# Patient Record
Sex: Male | Born: 1945 | Race: Black or African American | Hispanic: No | Smoking: Former smoker
Health system: Southern US, Community
[De-identification: ages and names within clinical notes are randomized; demographics above are authoritative.]

## PROBLEM LIST (undated history)

## (undated) ENCOUNTER — Emergency Department (HOSPITAL_COMMUNITY): Admission: EM | Payer: Medicare Other | Source: Home / Self Care

## (undated) DIAGNOSIS — E119 Type 2 diabetes mellitus without complications: Secondary | ICD-10-CM

## (undated) DIAGNOSIS — B1910 Unspecified viral hepatitis B without hepatic coma: Secondary | ICD-10-CM

## (undated) DIAGNOSIS — E785 Hyperlipidemia, unspecified: Secondary | ICD-10-CM

## (undated) DIAGNOSIS — R0600 Dyspnea, unspecified: Secondary | ICD-10-CM

## (undated) DIAGNOSIS — G4730 Sleep apnea, unspecified: Secondary | ICD-10-CM

## (undated) DIAGNOSIS — G47419 Narcolepsy without cataplexy: Secondary | ICD-10-CM

## (undated) DIAGNOSIS — I499 Cardiac arrhythmia, unspecified: Secondary | ICD-10-CM

## (undated) DIAGNOSIS — I10 Essential (primary) hypertension: Secondary | ICD-10-CM

## (undated) DIAGNOSIS — C801 Malignant (primary) neoplasm, unspecified: Secondary | ICD-10-CM

## (undated) HISTORY — DX: Hyperlipidemia, unspecified: E78.5

## (undated) HISTORY — DX: Type 2 diabetes mellitus without complications: E11.9

## (undated) HISTORY — PX: OTHER SURGICAL HISTORY: SHX169

## (undated) HISTORY — DX: Essential (primary) hypertension: I10

---

## 2001-06-10 ENCOUNTER — Emergency Department (HOSPITAL_COMMUNITY): Admission: EM | Admit: 2001-06-10 | Discharge: 2001-06-10 | Payer: Self-pay | Admitting: Emergency Medicine

## 2001-06-10 ENCOUNTER — Encounter: Payer: Self-pay | Admitting: Emergency Medicine

## 2001-09-09 ENCOUNTER — Encounter: Payer: Self-pay | Admitting: Family Medicine

## 2001-09-09 ENCOUNTER — Encounter: Admission: RE | Admit: 2001-09-09 | Discharge: 2001-09-09 | Payer: Self-pay | Admitting: Family Medicine

## 2002-08-18 ENCOUNTER — Ambulatory Visit (HOSPITAL_BASED_OUTPATIENT_CLINIC_OR_DEPARTMENT_OTHER): Admission: RE | Admit: 2002-08-18 | Discharge: 2002-08-18 | Payer: Self-pay | Admitting: Family Medicine

## 2002-09-06 ENCOUNTER — Ambulatory Visit: Admission: RE | Admit: 2002-09-06 | Discharge: 2002-09-06 | Payer: Self-pay | Admitting: Family Medicine

## 2002-11-02 ENCOUNTER — Emergency Department (HOSPITAL_COMMUNITY): Admission: EM | Admit: 2002-11-02 | Discharge: 2002-11-03 | Payer: Self-pay | Admitting: Emergency Medicine

## 2002-11-03 ENCOUNTER — Encounter: Payer: Self-pay | Admitting: Emergency Medicine

## 2003-08-04 ENCOUNTER — Ambulatory Visit (HOSPITAL_BASED_OUTPATIENT_CLINIC_OR_DEPARTMENT_OTHER): Admission: RE | Admit: 2003-08-04 | Discharge: 2003-08-04 | Payer: Self-pay | Admitting: Neurology

## 2003-08-25 ENCOUNTER — Ambulatory Visit (HOSPITAL_COMMUNITY): Admission: RE | Admit: 2003-08-25 | Discharge: 2003-08-25 | Payer: Self-pay | Admitting: Gastroenterology

## 2003-08-25 ENCOUNTER — Encounter (INDEPENDENT_AMBULATORY_CARE_PROVIDER_SITE_OTHER): Payer: Self-pay | Admitting: *Deleted

## 2005-02-02 ENCOUNTER — Encounter: Admission: RE | Admit: 2005-02-02 | Discharge: 2005-02-02 | Payer: Self-pay | Admitting: Neurology

## 2008-10-18 ENCOUNTER — Encounter: Admission: RE | Admit: 2008-10-18 | Discharge: 2008-11-02 | Payer: Self-pay | Admitting: Family Medicine

## 2010-11-10 ENCOUNTER — Other Ambulatory Visit: Payer: Self-pay | Admitting: Gastroenterology

## 2011-01-05 NOTE — Op Note (Signed)
NAME:  [name], [name]                     ACCOUNT NO.:  [id number]   MEDICAL RECORD NO.:  [id number]                   PATIENT TYPE:  AMB   LOCATION:  ENDO                                 FACILITY:  [facility]   PHYSICIAN:  Petra Kuba, M.D.                 DATE OF BIRTH:  1945-10-13   DATE OF PROCEDURE:  08/25/2003  DATE OF DISCHARGE:                                 OPERATIVE REPORT   PROCEDURE PERFORMED:  Colonoscopy with hot biopsy.   ENDOSCOPIST:  Petra Kuba, M.D.   INDICATIONS FOR PROCEDURE:  Patient with history of colon polyps, due for  colon screening.  Consent was signed after the risks, benefits, methods and  options were thoroughly discussed multiple times in the past.   MEDICINES USED:  Demerol 75 mg, Versed 6 mg.   DESCRIPTION OF PROCEDURE:  Rectal inspection was pertinent for external  hemorrhoids, small.  Digital exam was negative.  A video colonoscope was  inserted.  The left side prep was adequate.  The right side prep was fairly  poor and did require about 1.6 L of washing and suctioning for adequate  visualization.  Although there was multiple  stool adherent to the wall,  most could be washed away, but not all.  On insertion in the midtransverse,  a small polyp was seen and was hot biopsied times one.  No other  abnormalities were seen as we advanced to the level of the ileocecal valve.  This required just abdominal pressure; however, to advance to the cecal pole  required rolling him on his back and then rolling him on his right side with  various abdominal pressures.  The cecum was identified by the appendiceal  orifice and the ileocecal valve.  The scope was slowly withdrawn.  Again,  prep on the right side was not as good as the left but with lots of washing  and suctioning, no obvious large lesion was missed.  Certainly a tiny polyp  could have been.  As the scope was slowly withdrawn, the previously hot  biopsied polypectomy site was seen.  It had a  nice white coagulum without  any bleeding or obvious residual polyp.  Scope was slowly withdrawn.  There  was some left-sided diverticula but no other abnormalities as we slowly  withdrew back to the rectum.  Anorectal pull through and retroflexion  confirmed the hemorrhoids.  The scope was reinserted a short ways up the  left side of the colon, air was suctioned, scope removed.  The patient  tolerated the procedure well.  There was no immediate obvious complication.   ENDOSCOPIC DIAGNOSIS:  1. Internal and external hemorrhoids.  2. Left-sided diverticula.  3. Small midtransverse polyp hot biopsied.  4. Otherwise within normal limits to the cecum.   PLAN:  Await pathology, probably recheck colon screening in five years.  Would use a more aggressive prep at that time.  Otherwise return care  to Dr.  Parke Simmers for the customary health care maintenance to include yearly rectals  and guaiacs.                                               Petra Kuba, M.D.    MEM/MEDQ  D:  08/25/2003  T:  08/25/2003  Job:  [id number]   cc:   Renaye Rakers, M.D.  [phone] [address]  [place]  [place] [address]  Fax: [phone]   Clinton D. Young, M.D.  [address] [place]  [place] [address]  Fax: [phone]

## 2011-08-03 ENCOUNTER — Ambulatory Visit (INDEPENDENT_AMBULATORY_CARE_PROVIDER_SITE_OTHER): Payer: MEDICARE | Admitting: Family Medicine

## 2011-08-03 DIAGNOSIS — I10 Essential (primary) hypertension: Secondary | ICD-10-CM

## 2011-08-03 DIAGNOSIS — Z23 Encounter for immunization: Secondary | ICD-10-CM

## 2011-08-08 ENCOUNTER — Ambulatory Visit (INDEPENDENT_AMBULATORY_CARE_PROVIDER_SITE_OTHER): Payer: MEDICARE

## 2011-08-08 DIAGNOSIS — L0211 Cutaneous abscess of neck: Secondary | ICD-10-CM

## 2011-08-08 DIAGNOSIS — M79609 Pain in unspecified limb: Secondary | ICD-10-CM

## 2011-08-10 ENCOUNTER — Ambulatory Visit (INDEPENDENT_AMBULATORY_CARE_PROVIDER_SITE_OTHER): Payer: MEDICARE

## 2011-08-10 DIAGNOSIS — L02219 Cutaneous abscess of trunk, unspecified: Secondary | ICD-10-CM

## 2011-08-12 ENCOUNTER — Ambulatory Visit (INDEPENDENT_AMBULATORY_CARE_PROVIDER_SITE_OTHER): Payer: MEDICARE

## 2011-08-12 DIAGNOSIS — L0211 Cutaneous abscess of neck: Secondary | ICD-10-CM

## 2011-08-12 DIAGNOSIS — L03221 Cellulitis of neck: Secondary | ICD-10-CM

## 2011-08-15 ENCOUNTER — Ambulatory Visit (INDEPENDENT_AMBULATORY_CARE_PROVIDER_SITE_OTHER): Payer: MEDICARE

## 2011-08-15 DIAGNOSIS — L0211 Cutaneous abscess of neck: Secondary | ICD-10-CM

## 2011-08-17 ENCOUNTER — Ambulatory Visit (INDEPENDENT_AMBULATORY_CARE_PROVIDER_SITE_OTHER): Payer: MEDICARE

## 2011-08-17 DIAGNOSIS — L0211 Cutaneous abscess of neck: Secondary | ICD-10-CM

## 2011-08-19 ENCOUNTER — Ambulatory Visit (INDEPENDENT_AMBULATORY_CARE_PROVIDER_SITE_OTHER): Payer: MEDICARE

## 2011-08-19 DIAGNOSIS — L0211 Cutaneous abscess of neck: Secondary | ICD-10-CM

## 2011-09-06 ENCOUNTER — Encounter: Payer: Self-pay | Admitting: Family Medicine

## 2011-09-06 DIAGNOSIS — E119 Type 2 diabetes mellitus without complications: Secondary | ICD-10-CM | POA: Insufficient documentation

## 2011-09-06 DIAGNOSIS — N400 Enlarged prostate without lower urinary tract symptoms: Secondary | ICD-10-CM

## 2011-09-06 DIAGNOSIS — G47419 Narcolepsy without cataplexy: Secondary | ICD-10-CM | POA: Insufficient documentation

## 2011-09-06 DIAGNOSIS — IMO0001 Reserved for inherently not codable concepts without codable children: Secondary | ICD-10-CM

## 2011-09-06 DIAGNOSIS — K5730 Diverticulosis of large intestine without perforation or abscess without bleeding: Secondary | ICD-10-CM | POA: Insufficient documentation

## 2011-10-08 LAB — HM DIABETES EYE EXAM

## 2011-10-24 ENCOUNTER — Other Ambulatory Visit: Payer: Self-pay | Admitting: Family Medicine

## 2011-10-24 MED ORDER — METFORMIN HCL 1000 MG PO TABS: ORAL_TABLET | ORAL | Status: DC

## 2011-11-02 ENCOUNTER — Encounter: Payer: Self-pay | Admitting: Family Medicine

## 2011-11-02 ENCOUNTER — Ambulatory Visit (INDEPENDENT_AMBULATORY_CARE_PROVIDER_SITE_OTHER): Payer: MEDICARE | Admitting: Family Medicine

## 2011-11-02 VITALS — BP 107/71 | HR 106 | Temp 97.5°F | Resp 16 | Ht 68.5 in | Wt 209.0 lb

## 2011-11-02 DIAGNOSIS — E119 Type 2 diabetes mellitus without complications: Secondary | ICD-10-CM

## 2011-11-02 DIAGNOSIS — IMO0002 Reserved for concepts with insufficient information to code with codable children: Secondary | ICD-10-CM

## 2011-11-02 DIAGNOSIS — I10 Essential (primary) hypertension: Secondary | ICD-10-CM

## 2011-11-02 DIAGNOSIS — E1165 Type 2 diabetes mellitus with hyperglycemia: Secondary | ICD-10-CM

## 2011-11-02 DIAGNOSIS — IMO0001 Reserved for inherently not codable concepts without codable children: Secondary | ICD-10-CM

## 2011-11-02 LAB — POCT GLYCOSYLATED HEMOGLOBIN (HGB A1C): Hemoglobin A1C: 7.3

## 2011-11-02 LAB — GLUCOSE, POCT (MANUAL RESULT ENTRY): POC Glucose: 160

## 2011-11-02 MED ORDER — AMLODIPINE BESY-BENAZEPRIL HCL 5-10 MG PO CAPS: 1.0000 | ORAL_CAPSULE | Freq: Every day | ORAL | Status: DC

## 2011-11-02 MED ORDER — HYDROCHLOROTHIAZIDE 25 MG PO TABS: 25.0000 mg | ORAL_TABLET | Freq: Every day | ORAL | Status: DC

## 2011-11-02 MED ORDER — PRAVASTATIN SODIUM 20 MG PO TABS: 20.0000 mg | ORAL_TABLET | Freq: Every day | ORAL | Status: DC

## 2011-11-02 MED ORDER — METFORMIN HCL 1000 MG PO TABS: 1000.0000 mg | ORAL_TABLET | Freq: Two times a day (BID) | ORAL | Status: DC

## 2011-11-02 MED ORDER — SPIRONOLACTONE 25 MG PO TABS: 25.0000 mg | ORAL_TABLET | Freq: Two times a day (BID) | ORAL | Status: DC

## 2011-11-02 NOTE — Progress Notes (Signed)
  Subjective:    Patient ID: [name], male    DOB: 09-14-1945, 66 y.o.   MRN: [id number]  HPI [name] is a 66 y.o. male here for f/u on meds, Hx narcolepsy, HTN, DM2   DM2 - last A1c 7.9 08/03/11.  Metformin increased to 500mg  qam, 1 gram qpm.  Microalbumin wnl last ov.  Rarely checking home blood sugars.  Fasting 103-127.  No new side effects with meds, no symptomatic low cbg's  Htn - outside bp's 127/78-81 No new side effects with meds.   Review of Systems  Constitutional: Negative for fatigue and unexpected weight change.  Eyes: Negative for visual disturbance.  Respiratory: Negative for cough, chest tightness and shortness of breath.   Cardiovascular: Negative for chest pain, palpitations and leg swelling.  Gastrointestinal: Negative for abdominal pain and blood in stool.  Neurological: Negative for dizziness, light-headedness and headaches.      not fasting today. Objective:   Physical Exam  Constitutional: He is oriented to person, place, and time. He appears well-developed and well-nourished.  HENT:  Head: Normocephalic and atraumatic.  Eyes: EOM are normal. Pupils are equal, round, and reactive to light.  Neck: No JVD present. Carotid bruit is not present. No thyromegaly present.  Cardiovascular: Normal rate, regular rhythm and normal heart sounds.   No murmur heard. Pulmonary/Chest: Effort normal and breath sounds normal. He has no rales.  Abdominal: Soft. He exhibits no distension, no abdominal bruit and no pulsatile midline mass.  Musculoskeletal: He exhibits no edema.  Neurological: He is alert and oriented to person, place, and time.  Skin: Skin is warm and dry. No rash noted.  Psychiatric: He has a normal mood and affect.   Microfilament testing wnl.  Results for orders placed in visit on 11/02/11  POCT GLYCOSYLATED HEMOGLOBIN (HGB A1C)      Component Value Range   Hemoglobin A1C 7.3    GLUCOSE, POCT (MANUAL RESULT ENTRY)      Component  Value Range   POC Glucose 160          Assessment & Plan:  [name] is a 67 y.o. male 1. HTN (hypertension)  Basic metabolic panel  2. DM2 (diabetes mellitus, type 2)  POCT HgB A1C, POCT glucose (manual entry)  3. Diabetes type 2, uncontrolled     HTN - controlled, cont same meds. Refills given.  Work on diet and weight loss.  DM2- uncontrolled but improving on new regimen. Discussed med options - will increase metformin to 1 gram BID, and recheck HGB A1c in 3 months.  Follow home blood sugars, plans on obtaining meter through [employer] to save cost.  Discussed continued attention to diet choices and exercise for weight loss. Recheck in 3 months.

## 2011-11-03 LAB — BASIC METABOLIC PANEL
BUN: 15 mg/dL (ref 6–23)
CO2: 27 mEq/L (ref 19–32)
Calcium: 9.2 mg/dL (ref 8.4–10.5)
Chloride: 101 mEq/L (ref 96–112)
Creat: 1.15 mg/dL (ref 0.50–1.35)
Glucose, Bld: 136 mg/dL — ABNORMAL HIGH (ref 70–99)
Potassium: 4.3 mEq/L (ref 3.5–5.3)
Sodium: 139 mEq/L (ref 135–145)

## 2011-12-12 DIAGNOSIS — G259 Extrapyramidal and movement disorder, unspecified: Secondary | ICD-10-CM | POA: Insufficient documentation

## 2012-02-18 ENCOUNTER — Telehealth: Payer: Self-pay

## 2012-02-18 NOTE — Telephone Encounter (Signed)
DR. Neva Seat Please call Dr. Egbert Garibaldi regarding patient (medications - specifically a steroid to be considered) Phone [phone]

## 2012-02-18 NOTE — Telephone Encounter (Signed)
patient notified and voiced understanding. He said he will come in tomorrow morning.

## 2012-02-18 NOTE — Telephone Encounter (Signed)
Discussed with Dr. Egbert Garibaldi - patient sent to his office from dentist due to face swelling.  Apparently has had other episodes in past year.  Thought to be angioedema, not tooth related.  Advised to stop lotrel tonight and follow up with me in am.   Please call patient - advise that his face swelling sounds like angioedema, likely a reaction to his medicine with benazepril in it. Stop Lotrel today, can take benadryl over the counter every 4 to 6 hours today and tonight, and recheck in office tomorrow morning with me for recheck.  Can rtc tonight or to ER overnight if any throat symptoms, shortness of breath or any other worsening.

## 2012-02-18 NOTE — Telephone Encounter (Signed)
Called Dr Clemetine Marker (oral surgeon) office and asked for details about ?s for Dr Neva Seat. Spoke w/Brandon who checked pt's chart and stated that pt has a badly infected tooth that it looks like Dr Egbert Garibaldi wants to reduce the infection in bf extracting tooth. Apolinar Junes stated it would be best if Dr Neva Seat could call and discuss w/Dr Egbert Garibaldi if possible after 2 pm today.

## 2012-02-19 ENCOUNTER — Ambulatory Visit (INDEPENDENT_AMBULATORY_CARE_PROVIDER_SITE_OTHER): Payer: MEDICARE | Admitting: Family Medicine

## 2012-02-19 VITALS — BP 155/85 | HR 90 | Temp 98.2°F | Resp 17 | Ht 69.0 in | Wt 204.0 lb

## 2012-02-19 DIAGNOSIS — T783XXA Angioneurotic edema, initial encounter: Secondary | ICD-10-CM

## 2012-02-19 DIAGNOSIS — I10 Essential (primary) hypertension: Secondary | ICD-10-CM

## 2012-02-19 MED ORDER — AMLODIPINE BESYLATE 10 MG PO TABS: 10.0000 mg | ORAL_TABLET | Freq: Every day | ORAL | Status: DC

## 2012-02-19 NOTE — Progress Notes (Signed)
  Subjective:    Patient ID: [name], male    DOB: 1946-05-02, 66 y.o.   MRN: [id number]  HPI [name] is a 65 y.o. male Hx HTn, DM.  See phone note yesterday - called by patient's oral surgeon - Dr. Remigio Eisenmenger.  Swelling of face.mouth - thought to be due to angioedema.  Instructed to d/c ace inhibitor Lotrel 5/10 - instructed to d/c this.   By history - patient has had similar episodes in past - R face with swelling into lower lip.  Stopped amlodipine/benazepril yesterday (last dose yesterday am).   No shortness of breath, no throat symptoms.  Swelling has subsided - only a little bit left, no benadryl needed.   Review of Systems  HENT: Negative for sore throat and trouble swallowing.   Respiratory: Negative for cough, choking and shortness of breath.   Skin:       R face swelling.   Neurological: Negative for dizziness and light-headedness.       Objective:   Physical Exam  Constitutional: He is oriented to person, place, and time. He appears well-developed and well-nourished.  HENT:  Head: Normocephalic and atraumatic.    Eyes: EOM are normal. Pupils are equal, round, and reactive to light.  Neck: No JVD present. Carotid bruit is not present. No tracheal deviation present.  Cardiovascular: Normal rate, regular rhythm and normal heart sounds.   No murmur heard. Pulmonary/Chest: Effort normal and breath sounds normal. No stridor. No respiratory distress. He has no wheezes. He has no rales.  Musculoskeletal: He exhibits no edema.  Neurological: He is alert and oriented to person, place, and time.  Skin: Skin is warm and dry.  Psychiatric: He has a normal mood and affect.       Assessment & Plan:  [name] is a 66 y.o. male 1. HTN (hypertension)   2. Angioedema    Stop benazepril.  Increase amlodipine to 10mg .  Advised of allergy now to ACE inhibitors. Patient Instructions  Stopping benazepril as you likely had an allergic reaction called  angioedema.  You can not take any "Ace inhibitors" in the future due to this.  We will continue amlodipine (one of the medicines in the combination pill you took prior) but will increase the dose to 10mg .  If any lightheadedness or dizziness, return to clinic.  Keep your follow up appointment on the 12th.  Return to the clinic or go to the nearest emergency room if any of your symptoms worsen or new symptoms occur.

## 2012-02-19 NOTE — Patient Instructions (Signed)
Stopping benazepril as you likely had an allergic reaction called angioedema.  You can not take any "Ace inhibitors" in the future due to this.  We will continue amlodipine (one of the medicines in the combination pill you took prior) but will increase the dose to 10mg .  If any lightheadedness or dizziness, return to clinic.  Keep your follow up appointment on the 12th.  Return to the clinic or go to the nearest emergency room if any of your symptoms worsen or new symptoms occur.

## 2012-02-29 ENCOUNTER — Ambulatory Visit (INDEPENDENT_AMBULATORY_CARE_PROVIDER_SITE_OTHER): Payer: MEDICARE | Admitting: Family Medicine

## 2012-02-29 ENCOUNTER — Encounter: Payer: Self-pay | Admitting: Family Medicine

## 2012-02-29 VITALS — BP 132/82 | HR 113 | Temp 97.3°F | Resp 22 | Ht 69.0 in | Wt 203.0 lb

## 2012-02-29 DIAGNOSIS — I10 Essential (primary) hypertension: Secondary | ICD-10-CM

## 2012-02-29 DIAGNOSIS — IMO0001 Reserved for inherently not codable concepts without codable children: Secondary | ICD-10-CM

## 2012-02-29 DIAGNOSIS — E119 Type 2 diabetes mellitus without complications: Secondary | ICD-10-CM

## 2012-02-29 LAB — BASIC METABOLIC PANEL
BUN: 19 mg/dL (ref 6–23)
CO2: 23 mEq/L (ref 19–32)
Calcium: 9.5 mg/dL (ref 8.4–10.5)
Chloride: 101 mEq/L (ref 96–112)
Creat: 1.27 mg/dL (ref 0.50–1.35)
Glucose, Bld: 125 mg/dL — ABNORMAL HIGH (ref 70–99)
Potassium: 4.4 mEq/L (ref 3.5–5.3)
Sodium: 138 mEq/L (ref 135–145)

## 2012-02-29 LAB — GLUCOSE, POCT (MANUAL RESULT ENTRY): POC Glucose: 143 mg/dl — AB (ref 70–99)

## 2012-02-29 LAB — POCT GLYCOSYLATED HEMOGLOBIN (HGB A1C): Hemoglobin A1C: 6.5

## 2012-02-29 MED ORDER — LOSARTAN POTASSIUM 25 MG PO TABS: 25.0000 mg | ORAL_TABLET | Freq: Every day | ORAL | Status: DC

## 2012-02-29 NOTE — Patient Instructions (Signed)
Keep a record of your blood pressures outside of the office and bring them to the next office visit. Return to the clinic or go to the nearest emergency room if any of your symptoms worsen or new symptoms occur.

## 2012-02-29 NOTE — Progress Notes (Signed)
  Subjective:    Patient ID: [name], male    DOB: [birth year], 66 y.o.   MRN: [id number]  HPI [name] is a 66 y.o. male  DM2 - A1c 7.3 in March.  Increased metformin to 1 gram BID. cbg's 110-135. No new side effects with meds.  Did have cramp in leg few months ago.  HTN - with recent diagnosis of angioedema - ace inhibitor discontinued 7/1 with OV 7/2 - improving then. No further face or lip swelling.  No outside BP readings.    Review of Systems  Constitutional: Negative for fatigue and unexpected weight change.  Eyes: Negative for visual disturbance.  Respiratory: Negative for cough, chest tightness and shortness of breath.   Cardiovascular: Negative for chest pain, palpitations and leg swelling.  Gastrointestinal: Negative for abdominal pain and blood in stool.  Neurological: Negative for dizziness, light-headedness and headaches.       Objective:   Physical Exam  Constitutional: He is oriented to person, place, and time. He appears well-developed and well-nourished.  HENT:  Head: Normocephalic and atraumatic.       No lip or face swelling.   Eyes: Pupils are equal, round, and reactive to light.  Cardiovascular: Normal rate, regular rhythm, normal heart sounds and intact distal pulses.   Pulmonary/Chest: Effort normal and breath sounds normal.  Abdominal: Soft. There is no tenderness.  Neurological: He is alert and oriented to person, place, and time.       Microfilament testing of feet normal bilaterally.  Skin: Skin is warm, dry and intact. No rash noted.  Psychiatric: He has a normal mood and affect. His behavior is normal.    Results for orders placed in visit on 02/29/12  GLUCOSE, POCT (MANUAL RESULT ENTRY)      Component Value Range   POC Glucose 143 (*) 70 - 99 mg/dl  POCT GLYCOSYLATED HEMOGLOBIN (HGB A1C)      Component Value Range   Hemoglobin A1C 6.5           Assessment & Plan:  [name] is a 66 y.o. male 1. Diabetes  type 2, controlled  POCT glucose (manual entry), POCT glycosylated hemoglobin (Hb A1C)  2. HTN (hypertension)  Basic metabolic panel   Hx of ACe - inhibitor angioedema, will avoid this class, but can try ARB as diabetic for nephroprotection.  Check urine micralbumin, Start cozaar 25mg  qd, watch outside bp's, orhtostatic precautions.  Cautioned about possible cross reactivity of ARB - if any return of face or lip swelling - stop cozaar right away and rtc.  DM2 - controlled. Continue same doses of meds - ok to refill x 3 months if needed. no refills needed today per pt.

## 2012-03-01 LAB — MICROALBUMIN, URINE: Microalb, Ur: 1.31 mg/dL (ref 0.00–1.89)

## 2012-03-13 ENCOUNTER — Encounter: Payer: Self-pay | Admitting: Family Medicine

## 2012-03-13 DIAGNOSIS — G4733 Obstructive sleep apnea (adult) (pediatric): Secondary | ICD-10-CM | POA: Insufficient documentation

## 2012-05-30 ENCOUNTER — Telehealth: Payer: Self-pay

## 2012-05-30 MED ORDER — METFORMIN HCL 1000 MG PO TABS: 1000.0000 mg | ORAL_TABLET | Freq: Two times a day (BID) | ORAL | Status: DC

## 2012-05-30 MED ORDER — PRAVASTATIN SODIUM 20 MG PO TABS: 20.0000 mg | ORAL_TABLET | Freq: Every day | ORAL | Status: DC

## 2012-05-30 MED ORDER — SPIRONOLACTONE 25 MG PO TABS: 25.0000 mg | ORAL_TABLET | Freq: Two times a day (BID) | ORAL | Status: DC

## 2012-05-30 NOTE — Telephone Encounter (Signed)
Pt originally had appt w/dr greene today, but did not get message that appt had changed. He needs refills on spironolactone 25 mg, metformin 1000 mg, and pravastatin 20mg .  Made another appt for mon, but is out of meds today.

## 2012-05-30 NOTE — Telephone Encounter (Signed)
Please call patient and inform him that I have sent in 90 days of his medications, but he needs to keep his appointment with Dr. Neva Seat on 06/02/12.

## 2012-05-31 NOTE — Telephone Encounter (Signed)
Patient notified and stated he will be at his appt.

## 2012-06-02 ENCOUNTER — Ambulatory Visit (INDEPENDENT_AMBULATORY_CARE_PROVIDER_SITE_OTHER): Payer: MEDICARE | Admitting: Family Medicine

## 2012-06-02 ENCOUNTER — Encounter: Payer: Self-pay | Admitting: Family Medicine

## 2012-06-02 VITALS — BP 108/78 | HR 112 | Temp 98.1°F | Resp 16 | Ht 68.5 in | Wt 214.8 lb

## 2012-06-02 DIAGNOSIS — Z23 Encounter for immunization: Secondary | ICD-10-CM

## 2012-06-02 DIAGNOSIS — E119 Type 2 diabetes mellitus without complications: Secondary | ICD-10-CM

## 2012-06-02 DIAGNOSIS — I10 Essential (primary) hypertension: Secondary | ICD-10-CM

## 2012-06-02 LAB — POCT GLYCOSYLATED HEMOGLOBIN (HGB A1C): Hemoglobin A1C: 6.8

## 2012-06-02 LAB — GLUCOSE, POCT (MANUAL RESULT ENTRY): POC Glucose: 97 mg/dl (ref 70–99)

## 2012-06-02 MED ORDER — METFORMIN HCL 1000 MG PO TABS: 1000.0000 mg | ORAL_TABLET | Freq: Two times a day (BID) | ORAL | Status: DC

## 2012-06-02 MED ORDER — PRAVASTATIN SODIUM 20 MG PO TABS: 20.0000 mg | ORAL_TABLET | Freq: Every day | ORAL | Status: DC

## 2012-06-02 MED ORDER — SPIRONOLACTONE 25 MG PO TABS: 25.0000 mg | ORAL_TABLET | Freq: Two times a day (BID) | ORAL | Status: DC

## 2012-06-02 MED ORDER — HYDROCHLOROTHIAZIDE 12.5 MG PO TABS: 12.5000 mg | ORAL_TABLET | Freq: Every day | ORAL | Status: DC

## 2012-06-02 NOTE — Patient Instructions (Signed)
Keep a record of your blood pressures outside of the office and bring them to the next office visit. We decreased the hydrochlorothiazide to 12.5mg  each day. (you can split the 25mg  dose in half until this new prescription is filled). [facility] for your shingles vaccine - bring the prescription.  Recheck in 3 months. Your should receive a call or letter about your lab results within the next week to 10 days.

## 2012-06-02 NOTE — Progress Notes (Signed)
Subjective:    Patient ID: [name], male    DOB: 29-Mar-1946, 66 y.o.   MRN: [id number]  HPI [name] is a 66 y.o. male  Phone note reviewed from 05/30/12 - metformin, pravastatin and spirinolactone refilled x 90days.   HTN - with hx of ACE - inhibitor angioedema, Started cozaar 25mg  qd at 02/29/12 ov. Creatinine 1.27 then. Also takes spirinolactone 25mg  bid, hctz 25mg  qd, norvasc 10mg  qd.  No further face swelling.  Outside BP: 105-110/68-75.    DM2 - controlled at last ov with A1c 6.5 last ov. Continued metformin 1000mg  BID. Home blood sugars - 150's on average. Lowest  98, no symptomatic lows. Dentist appt in next week. Eye appt few months ago.  No concerns.   Feeling well otherwise.   Occasional trouble hearing for years.  Has had evaluated in past. . Unable to afford hearing aids at this   Review of Systems  Constitutional: Negative for fatigue and unexpected weight change.  Eyes: Negative for visual disturbance.  Respiratory: Negative for cough, chest tightness and shortness of breath.   Cardiovascular: Negative for chest pain, palpitations and leg swelling.  Gastrointestinal: Negative for abdominal pain and blood in stool.  Neurological: Positive for dizziness (only if stooping down for a period of time and then standing again. ). Negative for light-headedness and headaches.       Objective:   Physical Exam  Constitutional: He is oriented to person, place, and time. He appears well-developed and well-nourished.  HENT:  Head: Normocephalic and atraumatic.  Right Ear: Decreased hearing is noted.  Left Ear: Decreased hearing is noted.  Eyes: Pupils are equal, round, and reactive to light.  Cardiovascular: Normal rate, regular rhythm, normal heart sounds and intact distal pulses.   Pulmonary/Chest: Effort normal and breath sounds normal.  Abdominal: Soft. There is no tenderness.  Neurological: He is alert and oriented to person, place, and time.   Microfilament testing of feet normal bilaterally.  Skin: Skin is warm, dry and intact. No rash noted.  Psychiatric: He has a normal mood and affect. His behavior is normal.      Results for orders placed in visit on 06/02/12  GLUCOSE, POCT (MANUAL RESULT ENTRY)      Component Value Range   POC Glucose 97  70 - 99 mg/dl  POCT GLYCOSYLATED HEMOGLOBIN (HGB A1C)      Component Value Range   Hemoglobin A1C 6.8         Assessment & Plan:  [name] is a 66 y.o. male 1. DM type 2 (diabetes mellitus, type 2)  POCT glucose (manual entry), POCT glycosylated hemoglobin (Hb A1C)  2. HTN (hypertension)  Basic metabolic panel   HTN - rare orthostatic sx's with tight control by hx.  Will decrease hctz to 12.5mg  qd. Can split current ones until new rx. Bmp pending.   DM2 - controlled. Continue same meds. Recheck in 3 months.   Flu shot given. Pneumovax given.  Paper rx given for zostavax - can have done at [facility].   Hearing difficulty - discussed hearing aids - declined for now due to cost.   Patient Instructions  Keep a record of your blood pressures outside of the office and bring them to the next office visit. We decreased the hydrochlorothiazide to 12.5mg  each day. (you can split the 25mg  dose in half until this new prescription is filled). [facility] for your shingles vaccine - bring the prescription.  Recheck in  3 months. Your should receive a call or letter about your lab results within the next week to 10 days.

## 2012-06-03 LAB — BASIC METABOLIC PANEL
BUN: 12 mg/dL (ref 6–23)
CO2: 25 mEq/L (ref 19–32)
Calcium: 9.5 mg/dL (ref 8.4–10.5)
Chloride: 102 mEq/L (ref 96–112)
Creat: 1.15 mg/dL (ref 0.50–1.35)
Glucose, Bld: 95 mg/dL (ref 70–99)
Potassium: 3.7 mEq/L (ref 3.5–5.3)
Sodium: 137 mEq/L (ref 135–145)

## 2012-06-18 ENCOUNTER — Ambulatory Visit (INDEPENDENT_AMBULATORY_CARE_PROVIDER_SITE_OTHER): Payer: MEDICARE | Admitting: Family Medicine

## 2012-06-18 VITALS — BP 109/71 | HR 103 | Temp 98.7°F | Resp 16 | Ht 68.5 in | Wt 214.0 lb

## 2012-06-18 DIAGNOSIS — L723 Sebaceous cyst: Secondary | ICD-10-CM

## 2012-06-18 DIAGNOSIS — M25519 Pain in unspecified shoulder: Secondary | ICD-10-CM

## 2012-06-18 DIAGNOSIS — L720 Epidermal cyst: Secondary | ICD-10-CM

## 2012-06-18 MED ORDER — KETOROLAC TROMETHAMINE 30 MG/ML IJ SOLN: 30.0000 mg | Freq: Once | INTRAMUSCULAR | Status: DC

## 2012-06-18 NOTE — Patient Instructions (Signed)

## 2012-06-18 NOTE — Assessment & Plan Note (Addendum)
Patient had significant amount of discharge at last visit. This occurred on June 18, 2012. No signs of infection at this time. Patient will return if for some reason it starts to enlarge and cause him pain again. Patient was given injection of Kenalog today as well as a handout on epidermal cyst. Patient has no signs of infection today so no medications such as antibiotics were given to him.

## 2012-06-18 NOTE — Progress Notes (Signed)
Patient is a 66 year old male coming in with a back lump. Patient states that he's had this for multiple years but over the course of last 2 days it started to hurt. Patient states that he does express some discharge from it previously. Vision states at this time though he is trying to do it himself and was on able to do it. Patient states that it has grown in size over the course of the last couple months. Patient denies any fevers or chills denies any numbness in the gym he denies any radiation of pain down his back or down his hypertrophy. Patient states that this bump is on his back right side mostly the scapula. Patient found it very difficult to sleep secondary to this mass. Patient has not had it seen or evaluated previously.  Past medical history, social, surgical and family history all reviewed.   Denies fever, chills, nausea vomiting abdominal pain, dysuria, chest pain, shortness of breath dyspnea on exertion or numbness in extremities  Physical exam Blood pressure 109/71, pulse 103, temperature 98.7 F (37.1 C), resp. rate 16, height 5' 8.5" (1.74 m), weight 214 lb (97.07 kg). 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 This does have a large for the does have creamy-like substance that is foul-smelling able to be expressed. Patient had significant amounts expressed with pressure and decrease the size to minimally palpable underneath the skin layer. This did take greater than 15 minutes. Patient did not want me to open up and tried to express the fact today. Patient did tolerate the procedure well

## 2012-09-08 ENCOUNTER — Encounter: Payer: Self-pay | Admitting: Family Medicine

## 2012-09-08 ENCOUNTER — Ambulatory Visit (INDEPENDENT_AMBULATORY_CARE_PROVIDER_SITE_OTHER): Payer: Medicare Other | Admitting: Family Medicine

## 2012-09-08 VITALS — BP 134/62 | HR 104 | Temp 98.4°F | Resp 16 | Ht 69.0 in | Wt 217.4 lb

## 2012-09-08 DIAGNOSIS — I10 Essential (primary) hypertension: Secondary | ICD-10-CM

## 2012-09-08 DIAGNOSIS — E119 Type 2 diabetes mellitus without complications: Secondary | ICD-10-CM

## 2012-09-08 DIAGNOSIS — E1165 Type 2 diabetes mellitus with hyperglycemia: Secondary | ICD-10-CM

## 2012-09-08 DIAGNOSIS — IMO0001 Reserved for inherently not codable concepts without codable children: Secondary | ICD-10-CM

## 2012-09-08 DIAGNOSIS — G4700 Insomnia, unspecified: Secondary | ICD-10-CM

## 2012-09-08 DIAGNOSIS — G4733 Obstructive sleep apnea (adult) (pediatric): Secondary | ICD-10-CM

## 2012-09-08 DIAGNOSIS — E785 Hyperlipidemia, unspecified: Secondary | ICD-10-CM

## 2012-09-08 LAB — COMPREHENSIVE METABOLIC PANEL
ALT: 39 U/L (ref 0–53)
AST: 33 U/L (ref 0–37)
Albumin: 4.2 g/dL (ref 3.5–5.2)
Alkaline Phosphatase: 74 U/L (ref 39–117)
BUN: 14 mg/dL (ref 6–23)
CO2: 23 mEq/L (ref 19–32)
Calcium: 9.4 mg/dL (ref 8.4–10.5)
Chloride: 98 mEq/L (ref 96–112)
Creat: 1.17 mg/dL (ref 0.50–1.35)
Glucose, Bld: 202 mg/dL — ABNORMAL HIGH (ref 70–99)
Potassium: 4.1 mEq/L (ref 3.5–5.3)
Sodium: 134 mEq/L — ABNORMAL LOW (ref 135–145)
Total Bilirubin: 0.4 mg/dL (ref 0.3–1.2)
Total Protein: 7.7 g/dL (ref 6.0–8.3)

## 2012-09-08 LAB — LIPID PANEL
Cholesterol: 162 mg/dL (ref 0–200)
HDL: 42 mg/dL (ref >39)
LDL Cholesterol: 89 mg/dL (ref 0–99)
Total CHOL/HDL Ratio: 3.9 Ratio
Triglycerides: 157 mg/dL — ABNORMAL HIGH (ref <150)
VLDL: 31 mg/dL (ref 0–40)

## 2012-09-08 LAB — GLUCOSE, POCT (MANUAL RESULT ENTRY): POC Glucose: 214 mg/dl — AB (ref 70–99)

## 2012-09-08 LAB — POCT GLYCOSYLATED HEMOGLOBIN (HGB A1C): Hemoglobin A1C: 9.5

## 2012-09-08 MED ORDER — PRAVASTATIN SODIUM 20 MG PO TABS: 20.0000 mg | ORAL_TABLET | Freq: Every day | ORAL | Status: DC

## 2012-09-08 MED ORDER — AMLODIPINE BESYLATE 10 MG PO TABS: 10.0000 mg | ORAL_TABLET | Freq: Every day | ORAL | Status: DC

## 2012-09-08 MED ORDER — METFORMIN HCL 1000 MG PO TABS: 1000.0000 mg | ORAL_TABLET | Freq: Two times a day (BID) | ORAL | Status: DC

## 2012-09-08 MED ORDER — SPIRONOLACTONE 25 MG PO TABS: 25.0000 mg | ORAL_TABLET | Freq: Two times a day (BID) | ORAL | Status: DC

## 2012-09-08 MED ORDER — GLIPIZIDE 2.5 MG HALF TABLET: 2.5000 mg | ORAL_TABLET | Freq: Every day | ORAL | Status: DC

## 2012-09-08 MED ORDER — HYDROCHLOROTHIAZIDE 12.5 MG PO TABS: 12.5000 mg | ORAL_TABLET | Freq: Every day | ORAL | Status: DC

## 2012-09-08 MED ORDER — LOSARTAN POTASSIUM 25 MG PO TABS: 25.0000 mg | ORAL_TABLET | Freq: Every day | ORAL | Status: DC

## 2012-09-08 NOTE — Progress Notes (Signed)
Subjective:    Patient ID: [name], male    DOB: July 30, 1946, 67 y.o.   MRN: [id number]  HPI [name] is a 67 y.o. male Here for follow up.  HTN - rare orthostatic sx's with tight control by hx at 1-/14/13 ov.   Decreased hctz to 12.5mg  qd.  Outside BP's: 127/64.  Only notes slight dizziness if bending forward. drinking fluids. Rare cramp in the leg when sitting.  No recent worsening.   Results for orders placed in visit on 06/02/12  GLUCOSE, POCT (MANUAL RESULT ENTRY)      Component Value Range   POC Glucose 97  70 - 99 mg/dl  POCT GLYCOSYLATED HEMOGLOBIN (HGB A1C)      Component Value Range   Hemoglobin A1C 6.8    BASIC METABOLIC PANEL      Component Value Range   Sodium 137  135 - 145 mEq/L   Potassium 3.7  3.5 - 5.3 mEq/L   Chloride 102  96 - 112 mEq/L   CO2 25  19 - 32 mEq/L   Glucose, Bld 95  70 - 99 mg/dL   BUN 12  6 - 23 mg/dL   Creat 4.09  8.11 - 9.14 mg/dL   Calcium 9.5  8.4 - 78.2 mg/dL   Hx of OSA - not on CPAP at last sleep/neuro eval d/t cost of machine? [facility] was managing this.  Had been charged each month.  Plans on following up with prescribing office.   Hyperlipidemia - fasting today except for water this morning. No new myalgias, or intolerances to pravastatin. Ran out of pravastatin 1 week ago. Otherwise took every night.   DM2 - controlled last ov with A1C 6.8. Continued same meds.  Home blood sugars - 137 - not chcking frequently.   Flu shot given, Pneumovax given last ov, and paper rx given for zostavax given then.   Hearing difficulty - discussed hearing aids last ov - declined due to cost. Still wants to hold off on hearing test - plans on this later as dental work is costly.    Review of Systems  Constitutional: Negative for fatigue and unexpected weight change.  Eyes: Negative for visual disturbance.  Respiratory: Negative for cough, chest tightness and shortness of breath.   Cardiovascular: Negative for chest  pain, palpitations and leg swelling.  Gastrointestinal: Negative for abdominal pain and blood in stool.  Neurological: Negative for dizziness, light-headedness and headaches.  All other systems reviewed and are negative.       Objective:   Physical Exam  Vitals reviewed. Constitutional: He is oriented to person, place, and time. He appears well-developed and well-nourished.  HENT:  Head: Normocephalic and atraumatic.  Eyes: Pupils are equal, round, and reactive to light.  Cardiovascular: Normal rate, regular rhythm, normal heart sounds and intact distal pulses.   Pulmonary/Chest: Effort normal and breath sounds normal.  Abdominal: Soft. There is no tenderness.  Neurological: He is alert and oriented to person, place, and time.       Microfilament testing of feet normal bilaterally.  Skin: Skin is warm, dry and intact. No rash noted.  Psychiatric: He has a normal mood and affect. His behavior is normal.   Results for orders placed in visit on 09/08/12  POCT GLYCOSYLATED HEMOGLOBIN (HGB A1C)      Component Value Range   Hemoglobin A1C 9.5    GLUCOSE, POCT (MANUAL RESULT ENTRY)      Component Value Range   POC  Glucose 214 (*) 70 - 99 mg/dl        Assessment & Plan:   [name] is a 67 y.o. male  1. DM2 (diabetes mellitus, type 2)  POCT glycosylated hemoglobin (Hb A1C), POCT glucose (manual entry), Comprehensive metabolic panel, Lipid panel  2. HTN (hypertension)  Lipid panel  3. Hyperlipidemia  Comprehensive metabolic panel, Lipid panel   Dm2- big decrease in control as above.  States he only missed few doses here and there, but did not adhere to diet over the holidays. Will work on diet, add glipizide 2.5mg , hypoglycemic precautions as below.   OSA - off CPAP due to cost? - encouraged to call prescriber to determine if other options would be more cost effective.   HTn - controlled. - rare orhostatic sx and cramp - increase po fluids and orthostatic precautions  discussed.  No changes in meds at this point.   Hyperlipidemia - check labs above.  meds refilled, but in review should have had another refill.  Recheck in 3 months.   Patient Instructions  Continue your same medicines, but we will add glipizide - 1/2 pill per day.  Keep a record of your blood pressures and blood sugars outside of the office and bring them to the next office visit.   Read precautions below about low blood sugar and if this occurs, stop glipizide and recheck.  Follow up in 1 month.  Your should receive a call or letter about your lab results within the next week to 10 days.  Return to the clinic or go to the nearest emergency room if any of your symptoms worsen or new symptoms occur.   Hypoglycemia (Low Blood Sugar) Hypoglycemia is when the glucose (sugar) in your blood is too low. Hypoglycemia can happen for many reasons. It can happen to people with or without diabetes. Hypoglycemia can develop quickly and can be a medical emergency.  CAUSES  Having hypoglycemia does not mean that you will develop diabetes. Different causes include:  Missed or delayed meals or not enough carbohydrates eaten.  Medication overdose. This could be by accident or deliberate. If by accident, your medication may need to be adjusted or changed.  Exercise or increased activity without adjustments in carbohydrates or medications.  A nerve disorder that affects body functions like your heart rate, blood pressure and digestion (autonomic neuropathy).  A condition where the stomach muscles do not function properly (gastroparesis). Therefore, medications may not absorb properly.  The inability to recognize the signs of hypoglycemia (hypoglycemic unawareness).  Absorption of insulin  may be altered.  Alcohol consumption.  Pregnancy/menstrual cycles/postpartum. This may be due to hormones.  Certain kinds of tumors. This is very rare. SYMPTOMS   Sweating.  Hunger.  Dizziness.  Blurred  vision.  Drowsiness.  Weakness.  Headache.  Rapid heart beat.  Shakiness.  Nervousness. DIAGNOSIS  Diagnosis is made by monitoring blood glucose in one or all of the following ways:  Fingerstick blood glucose monitoring.  Laboratory results. TREATMENT  If you think your blood glucose is low:  Check your blood glucose, if possible. If it is less than 70 mg/dl, take one of the following:  3-4 glucose tablets.   cup juice (prefer clear like apple).   cup "regular" soda pop.  1 cup milk.  -1 tube of glucose gel.  5-6 hard candies.  Do not over treat because your blood glucose (sugar) will only go too high.  Wait 15 minutes and recheck your blood glucose. If  it is still less than 70 mg/dl (or below your target range), repeat treatment.  Eat a snack if it is more than one hour until your next meal. Sometimes, your blood glucose may go so low that you are unable to treat yourself. You may need someone to help you. You may even pass out or be unable to swallow. This may require you to get an injection of glucagon, which raises the blood glucose. HOME CARE INSTRUCTIONS  Check blood glucose as recommended by your caregiver.  Take medication as prescribed by your caregiver.  Follow your meal plan. Do not skip meals. Eat on time.  If you are going to drink alcohol, drink it only with meals.  Check your blood glucose before driving.  Check your blood glucose before and after exercise. If you exercise longer or different than usual, be sure to check blood glucose more frequently.  Always carry treatment with you. Glucose tablets are the easiest to carry.  Always wear medical alert jewelry or carry some form of identification that states that you have diabetes. This will alert people that you have diabetes. If you have hypoglycemia, they will have a better idea on what to do. SEEK MEDICAL CARE IF:   You are having problems keeping your blood sugar at target  range.  You are having frequent episodes of hypoglycemia.  You feel you might be having side effects from your medicines.  You have symptoms of an illness that is not improving after 3-4 days.  You notice a change in vision or a new problem with your vision. SEEK IMMEDIATE MEDICAL CARE IF:   You are a family member or friend of a person whose blood glucose goes below 70 mg/dl and is accompanied by:  Confusion.  A change in mental status.  The inability to swallow.  Passing out. Document Released: 08/06/2005 Document Revised: 10/29/2011 Document Reviewed: 12/03/2011 [facility] Patient Information 2013 [place], [place].

## 2012-09-08 NOTE — Patient Instructions (Signed)
Continue your same medicines, but we will add glipizide - 1/2 pill per day.  Keep a record of your blood pressures and blood sugars outside of the office and bring them to the next office visit.   Read precautions below about low blood sugar and if this occurs, stop glipizide and recheck.  Follow up in 1 month.  Your should receive a call or letter about your lab results within the next week to 10 days.  Return to the clinic or go to the nearest emergency room if any of your symptoms worsen or new symptoms occur.   Hypoglycemia (Low Blood Sugar) Hypoglycemia is when the glucose (sugar) in your blood is too low. Hypoglycemia can happen for many reasons. It can happen to people with or without diabetes. Hypoglycemia can develop quickly and can be a medical emergency.  CAUSES  Having hypoglycemia does not mean that you will develop diabetes. Different causes include:  Missed or delayed meals or not enough carbohydrates eaten.  Medication overdose. This could be by accident or deliberate. If by accident, your medication may need to be adjusted or changed.  Exercise or increased activity without adjustments in carbohydrates or medications.  A nerve disorder that affects body functions like your heart rate, blood pressure and digestion (autonomic neuropathy).  A condition where the stomach muscles do not function properly (gastroparesis). Therefore, medications may not absorb properly.  The inability to recognize the signs of hypoglycemia (hypoglycemic unawareness).  Absorption of insulin  may be altered.  Alcohol consumption.  Pregnancy/menstrual cycles/postpartum. This may be due to hormones.  Certain kinds of tumors. This is very rare. SYMPTOMS   Sweating.  Hunger.  Dizziness.  Blurred vision.  Drowsiness.  Weakness.  Headache.  Rapid heart beat.  Shakiness.  Nervousness. DIAGNOSIS  Diagnosis is made by monitoring blood glucose in one or all of the following  ways:  Fingerstick blood glucose monitoring.  Laboratory results. TREATMENT  If you think your blood glucose is low:  Check your blood glucose, if possible. If it is less than 70 mg/dl, take one of the following:  3-4 glucose tablets.   cup juice (prefer clear like apple).   cup "regular" soda pop.  1 cup milk.  -1 tube of glucose gel.  5-6 hard candies.  Do not over treat because your blood glucose (sugar) will only go too high.  Wait 15 minutes and recheck your blood glucose. If it is still less than 70 mg/dl (or below your target range), repeat treatment.  Eat a snack if it is more than one hour until your next meal. Sometimes, your blood glucose may go so low that you are unable to treat yourself. You may need someone to help you. You may even pass out or be unable to swallow. This may require you to get an injection of glucagon, which raises the blood glucose. HOME CARE INSTRUCTIONS  Check blood glucose as recommended by your caregiver.  Take medication as prescribed by your caregiver.  Follow your meal plan. Do not skip meals. Eat on time.  If you are going to drink alcohol, drink it only with meals.  Check your blood glucose before driving.  Check your blood glucose before and after exercise. If you exercise longer or different than usual, be sure to check blood glucose more frequently.  Always carry treatment with you. Glucose tablets are the easiest to carry.  Always wear medical alert jewelry or carry some form of identification that states that you have diabetes.  This will alert people that you have diabetes. If you have hypoglycemia, they will have a better idea on what to do. SEEK MEDICAL CARE IF:   You are having problems keeping your blood sugar at target range.  You are having frequent episodes of hypoglycemia.  You feel you might be having side effects from your medicines.  You have symptoms of an illness that is not improving after 3-4  days.  You notice a change in vision or a new problem with your vision. SEEK IMMEDIATE MEDICAL CARE IF:   You are a family member or friend of a person whose blood glucose goes below 70 mg/dl and is accompanied by:  Confusion.  A change in mental status.  The inability to swallow.  Passing out. Document Released: 08/06/2005 Document Revised: 10/29/2011 Document Reviewed: 12/03/2011 [facility] Patient Information 2013 [place], [place].

## 2012-09-10 ENCOUNTER — Telehealth: Payer: Self-pay

## 2012-09-10 NOTE — Telephone Encounter (Signed)
Dr. Neva Seat: Patient called to change his appt. From March to February per your discussion. He wanted to speak to you also about a question you brought up at his ov this week. Please call him at [phone]

## 2012-09-11 NOTE — Telephone Encounter (Signed)
[name], he states he has been having some occasional abdominal pain. Not constant. He states he does not feel like this is from the change in his medications, the pain is easing. He states his AVS indicated if he had any pain to call. Please advise, I told him he will most likely need to return to clinic for this, but he states he wants Dr Neva Seat to be aware, and to advise. He is advised if it becomes more constant or severe to return, or go to ER< he agrees to this, however he is asking for message to be sent to Dr Neva Seat only.

## 2012-09-11 NOTE — Telephone Encounter (Signed)
Noted. Agree with instructions as pain improving.  Orthostatic and hypoglycemic precautions were given.

## 2012-10-13 ENCOUNTER — Encounter: Payer: Self-pay | Admitting: Family Medicine

## 2012-10-13 ENCOUNTER — Ambulatory Visit (INDEPENDENT_AMBULATORY_CARE_PROVIDER_SITE_OTHER): Payer: MEDICARE | Admitting: Family Medicine

## 2012-10-13 VITALS — BP 118/72 | HR 98 | Temp 97.4°F | Resp 20 | Ht 69.0 in | Wt 219.6 lb

## 2012-10-13 DIAGNOSIS — E119 Type 2 diabetes mellitus without complications: Secondary | ICD-10-CM

## 2012-10-13 DIAGNOSIS — M542 Cervicalgia: Secondary | ICD-10-CM

## 2012-10-13 DIAGNOSIS — E785 Hyperlipidemia, unspecified: Secondary | ICD-10-CM

## 2012-10-13 NOTE — Progress Notes (Signed)
Subjective:    Patient ID: [name], male    DOB: [birth year], 67 y.o.   MRN: [id number]  HPI [name] is a 67 y.o. male Last office visit 09/08/12.   DM2- decreased control at last ov - Aic 6.8 to 9.5 since 06/02/12. Some decrease in diet control, and few missed metformin doses, but added glipizide 2.5mg  qd, with hypoglycemic precautions.  Optho visit today - told was ok.  Home blood sugars - 130 this am, but did not bring in record. No symtomatic lows, but only checking blood sugar in the morning. Taking metformin BID and glipizide before supper. Has been eating better. Planning on walking 3 times per week for exercise - with girlfriend.   Occasional soreness in turning neck - only noticed this after girlfriend had stroke recently - and she had blockages in arteries. He has not had lightheadedness, dizziness or new weakness.   Abdominal pain - see phone notes. Resolved now.   OSA - off CPAP due to cost at last ov - encouraged to call prescriber to determine if other options would be more cost effective. Has not called them yet - has appt in 2 weeks.   Hyperlipidemia - remained on pravachol, same dose after last ov. Labs from prior ov:  Results for orders placed in visit on 09/08/12  COMPREHENSIVE METABOLIC PANEL      Result Value Range   Sodium 134 (*) 135 - 145 mEq/L   Potassium 4.1  3.5 - 5.3 mEq/L   Chloride 98  96 - 112 mEq/L   CO2 23  19 - 32 mEq/L   Glucose, Bld 202 (*) 70 - 99 mg/dL   BUN 14  6 - 23 mg/dL   Creat 4.09  8.11 - 9.14 mg/dL   Total Bilirubin 0.4  0.3 - 1.2 mg/dL   Alkaline Phosphatase 74  39 - 117 U/L   AST 33  0 - 37 U/L   ALT 39  0 - 53 U/L   Total Protein 7.7  6.0 - 8.3 g/dL   Albumin 4.2  3.5 - 5.2 g/dL   Calcium 9.4  8.4 - 78.2 mg/dL  LIPID PANEL      Result Value Range   Cholesterol 162  0 - 200 mg/dL   Triglycerides 956 (*) <150 mg/dL   HDL 42  >21 mg/dL   Total CHOL/HDL Ratio 3.9     VLDL 31  0 - 40 mg/dL   LDL Cholesterol 89   0 - 99 mg/dL  POCT GLYCOSYLATED HEMOGLOBIN (HGB A1C)      Result Value Range   Hemoglobin A1C 9.5    GLUCOSE, POCT (MANUAL RESULT ENTRY)      Result Value Range   POC Glucose 214 (*) 70 - 99 mg/dl     Review of Systems  Constitutional: Negative for fatigue and unexpected weight change.  Eyes: Negative for visual disturbance.  Respiratory: Negative for cough, chest tightness and shortness of breath.   Cardiovascular: Negative for chest pain, palpitations and leg swelling.  Gastrointestinal: Negative for abdominal pain and blood in stool.  Neurological: Negative for dizziness, light-headedness and headaches.       Objective:   Physical Exam  Vitals reviewed. Constitutional: He is oriented to person, place, and time. He appears well-developed and well-nourished.  HENT:  Head: Normocephalic and atraumatic.  Eyes: Pupils are equal, round, and reactive to light.  Neck: Trachea normal, normal range of motion and full passive range of motion without  pain. Neck supple. Normal carotid pulses and no JVD present. No muscular tenderness present. Carotid bruit is not present. No mass present.    Cardiovascular: Normal rate, regular rhythm, normal heart sounds and intact distal pulses.   Pulmonary/Chest: Effort normal and breath sounds normal.  Abdominal: Soft. Normal appearance and bowel sounds are normal. There is no tenderness. There is no CVA tenderness.  Neurological: He is alert and oriented to person, place, and time.  Skin: Skin is warm, dry and intact. No rash noted.  Psychiatric: He has a normal mood and affect. His behavior is normal.      Assessment & Plan:  [name] is a 67 y.o. 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 Continue same meds for now, continue diet and agree with walking for exercise. Recheck in 2 months with record of home readings.  Sooner if remains high.   Other and unspecified hyperlipidemia - controlled  LDL. Continue pravachol at current dose.   Neck pain - likely MSk, intermittent.  Tylenol as below, rtc precautions.   abd pain resolved. rtc of recurs.   OSA- to discuss options at his upcoming follow up with neuro?  Patient Instructions  Check your blood sugars at various times of the day including fasting, 2 hours after breakfast, lunch or dinner, and bedtime.  You can pick one of these every few days. Bring this record to your next office visit. Recheck in 2 months.  If the blood sugars are remaining above 200, you can be seen sooner.   Your neck pain can be treated with tylenol as needed over the counter, heat and gentle stretches. Recheck if this is worsening.  Return to the clinic or go to the nearest emergency room if any of your symptoms worsen or new symptoms occur.

## 2012-10-13 NOTE — Patient Instructions (Signed)
Check your blood sugars at various times of the day including fasting, 2 hours after breakfast, lunch or dinner, and bedtime.  You can pick one of these every few days. Bring this record to your next office visit. Recheck in 2 months.  If the blood sugars are remaining above 200, you can be seen sooner.   Your neck pain can be treated with tylenol as needed over the counter, heat and gentle stretches. Recheck if this is worsening.  Return to the clinic or go to the nearest emergency room if any of your symptoms worsen or new symptoms occur.

## 2012-10-20 ENCOUNTER — Ambulatory Visit: Payer: MEDICARE | Admitting: Family Medicine

## 2012-12-15 ENCOUNTER — Ambulatory Visit: Payer: MEDICARE | Admitting: Family Medicine

## 2012-12-23 ENCOUNTER — Ambulatory Visit: Payer: Self-pay | Admitting: Nurse Practitioner

## 2013-01-01 ENCOUNTER — Other Ambulatory Visit: Payer: Self-pay

## 2013-01-01 MED ORDER — AMPHETAMINE-DEXTROAMPHET ER 30 MG PO CP24: 30.0000 mg | ORAL_CAPSULE | Freq: Every day | ORAL | Status: DC

## 2013-01-01 NOTE — Telephone Encounter (Signed)
[facility] called requesting refill on patients Adderall.  They will pick it up when ready.  Call back number [phone].

## 2013-01-15 ENCOUNTER — Ambulatory Visit (INDEPENDENT_AMBULATORY_CARE_PROVIDER_SITE_OTHER): Payer: Medicare Other | Admitting: Family Medicine

## 2013-01-15 ENCOUNTER — Encounter (HOSPITAL_COMMUNITY): Payer: Self-pay | Admitting: Emergency Medicine

## 2013-01-15 ENCOUNTER — Encounter (HOSPITAL_COMMUNITY): Payer: Self-pay

## 2013-01-15 ENCOUNTER — Inpatient Hospital Stay (HOSPITAL_COMMUNITY)
Admission: EM | Admit: 2013-01-15 | Discharge: 2013-01-19 | DRG: 375 | Disposition: A | Discharge status: Home / Self Care | Payer: Medicare Other | Attending: Surgery | Admitting: Surgery

## 2013-01-15 ENCOUNTER — Ambulatory Visit: Payer: Medicare Other

## 2013-01-15 ENCOUNTER — Ambulatory Visit (HOSPITAL_COMMUNITY)
Admission: RE | Admit: 2013-01-15 | Discharge: 2013-01-15 | Disposition: A | Discharge status: Home / Self Care | Payer: Medicare Other | Source: Ambulatory Visit | Attending: Family Medicine | Admitting: Family Medicine

## 2013-01-15 VITALS — BP 140/70 | HR 90 | Temp 98.2°F | Resp 18 | Ht 69.0 in | Wt 214.0 lb

## 2013-01-15 DIAGNOSIS — G8929 Other chronic pain: Secondary | ICD-10-CM | POA: Insufficient documentation

## 2013-01-15 DIAGNOSIS — E119 Type 2 diabetes mellitus without complications: Secondary | ICD-10-CM | POA: Insufficient documentation

## 2013-01-15 DIAGNOSIS — D490 Neoplasm of unspecified behavior of digestive system: Principal | ICD-10-CM

## 2013-01-15 DIAGNOSIS — Z79899 Other long term (current) drug therapy: Secondary | ICD-10-CM

## 2013-01-15 DIAGNOSIS — Z7982 Long term (current) use of aspirin: Secondary | ICD-10-CM

## 2013-01-15 DIAGNOSIS — K7689 Other specified diseases of liver: Secondary | ICD-10-CM | POA: Insufficient documentation

## 2013-01-15 DIAGNOSIS — K661 Hemoperitoneum: Secondary | ICD-10-CM | POA: Insufficient documentation

## 2013-01-15 DIAGNOSIS — D62 Acute posthemorrhagic anemia: Secondary | ICD-10-CM | POA: Diagnosis present

## 2013-01-15 DIAGNOSIS — E279 Disorder of adrenal gland, unspecified: Secondary | ICD-10-CM | POA: Diagnosis present

## 2013-01-15 DIAGNOSIS — Z87891 Personal history of nicotine dependence: Secondary | ICD-10-CM

## 2013-01-15 DIAGNOSIS — I10 Essential (primary) hypertension: Secondary | ICD-10-CM | POA: Insufficient documentation

## 2013-01-15 DIAGNOSIS — R1011 Right upper quadrant pain: Secondary | ICD-10-CM | POA: Insufficient documentation

## 2013-01-15 DIAGNOSIS — G47419 Narcolepsy without cataplexy: Secondary | ICD-10-CM | POA: Diagnosis present

## 2013-01-15 DIAGNOSIS — R58 Hemorrhage, not elsewhere classified: Secondary | ICD-10-CM | POA: Diagnosis present

## 2013-01-15 DIAGNOSIS — R109 Unspecified abdominal pain: Secondary | ICD-10-CM

## 2013-01-15 DIAGNOSIS — N400 Enlarged prostate without lower urinary tract symptoms: Secondary | ICD-10-CM | POA: Diagnosis present

## 2013-01-15 DIAGNOSIS — G4733 Obstructive sleep apnea (adult) (pediatric): Secondary | ICD-10-CM | POA: Diagnosis present

## 2013-01-15 DIAGNOSIS — E278 Other specified disorders of adrenal gland: Secondary | ICD-10-CM | POA: Insufficient documentation

## 2013-01-15 HISTORY — DX: Sleep apnea, unspecified: G47.30

## 2013-01-15 LAB — POCT URINALYSIS DIPSTICK
Bilirubin, UA: NEGATIVE
Blood, UA: NEGATIVE
Glucose, UA: 100
Ketones, UA: NEGATIVE
Leukocytes, UA: NEGATIVE
Nitrite, UA: NEGATIVE
Protein, UA: NEGATIVE
Spec Grav, UA: 1.02
Urobilinogen, UA: 2
pH, UA: 6

## 2013-01-15 LAB — CBC WITH DIFFERENTIAL/PLATELET
Basophils Absolute: 0 10*3/uL (ref 0.0–0.1)
Basophils Relative: 0 % (ref 0–1)
Eosinophils Absolute: 0 10*3/uL (ref 0.0–0.7)
Eosinophils Relative: 0 % (ref 0–5)
HCT: 30.2 % — ABNORMAL LOW (ref 39.0–52.0)
Hemoglobin: 10 g/dL — ABNORMAL LOW (ref 13.0–17.0)
Lymphocytes Relative: 14 % (ref 12–46)
Lymphs Abs: 1.1 10*3/uL (ref 0.7–4.0)
MCH: 28.6 pg (ref 26.0–34.0)
MCHC: 33.1 g/dL (ref 30.0–36.0)
MCV: 86.3 fL (ref 78.0–100.0)
Monocytes Absolute: 0.4 10*3/uL (ref 0.1–1.0)
Monocytes Relative: 5 % (ref 3–12)
Neutro Abs: 5.9 10*3/uL (ref 1.7–7.7)
Neutrophils Relative %: 81 % — ABNORMAL HIGH (ref 43–77)
Platelets: 185 10*3/uL (ref 150–400)
RBC: 3.5 MIL/uL — ABNORMAL LOW (ref 4.22–5.81)
RDW: 14.6 % (ref 11.5–15.5)
WBC: 7.3 10*3/uL (ref 4.0–10.5)

## 2013-01-15 LAB — POCT CBC
Granulocyte percent: 71.8 %G (ref 37–80)
HCT, POC: 34.8 % — AB (ref 43.5–53.7)
Hemoglobin: 10.8 g/dL — AB (ref 14.1–18.1)
Lymph, poc: 1.6 (ref 0.6–3.4)
MCH, POC: 28.3 pg (ref 27–31.2)
MCHC: 31 g/dL — AB (ref 31.8–35.4)
MCV: 91.4 fL (ref 80–97)
MID (cbc): 0.4 (ref 0–0.9)
MPV: 7.2 fL (ref 0–99.8)
POC Granulocyte: 5 (ref 2–6.9)
POC LYMPH PERCENT: 22.3 %L (ref 10–50)
POC MID %: 5.9 %M (ref 0–12)
Platelet Count, POC: 208 10*3/uL (ref 142–424)
RBC: 3.81 M/uL — AB (ref 4.69–6.13)
RDW, POC: 16.1 %
WBC: 7 10*3/uL (ref 4.6–10.2)

## 2013-01-15 LAB — COMPREHENSIVE METABOLIC PANEL
ALT: 49 U/L (ref 0–53)
AST: 52 U/L — ABNORMAL HIGH (ref 0–37)
Albumin: 3.5 g/dL (ref 3.5–5.2)
Alkaline Phosphatase: 99 U/L (ref 39–117)
BUN: 18 mg/dL (ref 6–23)
CO2: 24 mEq/L (ref 19–32)
Calcium: 8.6 mg/dL (ref 8.4–10.5)
Chloride: 94 mEq/L — ABNORMAL LOW (ref 96–112)
Creatinine, Ser: 1.14 mg/dL (ref 0.50–1.35)
GFR calc Af Amer: 74 mL/min — ABNORMAL LOW (ref >90)
GFR calc non Af Amer: 64 mL/min — ABNORMAL LOW (ref >90)
Glucose, Bld: 268 mg/dL — ABNORMAL HIGH (ref 70–99)
Potassium: 3.8 mEq/L (ref 3.5–5.1)
Sodium: 131 mEq/L — ABNORMAL LOW (ref 135–145)
Total Bilirubin: 0.7 mg/dL (ref 0.3–1.2)
Total Protein: 7.3 g/dL (ref 6.0–8.3)

## 2013-01-15 LAB — POCT UA - MICROSCOPIC ONLY
Casts, Ur, LPF, POC: NEGATIVE
Crystals, Ur, HPF, POC: NEGATIVE
Mucus, UA: NEGATIVE
Yeast, UA: NEGATIVE

## 2013-01-15 LAB — COMPREHENSIVE METABOLIC PANEL WITH GFR
ALT: 52 U/L (ref 0–53)
AST: 56 U/L — ABNORMAL HIGH (ref 0–37)
Albumin: 4.1 g/dL (ref 3.5–5.2)
Alkaline Phosphatase: 101 U/L (ref 39–117)
BUN: 16 mg/dL (ref 6–23)
CO2: 25 meq/L (ref 19–32)
Calcium: 9.1 mg/dL (ref 8.4–10.5)
Chloride: 100 meq/L (ref 96–112)
Creat: 1.19 mg/dL (ref 0.50–1.35)
Glucose, Bld: 193 mg/dL — ABNORMAL HIGH (ref 70–99)
Potassium: 4.2 meq/L (ref 3.5–5.3)
Sodium: 136 meq/L (ref 135–145)
Total Bilirubin: 0.8 mg/dL (ref 0.3–1.2)
Total Protein: 7.5 g/dL (ref 6.0–8.3)

## 2013-01-15 LAB — PROTIME-INR
INR: 1.11 (ref 0.00–1.49)
Prothrombin Time: 14.2 seconds (ref 11.6–15.2)

## 2013-01-15 LAB — APTT: aPTT: 31 seconds (ref 24–37)

## 2013-01-15 MED ORDER — SODIUM CHLORIDE 0.9 % IV SOLN
INTRAVENOUS | Status: DC
  Administered 2013-01-15: 23:00:00 via INTRAVENOUS

## 2013-01-15 MED ORDER — KETOROLAC TROMETHAMINE 30 MG/ML IJ SOLN
30.0000 mg | Freq: Once | INTRAMUSCULAR | Status: AC
  Administered 2013-01-15: 30 mg via INTRAMUSCULAR

## 2013-01-15 MED ORDER — METRONIDAZOLE 500 MG PO TABS: 500.0000 mg | ORAL_TABLET | Freq: Three times a day (TID) | ORAL | Status: DC

## 2013-01-15 MED ORDER — ONDANSETRON HCL 4 MG/2ML IJ SOLN: 4.0000 mg | Freq: Four times a day (QID) | INTRAMUSCULAR | Status: DC | PRN

## 2013-01-15 MED ORDER — ONDANSETRON HCL 4 MG PO TABS: 4.0000 mg | ORAL_TABLET | Freq: Four times a day (QID) | ORAL | Status: DC | PRN

## 2013-01-15 MED ORDER — MORPHINE SULFATE 2 MG/ML IJ SOLN
2.0000 mg | INTRAMUSCULAR | Status: DC | PRN
  Administered 2013-01-16 – 2013-01-18 (×9): 2 mg via INTRAVENOUS
  Administered 2013-01-18: 4 mg via INTRAVENOUS
  Filled 2013-01-15 (×2): qty 1
  Filled 2013-01-15: qty 2
  Filled 2013-01-15: qty 1
  Filled 2013-01-15: qty 2
  Filled 2013-01-15 (×3): qty 1

## 2013-01-15 MED ORDER — CIPROFLOXACIN HCL 500 MG PO TABS: 500.0000 mg | ORAL_TABLET | Freq: Two times a day (BID) | ORAL | Status: DC

## 2013-01-15 MED ORDER — IOHEXOL 300 MG/ML  SOLN
100.0000 mL | Freq: Once | INTRAMUSCULAR | Status: AC | PRN
  Administered 2013-01-15: 100 mL via INTRAVENOUS

## 2013-01-15 NOTE — Patient Instructions (Addendum)
We are sending you for a CT scan of the abdomen. Hold metformin for 2 days after CT dye.  Watch your sugar closely  If the x-ray is abnormal, we will give you instructions as to what to do.  Assuming that is normal, these are the instructions: Take some MiraLax one dose tonight Begin antibiotics (Cipro and Flagyl) for possible diverticulitis  Go to the emergency room if abruptly worse at anytime.  See Dr. Neva Seat tomorrow after 10 AM or Saturday morning if not improved

## 2013-01-15 NOTE — ED Notes (Signed)
Pt referred to ED by Dr Alwyn Ren, phone [phone], d/t results of abdominal CT: "Enhancing lesion within the inferior aspect of the left hepatic lobe with apparent rupture and intraperitoneal hematoma. Differential includes benign and malignant hepatic lesions." Pt reports abdominal pain began last night.

## 2013-01-15 NOTE — H&P (Signed)
[name] is an 67 y.o. male.   Chief Complaint:  Right-sided abdominal pain HPI: This is a 67 year old male who began having right-sided abdominal pain last night. The pain persisted. He denies any trauma to the area. He presented to his primary physician and was noted to be anemic. A CT scan was performed which demonstrated a left liver lesion that had bled or was slowly bleeding.  He subsequently was sent to the emergency department. He's been hemodynamically normal since being in the emergency department. He denies any dizziness. The pain is increased with movement or with a deep breath or cough. He denies any pre-existing liver disease. He denies heavy alcohol use in the present or past.  Past Medical History  Diagnosis Date  . Diabetes mellitus without complication   . Hypertension   . Sleep apnea     Past Surgical History  Procedure Laterality Date  . Cervical spine surgery through anterior approach      Family History  Problem Relation Age of Onset  . Dementia Mother   . Cancer Father   . Diabetes Brother   . Hypertension Brother   . Cancer Brother    Social History:  reports that he quit smoking about 22 months ago. He does not have any smokeless tobacco history on file. He reports that he does not drink alcohol or use illicit drugs.  Allergies:  Allergies  Allergen Reactions  . Ace Inhibitors Swelling    Angioedema - face.      Prior to Admission medications   Medication Sig Start Date End Date Taking? Authorizing Provider  alfuzosin (UROXATRAL) 10 MG 24 hr tablet Take 10 mg by mouth every evening.    Yes Historical Provider, MD  amLODipine (NORVASC) 10 MG tablet Take 10 mg by mouth every morning. 09/08/12 09/08/13 Yes Shade Flood, MD  amphetamine-dextroamphetamine (ADDERALL XR) 30 MG 24 hr capsule Take 30 mg by mouth every morning. 01/01/13  Yes Melvyn Novas, MD  aspirin 81 MG tablet Take 81 mg by mouth every morning.    Yes Historical Provider, MD   hydrochlorothiazide (HYDRODIURIL) 12.5 MG tablet Take 12.5 mg by mouth every morning. 09/08/12  Yes Shade Flood, MD  losartan (COZAAR) 25 MG tablet Take 25 mg by mouth every morning. 09/08/12 09/08/13 Yes Shade Flood, MD  metFORMIN (GLUCOPHAGE) 1000 MG tablet Take 1 tablet (1,000 mg total) by mouth 2 (two) times daily with a meal. 09/08/12  Yes Shade Flood, MD  pravastatin (PRAVACHOL) 20 MG tablet Take 1 tablet (20 mg total) by mouth at bedtime. 09/08/12  Yes Shade Flood, MD  Sodium Oxybate (XYREM PO) Take 4.5 mg by mouth at bedtime as needed (for sleep).   Yes Historical Provider, MD  spironolactone (ALDACTONE) 25 MG tablet Take 1 tablet (25 mg total) by mouth 2 (two) times daily. 09/08/12  Yes Shade Flood, MD  ciprofloxacin (CIPRO) 500 MG tablet Take 1 tablet (500 mg total) by mouth 2 (two) times daily. 01/15/13   Peyton Najjar, MD  metroNIDAZOLE (FLAGYL) 500 MG tablet Take 1 tablet (500 mg total) by mouth 3 (three) times daily. 01/15/13   Peyton Najjar, MD    Results for orders placed during the hospital encounter of 01/15/13 (from the past 48 hour(s))  CBC WITH DIFFERENTIAL     Status: Abnormal   Collection Time    01/15/13 10:30 PM      Result Value Range   WBC 7.3  4.0 -  10.5 K/uL   RBC 3.50 (*) 4.22 - 5.81 MIL/uL   Hemoglobin 10.0 (*) 13.0 - 17.0 g/dL   HCT 16.1 (*) 09.6 - 04.5 %   MCV 86.3  78.0 - 100.0 fL   MCH 28.6  26.0 - 34.0 pg   MCHC 33.1  30.0 - 36.0 g/dL   RDW 40.9  81.1 - 91.4 %   Platelets 185  150 - 400 K/uL   Neutrophils Relative % 81 (*) 43 - 77 %   Neutro Abs 5.9  1.7 - 7.7 K/uL   Lymphocytes Relative 14  12 - 46 %   Lymphs Abs 1.1  0.7 - 4.0 K/uL   Monocytes Relative 5  3 - 12 %   Monocytes Absolute 0.4  0.1 - 1.0 K/uL   Eosinophils Relative 0  0 - 5 %   Eosinophils Absolute 0.0  0.0 - 0.7 K/uL   Basophils Relative 0  0 - 1 %   Basophils Absolute 0.0  0.0 - 0.1 K/uL  COMPREHENSIVE METABOLIC PANEL     Status: Abnormal   Collection Time     01/15/13 10:30 PM      Result Value Range   Sodium 131 (*) 135 - 145 mEq/L   Potassium 3.8  3.5 - 5.1 mEq/L   Chloride 94 (*) 96 - 112 mEq/L   CO2 24  19 - 32 mEq/L   Glucose, Bld 268 (*) 70 - 99 mg/dL   BUN 18  6 - 23 mg/dL   Creatinine, Ser 7.82  0.50 - 1.35 mg/dL   Calcium 8.6  8.4 - 95.6 mg/dL   Total Protein 7.3  6.0 - 8.3 g/dL   Albumin 3.5  3.5 - 5.2 g/dL   AST 52 (*) 0 - 37 U/L   ALT 49  0 - 53 U/L   Alkaline Phosphatase 99  39 - 117 U/L   Total Bilirubin 0.7  0.3 - 1.2 mg/dL   GFR calc non Af Amer 64 (*) >90 mL/min   GFR calc Af Amer 74 (*) >90 mL/min   Comment:            The eGFR has been calculated     using the CKD EPI equation.     This calculation has not been     validated in all clinical     situations.     eGFR's persistently     <90 mL/min signify     possible Chronic Kidney Disease.  PROTIME-INR     Status: None   Collection Time    01/15/13 10:30 PM      Result Value Range   Prothrombin Time 14.2  11.6 - 15.2 seconds   INR 1.11  0.00 - 1.49  APTT     Status: None   Collection Time    01/15/13 10:30 PM      Result Value Range   aPTT 31  24 - 37 seconds   Ct Abdomen Pelvis W Contrast  01/15/2013   *RADIOLOGY REPORT*  Clinical Data: Abdominal pain, right upper quadrant pain  CT ABDOMEN AND PELVIS WITH CONTRAST  Technique:  Multidetector CT imaging of the abdomen and pelvis was performed following the standard protocol during bolus administration of intravenous contrast.  Contrast: OMNIPAQUE IOHEXOL 300 MG/ML  SOLN  Comparison: None.  Findings: Lung bases are clear.  No pericardial fluid.  Within the anterior inferior aspect of the left hepatic lobe (segment III)  there is a well-circumscribed enhancing lesion  measuring 5 cm x 4.5 cm.  This lesion is partially exophytic. Immediately adjacent to this lesion there is a ill-defined high density fluid collection within the peritoneal space extending along the margin of the liver most suggestive of hematoma.   This collection measures 13 cm x 2 cm by 2 cm.  There is an  apparent rupture of the anterior wall of the hepatic lesion with subsequent intraperitoneal hemorrhage.  There is no additional hepatic lesions.  The gallbladder, pancreas, spleen, and left adrenal gland are normal.  There is a small nodule of the right adrenal gland measuring 10 mm which cannot be fully characterized.  Kidneys are normal.  The stomach, small bowel, and cecum are normal.  The colon and rectosigmoid colon are normal.  There is small amount of fluid along the right pericolic gutter extending into the pelvis.  This is intermediate density and likely relates to the above described liver lesion.  There is no significant free fluid in the pelvis.  The bladder prostate gland are normal.  No pelvic lymphadenopathy. Review of bone windows demonstrates no aggressive osseous lesions.  IMPRESSION:  1.  Enhancing lesion within the inferior aspect of the left hepatic lobe with apparent rupture and intraperitoneal hematoma. Differential includes benign and malignant hepatic lesions. Consider ultrasound-guided biopsy following stabilization of patient. 2.  No evidence of primary neoplasm in the abdomen or pelvis.  Findings discussed with Dr. Alwyn Ren on 01/15/2013 at 2110 hours.   Original Report Authenticated By: Genevive Bi, M.D.   Dg Abd Acute W/chest  01/15/2013   *RADIOLOGY REPORT*  Clinical Data: Right lower quadrant pain  ACUTE ABDOMEN SERIES (ABDOMEN 2 VIEW & CHEST 1 VIEW)  Comparison: None.  Findings: The bowel, soft tissue planes and bony structures are normal.  No ileus or abnormal calcifications.  The chest shows lungs to be clear.  Heart and pulmonary vasculature appear normal.  IMPRESSION: No active disease.  Normal examination of the chest and abdomen.  Clinically significant discrepancy from primary report, if provided: None   Original Report Authenticated By: Sander Radon, M.D.    Review of Systems  Constitutional: Negative  for fever, chills and weight loss.  HENT: Negative for congestion and sore throat.   Respiratory: Negative for cough and shortness of breath.   Cardiovascular: Negative for chest pain.  Gastrointestinal: Positive for abdominal pain. Negative for heartburn, nausea, vomiting, diarrhea, constipation and blood in stool.  Genitourinary: Negative for dysuria and hematuria.  Neurological: Positive for weakness. Negative for dizziness, focal weakness and headaches.  Endo/Heme/Allergies: Does not bruise/bleed easily.    Blood pressure 144/71, pulse 93, temperature 98.8 F (37.1 C), temperature source Oral, height 5\' 9"  (1.753 m), weight 214 lb (97.07 kg), SpO2 98.00%. Physical Exam  Constitutional: He appears well-developed and well-nourished. No distress.  HENT:  Head: Normocephalic and atraumatic.  Eyes: No scleral icterus.  Neck: Neck supple.  Right-sided scar  Cardiovascular: Normal rate and regular rhythm.   Respiratory: Effort normal and breath sounds normal.  GI: Soft. He exhibits no distension and no mass. There is tenderness (In right upper quadrant area). There is no guarding.  Musculoskeletal: He exhibits no edema.  Lymphadenopathy:    He has no cervical adenopathy.  Neurological: He is alert.  Skin: Skin is warm and dry.  Psychiatric: He has a normal mood and affect. His behavior is normal.     Assessment/Plan Hepatic tumor with evidence of bleeding. Currently he is hemodynamically stable. Hemoglobin has dropped a little bit since he's  been in the past 5 hours. Difficult to tell by CT scan the tumor still actively bleeding at this time.  Plan: Transfer to [facility] cone intensive care unit. Monitor serial blood counts. If there is evidence of bleeding, he would need an emergency hepatic arteriogram with attempted embolization. If this was unsuccessful, he needed exploratory laparotomy to control the bleeding. Eventually he will likely need to have the tumor  resected.  Zynasia Burklow J 01/15/2013, 11:17 PM

## 2013-01-15 NOTE — Progress Notes (Addendum)
Subjective: 67 year old man who usually sees Dr. Neva Seat. Last night he went out to eat supper at a fish place. He ate the first bite of food and he said he started having pain in the right lower abdomen. He ate part of his meal but but then quit eating because it does bother him. He continued to hurt him last night in and still hurts this morning. He did try and eat some rice and [place] steak but he didn't eat a lot of it because of the discomfort. He denies burping a lot or passing a lot of gas. He has not any problems with his bowels. His bowels moved last last night. Not been seeing any blood.  Objective: Pleasant gentleman who is in some discomfort.  Non icteric.[name] Kitchen Actually when he sitting still he doesn't seem to be bothered that much for when he started moving it him with pretty significant pain. He points to the right upper quadrant as the area of main pain. Chest is clear to auscultation. Heart regular without murmurs. He is afebrile. No CVA tenderness P. says it when I percuss his back it hurts around to the front from the fibrillations. The abdomen had bowel sounds present but diminished. It hit his soft. On percussion there is tenderness, especially on the right. The palpation causes pain more in the right upper quadrant, but there is some generalized pain. There is mild rebound.  Assessment: Right upper quadrant and diffuse abdominal pain. This is suspicious for gallbladder disease.  Plan: CBC, urinalysis, C. met, abdominal x-ray series.  Results for orders placed in visit on 01/15/13  POCT CBC      Result Value Range   WBC 7.0  4.6 - 10.2 K/uL   Lymph, poc 1.6  0.6 - 3.4   POC LYMPH PERCENT 22.3  10 - 50 %L   MID (cbc) 0.4  0 - 0.9   POC MID % 5.9  0 - 12 %M   POC Granulocyte 5.0  2 - 6.9   Granulocyte percent 71.8  37 - 80 %G   RBC 3.81 (*) 4.69 - 6.13 M/uL   Hemoglobin 10.8 (*) 14.1 - 18.1 g/dL   HCT, POC 16.1 (*) 09.6 - 53.7 %   MCV 91.4  80 - 97 fL   MCH, POC 28.3  27 -  31.2 pg   MCHC 31.0 (*) 31.8 - 35.4 g/dL   RDW, POC 04.5     Platelet Count, POC 208  142 - 424 K/uL   MPV 7.2  0 - 99.8 fL  POCT UA - MICROSCOPIC ONLY      Result Value Range   WBC, Ur, HPF, POC 0-2     RBC, urine, microscopic 0-1     Bacteria, U Microscopic trace     Mucus, UA neg     Epithelial cells, urine per micros 1-2     Crystals, Ur, HPF, POC neg     Casts, Ur, LPF, POC neg     Yeast, UA neg    POCT URINALYSIS DIPSTICK      Result Value Range   Color, UA yellow     Clarity, UA clear     Glucose, UA 100     Bilirubin, UA neg     Ketones, UA neg     Spec Grav, UA 1.020     Blood, UA neg     pH, UA 6.0     Protein, UA neg     Urobilinogen, UA 2.0  Nitrite, UA neg     Leukocytes, UA Negative    '[facility] reading (PRIMARY) by  Dr. Alwyn Ren No acute findings .  Dr. Amil Amen, radiologist, called to report that the patient has an unusual lesion, some kind of a lesion under the surface of the capsule of the liver that is ruptured and bled some into the peritoneal cavity. He's not sure the source of the lesion.  Patient was sitting around to the emergency room for admission to observe this and try and determine what it is.

## 2013-01-15 NOTE — ED Provider Notes (Signed)
History     CSN: [id number]  Arrival date & time 01/15/13  2132   First MD Initiated Contact with Patient 01/15/13 2136      Chief Complaint  Patient presents with  . Abdominal Pain    (Consider location/radiation/quality/duration/timing/severity/associated sxs/prior treatment) Patient is a 67 y.o. male presenting with abdominal pain. The history is provided by the patient.  Abdominal Pain Associated symptoms include abdominal pain.   patient here complaining of abdominal pain that started last night characterized as sharp and worse with movement and localized to his right middle abdomen. Denies any recent history of trauma. No fever or chills. No black or bloody stools. Symptoms have been worsening. His doctor ordered an outpatient abdominal CT which showed hemorrhaging from his liver. He was sent here for admission. He denies any syncope or near-syncope.  Past Medical History  Diagnosis Date  . Diabetes mellitus without complication   . Hypertension   . Sleep apnea     Past Surgical History  Procedure Laterality Date  . Pinched nerve in back      Family History  Problem Relation Age of Onset  . Dementia Mother   . Cancer Father   . Diabetes Brother   . Hypertension Brother   . Cancer Brother     History  Substance Use Topics  . Smoking status: Former Smoker    Quit date: 02/18/2011  . Smokeless tobacco: Not on file  . Alcohol Use: No      Review of Systems  Gastrointestinal: Positive for abdominal pain.  All other systems reviewed and are negative.    Allergies  Ace inhibitors  Home Medications   Current Outpatient Rx  Name  Route  Sig  Dispense  Refill  . alfuzosin (UROXATRAL) 10 MG 24 hr tablet   Oral   Take 10 mg by mouth daily.         [name] Kitchen amLODipine (NORVASC) 10 MG tablet   Oral   Take 1 tablet (10 mg total) by mouth daily.   90 tablet   1   . amphetamine-dextroamphetamine (ADDERALL XR) 30 MG 24 hr capsule   Oral   Take 1 capsule (30  mg total) by mouth daily.   30 capsule   0   . aspirin 81 MG tablet   Oral   Take 81 mg by mouth daily.          . ciprofloxacin (CIPRO) 500 MG tablet   Oral   Take 1 tablet (500 mg total) by mouth 2 (two) times daily.   20 tablet   0   . glipiZIDE (GLUCOTROL) 2.5 mg TABS   Oral   Take 0.5 tablets (2.5 mg total) by mouth daily before supper.   30 tablet   1   . hydrochlorothiazide (HYDRODIURIL) 12.5 MG tablet   Oral   Take 1 tablet (12.5 mg total) by mouth daily.   90 tablet   1   . losartan (COZAAR) 25 MG tablet   Oral   Take 1 tablet (25 mg total) by mouth daily.   90 tablet   1   . metFORMIN (GLUCOPHAGE) 1000 MG tablet   Oral   Take 1 tablet (1,000 mg total) by mouth 2 (two) times daily with a meal.   180 tablet   1   . metroNIDAZOLE (FLAGYL) 500 MG tablet   Oral   Take 1 tablet (500 mg total) by mouth 3 (three) times daily.   21 tablet   0   .  pravastatin (PRAVACHOL) 20 MG tablet   Oral   Take 1 tablet (20 mg total) by mouth at bedtime.   90 tablet   1   . Sodium Oxybate (XYREM PO)   Oral   Take 80 mg by mouth daily.         [name] Kitchen spironolactone (ALDACTONE) 25 MG tablet   Oral   Take 1 tablet (25 mg total) by mouth 2 (two) times daily.   180 tablet   1     BP 144/71  Pulse 93  Temp(Src) 98.8 F (37.1 C) (Oral)  Ht 5\' 9"  (1.753 m)  Wt 214 lb (97.07 kg)  BMI 31.59 kg/m2  SpO2 98%  Physical Exam  Nursing note and vitals reviewed. Constitutional: He is oriented to person, place, and time. He appears well-developed and well-nourished.  Non-toxic appearance. No distress.  HENT:  Head: Normocephalic and atraumatic.  Eyes: Conjunctivae, EOM and lids are normal. Pupils are equal, round, and reactive to light.  Neck: Normal range of motion. Neck supple. No tracheal deviation present. No mass present.  Cardiovascular: Normal rate, regular rhythm and normal heart sounds.  Exam reveals no gallop.   No murmur heard. Pulmonary/Chest: Effort normal  and breath sounds normal. No stridor. No respiratory distress. He has no decreased breath sounds. He has no wheezes. He has no rhonchi. He has no rales.  Abdominal: Soft. Normal appearance and bowel sounds are normal. He exhibits no distension. There is tenderness in the right upper quadrant and right lower quadrant. There is guarding. There is no rigidity, no rebound and no CVA tenderness.    Musculoskeletal: Normal range of motion. He exhibits no edema and no tenderness.  Neurological: He is alert and oriented to person, place, and time. He has normal strength. No cranial nerve deficit or sensory deficit. GCS eye subscore is 4. GCS verbal subscore is 5. GCS motor subscore is 6.  Skin: Skin is warm and dry. No abrasion and no rash noted.  Psychiatric: He has a normal mood and affect. His speech is normal and behavior is normal.    ED Course  Procedures (including critical care time)  Labs Reviewed  CBC WITH DIFFERENTIAL  COMPREHENSIVE METABOLIC PANEL  PROTIME-INR  APTT  TYPE AND SCREEN   Ct Abdomen Pelvis W Contrast  01/15/2013   *RADIOLOGY REPORT*  Clinical Data: Abdominal pain, right upper quadrant pain  CT ABDOMEN AND PELVIS WITH CONTRAST  Technique:  Multidetector CT imaging of the abdomen and pelvis was performed following the standard protocol during bolus administration of intravenous contrast.  Contrast: OMNIPAQUE IOHEXOL 300 MG/ML  SOLN  Comparison: None.  Findings: Lung bases are clear.  No pericardial fluid.  Within the anterior inferior aspect of the left hepatic lobe (segment III)  there is a well-circumscribed enhancing lesion measuring 5 cm x 4.5 cm.  This lesion is partially exophytic. Immediately adjacent to this lesion there is a ill-defined high density fluid collection within the peritoneal space extending along the margin of the liver most suggestive of hematoma.  This collection measures 13 cm x 2 cm by 2 cm.  There is an  apparent rupture of the anterior wall of the  hepatic lesion with subsequent intraperitoneal hemorrhage.  There is no additional hepatic lesions.  The gallbladder, pancreas, spleen, and left adrenal gland are normal.  There is a small nodule of the right adrenal gland measuring 10 mm which cannot be fully characterized.  Kidneys are normal.  The stomach, small bowel, and  cecum are normal.  The colon and rectosigmoid colon are normal.  There is small amount of fluid along the right pericolic gutter extending into the pelvis.  This is intermediate density and likely relates to the above described liver lesion.  There is no significant free fluid in the pelvis.  The bladder prostate gland are normal.  No pelvic lymphadenopathy. Review of bone windows demonstrates no aggressive osseous lesions.  IMPRESSION:  1.  Enhancing lesion within the inferior aspect of the left hepatic lobe with apparent rupture and intraperitoneal hematoma. Differential includes benign and malignant hepatic lesions. Consider ultrasound-guided biopsy following stabilization of patient. 2.  No evidence of primary neoplasm in the abdomen or pelvis.  Findings discussed with Dr. Alwyn Ren on 01/15/2013 at 2110 hours.   Original Report Authenticated By: Genevive Bi, M.D.   Dg Abd Acute W/chest  01/15/2013   *RADIOLOGY REPORT*  Clinical Data: Right lower quadrant pain  ACUTE ABDOMEN SERIES (ABDOMEN 2 VIEW & CHEST 1 VIEW)  Comparison: None.  Findings: The bowel, soft tissue planes and bony structures are normal.  No ileus or abnormal calcifications.  The chest shows lungs to be clear.  Heart and pulmonary vasculature appear normal.  IMPRESSION: No active disease.  Normal examination of the chest and abdomen.  Clinically significant discrepancy from primary report, if provided: None   Original Report Authenticated By: Sander Radon, M.D.     No diagnosis found.    MDM  Spoke with Dr. Abbey Chatters, will come to see the patient and admit        Toy Baker, MD 01/15/13 2208

## 2013-01-16 ENCOUNTER — Encounter (HOSPITAL_COMMUNITY): Payer: Self-pay | Admitting: *Deleted

## 2013-01-16 LAB — CBC
HCT: 27.9 % — ABNORMAL LOW (ref 39.0–52.0)
HCT: 27.4 % — ABNORMAL LOW (ref 39.0–52.0)
HCT: 28.3 % — ABNORMAL LOW (ref 39.0–52.0)
HCT: 29.6 % — ABNORMAL LOW (ref 39.0–52.0)
Hemoglobin: 9.5 g/dL — ABNORMAL LOW (ref 13.0–17.0)
Hemoglobin: 9.4 g/dL — ABNORMAL LOW (ref 13.0–17.0)
Hemoglobin: 9.6 g/dL — ABNORMAL LOW (ref 13.0–17.0)
Hemoglobin: 10 g/dL — ABNORMAL LOW (ref 13.0–17.0)
MCH: 29.2 pg (ref 26.0–34.0)
MCH: 29.5 pg (ref 26.0–34.0)
MCH: 29.2 pg (ref 26.0–34.0)
MCH: 28.8 pg (ref 26.0–34.0)
MCHC: 34.1 g/dL (ref 30.0–36.0)
MCHC: 34.3 g/dL (ref 30.0–36.0)
MCHC: 33.9 g/dL (ref 30.0–36.0)
MCHC: 33.8 g/dL (ref 30.0–36.0)
MCV: 85.8 fL (ref 78.0–100.0)
MCV: 85.9 fL (ref 78.0–100.0)
MCV: 86 fL (ref 78.0–100.0)
MCV: 85.3 fL (ref 78.0–100.0)
Platelets: 154 10*3/uL (ref 150–400)
Platelets: 150 10*3/uL (ref 150–400)
Platelets: 169 10*3/uL (ref 150–400)
Platelets: 170 10*3/uL (ref 150–400)
RBC: 3.25 MIL/uL — ABNORMAL LOW (ref 4.22–5.81)
RBC: 3.19 MIL/uL — ABNORMAL LOW (ref 4.22–5.81)
RBC: 3.29 MIL/uL — ABNORMAL LOW (ref 4.22–5.81)
RBC: 3.47 MIL/uL — ABNORMAL LOW (ref 4.22–5.81)
RDW: 14.6 % (ref 11.5–15.5)
RDW: 14.5 % (ref 11.5–15.5)
RDW: 14.5 % (ref 11.5–15.5)
RDW: 14.6 % (ref 11.5–15.5)
WBC: 6.2 10*3/uL (ref 4.0–10.5)
WBC: 6.9 10*3/uL (ref 4.0–10.5)
WBC: 7.8 10*3/uL (ref 4.0–10.5)
WBC: 8.6 10*3/uL (ref 4.0–10.5)

## 2013-01-16 LAB — GLUCOSE, CAPILLARY
Glucose-Capillary: 220 mg/dL — ABNORMAL HIGH (ref 70–99)
Glucose-Capillary: 269 mg/dL — ABNORMAL HIGH (ref 70–99)
Glucose-Capillary: 200 mg/dL — ABNORMAL HIGH (ref 70–99)
Glucose-Capillary: 248 mg/dL — ABNORMAL HIGH (ref 70–99)

## 2013-01-16 LAB — AFP TUMOR MARKER: AFP-Tumor Marker: 1.9 ng/mL (ref 0.0–8.0)

## 2013-01-16 LAB — TYPE AND SCREEN
ABO/RH(D): B POS
Antibody Screen: POSITIVE
DAT, IgG: NEGATIVE
PT AG Type: NEGATIVE

## 2013-01-16 LAB — MRSA PCR SCREENING: MRSA by PCR: NEGATIVE

## 2013-01-16 LAB — CEA: CEA: 2.7 ng/mL (ref 0.0–5.0)

## 2013-01-16 MED ORDER — INSULIN ASPART 100 UNIT/ML ~~LOC~~ SOLN
0.0000 [IU] | Freq: Three times a day (TID) | SUBCUTANEOUS | Status: DC
  Administered 2013-01-16: 3 [IU] via SUBCUTANEOUS
  Administered 2013-01-16: 8 [IU] via SUBCUTANEOUS
  Administered 2013-01-16: 5 [IU] via SUBCUTANEOUS
  Administered 2013-01-17: 3 [IU] via SUBCUTANEOUS
  Administered 2013-01-17: 5 [IU] via SUBCUTANEOUS
  Administered 2013-01-17 – 2013-01-18 (×2): 3 [IU] via SUBCUTANEOUS
  Administered 2013-01-18: 5 [IU] via SUBCUTANEOUS
  Administered 2013-01-18 – 2013-01-19 (×2): 3 [IU] via SUBCUTANEOUS

## 2013-01-16 MED ORDER — POTASSIUM CHLORIDE IN NACL 20-0.9 MEQ/L-% IV SOLN
INTRAVENOUS | Status: DC
  Administered 2013-01-16: 21:00:00 via INTRAVENOUS
  Filled 2013-01-16 (×3): qty 1000

## 2013-01-16 MED ORDER — LOSARTAN POTASSIUM 25 MG PO TABS
25.0000 mg | ORAL_TABLET | Freq: Every morning | ORAL | Status: DC
  Administered 2013-01-16 – 2013-01-19 (×4): 25 mg via ORAL
  Filled 2013-01-16 (×4): qty 1

## 2013-01-16 MED ORDER — PANTOPRAZOLE SODIUM 40 MG IV SOLR
40.0000 mg | Freq: Every day | INTRAVENOUS | Status: DC
  Administered 2013-01-16 – 2013-01-17 (×2): 40 mg via INTRAVENOUS
  Filled 2013-01-16 (×3): qty 40

## 2013-01-16 MED ORDER — KCL IN DEXTROSE-NACL 20-5-0.45 MEQ/L-%-% IV SOLN
INTRAVENOUS | Status: DC
  Administered 2013-01-16 (×2): via INTRAVENOUS
  Filled 2013-01-16 (×5): qty 1000

## 2013-01-16 MED ORDER — AMLODIPINE BESYLATE 10 MG PO TABS
10.0000 mg | ORAL_TABLET | Freq: Every morning | ORAL | Status: DC
  Administered 2013-01-16 – 2013-01-19 (×4): 10 mg via ORAL
  Filled 2013-01-16 (×4): qty 1

## 2013-01-16 NOTE — Progress Notes (Signed)
Utilization review completed.  P.J. Safire Gordin,RN,BSN Case Manager [phone]  

## 2013-01-16 NOTE — Progress Notes (Signed)
Inpatient Diabetes Program Recommendations  AACE/ADA: New Consensus Statement on Inpatient Glycemic Control (2013)  Target Ranges:  Prepandial:   less than 140 mg/dL      Peak postprandial:   less than 180 mg/dL (1-2 hours)      Critically ill patients:  140 - 180 mg/dL  Results for DERIUS, GHOSH (MRN [id number]) as of 01/16/2013 09:38  Ref. Range 09/08/2012 14:41 01/15/2013 17:30 01/15/2013 22:30  Hemoglobin A1C No range found 9.5    Glucose Latest Range: 70-99 mg/dL  409 (H) 811 (H)   Results for DAXON, KYNE (MRN [id number]) as of 01/16/2013 09:38  Ref. Range 01/16/2013 03:03 01/16/2013 08:00  Glucose-Capillary Latest Range: 70-99 mg/dL 213 (H) 086 (H)    Inpatient Diabetes Program Recommendations Insulin - Basal: May want to consider ordering a low dose basal insulin; recommend Levemir 10 units daily. Correction (SSI): Please consider changing frequency of CBGs and Novolog correction to Q4H (while NPO). HgbA1C: May want to order an A1C to determine glycemic control over the past 2-3 months.  Note: Patient has a history of diabetes and takes Metformin 1000 mg BID at home for diabetes management.  Currently, patient is ordered to receive Novolog 0-15 units TID before meals for inpatient glycemic control.  Blood glucose over the past 10 hours has ranged from 220-269 mg/dl.  Please consider changing the frequency of CBGs and Novolog correction to Q4H.  May want to order an A1C to determine glycemic control over the past 2-3 months.  Also, may want to consider ordering low dose basal insulin to improve inpatient glycemic control.    Thanks, Orlando Penner, RN, MSN, CCRN Diabetes Coordinator Inpatient Diabetes Program [phone]

## 2013-01-16 NOTE — Progress Notes (Signed)
Subjective: Pt admitted overnight to ICU for Liver tumor hemorrhage.  Has had no acute episodes overnight.  Objective: Vital signs in last 24 hours: Temp:  [98.2 F (36.8 C)-98.8 F (37.1 C)] 98.4 F (36.9 C) (05/30 0305) Pulse Rate:  [84-95] 95 (05/30 0500) Resp:  [14-18] 16 (05/30 0500) BP: (124-144)/(64-73) 130/64 mmHg (05/30 0500) SpO2:  [92 %-98 %] 96 % (05/30 0500) Weight:  [214 lb (97.07 kg)-215 lb 6.2 oz (97.7 kg)] 215 lb 6.2 oz (97.7 kg) (05/30 0305) Last BM Date: 01/15/13  Intake/Output from previous day: 05/29 0701 - 05/30 0700 In: 191.7 [I.V.:191.7] Out: -  Intake/Output this shift:    General appearance: alert and cooperative GI: soft, TTP RUQ >>LUQ, ND, no peritoneal signs  Lab Results:   Recent Labs  01/15/13 2230 01/16/13 0350  WBC 7.3 6.2  HGB 10.0* 9.5*  HCT 30.2* 27.9*  PLT 185 154   BMET  Recent Labs  01/15/13 1730 01/15/13 2230  NA 136 131*  K 4.2 3.8  CL 100 94*  CO2 25 24  GLUCOSE 193* 268*  BUN 16 18  CREATININE 1.19 1.14  CALCIUM 9.1 8.6   PT/INR  Recent Labs  01/15/13 2230  LABPROT 14.2  INR 1.11   ABG No results found for this basename: PHART, PCO2, PO2, HCO3,  in the last 72 hours  Studies/Results: Ct Abdomen Pelvis W Contrast  01/15/2013   *RADIOLOGY REPORT*  Clinical Data: Abdominal pain, right upper quadrant pain  CT ABDOMEN AND PELVIS WITH CONTRAST  Technique:  Multidetector CT imaging of the abdomen and pelvis was performed following the standard protocol during bolus administration of intravenous contrast.  Contrast: OMNIPAQUE IOHEXOL 300 MG/ML  SOLN  Comparison: None.  Findings: Lung bases are clear.  No pericardial fluid.  Within the anterior inferior aspect of the left hepatic lobe (segment III)  there is a well-circumscribed enhancing lesion measuring 5 cm x 4.5 cm.  This lesion is partially exophytic. Immediately adjacent to this lesion there is a ill-defined high density fluid collection within the  peritoneal space extending along the margin of the liver most suggestive of hematoma.  This collection measures 13 cm x 2 cm by 2 cm.  There is an  apparent rupture of the anterior wall of the hepatic lesion with subsequent intraperitoneal hemorrhage.  There is no additional hepatic lesions.  The gallbladder, pancreas, spleen, and left adrenal gland are normal.  There is a small nodule of the right adrenal gland measuring 10 mm which cannot be fully characterized.  Kidneys are normal.  The stomach, small bowel, and cecum are normal.  The colon and rectosigmoid colon are normal.  There is small amount of fluid along the right pericolic gutter extending into the pelvis.  This is intermediate density and likely relates to the above described liver lesion.  There is no significant free fluid in the pelvis.  The bladder prostate gland are normal.  No pelvic lymphadenopathy. Review of bone windows demonstrates no aggressive osseous lesions.  IMPRESSION:  1.  Enhancing lesion within the inferior aspect of the left hepatic lobe with apparent rupture and intraperitoneal hematoma. Differential includes benign and malignant hepatic lesions. Consider ultrasound-guided biopsy following stabilization of patient. 2.  No evidence of primary neoplasm in the abdomen or pelvis.  Findings discussed with Dr. Alwyn Ren on 01/15/2013 at 2110 hours.   Original Report Authenticated By: Genevive Bi, M.D.   Dg Abd Acute W/chest  01/15/2013   *RADIOLOGY REPORT*  Clinical Data: Right  lower quadrant pain  ACUTE ABDOMEN SERIES (ABDOMEN 2 VIEW & CHEST 1 VIEW)  Comparison: None.  Findings: The bowel, soft tissue planes and bony structures are normal.  No ileus or abnormal calcifications.  The chest shows lungs to be clear.  Heart and pulmonary vasculature appear normal.  IMPRESSION: No active disease.  Normal examination of the chest and abdomen.  Clinically significant discrepancy from primary report, if provided: None   Original Report  Authenticated By: Sander Radon, M.D.    Anti-infectives: Anti-infectives   None      Assessment/Plan: s/p * No surgery found * cont to monior VS and hct Will discuss with Dr. Donell Beers in regards to liver resection If pt emergently bleeds will need emergent Hepatic angio with embolization   LOS: 1 day    Marigene Ehlers., [facility] 01/16/2013

## 2013-01-17 LAB — BASIC METABOLIC PANEL
BUN: 7 mg/dL (ref 6–23)
CO2: 23 mEq/L (ref 19–32)
Calcium: 8.3 mg/dL — ABNORMAL LOW (ref 8.4–10.5)
Chloride: 101 mEq/L (ref 96–112)
Creatinine, Ser: 0.92 mg/dL (ref 0.50–1.35)
GFR calc Af Amer: >90 mL/min (ref >90)
GFR calc non Af Amer: 85 mL/min — ABNORMAL LOW (ref >90)
Glucose, Bld: 197 mg/dL — ABNORMAL HIGH (ref 70–99)
Potassium: 4 mEq/L (ref 3.5–5.1)
Sodium: 134 mEq/L — ABNORMAL LOW (ref 135–145)

## 2013-01-17 LAB — CBC
HCT: 30.2 % — ABNORMAL LOW (ref 39.0–52.0)
Hemoglobin: 10.1 g/dL — ABNORMAL LOW (ref 13.0–17.0)
MCH: 28.7 pg (ref 26.0–34.0)
MCHC: 33.4 g/dL (ref 30.0–36.0)
MCV: 85.8 fL (ref 78.0–100.0)
Platelets: 158 10*3/uL (ref 150–400)
RBC: 3.52 MIL/uL — ABNORMAL LOW (ref 4.22–5.81)
RDW: 14.6 % (ref 11.5–15.5)
WBC: 8.4 10*3/uL (ref 4.0–10.5)

## 2013-01-17 LAB — GLUCOSE, CAPILLARY
Glucose-Capillary: 187 mg/dL — ABNORMAL HIGH (ref 70–99)
Glucose-Capillary: 180 mg/dL — ABNORMAL HIGH (ref 70–99)
Glucose-Capillary: 204 mg/dL — ABNORMAL HIGH (ref 70–99)
Glucose-Capillary: 184 mg/dL — ABNORMAL HIGH (ref 70–99)
Glucose-Capillary: 84 mg/dL (ref 70–99)
Glucose-Capillary: 192 mg/dL — ABNORMAL HIGH (ref 70–99)

## 2013-01-17 NOTE — Progress Notes (Signed)
[facility] Progress Note    Subjective: Patient almost completely asymptomatic.  Hemoglobin is stable.  Taking clear liquids.  Objective: Vital signs in last 24 hours: Temp:  [98.7 F (37.1 C)-99.8 F (37.7 C)] 98.7 F (37.1 C) (05/31 0746) Pulse Rate:  [85-107] 94 (05/31 0600) Resp:  [15-25] 16 (05/31 0600) BP: (119-138)/(60-75) 126/74 mmHg (05/31 0600) SpO2:  [91 %-97 %] 97 % (05/31 0600) Last BM Date: 01/15/13  Intake/Output from previous day: 05/30 0701 - 05/31 0700 In: 2175 [I.V.:2175] Out: 3455 [Urine:3455] Intake/Output this shift:    General: No acute distress.  Minimal pain.  Lungs: Clear  Abd: Soft, good bowel sounds.  Extremities: No DVT signs or symptoms.  Neuro: Intact  Lab Results:  @LABLAST2 (wbc:2,hgb:2,hct:2,plt:2) BMET  Recent Labs  01/15/13 2230 01/17/13 0345  NA 131* 134*  K 3.8 4.0  CL 94* 101  CO2 24 23  GLUCOSE 268* 197*  BUN 18 7  CREATININE 1.14 0.92  CALCIUM 8.6 8.3*   PT/INR  Recent Labs  01/15/13 2230  LABPROT 14.2  INR 1.11   ABG No results found for this basename: PHART, PCO2, PO2, HCO3,  in the last 72 hours  Studies/Results: Ct Abdomen Pelvis W Contrast  01/15/2013   *RADIOLOGY REPORT*  Clinical Data: Abdominal pain, right upper quadrant pain  CT ABDOMEN AND PELVIS WITH CONTRAST  Technique:  Multidetector CT imaging of the abdomen and pelvis was performed following the standard protocol during bolus administration of intravenous contrast.  Contrast: OMNIPAQUE IOHEXOL 300 MG/ML  SOLN  Comparison: None.  Findings: Lung bases are clear.  No pericardial fluid.  Within the anterior inferior aspect of the left hepatic lobe (segment III)  there is a well-circumscribed enhancing lesion measuring 5 cm x 4.5 cm.  This lesion is partially exophytic. Immediately adjacent to this lesion there is a ill-defined high density fluid collection within the peritoneal space extending along the margin of the liver most suggestive of  hematoma.  This collection measures 13 cm x 2 cm by 2 cm.  There is an  apparent rupture of the anterior wall of the hepatic lesion with subsequent intraperitoneal hemorrhage.  There is no additional hepatic lesions.  The gallbladder, pancreas, spleen, and left adrenal gland are normal.  There is a small nodule of the right adrenal gland measuring 10 mm which cannot be fully characterized.  Kidneys are normal.  The stomach, small bowel, and cecum are normal.  The colon and rectosigmoid colon are normal.  There is small amount of fluid along the right pericolic gutter extending into the pelvis.  This is intermediate density and likely relates to the above described liver lesion.  There is no significant free fluid in the pelvis.  The bladder prostate gland are normal.  No pelvic lymphadenopathy. Review of bone windows demonstrates no aggressive osseous lesions.  IMPRESSION:  1.  Enhancing lesion within the inferior aspect of the left hepatic lobe with apparent rupture and intraperitoneal hematoma. Differential includes benign and malignant hepatic lesions. Consider ultrasound-guided biopsy following stabilization of patient. 2.  No evidence of primary neoplasm in the abdomen or pelvis.  Findings discussed with Dr. Alwyn Ren on 01/15/2013 at 2110 hours.   Original Report Authenticated By: Genevive Bi, M.D.   Dg Abd Acute W/chest  01/15/2013   *RADIOLOGY REPORT*  Clinical Data: Right lower quadrant pain  ACUTE ABDOMEN SERIES (ABDOMEN 2 VIEW & CHEST 1 VIEW)  Comparison: None.  Findings: The bowel, soft tissue planes and bony structures are normal.  No ileus or abnormal calcifications.  The chest shows lungs to be clear.  Heart and pulmonary vasculature appear normal.  IMPRESSION: No active disease.  Normal examination of the chest and abdomen.  Clinically significant discrepancy from primary report, if provided: None   Original Report Authenticated By: Sander Radon, M.D.    Anti-infectives: Anti-infectives    None      Assessment/Plan: s/p  Advance diet Transfer from ICU Repeat Hgb in AM Develop plan for taking care of this tumor.. Will discuss with Dr. Donell Beers. Had Colonoscopy 2 years ago by GI MD in [place] group.  LOS: 2 days   Marta Lamas. Gae Bon, MD, FACS [phone] [facility] 01/17/2013

## 2013-01-18 LAB — CBC WITH DIFFERENTIAL/PLATELET
Basophils Absolute: 0 10*3/uL (ref 0.0–0.1)
Basophils Relative: 0 % (ref 0–1)
Eosinophils Absolute: 0 10*3/uL (ref 0.0–0.7)
Eosinophils Relative: 1 % (ref 0–5)
HCT: 29.7 % — ABNORMAL LOW (ref 39.0–52.0)
Hemoglobin: 9.8 g/dL — ABNORMAL LOW (ref 13.0–17.0)
Lymphocytes Relative: 21 % (ref 12–46)
Lymphs Abs: 1.8 10*3/uL (ref 0.7–4.0)
MCH: 28.7 pg (ref 26.0–34.0)
MCHC: 33 g/dL (ref 30.0–36.0)
MCV: 86.8 fL (ref 78.0–100.0)
Monocytes Absolute: 1 10*3/uL (ref 0.1–1.0)
Monocytes Relative: 11 % (ref 3–12)
Neutro Abs: 5.7 10*3/uL (ref 1.7–7.7)
Neutrophils Relative %: 67 % (ref 43–77)
Platelets: 194 10*3/uL (ref 150–400)
RBC: 3.42 MIL/uL — ABNORMAL LOW (ref 4.22–5.81)
RDW: 14.8 % (ref 11.5–15.5)
WBC: 8.6 10*3/uL (ref 4.0–10.5)

## 2013-01-18 LAB — BASIC METABOLIC PANEL
BUN: 11 mg/dL (ref 6–23)
CO2: 24 mEq/L (ref 19–32)
Calcium: 8.3 mg/dL — ABNORMAL LOW (ref 8.4–10.5)
Chloride: 102 mEq/L (ref 96–112)
Creatinine, Ser: 1.11 mg/dL (ref 0.50–1.35)
GFR calc Af Amer: 77 mL/min — ABNORMAL LOW (ref >90)
GFR calc non Af Amer: 66 mL/min — ABNORMAL LOW (ref >90)
Glucose, Bld: 161 mg/dL — ABNORMAL HIGH (ref 70–99)
Potassium: 3.5 mEq/L (ref 3.5–5.1)
Sodium: 135 mEq/L (ref 135–145)

## 2013-01-18 LAB — GLUCOSE, CAPILLARY
Glucose-Capillary: 163 mg/dL — ABNORMAL HIGH (ref 70–99)
Glucose-Capillary: 174 mg/dL — ABNORMAL HIGH (ref 70–99)
Glucose-Capillary: 211 mg/dL — ABNORMAL HIGH (ref 70–99)
Glucose-Capillary: 174 mg/dL — ABNORMAL HIGH (ref 70–99)

## 2013-01-18 MED ORDER — PANTOPRAZOLE SODIUM 40 MG PO TBEC
40.0000 mg | DELAYED_RELEASE_TABLET | Freq: Every day | ORAL | Status: DC
  Administered 2013-01-18: 40 mg via ORAL
  Filled 2013-01-18: qty 1

## 2013-01-18 NOTE — Progress Notes (Signed)
[facility] Progress Note    Subjective: Patient almost completely asymptomatic.  C/O some RUQ soreness.  Hemoglobin is stable.  Tolerating a diet.  Objective: Vital signs in last 24 hours: Temp:  [98.7 F (37.1 C)-99.1 F (37.3 C)] 99.1 F (37.3 C) (06/01 0542) Pulse Rate:  [81-100] 81 (06/01 0542) Resp:  [15-20] 18 (06/01 0542) BP: (107-121)/(48-66) 111/50 mmHg (06/01 0542) SpO2:  [90 %-98 %] 90 % (06/01 0542) Last BM Date: 01/15/13  Intake/Output from previous day: 05/31 0701 - 06/01 0700 In: 195 [P.O.:120; I.V.:75] Out: 380 [Urine:380] Intake/Output this shift:    General: No acute distress.  Minimal pain.  Lungs: Clear  Abd: Soft, good bowel sounds.  Extremities: No DVT signs or symptoms.  Neuro: Intact  Lab Results:  @LABLAST2 (wbc:2,hgb:2,hct:2,plt:2) BMET  Recent Labs  01/17/13 0345 01/18/13 0340  NA 134* 135  K 4.0 3.5  CL 101 102  CO2 23 24  GLUCOSE 197* 161*  BUN 7 11  CREATININE 0.92 1.11  CALCIUM 8.3* 8.3*   PT/INR  Recent Labs  01/15/13 2230  LABPROT 14.2  INR 1.11   ABG No results found for this basename: PHART, PCO2, PO2, HCO3,  in the last 72 hours  Studies/Results: No results found.  Anti-infectives: Anti-infectives   None      Assessment/Plan: s/p  Cont diet Develop plan for taking care of this tumor.  Will discuss with Dr. Donell Beers. Had Colonoscopy 2 years ago by GI MD in [place] group.  LOS: 3 days   Marta Lamas. Gae Bon, MD, FACS [phone] [facility] 01/18/2013

## 2013-01-19 ENCOUNTER — Ambulatory Visit (INDEPENDENT_AMBULATORY_CARE_PROVIDER_SITE_OTHER): Payer: Medicare Other | Admitting: Family Medicine

## 2013-01-19 ENCOUNTER — Encounter: Payer: Self-pay | Admitting: Family Medicine

## 2013-01-19 VITALS — BP 136/58 | HR 87 | Temp 99.3°F | Resp 16 | Ht 71.0 in | Wt 213.8 lb

## 2013-01-19 DIAGNOSIS — R1011 Right upper quadrant pain: Secondary | ICD-10-CM

## 2013-01-19 DIAGNOSIS — E119 Type 2 diabetes mellitus without complications: Secondary | ICD-10-CM

## 2013-01-19 DIAGNOSIS — D62 Acute posthemorrhagic anemia: Secondary | ICD-10-CM

## 2013-01-19 DIAGNOSIS — D490 Neoplasm of unspecified behavior of digestive system: Secondary | ICD-10-CM

## 2013-01-19 LAB — CBC
HCT: 29.4 % — ABNORMAL LOW (ref 39.0–52.0)
Hemoglobin: 9.8 g/dL — ABNORMAL LOW (ref 13.0–17.0)
MCH: 29 pg (ref 26.0–34.0)
MCHC: 33.3 g/dL (ref 30.0–36.0)
MCV: 87 fL (ref 78.0–100.0)
Platelets: 218 10*3/uL (ref 150–400)
RBC: 3.38 MIL/uL — ABNORMAL LOW (ref 4.22–5.81)
RDW: 14.7 % (ref 11.5–15.5)
WBC: 7.6 10*3/uL (ref 4.0–10.5)

## 2013-01-19 LAB — GLUCOSE, CAPILLARY: Glucose-Capillary: 199 mg/dL — ABNORMAL HIGH (ref 70–99)

## 2013-01-19 MED ORDER — OXYCODONE-ACETAMINOPHEN 5-325 MG PO TABS: 1.0000 | ORAL_TABLET | Freq: Four times a day (QID) | ORAL | Status: DC | PRN

## 2013-01-19 MED ORDER — BISACODYL 10 MG RE SUPP: 10.0000 mg | Freq: Every day | RECTAL | Status: DC | PRN

## 2013-01-19 MED ORDER — OXYCODONE-ACETAMINOPHEN 5-325 MG PO TABS
1.0000 | ORAL_TABLET | ORAL | Status: DC | PRN
  Administered 2013-01-19: 1 via ORAL
  Filled 2013-01-19: qty 1

## 2013-01-19 MED ORDER — FREESTYLE SYSTEM KIT: PACK | Status: DC

## 2013-01-19 NOTE — Discharge Summary (Signed)
Appears stable.  Needs biopsy and can see Dr Donell Beers as outpatient.

## 2013-01-19 NOTE — Progress Notes (Signed)
Discharge patient to home. Home discharge instruction given, no questions verbalized. 

## 2013-01-19 NOTE — Progress Notes (Signed)
  Subjective: Pt feeling well, minimal pain, ambulating well.  Tolerating solid food.  No BM yet.  Urinating on own, +flatus.  No N/V.  Objective: Vital signs in last 24 hours: Temp:  [98.9 F (37.2 C)-100 F (37.8 C)] 99.5 F (37.5 C) (06/02 0448) Pulse Rate:  [91-100] 91 (06/02 0448) Resp:  [16-18] 18 (06/02 0448) BP: (98-116)/(60-64) 98/62 mmHg (06/02 0448) SpO2:  [92 %-95 %] 92 % (06/02 0448) Last BM Date: 01/14/13  Intake/Output from previous day: 06/01 0701 - 06/02 0700 In: 480 [P.O.:480] Out: -  Intake/Output this shift:    PE: Gen:  Alert, NAD, pleasant Abd: Soft, mild tenderness in RLQ, ND, +BS, no HSM, no abdominal scars noted  Lab Results:   Recent Labs  01/17/13 0345 01/18/13 0340  WBC 8.4 8.6  HGB 10.1* 9.8*  HCT 30.2* 29.7*  PLT 158 194   BMET  Recent Labs  01/17/13 0345 01/18/13 0340  NA 134* 135  K 4.0 3.5  CL 101 102  CO2 23 24  GLUCOSE 197* 161*  BUN 7 11  CREATININE 0.92 1.11  CALCIUM 8.3* 8.3*   PT/INR No results found for this basename: LABPROT, INR,  in the last 72 hours CMP     Component Value Date/Time   NA 135 01/18/2013 0340   K 3.5 01/18/2013 0340   CL 102 01/18/2013 0340   CO2 24 01/18/2013 0340   GLUCOSE 161* 01/18/2013 0340   BUN 11 01/18/2013 0340   CREATININE 1.11 01/18/2013 0340   CREATININE 1.19 01/15/2013 1730   CALCIUM 8.3* 01/18/2013 0340   PROT 7.3 01/15/2013 2230   ALBUMIN 3.5 01/15/2013 2230   AST 52* 01/15/2013 2230   ALT 49 01/15/2013 2230   ALKPHOS 99 01/15/2013 2230   BILITOT 0.7 01/15/2013 2230   GFRNONAA 66* 01/18/2013 0340   GFRAA 77* 01/18/2013 0340   Lipase  No results found for this basename: lipase       Studies/Results: No results found.  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  Will f/u with Dr. Donell Beers as OP in 2 weeks 2.  Will likely be able to go home today or tomorrow and f/u as OP 3.  Will likely need repeat CBC  within 1 week to ensure trending up-PCP  4.  Ambulate and IS 5.  SCD's, held lovenox due to bleed  6.  Okay to d/c today after CBC back and found to be stable, has PCP appt today.    LOS: 4 days    Aris Georgia 01/19/2013, 7:28 AM Pager: [phone]

## 2013-01-19 NOTE — Patient Instructions (Signed)
Lab only tomorrow, then recheck with Dr. Neva Seat Thursday morning. Return to the clinic or go to the nearest emergency room if any of your symptoms worsen or new symptoms occur.

## 2013-01-19 NOTE — Discharge Summary (Signed)
Physician Discharge Summary  Patient ID: [name] MRN: [id number] DOB/AGE: Sep 08, 1945 67 y.o.  Admit date: 01/15/2013 Discharge date: 01/19/2013  Admitting Diagnosis: Liver tumor - bleeding Acute blood loss anemia Chronic medical conditions as below  Discharge Diagnosis Patient Active Problem List   Diagnosis Date Noted  . Liver tumor-bleeding 01/15/2013  . Epidermal cyst 06/18/2012  . OSA (obstructive sleep apnea) 03/13/2012  . Abnormal leg movement 12/12/2011  . Blood pressure elevated 09/06/2011  . Diabetes mellitus 09/06/2011  . 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 BPH (benign prostatic hyperplasia) 09/06/2011  . Diverticula of colon 09/06/2011    Consultants None  Imaging: No results found.  Procedures None  Hospital Course:  67 year old male who began having right-sided abdominal pain last night. The pain persisted. He denies any trauma to the area. He presented to his primary physician and was noted to be anemic.  A CT scan was performed which demonstrated a left liver lesion that had bled or was slowly bleeding. He subsequently was sent to the [facility] ED. He's been hemodynamically normal since being in the emergency department. He denies any dizziness. The pain is increased with movement or with a deep breath or cough. He denies any pre-existing liver disease. He denies heavy alcohol use in the present or past.  Patient was seen at [facility] and ultimately transferred to [facility] in case of acute bleed and need for expedient IR resources.   His hospital course was uneventful and he did not require angioembolization.  We monitored his Hgb/Hct closely with serial checks.  He was transferred out of the ICU and to the floor on HD #3.  Diet was advanced as tolerated.  On HD #5, the patient was voiding well, tolerating diet, ambulating well, pain well controlled, vital signs stable, and felt stable for discharge home.  Patient will follow up in our office in 2 weeks with Dr. Donell Beers  for possible liver biopsy vs tumor excision, and knows to call with questions or concerns.  He has an appt today with his PCP.  He is advised to get repeat CBC's within a week to monitor his levels.  He understands the need for this.    Physical Exam: General:  Alert, NAD, pleasant, comfortable Abd:  Soft, ND, mild tenderness    Medication List    TAKE these medications       alfuzosin 10 MG 24 hr tablet  Commonly known as:  UROXATRAL  Take 10 mg by mouth every evening.     amLODipine 10 MG tablet  Commonly known as:  NORVASC  Take 10 mg by mouth every morning.     amphetamine-dextroamphetamine 30 MG 24 hr capsule  Commonly known as:  ADDERALL XR  Take 30 mg by mouth every morning.     aspirin 81 MG tablet  Take 81 mg by mouth every morning.     ciprofloxacin 500 MG tablet  Commonly known as:  CIPRO  Take 1 tablet (500 mg total) by mouth 2 (two) times daily.     hydrochlorothiazide 12.5 MG tablet  Commonly known as:  HYDRODIURIL  Take 12.5 mg by mouth every morning.     losartan 25 MG tablet  Commonly known as:  COZAAR  Take 25 mg by mouth every morning.     metFORMIN 1000 MG tablet  Commonly known as:  GLUCOPHAGE  Take 1 tablet (1,000 mg total) by mouth 2 (two) times daily with a meal.     metroNIDAZOLE 500 MG tablet  Commonly known as:  FLAGYL  Take 1 tablet (500 mg total) by mouth 3 (three) times daily.     oxyCODONE-acetaminophen 5-325 MG per tablet  Commonly known as:  PERCOCET/ROXICET  Take 1-2 tablets by mouth every 6 (six) hours as needed for pain.     pravastatin 20 MG tablet  Commonly known as:  PRAVACHOL  Take 1 tablet (20 mg total) by mouth at bedtime.     spironolactone 25 MG tablet  Commonly known as:  ALDACTONE  Take 1 tablet (25 mg total) by mouth 2 (two) times daily.     XYREM PO  Take 4.5 mg by mouth at bedtime as needed (for sleep).             Follow-up Information   Follow up with [facility], MD In 2 weeks. (office will call  you with an appointment)    Contact information:   [address] [place] [address] [phone]       Signed: Candiss Norse [facility] Surgery [phone]  01/19/2013, 8:40 AM   l

## 2013-01-19 NOTE — Progress Notes (Signed)
Stable appearing.  No intervention at this point.

## 2013-01-19 NOTE — Progress Notes (Signed)
Subjective:    Patient ID: [name], male    DOB: [birth year], 67 y.o.   MRN: [id number]  HPI [name] is a 67 y.o. male Hx of OSA on CPAP, DM2, recently hospitalized (5/29 through today) for bleeding liver tumor and acute blood loss anemia and intraperitoneal hematoma.. , after eval in our office by Dr. Alwyn Ren.  Discharged this am.  per discharge summary today.  Hospital Course:  67 year old male who began having right-sided abdominal pain. The pain persisted. He denies any trauma to the area. He presented to his primary physician and was noted to be anemic. A CT scan was performed which demonstrated a left liver lesion that had bled or was slowly bleeding. Patient was seen at [facility] and ultimately transferred to [facility] in case of acute bleed and need for expedient IR resources. His hospital course was uneventful and he did not require angioembolization. We monitored his Hgb/Hct closely with serial checks. He was transferred out of the ICU and to the floor on HD #3. Diet was advanced as tolerated. On HD #5, the patient was voiding well, tolerating diet, ambulating well, pain well controlled, vital signs stable, and felt stable for discharge home. Patient will follow up in our office in 2 weeks with Dr. Donell Beers for possible liver biopsy vs tumor excision.   Here with Steffanie Rainwater. Labs from hospital: Hgb 9.8 this am (range 9.4 to 10.1).  Plan on repeat CBC before appt with Dr. Donell Beers.  Denies any dizziness.  Does have Rx for percocet if needed for pain, but not taken yet.  Eating and drinking ok today. Slight soreness in abdomen - stable. CEA and AFP testing normal in hospital.   DM2 - glucose 163-199 in hospital - discharged on same home meds, but was treated with insulin in the hospital.    Review of Systems  Constitutional: Negative for fever and appetite change.  Gastrointestinal: Positive for abdominal pain (stable - mild RUQ). Negative for abdominal distention.   Neurological: Negative for dizziness and light-headedness.       Objective:   Physical Exam  Constitutional: He is oriented to person, place, and time. He appears well-developed and well-nourished.  HENT:  Head: Normocephalic and atraumatic.  Eyes: EOM are normal. Pupils are equal, round, and reactive to light.  Neck: No JVD present. Carotid bruit is not present.  Cardiovascular: Normal rate, regular rhythm and normal heart sounds.   No murmur heard. Pulmonary/Chest: Effort normal and breath sounds normal. He has no rales.  Abdominal: Soft. Bowel sounds are normal. There is tenderness in the right upper quadrant. There is no rigidity, no rebound, no guarding, no CVA tenderness and no tenderness at McBurney's point.  Musculoskeletal: He exhibits no edema.  Neurological: He is alert and oriented to person, place, and time.  Skin: Skin is warm and dry.  Psychiatric: He has a normal mood and affect.       Assessment & Plan:  [name] is a 67 y.o. male DM2 (diabetes mellitus, type 2) - Plan: glucose monitor Rx given. Check blood sugars at home and bring record to review at next ov.  Plan on repeat HGB aic next ov and compare to home readings to discuss control and meds.   Abdominal pain, RUQ, s/p hospitalization for bleeding Tumor of liver and anemia.  Symptomatically stable today. Noted AFP and CEA in normal range. Plan on percutaneous biopsy and follow up with Dr. Donell Beers.  Will check CBC as lab only  visit tomorrow, then follow up ov with me in 3 days. Rtc/er precautions discussed, and cautioned on dizziness with percocet, but to take lowest effective dose if needed for abd pain.   Patient Instructions  Lab only tomorrow, then recheck with Dr. Neva Seat Thursday morning. Return to the clinic or go to the nearest emergency room if any of your symptoms worsen or new symptoms occur.

## 2013-01-20 ENCOUNTER — Ambulatory Visit (INDEPENDENT_AMBULATORY_CARE_PROVIDER_SITE_OTHER): Payer: Medicare Other | Admitting: *Deleted

## 2013-01-20 DIAGNOSIS — D62 Acute posthemorrhagic anemia: Secondary | ICD-10-CM

## 2013-01-20 DIAGNOSIS — D490 Neoplasm of unspecified behavior of digestive system: Secondary | ICD-10-CM

## 2013-01-20 LAB — POCT CBC
Granulocyte percent: 71.3 %G (ref 37–80)
HCT, POC: 28.3 % — AB (ref 43.5–53.7)
Hemoglobin: 8.7 g/dL — AB (ref 14.1–18.1)
Lymph, poc: 1.2 (ref 0.6–3.4)
MCH, POC: 28 pg (ref 27–31.2)
MCHC: 30.7 g/dL — AB (ref 31.8–35.4)
MCV: 91.1 fL (ref 80–97)
MID (cbc): 0.4 (ref 0–0.9)
MPV: 7.8 fL (ref 0–99.8)
POC Granulocyte: 3.9 (ref 2–6.9)
POC LYMPH PERCENT: 21.8 %L (ref 10–50)
POC MID %: 6.9 %M (ref 0–12)
Platelet Count, POC: 221 10*3/uL (ref 142–424)
RBC: 3.11 M/uL — AB (ref 4.69–6.13)
RDW, POC: 17.1 %
WBC: 5.4 10*3/uL (ref 4.6–10.2)

## 2013-01-21 ENCOUNTER — Other Ambulatory Visit: Payer: Self-pay

## 2013-01-21 DIAGNOSIS — E119 Type 2 diabetes mellitus without complications: Secondary | ICD-10-CM

## 2013-01-21 MED ORDER — FREESTYLE SYSTEM KIT: PACK | Status: DC

## 2013-01-21 NOTE — Telephone Encounter (Signed)
Pharm needed Rx reprinted w/Dx code for ins. Faxed new Rx in.

## 2013-01-22 ENCOUNTER — Telehealth (INDEPENDENT_AMBULATORY_CARE_PROVIDER_SITE_OTHER): Payer: Self-pay | Admitting: *Deleted

## 2013-01-22 ENCOUNTER — Encounter: Payer: Self-pay | Admitting: Family Medicine

## 2013-01-22 ENCOUNTER — Ambulatory Visit (INDEPENDENT_AMBULATORY_CARE_PROVIDER_SITE_OTHER): Payer: Medicare Other | Admitting: Family Medicine

## 2013-01-22 VITALS — BP 122/60 | HR 94 | Temp 98.1°F | Resp 18 | Ht 69.0 in | Wt 212.8 lb

## 2013-01-22 DIAGNOSIS — I10 Essential (primary) hypertension: Secondary | ICD-10-CM

## 2013-01-22 DIAGNOSIS — D490 Neoplasm of unspecified behavior of digestive system: Secondary | ICD-10-CM

## 2013-01-22 DIAGNOSIS — R1011 Right upper quadrant pain: Secondary | ICD-10-CM

## 2013-01-22 DIAGNOSIS — D62 Acute posthemorrhagic anemia: Secondary | ICD-10-CM

## 2013-01-22 LAB — POCT CBC
Granulocyte percent: 70.7 %G (ref 37–80)
HCT, POC: 32.5 % — AB (ref 43.5–53.7)
Hemoglobin: 10 g/dL — AB (ref 14.1–18.1)
Lymph, poc: 1.4 (ref 0.6–3.4)
MCH, POC: 28 pg (ref 27–31.2)
MCHC: 30.8 g/dL — AB (ref 31.8–35.4)
MCV: 91 fL (ref 80–97)
MID (cbc): 0.4 (ref 0–0.9)
MPV: 7.4 fL (ref 0–99.8)
POC Granulocyte: 4.3 (ref 2–6.9)
POC LYMPH PERCENT: 22.8 %L (ref 10–50)
POC MID %: 6.5 %M (ref 0–12)
Platelet Count, POC: 274 10*3/uL (ref 142–424)
RBC: 3.57 M/uL — AB (ref 4.69–6.13)
RDW, POC: 15 %
WBC: 6.1 10*3/uL (ref 4.6–10.2)

## 2013-01-22 MED ORDER — SPIRONOLACTONE 25 MG PO TABS: 25.0000 mg | ORAL_TABLET | Freq: Two times a day (BID) | ORAL | Status: DC

## 2013-01-22 NOTE — Telephone Encounter (Signed)
Patient called and is aware of his 6/30 f/u appt however patient is asking about coming in prior to that to have his biopsy done so that he can review the results with her during his 6/30 appt.  Please advise

## 2013-01-22 NOTE — Progress Notes (Signed)
Subjective:    Patient ID: [name], male    DOB: 1945/12/29, 67 y.o.   MRN: [id number]  HPI [name] is a 67 y.o. male  See prior notes.  Discharged from [facility] 01/19/13 for bleeding liver tumor and anemia.  Still fatigued, no change from last ov. Lightheaded if bending down only.   Occasional soreness in abdomen. Taking percocet as needed - only needed twice.  No shortness of breath, no chest pain, no palpitations.   No dark/tarry stools.  Eating/drinking ok. Stomach feels swollen, but  Same as when in hospital - no decrease yet.  Needs refill of spirinolactone - 25mg  BID.   Lab visit only 2 days ago: Results for orders placed in visit on 01/20/13  POCT CBC      Result Value Range   WBC 5.4  4.6 - 10.2 K/uL   Lymph, poc 1.2  0.6 - 3.4   POC LYMPH PERCENT 21.8  10 - 50 %L   MID (cbc) 0.4  0 - 0.9   POC MID % 6.9  0 - 12 %M   POC Granulocyte 3.9  2 - 6.9   Granulocyte percent 71.3  37 - 80 %G   RBC 3.11 (*) 4.69 - 6.13 M/uL   Hemoglobin 8.7 (*) 14.1 - 18.1 g/dL   HCT, POC 40.9 (*) 81.1 - 53.7 %   MCV 91.1  80 - 97 fL   MCH, POC 28.0  27 - 31.2 pg   MCHC 30.7 (*) 31.8 - 35.4 g/dL   RDW, POC 91.4     Platelet Count, POC 221  142 - 424 K/uL   MPV 7.8  0 - 99.8 fL   Review of Systems  Constitutional: Positive for fatigue. Negative for diaphoresis.  Respiratory: Negative for shortness of breath.   Cardiovascular: Negative for chest pain.  Gastrointestinal: Positive for abdominal pain and abdominal distention (as above. ).  Skin: Negative for rash.  Neurological: Negative for dizziness and light-headedness (only with bending forward).         Objective:   Physical Exam  Vitals reviewed. Constitutional: He is oriented to person, place, and time. He appears well-developed and well-nourished.  HENT:  Head: Normocephalic and atraumatic.  Eyes: EOM are normal. Pupils are equal, round, and reactive to light.  Neck: No JVD present. Carotid bruit is not present.   Cardiovascular: Regular rhythm and normal heart sounds.   No extrasystoles are present.  No murmur heard. HR 90's.   Pulmonary/Chest: Effort normal and breath sounds normal. He has no rales.  Abdominal: Soft. Bowel sounds are normal. He exhibits distension (protuberant/full abdomen.). There is tenderness (minimal, no guarding. ) in the right upper quadrant. There is no rigidity and no rebound.  Musculoskeletal: He exhibits no edema.  Neurological: He is alert and oriented to person, place, and time.  Skin: Skin is warm and dry.  Psychiatric: He has a normal mood and affect.     Results for orders placed in visit on 01/22/13  POCT CBC      Result Value Range   WBC 6.1  4.6 - 10.2 K/uL   Lymph, poc 1.4  0.6 - 3.4   POC LYMPH PERCENT 22.8  10 - 50 %L   MID (cbc) 0.4  0 - 0.9   POC MID % 6.5  0 - 12 %M   POC Granulocyte 4.3  2 - 6.9   Granulocyte percent 70.7  37 - 80 %G   RBC 3.57 (*)  4.69 - 6.13 M/uL   Hemoglobin 10.0 (*) 14.1 - 18.1 g/dL   HCT, POC 62.1 (*) 30.8 - 53.7 %   MCV 91.0  80 - 97 fL   MCH, POC 28.0  27 - 31.2 pg   MCHC 30.8 (*) 31.8 - 35.4 g/dL   RDW, POC 65.7     Platelet Count, POC 274  142 - 424 K/uL   MPV 7.4  0 - 99.8 fL        Assessment & Plan:  [name] is a 67 y.o. male Abdominal pain, right upper quadrant, liver tumor, with a cute posthemorrhagic anemia - Plan: POCT CBC - improved CBC today.  Will continue to monitor CBC intermittently until follow up with surgery/biopsy. Hemoglobin range 9.4 to 10.1 in hospital. .  Rtc/er precautions discussed.   HTN (hypertension) - Plan: spironolactone (ALDACTONE) 25 MG tablet refilled.    Patient Instructions  Lab only visit Sunday afternoon to recheck your blood count.  Herbert Seta will be here and we will let you know the results. Return to the clinic or go to the nearest emergency room if any of your symptoms worsen or new symptoms occur, including, but not limited to increased fatigue, lightheadedness,  or heart racing.

## 2013-01-22 NOTE — Telephone Encounter (Signed)
His biopsy will be done week of 6/16.  The week of 6.23 i am ldow and will try to work him in clinic that week.

## 2013-01-22 NOTE — Patient Instructions (Signed)
Lab only visit Sunday afternoon to recheck your blood count.  [name] will be here and we will let you know the results. Return to the clinic or go to the nearest emergency room if any of your symptoms worsen or new symptoms occur, including, but not limited to increased fatigue, lightheadedness, or heart racing.

## 2013-01-23 ENCOUNTER — Telehealth (INDEPENDENT_AMBULATORY_CARE_PROVIDER_SITE_OTHER): Payer: Self-pay

## 2013-01-23 NOTE — Telephone Encounter (Signed)
Pt calling to see if bx has been scheduled. I reviewed notes in epic and advised pt that Dr Arita Miss assistant is working on setting up the bx the week of 6-16 per Dr Arita Miss request. Pt states he can be reached at [phone]. I advise pt I will forward this to Dr Arita Miss assisant.

## 2013-01-25 ENCOUNTER — Other Ambulatory Visit (INDEPENDENT_AMBULATORY_CARE_PROVIDER_SITE_OTHER): Payer: Medicare Other | Admitting: Physician Assistant

## 2013-01-25 DIAGNOSIS — D62 Acute posthemorrhagic anemia: Secondary | ICD-10-CM

## 2013-01-25 LAB — POCT CBC
Granulocyte percent: 66.3 %G (ref 37–80)
HCT, POC: 31.4 % — AB (ref 43.5–53.7)
Hemoglobin: 9.4 g/dL — AB (ref 14.1–18.1)
Lymph, poc: 1.8 (ref 0.6–3.4)
MCH, POC: 27.5 pg (ref 27–31.2)
MCHC: 29.9 g/dL — AB (ref 31.8–35.4)
MCV: 91.8 fL (ref 80–97)
MID (cbc): 0.5 (ref 0–0.9)
MPV: 6.8 fL (ref 0–99.8)
POC Granulocyte: 4.6 (ref 2–6.9)
POC LYMPH PERCENT: 26 %L (ref 10–50)
POC MID %: 7.7 %M (ref 0–12)
Platelet Count, POC: 302 10*3/uL (ref 142–424)
RBC: 3.42 M/uL — AB (ref 4.69–6.13)
RDW, POC: 15.9 %
WBC: 6.9 10*3/uL (ref 4.6–10.2)

## 2013-01-25 NOTE — Progress Notes (Signed)
Patient here for lab only. CBC stable. Patient reports fatigue with activities but no worse since last visit.  Still has some right sided abdominal pain but no worse since he was in the hospital.   Recommend return on 01/28/13 for repeat CBC and recheck with Dr. Chilton Si. Advised to return sooner or go to ER with any dizziness, fainting, or worsening fatigue.

## 2013-01-27 ENCOUNTER — Telehealth (INDEPENDENT_AMBULATORY_CARE_PROVIDER_SITE_OTHER): Payer: Self-pay

## 2013-01-27 ENCOUNTER — Other Ambulatory Visit (INDEPENDENT_AMBULATORY_CARE_PROVIDER_SITE_OTHER): Payer: Self-pay | Admitting: General Surgery

## 2013-01-27 DIAGNOSIS — R160 Hepatomegaly, not elsewhere classified: Secondary | ICD-10-CM

## 2013-01-27 NOTE — Telephone Encounter (Signed)
Pt notified that [facility] will call him the week of 6/16 to schedule [place] bx of liver.  He has an appt to f/u w/ Dr. Donell Beers on 6/24 at 4pm to discuss the results.  Pt agreed.

## 2013-01-28 ENCOUNTER — Other Ambulatory Visit (INDEPENDENT_AMBULATORY_CARE_PROVIDER_SITE_OTHER): Payer: Medicare Other

## 2013-01-28 VITALS — BP 110/74 | Temp 98.3°F

## 2013-01-28 DIAGNOSIS — D62 Acute posthemorrhagic anemia: Secondary | ICD-10-CM

## 2013-01-28 LAB — POCT CBC
Granulocyte percent: 67.9 %G (ref 37–80)
HCT, POC: 32.3 % — AB (ref 43.5–53.7)
Hemoglobin: 9.7 g/dL — AB (ref 14.1–18.1)
Lymph, poc: 1.7 (ref 0.6–3.4)
MCH, POC: 27.4 pg (ref 27–31.2)
MCHC: 30 g/dL — AB (ref 31.8–35.4)
MCV: 91.2 fL (ref 80–97)
MID (cbc): 0.4 (ref 0–0.9)
MPV: 6.5 fL (ref 0–99.8)
POC Granulocyte: 4.5 (ref 2–6.9)
POC LYMPH PERCENT: 26.3 %L (ref 10–50)
POC MID %: 5.8 %M (ref 0–12)
Platelet Count, POC: 346 10*3/uL (ref 142–424)
RBC: 3.54 M/uL — AB (ref 4.69–6.13)
RDW, POC: 15.3 %
WBC: 6.6 10*3/uL (ref 4.6–10.2)

## 2013-01-28 NOTE — Progress Notes (Addendum)
  Subjective:    Patient ID: [name], male    DOB: Jul 05, 1946, 67 y.o.   MRN: [id number]  HPI    Review of Systems     Objective:   Physical Exam        Assessment & Plan:    01/28/13 addendum - see labwork. HGb stable past week. - 10.0, 9.4 3 days ago, now 9.7 today.  No lightheadedness, same fatigue, less abd pain. Plan on recheck in 3 days with repeat CBC, rtc precautions discussed.

## 2013-01-28 NOTE — Progress Notes (Signed)
PATIENT HERE FOR LABS ONLY.

## 2013-01-29 ENCOUNTER — Other Ambulatory Visit: Payer: Self-pay | Admitting: Radiology

## 2013-01-31 ENCOUNTER — Ambulatory Visit: Payer: Medicare Other

## 2013-02-01 ENCOUNTER — Ambulatory Visit (INDEPENDENT_AMBULATORY_CARE_PROVIDER_SITE_OTHER): Payer: Medicare Other | Admitting: Family Medicine

## 2013-02-01 VITALS — BP 114/63 | HR 103 | Temp 97.9°F | Resp 18 | Ht 68.5 in | Wt 210.6 lb

## 2013-02-01 DIAGNOSIS — D62 Acute posthemorrhagic anemia: Secondary | ICD-10-CM

## 2013-02-01 DIAGNOSIS — D490 Neoplasm of unspecified behavior of digestive system: Secondary | ICD-10-CM

## 2013-02-01 DIAGNOSIS — D649 Anemia, unspecified: Secondary | ICD-10-CM

## 2013-02-01 LAB — POCT CBC
Granulocyte percent: 59.3 %G (ref 37–80)
HCT, POC: 32.6 % — AB (ref 43.5–53.7)
Hemoglobin: 10 g/dL — AB (ref 14.1–18.1)
Lymph, poc: 2.1 (ref 0.6–3.4)
MCH, POC: 28 pg (ref 27–31.2)
MCHC: 30.7 g/dL — AB (ref 31.8–35.4)
MCV: 91.3 fL (ref 80–97)
MID (cbc): 0.5 (ref 0–0.9)
MPV: 7.7 fL (ref 0–99.8)
POC Granulocyte: 3.9 (ref 2–6.9)
POC LYMPH PERCENT: 32.5 %L (ref 10–50)
POC MID %: 8.2 %M (ref 0–12)
Platelet Count, POC: 365 10*3/uL (ref 142–424)
RBC: 3.57 M/uL — AB (ref 4.69–6.13)
RDW, POC: 15.8 %
WBC: 6.6 10*3/uL (ref 4.6–10.2)

## 2013-02-01 NOTE — Progress Notes (Signed)
Subjective:    Patient ID: [name], male    DOB: [birth year], 67 y.o.   MRN: [id number]  HPI [name] is a 67 y.o. male See prior ov's.   Discharged from [facility] 01/19/13 for bleeding liver tumor and anemia. Has been followed for recheck CBC's. Last ov 01/28/13 for labs - HGb stable -  10.0, 9.4, 9.7.     No change in fatigue, rare abdominal discomfort. Not needing any pain meds recently  Plans on biopsy of liver tumor in 3 days. Plans on biopsy at 10am.  Told not take any meds that morning unless needed, but to not let blood pressure run high. Plan on follow up with surgeon on the 24th about biopsy results.    Review of Systems  Constitutional: Positive for fatigue (stable. ). Negative for fever, chills and appetite change.  Gastrointestinal: Negative for nausea, vomiting, abdominal pain and blood in stool.  Neurological: Negative for dizziness, syncope and light-headedness.       Objective:   Physical Exam  Vitals reviewed. Constitutional: He is oriented to person, place, and time. He appears well-developed and well-nourished. No distress.  Cardiovascular: Normal rate, regular rhythm, normal heart sounds and intact distal pulses.   No murmur heard. Pulmonary/Chest: Effort normal and breath sounds normal.  Abdominal: Soft. Bowel sounds are normal. There is no tenderness.  Neurological: He is alert and oriented to person, place, and time. He displays a negative Romberg sign.  Skin: Skin is warm and dry.  Psychiatric: He has a normal mood and affect. His behavior is normal.     Results for orders placed in visit on 02/01/13  POCT CBC      Result Value Range   WBC 6.6  4.6 - 10.2 K/uL   Lymph, poc 2.1  0.6 - 3.4   POC LYMPH PERCENT 32.5  10 - 50 %L   MID (cbc) 0.5  0 - 0.9   POC MID % 8.2  0 - 12 %M   POC Granulocyte 3.9  2 - 6.9   Granulocyte percent 59.3  37 - 80 %G   RBC 3.57 (*) 4.69 - 6.13 M/uL   Hemoglobin 10.0 (*) 14.1 - 18.1 g/dL   HCT, POC 40.9 (*)  81.1 - 53.7 %   MCV 91.3  80 - 97 fL   MCH, POC 28.0  27 - 31.2 pg   MCHC 30.7 (*) 31.8 - 35.4 g/dL   RDW, POC 91.4     Platelet Count, POC 365  142 - 424 K/uL   MPV 7.7  0 - 99.8 fL        Assessment & Plan:  [name] is a 67 y.o. 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 Hgb stable again today. Planning on biopsy in 3 days, then follow up with surgeon for path results on the 24th. Should call to verify if any specific meds are not to be taken on the day of his biopsy, but likely should take antihypertensives. Follow up with Dr. Merla Riches 2 days after biopsy for CBC for anemia stability, or other as discussed at time of biopsy. Patient Instructions  You can probably take your blood pressure medicine on the morning of your procedure - but call to verify this is ok. Your blood pressure medicines are amlodipine, losartan and spirinolactone. Again - call them to make sure this is ok.  Recheck for blood count this Friday morning with Dr. Merla Riches to make sure your  blood count remains stable. Return to the clinic or go to the nearest emergency room if any of your symptoms worsen or new symptoms occur.

## 2013-02-01 NOTE — Patient Instructions (Addendum)
You can probably take your blood pressure medicine on the morning of your procedure - but call to verify this is ok. Your blood pressure medicines are amlodipine, losartan and spirinolactone. Again - call them to make sure this is ok.  Recheck for blood count this Friday morning with Dr. Merla Riches to make sure your blood count remains stable. Return to the clinic or go to the nearest emergency room if any of your symptoms worsen or new symptoms occur.

## 2013-02-04 ENCOUNTER — Encounter (HOSPITAL_COMMUNITY): Payer: Self-pay | Admitting: Pharmacy Technician

## 2013-02-04 ENCOUNTER — Encounter (HOSPITAL_COMMUNITY): Payer: Self-pay

## 2013-02-04 ENCOUNTER — Telehealth (INDEPENDENT_AMBULATORY_CARE_PROVIDER_SITE_OTHER): Payer: Self-pay | Admitting: General Surgery

## 2013-02-04 ENCOUNTER — Ambulatory Visit (HOSPITAL_COMMUNITY)
Admission: RE | Admit: 2013-02-04 | Discharge: 2013-02-04 | Disposition: A | Discharge status: Home / Self Care | Payer: Medicare Other | Source: Ambulatory Visit | Attending: General Surgery | Admitting: General Surgery

## 2013-02-04 DIAGNOSIS — I10 Essential (primary) hypertension: Secondary | ICD-10-CM | POA: Insufficient documentation

## 2013-02-04 DIAGNOSIS — C228 Malignant neoplasm of liver, primary, unspecified as to type: Secondary | ICD-10-CM | POA: Insufficient documentation

## 2013-02-04 DIAGNOSIS — Z87891 Personal history of nicotine dependence: Secondary | ICD-10-CM | POA: Insufficient documentation

## 2013-02-04 DIAGNOSIS — G4730 Sleep apnea, unspecified: Secondary | ICD-10-CM | POA: Insufficient documentation

## 2013-02-04 DIAGNOSIS — D649 Anemia, unspecified: Secondary | ICD-10-CM | POA: Insufficient documentation

## 2013-02-04 DIAGNOSIS — R160 Hepatomegaly, not elsewhere classified: Secondary | ICD-10-CM

## 2013-02-04 DIAGNOSIS — E119 Type 2 diabetes mellitus without complications: Secondary | ICD-10-CM | POA: Insufficient documentation

## 2013-02-04 LAB — CBC
HCT: 29.9 % — ABNORMAL LOW (ref 39.0–52.0)
Hemoglobin: 9.6 g/dL — ABNORMAL LOW (ref 13.0–17.0)
MCH: 27.7 pg (ref 26.0–34.0)
MCHC: 32.1 g/dL (ref 30.0–36.0)
MCV: 86.4 fL (ref 78.0–100.0)
Platelets: 292 10*3/uL (ref 150–400)
RBC: 3.46 MIL/uL — ABNORMAL LOW (ref 4.22–5.81)
RDW: 14.3 % (ref 11.5–15.5)
WBC: 4.8 10*3/uL (ref 4.0–10.5)

## 2013-02-04 LAB — APTT: aPTT: 33 seconds (ref 24–37)

## 2013-02-04 LAB — PROTIME-INR
INR: 1.09 (ref 0.00–1.49)
Prothrombin Time: 14 seconds (ref 11.6–15.2)

## 2013-02-04 LAB — GLUCOSE, CAPILLARY: Glucose-Capillary: 120 mg/dL — ABNORMAL HIGH (ref 70–99)

## 2013-02-04 MED ORDER — HYDROCODONE-ACETAMINOPHEN 5-325 MG PO TABS: 1.0000 | ORAL_TABLET | ORAL | Status: DC | PRN

## 2013-02-04 MED ORDER — FENTANYL CITRATE 0.05 MG/ML IJ SOLN
INTRAMUSCULAR | Status: AC
  Filled 2013-02-04: qty 4

## 2013-02-04 MED ORDER — SODIUM CHLORIDE 0.9 % IV SOLN: Freq: Once | INTRAVENOUS | Status: DC

## 2013-02-04 MED ORDER — MIDAZOLAM HCL 2 MG/2ML IJ SOLN
INTRAMUSCULAR | Status: AC
  Filled 2013-02-04: qty 4

## 2013-02-04 MED ORDER — FENTANYL CITRATE 0.05 MG/ML IJ SOLN
INTRAMUSCULAR | Status: DC | PRN
  Administered 2013-02-04: 50 ug via INTRAVENOUS
  Administered 2013-02-04 (×2): 25 ug via INTRAVENOUS

## 2013-02-04 MED ORDER — MIDAZOLAM HCL 2 MG/2ML IJ SOLN
INTRAMUSCULAR | Status: DC | PRN
  Administered 2013-02-04 (×2): 1 mg via INTRAVENOUS

## 2013-02-04 NOTE — Telephone Encounter (Signed)
Dr.Adamhenn called in regards to patient complications prior to US Biopsy. Byerly,MD unavailable. I paged Wilson,MD to advise.

## 2013-02-04 NOTE — Procedures (Signed)
US guided biopsy of left hepatic lesion.  No immediate complication.

## 2013-02-04 NOTE — H&P (Signed)
[name] is an 67 y.o. male.   Chief Complaint: Pt was admitted to [facility] 01/15/13- 01/19/13 abd pain  Work up revealed liver tumor rupture and anemia Hg has stabilized and is now scheduled for liver lesion biopsy as OP HPI: DM; HTN; sleep apnea  Past Medical History  Diagnosis Date  . Diabetes mellitus without complication   . Hypertension   . Sleep apnea     Past Surgical History  Procedure Laterality Date  . Pinched nerve in back      Family History  Problem Relation Age of Onset  . Dementia Mother   . Cancer Father   . Diabetes Brother   . Hypertension Brother   . Cancer Brother    Social History:  reports that he quit smoking about 1 years ago. He does not have any smokeless tobacco history on file. He reports that he does not drink alcohol or use illicit drugs.  Allergies:  Allergies  Allergen Reactions  . Ace Inhibitors Swelling    Angioedema - face.      (Not in a hospital admission)  No results found for this or any previous visit (from the past 48 hour(s)). No results found.  Review of Systems  Constitutional: Negative for fever.  Respiratory: Negative for shortness of breath.   Cardiovascular: Negative for chest pain.  Gastrointestinal: Positive for abdominal pain. Negative for nausea and vomiting.  Musculoskeletal: Positive for back pain.       Sometimes  Neurological: Negative for dizziness, weakness and headaches.    There were no vitals taken for this visit. Physical Exam  Constitutional: He is oriented to person, place, and time. He appears well-developed.  Cardiovascular: Normal rate, regular rhythm and normal heart sounds.   No murmur heard. Respiratory: Effort normal and breath sounds normal. He has no wheezes.  GI: Soft. Bowel sounds are normal. There is no tenderness.  Musculoskeletal: Normal range of motion.  Neurological: He is alert and oriented to person, place, and time.  Skin: Skin is warm and dry.  Psychiatric:  He has a normal mood and affect. His behavior is normal. Judgment and thought content normal.     Assessment/Plan Liver tumor rupture 01/15/13 Hospitalized for abd pain; ruptured liver lesion, and anemia til 01/19/13 Now stable H/H Scheduled for liver lesion biopsy as OP Pt aware of procedure benefits and risks and agreeable to proceed Consent signed and in chart  Dalinda Heidt A 02/04/2013, 9:06 AM

## 2013-02-06 ENCOUNTER — Ambulatory Visit (INDEPENDENT_AMBULATORY_CARE_PROVIDER_SITE_OTHER): Payer: Medicare Other | Admitting: Internal Medicine

## 2013-02-06 VITALS — BP 119/72 | HR 90 | Temp 98.1°F | Resp 16 | Ht 68.0 in | Wt 210.0 lb

## 2013-02-06 DIAGNOSIS — R160 Hepatomegaly, not elsewhere classified: Secondary | ICD-10-CM

## 2013-02-06 DIAGNOSIS — K769 Liver disease, unspecified: Secondary | ICD-10-CM

## 2013-02-06 LAB — POCT CBC
Granulocyte percent: 64.8 %G (ref 37–80)
HCT, POC: 32 % — AB (ref 43.5–53.7)
Hemoglobin: 9.9 g/dL — AB (ref 14.1–18.1)
Lymph, poc: 1.3 (ref 0.6–3.4)
MCH, POC: 27.7 pg (ref 27–31.2)
MCHC: 30.9 g/dL — AB (ref 31.8–35.4)
MCV: 89.7 fL (ref 80–97)
MID (cbc): 0.4 (ref 0–0.9)
MPV: 7.3 fL (ref 0–99.8)
POC Granulocyte: 3.2 (ref 2–6.9)
POC LYMPH PERCENT: 27.5 %L (ref 10–50)
POC MID %: 7.7 %M (ref 0–12)
Platelet Count, POC: 266 10*3/uL (ref 142–424)
RBC: 3.57 M/uL — AB (ref 4.69–6.13)
RDW, POC: 15.7 %
WBC: 4.9 10*3/uL (ref 4.6–10.2)

## 2013-02-06 NOTE — Progress Notes (Signed)
  Subjective:    Patient ID: [name], male    DOB: September 25, 1945, 67 y.o.   MRN: [id number]  HPI Biopsy was successful and has remained asymptomatic since. Results are pending. No ecchymoses biopsy site   Review of Systems     Objective:   Physical Exam Vital signs stable     Results for orders placed in visit on 02/06/13  POCT CBC      Result Value Range   WBC 4.9  4.6 - 10.2 K/uL   Lymph, poc 1.3  0.6 - 3.4   POC LYMPH PERCENT 27.5  10 - 50 %L   MID (cbc) 0.4  0 - 0.9   POC MID % 7.7  0 - 12 %M   POC Granulocyte 3.2  2 - 6.9   Granulocyte percent 64.8  37 - 80 %G   RBC 3.57 (*) 4.69 - 6.13 M/uL   Hemoglobin 9.9 (*) 14.1 - 18.1 g/dL   HCT, POC 40.9 (*) 81.1 - 53.7 %   MCV 89.7  80 - 97 fL   MCH, POC 27.7  27 - 31.2 pg   MCHC 30.9 (*) 31.8 - 35.4 g/dL   RDW, POC 91.4     Platelet Count, POC 266  142 - 424 K/uL   MPV 7.3  0 - 99.8 fL    Assessment & Plan:  Liver tumor-biopsy pending  Hemodynamically stable Followup with surgeon next week

## 2013-02-10 ENCOUNTER — Encounter (INDEPENDENT_AMBULATORY_CARE_PROVIDER_SITE_OTHER): Payer: Self-pay | Admitting: General Surgery

## 2013-02-10 ENCOUNTER — Ambulatory Visit (INDEPENDENT_AMBULATORY_CARE_PROVIDER_SITE_OTHER): Payer: Medicare Other | Admitting: General Surgery

## 2013-02-10 VITALS — BP 142/76 | HR 98 | Temp 97.6°F | Resp 16 | Ht 69.0 in | Wt 213.0 lb

## 2013-02-10 DIAGNOSIS — C220 Liver cell carcinoma: Secondary | ICD-10-CM | POA: Insufficient documentation

## 2013-02-10 DIAGNOSIS — C228 Malignant neoplasm of liver, primary, unspecified as to type: Secondary | ICD-10-CM

## 2013-02-10 NOTE — Progress Notes (Signed)
Chief Complaint  Patient presents with  . Routine Post Op    f/u liver mass bx    HISTORY: Patient is a 67 year old male who was admitted to the hospital several weeks ago with hemoperitoneum. He was seen to have a mass in the left lateral segment on CT scan. His hematocrit stabilized and his pain improved over several days. He was able to be discharged to home. He underwent biopsy last week of this mass and it was a hepatocellular cancer. He does not have any prior history of hepatitis or significant alcohol use. Since going home 3 weeks ago, his appetite and energy level have improved significantly. He does still have some soreness in his abdomen and he does still feel slightly weak.  He is not nauseated. He is no longer taking any narcotic pain medication or analgesics. He tolerated the biopsy.  He has not had any jaundice.  Past Medical History  Diagnosis Date  . Diabetes mellitus without complication   . Hypertension   . Sleep apnea   . Hyperlipidemia     Past Surgical History  Procedure Laterality Date  . Pinched nerve in back      Current Outpatient Prescriptions  Medication Sig Dispense Refill  . alfuzosin (UROXATRAL) 10 MG 24 hr tablet       . amLODipine (NORVASC) 10 MG tablet Take 10 mg by mouth every morning.      [name] Kitchen amphetamine-dextroamphetamine (ADDERALL XR) 30 MG 24 hr capsule Take 30 mg by mouth every morning.      [name] Kitchen aspirin 81 MG tablet Take 81 mg by mouth every morning.       . hydrochlorothiazide (HYDRODIURIL) 12.5 MG tablet Take 12.5 mg by mouth every morning.      [name] Kitchen losartan (COZAAR) 25 MG tablet Take 25 mg by mouth every morning.      . metFORMIN (GLUCOPHAGE) 1000 MG tablet Take 1 tablet (1,000 mg total) by mouth 2 (two) times daily with a meal.  180 tablet  1  . pravastatin (PRAVACHOL) 20 MG tablet Take 1 tablet (20 mg total) by mouth at bedtime.  90 tablet  1  . Sodium Oxybate (XYREM PO) Take 4.5 mg by mouth at bedtime as needed (for sleep).      [name] Kitchen  spironolactone (ALDACTONE) 25 MG tablet Take 1 tablet (25 mg total) by mouth 2 (two) times daily.  180 tablet  1  . oxyCODONE-acetaminophen (PERCOCET/ROXICET) 5-325 MG per tablet Take 1-2 tablets by mouth every 6 (six) hours as needed for pain.  30 tablet  0   No current facility-administered medications for this visit.     Allergies  Allergen Reactions  . Ace Inhibitors Swelling    Angioedema - face.      Family History  Problem Relation Age of Onset  . Dementia Mother   . Cancer Father   . Diabetes Brother   . Hypertension Brother   . Cancer Brother      History   Social History  . Marital Status: Single    Spouse Name: N/A    Number of Children: N/A  . Years of Education: N/A   Social History Main Topics  . Smoking status: Former Smoker    Quit date: 02/18/2011  . Smokeless tobacco: None  . Alcohol Use: No  . Drug Use: No  . Sexually Active: Yes    REVIEW OF SYSTEMS - PERTINENT POSITIVES ONLY: 12 point review of systems negative other than HPI and PMH except for hearing  loss.    EXAM: Filed Vitals:   02/10/13 1600  BP: 142/76  Pulse: 98  Temp: 97.6 F (36.4 C)  Resp: 16   Filed Weights   02/10/13 1600  Weight: 213 lb (96.616 kg)     Gen:  No acute distress.  Well nourished and well groomed.   Neurological: Alert and oriented to person, place, and time. Coordination normal.  Head: Normocephalic and atraumatic.  Eyes: Conjunctivae are normal. Pupils are equal, round, and reactive to light. No scleral icterus Cardiovascular: Normal rate Respiratory: Effort normal.  No respiratory distress.  GI: Soft.  Mild epigastric tenderness   Musculoskeletal: Normal range of motion. Extremities are nontender.  Skin: Skin is warm and dry. No rash noted. No diaphoresis. No erythema. No pallor. No clubbing, cyanosis, or edema.   Psychiatric: Normal mood and affect. Behavior is normal. Judgment and thought content normal.    LABORATORY RESULTS: Available labs  are reviewed  HCT 29.9, AST elevated, INR 1.09, CEA 2.7, AFP 1.9, Gluc 268  Pathology Liver, needle/core biopsy, Left hepatic lobe - HEPATOCELLULAR CARCINOMA WITH EXTENSIVE TUMOR NECROSIS, SEE COMMENT.  RADIOLOGY RESULTS: See E-Chart or I-Site for most recent results.  Images and reports are reviewed. CT . Enhancing lesion within the inferior aspect of the left  hepatic lobe with apparent rupture and intraperitoneal hematoma.  Differential includes benign and malignant hepatic lesions.  Consider ultrasound-guided biopsy following stabilization of  patient.  2. No evidence of primary neoplasm in the abdomen or pelvis.     ASSESSMENT AND PLAN: Cancer, hepatocellular Patient appears to have unifocal hepatocellular cancer in the left lateral segment. He did experience rupture which places him at increased risk of recurrence. However with unifocal HCC, the results are still superior with resection.  We will get an MRI to evaluate the remainder of the liver for disease.  As long as he has no additional disease, we will plan to do a laparoscopic left lateral segmentectomy. He has not had any hepatitis serologies, so we will obtain these. I will plan a preop appointment in approximately 2 weeks and surgery in approximately 3 weeks.  I think that we will be able to do this laparoscopically. I reviewed surgery with the patient including the types of incision. I reviewed anatomic diagrams with the patient reviewing what would be removed.  I discussed risks of surgery including bleeding, infection, damage to adjacent structures, bile leak, possible transfusion, possible blood clot, possible cardiac or pulmonary complications. He is at low risk for liver failure.       Maudry Diego MD Surgical Oncology, General and Endocrine Surgery [facility]      Visit Diagnoses: 1. Cancer, hepatocellular     Primary Care Physician: Shade Flood, MD

## 2013-02-10 NOTE — Patient Instructions (Signed)
Get labs drawn and MRI.    Whatever the operative findings, I will discuss the case with your family after we are done in the operating room.  I will talk to you in the next few days when you are more awake.  I will see you in the hospital every weekday that I am not out of town.  My partners help see patients on the weekends and if I am out of town.    WHAT HAPPENS AFTER SURGERY: After surgery, you will go first to the recovery room, then either to the ICU or the floor.  It is important that I am able to know your vital signs and urine output to see how you are doing.  Depending on many factors, you may be in the ICU overnight or not at all.  You will have many tubes and lines in place which is standard for this operation.  You will have several IVs, including possibly a central line in your chest or neck.  You may likely have an arterial line in your wrist.  You may have a tube in your nose in order to suction out the stomach.   You will have a catheter in your bladder.  On your abdomen, you will have a surgical drain and possibly a pain pump with numbing medicine.  You will have compression stockings on your legs to decrease the risk of blood clots.    Sometimes anesthesia makes a decision that you may need to be left on the breathing machine for a period after surgery.  This may be based on your overall health status, or events during surgery.   This is unlikely in your case.    We will address your pain in several ways.  We will use an IV pain pump called a "PCA," or Patient Controlled Analgesia.  This allows you to press a button and immediately receive a dose of pain medication without waiting for a nurse.  We also use IV Tylenol and sometimes IV Toradol which is similar to ibuprofen.  You may also have a pump with numbing medicine delivered directly to your incision.  I use doses and medications that work for the majority of people, but you may need an adjustment to the dose or type of medicine  if your pain is not adequately controlled.  Your throat may be sore, in which case you may need a throat spray or lozenges.    We will ask you to get out of bed the day after surgery in order to maximize your chances of not having complications.  Your risk of pneumonia and blood clots is lower with walking and sitting in the chair.  We will also ask you to perform breathing exercises.  We will also ask to you walk in your room and in the halls for the above reasons, but also in order for you to keep up your strength.    EATING: We will usually start you on clear liquids in around 2 days if your bowel function seems to have returned.  We advance your diet slowly to make sure you are tolerating each step.  All patients do not have a normal appetite when they go home and usually have to take 2-4 cans of nutritional supplement per day while this is improving.  Most patients also find that their taste buds do not seem the same right after surgery, and this can continue into the time of possible post operative chemotherapy and radiation.  Some  patients develop diabetes and will need assistance from a primary care doctor for medication.     GOING HOME! Usually you are able to go home in 4-7 days, depending on whether or not complications happen and what is going on with your overall health status.   If you have more health problems or if you have limited help at home, the therapists and nurses may recommend a temporary rehab or nursing facility to help you get back on your feet before you go home.  These decisions would be made while you are in the hospital with the assistance of a social worker or case manager.    Please bring all insurance/disability forms to our office for the staff to fill out   POSSIBLE COMPLICATIONS This is a very extensive operation and includes complications listed below: Bleeding Infection and possible wound complications such as hernia Damage to adjacent structures Leak of  bile from the surface of the liver Possible need for other procedures, such as abscess drains in radiology or endoscopy.   Possible prolonged hospital stay MOST PATIENTS' ENERGY LEVEL IS NOT BACK TO NORMAL FOR AT LEAST 2-4 MONTHS.  OLDER PATIENTS MAY FEEL WEAK FOR LONGER PERIODS OF TIME.   Difficulty with eating or post operative nausea (around 30%) Possible early recurrence of cancer Possible complications of your medical problems such as heart disease or arrhythmias. Death (less than 2%)  All possible complications are not listed, just the most common.    FURTHER INFORMATION? Please ask questions if you find something that we did not discuss in the office and would like more information.  If you would like another appointment if you have many questions or if your family members would like to come as well, please contact the office.     IF YOU ARE TAKING ASPIRIN, PLAVIX, COUMADIN, OR OTHER BLOOD THINNERS, LET us KNOW IMMEDIATELY SO WE CAN CONTACT YOUR PRESCRIBING HEALTH CARE PROVIDER TO HOLD THE MEDICATION FOR 5-7 DAYS BEFORE SURGERY

## 2013-02-10 NOTE — Assessment & Plan Note (Signed)
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 He did experience rupture which places him at increased risk of recurrence. However with unifocal HCC, the results are still superior with resection.  We will get an MRI to evaluate the remainder of the liver for disease.  As long as he has no additional disease, we will plan to do a laparoscopic left lateral segmentectomy. He has not had any hepatitis serologies, so we will obtain these. I will plan a preop appointment in approximately 2 weeks and surgery in approximately 3 weeks.  I think that we will be able to do this laparoscopically. I reviewed surgery with the patient including the types of incision. I reviewed anatomic diagrams with the patient reviewing what would be removed.  I discussed risks of surgery including bleeding, infection, damage to adjacent structures, bile leak, possible transfusion, possible blood clot, possible cardiac or pulmonary complications. He is at low risk for liver failure.

## 2013-02-11 ENCOUNTER — Telehealth (INDEPENDENT_AMBULATORY_CARE_PROVIDER_SITE_OTHER): Payer: Self-pay | Admitting: General Surgery

## 2013-02-11 LAB — HEPATITIS PANEL, ACUTE
HCV Ab: NEGATIVE
Hep A IgM: NEGATIVE
Hep B C IgM: NEGATIVE
Hepatitis B Surface Ag: NEGATIVE

## 2013-02-11 LAB — HEPATITIS C ANTIBODY: HCV Ab: NEGATIVE

## 2013-02-11 LAB — HEPATITIS B CORE ANTIBODY, TOTAL: Hep B Core Total Ab: POSITIVE — AB

## 2013-02-11 NOTE — Telephone Encounter (Signed)
MR abdomen set up for 02/18/2013 @ 8:45 am at [facility] - [address] location. Patient aware.

## 2013-02-11 NOTE — Addendum Note (Signed)
Addended byLiliana Cline on: 02/11/2013 10:01 AM   Modules accepted: Orders

## 2013-02-16 ENCOUNTER — Encounter (INDEPENDENT_AMBULATORY_CARE_PROVIDER_SITE_OTHER): Payer: Medicare Other | Admitting: General Surgery

## 2013-02-16 ENCOUNTER — Telehealth (INDEPENDENT_AMBULATORY_CARE_PROVIDER_SITE_OTHER): Payer: Self-pay | Admitting: *Deleted

## 2013-02-16 NOTE — Telephone Encounter (Signed)
Valium 5 mg night before surgery and morning of surgery AFTER he signs consent form.   So 2 tabs, no refills.

## 2013-02-16 NOTE — Telephone Encounter (Signed)
Spoke to Dr. Donell Beers regarding message below. It is corrected to Valium 5mg  the night before the MRI and the AM of the MRI #2 no refills.  No consent is required for this procedure.  Called into [facility] [phone].  Patient updated at this time.

## 2013-02-16 NOTE — Telephone Encounter (Signed)
Patient called to state that he has had problems with having an MRI in the past due to claustrophobia.  Patient asking for an order for a medication that will help him during his test this week.

## 2013-02-18 ENCOUNTER — Ambulatory Visit
Admission: RE | Admit: 2013-02-18 | Discharge: 2013-02-18 | Disposition: A | Discharge status: Home / Self Care | Payer: Medicare Other | Source: Ambulatory Visit | Attending: General Surgery | Admitting: General Surgery

## 2013-02-18 DIAGNOSIS — C220 Liver cell carcinoma: Secondary | ICD-10-CM

## 2013-02-18 MED ORDER — GADOXETATE DISODIUM 0.25 MMOL/ML IV SOLN
10.0000 mL | Freq: Once | INTRAVENOUS | Status: AC | PRN
  Administered 2013-02-18: 10 mL via INTRAVENOUS

## 2013-02-19 ENCOUNTER — Ambulatory Visit: Payer: Self-pay | Admitting: Nurse Practitioner

## 2013-02-24 ENCOUNTER — Encounter (INDEPENDENT_AMBULATORY_CARE_PROVIDER_SITE_OTHER): Payer: Self-pay | Admitting: General Surgery

## 2013-02-24 ENCOUNTER — Ambulatory Visit (INDEPENDENT_AMBULATORY_CARE_PROVIDER_SITE_OTHER): Payer: Medicare Other | Admitting: General Surgery

## 2013-02-24 DIAGNOSIS — C220 Liver cell carcinoma: Secondary | ICD-10-CM

## 2013-02-24 DIAGNOSIS — C228 Malignant neoplasm of liver, primary, unspecified as to type: Secondary | ICD-10-CM

## 2013-02-24 DIAGNOSIS — B1910 Unspecified viral hepatitis B without hepatic coma: Secondary | ICD-10-CM | POA: Insufficient documentation

## 2013-02-24 NOTE — Progress Notes (Signed)
HISTORY: Pt here to discuss surgery for Monday.  We have planned a left lateral segmentectomy. 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  He has been doing well at home.  His appetite is OK.     PERTINENT REVIEW OF SYSTEMS: Otherwise negative.    Filed Vitals:   02/24/13 1543  BP: 120/70  Pulse: 68  Temp: 98 F (36.7 C)  Resp: 16   Filed Weights   02/24/13 1543  Weight: 214 lb (97.07 kg)   EXAM: Head: Normocephalic and atraumatic.  Eyes:  Conjunctivae are normal. Pupils are equal, round, and reactive to light. No scleral icterus.  Resp: No respiratory distress, normal effort. Abd:  Abdomen is soft, non distended and non tender. No masses are palpable.  There is no rebound and no guarding.  Neurological: Alert and oriented to person, place, and time. Coordination normal.  Skin: Skin is warm and dry. No rash noted. No diaphoretic. No erythema. No pallor.  Psychiatric: Normal mood and affect. Normal behavior. Judgment and thought content normal.      ASSESSMENT AND PLAN:   Cancer, hepatocellular Plan left lateral segmentectomy. Will attempt this laparoscopically.  The patient was diagnosed because of tumor rupture. I've advised the patient that if I see horrible cirrhosis or peritoneal carcinomatosis, we will abort the procedure.  I reviewed the major risks of the procedure including bleeding, infection, damage to adjacent structures, blood clot, pneumonia, bile leak, swelling/edema, liver failure.   I advised the patient to bring his CPAP machine to the hospital. I will order a hepatitis B RNA level.  I will request a GI referral for hepatitis.    30 min spent in counseling.        Chijioke Lasser L Tamarius Rosenfield, MD Surgical Oncology, General & Endocrine Surgery [facility]  GREENE,JEFFREY R, MD Greene, Jeffrey R, MD   

## 2013-02-24 NOTE — Patient Instructions (Addendum)
YAMATO KOPF  02/24/2013   Your procedure is scheduled on:  03/02/13    Report to [facility] at    0645  AM.  Call this number if you have problems the morning of surgery: [phone]   Remember:   Fleets enema nite before surgery    Do not eat food or drink liquids after midnight.   Take these medicines the morning of surgery with A SIP OF WATER:    Do not wear jewelry,   Do not wear lotions, powders, or perfumes.   . Men may shave face and neck.  Do not bring valuables to the hospital.  Contacts, dentures or bridgework may not be worn into surgery.  Leave suitcase in the car. After surgery it may be brought to your room.  For patients admitted to the hospital, checkout time is 11:00 AM the day of  discharge.     SEE CHG INSTRUCTION SHEET    Please read over the following fact sheets that you were given, coughing and deep breathing exercises, leg exercises, Blood transfusion fact sheet                Failure to comply with these instructions may result in cancellation of your surgery.                Patient Signature ____________________________              Nurse Signature _____________________________

## 2013-02-24 NOTE — Assessment & Plan Note (Signed)
Plan left lateral segmentectomy. Will attempt this laparoscopically.  The patient was diagnosed because of tumor rupture. I've advised the patient that if I see horrible cirrhosis or peritoneal carcinomatosis, we will abort the procedure.  I reviewed the major risks of the procedure including bleeding, infection, damage to adjacent structures, blood clot, pneumonia, bile leak, swelling/edema, liver failure.   I advised the patient to bring his CPAP machine to the hospital. I will order a hepatitis B RNA level.  I will request a GI referral for hepatitis.    30 min spent in counseling.

## 2013-02-24 NOTE — Patient Instructions (Signed)
Whatever the operative findings, I will discuss the case with your family after we are done in the operating room.  I will talk to you in the next few days when you are more awake.  I will see you in the hospital every weekday that I am not out of town.  My partners help see patients on the weekends and if I am out of town.    WHAT HAPPENS AFTER SURGERY: After surgery, you will go first to the recovery room, then probably to the ICU for careful monitoring.  It is important that I am able to know your vital signs and urine output to see how you are doing.  Depending on many factors, you may be in the ICU overnight, or for several days.  You will have many tubes and lines in place which is standard for this operation.  You will have several IVs, including possibly a central line in your chest or neck.  You will likely have an arterial line in your wrist.  You will have a tube in your nose for 4-5 days in order to suction out the stomach and liver secretions to help the surgical connections heal.  YOU WILL NOT BE ABLE TO EAT FOR AT LEAST 2-3 DAYS AFTER SURGERY.  You will have a catheter in your bladder.  On your abdomen, you will have several surgical drains and possibly a pain pump with numbing medicine.  You will have compression stockings on your legs to decrease the risk of blood clots.    Sometimes anesthesia makes a decision that you may need to be left on the breathing machine for a period after surgery.  This may be based on your overall health status, or events during surgery.    We will address your pain in several ways.  We will use an IV pain pump called a "PCA," or Patient Controlled Analgesia.  This allows you to press a button and immediately receive a dose of pain medication without waiting for a nurse.  We also use IV Tylenol and sometimes IV Toradol which is similar to ibuprofen.  You may also have a pump with numbing medicine delivered directly to your incision.  I use doses and medications  that work for the majority of people, but you may need an adjustment to the dose or type of medicine if your pain is not adequately controlled.  Your throat may be sore, in which case you may need a throat spray or lozenges.    We will ask you to get out of bed the day after surgery in order to maximize your chances of not having complications.  Your risk of pneumonia and blood clots is lower with walking and sitting in the chair.  We will also ask you to perform breathing exercises.  We will also ask to you walk in your room and in the halls for the above reasons, but also in order for you to keep up your strength.    EATING: We will usually start you on clear liquids in around 2 days if your bowel function seems to have returned.  We advance your diet slowly to make sure you are tolerating each step.  All patients do not have a normal appetite when they go home and usually have to take 2-4 cans of nutritional supplement per day while this is improving.  Most patients also find that their taste buds do not seem the same right after surgery, and this can continue into the  time of possible post operative chemotherapy and radiation.  Some patients develop diabetes and will need assistance from a primary care doctor for medication.     GOING HOME! Usually you are able to go home in 5-7 days, depending on whether or not complications happen and what is going on with your overall health status.   If you have more health problems or if you have limited help at home, the therapists and nurses may recommend a temporary rehab or nursing facility to help you get back on your feet before you go home.  These decisions would be made while you are in the hospital with the assistance of a social worker or case manager.    Please bring all insurance/disability forms to our office for the staff to fill out   POSSIBLE COMPLICATIONS This is a very extensive operation and includes complications listed  below: Bleeding Infection and possible wound complications such as hernia Damage to adjacent structures Leak of bile from the surface of the liver Possible need for other procedures, such as abscess drains in radiology or endoscopy.   Possible prolonged hospital stay MOST PATIENTS' ENERGY LEVEL IS NOT BACK TO NORMAL FOR AT LEAST 4-6 MONTHS.  OLDER PATIENTS MAY FEEL WEAK FOR LONGER PERIODS OF TIME.   Difficulty with eating or post operative nausea  Possible early recurrence of cancer Possible complications of your medical problems such as heart disease or arrhythmias. Death (less than 2%)  All possible complications are not listed, just the most common.    FURTHER INFORMATION? Please ask questions if you find something that we did not discuss in the office and would like more information.  If you would like another appointment if you have many questions or if your family members would like to come as well, please contact the office.     IF YOU ARE TAKING ASPIRIN, PLAVIX, COUMADIN, OR OTHER BLOOD THINNERS, LET us KNOW IMMEDIATELY SO WE CAN CONTACT YOUR PRESCRIBING HEALTH CARE PROVIDER TO HOLD THE MEDICATION FOR 5-7 DAYS BEFORE SURGERY

## 2013-02-25 ENCOUNTER — Other Ambulatory Visit (INDEPENDENT_AMBULATORY_CARE_PROVIDER_SITE_OTHER): Payer: Self-pay | Admitting: General Surgery

## 2013-02-25 ENCOUNTER — Encounter (HOSPITAL_COMMUNITY): Payer: Self-pay | Admitting: Pharmacy Technician

## 2013-02-25 ENCOUNTER — Encounter (HOSPITAL_COMMUNITY): Payer: Self-pay

## 2013-02-25 ENCOUNTER — Encounter (HOSPITAL_COMMUNITY)
Admission: RE | Admit: 2013-02-25 | Discharge: 2013-02-25 | Disposition: A | Discharge status: Home / Self Care | Payer: Medicare Other | Source: Ambulatory Visit | Attending: General Surgery | Admitting: General Surgery

## 2013-02-25 ENCOUNTER — Ambulatory Visit: Payer: Self-pay | Admitting: Nurse Practitioner

## 2013-02-25 DIAGNOSIS — Z01812 Encounter for preprocedural laboratory examination: Secondary | ICD-10-CM | POA: Insufficient documentation

## 2013-02-25 DIAGNOSIS — R768 Other specified abnormal immunological findings in serum: Secondary | ICD-10-CM

## 2013-02-25 DIAGNOSIS — Z01810 Encounter for preprocedural cardiovascular examination: Secondary | ICD-10-CM | POA: Insufficient documentation

## 2013-02-25 DIAGNOSIS — Z0183 Encounter for blood typing: Secondary | ICD-10-CM | POA: Insufficient documentation

## 2013-02-25 DIAGNOSIS — C228 Malignant neoplasm of liver, primary, unspecified as to type: Secondary | ICD-10-CM | POA: Insufficient documentation

## 2013-02-25 HISTORY — DX: Narcolepsy without cataplexy: G47.419

## 2013-02-25 HISTORY — DX: Malignant (primary) neoplasm, unspecified: C80.1

## 2013-02-25 LAB — TYPE AND SCREEN
ABO/RH(D): B POS
Antibody Screen: POSITIVE
DAT, IgG: NEGATIVE

## 2013-02-25 LAB — COMPREHENSIVE METABOLIC PANEL
ALT: 40 U/L (ref 0–53)
AST: 45 U/L — ABNORMAL HIGH (ref 0–37)
Albumin: 3.6 g/dL (ref 3.5–5.2)
Alkaline Phosphatase: 72 U/L (ref 39–117)
BUN: 12 mg/dL (ref 6–23)
CO2: 25 mEq/L (ref 19–32)
Calcium: 9.2 mg/dL (ref 8.4–10.5)
Chloride: 101 mEq/L (ref 96–112)
Creatinine, Ser: 1.03 mg/dL (ref 0.50–1.35)
GFR calc Af Amer: 84 mL/min — ABNORMAL LOW (ref >90)
GFR calc non Af Amer: 73 mL/min — ABNORMAL LOW (ref >90)
Glucose, Bld: 203 mg/dL — ABNORMAL HIGH (ref 70–99)
Potassium: 4 mEq/L (ref 3.5–5.1)
Sodium: 136 mEq/L (ref 135–145)
Total Bilirubin: 0.4 mg/dL (ref 0.3–1.2)
Total Protein: 8.2 g/dL (ref 6.0–8.3)

## 2013-02-25 LAB — PROTIME-INR
INR: 1.09 (ref 0.00–1.49)
Prothrombin Time: 13.9 seconds (ref 11.6–15.2)

## 2013-02-25 LAB — CBC WITH DIFFERENTIAL/PLATELET
Basophils Absolute: 0 10*3/uL (ref 0.0–0.1)
Basophils Relative: 1 % (ref 0–1)
Eosinophils Absolute: 0 10*3/uL (ref 0.0–0.7)
Eosinophils Relative: 1 % (ref 0–5)
HCT: 31.4 % — ABNORMAL LOW (ref 39.0–52.0)
Hemoglobin: 9.9 g/dL — ABNORMAL LOW (ref 13.0–17.0)
Lymphocytes Relative: 36 % (ref 12–46)
Lymphs Abs: 1.3 10*3/uL (ref 0.7–4.0)
MCH: 26.9 pg (ref 26.0–34.0)
MCHC: 31.5 g/dL (ref 30.0–36.0)
MCV: 85.3 fL (ref 78.0–100.0)
Monocytes Absolute: 0.4 10*3/uL (ref 0.1–1.0)
Monocytes Relative: 11 % (ref 3–12)
Neutro Abs: 1.8 10*3/uL (ref 1.7–7.7)
Neutrophils Relative %: 52 % (ref 43–77)
Platelets: 215 10*3/uL (ref 150–400)
RBC: 3.68 MIL/uL — ABNORMAL LOW (ref 4.22–5.81)
RDW: 14.5 % (ref 11.5–15.5)
WBC: 3.5 10*3/uL — ABNORMAL LOW (ref 4.0–10.5)

## 2013-02-25 LAB — PREPARE RBC (CROSSMATCH)

## 2013-02-25 LAB — APTT: aPTT: 29 seconds (ref 24–37)

## 2013-02-25 NOTE — Progress Notes (Signed)
CBC results faxed via EPIC to Dr Donell Beers.

## 2013-02-26 ENCOUNTER — Telehealth (INDEPENDENT_AMBULATORY_CARE_PROVIDER_SITE_OTHER): Payer: Self-pay | Admitting: General Surgery

## 2013-02-26 NOTE — Telephone Encounter (Signed)
Patient is aware of appt with [facility] on 03/19/13 at 10:30 am

## 2013-02-27 ENCOUNTER — Encounter (HOSPITAL_COMMUNITY): Payer: Self-pay

## 2013-02-27 NOTE — Progress Notes (Signed)
Heb B results per lab takes a few days for lab results to be complete.

## 2013-02-27 NOTE — Progress Notes (Signed)
Spoke with [facility] to make sure everything correct for type and screen and type and cross for surgery on 01/31/13.  Spoke with Danielle in [facility] on 02/27/13 .  I told Duwayne Heck that received phone call on 02/26/13 from [facility] that patient had antibodies in blood and type and screen and type and crossmatch would need to be done am of surgery.  [facility] is aware patient has order for 4 units of blood to type and cross for and patient will arrive at 0630am on 03/02/13 and surgery scheduled for 0900am.  Duwayne Heck stated everything looked correct in EPIC.

## 2013-02-27 NOTE — Progress Notes (Signed)
Requested last office visit note from Dr Porfirio Mylar Dohmeier on 02/26/13 and 02/27/13.

## 2013-02-27 NOTE — Progress Notes (Signed)
Dr Acey Lav made aware of EKG on chart done 02/25/13.  No EKG sent from PCP office.  No further orders given.

## 2013-02-27 NOTE — Progress Notes (Signed)
Requested most recent EKG from office of Dr Meredith Staggers.  If they have they will fax.

## 2013-03-02 ENCOUNTER — Encounter (HOSPITAL_COMMUNITY): Payer: Self-pay | Admitting: *Deleted

## 2013-03-02 ENCOUNTER — Encounter (HOSPITAL_COMMUNITY)
Admission: RE | Disposition: A | Discharge status: Home / Self Care | Payer: Self-pay | Source: Ambulatory Visit | Attending: General Surgery

## 2013-03-02 ENCOUNTER — Inpatient Hospital Stay (HOSPITAL_COMMUNITY)
Admission: RE | Admit: 2013-03-02 | Discharge: 2013-03-09 | DRG: 405 | Disposition: A | Discharge status: Home / Self Care | Payer: Medicare Other | Source: Ambulatory Visit | Attending: General Surgery | Admitting: General Surgery

## 2013-03-02 ENCOUNTER — Ambulatory Visit (HOSPITAL_COMMUNITY): Payer: Medicare Other | Admitting: Anesthesiology

## 2013-03-02 ENCOUNTER — Encounter (HOSPITAL_COMMUNITY): Payer: Self-pay | Admitting: Anesthesiology

## 2013-03-02 DIAGNOSIS — G4733 Obstructive sleep apnea (adult) (pediatric): Secondary | ICD-10-CM

## 2013-03-02 DIAGNOSIS — Y836 Removal of other organ (partial) (total) as the cause of abnormal reaction of the patient, or of later complication, without mention of misadventure at the time of the procedure: Secondary | ICD-10-CM | POA: Diagnosis not present

## 2013-03-02 DIAGNOSIS — B1910 Unspecified viral hepatitis B without hepatic coma: Secondary | ICD-10-CM | POA: Diagnosis present

## 2013-03-02 DIAGNOSIS — I498 Other specified cardiac arrhythmias: Secondary | ICD-10-CM | POA: Diagnosis not present

## 2013-03-02 DIAGNOSIS — E785 Hyperlipidemia, unspecified: Secondary | ICD-10-CM | POA: Diagnosis present

## 2013-03-02 DIAGNOSIS — J95821 Acute postprocedural respiratory failure: Secondary | ICD-10-CM

## 2013-03-02 DIAGNOSIS — I252 Old myocardial infarction: Secondary | ICD-10-CM

## 2013-03-02 DIAGNOSIS — C228 Malignant neoplasm of liver, primary, unspecified as to type: Principal | ICD-10-CM | POA: Diagnosis present

## 2013-03-02 DIAGNOSIS — G47419 Narcolepsy without cataplexy: Secondary | ICD-10-CM | POA: Diagnosis present

## 2013-03-02 DIAGNOSIS — G9340 Encephalopathy, unspecified: Secondary | ICD-10-CM | POA: Diagnosis not present

## 2013-03-02 DIAGNOSIS — K929 Disease of digestive system, unspecified: Secondary | ICD-10-CM | POA: Diagnosis not present

## 2013-03-02 DIAGNOSIS — I2510 Atherosclerotic heart disease of native coronary artery without angina pectoris: Secondary | ICD-10-CM | POA: Diagnosis present

## 2013-03-02 DIAGNOSIS — I10 Essential (primary) hypertension: Secondary | ICD-10-CM | POA: Diagnosis present

## 2013-03-02 DIAGNOSIS — E119 Type 2 diabetes mellitus without complications: Secondary | ICD-10-CM | POA: Diagnosis present

## 2013-03-02 DIAGNOSIS — I509 Heart failure, unspecified: Secondary | ICD-10-CM | POA: Diagnosis present

## 2013-03-02 DIAGNOSIS — IMO0001 Reserved for inherently not codable concepts without codable children: Secondary | ICD-10-CM

## 2013-03-02 DIAGNOSIS — E876 Hypokalemia: Secondary | ICD-10-CM | POA: Diagnosis not present

## 2013-03-02 DIAGNOSIS — R4182 Altered mental status, unspecified: Secondary | ICD-10-CM

## 2013-03-02 DIAGNOSIS — C220 Liver cell carcinoma: Secondary | ICD-10-CM

## 2013-03-02 DIAGNOSIS — D62 Acute posthemorrhagic anemia: Secondary | ICD-10-CM | POA: Diagnosis not present

## 2013-03-02 DIAGNOSIS — C229 Malignant neoplasm of liver, not specified as primary or secondary: Secondary | ICD-10-CM

## 2013-03-02 DIAGNOSIS — G8918 Other acute postprocedural pain: Secondary | ICD-10-CM | POA: Diagnosis not present

## 2013-03-02 DIAGNOSIS — K560 Paralytic ileus: Secondary | ICD-10-CM | POA: Diagnosis not present

## 2013-03-02 DIAGNOSIS — Z87891 Personal history of nicotine dependence: Secondary | ICD-10-CM

## 2013-03-02 HISTORY — PX: OPEN PARTIAL HEPATECTOMY [83]: SHX5987

## 2013-03-02 HISTORY — PX: LAPAROSCOPY: SHX197

## 2013-03-02 HISTORY — PX: LIVER ULTRASOUND: SHX5952

## 2013-03-02 LAB — GLUCOSE, CAPILLARY
Glucose-Capillary: 233 mg/dL — ABNORMAL HIGH (ref 70–99)
Glucose-Capillary: 293 mg/dL — ABNORMAL HIGH (ref 70–99)
Glucose-Capillary: 367 mg/dL — ABNORMAL HIGH (ref 70–99)
Glucose-Capillary: 319 mg/dL — ABNORMAL HIGH (ref 70–99)
Glucose-Capillary: 350 mg/dL — ABNORMAL HIGH (ref 70–99)
Glucose-Capillary: 289 mg/dL — ABNORMAL HIGH (ref 70–99)

## 2013-03-02 LAB — HEPATITIS B DNA, ULTRAQUANTITATIVE, PCR
Hepatitis B DNA (Calc): <116 copies/mL (ref <116)
Hepatitis B DNA: <20 IU/mL (ref <20)

## 2013-03-02 LAB — MRSA PCR SCREENING: MRSA by PCR: NEGATIVE

## 2013-03-02 LAB — PREPARE RBC (CROSSMATCH)

## 2013-03-02 SURGERY — LAPAROSCOPY, DIAGNOSTIC
Anesthesia: General | Site: Abdomen | Wound class: Clean Contaminated

## 2013-03-02 MED ORDER — PROPOFOL 10 MG/ML IV BOLUS
INTRAVENOUS | Status: DC | PRN
  Administered 2013-03-02: 200 mg via INTRAVENOUS

## 2013-03-02 MED ORDER — ONDANSETRON HCL 4 MG/2ML IJ SOLN: 4.0000 mg | Freq: Four times a day (QID) | INTRAMUSCULAR | Status: DC | PRN

## 2013-03-02 MED ORDER — ACETAMINOPHEN 10 MG/ML IV SOLN
1000.0000 mg | Freq: Three times a day (TID) | INTRAVENOUS | Status: AC
  Administered 2013-03-02 – 2013-03-03 (×3): 1000 mg via INTRAVENOUS
  Filled 2013-03-02 (×4): qty 100

## 2013-03-02 MED ORDER — ESMOLOL HCL 10 MG/ML IV SOLN
INTRAVENOUS | Status: DC | PRN
  Administered 2013-03-02: 15 ug via INTRAVENOUS
  Administered 2013-03-02: 10 ug via INTRAVENOUS

## 2013-03-02 MED ORDER — SODIUM CHLORIDE 0.9 % IV SOLN
INTRAVENOUS | Status: DC
  Administered 2013-03-02: 2.8 [IU]/h via INTRAVENOUS
  Administered 2013-03-03: 8.8 [IU]/h via INTRAVENOUS
  Filled 2013-03-02 (×2): qty 1

## 2013-03-02 MED ORDER — LACTATED RINGERS IV SOLN: INTRAVENOUS | Status: DC

## 2013-03-02 MED ORDER — CISATRACURIUM BESYLATE (PF) 10 MG/5ML IV SOLN
INTRAVENOUS | Status: DC | PRN
  Administered 2013-03-02: 2 mg via INTRAVENOUS
  Administered 2013-03-02 (×2): 4 mg via INTRAVENOUS
  Administered 2013-03-02: 2 mg via INTRAVENOUS

## 2013-03-02 MED ORDER — SPIRONOLACTONE 25 MG PO TABS
25.0000 mg | ORAL_TABLET | Freq: Two times a day (BID) | ORAL | Status: DC
  Administered 2013-03-02: 25 mg via ORAL
  Filled 2013-03-02 (×3): qty 1

## 2013-03-02 MED ORDER — NALOXONE HCL 0.4 MG/ML IJ SOLN: 0.4000 mg | INTRAMUSCULAR | Status: DC | PRN

## 2013-03-02 MED ORDER — THROMBIN 5000 UNITS EX SOLR
CUTANEOUS | Status: AC
  Filled 2013-03-02: qty 10000

## 2013-03-02 MED ORDER — MORPHINE SULFATE 2 MG/ML IJ SOLN
1.0000 mg | INTRAMUSCULAR | Status: DC | PRN
Start: 2013-03-02 — End: 2013-03-09
  Administered 2013-03-03 – 2013-03-06 (×3): 2 mg via INTRAVENOUS
  Filled 2013-03-02 (×3): qty 1

## 2013-03-02 MED ORDER — MICROFIBRILLAR COLL HEMOSTAT EX POWD
CUTANEOUS | Status: AC
  Filled 2013-03-02: qty 5

## 2013-03-02 MED ORDER — HYDROMORPHONE HCL PF 1 MG/ML IJ SOLN
INTRAMUSCULAR | Status: AC
  Filled 2013-03-02: qty 1

## 2013-03-02 MED ORDER — INSULIN ASPART 100 UNIT/ML ~~LOC~~ SOLN
SUBCUTANEOUS | Status: DC | PRN
  Administered 2013-03-02: 4 [IU] via INTRAVENOUS

## 2013-03-02 MED ORDER — AMPHETAMINE-DEXTROAMPHET ER 20 MG PO CP24
20.0000 mg | ORAL_CAPSULE | Freq: Every day | ORAL | Status: DC
  Administered 2013-03-03 – 2013-03-08 (×6): 20 mg via ORAL
  Filled 2013-03-02 (×11): qty 1

## 2013-03-02 MED ORDER — LACTATED RINGERS IV SOLN
INTRAVENOUS | Status: DC | PRN
  Administered 2013-03-02 (×2): via INTRAVENOUS

## 2013-03-02 MED ORDER — SODIUM CHLORIDE 0.9 % IJ SOLN: 9.0000 mL | INTRAMUSCULAR | Status: DC | PRN

## 2013-03-02 MED ORDER — AMPHETAMINE-DEXTROAMPHET ER 30 MG PO CP24: 30.0000 mg | ORAL_CAPSULE | Freq: Every morning | ORAL | Status: DC

## 2013-03-02 MED ORDER — BUPIVACAINE HCL (PF) 0.25 % IJ SOLN
INTRAMUSCULAR | Status: DC | PRN
  Administered 2013-03-02: 14.5 mL

## 2013-03-02 MED ORDER — LACTATED RINGERS IV SOLN
INTRAVENOUS | Status: DC | PRN
  Administered 2013-03-02 (×2): via INTRAVENOUS

## 2013-03-02 MED ORDER — LIDOCAINE-EPINEPHRINE (PF) 1 %-1:200000 IJ SOLN
INTRAMUSCULAR | Status: AC
  Filled 2013-03-02: qty 10

## 2013-03-02 MED ORDER — NEOSTIGMINE METHYLSULFATE 1 MG/ML IJ SOLN
INTRAMUSCULAR | Status: DC | PRN
  Administered 2013-03-02: 4 mg via INTRAVENOUS

## 2013-03-02 MED ORDER — GLYCOPYRROLATE 0.2 MG/ML IJ SOLN
INTRAMUSCULAR | Status: DC | PRN
  Administered 2013-03-02: .6 mg via INTRAVENOUS

## 2013-03-02 MED ORDER — CHLORHEXIDINE GLUCONATE 4 % EX LIQD
1.0000 "application " | Freq: Once | CUTANEOUS | Status: DC
  Filled 2013-03-02: qty 15

## 2013-03-02 MED ORDER — LIDOCAINE-EPINEPHRINE (PF) 1 %-1:200000 IJ SOLN
INTRAMUSCULAR | Status: DC | PRN
  Administered 2013-03-02: 14.5 mL

## 2013-03-02 MED ORDER — FLEET ENEMA 7-19 GM/118ML RE ENEM: 1.0000 | ENEMA | Freq: Once | RECTAL | Status: DC

## 2013-03-02 MED ORDER — DEXTROSE 50 % IV SOLN: 25.0000 mL | INTRAVENOUS | Status: DC | PRN

## 2013-03-02 MED ORDER — HYDROMORPHONE HCL PF 1 MG/ML IJ SOLN
INTRAMUSCULAR | Status: DC | PRN
  Administered 2013-03-02: 1 mg via INTRAVENOUS
  Administered 2013-03-02 (×2): 0.5 mg via INTRAVENOUS

## 2013-03-02 MED ORDER — TISSEEL VH 10 ML EX KIT
PACK | CUTANEOUS | Status: AC
  Filled 2013-03-02: qty 2

## 2013-03-02 MED ORDER — MORPHINE SULFATE (PF) 1 MG/ML IV SOLN
INTRAVENOUS | Status: AC
  Filled 2013-03-02: qty 25

## 2013-03-02 MED ORDER — MORPHINE SULFATE (PF) 1 MG/ML IV SOLN
INTRAVENOUS | Status: DC
  Administered 2013-03-02: 1.5 mg via INTRAVENOUS
  Administered 2013-03-02: 15:00:00 via INTRAVENOUS
  Administered 2013-03-03: 10.5 mg via INTRAVENOUS
  Administered 2013-03-03: 6.6 mg via INTRAVENOUS
  Administered 2013-03-03 (×2): via INTRAVENOUS
  Administered 2013-03-03: 10.5 mg via INTRAVENOUS
  Administered 2013-03-03: 15 mg via INTRAVENOUS
  Administered 2013-03-03: 8.05 mg via INTRAVENOUS
  Administered 2013-03-04: 9 mg via INTRAVENOUS
  Administered 2013-03-04: 12 mg via INTRAVENOUS
  Administered 2013-03-04: 4.35 mg via INTRAVENOUS
  Administered 2013-03-04: 09:00:00 via INTRAVENOUS
  Filled 2013-03-02 (×3): qty 25

## 2013-03-02 MED ORDER — BIOTENE DRY MOUTH MT LIQD
15.0000 mL | Freq: Two times a day (BID) | OROMUCOSAL | Status: DC
  Administered 2013-03-02 – 2013-03-06 (×7): 15 mL via OROMUCOSAL

## 2013-03-02 MED ORDER — AMPHETAMINE-DEXTROAMPHET ER 10 MG PO CP24: 30.0000 mg | ORAL_CAPSULE | Freq: Every morning | ORAL | Status: DC

## 2013-03-02 MED ORDER — ONDANSETRON HCL 4 MG/2ML IJ SOLN
INTRAMUSCULAR | Status: DC | PRN
  Administered 2013-03-02: 4 mg via INTRAVENOUS

## 2013-03-02 MED ORDER — 0.9 % SODIUM CHLORIDE (POUR BTL) OPTIME
TOPICAL | Status: DC | PRN
  Administered 2013-03-02: 2000 mL
  Administered 2013-03-02: 3000 mL

## 2013-03-02 MED ORDER — DIPHENHYDRAMINE HCL 50 MG/ML IJ SOLN: 12.5000 mg | Freq: Four times a day (QID) | INTRAMUSCULAR | Status: DC | PRN

## 2013-03-02 MED ORDER — PROMETHAZINE HCL 25 MG/ML IJ SOLN: 6.2500 mg | INTRAMUSCULAR | Status: DC | PRN

## 2013-03-02 MED ORDER — DEXTROSE 5 % IV SOLN
1.0000 g | Freq: Four times a day (QID) | INTRAVENOUS | Status: AC
  Administered 2013-03-02 – 2013-03-03 (×3): 1 g via INTRAVENOUS
  Filled 2013-03-02 (×3): qty 1

## 2013-03-02 MED ORDER — DEXTROSE 5 % IV SOLN
2.0000 g | INTRAVENOUS | Status: AC
  Administered 2013-03-02: 2 g via INTRAVENOUS

## 2013-03-02 MED ORDER — HYDROMORPHONE HCL PF 1 MG/ML IJ SOLN
0.2500 mg | INTRAMUSCULAR | Status: DC | PRN
  Administered 2013-03-02 (×2): 0.5 mg via INTRAVENOUS

## 2013-03-02 MED ORDER — TISSEEL VH 10 ML EX KIT
PACK | CUTANEOUS | Status: DC | PRN
  Administered 2013-03-02: 1

## 2013-03-02 MED ORDER — BUPIVACAINE ON-Q PAIN PUMP (FOR ORDER SET NO CHG)
INJECTION | Status: AC
  Filled 2013-03-02: qty 1

## 2013-03-02 MED ORDER — ALBUTEROL SULFATE (5 MG/ML) 0.5% IN NEBU
2.5000 mg | INHALATION_SOLUTION | RESPIRATORY_TRACT | Status: DC | PRN
  Filled 2013-03-02 (×2): qty 0.5

## 2013-03-02 MED ORDER — SUFENTANIL CITRATE 50 MCG/ML IV SOLN
INTRAVENOUS | Status: DC | PRN
  Administered 2013-03-02 (×2): 5 ug via INTRAVENOUS
  Administered 2013-03-02 (×2): 10 ug via INTRAVENOUS
  Administered 2013-03-02: 5 ug via INTRAVENOUS
  Administered 2013-03-02 (×2): 10 ug via INTRAVENOUS
  Administered 2013-03-02 (×2): 5 ug via INTRAVENOUS
  Administered 2013-03-02 (×2): 10 ug via INTRAVENOUS

## 2013-03-02 MED ORDER — BUPIVACAINE 0.25 % ON-Q PUMP DUAL CATH 300 ML
300.0000 mL | INJECTION | Status: DC
  Administered 2013-03-02: 300 mL
  Filled 2013-03-02: qty 300

## 2013-03-02 MED ORDER — DEXTROSE-NACL 5-0.45 % IV SOLN
INTRAVENOUS | Status: DC
  Administered 2013-03-03: 03:00:00 via INTRAVENOUS

## 2013-03-02 MED ORDER — HYDROCHLOROTHIAZIDE 25 MG PO TABS: 12.5000 mg | ORAL_TABLET | Freq: Every morning | ORAL | Status: DC

## 2013-03-02 MED ORDER — MIDAZOLAM HCL 5 MG/5ML IJ SOLN
INTRAMUSCULAR | Status: DC | PRN
  Administered 2013-03-02: 2 mg via INTRAVENOUS

## 2013-03-02 MED ORDER — BUPIVACAINE HCL (PF) 0.25 % IJ SOLN
INTRAMUSCULAR | Status: AC
  Filled 2013-03-02: qty 30

## 2013-03-02 MED ORDER — ALBUTEROL SULFATE (5 MG/ML) 0.5% IN NEBU
2.5000 mg | INHALATION_SOLUTION | Freq: Four times a day (QID) | RESPIRATORY_TRACT | Status: DC
  Administered 2013-03-02 – 2013-03-09 (×28): 2.5 mg via RESPIRATORY_TRACT
  Filled 2013-03-02 (×26): qty 0.5

## 2013-03-02 MED ORDER — AMPHETAMINE-DEXTROAMPHET ER 10 MG PO CP24
10.0000 mg | ORAL_CAPSULE | Freq: Every day | ORAL | Status: DC
  Administered 2013-03-03 – 2013-03-08 (×5): 10 mg via ORAL
  Filled 2013-03-02 (×8): qty 1

## 2013-03-02 MED ORDER — LIDOCAINE HCL (CARDIAC) 20 MG/ML IV SOLN
INTRAVENOUS | Status: DC | PRN
  Administered 2013-03-02: 50 mg via INTRAVENOUS

## 2013-03-02 MED ORDER — INSULIN REGULAR BOLUS VIA INFUSION
0.0000 [IU] | Freq: Three times a day (TID) | INTRAVENOUS | Status: DC
  Filled 2013-03-02 (×2): qty 10

## 2013-03-02 MED ORDER — CEFOXITIN SODIUM-DEXTROSE 1-4 GM-% IV SOLR (PREMIX)
INTRAVENOUS | Status: AC
  Filled 2013-03-02: qty 100

## 2013-03-02 MED ORDER — DIPHENHYDRAMINE HCL 12.5 MG/5ML PO ELIX: 12.5000 mg | ORAL_SOLUTION | Freq: Four times a day (QID) | ORAL | Status: DC | PRN

## 2013-03-02 MED ORDER — PHENYLEPHRINE HCL 10 MG/ML IJ SOLN
INTRAMUSCULAR | Status: DC | PRN
  Administered 2013-03-02 (×2): 80 ug via INTRAVENOUS

## 2013-03-02 MED ORDER — INSULIN ASPART 100 UNIT/ML ~~LOC~~ SOLN
0.0000 [IU] | SUBCUTANEOUS | Status: DC
  Administered 2013-03-02: 3 [IU] via SUBCUTANEOUS
  Filled 2013-03-02: qty 1

## 2013-03-02 MED ORDER — ROCURONIUM BROMIDE 100 MG/10ML IV SOLN
INTRAVENOUS | Status: DC | PRN
  Administered 2013-03-02: 50 mg via INTRAVENOUS

## 2013-03-02 MED ORDER — IPRATROPIUM BROMIDE 0.02 % IN SOLN
0.5000 mg | Freq: Four times a day (QID) | RESPIRATORY_TRACT | Status: DC
  Administered 2013-03-02 – 2013-03-09 (×28): 0.5 mg via RESPIRATORY_TRACT
  Filled 2013-03-02 (×28): qty 2.5

## 2013-03-02 MED ORDER — AMLODIPINE BESYLATE 10 MG PO TABS
10.0000 mg | ORAL_TABLET | Freq: Every morning | ORAL | Status: DC
  Administered 2013-03-03 – 2013-03-08 (×6): 10 mg via ORAL
  Filled 2013-03-02 (×6): qty 1

## 2013-03-02 MED ORDER — SODIUM CHLORIDE 0.9 % IV SOLN
INTRAVENOUS | Status: DC | PRN
  Administered 2013-03-02: 10:00:00 via INTRAVENOUS

## 2013-03-02 MED ORDER — SODIUM CHLORIDE 0.9 % IV SOLN
INTRAVENOUS | Status: DC
  Administered 2013-03-02 – 2013-03-03 (×2): via INTRAVENOUS

## 2013-03-02 SURGICAL SUPPLY — 110 items
BLADE EXTENDED COATED 6.5IN (ELECTRODE) ×3 IMPLANT
BLADE HEX COATED 2.75 (ELECTRODE) ×3 IMPLANT
BLADE SURG SZ10 CARB STEEL (BLADE) ×3 IMPLANT
BOOT SUTURE VASCULAR YLW (MISCELLANEOUS)
CANISTER SUCTION 2500CC (MISCELLANEOUS) IMPLANT
CATH KIT ON Q 7.5IN SLV (PAIN MANAGEMENT) IMPLANT
CATH KIT ON-Q SILVERSOAK 5IN (CATHETERS) IMPLANT
CATH KIT ON-Q SILVERSOAK 7.5IN (CATHETERS) ×6 IMPLANT
CHLORAPREP W/TINT 10.5 ML (MISCELLANEOUS) ×3 IMPLANT
CHLORAPREP W/TINT 26ML (MISCELLANEOUS) ×3 IMPLANT
CLAMP SUTURE YELLOW 5 PAIRS (MISCELLANEOUS) IMPLANT
CLIP LIGATING HEM O LOK PURPLE (MISCELLANEOUS) IMPLANT
CLIP LIGATING HEMO O LOK GREEN (MISCELLANEOUS) IMPLANT
CLIP LIGATING HEMOLOK MED (MISCELLANEOUS) IMPLANT
CLIP TI LARGE 6 (CLIP) ×12 IMPLANT
CLIP TI MEDIUM 6 (CLIP) ×9 IMPLANT
CLOTH BEACON ORANGE TIMEOUT ST (SAFETY) ×3 IMPLANT
CONT SPECI 4OZ STER CLIK (MISCELLANEOUS) ×3 IMPLANT
COVER MAYO STAND STRL (DRAPES) ×3 IMPLANT
DECANTER SPIKE VIAL GLASS SM (MISCELLANEOUS) ×3 IMPLANT
DISSECTOR ROUND CHERRY 3/8 STR (MISCELLANEOUS) IMPLANT
DRAIN CHANNEL 19F RND (DRAIN) ×3 IMPLANT
DRAPE C-ARM 42X120 X-RAY (DRAPES) IMPLANT
DRAPE CAMERA CLOSED 9X96 (DRAPES) ×3 IMPLANT
DRAPE LG THREE QUARTER DISP (DRAPES) IMPLANT
DRAPE POUCH INSTRU U-SHP 10X18 (DRAPES) ×3 IMPLANT
DRAPE TABLE BACK 44X90 PK DISP (DRAPES) ×3 IMPLANT
DRAPE UTILITY XL STRL (DRAPES) ×6 IMPLANT
DRAPE WARM FLUID 44X44 (DRAPE) ×3 IMPLANT
DRESSING SURGICEL FIBRLLR 1X2 (HEMOSTASIS) ×2 IMPLANT
DRESSING TELFA ISLAND 4X8 (GAUZE/BANDAGES/DRESSINGS) ×3 IMPLANT
DRSG PAD ABDOMINAL 8X10 ST (GAUZE/BANDAGES/DRESSINGS) IMPLANT
DRSG SURGICEL FIBRILLAR 1X2 (HEMOSTASIS) ×3
DRSG TELFA 4X10 ISLAND STR (GAUZE/BANDAGES/DRESSINGS) ×3 IMPLANT
DRSG TELFA PLUS 4X6 ADH ISLAND (GAUZE/BANDAGES/DRESSINGS) IMPLANT
EVACUATOR SILICONE 100CC (DRAIN) ×3 IMPLANT
GAUZE SPONGE 4X4 16PLY XRAY LF (GAUZE/BANDAGES/DRESSINGS) ×3 IMPLANT
GLOVE BIO SURGEON STRL SZ 6 (GLOVE) ×3 IMPLANT
GLOVE INDICATOR 6.5 STRL GRN (GLOVE) ×6 IMPLANT
GOWN PREVENTION PLUS XLARGE (GOWN DISPOSABLE) ×9 IMPLANT
GOWN PREVENTION PLUS XXLARGE (GOWN DISPOSABLE) ×3 IMPLANT
HEMOSTAT SURGICEL 4X8 (HEMOSTASIS) IMPLANT
KIT BASIN OR (CUSTOM PROCEDURE TRAY) ×3 IMPLANT
LOOP MINI RED (MISCELLANEOUS) IMPLANT
LOOP VESSEL MAXI BLUE (MISCELLANEOUS) IMPLANT
NEEDLE BIOPSY 14GX4.5 SOFT TIS (NEEDLE) IMPLANT
NEEDLE HYPO 22GX1.5 SAFETY (NEEDLE) ×3 IMPLANT
NS IRRIG 1000ML POUR BTL (IV SOLUTION) ×6 IMPLANT
PACK GENERAL/GYN (CUSTOM PROCEDURE TRAY) IMPLANT
PACK UNIVERSAL I (CUSTOM PROCEDURE TRAY) ×3 IMPLANT
RELOAD WHITE ECR60W (STAPLE) ×6 IMPLANT
SCISSORS HARMONIC WAVE 18CM (INSTRUMENTS) IMPLANT
SET IRRIG TUBING LAPAROSCOPIC (IRRIGATION / IRRIGATOR) IMPLANT
SHEARS FOC LG CVD HARMONIC 17C (MISCELLANEOUS) IMPLANT
SLEEVE SURGEON STRL (DRAPES) IMPLANT
SLEEVE XCEL OPT CAN 5 100 (ENDOMECHANICALS) IMPLANT
SPONGE DRAIN TRACH 4X4 STRL 2S (GAUZE/BANDAGES/DRESSINGS) IMPLANT
SPONGE GAUZE 4X4 12PLY (GAUZE/BANDAGES/DRESSINGS) ×3 IMPLANT
SPONGE LAP 18X18 X RAY DECT (DISPOSABLE) ×15 IMPLANT
SPONGE SURGIFOAM ABS GEL 100 (HEMOSTASIS) IMPLANT
STAPLE ECHEON FLEX 60 POW ENDO (STAPLE) ×3 IMPLANT
STAPLER VISISTAT 35W (STAPLE) ×3 IMPLANT
SUCTION POOLE TIP (SUCTIONS) ×3 IMPLANT
SUT CHROMIC 3 0 SH 27 (SUTURE) IMPLANT
SUT CHROMIC 4 0 RB 1X27 (SUTURE) IMPLANT
SUT ETHIBOND 2-0 60INL (SUTURE) IMPLANT
SUT ETHILON 2 0 PS N (SUTURE) IMPLANT
SUT MNCRL AB 4-0 PS2 18 (SUTURE) IMPLANT
SUT PDS AB 1 TP1 54 (SUTURE) IMPLANT
SUT PDS AB 1 TP1 96 (SUTURE) ×15 IMPLANT
SUT PROLENE 3 0 SH 48 (SUTURE) IMPLANT
SUT PROLENE 3 0 SH1 36 (SUTURE) ×6 IMPLANT
SUT PROLENE 4 0 RB 1 (SUTURE) ×3
SUT PROLENE 4-0 RB1 .5 CRCL 36 (SUTURE) ×2 IMPLANT
SUT PROLENE 5 0 CC 1 (SUTURE) IMPLANT
SUT SILK 0 FSL (SUTURE) ×3 IMPLANT
SUT SILK 2 0 (SUTURE) ×3
SUT SILK 2 0 SH CR/8 (SUTURE) ×3 IMPLANT
SUT SILK 2-0 18XBRD TIE 12 (SUTURE) ×2 IMPLANT
SUT SILK 3 0 (SUTURE) ×3
SUT SILK 3 0 SH CR/8 (SUTURE) ×3 IMPLANT
SUT SILK 3-0 18XBRD TIE 12 (SUTURE) ×2 IMPLANT
SUT VIC AB 2-0 CT1 27 (SUTURE) ×12
SUT VIC AB 2-0 CT1 TAPERPNT 27 (SUTURE) ×8 IMPLANT
SUT VIC AB 3-0 SH 18 (SUTURE) IMPLANT
SUT VIC AB 4-0 SH 18 (SUTURE) IMPLANT
SUT VICRYL 0 UR6 27IN ABS (SUTURE) ×6 IMPLANT
SUT VICRYL 2 0 18  UND BR (SUTURE) ×1
SUT VICRYL 2 0 18 UND BR (SUTURE) ×2 IMPLANT
SUT VICRYL 3 0 BR 18  UND (SUTURE) ×1
SUT VICRYL 3 0 BR 18 UND (SUTURE) ×2 IMPLANT
SYR CONTROL 10ML LL (SYRINGE) ×3 IMPLANT
SYRINGE 10CC LL (SYRINGE) IMPLANT
SYS LAPSCP GELPORT 120MM (MISCELLANEOUS)
SYSTEM LAPSCP GELPORT 120MM (MISCELLANEOUS) IMPLANT
TAG SUTURE CLAMP YLW 5PR (MISCELLANEOUS)
TAPE CLOTH SURG 6X10 WHT LF (GAUZE/BANDAGES/DRESSINGS) ×3 IMPLANT
TAPE UMBILICAL COTTON 1/8X30 (MISCELLANEOUS) IMPLANT
TOWEL BLUE STERILE X RAY DET (MISCELLANEOUS) IMPLANT
TOWEL OR 17X26 10 PK STRL BLUE (TOWEL DISPOSABLE) ×12 IMPLANT
TRAY FOLEY CATH 14FRSI W/METER (CATHETERS) ×3 IMPLANT
TROCAR BLADELESS OPT 5 100 (ENDOMECHANICALS) IMPLANT
TROCAR BLADELESS OPT 5 75 (ENDOMECHANICALS) IMPLANT
TROCAR XCEL 12X100 BLDLESS (ENDOMECHANICALS) ×3 IMPLANT
TROCAR XCEL BLUNT TIP 100MML (ENDOMECHANICALS) IMPLANT
TROCAR XCEL NON-BLD 11X100MML (ENDOMECHANICALS) IMPLANT
TROCAR XCEL NON-BLD 5MMX100MML (ENDOMECHANICALS) ×6 IMPLANT
TUBING INSUFFLATION 10FT LAP (TUBING) IMPLANT
TUNNELER SHEATH ON-Q 16GX12 DP (PAIN MANAGEMENT) ×3 IMPLANT
YANKAUER SUCT BULB TIP NO VENT (SUCTIONS) ×3 IMPLANT

## 2013-03-02 NOTE — Op Note (Signed)
PREOPERATIVE DIAGNOSIS:  Hepatocellular cancer.  POSTOPERATIVE DIAGNOSIS:  same  PROCEDURE PERFORMED:  Diagnostic laparoscopy, intraoperative liver ultrasound, partial left hepatic lobectomy  SURGEON:  Almond Lint, MD  ASSISTANT:  Jaclynn Guarneri, MD  ANESTHESIA:  General and local.  FINDINGS:  Omentum walled off tumor.  No evidence of peritoneal carcinomatosis.  Necrotic mass in segment IV.  SPECIMEN:  Partial left lobe of the liver and omentum to Pathology.   ESTIMATED BLOOD LOSS:  500 mL.  COMPLICATIONS:  Unknown.  PROCEDURE:  Patient was identified in the holding area and taken to the operating room where he was placed supine on the operating room table.  General endotracheal anesthesia was induced.  His abdomen was prepped and draped in a sterile fashion.  A time-out was performed according to the surgical safety check list.  When all was correct, we continued.  The left subcostal region was anesthetized with local anesthetic and a small 6 mm incision was made with a #11 blade. Optiview port was placed without difficulty.  Pneumoperitoneum was achieved.  The camera was placed in the abdomen and no overt signs of carcinomatosis were seen.    The lesion in the left lobe was covered by omentum.   The right lobe of the liver was not able to be visualized.  Two additional ports were placed, a 12 mm in the supraumbilical location, and a 5 mm RUQ incision.  Attempts were made to take the adhesions.  This was taking asignificant amount of time, and bleeding was encountered, so a decision was made to open.  The right lobe of the liver was cirrhotic.  The right lobe of the liver was able to be visualized in this location, and there were no signs of carcinomatosis there either.  The liver was scanned sequentially with the ultrasound.  No additional lesions were seen.   A chevron incision was made with a #10 blade.  The subcutaneous tissues were divided with the Bovie and then the fascia  was opened with the Bovie as well. There were several spots on the muscle that were coagulated.  The Bookwalter was then placed for assistance with visualization. The omentum, the colon, and the stomach were adherent to the liver.  The omentum was divided with the harmonic scalpel in order to leave the omentum adherent to the tumor in place.  The triangular ligament was taken down to the left.    The tumor was very superficial.  Decision was made to do sectorectomy. The liver was scored with the cautery.   The Aquamantys was used to assist with the division of the liver parenchyma.  The smaller pedicles were ligated with clips and divided.  The largest vascular pedicles were divided with Echelon staple loads.  The liver was taken completely off.  No bile leakage was seen. The argon beam coagulator was used to coagulate the liver bed.  A 3-0 prolene was used to control liver bleeding.   Water irrigation was used.    A 19 Blake drain was placed in the abdomen. There was still no bile leakage seen after waiting and evaluating the liver bed with a dry wrap.  Tisseel followed by fibrillar was then applied to the liver edge and the Blake drain was placed going around the cut edge of the surface and over into the hepatic fossa.  The omentum was also placed up over the top of the colon into this area.  The OnQ catheters were inserted into the preperitoneal location from the upper  midline.    The fascia was closed in 2 layers in the lateral direction with  #1 looped PDS suture in a running fashion.   The Standard drain was then in place with a 2-0 nylon.  The skin was then irrigated and stapled.  One of the laparoscopic port sites were used for the drain.  The fascia of the umbilical port was closed with 0-0 vicryl.  The wounds were cleaned, dried and dressed with covaderm.    The patient was extubated and taken to PACU in stable condition.     Almond Lint, MD

## 2013-03-02 NOTE — Consult Note (Signed)
PULMONARY  / CRITICAL CARE MEDICINE  Name: [name] MRN: [id number] DOB: 03-09-1946    ADMISSION DATE:  03/02/2013 CONSULTATION DATE:  7/14  REFERRING MD :  [name] PRIMARY SERVICE: CCS  CHIEF COMPLAINT:  S/P resection of hepatocellular carcinoma  BRIEF PATIENT DESCRIPTION: 75 M admitted to ICU after elective resection of hepatocellular carcinoma with post op somnolence/narcosis and compromised resp status. PCCM asked to assist in monitoring and mgmt. PMH of OSA, narcolepsy, DMII, former smoker. 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 Level 5 caveat. Hosp records reviewed. No family available  PAST MEDICAL HISTORY :  Past Medical History  Diagnosis Date  . Diabetes mellitus without complication   . Hypertension   . Hyperlipidemia   . Cancer     hepatocellular cancer   . Hepatitis   . Narcolepsy     per office visit note of 08/2011   . Sleep apnea    Past Surgical History  Procedure Laterality Date  . Pinched nerve in back     Prior to Admission medications   Medication Sig Start Date End Date Taking? Authorizing Provider  alfuzosin (UROXATRAL) 10 MG 24 hr tablet Take 10 mg by mouth at bedtime.  12/19/12  Yes Historical Provider, MD  amLODipine (NORVASC) 10 MG tablet Take 10 mg by mouth every morning. 09/08/12 09/08/13 Yes Edward Flood, MD  amphetamine-dextroamphetamine (ADDERALL XR) 30 MG 24 hr capsule Take 30 mg by mouth every morning.   Yes Historical Provider, MD  hydrochlorothiazide (HYDRODIURIL) 12.5 MG tablet Take 12.5 mg by mouth every morning. 09/08/12  Yes Edward Flood, MD  losartan (COZAAR) 25 MG tablet Take 25 mg by mouth every morning. 09/08/12 09/08/13 Yes Edward Flood, MD  pravastatin (PRAVACHOL) 20 MG tablet Take 1 tablet  (20 mg total) by mouth at bedtime. 09/08/12  Yes Edward Flood, MD  Sodium Oxybate (XYREM PO) Take 4.5 mg by mouth at bedtime as needed (for sleep).   Yes Historical Provider, MD  spironolactone (ALDACTONE) 25 MG tablet Take 1 tablet (25 mg total) by mouth 2 (two) times daily. 01/22/13  Yes Edward Flood, MD  aspirin 81 MG tablet Take 81 mg by mouth every morning.     Historical Provider, MD  metFORMIN (GLUCOPHAGE) 1000 MG tablet Take 1 tablet (1,000 mg total) by mouth 2 (two) times daily with a meal. 09/08/12   Edward Flood, MD   Allergies  Allergen Reactions  . Ace Inhibitors Swelling    Angioedema - face.     FAMILY HISTORY:  Family History  Problem Relation Age of Onset  . Dementia Mother   . Cancer Father   . Diabetes Brother   . Hypertension Brother   . Cancer Brother    SOCIAL HISTORY:  reports that he quit smoking about 2 years ago. He has never used smokeless tobacco. He reports that he does not drink alcohol or use illicit drugs.  REVIEW OF SYSTEMS:  Level 5 caveat  SUBJECTIVE:   VITAL SIGNS: Temp:  [97.4 F (36.3 C)-97.6 F (36.4 C)] 97.6 F (36.4 C) (07/14 2000) Pulse Rate:  [97-109] 97 (07/14 2000) Resp:  [7-14] 11 (07/14 2000) BP: (84-131)/(50-87) 115/50 mmHg (07/14 2000) SpO2:  [34 %-99 %] 97 % (07/14 2000) Arterial Line BP: (  71-105)/(45-58) 94/47 mmHg (07/14 1900) Weight:  [100 kg (220 lb 7.4 oz)] 100 kg (220 lb 7.4 oz) (07/14 1720) HEMODYNAMICS:   VENTILATOR SETTINGS:   INTAKE / OUTPUT: Intake/Output     07/14 0701 - 07/15 0700   I.V. (mL/kg) 4091 (40.9)   Blood 1050   IV Piggyback 150   Total Intake(mL/kg) 5291 (52.9)   Urine (mL/kg/hr) 555 (0.4)   Drains 65 (0)   Blood 950 (0.6)   Total Output 1570   Net +3721         PHYSICAL EXAMINATION: General:  WDWN, very somnolent, snoring respirations, NAD Neuro: RASS -3, MAEs, not F/C HEENT:  Pinpoint pupils, o/w WNL Cardiovascular: RRR s M Lungs: clear anteriorly without  wheezes Abdomen: surgical dressings noted, OnQ catheters noted, mildly diffusely tender Ext: warm, without edema   LABS:  Recent Labs Lab 02/25/13 0825  HGB 9.9*  WBC 3.5*  PLT 215  NA 136  K 4.0  CL 101  CO2 25  GLUCOSE 203*  BUN 12  CREATININE 1.03  CALCIUM 9.2  AST 45*  ALT 40  ALKPHOS 72  BILITOT 0.4  PROT 8.2  ALBUMIN 3.6  APTT 29  INR 1.09    Recent Labs Lab 03/02/13 1438 03/02/13 1732 03/02/13 1844 03/02/13 1935 03/02/13 2049  GLUCAP 293* 367* 319* 350* 289*    CXR:   ASSESSMENT / PLAN:  PULMONARY A: Post op respiratory compromise due to narcosis/ prolonged anesthesia Former smoker H/O OSA - unclear if treated P:   Supplemental O2 Monitor closely with oximetry and capnography while on PCA Empiric BDs Consider CPAP if he uses this at home   CARDIOVASCULAR A: H/O Htn  P:  Cont home meds  RENAL A:  No issues P:   Monitor BMET intermittently Correct electrolytes as indicated  GASTROINTESTINAL A:  P lap, partial L hepatic lobectomy P:   Post op mgmt per CCS Nutrition per CCS  HEMATOLOGIC A:  No issues P:  Monitor CBC intermittently  INFECTIOUS A:  No issues P:   Monitor  ENDOCRINE A:  DMII Post op hyperglycemia P:   Insulin infusion/Glucostabilizer ordered per Dr [name]  NEUROLOGIC A: Post op narcosis - opioids/prolonged anesthesia H/O narcolepsy Post op pain P:   Cont Adderal PCA opioids   TODAY'S SUMMARY:   I have personally obtained a history, examined the patient, evaluated laboratory and imaging results, formulated the assessment and plan and placed orders.   Edward Fischer, MD ; [facility] [phone].  After 5:30 PM or weekends, call [phone] Pulmonary and Critical Care Medicine [facility] Pager: [phone]  03/02/2013, 10:04 PM

## 2013-03-02 NOTE — Transfer of Care (Signed)
Immediate Anesthesia Transfer of Care Note  Patient: NIHAR KLUS  Procedure(s) Performed: Procedure(s) with comments: LAPAROSCOPY DIAGNOSTIC LIVER ULTRASOUND OPEN PARTIAL HEPATECTOMY [83] - DX LAPAROSCOPY, INTRAOPERATIVE LIVER ULTRASOUND, OPEN PARTIAL HEPATECTOMY  Patient Location: PACU  Anesthesia Type:General  Level of Consciousness: sedated, patient cooperative and responds to stimulation  Airway & Oxygen Therapy: Patient Spontanous Breathing and Patient connected to face mask oxygen  Post-op Assessment: Report given to PACU RN, Post -op Vital signs reviewed and stable and Patient moving all extremities  Post vital signs: Reviewed and unstable  Complications: No apparent anesthesia complications

## 2013-03-02 NOTE — Transfer of Care (Signed)
Immediate Anesthesia Transfer of Care Note  Patient: [name]  Procedure(s) Performed: Procedure(s) with comments: LAPAROSCOPY DIAGNOSTIC LIVER ULTRASOUND OPEN PARTIAL HEPATECTOMY [83] - DX LAPAROSCOPY, INTRAOPERATIVE LIVER ULTRASOUND, OPEN PARTIAL HEPATECTOMY  Patient Location: PACU  Anesthesia Type:General  Level of Consciousness: awake  Airway & Oxygen Therapy: Patient Spontanous Breathing and Patient connected to face mask oxygen  Post-op Assessment: Report given to PACU RN, Post -op Vital signs reviewed and stable and Patient moving all extremities  Post vital signs: Reviewed and stable  Complications: No apparent anesthesia complications

## 2013-03-02 NOTE — Progress Notes (Signed)
Call to doctor [place] for CBG order received

## 2013-03-02 NOTE — Interval H&P Note (Signed)
History and Physical Interval Note:  03/02/2013 8:41 AM  [name]  has presented today for surgery, with the diagnosis of hepatocellular cancer  The various methods of treatment have been discussed with the patient and family. After consideration of risks, benefits and other options for treatment, the patient has consented to  Procedure(s): LAPAROSCOPIC PARTIAL HEPATECTOMY (N/A) as a surgical intervention .  The patient's history has been reviewed, patient examined, no change in status, stable for surgery.  I have reviewed the patient's chart and labs.  Questions were answered to the patient's satisfaction.     [name]

## 2013-03-02 NOTE — H&P (View-Only) (Signed)
HISTORY: Pt here to discuss surgery for Monday.  We have planned a left lateral segmentectomy. 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  He has been doing well at home.  His appetite is OK.     PERTINENT REVIEW OF SYSTEMS: Otherwise negative.    Filed Vitals:   02/24/13 1543  BP: 120/70  Pulse: 68  Temp: 98 F (36.7 C)  Resp: 16   Filed Weights   02/24/13 1543  Weight: 214 lb (97.07 kg)   EXAM: Head: Normocephalic and atraumatic.  Eyes:  Conjunctivae are normal. Pupils are equal, round, and reactive to light. No scleral icterus.  Resp: No respiratory distress, normal effort. Abd:  Abdomen is soft, non distended and non tender. No masses are palpable.  There is no rebound and no guarding.  Neurological: Alert and oriented to person, place, and time. Coordination normal.  Skin: Skin is warm and dry. No rash noted. No diaphoretic. No erythema. No pallor.  Psychiatric: Normal mood and affect. Normal behavior. Judgment and thought content normal.      ASSESSMENT AND PLAN:   Cancer, hepatocellular Plan left lateral segmentectomy. Will attempt this laparoscopically.  The patient was diagnosed because of tumor rupture. I've advised the patient that if I see horrible cirrhosis or peritoneal carcinomatosis, we will abort the procedure.  I reviewed the major risks of the procedure including bleeding, infection, damage to adjacent structures, blood clot, pneumonia, bile leak, swelling/edema, liver failure.   I advised the patient to bring his CPAP machine to the hospital. I will order a hepatitis B RNA level.  I will request a GI referral for hepatitis.    30 min spent in counseling.        Maudry Diego, MD Surgical Oncology, General & Endocrine Surgery [facility]  Shade Flood, MD Shade Flood, MD

## 2013-03-02 NOTE — Anesthesia Preprocedure Evaluation (Addendum)
Anesthesia Evaluation  Patient identified by MRN, date of birth, ID band Patient awake    Reviewed: Allergy & Precautions, H&P , NPO status , Patient's Chart, lab work & pertinent test results  Airway Mallampati: III TM Distance: >3 FB Neck ROM: Full    Dental  (+) Poor Dentition, Caps, Dental Advisory Given, Missing and Loose,    Pulmonary sleep apnea , former smoker,  breath sounds clear to auscultation  Pulmonary exam normal       Cardiovascular hypertension, Rhythm:Regular Rate:Normal     Neuro/Psych Narcolepsy negative psych ROS   GI/Hepatic negative GI ROS, (+) Hepatitis -Hepatocellular cancer   Endo/Other  diabetes, Poorly Controlled, Type 2, Oral Hypoglycemic Agents  Renal/GU negative Renal ROS  Male GU complaint  negative genitourinary   Musculoskeletal negative musculoskeletal ROS (+)   Abdominal (+) + obese,   Peds negative pediatric ROS (+)  Hematology negative hematology ROS (+) Blood dyscrasia, anemia ,   Anesthesia Other Findings Patient states he discontinued his oral diabetes medication one week ago per surgeons orders. Dr. Donell Beers made aware.  Reproductive/Obstetrics                          Anesthesia Physical Anesthesia Plan  ASA: III  Anesthesia Plan: General   Post-op Pain Management:    Induction: Intravenous  Airway Management Planned: Oral ETT  Additional Equipment: Arterial line  Intra-op Plan:   Post-operative Plan: Extubation in OR  Informed Consent: I have reviewed the patients History and Physical, chart, labs and discussed the procedure including the risks, benefits and alternatives for the proposed anesthesia with the patient or authorized representative who has indicated his/her understanding and acceptance.   Dental advisory given  Plan Discussed with: CRNA  Anesthesia Plan Comments:         Anesthesia Quick Evaluation

## 2013-03-03 ENCOUNTER — Encounter (INDEPENDENT_AMBULATORY_CARE_PROVIDER_SITE_OTHER): Payer: Medicare Other | Admitting: General Surgery

## 2013-03-03 ENCOUNTER — Encounter (HOSPITAL_COMMUNITY): Payer: Self-pay | Admitting: General Surgery

## 2013-03-03 ENCOUNTER — Inpatient Hospital Stay (HOSPITAL_COMMUNITY): Payer: Medicare Other

## 2013-03-03 DIAGNOSIS — R030 Elevated blood-pressure reading, without diagnosis of hypertension: Secondary | ICD-10-CM

## 2013-03-03 DIAGNOSIS — G4733 Obstructive sleep apnea (adult) (pediatric): Secondary | ICD-10-CM

## 2013-03-03 LAB — GLUCOSE, CAPILLARY
Glucose-Capillary: 124 mg/dL — ABNORMAL HIGH (ref 70–99)
Glucose-Capillary: 131 mg/dL — ABNORMAL HIGH (ref 70–99)
Glucose-Capillary: 139 mg/dL — ABNORMAL HIGH (ref 70–99)
Glucose-Capillary: 156 mg/dL — ABNORMAL HIGH (ref 70–99)
Glucose-Capillary: 163 mg/dL — ABNORMAL HIGH (ref 70–99)
Glucose-Capillary: 170 mg/dL — ABNORMAL HIGH (ref 70–99)
Glucose-Capillary: 183 mg/dL — ABNORMAL HIGH (ref 70–99)
Glucose-Capillary: 190 mg/dL — ABNORMAL HIGH (ref 70–99)
Glucose-Capillary: 209 mg/dL — ABNORMAL HIGH (ref 70–99)
Glucose-Capillary: 214 mg/dL — ABNORMAL HIGH (ref 70–99)
Glucose-Capillary: 252 mg/dL — ABNORMAL HIGH (ref 70–99)
Glucose-Capillary: 253 mg/dL — ABNORMAL HIGH (ref 70–99)
Glucose-Capillary: 269 mg/dL — ABNORMAL HIGH (ref 70–99)
Glucose-Capillary: 270 mg/dL — ABNORMAL HIGH (ref 70–99)
Glucose-Capillary: 342 mg/dL — ABNORMAL HIGH (ref 70–99)

## 2013-03-03 LAB — MAGNESIUM
Magnesium: 1.5 mg/dL (ref 1.5–2.5)
Magnesium: 2.1 mg/dL (ref 1.5–2.5)

## 2013-03-03 LAB — POCT I-STAT 4, (NA,K, GLUC, HGB,HCT)
Glucose, Bld: 290 mg/dL — ABNORMAL HIGH (ref 70–99)
HCT: 34 % — ABNORMAL LOW (ref 39.0–52.0)
Hemoglobin: 11.6 g/dL — ABNORMAL LOW (ref 13.0–17.0)
Potassium: 3.8 mEq/L (ref 3.5–5.1)
Sodium: 138 mEq/L (ref 135–145)

## 2013-03-03 LAB — COMPREHENSIVE METABOLIC PANEL
ALT: 214 U/L — ABNORMAL HIGH (ref 0–53)
AST: 266 U/L — ABNORMAL HIGH (ref 0–37)
Albumin: 2.8 g/dL — ABNORMAL LOW (ref 3.5–5.2)
Alkaline Phosphatase: 71 U/L (ref 39–117)
BUN: 12 mg/dL (ref 6–23)
CO2: 24 mEq/L (ref 19–32)
Calcium: 7.9 mg/dL — ABNORMAL LOW (ref 8.4–10.5)
Chloride: 103 mEq/L (ref 96–112)
Creatinine, Ser: 0.91 mg/dL (ref 0.50–1.35)
GFR calc Af Amer: >90 mL/min (ref >90)
GFR calc non Af Amer: 85 mL/min — ABNORMAL LOW (ref >90)
Glucose, Bld: 138 mg/dL — ABNORMAL HIGH (ref 70–99)
Potassium: 3.9 mEq/L (ref 3.5–5.1)
Sodium: 135 mEq/L (ref 135–145)
Total Bilirubin: 0.7 mg/dL (ref 0.3–1.2)
Total Protein: 6.3 g/dL (ref 6.0–8.3)

## 2013-03-03 LAB — CBC
HCT: 31.3 % — ABNORMAL LOW (ref 39.0–52.0)
Hemoglobin: 10.2 g/dL — ABNORMAL LOW (ref 13.0–17.0)
MCH: 28.2 pg (ref 26.0–34.0)
MCHC: 32.6 g/dL (ref 30.0–36.0)
MCV: 86.5 fL (ref 78.0–100.0)
Platelets: 153 10*3/uL (ref 150–400)
RBC: 3.62 MIL/uL — ABNORMAL LOW (ref 4.22–5.81)
RDW: 14.3 % (ref 11.5–15.5)
WBC: 11.4 10*3/uL — ABNORMAL HIGH (ref 4.0–10.5)

## 2013-03-03 LAB — PROTIME-INR
INR: 1.27 (ref 0.00–1.49)
Prothrombin Time: 15.6 seconds — ABNORMAL HIGH (ref 11.6–15.2)

## 2013-03-03 LAB — PHOSPHORUS: Phosphorus: 4.5 mg/dL (ref 2.3–4.6)

## 2013-03-03 LAB — APTT: aPTT: 26 seconds (ref 24–37)

## 2013-03-03 MED ORDER — ALBUMIN HUMAN 25 % IV SOLN
25.0000 g | Freq: Three times a day (TID) | INTRAVENOUS | Status: AC
  Administered 2013-03-03 – 2013-03-05 (×6): 25 g via INTRAVENOUS
  Filled 2013-03-03 (×7): qty 100

## 2013-03-03 MED ORDER — MAGNESIUM SULFATE 40 MG/ML IJ SOLN
2.0000 g | Freq: Once | INTRAMUSCULAR | Status: AC
  Administered 2013-03-03: 2 g via INTRAVENOUS
  Filled 2013-03-03: qty 50

## 2013-03-03 MED ORDER — INSULIN ASPART 100 UNIT/ML ~~LOC~~ SOLN
0.0000 [IU] | SUBCUTANEOUS | Status: DC
  Administered 2013-03-03: 3 [IU] via SUBCUTANEOUS
  Administered 2013-03-03: 5 [IU] via SUBCUTANEOUS
  Administered 2013-03-03 (×2): 3 [IU] via SUBCUTANEOUS
  Administered 2013-03-03: 2 [IU] via SUBCUTANEOUS
  Administered 2013-03-04: 5 [IU] via SUBCUTANEOUS
  Administered 2013-03-04 (×5): 3 [IU] via SUBCUTANEOUS
  Administered 2013-03-05: 5 [IU] via SUBCUTANEOUS
  Administered 2013-03-05 (×3): 3 [IU] via SUBCUTANEOUS
  Administered 2013-03-05: 5 [IU] via SUBCUTANEOUS
  Administered 2013-03-05: 3 [IU] via SUBCUTANEOUS
  Administered 2013-03-06 (×2): 5 [IU] via SUBCUTANEOUS
  Administered 2013-03-06: 3 [IU] via SUBCUTANEOUS
  Administered 2013-03-06: 2 [IU] via SUBCUTANEOUS

## 2013-03-03 NOTE — Progress Notes (Signed)
1 Day Post-Op  Subjective: C/o a little soreness.  Denies nausea.  Was sleepy last night.  More alert today.    Objective: Vital signs in last 24 hours: Temp:  [97.4 F (36.3 C)-97.8 F (36.6 C)] 97.8 F (36.6 C) (07/15 0400) Pulse Rate:  [80-109] 86 (07/15 0800) Resp:  [7-20] 15 (07/15 0800) BP: (84-146)/(50-95) 120/82 mmHg (07/15 0800) SpO2:  [34 %-100 %] 100 % (07/15 0800) Arterial Line BP: (71-137)/(45-125) 137/125 mmHg (07/15 0800) Weight:  [220 lb 7.4 oz (100 kg)] 220 lb 7.4 oz (100 kg) (07/14 1720)    Intake/Output from previous day: 07/14 0701 - 07/15 0700 In: 6583.3 [I.V.:5083.3; Blood:1050; IV Piggyback:450] Out: 2345 [Urine:1250; Drains:145; Blood:950] Intake/Output this shift: Total I/O In: 15 [I.V.:15] Out: -   General appearance: alert, cooperative and no distress Resp: breathing comfortably Cardio: regular rate and rhythm GI: soft, sl distended, but looks baseline, dressings OK.  Jp serosang Extremities: extremities normal, atraumatic, no cyanosis or edema  Lab Results:   Recent Labs  03/02/13 1235 03/03/13 0430  WBC  --  11.4*  HGB 11.6* 10.2*  HCT 34.0* 31.3*  PLT  --  153   BMET  Recent Labs  03/02/13 1235 03/03/13 0430  NA 138 135  K 3.8 3.9  CL  --  103  CO2  --  24  GLUCOSE 290* 138*  BUN  --  12  CREATININE  --  0.91  CALCIUM  --  7.9*   PT/INR  Recent Labs  03/03/13 0430  LABPROT 15.6*  INR 1.27   ABG No results found for this basename: PHART, PCO2, PO2, HCO3,  in the last 72 hours  Studies/Results: Dg Chest Port 1 View  03/03/2013   *RADIOLOGY REPORT*  Clinical Data: Respiratory failure.  PORTABLE CHEST - 1 VIEW  Comparison: Chest x-ray 01/15/2013.  Findings: Lung volumes are low.  There are linear opacities throughout the mid to lower lungs bilaterally, favored to represent areas of subsegmental atelectasis or scarring, however, developing air space consolidation is difficult to entirely exclude.  No definite pleural  effusions.  Mild pulmonary venous congestion, without frank pulmonary edema.  Heart size appears upper limits of normal.  Upper mediastinal contours have white and compared to the prior examination.  Atherosclerosis in the thoracic aorta.  IMPRESSION: 1.  Pulmonary venous congestion, without frank pulmonary edema. 2.  Linear opacities throughout the mid to lower lungs bilaterally, favored to predominately reflect atelectasis or scarring, however, developing air space consolidation is difficult to exclude. 3.  Interval development of mediastinal widening.  Although this may be accentuated by low lung volumes and slight kyphotic positioning, if there is clinical concern for mediastinal lymphadenopathy, this could be better evaluated with a standing PA and lateral chest radiograph, or follow-up chest CT with contrast. 4.  Atherosclerosis.   Original Report Authenticated By: Trudie Reed, M.D.    Anti-infectives: Anti-infectives   Start     Dose/Rate Route Frequency Ordered Stop   03/02/13 1700  cefOXitin (MEFOXIN) 1 g in dextrose 5 % 50 mL IVPB     1 g 100 mL/hr over 30 Minutes Intravenous Every 6 hours 03/02/13 1617 03/03/13 0527   03/02/13 0636  cefOXitin (MEFOXIN) 2 g in dextrose 5 % 50 mL IVPB     2 g 100 mL/hr over 30 Minutes Intravenous On call to O.R. 03/02/13 0636 03/02/13 0917      Assessment/Plan: s/p Procedure(s) with comments: LAPAROSCOPY DIAGNOSTIC LIVER ULTRASOUND OPEN PARTIAL HEPATECTOMY [83] - DX  LAPAROSCOPY, INTRAOPERATIVE LIVER ULTRASOUND, OPEN PARTIAL HEPATECTOMY Advance diet Continue foley due to strict I&O, patient in ICU and urinary output monitoring replete magnesium Hypotension recorded was from art line which is dampened.   LOS: 1 day    [facility] 03/03/2013

## 2013-03-03 NOTE — Progress Notes (Signed)
[facility] ADULT ICU REPLACEMENT PROTOCOL FOR AM LAB REPLACEMENT ONLY  The patient does apply for the [facility] Adult ICU Electrolyte Replacment Protocol based on the criteria listed below:   1. Is GFR >/= 40 ml/min? yes  Patient's GFR today is 85 2. Is urine output >/= 0.5 ml/kg/hr for the last 6 hours? yes Patient's UOP is 0.59 ml/kg/hr 3. Is BUN < 60 mg/dL? yes  Patient's BUN today is 12 4. Abnormal electrolyte(s):Mg 1.5 5. Ordered repletion with: per protocol 6. If a panic level lab has been reported, has the CCM MD in charge been notified? yes.   Physician:  Emi Holes, Vidhi Delellis A 03/03/2013 5:26 AM

## 2013-03-03 NOTE — Progress Notes (Signed)
Inpatient Diabetes Program Recommendations  AACE/ADA: New Consensus Statement on Inpatient Glycemic Control (2013)  Target Ranges:  Prepandial:   less than 140 mg/dL      Peak postprandial:   less than 180 mg/dL (1-2 hours)      Critically ill patients:  140 - 180 mg/dL   Reason for Visit: Hyperglycemia  Results for SCHON, ZEIDERS (MRN [id number]) as of 03/03/2013 10:10  Ref. Range 03/02/2013 19:35 03/02/2013 20:49 03/02/2013 21:38 03/02/2013 22:44 03/02/2013 23:56 03/03/2013 00:49 03/03/2013 01:51 03/03/2013 02:44 03/03/2013 04:03 03/03/2013 04:52 03/03/2013 07:40  Glucose-Capillary Latest Range: 70-99 mg/dL 782 (H) 956 (H) 213 (H) 270 (H) 214 (H) 170 (H) 156 (H) 124 (H) 131 (H) 139 (H) 163 (H)    Inpatient Diabetes Program Recommendations HgbA1C: Need updated HgbA1C to assess glycemic control prior to hospitalization - Last one 09/08/2012 - 9.5% Diet: When advanced, CHO mod med  Note: Will follow.  Thank you. Ailene Ards, RD, LDN, CDE Inpatient Diabetes Coordinator [phone]

## 2013-03-03 NOTE — Progress Notes (Signed)
CARE MANAGEMENT NOTE 03/03/2013  Patient:  [name], [name]   Account Number:  [id number]  Date Initiated:  03/03/2013  Documentation initiated by:  Kalah Pflum  Subjective/Objective Assessment:   surgical patient with post complication of prolonged anesthesia recovery.  low bp required iv volume support, iv insulin drip post op,     Action/Plan:   home when stable   Anticipated DC Date:  03/06/2013   Anticipated DC Plan:  HOME/SELF CARE  In-house referral  NA      DC Planning Services  NA      PAC Choice  NA   Choice offered to / List presented to:  NA   DME arranged  NA      DME agency  NA     HH arranged  NA      HH agency  NA   Status of service:  In process, will continue to follow Medicare Important Message given?  NA - LOS <3 / Initial given by admissions (If response is "NO", the following Medicare IM given date fields will be blank) Date Medicare IM given:   Date Additional Medicare IM given:    Discharge Disposition:    Per UR Regulation:  Reviewed for med. necessity/level of care/duration of stay  If discussed at Long Length of Stay Meetings, dates discussed:    Comments:  [id number]/[id number] Earlene Plater, RN, BSN, CCM:  CHART REVIEWED AND UPDATED.  Next chart review due on [id number]. NO DISCHARGE NEEDS PRESENT AT THIS TIME. CASE MANAGEMENT [id number]

## 2013-03-03 NOTE — Progress Notes (Signed)
eLink Physician-Brief Progress Note Patient Name: [name] DOB: 12/25/1945 MRN: [id number]  Date of Service  03/03/2013   HPI/Events of Note  Sugars under control per RN   eICU Interventions  Change ICU protocol to non pregnant ssi   Intervention Category Minor Interventions: Other:  Robbin Escher 03/03/2013, 4:24 AM

## 2013-03-03 NOTE — Progress Notes (Signed)
PULMONARY  / CRITICAL CARE MEDICINE  Name: [name] MRN: [id number] DOB: 06-28-1946    ADMISSION DATE:  03/02/2013 CONSULTATION DATE:  7/14  REFERRING MD :  Donell Beers PRIMARY SERVICE: CCS  CHIEF COMPLAINT:  S/P resection of hepatocellular carcinoma  BRIEF PATIENT DESCRIPTION: 64 M admitted to ICU after elective resection of hepatocellular carcinoma with post op somnolence/narcosis and compromised resp status. PCCM asked to assist in monitoring and mgmt. PMH of OSA, narcolepsy, DMII, former smoker. 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  VITAL SIGNS: Temp:  [97.4 F (36.3 C)-97.8 F (36.6 C)] 97.8 F (36.6 C) (07/15 0400) Pulse Rate:  [80-109] 86 (07/15 0800) Resp:  [7-20] 15 (07/15 0800) BP: (84-146)/(50-95) 120/82 mmHg (07/15 0800) SpO2:  [34 %-100 %] 100 % (07/15 0800) Arterial Line BP: (71-137)/(45-125) 137/125 mmHg (07/15 0800) Weight:  [100 kg (220 lb 7.4 oz)] 100 kg (220 lb 7.4 oz) (07/14 1720) HEMODYNAMICS:   VENTILATOR SETTINGS:   INTAKE / OUTPUT: Intake/Output     07/14 0701 - 07/15 0700 07/15 0701 - 07/16 0700   I.V. (mL/kg) 5083.3 (50.8) 15 (0.2)   Blood 1050    IV Piggyback 450    Total Intake(mL/kg) 6583.3 (65.8) 15 (0.2)   Urine (mL/kg/hr) 1250 (0.5)    Drains 145 (0.1)    Blood 950 (0.4)    Total Output 2345     Net +4238.3 +15         PHYSICAL EXAMINATION: General:  WDWN, somnolent, snoring respirations but easily arousable to name. Neuro: RASS -1, MAEs, not F/C. HEENT: Pupils narrow but reactive, o/w WNL. Cardiovascular: RRR s M Lungs: clear anteriorly without wheezes Abdomen: surgical dressings noted, mildly diffusely tender Ext: warm, without edema  LABS:  Recent  Labs Lab 02/25/13 0825 03/02/13 1235 03/03/13 0430  HGB 9.9* 11.6* 10.2*  WBC 3.5*  --  11.4*  PLT 215  --  153  NA 136 138 135  K 4.0 3.8 3.9  CL 101  --  103  CO2 25  --  24  GLUCOSE 203* 290* 138*  BUN 12  --  12  CREATININE 1.03  --  0.91  CALCIUM 9.2  --  7.9*  MG  --   --  1.5  PHOS  --   --  4.5  AST 45*  --  266*  ALT 40  --  214*  ALKPHOS 72  --  71  BILITOT 0.4  --  0.7  PROT 8.2  --  6.3  ALBUMIN 3.6  --  2.8*  APTT 29  --  26  INR 1.09  --  1.27   Recent Labs Lab 03/03/13 0151 03/03/13 0244 03/03/13 0403 03/03/13 0452 03/03/13 0740  GLUCAP 156* 124* 131* 139* 163*   CXR:   ASSESSMENT / PLAN:  PULMONARY A: Post op respiratory compromise due to narcosis/ prolonged anesthesia Former smoker H/O OSA - unclear if treated P:   - Supplemental O2 as needed. - Monitor closely with oximetry and capnography while on PCA, no basal rate at this time only with patient's demand. - Empiric BDs. - IS and flutter valve. - Would hold in SDU for monitoring overnight.  CARDIOVASCULAR A: H/O Htn  P:  - Cont norvasc for  BP but d/c HCTZ 12.5 daily and spironolactone 25 PO BID until PO intake is more stable.[name] Kitchen  RENAL A:  No issues P:   - Monitor BMET intermittently - Correct electrolytes as indicated - HCTZ and spironolactone held.  GASTROINTESTINAL A:  P lap, partial L hepatic lobectomy P:   - Post op mgmt per CCS. - Nutrition per CCS.  HEMATOLOGIC A:  No issues P:  - Monitor CBC intermittently.  INFECTIOUS A:  No issues, had peri-op abx but not continued. P:   - Monitor.  ENDOCRINE A:  DMII Post op hyperglycemia P:   - Changed to regular non-preg from insulin drip since CBGs are now more stable.  NEUROLOGIC A: Post op narcosis - opioids/prolonged anesthesia H/O narcolepsy Post op pain P:   - Cont Adderal. - PCA opioids. - Will need to be assessed for OSA as outpatient.  TODAY'S SUMMARY:   I have personally obtained a history, examined  the patient, evaluated laboratory and imaging results, formulated the assessment and plan and placed orders.  [name], M.D. [facility] Pulmonary/Critical Care Medicine. Pager: [phone]. After hours pager: [phone].

## 2013-03-04 LAB — COMPREHENSIVE METABOLIC PANEL
ALT: 672 U/L — ABNORMAL HIGH (ref 0–53)
AST: 543 U/L — ABNORMAL HIGH (ref 0–37)
Albumin: 3.8 g/dL (ref 3.5–5.2)
Alkaline Phosphatase: 127 U/L — ABNORMAL HIGH (ref 39–117)
BUN: 14 mg/dL (ref 6–23)
CO2: 25 mEq/L (ref 19–32)
Calcium: 8.5 mg/dL (ref 8.4–10.5)
Chloride: 100 mEq/L (ref 96–112)
Creatinine, Ser: 0.88 mg/dL (ref 0.50–1.35)
GFR calc Af Amer: >90 mL/min (ref >90)
GFR calc non Af Amer: 86 mL/min — ABNORMAL LOW (ref >90)
Glucose, Bld: 209 mg/dL — ABNORMAL HIGH (ref 70–99)
Potassium: 4.2 mEq/L (ref 3.5–5.1)
Sodium: 134 mEq/L — ABNORMAL LOW (ref 135–145)
Total Bilirubin: 1 mg/dL (ref 0.3–1.2)
Total Protein: 7.2 g/dL (ref 6.0–8.3)

## 2013-03-04 LAB — CBC
HCT: 29.1 % — ABNORMAL LOW (ref 39.0–52.0)
Hemoglobin: 9.5 g/dL — ABNORMAL LOW (ref 13.0–17.0)
MCH: 28.5 pg (ref 26.0–34.0)
MCHC: 32.6 g/dL (ref 30.0–36.0)
MCV: 87.4 fL (ref 78.0–100.0)
Platelets: 136 10*3/uL — ABNORMAL LOW (ref 150–400)
RBC: 3.33 MIL/uL — ABNORMAL LOW (ref 4.22–5.81)
RDW: 15 % (ref 11.5–15.5)
WBC: 10.3 10*3/uL (ref 4.0–10.5)

## 2013-03-04 LAB — GLUCOSE, CAPILLARY
Glucose-Capillary: 219 mg/dL — ABNORMAL HIGH (ref 70–99)
Glucose-Capillary: 195 mg/dL — ABNORMAL HIGH (ref 70–99)
Glucose-Capillary: 192 mg/dL — ABNORMAL HIGH (ref 70–99)
Glucose-Capillary: 189 mg/dL — ABNORMAL HIGH (ref 70–99)
Glucose-Capillary: 182 mg/dL — ABNORMAL HIGH (ref 70–99)
Glucose-Capillary: 170 mg/dL — ABNORMAL HIGH (ref 70–99)

## 2013-03-04 LAB — MAGNESIUM: Magnesium: 2.1 mg/dL (ref 1.5–2.5)

## 2013-03-04 LAB — PHOSPHORUS: Phosphorus: 3.1 mg/dL (ref 2.3–4.6)

## 2013-03-04 MED ORDER — HALOPERIDOL LACTATE 5 MG/ML IJ SOLN
5.0000 mg | INTRAMUSCULAR | Status: AC
  Administered 2013-03-04: 5 mg via INTRAVENOUS

## 2013-03-04 MED ORDER — MIDAZOLAM HCL 2 MG/2ML IJ SOLN: 4.0000 mg | INTRAMUSCULAR | Status: AC

## 2013-03-04 MED ORDER — HALOPERIDOL LACTATE 5 MG/ML IJ SOLN
INTRAMUSCULAR | Status: AC
  Filled 2013-03-04: qty 1

## 2013-03-04 MED ORDER — MIDAZOLAM HCL 2 MG/2ML IJ SOLN: 1.0000 mg | INTRAMUSCULAR | Status: DC | PRN

## 2013-03-04 MED ORDER — FENTANYL CITRATE 0.05 MG/ML IJ SOLN
INTRAMUSCULAR | Status: AC
  Filled 2013-03-04: qty 2

## 2013-03-04 MED ORDER — HALOPERIDOL LACTATE 5 MG/ML IJ SOLN
5.0000 mg | Freq: Once | INTRAMUSCULAR | Status: DC
  Filled 2013-03-04: qty 1

## 2013-03-04 MED ORDER — DEXMEDETOMIDINE HCL IN NACL 200 MCG/50ML IV SOLN: 0.2000 ug/kg/h | INTRAVENOUS | Status: DC

## 2013-03-04 MED ORDER — HYDROCHLOROTHIAZIDE 12.5 MG PO CAPS
12.5000 mg | ORAL_CAPSULE | Freq: Every day | ORAL | Status: DC
  Administered 2013-03-04 – 2013-03-09 (×6): 12.5 mg via ORAL
  Filled 2013-03-04 (×6): qty 1

## 2013-03-04 MED ORDER — FENTANYL CITRATE 0.05 MG/ML IJ SOLN
50.0000 ug | INTRAMUSCULAR | Status: AC
  Administered 2013-03-04: 50 ug via INTRAVENOUS

## 2013-03-04 MED ORDER — LOSARTAN POTASSIUM 25 MG PO TABS
25.0000 mg | ORAL_TABLET | Freq: Every morning | ORAL | Status: DC
  Administered 2013-03-04 – 2013-03-09 (×6): 25 mg via ORAL
  Filled 2013-03-04 (×6): qty 1

## 2013-03-04 MED ORDER — MIDAZOLAM HCL 2 MG/2ML IJ SOLN
INTRAMUSCULAR | Status: AC
  Filled 2013-03-04: qty 4

## 2013-03-04 MED ORDER — DEXMEDETOMIDINE HCL IN NACL 400 MCG/100ML IV SOLN
0.2000 ug/kg/h | INTRAVENOUS | Status: DC
  Administered 2013-03-04: 0.2 ug/kg/h via INTRAVENOUS
  Filled 2013-03-04 (×2): qty 100

## 2013-03-04 MED ORDER — HALOPERIDOL LACTATE 5 MG/ML IJ SOLN
3.0000 mg | Freq: Four times a day (QID) | INTRAMUSCULAR | Status: DC
  Administered 2013-03-05 – 2013-03-09 (×6): 3 mg via INTRAVENOUS
  Filled 2013-03-04 (×3): qty 0.6
  Filled 2013-03-04: qty 1
  Filled 2013-03-04: qty 0.6
  Filled 2013-03-04: qty 1
  Filled 2013-03-04 (×5): qty 0.6
  Filled 2013-03-04: qty 1
  Filled 2013-03-04 (×5): qty 0.6
  Filled 2013-03-04: qty 1
  Filled 2013-03-04 (×6): qty 0.6

## 2013-03-04 MED ORDER — SPIRONOLACTONE 25 MG PO TABS
25.0000 mg | ORAL_TABLET | Freq: Every day | ORAL | Status: DC
  Administered 2013-03-04 – 2013-03-05 (×2): 25 mg via ORAL
  Filled 2013-03-04 (×3): qty 1

## 2013-03-04 NOTE — Progress Notes (Addendum)
PULMONARY  / CRITICAL CARE MEDICINE  Name: [name] MRN: [id number] DOB: [birth year]    ADMISSION DATE:  03/02/2013 CONSULTATION DATE:  7/14  REFERRING MD :  Donell Beers PRIMARY SERVICE: CCS  CHIEF COMPLAINT:  S/P resection of hepatocellular carcinoma  BRIEF PATIENT DESCRIPTION: 74 M admitted to ICU after elective resection of hepatocellular carcinoma with post op somnolence/narcosis and compromised resp status. PCCM asked to assist in monitoring and mgmt. PMH of OSA, narcolepsy, DMII, former smoker. 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  VITAL SIGNS: Temp:  [97.5 F (36.4 C)-98.7 F (37.1 C)] 98.7 F (37.1 C) (07/16 0400) Pulse Rate:  [90-110] 104 (07/16 0600) Resp:  [10-36] 34 (07/16 0600) BP: (103-155)/(44-81) 155/73 mmHg (07/16 0600) SpO2:  [88 %-97 %] 93 % (07/16 0750) Arterial Line BP: (113-171)/(68-127) 137/127 mmHg (07/16 0500) HEMODYNAMICS:   VENTILATOR SETTINGS:   INTAKE / OUTPUT: Intake/Output     07/15 0701 - 07/16 0700 07/16 0701 - 07/17 0700   P.O. 480    I.V. (mL/kg) 55.2 (0.6)    Blood     IV Piggyback 250    Total Intake(mL/kg) 785.2 (7.9)    Urine (mL/kg/hr) 900 (0.4)    Drains 130 (0.1)    Blood     Total Output 1030     Net -244.9           PHYSICAL EXAMINATION: General:  WDWN, somnolent, snoring respirations but easily arousable to name. Neuro: RASS -1, MAEs, not F/C. HEENT: Pupils narrow but reactive, o/w WNL. Cardiovascular: RRR s M Lungs: clear anteriorly without wheezes Abdomen: surgical dressings noted, mildly diffusely tender Ext: warm, without edema  LABS:  Recent Labs Lab 03/02/13 1235 03/03/13 0430 03/03/13 2045 03/04/13 0437  HGB 11.6* 10.2*  --  9.5*   WBC  --  11.4*  --  10.3  PLT  --  153  --  136*  NA 138 135  --  134*  K 3.8 3.9  --  4.2  CL  --  103  --  100  CO2  --  24  --  25  GLUCOSE 290* 138*  --  209*  BUN  --  12  --  14  CREATININE  --  0.91  --  0.88  CALCIUM  --  7.9*  --  8.5  MG  --  1.5 2.1 2.1  PHOS  --  4.5  --  3.1  AST  --  266*  --  543*  ALT  --  214*  --  672*  ALKPHOS  --  71  --  127*  BILITOT  --  0.7  --  1.0  PROT  --  6.3  --  7.2  ALBUMIN  --  2.8*  --  3.8  APTT  --  26  --   --   INR  --  1.27  --   --     Recent Labs Lab 03/03/13 1206 03/03/13 1525 03/03/13 2002 03/04/13 0050 03/04/13 0333  GLUCAP 183* 190* 209* 219* 195*   CXR:   ASSESSMENT / PLAN:  PULMONARY A: Post op respiratory compromise due to narcosis/ prolonged anesthesia Former smoker H/O OSA - unclear if treated P:   - Supplemental  O2 as needed. - Empiric BDs. - IS and flutter valve. - Ok to transfer to [facility] whenever ok with surgery.  CARDIOVASCULAR A: H/O Htn  P:  - Cont norvasc for BP. - Will restart HCTZ 12.5 mg daily and spironolactone 25 mg PO QD (on BID at home) now that is taking PO.  RENAL A:  No issues P:   - Monitor BMET intermittently. - Correct electrolytes as indicated. - Restart HCTZ and spironolactone.  GASTROINTESTINAL A:  P lap, partial L hepatic lobectomy P:   - Post op mgmt per CCS. - Nutrition per CCS, clear liquid and pills ok for now.  HEMATOLOGIC A:  No issues P:  - Monitor CBC intermittently.  INFECTIOUS A:  No issues, had peri-op abx but not continued. P:   - Monitor.  ENDOCRINE A:  DMII Post op hyperglycemia P:   - Changed to regular non-preg from insulin drip since CBGs are now more stable. - Once PO intake is back to normal will need to restart Metformin 1000 mg PO BID.  NEUROLOGIC A: Post op narcosis - opioids/prolonged anesthesia H/O narcolepsy Post op pain P:   - Cont Adderal. - PCA opioids, would attempt to D/C ASAP. - Will need to be assessed  for OSA as outpatient.  TODAY'S SUMMARY: Up in a chair most of yesterday, improving, will restart his other anti-HTN, will need metformin back once eating normally and will need sleep study as outpatient.  I have personally obtained a history, examined the patient, evaluated laboratory and imaging results, formulated the assessment and plan and placed orders.  [name], M.D. [facility] Pulmonary/Critical Care Medicine. Pager: [phone]. After hours pager: [phone].

## 2013-03-04 NOTE — Anesthesia Postprocedure Evaluation (Signed)
Anesthesia Post Note  Patient: [name]  Procedure(s) Performed: Procedure(s): LAPAROSCOPY DIAGNOSTIC LIVER ULTRASOUND OPEN PARTIAL HEPATECTOMY [83]  Anesthesia type: General  Patient location: PACU  Post pain: Pain level controlled  Post assessment: Post-op Vital signs reviewed  Last Vitals:  Filed Vitals:   03/04/13 0600  BP: 155/73  Pulse: 104  Temp:   Resp: 34    Post vital signs: Reviewed  Level of consciousness: sedated  Complications: No apparent anesthesia complications

## 2013-03-04 NOTE — Progress Notes (Addendum)
eLink Physician-Brief Progress Note Patient Name: [name] DOB: April 04, 1946 MRN: [id number]  Date of Service  03/04/2013   HPI/Events of Note   RASS +4. Police and secirty with hm. He is sitting on chair and very confused  eICU Interventions  Check stat glucose -> 300s mg Versed 4mg  IV x ` Haldol 5mg  IV x 1 Fentanyl IV  x1 (On PCA pump but per preort hardly used it) Reassess in 20 min from  11:20 PM 03/04/2013     Intervention Category Minor Interventions: Other: Lynnae January 03/04/2013, 11:20 PM

## 2013-03-04 NOTE — Progress Notes (Signed)
Patient ID: KENDALE REMBOLD, male   DOB: December 07, 1945, 67 y.o.   MRN: [id number] 2 Days Post-Op  Subjective: C/o a little pain when he coughs.  Slightly sleepy, using PCA.  Was out of bed yesterday most of day.      Objective: Vital signs in last 24 hours: Temp:  [97.5 F (36.4 C)-98.7 F (37.1 C)] 98.7 F (37.1 C) (07/16 0400) Pulse Rate:  [86-110] 106 (07/16 0500) Resp:  [10-36] 29 (07/16 0500) BP: (103-148)/(44-82) 134/70 mmHg (07/16 0500) SpO2:  [88 %-100 %] 91 % (07/16 0500) Arterial Line BP: (105-171)/(68-127) 137/127 mmHg (07/16 0500)    Intake/Output from previous day: 07/15 0701 - 07/16 0700 In: 785.2 [P.O.:480; I.V.:55.2; IV Piggyback:250] Out: 1030 [Urine:900; Drains:130] Intake/Output this shift: Total I/O In: 125.5 [I.V.:25.5; IV Piggyback:100] Out: 610 [Urine:550; Drains:60]  General appearance: alert, cooperative and no distress Resp: breathing comfortably Cardio: regular rate and rhythm GI: soft, sl distended, but looks baseline, dressings OK.  Jp serosang Extremities: extremities normal, atraumatic, no cyanosis or edema  Lab Results:   Recent Labs  03/03/13 0430 03/04/13 0437  WBC 11.4* 10.3  HGB 10.2* 9.5*  HCT 31.3* 29.1*  PLT 153 136*   BMET  Recent Labs  03/03/13 0430 03/04/13 0437  NA 135 134*  K 3.9 4.2  CL 103 100  CO2 24 25  GLUCOSE 138* 209*  BUN 12 14  CREATININE 0.91 0.88  CALCIUM 7.9* 8.5   PT/INR  Recent Labs  03/03/13 0430  LABPROT 15.6*  INR 1.27   ABG No results found for this basename: PHART, PCO2, PO2, HCO3,  in the last 72 hours  Studies/Results: Dg Chest Port 1 View  03/03/2013   *RADIOLOGY REPORT*  Clinical Data: Respiratory failure.  PORTABLE CHEST - 1 VIEW  Comparison: Chest x-ray 01/15/2013.  Findings: Lung volumes are low.  There are linear opacities throughout the mid to lower lungs bilaterally, favored to represent areas of subsegmental atelectasis or scarring, however, developing air space  consolidation is difficult to entirely exclude.  No definite pleural effusions.  Mild pulmonary venous congestion, without frank pulmonary edema.  Heart size appears upper limits of normal.  Upper mediastinal contours have white and compared to the prior examination.  Atherosclerosis in the thoracic aorta.  IMPRESSION: 1.  Pulmonary venous congestion, without frank pulmonary edema. 2.  Linear opacities throughout the mid to lower lungs bilaterally, favored to predominately reflect atelectasis or scarring, however, developing air space consolidation is difficult to exclude. 3.  Interval development of mediastinal widening.  Although this may be accentuated by low lung volumes and slight kyphotic positioning, if there is clinical concern for mediastinal lymphadenopathy, this could be better evaluated with a standing PA and lateral chest radiograph, or follow-up chest CT with contrast. 4.  Atherosclerosis.   Original Report Authenticated By: Trudie Reed, M.D.    Anti-infectives: Anti-infectives   Start     Dose/Rate Route Frequency Ordered Stop   03/02/13 1700  cefOXitin (MEFOXIN) 1 g in dextrose 5 % 50 mL IVPB     1 g 100 mL/hr over 30 Minutes Intravenous Every 6 hours 03/02/13 1617 03/03/13 0527   03/02/13 0636  cefOXitin (MEFOXIN) 2 g in dextrose 5 % 50 mL IVPB     2 g 100 mL/hr over 30 Minutes Intravenous On call to O.R. 03/02/13 0636 03/02/13 0917      Assessment/Plan: s/p Procedure(s) with comments: LAPAROSCOPY DIAGNOSTIC LIVER ULTRASOUND OPEN PARTIAL HEPATECTOMY [83] - DX LAPAROSCOPY, INTRAOPERATIVE LIVER ULTRASOUND, OPEN  PARTIAL HEPATECTOMY Leave on clear liquids. Continue foley due to strict I&O, stepdown. Hope to transfer to floor tomorrow or later today.   Replete magnesium    LOS: 2 days    [facility] 03/04/2013

## 2013-03-05 ENCOUNTER — Inpatient Hospital Stay (HOSPITAL_COMMUNITY): Payer: Medicare Other

## 2013-03-05 DIAGNOSIS — R4182 Altered mental status, unspecified: Secondary | ICD-10-CM

## 2013-03-05 LAB — CBC
HCT: 27.7 % — ABNORMAL LOW (ref 39.0–52.0)
HCT: 30.7 % — ABNORMAL LOW (ref 39.0–52.0)
Hemoglobin: 9.1 g/dL — ABNORMAL LOW (ref 13.0–17.0)
Hemoglobin: 9.9 g/dL — ABNORMAL LOW (ref 13.0–17.0)
MCH: 28.3 pg (ref 26.0–34.0)
MCH: 28.4 pg (ref 26.0–34.0)
MCHC: 32.2 g/dL (ref 30.0–36.0)
MCHC: 32.9 g/dL (ref 30.0–36.0)
MCV: 86.6 fL (ref 78.0–100.0)
MCV: 87.7 fL (ref 78.0–100.0)
Platelets: 115 10*3/uL — ABNORMAL LOW (ref 150–400)
Platelets: 130 10*3/uL — ABNORMAL LOW (ref 150–400)
RBC: 3.2 MIL/uL — ABNORMAL LOW (ref 4.22–5.81)
RBC: 3.5 MIL/uL — ABNORMAL LOW (ref 4.22–5.81)
RDW: 14.8 % (ref 11.5–15.5)
RDW: 15 % (ref 11.5–15.5)
WBC: 10.6 10*3/uL — ABNORMAL HIGH (ref 4.0–10.5)
WBC: 11.5 10*3/uL — ABNORMAL HIGH (ref 4.0–10.5)

## 2013-03-05 LAB — BLOOD GAS, ARTERIAL
Acid-base deficit: 0 mmol/L (ref 0.0–2.0)
Bicarbonate: 23.9 mEq/L (ref 20.0–24.0)
Drawn by: 235321
O2 Content: 5 L/min
O2 Saturation: 92.5 %
Patient temperature: 37
TCO2: 22.2 mmol/L (ref 0–100)
pCO2 arterial: 38.1 mmHg (ref 35.0–45.0)
pH, Arterial: 7.414 (ref 7.350–7.450)
pO2, Arterial: 63.5 mmHg — ABNORMAL LOW (ref 80.0–100.0)

## 2013-03-05 LAB — COMPREHENSIVE METABOLIC PANEL
ALT: 404 U/L — ABNORMAL HIGH (ref 0–53)
AST: 180 U/L — ABNORMAL HIGH (ref 0–37)
Albumin: 3.8 g/dL (ref 3.5–5.2)
Alkaline Phosphatase: 110 U/L (ref 39–117)
BUN: 11 mg/dL (ref 6–23)
CO2: 28 mEq/L (ref 19–32)
Calcium: 9.2 mg/dL (ref 8.4–10.5)
Chloride: 100 mEq/L (ref 96–112)
Creatinine, Ser: 0.8 mg/dL (ref 0.50–1.35)
GFR calc Af Amer: >90 mL/min (ref >90)
GFR calc non Af Amer: 90 mL/min — ABNORMAL LOW (ref >90)
Glucose, Bld: 211 mg/dL — ABNORMAL HIGH (ref 70–99)
Potassium: 3.6 mEq/L (ref 3.5–5.1)
Sodium: 137 mEq/L (ref 135–145)
Total Bilirubin: 1.3 mg/dL — ABNORMAL HIGH (ref 0.3–1.2)
Total Protein: 7 g/dL (ref 6.0–8.3)

## 2013-03-05 LAB — BASIC METABOLIC PANEL
BUN: 11 mg/dL (ref 6–23)
CO2: 24 mEq/L (ref 19–32)
Calcium: 9.2 mg/dL (ref 8.4–10.5)
Chloride: 96 mEq/L (ref 96–112)
Creatinine, Ser: 0.82 mg/dL (ref 0.50–1.35)
GFR calc Af Amer: >90 mL/min (ref >90)
GFR calc non Af Amer: 89 mL/min — ABNORMAL LOW (ref >90)
Glucose, Bld: 253 mg/dL — ABNORMAL HIGH (ref 70–99)
Potassium: 3.6 mEq/L (ref 3.5–5.1)
Sodium: 133 mEq/L — ABNORMAL LOW (ref 135–145)

## 2013-03-05 LAB — GLUCOSE, CAPILLARY
Glucose-Capillary: 152 mg/dL — ABNORMAL HIGH (ref 70–99)
Glucose-Capillary: 163 mg/dL — ABNORMAL HIGH (ref 70–99)
Glucose-Capillary: 167 mg/dL — ABNORMAL HIGH (ref 70–99)
Glucose-Capillary: 197 mg/dL — ABNORMAL HIGH (ref 70–99)
Glucose-Capillary: 236 mg/dL — ABNORMAL HIGH (ref 70–99)
Glucose-Capillary: 329 mg/dL — ABNORMAL HIGH (ref 70–99)

## 2013-03-05 LAB — HEPATIC FUNCTION PANEL
ALT: 483 U/L — ABNORMAL HIGH (ref 0–53)
AST: 238 U/L — ABNORMAL HIGH (ref 0–37)
Albumin: 4 g/dL (ref 3.5–5.2)
Alkaline Phosphatase: 119 U/L — ABNORMAL HIGH (ref 39–117)
Bilirubin, Direct: 0.5 mg/dL — ABNORMAL HIGH (ref 0.0–0.3)
Indirect Bilirubin: 0.9 mg/dL (ref 0.3–0.9)
Total Bilirubin: 1.4 mg/dL — ABNORMAL HIGH (ref 0.3–1.2)
Total Protein: 7.4 g/dL (ref 6.0–8.3)

## 2013-03-05 LAB — PHOSPHORUS: Phosphorus: 2 mg/dL — ABNORMAL LOW (ref 2.3–4.6)

## 2013-03-05 LAB — LACTIC ACID, PLASMA: Lactic Acid, Venous: 2.8 mmol/L — ABNORMAL HIGH (ref 0.5–2.2)

## 2013-03-05 LAB — MAGNESIUM: Magnesium: 2 mg/dL (ref 1.5–2.5)

## 2013-03-05 LAB — AMMONIA: Ammonia: 26 umol/L (ref 11–60)

## 2013-03-05 MED ORDER — ACETAMINOPHEN 10 MG/ML IV SOLN
1000.0000 mg | Freq: Three times a day (TID) | INTRAVENOUS | Status: AC
  Administered 2013-03-05 – 2013-03-06 (×3): 1000 mg via INTRAVENOUS
  Filled 2013-03-05 (×3): qty 100

## 2013-03-05 MED ORDER — SODIUM CHLORIDE 0.9 % IV SOLN
INTRAVENOUS | Status: DC
  Administered 2013-03-06 – 2013-03-08 (×3): via INTRAVENOUS

## 2013-03-05 MED ORDER — SODIUM PHOSPHATE 3 MMOLE/ML IV SOLN: 30.0000 mmol | Freq: Once | INTRAVENOUS | Status: DC

## 2013-03-05 MED ORDER — HALOPERIDOL LACTATE 5 MG/ML IJ SOLN: 1.0000 mg | INTRAMUSCULAR | Status: DC | PRN

## 2013-03-05 MED ORDER — POTASSIUM PHOSPHATE DIBASIC 3 MMOLE/ML IV SOLN
30.0000 mmol | Freq: Once | INTRAVENOUS | Status: AC
  Administered 2013-03-05: 30 mmol via INTRAVENOUS
  Filled 2013-03-05: qty 10

## 2013-03-05 NOTE — Progress Notes (Signed)
Pt had run of SVT at this time. Pt sleeping and asymptomatic. Pt asked to cough and bear down which returned him to baseline cardiac rhythm of ST. Will monitor closely at this time.

## 2013-03-05 NOTE — Progress Notes (Signed)
7/16/ 2014 23:30 Pt became very agitated/combative/ confused. Security had to be called to help get pt calmed and back in bed.  Pt was trying to get out of the chair and trying to pull out IV lines and foley. Attempted to calm pt through talking and decreasing stimulation. ELink called, per orders haldol and fentanyl was given, 4 pt restraints started, and precedex drip started. Pt lifted back to bed. At this time pt calm and resting. Will continue to monitor.

## 2013-03-05 NOTE — Progress Notes (Signed)
eLink Physician-Brief Progress Note Patient Name: [name] DOB: 25-Oct-1945 MRN: [id number]  Date of Service  03/05/2013   HPI/Events of Note  Improved agittation after haldol x 2 and fentanyl x 1, Now RASS -1 on precedex  Labs   Recent Labs Lab 03/05/13 0030  PHART 7.414  PCO2ART 38.1  PO2ART 63.5*  HCO3 23.9  TCO2 22.2  O2SAT 92.5   Bmet, cbc, and lacate pending    eICU Interventions  Await pending labs Continue precedex   Intervention Category Minor Interventions: Other:  [name] 03/05/2013, 12:50 AM

## 2013-03-05 NOTE — Progress Notes (Signed)
PULMONARY  / CRITICAL CARE MEDICINE  Name: [name] MRN: [id number] DOB: November 04, 1945    ADMISSION DATE:  03/02/2013 CONSULTATION DATE:  7/14  REFERRING MD :  Donell Beers PRIMARY SERVICE: CCS  CHIEF COMPLAINT:  S/P resection of hepatocellular carcinoma  BRIEF PATIENT DESCRIPTION: 67 M admitted to ICU after elective resection of hepatocellular carcinoma with post op somnolence/narcosis and compromised resp status. PCCM asked to assist in monitoring and mgmt. PMH of OSA, narcolepsy, DMII, former smoker. 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  VITAL SIGNS: Temp:  [97.7 F (36.5 C)-99.7 F (37.6 C)] 98.9 F (37.2 C) (07/17 0400) Pulse Rate:  [75-137] 75 (07/17 0600) Resp:  [0-45] 8 (07/17 0600) BP: (113-175)/(56-84) 120/60 mmHg (07/17 0600) SpO2:  [87 %-100 %] 96 % (07/17 0744) Arterial Line BP: (129-187)/(58-136) 140/85 mmHg (07/17 0300) FiO2 (%):  [50 %] 50 % (07/17 0744)  HEMODYNAMICS:   VENTILATOR SETTINGS: Vent Mode:  [-]  FiO2 (%):  [50 %] 50 %  INTAKE / OUTPUT: Intake/Output     07/16 0701 - 07/17 0700 07/17 0701 - 07/18 0700   P.O. 240    I.V. (mL/kg) 75.4 (0.8)    Other 150    IV Piggyback 200    Total Intake(mL/kg) 665.4 (6.7)    Urine (mL/kg/hr) 2600 (1.1)    Drains 120 (0.1)    Total Output 2720     Net -2054.7           PHYSICAL EXAMINATION: General:  WDWN, confused, snoring respirations but easily arousable to name. Neuro: RASS -1, MAEs, not F/C. HEENT: Pupils narrow but reactive, o/w WNL. Cardiovascular: RRR s M. Lungs: clear anteriorly without wheezes. Abdomen: surgical dressings noted, mildly diffusely tender. Ext: warm, without edema.  LABS:   Recent Labs Lab  03/03/13 0430 03/03/13 2045 03/04/13 0437 03/05/13 0030 03/05/13 0038 03/05/13 0540  HGB 10.2*  --  9.5*  --  9.9* 9.1*  WBC 11.4*  --  10.3  --  11.5* 10.6*  PLT 153  --  136*  --  130* 115*  NA 135  --  134*  --  133* 137  K 3.9  --  4.2  --  3.6 3.6  CL 103  --  100  --  96 100  CO2 24  --  25  --  24 28  GLUCOSE 138*  --  209*  --  253* 211*  BUN 12  --  14  --  11 11  CREATININE 0.91  --  0.88  --  0.82 0.80  CALCIUM 7.9*  --  8.5  --  9.2 9.2  MG 1.5 2.1 2.1  --   --  2.0  PHOS 4.5  --  3.1  --   --  2.0*  AST 266*  --  543*  --  238* 180*  ALT 214*  --  672*  --  483* 404*  ALKPHOS 71  --  127*  --  119* 110  BILITOT 0.7  --  1.0  --  1.4* 1.3*  PROT 6.3  --  7.2  --  7.4 7.0  ALBUMIN 2.8*  --  3.8  --  4.0 3.8  APTT 26  --   --   --   --   --  INR 1.27  --   --   --   --   --   LATICACIDVEN  --   --   --  2.8*  --   --   PHART  --   --   --  7.414  --   --   PCO2ART  --   --   --  38.1  --   --   PO2ART  --   --   --  63.5*  --   --    Recent Labs Lab 03/04/13 1150 03/04/13 1527 03/04/13 1916 03/04/13 2321 03/05/13 0035  GLUCAP 189* 182* 170* 329* 236*   CXR:   ASSESSMENT / PLAN:  PULMONARY A: Post op respiratory compromise due to narcosis/ prolonged anesthesia Former smoker H/O OSA - unclear if treated P:   - Supplemental O2 as needed. - Empiric BDs. - IS and flutter valve. - Hold in SDU today due to confusion and needing precedex.  CARDIOVASCULAR A: H/O Htn  P:  - Cont norvasc for BP control. - Will continue HCTZ 12.5 mg daily and spironolactone 25 mg PO QD (on BID at home) now that is taking PO. - No additional anti-HTN since his high BP is elevated now due to agitation.  RENAL A: Hypo K and Phos. P:   - Monitor BMET intermittently. - Correct electrolytes as indicated. - Continue HCTZ and spironolactone.  GASTROINTESTINAL A:  P lap, partial L hepatic lobectomy P:   - Post op mgmt per CCS. - Nutrition per CCS, clear liquid and pills  ok for now.  HEMATOLOGIC A:  No issues P:  - Monitor CBC intermittently.  INFECTIOUS A:  No issues, had peri-op abx but not continued. P:   - Monitor.  ENDOCRINE A:  DMII Post op hyperglycemia P:   - Changed to regular non-preg from insulin drip since CBGs are now more stable. - Once PO intake is back to normal will need to restart Metformin 1000 mg PO BID.  NEUROLOGIC A: Post op narcosis - opioids/prolonged anesthesia H/O narcolepsy Post op pain P:   - Cont Adderal. - D/C opiate PCA, PRN fentanyl only. - Precedex as needed. - Haldol 1-4 mg IV q3 hours PRN. - Will need to be assessed for OSA as outpatient. - Check ammonia level.  TODAY'S SUMMARY: Up in a chair most of yesterday, improving, will restart his other anti-HTN, will need metformin back once eating normally and will need sleep study as outpatient.  I have personally obtained a history, examined the patient, evaluated laboratory and imaging results, formulated the assessment and plan and placed orders.  [name], M.D. [facility] Pulmonary/Critical Care Medicine. Pager: [phone]. After hours pager: [phone].

## 2013-03-05 NOTE — Progress Notes (Signed)
Inpatient Diabetes Program Recommendations  AACE/ADA: New Consensus Statement on Inpatient Glycemic Control (2013)  Target Ranges:  Prepandial:   less than 140 mg/dL      Peak postprandial:   less than 180 mg/dL (1-2 hours)      Critically ill patients:  140 - 180 mg/dL   Reason for Visit: Hyperglycemia Results for [name], [name] (MRN [id number]) as of 03/05/2013 11:41  Ref. Range 03/04/2013 15:27 03/04/2013 19:16 03/04/2013 23:21 03/05/2013 00:35 03/05/2013 07:38  Glucose-Capillary Latest Range: 70-99 mg/dL 308 (H) 657 (H) 846 (H) 236 (H) 152 (H)  Results for [name], JAGGERS (MRN [id number]) as of 03/05/2013 11:41  Ref. Range 03/04/2013 04:37 03/05/2013 00:38 03/05/2013 05:40  Glucose Latest Range: 70-99 mg/dL 324 (H) 401 (H) 027 (H)     Inpatient Diabetes Program Recommendations Insulin - Basal: Consider addition of basal insulin - Lantus 10 units QHS HgbA1C: Need updated HgbA1C to assess glycemic control prior to hospitalization - Last one 09/08/2012 - 9.5% Diet: When advanced, CHO mod med  Note: Will follow.  Thank you. Ailene Ards, RD, LDN, CDE Inpatient Diabetes Coordinator [phone]

## 2013-03-05 NOTE — Progress Notes (Signed)
Patient ID: [name], male   DOB: [birth year], 67 y.o.   MRN: [id number] 3 Days Post-Op  Subjective: Confused last night, flipped out.  Placed on precedex, got haldol.  Sl hypoxic.      Objective: Vital signs in last 24 hours: Temp:  [98.9 F (37.2 C)-99.7 F (37.6 C)] 99.7 F (37.6 C) (07/17 0800) Pulse Rate:  [75-137] 104 (07/17 1200) Resp:  [0-27] 17 (07/17 1200) BP: (113-175)/(56-84) 161/72 mmHg (07/17 1200) SpO2:  [87 %-100 %] 94 % (07/17 1200) Arterial Line BP: (129-187)/(58-136) 137/116 mmHg (07/17 1200) FiO2 (%):  [50 %] 50 % (07/17 0744)    Intake/Output from previous day: 07/16 0701 - 07/17 0700 In: 665.4 [P.O.:240; I.V.:75.4; IV Piggyback:200] Out: 2720 [Urine:2600; Drains:120] Intake/Output this shift: Total I/O In: 567.7 [I.V.:37.7; Other:20; IV Piggyback:510] Out: 1100 [Urine:1050; Drains:50]  General appearance: opens eyes, but confused.  Disoriented.   Resp: breathing comfortably Cardio: regular rate and rhythm GI: soft, sl distended, but looks baseline, dressings OK.  Jp serosang Extremities: extremities normal, atraumatic, no cyanosis or edema  Lab Results:   Recent Labs  03/05/13 0038 03/05/13 0540  WBC 11.5* 10.6*  HGB 9.9* 9.1*  HCT 30.7* 27.7*  PLT 130* 115*   BMET  Recent Labs  03/05/13 0038 03/05/13 0540  NA 133* 137  K 3.6 3.6  CL 96 100  CO2 24 28  GLUCOSE 253* 211*  BUN 11 11  CREATININE 0.82 0.80  CALCIUM 9.2 9.2   PT/INR  Recent Labs  03/03/13 0430  LABPROT 15.6*  INR 1.27   ABG  Recent Labs  03/05/13 0030  PHART 7.414  HCO3 23.9    Studies/Results: Dg Chest Port 1 View  03/05/2013   *RADIOLOGY REPORT*  Clinical Data: Hypoxemia.  PORTABLE CHEST - 1 VIEW  Comparison: Single view of the chest 03/03/2013 and 01/15/2013.  Findings: Lung volumes are low.  Right basilar airspace opacities appear slightly increased, more notable on the right.  Heart size is upper normal with vascular congestion.  IMPRESSION:   1. Some increase in basilar airspace opacities likely due to atelectasis in this low-volume chest. 2.  Pulmonary vascular congestion.   Original Report Authenticated By: Holley Dexter, M.D.    Anti-infectives: Anti-infectives   Start     Dose/Rate Route Frequency Ordered Stop   03/02/13 1700  cefOXitin (MEFOXIN) 1 g in dextrose 5 % 50 mL IVPB     1 g 100 mL/hr over 30 Minutes Intravenous Every 6 hours 03/02/13 1617 03/03/13 0527   03/02/13 0636  cefOXitin (MEFOXIN) 2 g in dextrose 5 % 50 mL IVPB     2 g 100 mL/hr over 30 Minutes Intravenous On call to O.R. 03/02/13 0636 03/02/13 0917      Assessment/Plan: s/p Procedure(s) with comments: LAPAROSCOPY DIAGNOSTIC LIVER ULTRASOUND OPEN PARTIAL HEPATECTOMY [83] - DX LAPAROSCOPY, INTRAOPERATIVE LIVER ULTRASOUND, OPEN PARTIAL HEPATECTOMY Leave on clear liquids. Acute ICU encephalopathy/delirium - ICU managing Avoid narcotics. Add additional day of tylenol IV.  Await path. Hypertension - home meds.    LOS: 3 days    [facility] 03/05/2013

## 2013-03-06 ENCOUNTER — Inpatient Hospital Stay (HOSPITAL_COMMUNITY): Payer: Medicare Other

## 2013-03-06 LAB — CBC
HCT: 32.1 % — ABNORMAL LOW (ref 39.0–52.0)
Hemoglobin: 10.4 g/dL — ABNORMAL LOW (ref 13.0–17.0)
MCH: 28 pg (ref 26.0–34.0)
MCHC: 32.4 g/dL (ref 30.0–36.0)
MCV: 86.3 fL (ref 78.0–100.0)
Platelets: 150 10*3/uL (ref 150–400)
RBC: 3.72 MIL/uL — ABNORMAL LOW (ref 4.22–5.81)
RDW: 14.9 % (ref 11.5–15.5)
WBC: 10.1 10*3/uL (ref 4.0–10.5)

## 2013-03-06 LAB — COMPREHENSIVE METABOLIC PANEL
ALT: 296 U/L — ABNORMAL HIGH (ref 0–53)
AST: 84 U/L — ABNORMAL HIGH (ref 0–37)
Albumin: 3.7 g/dL (ref 3.5–5.2)
Alkaline Phosphatase: 107 U/L (ref 39–117)
BUN: 11 mg/dL (ref 6–23)
CO2: 29 mEq/L (ref 19–32)
Calcium: 9.9 mg/dL (ref 8.4–10.5)
Chloride: 96 mEq/L (ref 96–112)
Creatinine, Ser: 0.85 mg/dL (ref 0.50–1.35)
GFR calc Af Amer: >90 mL/min (ref >90)
GFR calc non Af Amer: 88 mL/min — ABNORMAL LOW (ref >90)
Glucose, Bld: 139 mg/dL — ABNORMAL HIGH (ref 70–99)
Potassium: 2.9 mEq/L — ABNORMAL LOW (ref 3.5–5.1)
Sodium: 137 mEq/L (ref 135–145)
Total Bilirubin: 1.3 mg/dL — ABNORMAL HIGH (ref 0.3–1.2)
Total Protein: 7.4 g/dL (ref 6.0–8.3)

## 2013-03-06 LAB — TYPE AND SCREEN
ABO/RH(D): B POS
Antibody Screen: POSITIVE
Donor AG Type: NEGATIVE
Donor AG Type: NEGATIVE
Donor AG Type: NEGATIVE
Donor AG Type: NEGATIVE
Donor AG Type: NEGATIVE
Donor AG Type: NEGATIVE
Donor AG Type: NEGATIVE
Unit division: 0
Unit division: 0
Unit division: 0
Unit division: 0
Unit division: 0
Unit division: 0
Unit division: 0

## 2013-03-06 LAB — GLUCOSE, CAPILLARY
Glucose-Capillary: 130 mg/dL — ABNORMAL HIGH (ref 70–99)
Glucose-Capillary: 157 mg/dL — ABNORMAL HIGH (ref 70–99)
Glucose-Capillary: 218 mg/dL — ABNORMAL HIGH (ref 70–99)
Glucose-Capillary: 165 mg/dL — ABNORMAL HIGH (ref 70–99)
Glucose-Capillary: 175 mg/dL — ABNORMAL HIGH (ref 70–99)
Glucose-Capillary: 187 mg/dL — ABNORMAL HIGH (ref 70–99)

## 2013-03-06 LAB — PHOSPHORUS: Phosphorus: 3.9 mg/dL (ref 2.3–4.6)

## 2013-03-06 LAB — MAGNESIUM: Magnesium: 1.9 mg/dL (ref 1.5–2.5)

## 2013-03-06 MED ORDER — POTASSIUM CHLORIDE 10 MEQ/100ML IV SOLN
10.0000 meq | INTRAVENOUS | Status: AC
  Administered 2013-03-06 (×4): 10 meq via INTRAVENOUS
  Filled 2013-03-06 (×4): qty 100

## 2013-03-06 MED ORDER — MAGNESIUM SULFATE IN D5W 10-5 MG/ML-% IV SOLN
1.0000 g | Freq: Once | INTRAVENOUS | Status: AC
  Administered 2013-03-06: 1 g via INTRAVENOUS
  Filled 2013-03-06: qty 100

## 2013-03-06 MED ORDER — SPIRONOLACTONE 25 MG PO TABS
25.0000 mg | ORAL_TABLET | Freq: Two times a day (BID) | ORAL | Status: DC
  Administered 2013-03-06 – 2013-03-09 (×6): 25 mg via ORAL
  Filled 2013-03-06 (×8): qty 1

## 2013-03-06 MED ORDER — INSULIN ASPART 100 UNIT/ML ~~LOC~~ SOLN
0.0000 [IU] | Freq: Three times a day (TID) | SUBCUTANEOUS | Status: DC
  Administered 2013-03-06 (×2): 3 [IU] via SUBCUTANEOUS
  Administered 2013-03-07: 2 [IU] via SUBCUTANEOUS
  Administered 2013-03-07 (×2): 3 [IU] via SUBCUTANEOUS
  Administered 2013-03-07 – 2013-03-08 (×2): 2 [IU] via SUBCUTANEOUS
  Administered 2013-03-08: 3 [IU] via SUBCUTANEOUS
  Administered 2013-03-08 – 2013-03-09 (×3): 2 [IU] via SUBCUTANEOUS

## 2013-03-06 MED ORDER — POTASSIUM CHLORIDE CRYS ER 20 MEQ PO TBCR
40.0000 meq | EXTENDED_RELEASE_TABLET | Freq: Once | ORAL | Status: AC
  Administered 2013-03-06: 40 meq via ORAL
  Filled 2013-03-06: qty 1

## 2013-03-06 NOTE — Care Management Note (Addendum)
    Page 1 of 2   03/09/2013     2:24:31 PM   CARE MANAGEMENT NOTE 03/09/2013  Patient:  [name], [name]   Account Number:  [id number]  Date Initiated:  03/03/2013  Documentation initiated by:  DAVIS,RHONDA  Subjective/Objective Assessment:   surgical patient with post complication of prolonged anesthesia recovery.  low bp required iv volume support, iv insulin drip post op,     Action/Plan:   home when stable   Anticipated DC Date:  03/09/2013   Anticipated DC Plan:  HOME/SELF CARE  In-house referral  NA      DC Planning Services  CM consult      PAC Choice  NA   Choice offered to / List presented to:  NA   DME arranged  NA      DME agency  NA     HH arranged  NA      HH agency  NA   Status of service:  Completed, signed off Medicare Important Message given?  NA - LOS <3 / Initial given by admissions (If response is "NO", the following Medicare IM given date fields will be blank) Date Medicare IM given:   Date Additional Medicare IM given:    Discharge Disposition:  HOME/SELF CARE  Per UR Regulation:  Reviewed for med. necessity/level of care/duration of stay  If discussed at Long Length of Stay Meetings, dates discussed:    Comments:  03/09/13 Halima Fogal RN,BSN NCM [phone] D/C HOME NO ORDERS OR NEEDS.  03/06/13 Ashleah Valtierra RN,BSN NCM [phone] TRANSFER FROM SDU.POD#4 LAP PARTIAL L HEPATIC LOBECTOMY,FULL LIQ DIET.D/C PLAN HOME.  [id number] Earlene Plater, RN, BSN, CCM:  CHART REVIEWED AND UPDATED.  Next chart review due on [id number]. NO DISCHARGE NEEDS PRESENT AT THIS TIME. CASE MANAGEMENT [phone]

## 2013-03-06 NOTE — Progress Notes (Signed)
eLink Physician Progress Note and Electrolyte Replacement  Patient Name: [name] DOB: [birth year] MRN: [id number]  Date of Service  03/06/2013   HPI/Events of Note    Recent Labs Lab 03/02/13 1235 03/03/13 0430 03/03/13 2045 03/04/13 0437 03/05/13 0038 03/05/13 0540 03/06/13 0330  NA 138 135  --  134* 133* 137 137  K 3.8 3.9  --  4.2 3.6 3.6 2.9*  CL  --  103  --  100 96 100 96  CO2  --  24  --  25 24 28 29   GLUCOSE 290* 138*  --  209* 253* 211* 139*  BUN  --  12  --  14 11 11 11   CREATININE  --  0.91  --  0.88 0.82 0.80 0.85  CALCIUM  --  7.9*  --  8.5 9.2 9.2 9.9  MG  --  1.5 2.1 2.1  --  2.0 1.9  PHOS  --  4.5  --  3.1  --  2.0* 3.9    Estimated Creatinine Clearance: 101.9 ml/min (by C-G formula based on Cr of 0.85).  Intake/Output     07/17 0701 - 07/18 0700   P.O. 240   I.V. (mL/kg) 147.7 (1.5)   Other 20   IV Piggyback 710   Total Intake(mL/kg) 1117.7 (11.2)   Urine (mL/kg/hr) 2690 (1.1)   Drains 118 (0)   Total Output 2808   Net -1690.3        - I/O DETAILED x 24h    Total I/O In: 440 [P.O.:240; I.V.:100; IV Piggyback:100] Out: 978 [Urine:940; Drains:38] - I/O THIS SHIFT    ASSESSMENT Low K Slightly Low Mag Runs of SVT last night  eICURN Interventions  kcl 40 po + 40 IV Mag 1gm    ASSESSMENT: MAJOR ELECTROLYTE      Dr. Kalman Shan, M.D., [facility].C.P Pulmonary and Critical Care Medicine Staff Physician [facility] Pulmonary and Critical Care Pager: [phone], If no answer or between  15:00h - 7:00h: call [phone]  03/06/2013 5:24 AM

## 2013-03-06 NOTE — Progress Notes (Signed)
PULMONARY  / CRITICAL CARE MEDICINE  Name: [name] MRN: [id number] DOB: Jan 08, 1946    ADMISSION DATE:  03/02/2013 CONSULTATION DATE:  7/14  REFERRING MD :  Donell Beers PRIMARY SERVICE: [facility]  CHIEF COMPLAINT:  S/P resection of hepatocellular carcinoma  BRIEF PATIENT DESCRIPTION: 68 M admitted to ICU after elective resection of hepatocellular carcinoma with post op somnolence/narcosis and compromised resp status. PCCM asked to assist in monitoring and mgmt. PMH of OSA, narcolepsy, DMII, former smoker. 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  VITAL SIGNS: Temp:  [98 F (36.7 C)-99.2 F (37.3 C)] 98.8 F (37.1 C) (07/18 0800) Pulse Rate:  [97-167] 106 (07/18 0800) Resp:  [0-20] 19 (07/18 0800) BP: (107-162)/(56-78) 125/56 mmHg (07/18 0800) SpO2:  [93 %-100 %] 93 % (07/18 0800) Arterial Line BP: (119-160)/(77-116) 119/77 mmHg (07/17 1600)  HEMODYNAMICS:   VENTILATOR SETTINGS:    INTAKE / OUTPUT: Intake/Output     07/17 0701 - 07/18 0700 07/18 0701 - 07/19 0700   P.O. 240    I.V. (mL/kg) 147.7 (1.5) 30 (0.3)   Other 20    IV Piggyback 1110 100   Total Intake(mL/kg) 1517.7 (15.2) 130 (1.3)   Urine (mL/kg/hr) 2780 (1.2) 100 (0.5)   Drains 118 (0)    Total Output 2898 100   Net -1380.3 +30         PHYSICAL EXAMINATION: General:  WDWN, confused, snoring respirations but easily arousable to name. Neuro: RASS -1, MAEs, not F/C. HEENT: Pupils narrow but reactive, o/w WNL. Cardiovascular: RRR s M. Lungs: clear anteriorly without wheezes. Abdomen: surgical dressings noted, mildly diffusely tender. Ext: warm, without edema.  LABS:   Recent Labs Lab 03/03/13 0430  03/04/13 0437  03/05/13 0030 03/05/13 0038 03/05/13 0540 03/06/13 0330  HGB 10.2*  --  9.5*  --  9.9* 9.1* 10.4*  WBC 11.4*  --  10.3  --  11.5* 10.6* 10.1  PLT 153  --  136*  --  130* 115* 150  NA 135  --  134*  --  133* 137 137  K 3.9  --  4.2  --  3.6 3.6 2.9*  CL 103  --  100  --  96 100 96  CO2 24  --  25  --  24 28 29   GLUCOSE 138*  --  209*  --  253* 211* 139*  BUN 12  --  14  --  11 11 11   CREATININE 0.91  --  0.88  --  0.82 0.80 0.85  CALCIUM 7.9*  --  8.5  --  9.2 9.2 9.9  MG 1.5  < > 2.1  --   --  2.0 1.9  PHOS 4.5  --  3.1  --   --  2.0* 3.9  AST 266*  --  543*  --  238* 180* 84*  ALT 214*  --  672*  --  483* 404* 296*  ALKPHOS 71  --  127*  --  119* 110 107  BILITOT 0.7  --  1.0  --  1.4* 1.3* 1.3*  PROT 6.3  --  7.2  --  7.4 7.0 7.4  ALBUMIN 2.8*  --  3.8  --  4.0 3.8 3.7  APTT 26  --   --   --   --   --   --  INR 1.27  --   --   --   --   --   --   LATICACIDVEN  --   --   --  2.8*  --   --   --   PHART  --   --   --  7.414  --   --   --   PCO2ART  --   --   --  38.1  --   --   --   PO2ART  --   --   --  63.5*  --   --   --   < > = values in this interval not displayed.  Recent Labs Lab 03/05/13 1553 03/05/13 1949 03/06/13 0032 03/06/13 0335 03/06/13 0714  GLUCAP 197* 167* 157* 130* 218*   CXR:   ASSESSMENT / PLAN:  PULMONARY A: Post op respiratory compromise due to narcosis/ prolonged anesthesia Former smoker H/O OSA - unclear if treated P:   - Supplemental O2 as needed. - Empiric BDs. - IS and flutter valve. - Transfer to tele.  CARDIOVASCULAR A: H/O Htn  P:  - Cont norvasc for BP control. - Will continue HCTZ 12.5 mg daily and increase spironolactone 25 mg PO BID now that is taking PO.  RENAL A: Hypo K and Phos. P:   - Monitor BMET intermittently. - Correct electrolytes as indicated. - Continue HCTZ and spironolactone but increase to BID for spirinolactone.  GASTROINTESTINAL A:  P lap, partial L hepatic lobectomy P:   - Post op mgmt per  [facility]. - Nutrition per [facility], clear liquid and pills ok for now.  HEMATOLOGIC A:  No issues P:  - Monitor CBC intermittently.  INFECTIOUS A:  No issues, had peri-op abx but not continued. P:   - Monitor.  ENDOCRINE A:  DMII Post op hyperglycemia P:   - Changed to regular non-preg from insulin drip since CBGs are now more stable. - Once PO intake is back to normal will need to restart Metformin 1000 mg PO BID.  NEUROLOGIC A: Post op narcosis - opioids/prolonged anesthesia H/O narcolepsy Post op pain P:   - Cont Adderal. - D/C opiate PCA, PRN fentanyl only. - D/C Precedex. - Haldol 1-4 mg IV q3 hours PRN. - Will need to be assessed for OSA as outpatient.  TODAY'S SUMMARY: Stable overnight, no agitation, will transfer to tele and PCCM will sign off, please call back if needed.  Will need a sleep study as outpatient and sleep referral for narcolepsy.  I have personally obtained a history, examined the patient, evaluated laboratory and imaging results, formulated the assessment and plan and placed orders.  [name], M.D. [facility] Pulmonary/Critical Care Medicine. Pager: [phone]. After hours pager: [phone].

## 2013-03-06 NOTE — Progress Notes (Signed)
Patient ID: [name], male   DOB: 09-20-1945, 67 y.o.   MRN: [id number] 4 Days Post-Op  Subjective: No C/O x sore RUQ.  Pos flatus. No nausea.  Much better night with confusion resolved  Objective: Vital signs in last 24 hours: Temp:  [98 F (36.7 C)-99.2 F (37.3 C)] 98.8 F (37.1 C) (07/18 0800) Pulse Rate:  [97-167] 106 (07/18 0800) Resp:  [0-20] 19 (07/18 0800) BP: (107-162)/(56-78) 125/56 mmHg (07/18 0800) SpO2:  [93 %-100 %] 93 % (07/18 0800) Arterial Line BP: (119-160)/(77-116) 119/77 mmHg (07/17 1600)    Intake/Output from previous day: 07/17 0701 - 07/18 0700 In: 1517.7 [P.O.:240; I.V.:147.7; IV Piggyback:1110] Out: 2898 [Urine:2780; Drains:118] Intake/Output this shift: Total I/O In: 130 [I.V.:30; IV Piggyback:100] Out: 100 [Urine:100]  General appearance: alert, cooperative and no distress Resp: clear to auscultation bilaterally GI:  Soft and non tender Incision/Wound: Clean and dry  Lab Results:   Recent Labs  03/05/13 0540 03/06/13 0330  WBC 10.6* 10.1  HGB 9.1* 10.4*  HCT 27.7* 32.1*  PLT 115* 150   BMET  Recent Labs  03/05/13 0540 03/06/13 0330  NA 137 137  K 3.6 2.9*  CL 100 96  CO2 28 29  GLUCOSE 211* 139*  BUN 11 11  CREATININE 0.80 0.85  CALCIUM 9.2 9.9     Studies/Results: Dg Chest Port 1 View  03/06/2013   *RADIOLOGY REPORT*  Clinical Data: Hypoxemia, hypertension, history of cancer  PORTABLE CHEST - 1 VIEW  Comparison: 03/05/2013; 03/03/2013  Findings:  Grossly unchanged enlarged cardiac silhouette and mediastinal contours.  The pulmonary vasculature remains indistinct with cephalization of flow.  Minimal improved aeration of the right lower lung with persistent bilateral perihilar and left basilar heterogeneous opacities.  No new focal airspace opacities.  The pulmonary vasculature remains indistinct with cephalization of flow.  No definite pleural effusion or pneumothorax.  Unchanged bones.  IMPRESSION: Grossly unchanged  findings suggestive of mild pulmonary edema and scattered bilateral atelectasis, but underlying infection not excluded.  A follow-up chest radiograph in 4 to 6 weeks after treatment is recommended to ensure resolution.   Original Report Authenticated By: Tacey Ruiz, MD   Dg Chest Port 1 View  03/05/2013   *RADIOLOGY REPORT*  Clinical Data: Hypoxemia.  PORTABLE CHEST - 1 VIEW  Comparison: Single view of the chest 03/03/2013 and 01/15/2013.  Findings: Lung volumes are low.  Right basilar airspace opacities appear slightly increased, more notable on the right.  Heart size is upper normal with vascular congestion.  IMPRESSION:  1. Some increase in basilar airspace opacities likely due to atelectasis in this low-volume chest. 2.  Pulmonary vascular congestion.   Original Report Authenticated By: Holley Dexter, M.D.    Anti-infectives: Anti-infectives   Start     Dose/Rate Route Frequency Ordered Stop   03/02/13 1700  cefOXitin (MEFOXIN) 1 g in dextrose 5 % 50 mL IVPB     1 g 100 mL/hr over 30 Minutes Intravenous Every 6 hours 03/02/13 1617 03/03/13 0527   03/02/13 0636  cefOXitin (MEFOXIN) 2 g in dextrose 5 % 50 mL IVPB     2 g 100 mL/hr over 30 Minutes Intravenous On call to O.R. 03/02/13 0636 03/02/13 0917      Assessment/Plan: s/p Procedure(s): LAPAROSCOPY DIAGNOSTIC LIVER ULTRASOUND OPEN PARTIAL HEPATECTOMY [83] Doing well.  Transfer to telemetry Full liquid diet D/C Foley   LOS: 4 days    Renai Lopata T 03/06/2013

## 2013-03-06 NOTE — Progress Notes (Signed)
NT notified RN that patients JP drain was pulled out during transfer from bed to chair by patient. RN assessed patient, drain had pulled out 2 cm and bulb had become disconnected, abd pad was placed over site and JP reconnected, minimal drainage noted. Notifed CCS. VSS, will continue to monitor patient.

## 2013-03-07 LAB — GLUCOSE, CAPILLARY
Glucose-Capillary: 185 mg/dL — ABNORMAL HIGH (ref 70–99)
Glucose-Capillary: 147 mg/dL — ABNORMAL HIGH (ref 70–99)
Glucose-Capillary: 164 mg/dL — ABNORMAL HIGH (ref 70–99)
Glucose-Capillary: 142 mg/dL — ABNORMAL HIGH (ref 70–99)

## 2013-03-07 LAB — COMPREHENSIVE METABOLIC PANEL
ALT: 174 U/L — ABNORMAL HIGH (ref 0–53)
AST: 40 U/L — ABNORMAL HIGH (ref 0–37)
Albumin: 3.4 g/dL — ABNORMAL LOW (ref 3.5–5.2)
Alkaline Phosphatase: 113 U/L (ref 39–117)
BUN: 26 mg/dL — ABNORMAL HIGH (ref 6–23)
CO2: 26 mEq/L (ref 19–32)
Calcium: 9.7 mg/dL (ref 8.4–10.5)
Chloride: 94 mEq/L — ABNORMAL LOW (ref 96–112)
Creatinine, Ser: 1.08 mg/dL (ref 0.50–1.35)
GFR calc Af Amer: 79 mL/min — ABNORMAL LOW (ref >90)
GFR calc non Af Amer: 69 mL/min — ABNORMAL LOW (ref >90)
Glucose, Bld: 191 mg/dL — ABNORMAL HIGH (ref 70–99)
Potassium: 3.6 mEq/L (ref 3.5–5.1)
Sodium: 133 mEq/L — ABNORMAL LOW (ref 135–145)
Total Bilirubin: 1.1 mg/dL (ref 0.3–1.2)
Total Protein: 7.3 g/dL (ref 6.0–8.3)

## 2013-03-07 LAB — MAGNESIUM: Magnesium: 2.2 mg/dL (ref 1.5–2.5)

## 2013-03-07 LAB — CBC
HCT: 32.7 % — ABNORMAL LOW (ref 39.0–52.0)
Hemoglobin: 10.9 g/dL — ABNORMAL LOW (ref 13.0–17.0)
MCH: 28.4 pg (ref 26.0–34.0)
MCHC: 33.3 g/dL (ref 30.0–36.0)
MCV: 85.2 fL (ref 78.0–100.0)
Platelets: 200 10*3/uL (ref 150–400)
RBC: 3.84 MIL/uL — ABNORMAL LOW (ref 4.22–5.81)
RDW: 15.5 % (ref 11.5–15.5)
WBC: 10.5 10*3/uL (ref 4.0–10.5)

## 2013-03-07 LAB — PHOSPHORUS: Phosphorus: 4.4 mg/dL (ref 2.3–4.6)

## 2013-03-07 MED ORDER — METFORMIN HCL 500 MG PO TABS
1000.0000 mg | ORAL_TABLET | Freq: Two times a day (BID) | ORAL | Status: DC
  Administered 2013-03-07 – 2013-03-09 (×4): 1000 mg via ORAL
  Filled 2013-03-07 (×6): qty 2

## 2013-03-07 NOTE — Progress Notes (Signed)
Patient ID: TRYSTIAN CRISANTO, male   DOB: 1945/12/14, 67 y.o.   MRN: [id number] 5 Days Post-Op  Subjective:  sore RUQ.  Pos flatus. No nausea.  Transferred to floor yesterday.  JP pulled out a bit but still holding suction well Objective: Vital signs in last 24 hours: Temp:  [97.9 F (36.6 C)-99 F (37.2 C)] 98 F (36.7 C) (07/19 6578) Pulse Rate:  [101-115] 103 (07/19 0642) Resp:  [15-18] 18 (07/19 0642) BP: (103-139)/(58-79) 103/58 mmHg (07/19 0642) SpO2:  [90 %-98 %] 90 % (07/19 0746) Last BM Date: 03/07/13  Intake/Output from previous day: 07/18 0701 - 07/19 0700 In: 280 [I.V.:80; IV Piggyback:200] Out: 1050 [Urine:600; Drains:450] Intake/Output this shift:    General appearance: alert, cooperative and no distress Resp: clear to auscultation bilaterally GI:  Soft and non tender Incision/Wound: Clean and dry JP: brown colored liquid, bulb full  Lab Results:   Recent Labs  03/06/13 0330 03/07/13 0603  WBC 10.1 10.5  HGB 10.4* 10.9*  HCT 32.1* 32.7*  PLT 150 200   BMET  Recent Labs  03/06/13 0330 03/07/13 0603  NA 137 133*  K 2.9* 3.6  CL 96 94*  CO2 29 26  GLUCOSE 139* 191*  BUN 11 26*  CREATININE 0.85 1.08  CALCIUM 9.9 9.7     Studies/Results: Dg Chest Port 1 View  03/06/2013   *RADIOLOGY REPORT*  Clinical Data: Hypoxemia, hypertension, history of cancer  PORTABLE CHEST - 1 VIEW  Comparison: 03/05/2013; 03/03/2013  Findings:  Grossly unchanged enlarged cardiac silhouette and mediastinal contours.  The pulmonary vasculature remains indistinct with cephalization of flow.  Minimal improved aeration of the right lower lung with persistent bilateral perihilar and left basilar heterogeneous opacities.  No new focal airspace opacities.  The pulmonary vasculature remains indistinct with cephalization of flow.  No definite pleural effusion or pneumothorax.  Unchanged bones.  IMPRESSION: Grossly unchanged findings suggestive of mild pulmonary edema and scattered  bilateral atelectasis, but underlying infection not excluded.  A follow-up chest radiograph in 4 to 6 weeks after treatment is recommended to ensure resolution.   Original Report Authenticated By: Tacey Ruiz, MD    Anti-infectives: Anti-infectives   Start     Dose/Rate Route Frequency Ordered Stop   03/02/13 1700  cefOXitin (MEFOXIN) 1 g in dextrose 5 % 50 mL IVPB     1 g 100 mL/hr over 30 Minutes Intravenous Every 6 hours 03/02/13 1617 03/03/13 0527   03/02/13 0636  cefOXitin (MEFOXIN) 2 g in dextrose 5 % 50 mL IVPB     2 g 100 mL/hr over 30 Minutes Intravenous On call to O.R. 03/02/13 0636 03/02/13 0917      Assessment/Plan: s/p Procedure(s): LAPAROSCOPY DIAGNOSTIC LIVER ULTRASOUND OPEN PARTIAL HEPATECTOMY [83] Doing well.  Slightly tachy overnight with lower BP and uop- will restart some gentle hydration Increased JP output- dark brown in color, doesn't appear to be a bile leak.  May be ascites?  Will monitor Reg diet today since passing flatus better now Recheck cmet in AM   LOS: 5 days    Laurens Matheny C. 03/07/2013

## 2013-03-08 ENCOUNTER — Other Ambulatory Visit: Payer: Self-pay

## 2013-03-08 LAB — BASIC METABOLIC PANEL
BUN: 29 mg/dL — ABNORMAL HIGH (ref 6–23)
CO2: 24 mEq/L (ref 19–32)
Calcium: 9.1 mg/dL (ref 8.4–10.5)
Chloride: 100 mEq/L (ref 96–112)
Creatinine, Ser: 1.1 mg/dL (ref 0.50–1.35)
GFR calc Af Amer: 78 mL/min — ABNORMAL LOW (ref >90)
GFR calc non Af Amer: 67 mL/min — ABNORMAL LOW (ref >90)
Glucose, Bld: 150 mg/dL — ABNORMAL HIGH (ref 70–99)
Potassium: 3.7 mEq/L (ref 3.5–5.1)
Sodium: 137 mEq/L (ref 135–145)

## 2013-03-08 LAB — CBC WITH DIFFERENTIAL/PLATELET
Basophils Absolute: 0.1 10*3/uL (ref 0.0–0.1)
Basophils Relative: 1 % (ref 0–1)
Eosinophils Absolute: 0 10*3/uL (ref 0.0–0.7)
Eosinophils Relative: 0 % (ref 0–5)
HCT: 31.5 % — ABNORMAL LOW (ref 39.0–52.0)
Hemoglobin: 10.4 g/dL — ABNORMAL LOW (ref 13.0–17.0)
Lymphocytes Relative: 15 % (ref 12–46)
Lymphs Abs: 1.2 10*3/uL (ref 0.7–4.0)
MCH: 28.1 pg (ref 26.0–34.0)
MCHC: 33 g/dL (ref 30.0–36.0)
MCV: 85.1 fL (ref 78.0–100.0)
Monocytes Absolute: 1.2 10*3/uL — ABNORMAL HIGH (ref 0.1–1.0)
Monocytes Relative: 15 % — ABNORMAL HIGH (ref 3–12)
Neutro Abs: 5.3 10*3/uL (ref 1.7–7.7)
Neutrophils Relative %: 69 % (ref 43–77)
Platelets: 173 10*3/uL (ref 150–400)
RBC: 3.7 MIL/uL — ABNORMAL LOW (ref 4.22–5.81)
RDW: 15.5 % (ref 11.5–15.5)
WBC: 7.8 10*3/uL (ref 4.0–10.5)

## 2013-03-08 LAB — CBC
HCT: 34.3 % — ABNORMAL LOW (ref 39.0–52.0)
Hemoglobin: 11.1 g/dL — ABNORMAL LOW (ref 13.0–17.0)
MCH: 27.7 pg (ref 26.0–34.0)
MCHC: 32.4 g/dL (ref 30.0–36.0)
MCV: 85.5 fL (ref 78.0–100.0)
Platelets: 215 10*3/uL (ref 150–400)
RBC: 4.01 MIL/uL — ABNORMAL LOW (ref 4.22–5.81)
RDW: 15.5 % (ref 11.5–15.5)
WBC: 9.5 10*3/uL (ref 4.0–10.5)

## 2013-03-08 LAB — TROPONIN I
Troponin I: <0.3 ng/mL (ref <0.30)
Troponin I: <0.3 ng/mL (ref <0.30)

## 2013-03-08 LAB — COMPREHENSIVE METABOLIC PANEL
ALT: 123 U/L — ABNORMAL HIGH (ref 0–53)
AST: 34 U/L (ref 0–37)
Albumin: 3.2 g/dL — ABNORMAL LOW (ref 3.5–5.2)
Alkaline Phosphatase: 103 U/L (ref 39–117)
BUN: 31 mg/dL — ABNORMAL HIGH (ref 6–23)
CO2: 24 mEq/L (ref 19–32)
Calcium: 8.9 mg/dL (ref 8.4–10.5)
Chloride: 99 mEq/L (ref 96–112)
Creatinine, Ser: 1.15 mg/dL (ref 0.50–1.35)
GFR calc Af Amer: 74 mL/min — ABNORMAL LOW (ref >90)
GFR calc non Af Amer: 64 mL/min — ABNORMAL LOW (ref >90)
Glucose, Bld: 170 mg/dL — ABNORMAL HIGH (ref 70–99)
Potassium: 3.7 mEq/L (ref 3.5–5.1)
Sodium: 136 mEq/L (ref 135–145)
Total Bilirubin: 0.9 mg/dL (ref 0.3–1.2)
Total Protein: 6.9 g/dL (ref 6.0–8.3)

## 2013-03-08 LAB — GLUCOSE, CAPILLARY
Glucose-Capillary: 156 mg/dL — ABNORMAL HIGH (ref 70–99)
Glucose-Capillary: 155 mg/dL — ABNORMAL HIGH (ref 70–99)
Glucose-Capillary: 139 mg/dL — ABNORMAL HIGH (ref 70–99)
Glucose-Capillary: 126 mg/dL — ABNORMAL HIGH (ref 70–99)
Glucose-Capillary: 128 mg/dL — ABNORMAL HIGH (ref 70–99)

## 2013-03-08 MED ORDER — METOPROLOL TARTRATE 1 MG/ML IV SOLN
INTRAVENOUS | Status: AC
  Filled 2013-03-08: qty 5

## 2013-03-08 MED ORDER — METOPROLOL TARTRATE 25 MG PO TABS
25.0000 mg | ORAL_TABLET | Freq: Two times a day (BID) | ORAL | Status: DC
  Administered 2013-03-08 – 2013-03-09 (×3): 25 mg via ORAL
  Filled 2013-03-08 (×4): qty 1

## 2013-03-08 MED ORDER — METOPROLOL TARTRATE 1 MG/ML IV SOLN: 2.5000 mg | Freq: Four times a day (QID) | INTRAVENOUS | Status: DC | PRN

## 2013-03-08 MED ORDER — METOPROLOL TARTRATE 1 MG/ML IV SOLN
5.0000 mg | INTRAVENOUS | Status: DC | PRN
  Administered 2013-03-08: 5 mg via INTRAVENOUS

## 2013-03-08 NOTE — Progress Notes (Signed)
CCM notified RN patient's heart rate in the 170's, assessed patient who was sitting on edge of the bed asymptomatic, with no complaints. BP was 98/53, Resp 20. Patient sustained SVT for 3 min with heart rate in the 170's, where he returned to his normal in low 100's . He did sustain one more run of SVT for a shorter duration shortly thereafter. Contacted [facility] and spoke with Dr. Ezzard Standing, who ordered EKG and would round on patient this morning. Will continue to monitor patient. J.Oyuki Hogan, RN

## 2013-03-08 NOTE — Consult Note (Signed)
Reason for Consult: Recurrent paroxysmal SVT Referring Physician: Dr. Guinevere Ferrari is an 67 y.o. male.  HPI: Patient is 67 year old male with past medical history significant for coronary artery disease history of silent inferior wall myocardial infarction, hypertension, diabetes matters, obstructive sleep apnea, history of narcolepsy, history of hepatitis B, remote tobacco abuse, hepatocellular CA status post open partial hepatectomy, was noted to have recurrent episodes of SVT on the monitor with heart rate in one 70s to 190. Patient denies any palpitation lightheadedness or syncopal episode. Denies any chest pain or shortness of breath. Denies history of PND orthopnea leg swelling. Denies history of MI in the past. Patient spontaneously converted back into sinus rhythm EKG done post conversion showed no acute ischemic changes except for old inferior wall MI. Patient denies any cardiac workup in the past  Past Medical History  Diagnosis Date  . Diabetes mellitus without complication   . Hypertension   . Hyperlipidemia   . Cancer     hepatocellular cancer   . Hepatitis   . Narcolepsy     per office visit note of 08/2011   . Sleep apnea     Past Surgical History  Procedure Laterality Date  . Pinched nerve in back    . Laparoscopy  03/02/2013    Procedure: LAPAROSCOPY DIAGNOSTIC;  Surgeon: Almond Lint, MD;  Location: [facility];  Service: General;;  . Liver ultrasound  03/02/2013    Procedure: LIVER ULTRASOUND;  Surgeon: Almond Lint, MD;  Location: [facility];  Service: General;;  . Open partial hepatectomy [83]  03/02/2013    Procedure: OPEN PARTIAL HEPATECTOMY [83];  Surgeon: Almond Lint, MD;  Location: [facility];  Service: General;;  DX LAPAROSCOPY, INTRAOPERATIVE LIVER ULTRASOUND, OPEN PARTIAL HEPATECTOMY    Family History  Problem Relation Age of Onset  . Dementia Mother   . Cancer Father   . Diabetes Brother   . Hypertension Brother   . Cancer Brother     Social  History:  reports that he quit smoking about 2 years ago. He has never used smokeless tobacco. He reports that he does not drink alcohol or use illicit drugs.  Allergies:  Allergies  Allergen Reactions  . Ace Inhibitors Swelling    Angioedema - face.     Medications: I have reviewed the patient's current medications.  Results for orders placed during the hospital encounter of 03/02/13 (from the past 48 hour(s))  GLUCOSE, CAPILLARY     Status: Abnormal   Collection Time    03/06/13  4:38 PM      Result Value Range   Glucose-Capillary 175 (*) 70 - 99 mg/dL  GLUCOSE, CAPILLARY     Status: Abnormal   Collection Time    03/06/13  9:25 PM      Result Value Range   Glucose-Capillary 187 (*) 70 - 99 mg/dL   Comment 1 Notify RN    COMPREHENSIVE METABOLIC PANEL     Status: Abnormal   Collection Time    03/07/13  6:03 AM      Result Value Range   Sodium 133 (*) 135 - 145 mEq/L   Potassium 3.6  3.5 - 5.1 mEq/L   Comment: DELTA CHECK NOTED     REPEATED TO VERIFY     NO VISIBLE HEMOLYSIS   Chloride 94 (*) 96 - 112 mEq/L   CO2 26  19 - 32 mEq/L   Glucose, Bld 191 (*) 70 - 99 mg/dL   BUN 26 (*) 6 -  23 mg/dL   Comment: DELTA CHECK NOTED     REPEATED TO VERIFY   Creatinine, Ser 1.08  0.50 - 1.35 mg/dL   Calcium 9.7  8.4 - 40.9 mg/dL   Total Protein 7.3  6.0 - 8.3 g/dL   Albumin 3.4 (*) 3.5 - 5.2 g/dL   AST 40 (*) 0 - 37 U/L   ALT 174 (*) 0 - 53 U/L   Alkaline Phosphatase 113  39 - 117 U/L   Total Bilirubin 1.1  0.3 - 1.2 mg/dL   GFR calc non Af Amer 69 (*) >90 mL/min   GFR calc Af Amer 79 (*) >90 mL/min   Comment:            The eGFR has been calculated     using the CKD EPI equation.     This calculation has not been     validated in all clinical     situations.     eGFR's persistently     <90 mL/min signify     possible Chronic Kidney Disease.  CBC     Status: Abnormal   Collection Time    03/07/13  6:03 AM      Result Value Range   WBC 10.5  4.0 - 10.5 K/uL   RBC 3.84  (*) 4.22 - 5.81 MIL/uL   Hemoglobin 10.9 (*) 13.0 - 17.0 g/dL   HCT 81.1 (*) 91.4 - 78.2 %   MCV 85.2  78.0 - 100.0 fL   MCH 28.4  26.0 - 34.0 pg   MCHC 33.3  30.0 - 36.0 g/dL   RDW 95.6  21.3 - 08.6 %   Platelets 200  150 - 400 K/uL   Comment: REPEATED TO VERIFY     DELTA CHECK NOTED  MAGNESIUM     Status: None   Collection Time    03/07/13  6:03 AM      Result Value Range   Magnesium 2.2  1.5 - 2.5 mg/dL  PHOSPHORUS     Status: None   Collection Time    03/07/13  6:03 AM      Result Value Range   Phosphorus 4.4  2.3 - 4.6 mg/dL  GLUCOSE, CAPILLARY     Status: Abnormal   Collection Time    03/07/13  7:59 AM      Result Value Range   Glucose-Capillary 185 (*) 70 - 99 mg/dL  GLUCOSE, CAPILLARY     Status: Abnormal   Collection Time    03/07/13 11:39 AM      Result Value Range   Glucose-Capillary 147 (*) 70 - 99 mg/dL  GLUCOSE, CAPILLARY     Status: Abnormal   Collection Time    03/07/13  4:08 PM      Result Value Range   Glucose-Capillary 164 (*) 70 - 99 mg/dL  GLUCOSE, CAPILLARY     Status: Abnormal   Collection Time    03/07/13  9:31 PM      Result Value Range   Glucose-Capillary 142 (*) 70 - 99 mg/dL   Comment 1 Documented in Chart     Comment 2 Notify RN    COMPREHENSIVE METABOLIC PANEL     Status: Abnormal   Collection Time    03/08/13  5:00 AM      Result Value Range   Sodium 136  135 - 145 mEq/L   Potassium 3.7  3.5 - 5.1 mEq/L   Chloride 99  96 - 112 mEq/L   CO2  24  19 - 32 mEq/L   Glucose, Bld 170 (*) 70 - 99 mg/dL   BUN 31 (*) 6 - 23 mg/dL   Creatinine, Ser 9.81  0.50 - 1.35 mg/dL   Calcium 8.9  8.4 - 19.1 mg/dL   Total Protein 6.9  6.0 - 8.3 g/dL   Albumin 3.2 (*) 3.5 - 5.2 g/dL   AST 34  0 - 37 U/L   ALT 123 (*) 0 - 53 U/L   Alkaline Phosphatase 103  39 - 117 U/L   Total Bilirubin 0.9  0.3 - 1.2 mg/dL   GFR calc non Af Amer 64 (*) >90 mL/min   GFR calc Af Amer 74 (*) >90 mL/min   Comment:            The eGFR has been calculated     using the  CKD EPI equation.     This calculation has not been     validated in all clinical     situations.     eGFR's persistently     <90 mL/min signify     possible Chronic Kidney Disease.  CBC WITH DIFFERENTIAL     Status: Abnormal   Collection Time    03/08/13  5:00 AM      Result Value Range   WBC 7.8  4.0 - 10.5 K/uL   RBC 3.70 (*) 4.22 - 5.81 MIL/uL   Hemoglobin 10.4 (*) 13.0 - 17.0 g/dL   HCT 47.8 (*) 29.5 - 62.1 %   MCV 85.1  78.0 - 100.0 fL   MCH 28.1  26.0 - 34.0 pg   MCHC 33.0  30.0 - 36.0 g/dL   RDW 30.8  65.7 - 84.6 %   Platelets 173  150 - 400 K/uL   Neutrophils Relative % 69  43 - 77 %   Lymphocytes Relative 15  12 - 46 %   Monocytes Relative 15 (*) 3 - 12 %   Eosinophils Relative 0  0 - 5 %   Basophils Relative 1  0 - 1 %   Neutro Abs 5.3  1.7 - 7.7 K/uL   Lymphs Abs 1.2  0.7 - 4.0 K/uL   Monocytes Absolute 1.2 (*) 0.1 - 1.0 K/uL   Eosinophils Absolute 0.0  0.0 - 0.7 K/uL   Basophils Absolute 0.1  0.0 - 0.1 K/uL   WBC Morphology ATYPICAL LYMPHOCYTES    GLUCOSE, CAPILLARY     Status: Abnormal   Collection Time    03/08/13  7:15 AM      Result Value Range   Glucose-Capillary 156 (*) 70 - 99 mg/dL    No results found.  Review of Systems  Constitutional: Negative for fever and chills.  Respiratory: Negative for cough, hemoptysis and sputum production.   Cardiovascular: Negative for chest pain, palpitations, orthopnea and claudication.  Gastrointestinal: Negative for nausea, vomiting and abdominal pain.  Genitourinary: Negative for dysuria.  Neurological: Negative for dizziness.   Blood pressure 125/69, pulse 100, temperature 97.8 F (36.6 C), temperature source Oral, resp. rate 20, height 6' (1.829 m), weight 100 kg (220 lb 7.4 oz), SpO2 93.00%. Physical Exam  Constitutional: He is oriented to person, place, and time.  HENT:  Head: Normocephalic and atraumatic.  Eyes: Conjunctivae are normal. Pupils are equal, round, and reactive to light. Left eye exhibits  no discharge. No scleral icterus.  Neck: Normal range of motion. Neck supple. No JVD present. No tracheal deviation present. No thyromegaly present.  Cardiovascular: Normal rate  and regular rhythm.  Exam reveals no friction rub.   Murmur (Soft systolic murmur and S3 gallop noted) heard. Respiratory:  Decreased breath sound at bases with basilar rales  GI: Soft. Bowel sounds are normal.  Surgical clips/incision dry surgical dressing noted  Musculoskeletal: He exhibits no edema and no tenderness.  Neurological: He is alert and oriented to person, place, and time.    Assessment/Plan: Status post paroxysmal recurrent SVT Mild decompensated congestive heart failure secondary to volume overload/tachycardia induced  coronary artery disease history of silent MI in the past Hypertension Diabetes mellitus Hepatocellular CA STATUS post open partial hepatectomy History of hepatitis B Obstructive sleep apnea History of narcolepsy History of tobacco abuse Plan EKG daily x2 Check serial enzymes x3 Check 2-D echo DC Norvasc add Lopressor as per orders Avoid beta agonist if possible  Towanda Hornstein N 03/08/2013, 12:01 PM

## 2013-03-08 NOTE — Progress Notes (Signed)
General Surgery Note  LOS: 6 days  POD -   6 Days Post-Op  Assessment/Plan: 1.  Hepatocellular carcinoma - 4.8 cm, T1, Nx  LAPAROSCOPY DIAGNOSTIC, LIVER ULTRASOUND, OPEN PARTIAL HEPATECTOMY - 03/02/2013 - [facility]  Seems to be doing well.  Some bile staining of gauze around RUQ drain - though it is unclear to me how long the gauze has been on.  2.  DM  Glucose - 156 - 03/08/2013 3.  DVT prophylaxis - no chemoprophylaxis 4.  Hypertension 5.  OSA 6.  History of Hepatitis B 7.  SVT's early this AM  Rate hit the 170's, but his EKG (to me) appears unchanged.  He's had about 3 or 4 episodes of the SVT since 6 AM today.  He has been asymptomatic.   I spoke to Dr. Sharyn Lull about seeing the patient in consultation  Subjective:  Feels good.  Eating food without difficulty Objective:   Filed Vitals:   03/08/13 0406  BP: 125/69  Pulse: 100  Temp: 97.8 F (36.6 C)  Resp: 20     Intake/Output from previous day:  07/19 0701 - 07/20 0700 In: 1149.7 [P.O.:600; I.V.:549.7] Out: 60 [Drains:60]  Intake/Output this shift:  Total I/O In: -  Out: 70 [Drains:70]   Physical Exam:   General: Older AA M who is alert and oriented.    HEENT: Normal. Pupils equal. .   Lungs: Clear   Abdomen: Soft. Has BS.   Wound: Clean.   But there is some bile staining on the dressing around the drain.  70 cc recorded last 24 hours.   Lab Results:    Recent Labs  03/07/13 0603 03/08/13 0500  WBC 10.5 7.8  HGB 10.9* 10.4*  HCT 32.7* 31.5*  PLT 200 173    BMET   Recent Labs  03/07/13 0603 03/08/13 0500  NA 133* 136  K 3.6 3.7  CL 94* 99  CO2 26 24  GLUCOSE 191* 170*  BUN 26* 31*  CREATININE 1.08 1.15  CALCIUM 9.7 8.9    PT/INR  No results found for this basename: LABPROT, INR,  in the last 72 hours  ABG  No results found for this basename: PHART, PCO2, PO2, HCO3,  in the last 72 hours   Studies/Results:  No results found.   Anti-infectives:   Anti-infectives   Start     Dose/Rate  Route Frequency Ordered Stop   03/02/13 1700  cefOXitin (MEFOXIN) 1 g in dextrose 5 % 50 mL IVPB     1 g 100 mL/hr over 30 Minutes Intravenous Every 6 hours 03/02/13 1617 03/03/13 0527   03/02/13 0636  cefOXitin (MEFOXIN) 2 g in dextrose 5 % 50 mL IVPB     2 g 100 mL/hr over 30 Minutes Intravenous On call to O.R. 03/02/13 0636 03/02/13 1610      Ovidio Kin, MD, FACS Pager: [phone],   [facility] Office: [phone] 03/08/2013

## 2013-03-09 ENCOUNTER — Other Ambulatory Visit: Payer: Self-pay

## 2013-03-09 ENCOUNTER — Inpatient Hospital Stay (HOSPITAL_COMMUNITY): Payer: Medicare Other

## 2013-03-09 LAB — COMPREHENSIVE METABOLIC PANEL
ALT: 94 U/L — ABNORMAL HIGH (ref 0–53)
AST: 38 U/L — ABNORMAL HIGH (ref 0–37)
Albumin: 3 g/dL — ABNORMAL LOW (ref 3.5–5.2)
Alkaline Phosphatase: 123 U/L — ABNORMAL HIGH (ref 39–117)
BUN: 26 mg/dL — ABNORMAL HIGH (ref 6–23)
CO2: 22 mEq/L (ref 19–32)
Calcium: 8.9 mg/dL (ref 8.4–10.5)
Chloride: 100 mEq/L (ref 96–112)
Creatinine, Ser: 1.05 mg/dL (ref 0.50–1.35)
GFR calc Af Amer: 82 mL/min — ABNORMAL LOW (ref >90)
GFR calc non Af Amer: 71 mL/min — ABNORMAL LOW (ref >90)
Glucose, Bld: 119 mg/dL — ABNORMAL HIGH (ref 70–99)
Potassium: 3.8 mEq/L (ref 3.5–5.1)
Sodium: 134 mEq/L — ABNORMAL LOW (ref 135–145)
Total Bilirubin: 0.8 mg/dL (ref 0.3–1.2)
Total Protein: 6.7 g/dL (ref 6.0–8.3)

## 2013-03-09 LAB — GLUCOSE, CAPILLARY
Glucose-Capillary: 128 mg/dL — ABNORMAL HIGH (ref 70–99)
Glucose-Capillary: 141 mg/dL — ABNORMAL HIGH (ref 70–99)

## 2013-03-09 LAB — TROPONIN I: Troponin I: <0.3 ng/mL (ref <0.30)

## 2013-03-09 MED ORDER — METOPROLOL TARTRATE 25 MG PO TABS: 25.0000 mg | ORAL_TABLET | Freq: Two times a day (BID) | ORAL | Status: DC

## 2013-03-09 MED ORDER — TRAMADOL HCL 50 MG PO TABS: 50.0000 mg | ORAL_TABLET | Freq: Four times a day (QID) | ORAL | Status: DC | PRN

## 2013-03-09 NOTE — Discharge Summary (Signed)
Physician Discharge Summary  Patient ID: [name] MRN: [id number] DOB/AGE: [birth year] 67 y.o.  Admit date: 03/02/2013 Discharge date: 03/09/2013  Admission Diagnoses: Hepatitis B Hepatocellular cancer- principal diagnosis OSA ABL anemia (pre op) hypertension  Discharge Diagnoses:  Same Delirium, resolved.  Discharged Condition: stable  Hospital Course:  Pt was admitted from home for partial left hepatectomy for hepatocellular cancer.   He had been admitted previously after liver tumor hemorrhaged.  He underwent delayed biopsy and was found to have HCC.  He was scheduled for possible lap left hepatectomy. Upon diagnostic laparoscopy, the omentum was plastered to his tumor, and decision was made to open in order to perform safe operation.  He had partial left hepatectomy.  He was sent to the ICU post operatively.  PCCM consulted.  He did get blood intraoperatively, because he started out very anemic.  He had issues with blood pressure and urine output initially.  These improved on POD 3.  He did, however, become delirious, and he had some issues with hypoxia.  His narcotics were held, and he was placed on haldol.  Over the next few days, his mental status cleared, and his post op ileus resolved.  He had an episode of SVT, and cardiology (harwani) switched his norvasc to lopressor for rate control. This worked, and he was having bowel movements and tolerating diet.  He received echo as well, which was consistent with Dr. Annitta Jersey thoughts.    Consults: cardiology and pulmonary/intensive care  Significant Diagnostic Studies: labs: HCT prior to d/c 34.3, Cr. 1.05  Treatments: surgery: partial left hepatectomy  Discharge Exam: Blood pressure 126/53, pulse 78, temperature 97.8 F (36.6 C), temperature source Oral, resp. rate 18, height 6' (1.829 m), weight 220 lb 7.4 oz (100 kg), SpO2 96.00%. General appearance: alert, cooperative and no distress Resp: breathing  comfortably Cardio: regular rate and rhythm GI: soft, sl protuberant.  drain non bilious. no evidence of erythema or wound drainage Neurologic: Grossly normal  Disposition: 01-Home or Self Care  Discharge Orders   Future Appointments Provider Department Dept Phone   05/15/2013 8:30 AM Nilda Riggs, NP [facility] [phone]   Future Orders Complete By Expires     Call MD for:  difficulty breathing, headache or visual disturbances  As directed     Call MD for:  persistant nausea and vomiting  As directed     Call MD for:  redness, tenderness, or signs of infection (pain, swelling, redness, odor or green/yellow discharge around incision site)  As directed     Call MD for:  severe uncontrolled pain  As directed     Call MD for:  temperature >100.4  As directed     Diet - low sodium heart healthy  As directed     Discharge instructions  As directed     Comments:      Walk around at home at least 3 times/day.  NO LIFTING.    Increase activity slowly  As directed         Medication List    STOP taking these medications       amLODipine 10 MG tablet  Commonly known as:  NORVASC      TAKE these medications       alfuzosin 10 MG 24 hr tablet  Commonly known as:  UROXATRAL  Take 10 mg by mouth at bedtime.     amphetamine-dextroamphetamine 30 MG 24 hr capsule  Commonly known as:  ADDERALL XR  Take 30  mg by mouth every morning.     aspirin 81 MG tablet  Take 81 mg by mouth every morning.     hydrochlorothiazide 12.5 MG tablet  Commonly known as:  HYDRODIURIL  Take 12.5 mg by mouth every morning.     losartan 25 MG tablet  Commonly known as:  COZAAR  Take 25 mg by mouth every morning.     metFORMIN 1000 MG tablet  Commonly known as:  GLUCOPHAGE  Take 1 tablet (1,000 mg total) by mouth 2 (two) times daily with a meal.     metoprolol tartrate 25 MG tablet  Commonly known as:  LOPRESSOR  Take 1 tablet (25 mg total) by mouth 2 (two) times daily.      pravastatin 20 MG tablet  Commonly known as:  PRAVACHOL  Take 1 tablet (20 mg total) by mouth at bedtime.     spironolactone 25 MG tablet  Commonly known as:  ALDACTONE  Take 1 tablet (25 mg total) by mouth 2 (two) times daily.     traMADol 50 MG tablet  Commonly known as:  ULTRAM  Take 1 tablet (50 mg total) by mouth every 6 (six) hours as needed for pain.     XYREM PO  Take 4.5 mg by mouth at bedtime as needed (for sleep).         SignedAlmond Lint 03/09/2013, 8:58 AM

## 2013-03-09 NOTE — Progress Notes (Signed)
  Echocardiogram 2D Echocardiogram has been performed.  [name] 03/09/2013, 12:40 PM 

## 2013-03-09 NOTE — Progress Notes (Addendum)
Patient ID: [name], male   DOB: 1946-05-25, 67 y.o.   MRN: [id number] General Surgery Note  LOS: 7 days  POD -   7 Days Post-Op  Assessment/Plan: 1.  Hepatocellular carcinoma - 4.8 cm, T1, Nx  LAPAROSCOPY DIAGNOSTIC, LIVER ULTRASOUND, OPEN PARTIAL HEPATECTOMY - 03/02/2013 - F. Melaysia Streed  Seems to be doing well.  Tolerating diet.    2.  DM  Glucose - 156 - 03/08/2013 3.  DVT prophylaxis - scds.   4.  Hypertension 5.  OSA 6.  History of Hepatitis B 7.  SVT's yesterday.  None overnight.  Dr. Sharyn Lull switched from norvasc to lopressor.     HOME IF OK WITH DR. Sharyn Lull.   Subjective:  Feels good.  Eating food without difficulty Objective:   Filed Vitals:   03/09/13 0505  BP: 126/53  Pulse: 78  Temp: 97.8 F (36.6 C)  Resp: 18     Intake/Output from previous day:  07/20 0701 - 07/21 0700 In: 1946 [P.O.:120; I.V.:1825] Out: 430 [Urine:300; Drains:130]  Intake/Output this shift:  Total I/O In: -  Out: 40 [Drains:40]   Physical Exam:   General: Older AA M who is alert and oriented.    HEENT: Normal. Pupils equal. .   Lungs: Clear   Abdomen: Soft. Has BS.   Wound: Clean.   Drain without bilious output.    Lab Results:     Recent Labs  03/08/13 0500 03/08/13 1348  WBC 7.8 9.5  HGB 10.4* 11.1*  HCT 31.5* 34.3*  PLT 173 215    BMET    Recent Labs  03/08/13 1348 03/09/13 0100  NA 137 134*  K 3.7 3.8  CL 100 100  CO2 24 22  GLUCOSE 150* 119*  BUN 29* 26*  CREATININE 1.10 1.05  CALCIUM 9.1 8.9    PT/INR  No results found for this basename: LABPROT, INR,  in the last 72 hours  ABG  No results found for this basename: PHART, PCO2, PO2, HCO3,  in the last 72 hours   Studies/Results:  Dg Chest 2 View  03/09/2013   *RADIOLOGY REPORT*  Clinical Data: Status post partial hepatectomy.  Follow up of CHF.  CHEST - 2 VIEW  Comparison: 03/07/2003.  Findings: Surgical changes in the right upper quadrant. Midline trachea.  Normal heart size with  atherosclerosis in the transverse aorta. No pleural effusion or pneumothorax.  Improved aeration with scarring at the left lung base.  Minimal subsegmental atelectasis at the lung bases, greater left than right.  IMPRESSION: No acute cardiopulmonary disease.  Improved bibasilar aeration with residual scarring and subsegmental atelectasis.   Original Report Authenticated By: Jeronimo Greaves, M.D.     Anti-infectives:   Anti-infectives   Start     Dose/Rate Route Frequency Ordered Stop   03/02/13 1700  cefOXitin (MEFOXIN) 1 g in dextrose 5 % 50 mL IVPB     1 g 100 mL/hr over 30 Minutes Intravenous Every 6 hours 03/02/13 1617 03/03/13 0527   03/02/13 0636  cefOXitin (MEFOXIN) 2 g in dextrose 5 % 50 mL IVPB     2 g 100 mL/hr over 30 Minutes Intravenous On call to O.R. 03/02/13 4098 03/02/13 1191      03/09/2013

## 2013-03-09 NOTE — Progress Notes (Signed)
Subjective:  Patient denies any chest pain or shortness of breath. Denies any palpitations. No further episodes of SVT occasional PVCs on the monitor. 2-D echo is still not in epic system.  Objective:  Vital Signs in the last 24 hours: Temp:  [97.8 F (36.6 C)-98.6 F (37 C)] 97.8 F (36.6 C) (07/21 0505) Pulse Rate:  [78-85] 78 (07/21 0505) Resp:  [15-18] 18 (07/21 0505) BP: (121-126)/(53-70) 126/53 mmHg (07/21 0505) SpO2:  [95 %-97 %] 96 % (07/21 0812) FiO2 (%):  [21 %] 21 % (07/21 0812)  Intake/Output from previous day: 07/20 0701 - 07/21 0700 In: 1946 [P.O.:120; I.V.:1825] Out: 430 [Urine:300; Drains:130] Intake/Output from this shift: Total I/O In: -  Out: 40 [Drains:40]  Physical Exam: Neck: no adenopathy, no carotid bruit, no JVD and supple, symmetrical, trachea midline Lungs: Decreased breath sound at bases air entry improved Heart: regular rate and rhythm, S1, S2 normal and Soft systolic murmur noted no S3 gallop Abdomen: Soft dorsalis present surgical incision dry Extremities: extremities normal, atraumatic, no cyanosis or edema  Lab Results:  Recent Labs  03/08/13 0500 03/08/13 1348  WBC 7.8 9.5  HGB 10.4* 11.1*  PLT 173 215    Recent Labs  03/08/13 1348 03/09/13 0100  NA 137 134*  K 3.7 3.8  CL 100 100  CO2 24 22  GLUCOSE 150* 119*  BUN 29* 26*  CREATININE 1.10 1.05    Recent Labs  03/08/13 1807 03/09/13 0100  TROPONINI <0.30 <0.30   Hepatic Function Panel  Recent Labs  03/09/13 0100  PROT 6.7  ALBUMIN 3.0*  AST 38*  ALT 94*  ALKPHOS 123*  BILITOT 0.8   No results found for this basename: CHOL,  in the last 72 hours No results found for this basename: PROTIME,  in the last 72 hours  Imaging: Imaging results have been reviewed and Dg Chest 2 View  03/09/2013   *RADIOLOGY REPORT*  Clinical Data: Status post partial hepatectomy.  Follow up of CHF.  CHEST - 2 VIEW  Comparison: 03/07/2003.  Findings: Surgical changes in the right  upper quadrant. Midline trachea.  Normal heart size with atherosclerosis in the transverse aorta. No pleural effusion or pneumothorax.  Improved aeration with scarring at the left lung base.  Minimal subsegmental atelectasis at the lung bases, greater left than right.  IMPRESSION: No acute cardiopulmonary disease.  Improved bibasilar aeration with residual scarring and subsegmental atelectasis.   Original Report Authenticated By: Jeronimo Greaves, M.D.    Cardiac Studies:  Assessment/Plan:  Status post paroxysmal recurrent SVT  compensated congestive heart failure secondary to volume overload/tachycardia induced  coronary artery disease history of silent MI in the past  Hypertension  Diabetes mellitus  Hepatocellular CA STATUS post open partial hepatectomy  History of hepatitis B  Obstructive sleep apnea  History of narcolepsy  History of tobacco abuse  Plan Continue present management Check 2-D echo Okay to discharge from cardiac point of view Followup with me in 2 weeks  LOS: 7 days    Neila Teem N 03/09/2013, 12:41 PM

## 2013-03-10 LAB — GLUCOSE, CAPILLARY: Glucose-Capillary: 205 mg/dL — ABNORMAL HIGH (ref 70–99)

## 2013-03-16 ENCOUNTER — Ambulatory Visit (INDEPENDENT_AMBULATORY_CARE_PROVIDER_SITE_OTHER): Payer: Medicare Other | Admitting: Family Medicine

## 2013-03-16 VITALS — BP 100/52 | HR 96 | Temp 98.6°F | Resp 17 | Ht 70.0 in | Wt 198.0 lb

## 2013-03-16 DIAGNOSIS — E119 Type 2 diabetes mellitus without complications: Secondary | ICD-10-CM

## 2013-03-16 DIAGNOSIS — I471 Supraventricular tachycardia: Secondary | ICD-10-CM

## 2013-03-16 DIAGNOSIS — I498 Other specified cardiac arrhythmias: Secondary | ICD-10-CM

## 2013-03-16 DIAGNOSIS — R9431 Abnormal electrocardiogram [ECG] [EKG]: Secondary | ICD-10-CM

## 2013-03-16 NOTE — Progress Notes (Signed)
Subjective:    Patient ID: [name], male    DOB: September 27, 1945, 67 y.o.   MRN: [id number]  HPI [name] is a 67 y.o. male  Hospitalized 03/02/13- 03/09/13  - in review of discharge summary:   partial left hepatectomy for hepatocellular cancer. S/p open partial left hepatectomy. He was sent to the ICU post operatively. PCCM consulted. He did get blood intraoperatively, because he started out very anemic. He had issues with blood pressure and urine output initially. These improved on POD 3. He did, however, become delirious, and he had some issues with hypoxia. His narcotics were held, and he was placed on haldol. Over the next few days, his mental status cleared, and his post op ileus resolved. He had an episode of SVT, and cardiology (harwani) switched his norvasc to lopressor for rate control. This worked, and he was having bowel movements and tolerating diet. He received echo as well.    In review of Dr. Annitta Jersey consult - recurrent SVT on monitor - spontaneous conversion. 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 Dx: paroxysmal recurrent SVT. Mild decompensated CHF secondary to volume overload. Changed norvasc to lopressor. Echo 03/09/13: Left ventricle: The cavity size was normal. Systolic function was normal. The estimated ejection fraction was in the range of 50% to 55%. Possible moderate hypokinesis of the basalinferior myocardium.   Here to discuss questions - did not know he had heart attack prior, does not remember having any symptoms prior, no chest pains.   Also was supposed to follow up with heart doctor but not sure of details of this. No chest pains or heart racing since leaving hospital. No fever.  Plans on calling surgeon tomorrow for routine post op follow up.   Taking meds from hospital d/c ok. Blood sugar around 140.  Still taking ASA 81mg  qd.    Review of Systems  Constitutional: Negative for fever and chills.  Respiratory: Negative for cough, chest tightness and shortness  of breath.   Cardiovascular: Negative for chest pain, palpitations and leg swelling.  Neurological: Negative for dizziness, light-headedness and headaches.       Objective:   Physical Exam  Vitals reviewed. Constitutional: He is oriented to person, place, and time. He appears well-developed and well-nourished.  HENT:  Head: Normocephalic and atraumatic.  Eyes: EOM are normal. Pupils are equal, round, and reactive to light.  Neck: No JVD present. Carotid bruit is not present.  Cardiovascular: Normal rate, regular rhythm and normal heart sounds.   No murmur heard. Pulmonary/Chest: Effort normal and breath sounds normal. He has no rales.  Musculoskeletal: He exhibits no edema.  Neurological: He is alert and oriented to person, place, and time.  Skin: Skin is warm and dry.  Psychiatric: He has a normal mood and affect.       Assessment & Plan:  [name] is a 67 y.o. male Nonspecific abnormal electrocardiogram (ECG) (EKG) - Plan: Ambulatory referral to Cardiology.  Reviewed hospital EKG's - q in III, but also noted in AVF, not seen on prior EKG in 2012. Suspected silent inferior wall MI unknown timing as asymptomatic.   Will refer to cardiology for further eval and necessary testing as needed, continue ASA, betablocker, and will work on DM control for secondary prevention. Er/911 precautions discussed if any palpitations or chest pain. Questions answered form patient and significant other with him during ov.   SVT (supraventricular tachycardia) - Plan: Ambulatory referral to Cardiology. asx currently. Precautions above. Continue lopressor.  DM2 (diabetes mellitus, type 2) - decreased control in hospital and noted elevated A1c at 08/2012 visit. (Aic 6.8 to 9.5 since 06/02/12). Some decrease in diet control, and few missed metformin doses then, but added glipizide 2.5mg  qd.  Elevated in hospital, will check outside readings for the next few weeks, and plan on A1c at next ov.    S/p partial left hepatectomy for hepatocellular cancer.  Has scheduled follow up with surgeon.  Rtc/er precautions.    Patient Instructions  If any chest pain or racing of your heart - go to the emergency room or call 911.  We will refer you to cardiologist.  Follow up with your surgeon as planned.  Keep a record of your blood sugars outside of the office and bring them to the next office visit. Follow up in the next few weeks.

## 2013-03-16 NOTE — Patient Instructions (Addendum)
If any chest pain or racing of your heart - go to the emergency room or call 911.  We will refer you to cardiologist.  Follow up with your surgeon as planned.  Keep a record of your blood sugars outside of the office and bring them to the next office visit. Follow up in the next few weeks.

## 2013-03-24 ENCOUNTER — Encounter (INDEPENDENT_AMBULATORY_CARE_PROVIDER_SITE_OTHER): Payer: Self-pay | Admitting: General Surgery

## 2013-03-24 ENCOUNTER — Ambulatory Visit (INDEPENDENT_AMBULATORY_CARE_PROVIDER_SITE_OTHER): Payer: Medicare Other | Admitting: General Surgery

## 2013-03-24 VITALS — BP 106/60 | HR 68 | Resp 16 | Ht 69.0 in | Wt 199.8 lb

## 2013-03-24 DIAGNOSIS — C228 Malignant neoplasm of liver, primary, unspecified as to type: Secondary | ICD-10-CM

## 2013-03-24 DIAGNOSIS — C220 Liver cell carcinoma: Secondary | ICD-10-CM

## 2013-03-24 NOTE — Patient Instructions (Signed)
Follow up in 3 months

## 2013-03-24 NOTE — Assessment & Plan Note (Signed)
Patient has no evidence of surgical complication.  I will see him back in approximately 3 months.  I would repeat his AFP in 3 months and 6 months and base decisions for scans on this value.

## 2013-03-24 NOTE — Progress Notes (Signed)
HISTORY: Patient is a 67 year old male who is now approximately 3 weeks status post partial left hepatectomy for hepatocellular cancer.  He had no evidence of metastatic disease found at exploration despite the fact that he had tumor rupture preoperatively. It appeared that his omentum had walled off the left hepatic lobe and prevented widespread dissemination of the cancer in his abdominal cavity. He did well postoperatively. He was very confused and delirious for around 2-3 days, but once his sensorium cleared, he advanced very rapidly.  He had a brief episode of tachycardia that resolved with metoprolol. He was stable for discharge and went home. Since going home his only complaint has been that his energy level is not back up to normal. He is not taking any narcotics. He denies issues with constipation. He has not had any jaundice.    EXAM: General:  Alert and oriented Incision:  Healing well.  No evidence of infection or dehiscence.     PATHOLOGY: Diagnosis 1. Omentum, resection for tumor - BENIGN OMENTAL FIBROVASCULAR AND ADIPOSE SOFT TISSUE. - NEGATIVE FOR MALIGNANCY. 2. Liver, partial resection, left - HEPATOCELLULAR CARCINOMA (4.8 CM), SEE COMMENT. - TUMOR IS 1.8 CM FROM NEAREST SURGICAL MARGIN (INFERIOR / CENTRAL). - NO LYMPHOVASCULAR INVASION IS IDENTIFIED. - SEE TUMOR TEMPLATE BELOW.   ASSESSMENT AND PLAN:   Cancer, hepatocellular Patient has no evidence of surgical complication.  I will see him back in approximately 3 months.  I would repeat his AFP in 3 months and 6 months and base decisions for scans on this value.      Maudry Diego, MD Surgical Oncology, General & Endocrine Surgery [facility]  Shade Flood, MD No ref. provider found

## 2013-03-25 ENCOUNTER — Other Ambulatory Visit: Payer: Self-pay | Admitting: Family Medicine

## 2013-05-05 ENCOUNTER — Telehealth (INDEPENDENT_AMBULATORY_CARE_PROVIDER_SITE_OTHER): Payer: Self-pay

## 2013-05-05 ENCOUNTER — Other Ambulatory Visit (INDEPENDENT_AMBULATORY_CARE_PROVIDER_SITE_OTHER): Payer: Self-pay | Admitting: General Surgery

## 2013-05-05 DIAGNOSIS — C220 Liver cell carcinoma: Secondary | ICD-10-CM

## 2013-05-05 NOTE — Telephone Encounter (Signed)
Pt notified of his appointment.  Also reminded to go by [facility] to have labs drawn several days prior to his appointment.  Pt voiced understanding.

## 2013-05-08 ENCOUNTER — Other Ambulatory Visit: Payer: Self-pay | Admitting: Neurology

## 2013-05-08 DIAGNOSIS — G47411 Narcolepsy with cataplexy: Secondary | ICD-10-CM

## 2013-05-08 MED ORDER — AMPHETAMINE-DEXTROAMPHET ER 30 MG PO CP24: 30.0000 mg | ORAL_CAPSULE | Freq: Every morning | ORAL | Status: DC

## 2013-05-15 ENCOUNTER — Encounter: Payer: Self-pay | Admitting: Neurology

## 2013-05-15 ENCOUNTER — Ambulatory Visit (INDEPENDENT_AMBULATORY_CARE_PROVIDER_SITE_OTHER): Payer: Medicare Other | Admitting: Neurology

## 2013-05-15 VITALS — BP 134/71 | HR 76 | Ht 69.5 in | Wt 212.0 lb

## 2013-05-15 DIAGNOSIS — G47419 Narcolepsy without cataplexy: Secondary | ICD-10-CM

## 2013-05-15 DIAGNOSIS — G4733 Obstructive sleep apnea (adult) (pediatric): Secondary | ICD-10-CM

## 2013-05-15 NOTE — Progress Notes (Deleted)
Subjective:    Patient ID: [name] is a 67 y.o. male.  HPI {Common ambulatory SmartLinks:[id number]}  Review of Systems  All other systems reviewed and are negative.    Objective:  Neurologic Exam  Physical Exam  Assessment:   ***  Plan:   ***

## 2013-05-15 NOTE — Patient Instructions (Signed)
Re-start using your CPAP regularly. While your insurance requires that you use CPAP at least 4 hours each night on 70% of the nights, I recommend, that you not skip any nights and use it throughout the night if you can. Getting used to CPAP does take time and patience and discipline. Untreated obstructive sleep apnea when it is moderate to severe can have an adverse impact on cardiovascular health and raise her risk for heart disease, arrhythmias, hypertension, congestive heart failure, stroke and diabetes. Untreated obstructive sleep apnea causes sleep disruption, nonrestorative sleep, and sleep deprivation. This can have an impact on your day to day functioning and cause daytime sleepiness and impairment of cognitive function, memory loss, mood disturbance, and problems focussing. Using CPAP regularly can improve these symptoms.  Continue meds for your narcolepsy. Please follow up with Dr. Vickey Huger.

## 2013-05-15 NOTE — Progress Notes (Signed)
Subjective:    Patient ID: [name] is a 67 y.o. male.  HPI   Interim history:    [name] is a very pleasant 67 y.o.-year-old Right-handed male, with an underlying medical history of hypertension, hyperlipidemia, diabetes, who presents for followup consultation of his narcolepsy as well as sleep apnea. The patient is unaccompanied today. I am seeing this patient on behalf of my colleague Dr. Vickey Huger. She last saw the patient on 06/11/2012 at which time he indicated that he no longer uses a CPAP machine. He has sleep study in 2012 which showed moderate obstructive sleep apnea and was titrated on CPAP of 9 cm of water pressure. He had residual sleep apnea findings without pressure and she increased his pressure to 10 cm which improved his residual AHI. While he was compliant with CPAP at the time he had problems with co-pays and indicated that he could not afford CPAP. At some point he returned a CPAP machine to his home health company. He is willing to get restarted on treatment. He has a history of narcolepsy and indicates no recent cataplectic attacks. He was tried on Provigil which did not help her sleepiness. He is currently on Xyrem 4.5 g and 3 g each night and Adderall XR 30 mg daily. He recently received refills on those medications and does not require any refills today. He indicates no new problems. He feels that his medication regimen is working well for him.   His Past Medical History Is Significant For: Past Medical History  Diagnosis Date  . Diabetes mellitus without complication   . Hypertension   . Hyperlipidemia   . Cancer     hepatocellular cancer   . Hepatitis   . Narcolepsy     per office visit note of 08/2011   . Sleep apnea     His Past Surgical History Is Significant For: Past Surgical History  Procedure Laterality Date  . Pinched nerve in back    . Laparoscopy  03/02/2013    Procedure: LAPAROSCOPY DIAGNOSTIC;  Surgeon: Almond Lint, MD;   Location: [facility];  Service: General;;  . Liver ultrasound  03/02/2013    Procedure: LIVER ULTRASOUND;  Surgeon: Almond Lint, MD;  Location: [facility];  Service: General;;  . Open partial hepatectomy [83]  03/02/2013    Procedure: OPEN PARTIAL HEPATECTOMY [83];  Surgeon: Almond Lint, MD;  Location: [facility];  Service: General;;  DX LAPAROSCOPY, INTRAOPERATIVE LIVER ULTRASOUND, OPEN PARTIAL HEPATECTOMY    His Family History Is Significant For: Family History  Problem Relation Age of Onset  . Dementia Mother   . Cancer Father   . Diabetes Brother   . Hypertension Brother   . Cancer Brother     His Social History Is Significant For: History   Social History  . Marital Status: Single    Spouse Name: N/A    Number of Children: N/A  . Years of Education: N/A   Social History Main Topics  . Smoking status: Former Smoker    Quit date: 02/18/2011  . Smokeless tobacco: Never Used  . Alcohol Use: No  . Drug Use: No  . Sexual Activity: Yes   Other Topics Concern  . None   Social History Narrative  . None    His Allergies Are:  Allergies  Allergen Reactions  . Ace Inhibitors Swelling    Angioedema - face.   :   His Current Medications Are:  Outpatient Encounter Prescriptions as of 05/15/2013  Medication Sig  Dispense Refill  . alfuzosin (UROXATRAL) 10 MG 24 hr tablet Take 10 mg by mouth at bedtime.       [name] Kitchen amLODipine (NORVASC) 10 MG tablet TAKE ONE TABLET BY MOUTH EVERY DAY  30 tablet  0  . amphetamine-dextroamphetamine (ADDERALL XR) 30 MG 24 hr capsule Take 1 capsule (30 mg total) by mouth every morning.  30 capsule  0  . aspirin 81 MG tablet Take 81 mg by mouth every morning.       . hydrochlorothiazide (HYDRODIURIL) 12.5 MG tablet Take 12.5 mg by mouth every morning.      [name] Kitchen losartan (COZAAR) 25 MG tablet Take 25 mg by mouth every morning.      . metFORMIN (GLUCOPHAGE) 1000 MG tablet Take 1 tablet (1,000 mg total) by mouth 2 (two) times daily with a meal.  180 tablet  1  .  metoprolol succinate (TOPROL-XL) 50 MG 24 hr tablet       . metoprolol tartrate (LOPRESSOR) 25 MG tablet Take 1 tablet (25 mg total) by mouth 2 (two) times daily.  60 tablet  1  . pravastatin (PRAVACHOL) 20 MG tablet Take 1 tablet (20 mg total) by mouth at bedtime.  90 tablet  1  . Sodium Oxybate (XYREM PO) Take 4.5 mg by mouth at bedtime as needed (for sleep).      [name] Kitchen spironolactone (ALDACTONE) 25 MG tablet Take 1 tablet (25 mg total) by mouth 2 (two) times daily.  180 tablet  1  . traMADol (ULTRAM) 50 MG tablet Take 1 tablet (50 mg total) by mouth every 6 (six) hours as needed for pain.  30 tablet  1   No facility-administered encounter medications on file as of 05/15/2013.  : Review of Systems  All other systems reviewed and are negative.    Objective:  Neurologic Exam  Physical Exam Physical Examination:   Filed Vitals:   05/15/13 0958  BP: 134/71  Pulse: 76    General Examination: The patient is a very pleasant 67 y.o. male in no acute distress. He appears well-developed and well-nourished and well groomed.   HEENT: Normocephalic, atraumatic, pupils are equal, round and reactive to light and accommodation. Extraocular tracking is good without limitation to gaze excursion or nystagmus noted. Normal smooth pursuit is noted. Hearing is impaired. Face is symmetric with normal facial animation and normal facial sensation. Speech is clear with no dysarthria noted. There is no hypophonia. There is no lip, neck/head, jaw or voice tremor. Neck is supple with full range of passive and active motion. There are no carotid bruits on auscultation. Oropharynx exam reveals: mild mouth dryness, adequate dental hygiene with several missing teeth and moderate airway crowding. Mallampati is class II. Tongue protrudes centrally and palate elevates symmetrically.   Chest: Clear to auscultation without wheezing, rhonchi or crackles noted.  Heart: S1+S2+0, regular and normal without murmurs, rubs or  gallops noted.   Abdomen: Soft, non-tender and non-distended with normal bowel sounds appreciated on auscultation.  Extremities: There is no pitting edema in the distal lower extremities bilaterally. Pedal pulses are intact.  Skin: Warm and dry without trophic changes noted. There are no varicose veins.  Musculoskeletal: exam reveals no obvious joint deformities, tenderness or joint swelling or erythema.   Neurologically:  Mental status: The patient is awake, alert and oriented in all 4 spheres. His memory, attention, language and knowledge are appropriate. There is no aphasia, agnosia, apraxia or anomia. Speech is clear with normal prosody and enunciation. Thought process is  linear. Mood is congruent and affect is normal.  Cranial nerves are as described above under HEENT exam. In addition, shoulder shrug is normal with equal shoulder height noted. Motor exam: Normal bulk, strength and tone is noted. There is no drift, tremor or rebound. Romberg is negative. Reflexes are 1+ in the upper extremities and absent in the lower extremities. Fine motor skills are intact with normal finger taps, normal hand movements, normal rapid alternating patting, normal foot taps and normal foot agility.  Cerebellar testing shows no dysmetria or intention tremor on finger to nose testing. Heel to shin is unremarkable bilaterally. There is no truncal or gait ataxia.  Sensory exam is intact to light touch, pinprick, vibration, temperature sense in the upper extremities and slightly decreased vibration sense in the distal lower extremities.  Gait, station and balance are unremarkable. No veering to one side is noted. No leaning to one side is noted. Posture is age-appropriate and stance is narrow based. No problems turning are noted. He turns en bloc. Tandem walk is difficult for him.               Assessment and Plan:   In summary, NICKLOUS ABURTO is a very pleasant 67 y.o.-year old male with a history of  narcolepsy and obstructive sleep apnea. He is currently not on CPAP therapy but would certainly benefit. I have counseled him at length today about the importance of being compliant with CPAP therapy and try to get back on track with treatment. He indicated that he would be willing to get back on CPAP therapy. To that end, I have ordered another CPAP treatment unit for him and we will fax this to his existing DME company[facility]. As far as his narcolepsy goes he is well-maintained without side effects on his current medication regimen which includes Xyrem 4.5 and 3 g each night and Adderall long-acting 30 mg once daily. He recently received refills and did not need any refills today. Since he is stable he can followup in 6 months. I have asked him to make an appointment with Dr. Vickey Huger and encouraged him to call with any interim questions, concerns, problems or updates and refill requests. He was in agreement.

## 2013-05-19 ENCOUNTER — Telehealth: Payer: Self-pay | Admitting: *Deleted

## 2013-05-19 NOTE — Telephone Encounter (Signed)
error 

## 2013-05-22 ENCOUNTER — Ambulatory Visit (INDEPENDENT_AMBULATORY_CARE_PROVIDER_SITE_OTHER): Payer: Medicare Other | Admitting: General Surgery

## 2013-06-01 ENCOUNTER — Encounter: Payer: Self-pay | Admitting: Family Medicine

## 2013-06-01 ENCOUNTER — Ambulatory Visit (INDEPENDENT_AMBULATORY_CARE_PROVIDER_SITE_OTHER): Payer: Medicare Other | Admitting: Family Medicine

## 2013-06-01 VITALS — BP 122/70 | HR 76 | Temp 98.0°F | Resp 16 | Ht 69.5 in | Wt 208.4 lb

## 2013-06-01 DIAGNOSIS — E119 Type 2 diabetes mellitus without complications: Secondary | ICD-10-CM

## 2013-06-01 DIAGNOSIS — R9431 Abnormal electrocardiogram [ECG] [EKG]: Secondary | ICD-10-CM

## 2013-06-01 DIAGNOSIS — I10 Essential (primary) hypertension: Secondary | ICD-10-CM

## 2013-06-01 DIAGNOSIS — C228 Malignant neoplasm of liver, primary, unspecified as to type: Secondary | ICD-10-CM

## 2013-06-01 DIAGNOSIS — Z23 Encounter for immunization: Secondary | ICD-10-CM

## 2013-06-01 DIAGNOSIS — C220 Liver cell carcinoma: Secondary | ICD-10-CM

## 2013-06-01 LAB — COMPREHENSIVE METABOLIC PANEL
ALT: 21 U/L (ref 0–53)
AST: 26 U/L (ref 0–37)
Albumin: 3.8 g/dL (ref 3.5–5.2)
Alkaline Phosphatase: 65 U/L (ref 39–117)
BUN: 12 mg/dL (ref 6–23)
CO2: 25 mEq/L (ref 19–32)
Calcium: 9.3 mg/dL (ref 8.4–10.5)
Chloride: 108 mEq/L (ref 96–112)
Creat: 1.19 mg/dL (ref 0.50–1.35)
Glucose, Bld: 127 mg/dL — ABNORMAL HIGH (ref 70–99)
Potassium: 4.2 mEq/L (ref 3.5–5.3)
Sodium: 140 mEq/L (ref 135–145)
Total Bilirubin: 0.5 mg/dL (ref 0.3–1.2)
Total Protein: 7.4 g/dL (ref 6.0–8.3)

## 2013-06-01 LAB — GLUCOSE, POCT (MANUAL RESULT ENTRY): POC Glucose: 153 mg/dl — AB (ref 70–99)

## 2013-06-01 LAB — POCT GLYCOSYLATED HEMOGLOBIN (HGB A1C): Hemoglobin A1C: 5.9

## 2013-06-01 MED ORDER — METOPROLOL SUCCINATE ER 50 MG PO TB24: 50.0000 mg | ORAL_TABLET | Freq: Every day | ORAL | Status: DC

## 2013-06-01 MED ORDER — LOSARTAN POTASSIUM 25 MG PO TABS: 25.0000 mg | ORAL_TABLET | Freq: Every day | ORAL | Status: DC

## 2013-06-01 NOTE — Patient Instructions (Signed)
Based on review of your medicines and the notes from cardiology, it appears you are not supposed to be on amlodipine, so I discontinued this. Keep a record of your blood pressures outside of the office and bring them to the next office visit, but if running above 140/90 - return to discuss medicines. Diabetes at goal. Continue metformin twice per day. recheck in 3 months.

## 2013-06-01 NOTE — Progress Notes (Signed)
Subjective:    Patient ID: [name], male    DOB: 07-28-1946, 67 y.o.   MRN: [id number]  HPI NICHOALS [name] is a 67 y.o. male Hx of HTN, Dm2, s/p partial left left hepatectomy for hepatocellular cancer in July of this year.  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 Sharyn Lull, and paroxysmal recurrent SVT. Has follow up with Dr. Donell Beers this Thursday.   PSVT, old inferior wall MI - cardiology switched his norvasc to lopressor for rate control in hospital.  Referred to cardiology at last ov. Continued on BB, ASA.  Now followed by Dr.  Sharyn Lull. Seen few weeks after last visit with me. Changed to 1/2 of Cozaar 25mg  qd, Home readings: 125-35/70's.  DM2 - elevated glucose/decreased control in hospital. Aic form 6.8 in 05/2012 to 9.5 earlier this year.  Plan at last ov for home readings to bring in and Aic today.  No recent blood sugars, but prior blood sugars up to 180, but usually 130-150's.  No symptomatic lows.  Lowest 70 a month ago. Metformin 1000mg  BID. Prior on glipizide 2.5mg  QD - not sure if he has been taking this - not filled recently. Just metformin.   Htn - see above., Echo 03/09/13: Left ventricle: The cavity size was normal. Systolic function was normal. The estimated ejection fraction was in the range of 50% to 55%. Possible moderate hypokinesis of the basalinferior myocardium. No chest pain. No lightheadedness. Taking norvasc 5mg  (1/2 of 10mg  qd), toprol xl 50 mg qd, spirinolactone 25mg  BID, cozaar 25mg  qd. Per cariology notes should be off norvasc.   Hx of hepatocellular carcinoma, s/p partial left hepatectomy. Scheduled to follow up with Dr. Donell Beers in 2 days.  Still feels a little swelling around abdomen, but just occasional fleeting pain.    Review of Systems  Constitutional: Negative for fatigue and unexpected weight change.  Eyes: Negative for visual disturbance.  Respiratory: Negative for cough, chest tightness and shortness of breath.   Cardiovascular:  Negative for chest pain, palpitations and leg swelling.  Gastrointestinal: Negative for abdominal pain (occasional in lower abdomen from where had surgery. ) and blood in stool.  Neurological: Negative for dizziness, light-headedness and headaches.       Objective:   Physical Exam  Vitals reviewed. Constitutional: He is oriented to person, place, and time. He appears well-developed and well-nourished.  HENT:  Head: Normocephalic and atraumatic.  Eyes: Pupils are equal, round, and reactive to light.  Cardiovascular: Normal rate, regular rhythm, normal heart sounds and intact distal pulses.   Pulmonary/Chest: Effort normal and breath sounds normal.  Abdominal: Soft.  Neurological: He is alert and oriented to person, place, and time.  Microfilament testing of feet normal bilaterally.  Skin: Skin is warm, dry and intact. No rash noted.  Psychiatric: He has a normal mood and affect. His behavior is normal.    Results for orders placed in visit on 06/01/13  GLUCOSE, POCT (MANUAL RESULT ENTRY)      Result Value Range   POC Glucose 153 (*) 70 - 99 mg/dl  POCT GLYCOSYLATED HEMOGLOBIN (HGB A1C)      Result Value Range   Hemoglobin A1C 5.9        Assessment & Plan:  CAIRO [name] is a 67 y.o. male  Type II or unspecified type diabetes mellitus without mention of complication, not stated as uncontrolled - Plan: POCT glucose (manual entry), POCT glycosylated hemoglobin (Hb A1C), HM Diabetes Foot Exam.  Improved A1c  and off Glipizide.  Commended on this.  Continue metformin 1000mg  BID.   HTN (hypertension) - Plan: metoprolol succinate (TOPROL-XL) 50 MG 24 hr tablet, Comprehensive metabolic panel.  meds clarified in office with home Rx''s, and on review of med plan form cardiology OV in July. Should be off norvasc, so discontinued this. Continued other regimen. Refilled toprol and Cozaar.   Watch home Bp's and if elevating, may need to adjust regimen. rtc precautions.  Abnormal EKG -  history of suspected inferior wall silent MI.  Improved diabetic control as above, and will continue to watch BP control.  Follow up with cardiology as scheduled.   Hepatocellular carcinoma - s/p partial L hepatectomy. Pan noted form last office visit with Dr. Donell Beers for repeat AFP in 3 and 6 months. CMP and AFP drawn today in reparation for his office visit in 2 days.   Need for prophylactic vaccination and inoculation against influenza - Plan: Flu Vaccine QUAD 36+ mos IM given.   Meds ordered this encounter  Medications  . metoprolol succinate (TOPROL-XL) 50 MG 24 hr tablet    Sig: Take 1 tablet (50 mg total) by mouth daily.    Dispense:  90 tablet    Refill:  1  . losartan (COZAAR) 25 MG tablet    Sig: Take 1 tablet (25 mg total) by mouth daily.    Dispense:  90 tablet    Refill:  1   Patient Instructions  Based on review of your medicines and the notes from cardiology, it appears you are not supposed to be on amlodipine, so I discontinued this. Keep a record of your blood pressures outside of the office and bring them to the next office visit, but if running above 140/90 - return to discuss medicines. Diabetes at goal. Continue metformin twice per day. recheck in 3 months.

## 2013-06-02 ENCOUNTER — Telehealth (INDEPENDENT_AMBULATORY_CARE_PROVIDER_SITE_OTHER): Payer: Self-pay

## 2013-06-02 LAB — AFP TUMOR MARKER: AFP-Tumor Marker: 2.4 ng/mL (ref 0.0–8.0)

## 2013-06-02 NOTE — Telephone Encounter (Signed)
Patient states he was to have labs yesterday, but the labs had not been ordered per [facility]. I see 06-01-13  AFP tumor results . I do not see any labs to be ordered on Dr. Andree Elk last note. Patient has appt 06-03-13 12n with Dr. Andree Elk  Please advise

## 2013-06-03 ENCOUNTER — Encounter (INDEPENDENT_AMBULATORY_CARE_PROVIDER_SITE_OTHER): Payer: Self-pay | Admitting: General Surgery

## 2013-06-03 ENCOUNTER — Ambulatory Visit (INDEPENDENT_AMBULATORY_CARE_PROVIDER_SITE_OTHER): Payer: Medicare Other | Admitting: General Surgery

## 2013-06-03 VITALS — BP 136/70 | HR 68 | Resp 16 | Ht 69.5 in | Wt 212.2 lb

## 2013-06-03 DIAGNOSIS — C220 Liver cell carcinoma: Secondary | ICD-10-CM

## 2013-06-03 DIAGNOSIS — C228 Malignant neoplasm of liver, primary, unspecified as to type: Secondary | ICD-10-CM

## 2013-06-03 NOTE — Progress Notes (Signed)
HISTORY: Patient is a 67 year old male with a diagnosis of hepatocellular cancer.  He has been doing well. His energy level is almost back to normal. He has occasional episodes of a sensation like a bee sting in his incisional region.  He also complains of a fair amount of itching in his incision. He has some difficulty sitting up. He has not lost any weight. He denies any nausea or vomiting. His appetite is good. He is not constipated or having diarrhea. His AFP was repeated and was 2.4.   PERTINENT REVIEW OF SYSTEMS: Otherwise negative.  BP meds have changed and are correct in system.    Filed Vitals:   06/03/13 1148  BP: 136/70  Pulse: 68  Resp: 16   Filed Weights   06/03/13 1148  Weight: 212 lb 3.2 oz (96.253 kg)     EXAM: Head: Normocephalic and atraumatic.  Eyes:  Conjunctivae are normal. Pupils are equal, round, and reactive to light. No scleral icterus.  Neck:  Normal range of motion. Neck supple. No tracheal deviation present. No thyromegaly present.  Resp: No respiratory distress, normal effort. Abd:  Abdomen is soft, non distended and non tender. No masses are palpable.  There is no rebound and no guarding.   No hernia present at the incision. The skin is quite dry at the incision. Neurological: Alert and oriented to person, place, and time. Coordination normal.  Skin: Skin is warm and dry. No rash noted. No diaphoretic. No erythema. No pallor.  Psychiatric: Normal mood and affect. Normal behavior. Judgment and thought content normal.      ASSESSMENT AND PLAN:   Cancer, hepatocellular Patient has no clinical evidence of disease. He is doing well. His complaints stem from issues of the scar and these are improving.  I cannot find that he ever saw an oncologist. I do not anticipate that they would do any chemotherapy, but I would like their opinion and for them and for him to have an established provider should he develop recurrence or metastatic disease.  His AFP was  fine.  We will check AFP in 3 months.    I advised him to moisturize his vision and to massage it when he has those complaints of stinging and itching.    Maudry Diego, MD Surgical Oncology, General & Endocrine Surgery [facility]  Shade Flood, MD Shade Flood, MD

## 2013-06-03 NOTE — Patient Instructions (Signed)
Follow up with me in 6 months.    We will make referral to Cancer center.

## 2013-06-03 NOTE — Assessment & Plan Note (Signed)
Patient has no clinical evidence of disease. He is doing well. His complaints stem from issues of the scar and these are improving.  I cannot find that he ever saw an oncologist. I do not anticipate that they would do any chemotherapy, but I would like their opinion and for them and for him to have an established provider should he develop recurrence or metastatic disease.  His AFP was fine.  We will check AFP in 3 months.

## 2013-06-04 ENCOUNTER — Other Ambulatory Visit (INDEPENDENT_AMBULATORY_CARE_PROVIDER_SITE_OTHER): Payer: Self-pay | Admitting: General Surgery

## 2013-06-04 ENCOUNTER — Telehealth: Payer: Self-pay | Admitting: *Deleted

## 2013-06-04 DIAGNOSIS — C220 Liver cell carcinoma: Secondary | ICD-10-CM

## 2013-06-04 NOTE — Telephone Encounter (Signed)
Spoke with patient by phone and confirmed appointment with Dr. Truett Perna for 08/17/13.  Directions, phone numbers and contact names were provided.

## 2013-06-17 ENCOUNTER — Encounter: Payer: Self-pay | Admitting: Family Medicine

## 2013-06-23 ENCOUNTER — Other Ambulatory Visit: Payer: Self-pay

## 2013-06-23 DIAGNOSIS — I10 Essential (primary) hypertension: Secondary | ICD-10-CM

## 2013-06-23 DIAGNOSIS — E119 Type 2 diabetes mellitus without complications: Secondary | ICD-10-CM

## 2013-06-23 MED ORDER — PRAVASTATIN SODIUM 20 MG PO TABS: 20.0000 mg | ORAL_TABLET | Freq: Every day | ORAL | Status: DC

## 2013-06-24 ENCOUNTER — Telehealth: Payer: Self-pay | Admitting: *Deleted

## 2013-06-24 NOTE — Telephone Encounter (Signed)
Confirmed appointment with patient for 07/03/13 with Dr. Truett Perna.

## 2013-07-03 ENCOUNTER — Ambulatory Visit (HOSPITAL_BASED_OUTPATIENT_CLINIC_OR_DEPARTMENT_OTHER): Payer: Medicare Other | Admitting: Oncology

## 2013-07-03 ENCOUNTER — Encounter: Payer: Self-pay | Admitting: Oncology

## 2013-07-03 ENCOUNTER — Telehealth: Payer: Self-pay | Admitting: Oncology

## 2013-07-03 ENCOUNTER — Ambulatory Visit: Payer: Medicare Other

## 2013-07-03 ENCOUNTER — Ambulatory Visit (HOSPITAL_BASED_OUTPATIENT_CLINIC_OR_DEPARTMENT_OTHER): Payer: Medicare Other

## 2013-07-03 VITALS — BP 149/74 | HR 77 | Temp 97.6°F | Resp 18 | Ht 69.5 in | Wt 216.4 lb

## 2013-07-03 DIAGNOSIS — C229 Malignant neoplasm of liver, not specified as primary or secondary: Secondary | ICD-10-CM

## 2013-07-03 DIAGNOSIS — C220 Liver cell carcinoma: Secondary | ICD-10-CM

## 2013-07-03 DIAGNOSIS — E119 Type 2 diabetes mellitus without complications: Secondary | ICD-10-CM

## 2013-07-03 DIAGNOSIS — K7460 Unspecified cirrhosis of liver: Secondary | ICD-10-CM

## 2013-07-03 DIAGNOSIS — B1910 Unspecified viral hepatitis B without hepatic coma: Secondary | ICD-10-CM

## 2013-07-03 NOTE — Telephone Encounter (Signed)
gv and printed appt sched and avs for pt for Jan 2015....gv pt barium    °

## 2013-07-03 NOTE — Progress Notes (Signed)
[facility] New Patient Consult   Referring ZO:XWRUE Leamon Palau 67 y.o.  [birth year]    Reason for Referral: Hepatocellular carcinoma     HPI: He reports feeling completely well until he developed the sudden onset of right abdominal pain 01/15/2013. He presented to the emergency room and was found to have an enhancing lesion in the right liver measuring 5 x 4.5 cm. An adjacent high density fluid collection was noted in the peritoneal space suggestive of a hematoma. No additional hepatic lesions. He was observed in the hospital and did not require any intervention.  He was referred for ultrasound-guided biopsy of the liver on 02/04/2013 and this confirmed hepatocellular carcinoma.  He was referred to Dr. Donell Beers.  An MRI of the abdomen on 02/18/2013 confirmed changes of cirrhosis. An isolated liver mass measured 3.7 x 3.4 x 3.5 cm. The lesion was in the lateral left lobe segment 3. An intraperitoneal hematoma had decreased in size. Prominent nodes at the porta hepatis were favored to be reactive and related to cirrhosis. No evidence of distant metastatic disease. A slightly enlarged right cardiophrenic node measured 1 cm.  He was taken the operating room on 03/02/2013 4 a partial left hepatectomy. No evidence of peritoneal carcinomatosis. The lesion in the left liver was covered by omentum. No additional lesions were seen on ultrasound of the liver. The tumor was superficial. The omentum was adherent to tumor.  The pathology ([id number]) C. revealed a 4.8 cm hepatocellular carcinoma. No lymphovascular invasion. The omentum was negative for tumor. The tumor was confined to the hepatic parenchyma and the margins were negative. Virtually the entire mass was necrotic tumor. In viable areas grade 3 hepatocellular carcinoma was identified. The surrounding liver demonstrated features of cirrhosis.  He reports feeling well. He was not aware of a history of cirrhosis  or hepatitis prior to the diagnosis of hepatocellular carcinoma.    Past Medical History  Diagnosis Date  . Diabetes mellitus without complication   . Hypertension   . Hyperlipidemia   . Cancer  03/02/2013     hepatocellular cancer -grade 3, status post a partial left hepatectomy   . Hepatitis B. core antibody positive   02/10/2013   . Narcolepsy     per office visit note of 08/2011   . Sleep apnea     .    Bilateral hearing loss  Past Surgical History  Procedure Laterality Date  .  cervical spine     . Laparoscopy  03/02/2013    Procedure: LAPAROSCOPY DIAGNOSTIC;  Surgeon: Almond Lint, MD;  Location: [facility];  Service: General;;  . Liver ultrasound  03/02/2013    Procedure: LIVER ULTRASOUND;  Surgeon: Almond Lint, MD;  Location: [facility];  Service: General;;  . Open partial hepatectomy [83]  03/02/2013    Procedure: OPEN PARTIAL HEPATECTOMY [83];  Surgeon: Almond Lint, MD;  Location: [facility];  Service: General;;  DX LAPAROSCOPY, INTRAOPERATIVE LIVER ULTRASOUND, OPEN PARTIAL HEPATECTOMY    Family History  Problem Relation Age of Onset  . Dementia Mother   . Cancer-stomach  Father   . Diabetes Brother   . Hypertension Brother   . Cancer-Colon Brother   2 maternal second cousins had "cancer ". No other family history of cancer  Current outpatient prescriptions:alfuzosin (UROXATRAL) 10 MG 24 hr tablet, Take 10 mg by mouth at bedtime. , Disp: , Rfl: ;  amLODipine (NORVASC) 10 MG tablet, Take 10 mg by mouth daily. , Disp: ,  Rfl: ;  amphetamine-dextroamphetamine (ADDERALL XR) 30 MG 24 hr capsule, Take 1 capsule (30 mg total) by mouth every morning., Disp: 30 capsule, Rfl: 0;  aspirin 81 MG tablet, Take 81 mg by mouth every morning. , Disp: , Rfl:  hydrochlorothiazide (HYDRODIURIL) 12.5 MG tablet, Take 12.5 mg by mouth every morning., Disp: , Rfl: ;  losartan (COZAAR) 25 MG tablet, Take 1 tablet (25 mg total) by mouth daily., Disp: 90 tablet, Rfl: 1;  metFORMIN (GLUCOPHAGE) 1000 MG tablet,  Take 1 tablet (1,000 mg total) by mouth 2 (two) times daily with a meal., Disp: 180 tablet, Rfl: 1 metoprolol succinate (TOPROL-XL) 50 MG 24 hr tablet, Take 1 tablet (50 mg total) by mouth daily., Disp: 90 tablet, Rfl: 1;  pravastatin (PRAVACHOL) 20 MG tablet, Take 1 tablet (20 mg total) by mouth at bedtime., Disp: 90 tablet, Rfl: 1;  predniSONE (STERAPRED UNI-PAK) 10 MG tablet, , Disp: , Rfl: ;  Sodium Oxybate (XYREM PO), Take 4.5 mg by mouth at bedtime as needed (for sleep)., Disp: , Rfl:  spironolactone (ALDACTONE) 25 MG tablet, Take 1 tablet (25 mg total) by mouth 2 (two) times daily., Disp: 180 tablet, Rfl: 1  Allergies:  Allergies  Allergen Reactions  . Ace Inhibitors Swelling    Angioedema - face.     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 He does not use tobacco or alcohol. He quit smoking cigarettes in 2012. No transfusion history. No risk factor for HIV or hepatitis.   ROS:   Positives include: Recent ringing in hearing loss at the left ear-evaluated by ENT  A complete ROS was otherwise negative.  Physical Exam:  Blood pressure 149/74, pulse 77, temperature 97.6 F (36.4 C), temperature source Oral, resp. rate 18, height 5' 9.5" (1.765 m), weight 216 lb 6.4 oz (98.158 kg), SpO2 99.00%.  HEENT: Multiple missing teeth, oropharynx without visible mass, neck without mass Lungs: Clear bilaterally Cardiac: Regular rate and rhythm Abdomen: No hepatosplenomegaly, no mass, nontender GU: Atrophy of the left testicle, testes without mass  Vascular: No leg edema Lymph nodes: No cervical, supra-clavicular, or inguinal nodes,? 1/2 cm bilateral medial axillary nodes Neurologic: Alert and oriented, the motor exam appears intact in the upper and lower extremities Skin: No rash, no telangiectasias Musculoskeletal: No spine tenderness   LAB:  CBC  Lab Results  Component Value Date   WBC 9.5 03/08/2013   HGB 11.1* 03/08/2013   HCT  34.3* 03/08/2013   MCV 85.5 03/08/2013   PLT 215 03/08/2013     CMP      Component Value Date/Time   NA 140 06/01/2013 1500   K 4.2 06/01/2013 1500   CL 108 06/01/2013 1500   CO2 25 06/01/2013 1500   GLUCOSE 127* 06/01/2013 1500   BUN 12 06/01/2013 1500   CREATININE 1.19 06/01/2013 1500   CREATININE 1.05 03/09/2013 0100   CALCIUM 9.3 06/01/2013 1500   PROT 7.4 06/01/2013 1500   ALBUMIN 3.8 06/01/2013 1500   AST 26 06/01/2013 1500   ALT 21 06/01/2013 1500   ALKPHOS 65 06/01/2013 1500   BILITOT 0.5 06/01/2013 1500   GFRNONAA 71* 03/09/2013 0100   GFRAA 82* 03/09/2013 0100     Radiology: As per history of present illness   Assessment/Plan:   1. Hepatocellular carcinoma, stage I (T1 NX), status post a partial left hepatectomy 03/02/2013  2. Cirrhosis  3. Hepatitis B core antibody positive  4. Diabetes    Disposition:  Mr. Spilde was diagnosed with hepatocellular carcinoma and underwent a partial left hepatectomy in July. There is no evidence of recurrent disease. He will be scheduled for a CT of the abdomen and office visit in 2 months.  He does not have symptoms related to cirrhosis.  There is no indication for adjuvant therapy.   Tommi Crepeau 07/03/2013, 4:21 PM

## 2013-07-14 ENCOUNTER — Other Ambulatory Visit: Payer: Self-pay | Admitting: Neurology

## 2013-07-14 DIAGNOSIS — G47411 Narcolepsy with cataplexy: Secondary | ICD-10-CM

## 2013-07-14 MED ORDER — AMPHETAMINE-DEXTROAMPHET ER 30 MG PO CP24: 30.0000 mg | ORAL_CAPSULE | Freq: Every morning | ORAL | Status: DC

## 2013-07-15 NOTE — Telephone Encounter (Signed)
Called patient and spoke with patient's friend Thomas Hoff, about patient's prescription being ready to be picked up. The patient's friend Floretts verbalized understanding.

## 2013-08-10 ENCOUNTER — Other Ambulatory Visit: Payer: Self-pay | Admitting: Family Medicine

## 2013-08-10 ENCOUNTER — Telehealth: Payer: Self-pay

## 2013-08-10 NOTE — Telephone Encounter (Signed)
Form re: an event after use of Xyrem (tumor on liver burst in 2014) was being reported to Dr Neva Seat. He has signed form and faxed back. Faxed note to Kinnie Scales that has been done already.

## 2013-08-17 ENCOUNTER — Ambulatory Visit: Payer: Medicare Other

## 2013-08-17 ENCOUNTER — Other Ambulatory Visit: Payer: Self-pay | Admitting: *Deleted

## 2013-08-17 ENCOUNTER — Ambulatory Visit: Payer: Medicare Other | Admitting: Oncology

## 2013-08-17 DIAGNOSIS — G47411 Narcolepsy with cataplexy: Secondary | ICD-10-CM

## 2013-08-17 MED ORDER — AMPHETAMINE-DEXTROAMPHET ER 30 MG PO CP24: 30.0000 mg | ORAL_CAPSULE | Freq: Every morning | ORAL | Status: DC

## 2013-08-18 NOTE — Telephone Encounter (Signed)
Notified patient Rx ready for pick up. °

## 2013-08-31 ENCOUNTER — Other Ambulatory Visit: Payer: Medicare Other

## 2013-08-31 ENCOUNTER — Encounter (HOSPITAL_COMMUNITY): Payer: Self-pay

## 2013-08-31 ENCOUNTER — Ambulatory Visit (HOSPITAL_COMMUNITY)
Admission: RE | Admit: 2013-08-31 | Discharge: 2013-08-31 | Disposition: A | Discharge status: Home / Self Care | Payer: Medicare HMO | Source: Ambulatory Visit | Attending: Oncology | Admitting: Oncology

## 2013-08-31 DIAGNOSIS — C228 Malignant neoplasm of liver, primary, unspecified as to type: Secondary | ICD-10-CM | POA: Insufficient documentation

## 2013-08-31 DIAGNOSIS — R599 Enlarged lymph nodes, unspecified: Secondary | ICD-10-CM | POA: Insufficient documentation

## 2013-08-31 DIAGNOSIS — N289 Disorder of kidney and ureter, unspecified: Secondary | ICD-10-CM | POA: Insufficient documentation

## 2013-08-31 DIAGNOSIS — C220 Liver cell carcinoma: Secondary | ICD-10-CM

## 2013-08-31 DIAGNOSIS — I8500 Esophageal varices without bleeding: Secondary | ICD-10-CM | POA: Insufficient documentation

## 2013-08-31 LAB — COMPREHENSIVE METABOLIC PANEL (CC13)
ALT: 29 U/L (ref 0–55)
AST: 28 U/L (ref 5–34)
Albumin: 3.7 g/dL (ref 3.5–5.0)
Alkaline Phosphatase: 68 U/L (ref 40–150)
Anion Gap: 11 mEq/L (ref 3–11)
BUN: 12 mg/dL (ref 7.0–26.0)
CO2: 23 mEq/L (ref 22–29)
Calcium: 9.3 mg/dL (ref 8.4–10.4)
Chloride: 104 mEq/L (ref 98–109)
Creatinine: 1 mg/dL (ref 0.7–1.3)
Glucose: 185 mg/dl — ABNORMAL HIGH (ref 70–140)
Potassium: 4.2 mEq/L (ref 3.5–5.1)
Sodium: 138 mEq/L (ref 136–145)
Total Bilirubin: 0.6 mg/dL (ref 0.20–1.20)
Total Protein: 7.8 g/dL (ref 6.4–8.3)

## 2013-08-31 LAB — AFP TUMOR MARKER: AFP-Tumor Marker: 2.4 ng/mL (ref 0.0–8.0)

## 2013-08-31 MED ORDER — IOHEXOL 300 MG/ML  SOLN
100.0000 mL | Freq: Once | INTRAMUSCULAR | Status: AC | PRN
Start: 2013-08-31 — End: 2013-08-31
  Administered 2013-08-31: 100 mL via INTRAVENOUS

## 2013-09-03 ENCOUNTER — Ambulatory Visit (HOSPITAL_BASED_OUTPATIENT_CLINIC_OR_DEPARTMENT_OTHER): Payer: Medicare HMO | Admitting: Nurse Practitioner

## 2013-09-03 ENCOUNTER — Telehealth: Payer: Self-pay | Admitting: Oncology

## 2013-09-03 VITALS — BP 137/70 | HR 84 | Temp 98.0°F | Resp 18 | Ht 69.5 in | Wt 220.6 lb

## 2013-09-03 DIAGNOSIS — C220 Liver cell carcinoma: Secondary | ICD-10-CM

## 2013-09-03 DIAGNOSIS — I8500 Esophageal varices without bleeding: Secondary | ICD-10-CM

## 2013-09-03 DIAGNOSIS — E119 Type 2 diabetes mellitus without complications: Secondary | ICD-10-CM

## 2013-09-03 DIAGNOSIS — C229 Malignant neoplasm of liver, not specified as primary or secondary: Secondary | ICD-10-CM

## 2013-09-03 DIAGNOSIS — K7460 Unspecified cirrhosis of liver: Secondary | ICD-10-CM

## 2013-09-03 NOTE — Progress Notes (Addendum)
OFFICE PROGRESS NOTE  Interval history:  [name] returns for followup of hepatocellular carcinoma. He overall is feeling well. He has occasional abdominal pain. No nausea or vomiting. No diarrhea or constipation. He has a good appetite.   Objective: Filed Vitals:   09/03/13 0906  BP: 137/70  Pulse: 84  Temp: 98 F (36.7 C)  Resp: 18   No thrush or ulcerations. No palpable cervical, supraclavicular, axillary or inguinal lymph nodes. Lungs are clear. Regular cardiac rhythm. Abdomen soft and nontender. No organomegaly. No mass. Trace bilateral pretibial and ankle edema.   Lab Results: Lab Results  Component Value Date   WBC 9.5 03/08/2013   HGB 11.1* 03/08/2013   HCT 34.3* 03/08/2013   MCV 85.5 03/08/2013   PLT 215 03/08/2013   NEUTROABS 5.3 03/08/2013    Chemistry:    Chemistry      Component Value Date/Time   NA 138 08/31/2013 0808   NA 140 06/01/2013 1500   K 4.2 08/31/2013 0808   K 4.2 06/01/2013 1500   CL 108 06/01/2013 1500   CO2 23 08/31/2013 0808   CO2 25 06/01/2013 1500   BUN 12.0 08/31/2013 0808   BUN 12 06/01/2013 1500   CREATININE 1.0 08/31/2013 0808   CREATININE 1.19 06/01/2013 1500   CREATININE 1.05 03/09/2013 0100      Component Value Date/Time   CALCIUM 9.3 08/31/2013 0808   CALCIUM 9.3 06/01/2013 1500   ALKPHOS 68 08/31/2013 0808   ALKPHOS 65 06/01/2013 1500   AST 28 08/31/2013 0808   AST 26 06/01/2013 1500   ALT 29 08/31/2013 0808   ALT 21 06/01/2013 1500   BILITOT 0.60 08/31/2013 0808   BILITOT 0.5 06/01/2013 1500     08/31/2013 AFP 2.4.  Studies/Results: Ct Abdomen W Contrast  08/31/2013   CLINICAL DATA:  Hepatocellular carcinoma diagnosed in 2014.  EXAM: CT ABDOMEN WITH CONTRAST  TECHNIQUE: Multidetector CT imaging of the abdomen was performed using the standard protocol following bolus administration of intravenous contrast.  CONTRAST:  186mL OMNIPAQUE IOHEXOL 300 MG/ML  SOLN  COMPARISON:  MRI of the abdomen 02/18/2013.  FINDINGS: Lung Bases:  Superior to the liver in the juxtapericardiac fat of the lower anterior mediastinum there are several borderline enlarged and minimally enlarged lymph nodes, largest of which measures 11 mm, slightly increased compared to prior study 01/15/2013. Small esophageal varices.  Abdomen: Status post partial hepatectomy involving predominantly the subcapsular aspect of the left lobe of the liver. The previously noted hypervascular liver mass is no longer confidently identified. Along the site of resection there is a chronic low-attenuation postoperative fluid collection, favored to represent a subcapsular seroma, with some underlying surgical clips along the liver surface. No definite area of hypervascularity or avid enhancement in the underlying liver parenchyma is identified at this time to suggest residual or recurrent disease. No new hepatic lesions are noted.  The appearance of the gallbladder, pancreas, spleen, bilateral adrenal glands and the left kidney is unremarkable. Sub cm low-attenuation lesion in the interpolar region of the right kidney is too small to definitively characterize, but is similar to the prior study and favored to represent a small cyst. Within the visualized peritoneal cavity there is no significant volume of ascites, no pneumoperitoneum and no pathologic distention of small bowel. No definite lymphadenopathy in the abdomen. Several prominent but non pathologically enlarged upper abdominal ligament (gastrohepatic and hepatoduodenal) lymph nodes are similar to the prior study.  Musculoskeletal: There are no aggressive appearing lytic or blastic lesions  noted in the visualized portions of the skeleton.  IMPRESSION: 1. Postoperative changes of partial hepatectomy with small postoperative fluid collection along the resection margin, likely to represent a postoperative subcapsular seroma. No definite signs to suggest residual or locally recurrent disease. 2. There has been slight interval enlargement  of a lymph node in the a lower anterior mediastinum (juxtapericardiac), which measures only 11 mm. This could certainly be reactive in the setting of recent upper abdominal surgery, but attention on future followup studies is recommended. No other definite pathologically enlarged lymph nodes are noted at this time. 3. Small distal esophageal varices.   Electronically Signed   By: Vinnie Langton M.D.   On: 08/31/2013 11:16    Medications: I have reviewed the patient's current medications.  Assessment/Plan: 1. Hepatocellular carcinoma, stage I (T1 NX) status post partial left hepatectomy 03/02/2013.   08/31/2013 AFP 2.4.  CT abdomen 08/31/2013 with postoperative changes of partial hepatectomy with small postoperative fluid collection along the resection margin. No definite signs to suggest residual or locally recurrent disease. Several borderline enlarged and minimally enlarged lymph nodes superior to the liver in the juxtapericardiac fat of the lower anterior mediastinum with the largest measuring 11 mm slightly increased compared to the prior study 01/15/2013. Several prominent non-pathologically enlarged upper abdominal ligament lymph nodes similar to the prior study. Small esophageal varices.  2. Cirrhosis. 3. Hepatitis B core antibody positive. 4. Diabetes.   Dispositon-he appears stable. Dr. Benay Spice reviewed the recent CT results with [name]. The small lymph nodes on CT are likely reactive related to surgery or cirrhosis. Dr. Benay Spice recommends a repeat CT scan at a six-month interval. We will also repeat the AFP in 6 months. We will schedule a followup visit 2-3 days after labs and CT to review the results with him. He will contact the office in the interim with any problems.  Patient seen with Dr. Benay Spice.   Ned Card ANP/GNP-BC   This was a shared visit with Ned Card. [name] is in clinical remission from the hepatocellular carcinoma. He will return for an  office visit and restaging CT in 6 months.   Julieanne Manson, M.D.

## 2013-09-03 NOTE — Telephone Encounter (Signed)
Gave pt appt for lab and MD  for July 2015, gave pt oral contrast

## 2013-09-07 ENCOUNTER — Other Ambulatory Visit: Payer: Self-pay | Admitting: Emergency Medicine

## 2013-09-07 ENCOUNTER — Encounter: Payer: Self-pay | Admitting: Family Medicine

## 2013-09-07 ENCOUNTER — Ambulatory Visit (INDEPENDENT_AMBULATORY_CARE_PROVIDER_SITE_OTHER): Payer: Medicare HMO | Admitting: Family Medicine

## 2013-09-07 VITALS — BP 124/62 | HR 67 | Temp 98.2°F | Resp 16 | Ht 69.75 in | Wt 220.0 lb

## 2013-09-07 DIAGNOSIS — C220 Liver cell carcinoma: Secondary | ICD-10-CM

## 2013-09-07 DIAGNOSIS — E1165 Type 2 diabetes mellitus with hyperglycemia: Secondary | ICD-10-CM

## 2013-09-07 DIAGNOSIS — I10 Essential (primary) hypertension: Secondary | ICD-10-CM

## 2013-09-07 DIAGNOSIS — C228 Malignant neoplasm of liver, primary, unspecified as to type: Secondary | ICD-10-CM

## 2013-09-07 DIAGNOSIS — IMO0002 Reserved for concepts with insufficient information to code with codable children: Secondary | ICD-10-CM

## 2013-09-07 DIAGNOSIS — E119 Type 2 diabetes mellitus without complications: Secondary | ICD-10-CM

## 2013-09-07 LAB — HEMOGLOBIN A1C
Hgb A1c MFr Bld: 8 % — ABNORMAL HIGH (ref <5.7)
Mean Plasma Glucose: 183 mg/dL — ABNORMAL HIGH (ref <117)

## 2013-09-07 LAB — POCT GLYCOSYLATED HEMOGLOBIN (HGB A1C): Hemoglobin A1C: 7.5

## 2013-09-07 LAB — GLUCOSE, POCT (MANUAL RESULT ENTRY): POC Glucose: 218 mg/dl — AB (ref 70–99)

## 2013-09-07 MED ORDER — AMLODIPINE BESYLATE 5 MG PO TABS: 5.0000 mg | ORAL_TABLET | Freq: Every day | ORAL | Status: DC

## 2013-09-07 MED ORDER — SPIRONOLACTONE 25 MG PO TABS: 25.0000 mg | ORAL_TABLET | Freq: Two times a day (BID) | ORAL | Status: DC

## 2013-09-07 MED ORDER — METOPROLOL SUCCINATE ER 50 MG PO TB24: 50.0000 mg | ORAL_TABLET | Freq: Every day | ORAL | Status: DC

## 2013-09-07 MED ORDER — HYDROCHLOROTHIAZIDE 12.5 MG PO TABS: 12.5000 mg | ORAL_TABLET | Freq: Every day | ORAL | Status: DC

## 2013-09-07 MED ORDER — LOSARTAN POTASSIUM 25 MG PO TABS: 25.0000 mg | ORAL_TABLET | Freq: Every day | ORAL | Status: DC

## 2013-09-07 MED ORDER — AMLODIPINE BESYLATE 5 MG PO TABS: 10.0000 mg | ORAL_TABLET | Freq: Every day | ORAL | Status: DC

## 2013-09-07 MED ORDER — METFORMIN HCL 1000 MG PO TABS: ORAL_TABLET | ORAL | Status: DC

## 2013-09-07 MED ORDER — PRAVASTATIN SODIUM 20 MG PO TABS: 20.0000 mg | ORAL_TABLET | Freq: Every day | ORAL | Status: DC

## 2013-09-07 NOTE — Patient Instructions (Addendum)
Make sure you double check your medication list from today against what you are taking at home. Only change today is Norvasc, to 5 mg, one pill once a day. Please call if there are any differences on your list versus what you have at home. Your medication for BPH and sleep will probably be refilled by your other specialists but let me know if you are running low on medication and can't get a refill from their office. You should receive a call or letter about your lab results within the next week to 10 days.  Return to the clinic or go to the nearest emergency room if any of your symptoms worsen or new symptoms occur. Plan on recheck in three months for diabetes.

## 2013-09-07 NOTE — Progress Notes (Addendum)
Subjective:    Patient ID: [name], male    DOB: 1945-12-22, 68 y.o.   MRN: [id number]  This chart was scribed for Wendie Agreste, MD by Maree Erie, ED Scribe. Patient's care was started at 1:20 PM.  Chief Complaint  Patient presents with  . 3 month follow up    hypertension, diabetes   PCP: Wendie Agreste, MD  HPI  [name] is a 67 y.o. male who presents to office here for follow up for hypertension and diabetes.   Hepatocellular carcinoma: Of note history of a hepatocellular carcinoma, diagnosed in 2014. Status post partial left hepatectomy on 03/02/13. Was referred to oncology. He was stage 1 (T1NX) plan on repeating CT scan of abdomen at six month interval and AFP at six months for few small lymph nodes, but without definite signs of residual or recurrent disease. Last CT 08/31/13.   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 Seen by Dr. Terrence Dupont. Discontinued Norvasc. He has been checking his BP at home. He was placed on a 10 day regimen of steroids with a temporary elevation of BP prior to his hearing aid being placed. He states that his numbers are back to normal, with typical readings in the 120-130/70-80. He denies any lightheadedness or dizziness. He states he has been taking a half tablet of Norvasc due to elevated BP readings after discontinuing it.    Chemistry      Component Value Date/Time   NA 138 08/31/2013 0808   NA 140 06/01/2013 1500   K 4.2 08/31/2013 0808   K 4.2 06/01/2013 1500   CL 108 06/01/2013 1500   CO2 23 08/31/2013 0808   CO2 25 06/01/2013 1500   BUN 12.0 08/31/2013 0808   BUN 12 06/01/2013 1500   CREATININE 1.0 08/31/2013 0808   CREATININE 1.19 06/01/2013 1500   CREATININE 1.05 03/09/2013 0100      Component Value Date/Time   CALCIUM 9.3 08/31/2013 0808   CALCIUM 9.3 06/01/2013 1500   ALKPHOS 68 08/31/2013 0808   ALKPHOS 65 06/01/2013 1500   AST 28 08/31/2013 0808   AST 26 06/01/2013 1500   ALT 29 08/31/2013  0808   ALT 21 06/01/2013 1500   BILITOT 0.60 08/31/2013 0808   BILITOT 0.5 06/01/2013 1500     Last lipids in 1/14: Lab Results  Component Value Date   CHOL 162 09/08/2012   HDL 42 09/08/2012   LDLCALC 89 09/08/2012   TRIG 157* 09/08/2012   CHOLHDL 3.9 09/08/2012  He reports last eating about 4.5 hours ago.  Diabetes: Continued Metformin 1000 mg BID as stable. He states his blood glucose readings have been normal as well, except for when he was on the 10 day steroid regimen. He has had readings between 130-90. He denies any symptomatic hypoglycemia in the past three months.   Lab Results  Component Value Date   HGBA1C 5.9 06/01/2013   He is going to be seen by his Podiatrist on 1/23. He was last seen by Cardiology about a month ago. He denies any changes. He states he will be seen again in a year.   Medications: He gets his Uroxatral for BPH by Dr. Janice Norrie. He was last seen by Dr. Janice Norrie about a month ago and was kept on the medication. He denies needing refills for it by me. He denies any permanent changes or additions in medications since last being seen.    Patient Active Problem List   Diagnosis Date  Noted  . Altered mental status 03/05/2013  . Respiratory failure, post-operative 03/02/2013  . Hepatitis B 02/24/2013  . Cancer, hepatocellular 02/10/2013  . Liver tumor-bleeding 01/15/2013  . Epidermal cyst 06/18/2012  . OSA (obstructive sleep apnea) 03/13/2012  . Abnormal leg movement 12/12/2011  . Blood pressure elevated 09/06/2011  . Diabetes mellitus 09/06/2011  . 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 BPH (benign prostatic hyperplasia) 09/06/2011  . Diverticula of colon 09/06/2011   Past Medical History  Diagnosis Date  . Diabetes mellitus without complication   . Hypertension   . Hyperlipidemia   . Cancer     hepatocellular cancer   . Hepatitis   . Narcolepsy     per office visit note of 08/2011   . Sleep apnea    Past Surgical History  Procedure Laterality Date  . Pinched  nerve in back    . Laparoscopy  03/02/2013    Procedure: LAPAROSCOPY DIAGNOSTIC;  Surgeon: [name], MD;  Location: [facility];  Service: General;;  . Liver ultrasound  03/02/2013    Procedure: LIVER ULTRASOUND;  Surgeon: [name], MD;  Location: [facility];  Service: General;;  . Open partial hepatectomy [83]  03/02/2013    Procedure: OPEN PARTIAL HEPATECTOMY [83];  Surgeon: [name], MD;  Location: [facility];  Service: General;;  DX LAPAROSCOPY, INTRAOPERATIVE LIVER ULTRASOUND, OPEN PARTIAL HEPATECTOMY   Allergies  Allergen Reactions  . Ace Inhibitors Swelling    Angioedema - face.    Prior to Admission medications   Medication Sig Start Date End Date Taking? Authorizing Provider  alfuzosin (UROXATRAL) 10 MG 24 hr tablet Take 10 mg by mouth at bedtime.  12/19/12  Yes Historical Provider, MD  amLODipine (NORVASC) 10 MG tablet Take 10 mg by mouth daily.  04/15/13  Yes Historical Provider, MD  amphetamine-dextroamphetamine (ADDERALL XR) 30 MG 24 hr capsule Take 1 capsule (30 mg total) by mouth every morning. 08/17/13  Yes Larey Seat, MD  aspirin 81 MG tablet Take 81 mg by mouth every morning.    Yes Historical Provider, MD  hydrochlorothiazide (HYDRODIURIL) 12.5 MG tablet Take 12.5 mg by mouth every morning. 09/08/12  Yes Wendie Agreste, MD  losartan (COZAAR) 25 MG tablet Take 1 tablet (25 mg total) by mouth daily. 06/01/13 06/01/14 Yes Wendie Agreste, MD  metFORMIN (GLUCOPHAGE) 1000 MG tablet TAKE ONE TABLET BY MOUTH TWICE DAILY WITH A MEAL 08/10/13  Yes Wendie Agreste, MD  metoprolol succinate (TOPROL-XL) 50 MG 24 hr tablet Take 1 tablet (50 mg total) by mouth daily. 06/01/13  Yes Wendie Agreste, MD  pravastatin (PRAVACHOL) 20 MG tablet Take 1 tablet (20 mg total) by mouth at bedtime. 06/23/13  Yes Wendie Agreste, MD  Sodium Oxybate (XYREM PO) Take 4.5 mg by mouth at bedtime as needed (for sleep).   Yes Historical Provider, MD  spironolactone (ALDACTONE) 25 MG tablet Take 1 tablet  (25 mg total) by mouth 2 (two) times daily. 01/22/13  Yes Wendie Agreste, MD   History   Social History  . Marital Status: Single    Spouse Name: N/A    Number of Children: N/A  . Years of Education: N/A   Occupational History  . Not on file.   Social History Main Topics  . Smoking status: Former Smoker    Quit date: 02/18/2011  . Smokeless tobacco: Never Used  . Alcohol Use: No  . Drug Use: No  . Sexual Activity: Yes   Other Topics Concern  . Not  on file   Social History Narrative   Single, lives alone   Has #2 grown children, not in area or involved in his life   Retired      Review of Systems  Constitutional: Negative for fatigue and unexpected weight change.  Eyes: Negative for visual disturbance.  Respiratory: Negative for cough, chest tightness and shortness of breath.   Cardiovascular: Negative for chest pain, palpitations and leg swelling.  Gastrointestinal: Negative for abdominal pain and blood in stool.  Neurological: Negative for dizziness, light-headedness and headaches.       Objective:   Physical Exam  Vitals reviewed. Constitutional: He is oriented to person, place, and time. He appears well-developed and well-nourished.  HENT:  Head: Normocephalic and atraumatic.  Eyes: EOM are normal. Pupils are equal, round, and reactive to light.  Neck: No JVD present. Carotid bruit is not present.  Cardiovascular: Normal rate, regular rhythm, normal heart sounds and intact distal pulses.   No murmur heard. Pulmonary/Chest: Effort normal and breath sounds normal. He has no rales.  Abdominal: Soft. There is no tenderness.  Musculoskeletal: He exhibits no edema.  Neurological: He is alert and oriented to person, place, and time.  Microfilament testing of feet normal bilaterally.  Skin: Skin is warm, dry and intact. No rash noted.  Psychiatric: He has a normal mood and affect. His behavior is normal.     Filed Vitals:   09/07/13 1305  BP: 124/62  Pulse:  67  Temp: 98.2 F (36.8 C)  TempSrc: Oral  Resp: 16  Height: 5' 9.75" (1.772 m)  Weight: 220 lb (99.791 kg)  SpO2: 99%   Results for orders placed in visit on 09/07/13  GLUCOSE, POCT (MANUAL RESULT ENTRY)      Result Value Range   POC Glucose 218 (*) 70 - 99 mg/dl  POCT GLYCOSYLATED HEMOGLOBIN (HGB A1C)      Result Value Range   Hemoglobin A1C 7.5         Assessment & Plan:  [name] is a 68 y.o. male Cancer, hepatocellular - Plan - stable with recent oncology eval. Plan for CT and AFP in six months.   Unspecified essential hypertension- plan- controlled. Will continue current medications, including 5 mg of Norvasc as he has been taking this for current level of control. Plan for him to check his medications at home against AVS to make sure these correlate.  Diabetes - Plan: POCT glucose (manual entry), POCT glycosylated hemoglobin (Hb A1C). Less controlled today but may be due to previous Prednisone use. Will continue same dose of Metformin for now but to check home blood sugars and if these are elevating, call me to discuss plan. Otherwise check A1C in three months.   Hx hearing difficulty - now improved with hearing aid.   BPH - follow up with Dr. Janice Norrie, routine. Cont uroxatral.   Insomnia- Follow up with Dr. Brett Fairy in in March, controlled by Xyrem  No orders of the defined types were placed in this encounter.   Patient Instructions  Make sure you double check your medication list from today against what you are taking at home. Please call if there are any differences on your list versus what you have at home. Your medication for BPH and sleep will probably be refilled by your other specialists but let me know if you are running low on medication and can't get a refill from their office. You should receive a call or letter about your lab results within the  next week to 10 days.  Return to the clinic or go to the nearest emergency room if any of your symptoms worsen  or new symptoms occur. Plan on recheck in three months for diabetes.

## 2013-09-08 LAB — LIPID PANEL
Cholesterol: 126 mg/dL (ref 0–200)
HDL: 45 mg/dL (ref >39)
LDL Cholesterol: 51 mg/dL (ref 0–99)
Total CHOL/HDL Ratio: 2.8 Ratio
Triglycerides: 148 mg/dL (ref <150)
VLDL: 30 mg/dL (ref 0–40)

## 2013-10-10 ENCOUNTER — Ambulatory Visit: Payer: Medicare HMO

## 2013-10-10 ENCOUNTER — Ambulatory Visit (INDEPENDENT_AMBULATORY_CARE_PROVIDER_SITE_OTHER): Payer: Medicare HMO | Admitting: Emergency Medicine

## 2013-10-10 VITALS — BP 126/80 | HR 87 | Temp 98.0°F | Resp 18 | Wt 223.0 lb

## 2013-10-10 DIAGNOSIS — L309 Dermatitis, unspecified: Secondary | ICD-10-CM

## 2013-10-10 DIAGNOSIS — M25569 Pain in unspecified knee: Secondary | ICD-10-CM

## 2013-10-10 DIAGNOSIS — L0291 Cutaneous abscess, unspecified: Secondary | ICD-10-CM

## 2013-10-10 DIAGNOSIS — M25561 Pain in right knee: Secondary | ICD-10-CM

## 2013-10-10 DIAGNOSIS — L0390 Cellulitis, unspecified: Secondary | ICD-10-CM

## 2013-10-10 NOTE — Progress Notes (Signed)
[facility] [address], [place] [address] [id number]  Date:  10/10/2013   Name:  [name]   DOB:  06-25-1946   MRN:  [id number]  PCP:  Wendie Agreste, MD    Chief Complaint: Knee Injury, Leg Swelling and Rash   History of Present Illness:  [name] is a 68 y.o. very pleasant male patient who presents with the following:  Fell onto the pavement on Saturday and landed on his right knee.  Now has pain in the knee that is worse on standing and improves with walking.  No deformity or bruising.  Some abrasions.  Has dry skin on legs and is pruritic.  dermatitis   Patient Active Problem List   Diagnosis Date Noted  . Altered mental status 03/05/2013  . Respiratory failure, post-operative 03/02/2013  . Hepatitis B 02/24/2013  . Cancer, hepatocellular 02/10/2013  . Liver tumor-bleeding 01/15/2013  . Epidermal cyst 06/18/2012  . OSA (obstructive sleep apnea) 03/13/2012  . Abnormal leg movement 12/12/2011  . Blood pressure elevated 09/06/2011  . Diabetes mellitus 09/06/2011  . 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 BPH (benign prostatic hyperplasia) 09/06/2011  . Diverticula of colon 09/06/2011    Past Medical History  Diagnosis Date  . Diabetes mellitus without complication   . Hypertension   . Hyperlipidemia   . Cancer     hepatocellular cancer   . Hepatitis   . Narcolepsy     per office visit note of 08/2011   . Sleep apnea     Past Surgical History  Procedure Laterality Date  . Pinched nerve in back    . Laparoscopy  03/02/2013    Procedure: LAPAROSCOPY DIAGNOSTIC;  Surgeon: [name], MD;  Location: [facility];  Service: General;;  . Liver ultrasound  03/02/2013    Procedure: LIVER ULTRASOUND;  Surgeon: [name], MD;  Location: [facility];  Service: General;;  . Open partial hepatectomy [83]  03/02/2013    Procedure: OPEN PARTIAL HEPATECTOMY [83];  Surgeon: [name], MD;  Location: [facility];  Service: General;;  DX LAPAROSCOPY,  INTRAOPERATIVE LIVER ULTRASOUND, OPEN PARTIAL HEPATECTOMY    History  Substance Use Topics  . Smoking status: Former Smoker    Quit date: 02/18/2011  . Smokeless tobacco: Never Used  . Alcohol Use: No    Family History  Problem Relation Age of Onset  . Dementia Mother   . Cancer Father   . Diabetes Brother   . Hypertension Brother   . Cancer Brother     Allergies  Allergen Reactions  . Ace Inhibitors Swelling    Angioedema - face.     Medication list has been reviewed and updated.  Current Outpatient Prescriptions on File Prior to Visit  Medication Sig Dispense Refill  . alfuzosin (UROXATRAL) 10 MG 24 hr tablet Take 10 mg by mouth at bedtime.       [name] Kitchen amLODipine (NORVASC) 5 MG tablet Take 1 tablet (5 mg total) by mouth daily.  90 tablet  1  . amphetamine-dextroamphetamine (ADDERALL XR) 30 MG 24 hr capsule Take 1 capsule (30 mg total) by mouth every morning.  30 capsule  0  . aspirin 81 MG tablet Take 81 mg by mouth every morning.       . hydrochlorothiazide (HYDRODIURIL) 12.5 MG tablet Take 1 tablet (12.5 mg total) by mouth daily.  90 tablet  1  . losartan (COZAAR) 25 MG tablet Take 1 tablet (25 mg total) by mouth daily.  90 tablet  1  . metFORMIN (GLUCOPHAGE) 1000 MG tablet TAKE ONE TABLET BY MOUTH TWICE DAILY WITH A MEAL  180 tablet  1  . metoprolol succinate (TOPROL-XL) 50 MG 24 hr tablet Take 1 tablet (50 mg total) by mouth daily.  90 tablet  1  . pravastatin (PRAVACHOL) 20 MG tablet Take 1 tablet (20 mg total) by mouth at bedtime.  90 tablet  1  . Sodium Oxybate (XYREM PO) Take 4.5 mg by mouth at bedtime as needed (for sleep).      [name] Kitchen spironolactone (ALDACTONE) 25 MG tablet Take 1 tablet (25 mg total) by mouth 2 (two) times daily.  180 tablet  1   No current facility-administered medications on file prior to visit.    Review of Systems:  As per HPI, otherwise negative.    Physical Examination: Filed Vitals:   10/10/13 0902  BP: 126/80  Pulse: 87  Temp: 98 F  (36.7 C)  Resp: 18   Filed Vitals:   10/10/13 0902  Weight: 223 lb (101.152 kg)   Body mass index is 32.21 kg/(m^2). Ideal Body Weight:     GEN: WDWN, NAD, Non-toxic, Alert & Oriented x 3 HEENT: Atraumatic, Normocephalic.  Ears and Nose: No external deformity. EXTR: No clubbing/cyanosis/edema NEURO: Normal gait.  PSYCH: Normally interactive. Conversant. Not depressed or anxious appearing.  Calm demeanor.  LEGS:  Skin dry and scaly.  Excoriated no cellulitis KNEE:  FULL ROM no ecchymosis or deformity.  abrasions  Assessment and Plan: Winter eczema Moisturizing lotion Contusion knee Abrasion knee Aleve  Signed,  Ellison Carwin, MD   [facility] reading (PRIMARY) by  Dr. Ouida Sills.  Negative knee.

## 2013-11-05 ENCOUNTER — Encounter: Payer: Self-pay | Admitting: Family Medicine

## 2013-11-11 ENCOUNTER — Ambulatory Visit (INDEPENDENT_AMBULATORY_CARE_PROVIDER_SITE_OTHER): Payer: Medicare HMO | Admitting: Neurology

## 2013-11-11 ENCOUNTER — Encounter (INDEPENDENT_AMBULATORY_CARE_PROVIDER_SITE_OTHER): Payer: Self-pay

## 2013-11-11 ENCOUNTER — Ambulatory Visit: Payer: Medicare Other | Admitting: Neurology

## 2013-11-11 ENCOUNTER — Encounter: Payer: Self-pay | Admitting: Neurology

## 2013-11-11 VITALS — BP 111/67 | HR 84 | Resp 16 | Ht 70.5 in | Wt 225.0 lb

## 2013-11-11 DIAGNOSIS — G47411 Narcolepsy with cataplexy: Secondary | ICD-10-CM

## 2013-11-11 DIAGNOSIS — G4733 Obstructive sleep apnea (adult) (pediatric): Secondary | ICD-10-CM

## 2013-11-11 DIAGNOSIS — C229 Malignant neoplasm of liver, not specified as primary or secondary: Secondary | ICD-10-CM

## 2013-11-11 DIAGNOSIS — G47421 Narcolepsy in conditions classified elsewhere with cataplexy: Secondary | ICD-10-CM | POA: Insufficient documentation

## 2013-11-11 DIAGNOSIS — Z79899 Other long term (current) drug therapy: Secondary | ICD-10-CM

## 2013-11-11 LAB — COMPREHENSIVE METABOLIC PANEL
ALT: 32 IU/L (ref 0–44)
AST: 38 IU/L (ref 0–40)
Albumin/Globulin Ratio: 1.2 (ref 1.1–2.5)
Albumin: 4.2 g/dL (ref 3.6–4.8)
Alkaline Phosphatase: 98 IU/L (ref 39–117)
BUN/Creatinine Ratio: 11 (ref 10–22)
BUN: 10 mg/dL (ref 8–27)
CO2: 24 mmol/L (ref 18–29)
Calcium: 9.4 mg/dL (ref 8.6–10.2)
Chloride: 100 mmol/L (ref 96–108)
Creatinine, Ser: 0.95 mg/dL (ref 0.76–1.27)
GFR calc Af Amer: 94 mL/min/{1.73_m2} (ref >59)
GFR calc non Af Amer: 81 mL/min/{1.73_m2} (ref >59)
Globulin, Total: 3.5 g/dL (ref 1.5–4.5)
Glucose: 279 mg/dL — ABNORMAL HIGH (ref 65–99)
Potassium: 4.5 mmol/L (ref 3.5–5.2)
Sodium: 137 mmol/L (ref 134–144)
Total Bilirubin: 0.5 mg/dL (ref 0.0–1.2)
Total Protein: 7.7 g/dL (ref 6.0–8.5)

## 2013-11-11 MED ORDER — SODIUM OXYBATE 500 MG/ML PO SOLN: 4500.0000 mg | Freq: Two times a day (BID) | ORAL | Status: DC

## 2013-11-11 MED ORDER — AMPHETAMINE-DEXTROAMPHET ER 30 MG PO CP24: 30.0000 mg | ORAL_CAPSULE | Freq: Every morning | ORAL | Status: DC

## 2013-11-11 MED ORDER — SODIUM OXYBATE 500 MG/ML PO SOLN: 4500.0000 mL | Freq: Two times a day (BID) | ORAL | Status: DC

## 2013-11-11 NOTE — Addendum Note (Signed)
Addended by: Larey Seat on: 11/11/2013 12:44 PM   Modules accepted: Orders

## 2013-11-11 NOTE — Patient Instructions (Signed)
Sleep Apnea   Sleep apnea is a sleep disorder characterized by abnormal pauses in breathing while you sleep. When your breathing pauses, the level of oxygen in your blood decreases. This causes you to move out of deep sleep and into light sleep. As a result, your quality of sleep is poor, and the system that carries your blood throughout your body (cardiovascular system) experiences stress. If sleep apnea remains untreated, the following conditions can develop:  · High blood pressure (hypertension).  · Coronary artery disease.  · Inability to achieve or maintain an erection (impotence).  · Impairment of your thought process (cognitive dysfunction).  There are three types of sleep apnea:  1. Obstructive sleep apnea Pauses in breathing during sleep because of a blocked airway.  2. Central sleep apnea Pauses in breathing during sleep because the area of the brain that controls your breathing does not send the correct signals to the muscles that control breathing.  3. Mixed sleep apnea A combination of both obstructive and central sleep apnea.  RISK FACTORS  The following risk factors can increase your risk of developing sleep apnea:  · Being overweight.  · Smoking.  · Having narrow passages in your nose and throat.  · Being of older age.  · Being male.  · Alcohol use.  · Sedative and tranquilizer use.  · Ethnicity. Among individuals younger than 35 years, African Americans are at increased risk of sleep apnea.  SYMPTOMS   · Difficulty staying asleep.  · Daytime sleepiness and fatigue.  · Loss of energy.  · Irritability.  · Loud, heavy snoring.  · Morning headaches.  · Trouble concentrating.  · Forgetfulness.  · Decreased interest in sex.  DIAGNOSIS   In order to diagnose sleep apnea, your caregiver will perform a physical examination. Your caregiver may suggest that you take a home sleep test. Your caregiver may also recommend that you spend the night in a sleep lab. In the sleep lab, several monitors record  information about your heart, lungs, and brain while you sleep. Your leg and arm movements and blood oxygen level are also recorded.  TREATMENT  The following actions may help to resolve mild sleep apnea:  · Sleeping on your side.    · Using a decongestant if you have nasal congestion.    · Avoiding the use of depressants, including alcohol, sedatives, and narcotics.    · Losing weight and modifying your diet if you are overweight.  There also are devices and treatments to help open your airway:  · Oral appliances. These are custom-made mouthpieces that shift your lower jaw forward and slightly open your bite. This opens your airway.  · Devices that create positive airway pressure. This positive pressure "splints" your airway open to help you breathe better during sleep. The following devices create positive airway pressure:  · Continuous positive airway pressure (CPAP) device. The CPAP device creates a continuous level of air pressure with an air pump. The air is delivered to your airway through a mask while you sleep. This continuous pressure keeps your airway open.  · Nasal expiratory positive airway pressure (EPAP) device. The EPAP device creates positive air pressure as you exhale. The device consists of single-use valves, which are inserted into each nostril and held in place by adhesive. The valves create very little resistance when you inhale but create much more resistance when you exhale. That increased resistance creates the positive airway pressure. This positive pressure while you exhale keeps your airway open, making it easier   continuous air pressure through a mask. However, with the BPAP machine, the pressure is set at two different levels. The pressure when you  exhale is lower than the pressure when you inhale.  Surgery. Typically, surgery is only done if you cannot comply with less invasive treatments or if the less invasive treatments do not improve your condition. Surgery involves removing excess tissue in your airway to create a wider passage way. Document Released: 07/27/2002 Document Revised: 12/01/2012 Document Reviewed: 12/13/2011 ExitCare Patient Information 2014 [employer]. Narcolepsy Narcolepsy is a disabling neurological disorder of sleep regulation. It affects the control of sleep. It also affects the control of wakefulness. It is an interruption of the dreaming state of sleep. This state is known as REM or rapid eye movement sleep.  SYMPTOMS  The development, number, and severity of symptoms vary widely among people with the disorder. Symptoms generally begin between the ages of 15 and 30. The four classic symptoms of the disorder are:   Excessive daytime sleepiness.  Cataplexy. This is sudden, brief episodes of muscle weakness or paralysis. It is caused by strong emotions. Common strong emotions are laughter, anger, surprise, or anticipation.  Sleep paralysis. This is paralysis upon falling asleep or waking up.  Hallucinations. These are vivid dream-like images that occur at when you first fall asleep. Other symptoms include:   Unrelenting excessive sleepiness. This is usually the first and most obvious symptom.  Sleep attacks. Patients have strong sleep attacks throughout the day. These attacks can last for 30 seconds to more than 30 minutes. These happen no matter how much or how well the person slept the night before. These attacks end up making the person sleep at work and social events. The person can fall asleep while eating, talking, and driving. They also fall asleep at other out of place times.  Disturbed nighttime sleep.  Tossing and turning in bed.  Leg jerks.  Nightmares.  Waking up often. DIAGNOSIS  It's  possible that genetics play a role in this disorder. Narcolepsy is not a rare disorder. It is often misdiagnosed. It is often diagnosed years after symptoms first appear. Early diagnosis and treatment are important. This help the physical and mental well-being of the patient. TREATMENT  There is no cure for narcolepsy. The symptoms can be controlled with behavioral and medical therapy. The excessive daytime sleepiness may be treated with stimulant drugs. It may also be treated with the drug modafinil (Provigil). Cataplexy and other REM-sleep symptoms may be treated with antidepressant medications. Medications will reduce the symptoms. Medications will not ease symptoms entirely. Many available medications have side effects. Basic lifestyle changes may also reduce the symptoms. These changes include having regular sleep schedules and scheduled daytime naps. Other lifestyle changes include avoiding "over-stimulating" situations. Document Released: 07/27/2002 Document Revised: 10/29/2011 Document Reviewed: 08/06/2005 ExitCare Patient Information 2014 [employer].  

## 2013-11-11 NOTE — Progress Notes (Signed)
.cc Subjective:    Patient ID: [name] is a 68 y.o. male.  HPI   Interim history:    [name] is a very pleasant 68 y.o.-year-old Right-handed male, with an underlying medical history of hypertension, hyperlipidemia, diabetes, who presents for followup consultation of his narcolepsy as well as sleep apnea. The patient is unaccompanied today. I am seeing this patient on behalf of my colleague Dr. Brett Fairy. She last saw the patient on 06/11/2012 at which time he indicated that he no longer uses a CPAP machine. He has sleep study in 2012 which showed moderate obstructive sleep apnea and was titrated on CPAP of 9 cm of water pressure. He had residual sleep apnea findings without pressure and she increased his pressure to 10 cm which improved his residual AHI. While he was compliant with CPAP at the time he had problems with co-pays and indicated that he could not afford CPAP. At some point he returned a CPAP machine to his home health company. He is willing to get restarted on treatment. He has a history of narcolepsy and indicates no recent cataplectic attacks. He was tried on Provigil which did not help her sleepiness. He is currently on Xyrem 4.5 g and 3 g each night and Adderall XR 30 mg daily. He recently received refills on those medications and does not require any refills today. He indicates no new problems. He feels that his medication regimen is working well for him.   His Past Medical History Is Significant For: Past Medical History  Diagnosis Date  . Diabetes mellitus without complication   . Hypertension   . Hyperlipidemia   . Cancer     hepatocellular cancer   . Hepatitis   . Narcolepsy     per office visit note of 08/2011   . Sleep apnea     His Past Surgical History Is Significant For: Past Surgical History  Procedure Laterality Date  . Pinched nerve in back    . Laparoscopy  03/02/2013    Procedure: LAPAROSCOPY DIAGNOSTIC;  Surgeon: [name], MD;   Location: [facility];  Service: General;;  . Liver ultrasound  03/02/2013    Procedure: LIVER ULTRASOUND;  Surgeon: [name], MD;  Location: [facility];  Service: General;;  . Open partial hepatectomy [83]  03/02/2013    Procedure: OPEN PARTIAL HEPATECTOMY [83];  Surgeon: [name], MD;  Location: [facility];  Service: General;;  DX LAPAROSCOPY, INTRAOPERATIVE LIVER ULTRASOUND, OPEN PARTIAL HEPATECTOMY    His Family History Is Significant For: Family History  Problem Relation Age of Onset  . Dementia Mother   . Cancer Father   . Diabetes Brother   . Hypertension Brother   . Cancer Brother     His Social History Is Significant For: History   Social History  . Marital Status: Single    Spouse Name: N/A    Number of Children: 2  . Years of Education: 15+   Social History Main Topics  . Smoking status: Former Research scientist (life sciences)  . Smokeless tobacco: Never Used  . Alcohol Use: No  . Drug Use: No  . Sexual Activity: Yes   Other Topics Concern  . None   Social History Narrative   Patient is single and lives alone.   Has #2 grown children, not in area or involved in his life   Patient is retired.   Patient has a college education.   Patient is right-handed.   Patient drinks maybe two sodas daily.  His Allergies Are:  Allergies  Allergen Reactions  . Ace Inhibitors Swelling    Angioedema - face.   :   His Current Medications Are:  Outpatient Encounter Prescriptions as of 11/11/2013  Medication Sig  . alfuzosin (UROXATRAL) 10 MG 24 hr tablet Take 10 mg by mouth at bedtime.   [name] Kitchen amLODipine (NORVASC) 5 MG tablet Take 1 tablet (5 mg total) by mouth daily.  [name] Kitchen amphetamine-dextroamphetamine (ADDERALL XR) 30 MG 24 hr capsule Take 1 capsule (30 mg total) by mouth every morning.  [name] Kitchen aspirin 81 MG tablet Take 81 mg by mouth every morning.   . hydrochlorothiazide (HYDRODIURIL) 12.5 MG tablet Take 1 tablet (12.5 mg total) by mouth daily.  [name] Kitchen losartan (COZAAR) 25 MG tablet Take 1 tablet (25 mg total)  by mouth daily.  . metFORMIN (GLUCOPHAGE) 1000 MG tablet TAKE ONE TABLET BY MOUTH TWICE DAILY WITH A MEAL  . metoprolol succinate (TOPROL-XL) 50 MG 24 hr tablet Take 1 tablet (50 mg total) by mouth daily.  . pravastatin (PRAVACHOL) 20 MG tablet Take 1 tablet (20 mg total) by mouth at bedtime.  . Sodium Oxybate (XYREM PO) Take 4.5 mg by mouth at bedtime as needed (for sleep).  [name] Kitchen spironolactone (ALDACTONE) 25 MG tablet Take 1 tablet (25 mg total) by mouth 2 (two) times daily.  : Review of Systems  All other systems reviewed and are negative.    Objective:  Neurologic Exam  Physical Exam Physical Examination:   Filed Vitals:   11/11/13 1146  BP: 111/67  Pulse: 84  Resp: 16    General Examination: The patient is a very pleasant 68 y.o. male in no acute distress. He appears well-developed and well-nourished .    Chest: Clear to auscultation without wheezing, rhonchi or crackles noted.  Heart: S1+S2+0, regular and normal without murmurs, rubs or gallops noted.   Abdomen: status post abdominal surgery after "  a liver tumor ruptured" . October 2014 .  Extremities: There is no pitting edema in the distal lower extremities bilaterally. Pedal pulses are intact.  Skin: Warm and dry without trophic changes noted. There are no varicose veins.  Musculoskeletal: exam reveals no obvious joint deformities, tenderness or joint swelling or erythema.   Neurologically:  Mental status: The patient is awake, alert and oriented in all 4 spheres. His memory, attention, language and knowledge are appropriate. There is no aphasia, agnosia, apraxia or anomia. Speech is clear with normal prosody and enunciation. Thought process is linear. Mood is congruent and affect is normal.   Cranial nerves :normocephalic, atraumatic, pupils are equal, round and reactive to light and accommodation. Extraocular tracking is good without limitation to gaze excursion or nystagmus noted. Face is symmetric with normal  facial animation and normal facial sensation.    Normal smooth pursuit is noted. Hearing is impaired. Hearing aid in place.   Speech is clear with no dysarthria noted. There is no hypophonia. There is no lip, neck/head, jaw or voice tremor. Neck is supple with full range of passive and active motion. There are no carotid bruits on auscultation.   Oropharynx exam reveals: mild mouth dryness, adequate dental hygiene with several missing teeth and moderate airway crowding. Mallampati is class II. Tongue protrudes centrally and palate elevates symmetrically.    In addition, shoulder shrug is normal with equal shoulder height noted.   Motor exam: Normal bulk, strength and tone is noted. There is no drift, tremor or rebound.  Romberg is negative. Reflexes are 1+ in the upper  extremities and absent in the lower extremities.  Fine motor skills are intact with normal finger taps, normal hand movements. Cerebellar testing shows no dysmetria or intention tremor on finger to nose testing. No problems turning are noted. He turns en bloc. Tandem walk is difficult for him.               Assessment and Plan:   In summary, [name] is a very pleasant 68 y.o.-year old male with a history of narcolepsy and obstructive sleep apnea.   He is currently not on CPAP therapy but would certainly benefit. I have counseled him at length today about the importance of being compliant with CPAP therapy and try to get back on track with treatment. He indicated that he would be willing to get back on CPAP therapy. To that end, I have ordered another CPAP treatment unit for him and we will fax this to his existing DME company[facility].   As far as his narcolepsy goes he is well-maintained without side effects on his current medication regimen which includes Xyrem 4.5 and 3 g each night and Adderall long-acting 30 mg once daily. He  needs refills today. Since he is stable he can followup in 6 months.   I  have asked him to have CMET drawn today and allow [place] to retest him for OSA- He has trouble to afford the co-pay and monthly rental for the machine. He was titrated to 9 cm water in  03-05-11 and had an AHI of 20.   Based on the recent liver surgery will need to review LFT every 3 month if patient stays on XYREM.

## 2013-12-07 ENCOUNTER — Ambulatory Visit: Payer: Medicare HMO | Admitting: Family Medicine

## 2013-12-28 ENCOUNTER — Telehealth: Payer: Self-pay

## 2013-12-28 NOTE — Telephone Encounter (Signed)
Pt has [employer]. Pt states Dr Carlota Raspberry is his PCP and has sent him to Dr Lenn Sink for his feet and [facility] in the past. [facility] Rossetti is now requiring referrals for these specialists.   Please contact pt with questions  Bf

## 2014-01-01 ENCOUNTER — Encounter (INDEPENDENT_AMBULATORY_CARE_PROVIDER_SITE_OTHER): Payer: Self-pay | Admitting: General Surgery

## 2014-01-01 ENCOUNTER — Ambulatory Visit (INDEPENDENT_AMBULATORY_CARE_PROVIDER_SITE_OTHER): Payer: Commercial Managed Care - HMO | Admitting: General Surgery

## 2014-01-01 VITALS — BP 135/80 | HR 74 | Temp 98.1°F | Resp 16 | Ht 69.5 in | Wt 222.4 lb

## 2014-01-01 DIAGNOSIS — C220 Liver cell carcinoma: Secondary | ICD-10-CM

## 2014-01-01 DIAGNOSIS — C228 Malignant neoplasm of liver, primary, unspecified as to type: Secondary | ICD-10-CM

## 2014-01-01 NOTE — Progress Notes (Signed)
HISTORY: Patient is a 68 year old male with a diagnosis of hepatocellular cancer.  He underwent partial left hepatectomy 02/2013.  He is having some occasional stinging pain in the right lower abdominal wall.  He denies constipation.  He was having itching in his scar at last visit.  He has been applying lotion to the scar and this is better.  He denies nausea or vomiting.  He saw Dr. Benay Spice in January and normal AFP.     PERTINENT REVIEW OF SYSTEMS: Otherwise negative x 11  Filed Vitals:   01/01/14 1126  BP: 135/80  Pulse: 74  Temp: 98.1 F (36.7 C)  Resp: 16   Filed Weights   01/01/14 1126  Weight: 222 lb 6.4 oz (100.88 kg)     EXAM: Head: Normocephalic and atraumatic.  Eyes:  Conjunctivae are normal. Pupils are equal, round, and reactive to light. No scleral icterus.  Neck:  Normal range of motion. Neck supple. No tracheal deviation present. No thyromegaly present.  Resp: No respiratory distress, normal effort. Abd:  Abdomen is soft, non distended and non tender. No masses are palpable.  There is no rebound and no guarding.   No hernia present at the incision. There is no evidence of abnormality in the RLQ at the site of pain.   Neurological: Alert and oriented to person, place, and time. Coordination normal.  Skin: Skin is warm and dry. No rash noted. No diaphoretic. No erythema. No pallor.  Psychiatric: Normal mood and affect. Normal behavior. Judgment and thought content normal.      ASSESSMENT AND PLAN:   Cancer, hepatocellular No clinical evidence of disease.    Pt has follow up arranged with Dr. Benay Spice in July.  Labs/scans ordered then.  I will see him back in around 9 months in order to alternate with Dr. Benay Spice.     Milus Height, MD Surgical Oncology, [facility]  Wendie Agreste, MD Wendie Agreste, MD

## 2014-01-01 NOTE — Patient Instructions (Signed)
Follow up in January 2015.    See Dr. Benay Spice this summer.  Get CT and labs.

## 2014-01-01 NOTE — Assessment & Plan Note (Signed)
No clinical evidence of disease.    Pt has follow up arranged with Dr. Benay Spice in July.  Labs/scans ordered then.  I will see him back in around 9 months in order to alternate with Dr. Benay Spice.

## 2014-01-18 ENCOUNTER — Ambulatory Visit (INDEPENDENT_AMBULATORY_CARE_PROVIDER_SITE_OTHER): Payer: Medicare HMO | Admitting: Family Medicine

## 2014-01-18 ENCOUNTER — Encounter: Payer: Self-pay | Admitting: Family Medicine

## 2014-01-18 VITALS — BP 140/68 | HR 74 | Resp 18 | Wt 221.0 lb

## 2014-01-18 DIAGNOSIS — G4733 Obstructive sleep apnea (adult) (pediatric): Secondary | ICD-10-CM

## 2014-01-18 DIAGNOSIS — C228 Malignant neoplasm of liver, primary, unspecified as to type: Secondary | ICD-10-CM

## 2014-01-18 DIAGNOSIS — E785 Hyperlipidemia, unspecified: Secondary | ICD-10-CM

## 2014-01-18 DIAGNOSIS — I10 Essential (primary) hypertension: Secondary | ICD-10-CM

## 2014-01-18 DIAGNOSIS — C220 Liver cell carcinoma: Secondary | ICD-10-CM

## 2014-01-18 DIAGNOSIS — D490 Neoplasm of unspecified behavior of digestive system: Secondary | ICD-10-CM

## 2014-01-18 DIAGNOSIS — E119 Type 2 diabetes mellitus without complications: Secondary | ICD-10-CM

## 2014-01-18 DIAGNOSIS — N400 Enlarged prostate without lower urinary tract symptoms: Secondary | ICD-10-CM

## 2014-01-18 DIAGNOSIS — J309 Allergic rhinitis, unspecified: Secondary | ICD-10-CM

## 2014-01-18 DIAGNOSIS — G47419 Narcolepsy without cataplexy: Secondary | ICD-10-CM

## 2014-01-18 DIAGNOSIS — L821 Other seborrheic keratosis: Secondary | ICD-10-CM

## 2014-01-18 LAB — POCT GLYCOSYLATED HEMOGLOBIN (HGB A1C): Hemoglobin A1C: 9.9

## 2014-01-18 LAB — GLUCOSE, POCT (MANUAL RESULT ENTRY): POC Glucose: 275 mg/dl — AB (ref 70–99)

## 2014-01-18 MED ORDER — SITAGLIPTIN PHOSPHATE 100 MG PO TABS: 100.0000 mg | ORAL_TABLET | Freq: Every day | ORAL | Status: DC

## 2014-01-18 NOTE — Patient Instructions (Addendum)
The area on your leg looks like a benign growth called a seborrheic keratosis, but if any enlargement or changes in next two months - return for possible biopsy.  Claritin over the counter for allergies.   Keep appointments with other providers.   recheck with me in 3 months. Keep a record of your blood pressures and blood sugars outside of the office and bring them to the next office visit. Pressure is borderline today, but if runs over 150/90 - may need to change your medicines.  Blood sugar worse - add new medicine - Januvia once per day.   Return to the clinic or go to the nearest emergency room if any of your symptoms worsen or new symptoms occur.

## 2014-01-18 NOTE — Progress Notes (Signed)
Subjective:    Patient ID: [name], male    DOB: [birth year], 68 y.o.   MRN: [id number]  HPI [name] is a 68 y.o. male  Here for follow up. Also needs referrals to prior providers due to change in health care coverage - with [facility].   History of a hepatocellular carcinoma, diagnosed in 2014. Status post partial left hepatectomy on 03/02/13. Was referred to oncology. He was stage 1 (T1NX) plan on repeating CT scan of abdomen at six month interval and AFP at six months for few small lymph nodes, but without definite signs of residual or recurrent disease. AFP ok in January, and followed by Dr. Learta Codding.  Last surgical appt with Dr. Barry [name] last month - "No clinical evidence of disease. Pt has follow up arranged with Dr. Benay Spice in July. Labs/scans ordered then. I will see him back in around 9 months in order to alternate with Dr. Benay Spice". Thinks has liver test ordered in next month or two.   OSA  - followed by Dr. Brett Fairy - not on CPAP therapy at last visit in March, but reordered and plan to restart.DME company[facility]. Retest for OSA- He was titrated to 9 cm water in 03-05-11 and had an AHI of 20. Not using CPAP yet due to cost - Dr. Brett Fairy is looking in to this per patient.   Narcolepsy - followed by Dr. Brett Fairy, last ov 10/2013 well-maintained without side effects on Xyrem each night and Adderall long-acting 30 mg once daily.  followup in 6 months. Plan for CMP Q 3 months due to liver surgery and use of Xyrem.   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 Seen by Dr. Terrence Dupont prior.  He was last seen by Cardiology few weeks ago. Denied any changes, with plan to be seen again in 6 months. Takes toprol, losartan, norvasc per med list, not taking HCTZ, is taking spirinolactone., ASA 81mg  qd.  Home blood pressures - unknown exact number, but thinks they have been controlled. No chest pains, not lightheaded, no new side effects.     Chemistry      Component  Value Date/Time   NA 137 11/11/2013 1300   NA 138 08/31/2013 0808   NA 140 06/01/2013 1500   K 4.5 11/11/2013 1300   K 4.2 08/31/2013 0808   CL 100 11/11/2013 1300   CO2 24 11/11/2013 1300   CO2 23 08/31/2013 0808   BUN 10 11/11/2013 1300   BUN 12.0 08/31/2013 0808   BUN 12 06/01/2013 1500   CREATININE 0.95 11/11/2013 1300   CREATININE 1.0 08/31/2013 0808   CREATININE 1.19 06/01/2013 1500      Component Value Date/Time   CALCIUM 9.4 11/11/2013 1300   CALCIUM 9.3 08/31/2013 0808   ALKPHOS 98 11/11/2013 1300   ALKPHOS 68 08/31/2013 0808   AST 38 11/11/2013 1300   AST 28 08/31/2013 0808   ALT 32 11/11/2013 1300   ALT 29 08/31/2013 0808   BILITOT 0.5 11/11/2013 1300   BILITOT 0.60 08/31/2013 0808       Diabetes - last A1c elevated, but had been on prednisone - suspected decreased control due to this. Continued metformin 1000mg  BID.  Home blood sugars: occasional elevation if eating dessert, but usually around 140, no symptomatic lows.  Lab Results  Component Value Date   HGBA1C 8.0* 09/07/2013    BPH - prescribed Uroxatral by Dr. Janice Norrie. Needs new referral for continuing care.   Small growth on outside  of leg - thinks there about 2 months, not sure.    Review of Systems  Constitutional: Negative for fatigue and unexpected weight change.  Eyes: Negative for visual disturbance.  Respiratory: Negative for cough, chest tightness and shortness of breath.   Cardiovascular: Negative for chest pain, palpitations and leg swelling.  Gastrointestinal: Negative for abdominal pain and blood in stool.  Genitourinary: Negative for hematuria and difficulty urinating.  Neurological: Negative for dizziness, light-headedness and headaches.       Objective:   Physical Exam  Vitals reviewed. Constitutional: He is oriented to person, place, and time. He appears well-developed and well-nourished.  HENT:  Head: Normocephalic and atraumatic.  Eyes: EOM are normal. Pupils are equal, round, and reactive to  light.  Neck: No JVD present. Carotid bruit is not present.  Cardiovascular: Normal rate, regular rhythm, normal heart sounds and intact distal pulses.   No murmur heard. Pulmonary/Chest: Effort normal and breath sounds normal. He has no rales.  Abdominal: Soft. There is no tenderness.  Musculoskeletal: He exhibits no edema.  Neurological: He is alert and oriented to person, place, and time.  Microfilament testing of feet normal bilaterally.  Skin: Skin is warm, dry and intact. No rash noted.     Psychiatric: He has a normal mood and affect. His behavior is normal.   Results for orders placed in visit on 01/18/14  GLUCOSE, POCT (MANUAL RESULT ENTRY)      Result Value Ref Range   POC Glucose 275 (*) 70 - 99 mg/dl  POCT GLYCOSYLATED HEMOGLOBIN (HGB A1C)      Result Value Ref Range   Hemoglobin A1C 9.9         Assessment & Plan:   [name] is a 68 y.o. male DM type 2 (diabetes mellitus, type 2) - Plan: Ambulatory referral to Podiatry, Ambulatory referral to Ophthalmology, POCT glucose (manual entry), POCT glycosylated hemoglobin (Hb A1C), sitaGLIPtin (JANUVIA) 100 MG tablet  -uncontrolled, add Januvia, diet adherence discussed, hypoglycemic precautions with new med. rtc in 3 months.   HTN (hypertension) - Plan: Ambulatory referral to Cardiology  -hx of silent MI, followed by cardiology - new referral needed. HTN overall stable for now, check home readings and elevated - rtc.   Tumor of liver, Hepatocellular carcinoma - Plan: Ambulatory referral to General Surgery, Ambulatory referral to Oncology  - as above - stable with follow up scheduled with oncology and surgeon.  Should be having CMP in next month or so as needed approx Q59months on Xyrem per note form neuro. New referrals placed d/t insurance change.    - Plan: Ambulatory referral to General Surgery, Ambulatory referral to Oncology  Hyperlipidemia - Plan: Ambulatory referral to Cardiology  - lipids stable in  January.  Ok to [employer] for now.   OSA (obstructive sleep apnea) - Plan: Ambulatory referral to Neurology   -new referral for insurance reasons.  Encouraged to continue to pursue options to assist in getting CPAP restarted. Followed by neuro.   Narcolepsy - Plan: Ambulatory referral to Neurology  - cont follow up with neuro, Q65mo CMP on Xyrem.   BPH (benign prostatic hyperplasia) - Plan: Ambulatory referral to Urology  -established with urologist, new referral for insurance reasons. Stable, continue alfuzosin.   Seborrheic keratosis  -suspect this is cause of area on leg, but if any increase in size - return for biopsy/removal.   Allergic rhinitis  -start with claritin OTC, consider steroid NS.    Meds ordered this encounter  Medications  .  sitaGLIPtin (JANUVIA) 100 MG tablet    Sig: Take 1 tablet (100 mg total) by mouth daily.    Dispense:  90 tablet    Refill:  1   Patient Instructions  The area on your leg looks like a benign growth called a seborrheic keratosis, but if any enlargement or changes in next two months - return for possible biopsy.  Claritin over the counter for allergies.   Keep appointments with other providers.   recheck with me in 3 months. Keep a record of your blood pressures and blood sugars outside of the office and bring them to the next office visit. Pressure is borderline today, but if runs over 150/90 - may need to change your medicines.  Blood sugar worse - add new medicine - Januvia once per day.   Return to the clinic or go to the nearest emergency room if any of your symptoms worsen or new symptoms occur.

## 2014-01-25 ENCOUNTER — Telehealth: Payer: Self-pay | Admitting: Neurology

## 2014-01-25 DIAGNOSIS — G4733 Obstructive sleep apnea (adult) (pediatric): Secondary | ICD-10-CM

## 2014-01-25 DIAGNOSIS — G47411 Narcolepsy with cataplexy: Secondary | ICD-10-CM

## 2014-01-25 MED ORDER — AMPHETAMINE-DEXTROAMPHET ER 30 MG PO CP24: 30.0000 mg | ORAL_CAPSULE | Freq: Every morning | ORAL | Status: DC

## 2014-01-25 NOTE — Telephone Encounter (Signed)
Patient requesting refill for amphetamine-dextroamphetamine (ADDERALL XR) 30 MG 24 hr capsule.

## 2014-01-25 NOTE — Telephone Encounter (Signed)
Pt is calling requesting refill on Adderall. Please advise

## 2014-01-26 NOTE — Telephone Encounter (Signed)
Called pt and left message informing him that his Rx was ready to be picked up at the front desk and if she has any other problems, questions or concerns to call the office.

## 2014-01-30 ENCOUNTER — Telehealth: Payer: Self-pay | Admitting: Oncology

## 2014-01-30 NOTE — Telephone Encounter (Signed)
[facility] - MOVED 7/17 APPTS TO 7/16. [facility] FOR PT RE APPTS FOR 7/15 AND 7/15. SCHEDULE MAILED.

## 2014-03-03 ENCOUNTER — Other Ambulatory Visit (HOSPITAL_BASED_OUTPATIENT_CLINIC_OR_DEPARTMENT_OTHER): Payer: Commercial Managed Care - HMO

## 2014-03-03 ENCOUNTER — Encounter (HOSPITAL_COMMUNITY): Payer: Self-pay

## 2014-03-03 ENCOUNTER — Ambulatory Visit (HOSPITAL_COMMUNITY)
Admission: RE | Admit: 2014-03-03 | Discharge: 2014-03-03 | Disposition: A | Discharge status: Home / Self Care | Payer: Medicare HMO | Source: Ambulatory Visit | Attending: Nurse Practitioner | Admitting: Nurse Practitioner

## 2014-03-03 DIAGNOSIS — R911 Solitary pulmonary nodule: Secondary | ICD-10-CM | POA: Insufficient documentation

## 2014-03-03 DIAGNOSIS — I8510 Secondary esophageal varices without bleeding: Secondary | ICD-10-CM | POA: Insufficient documentation

## 2014-03-03 DIAGNOSIS — Z9089 Acquired absence of other organs: Secondary | ICD-10-CM | POA: Insufficient documentation

## 2014-03-03 DIAGNOSIS — C220 Liver cell carcinoma: Secondary | ICD-10-CM

## 2014-03-03 DIAGNOSIS — E119 Type 2 diabetes mellitus without complications: Secondary | ICD-10-CM | POA: Diagnosis not present

## 2014-03-03 DIAGNOSIS — K7460 Unspecified cirrhosis of liver: Secondary | ICD-10-CM | POA: Insufficient documentation

## 2014-03-03 DIAGNOSIS — C228 Malignant neoplasm of liver, primary, unspecified as to type: Secondary | ICD-10-CM | POA: Insufficient documentation

## 2014-03-03 DIAGNOSIS — K7689 Other specified diseases of liver: Secondary | ICD-10-CM | POA: Insufficient documentation

## 2014-03-03 DIAGNOSIS — R109 Unspecified abdominal pain: Secondary | ICD-10-CM | POA: Insufficient documentation

## 2014-03-03 LAB — COMPREHENSIVE METABOLIC PANEL (CC13)
ALT: 32 U/L (ref 0–55)
AST: 36 U/L — ABNORMAL HIGH (ref 5–34)
Albumin: 3.7 g/dL (ref 3.5–5.0)
Alkaline Phosphatase: 77 U/L (ref 40–150)
Anion Gap: 9 mEq/L (ref 3–11)
BUN: 11 mg/dL (ref 7.0–26.0)
CO2: 21 mEq/L — ABNORMAL LOW (ref 22–29)
Calcium: 9.2 mg/dL (ref 8.4–10.4)
Chloride: 108 mEq/L (ref 98–109)
Creatinine: 1 mg/dL (ref 0.7–1.3)
Glucose: 142 mg/dl — ABNORMAL HIGH (ref 70–140)
Potassium: 3.9 mEq/L (ref 3.5–5.1)
Sodium: 139 mEq/L (ref 136–145)
Total Bilirubin: 0.75 mg/dL (ref 0.20–1.20)
Total Protein: 7.8 g/dL (ref 6.4–8.3)

## 2014-03-03 LAB — AFP TUMOR MARKER: AFP-Tumor Marker: 2.9 ng/mL (ref 0.0–8.0)

## 2014-03-03 MED ORDER — IOHEXOL 300 MG/ML  SOLN
100.0000 mL | Freq: Once | INTRAMUSCULAR | Status: AC | PRN
  Administered 2014-03-03: 100 mL via INTRAVENOUS

## 2014-03-04 ENCOUNTER — Ambulatory Visit (HOSPITAL_BASED_OUTPATIENT_CLINIC_OR_DEPARTMENT_OTHER): Payer: Commercial Managed Care - HMO | Admitting: Oncology

## 2014-03-04 ENCOUNTER — Telehealth: Payer: Self-pay | Admitting: Oncology

## 2014-03-04 ENCOUNTER — Encounter: Payer: Self-pay | Admitting: Oncology

## 2014-03-04 VITALS — BP 140/72 | HR 71 | Temp 98.6°F | Resp 18 | Ht 69.5 in | Wt 221.2 lb

## 2014-03-04 DIAGNOSIS — C228 Malignant neoplasm of liver, primary, unspecified as to type: Secondary | ICD-10-CM

## 2014-03-04 DIAGNOSIS — R911 Solitary pulmonary nodule: Secondary | ICD-10-CM

## 2014-03-04 DIAGNOSIS — E119 Type 2 diabetes mellitus without complications: Secondary | ICD-10-CM

## 2014-03-04 DIAGNOSIS — B1910 Unspecified viral hepatitis B without hepatic coma: Secondary | ICD-10-CM

## 2014-03-04 DIAGNOSIS — K7460 Unspecified cirrhosis of liver: Secondary | ICD-10-CM | POA: Diagnosis not present

## 2014-03-04 DIAGNOSIS — R599 Enlarged lymph nodes, unspecified: Secondary | ICD-10-CM | POA: Diagnosis not present

## 2014-03-04 DIAGNOSIS — C220 Liver cell carcinoma: Secondary | ICD-10-CM

## 2014-03-04 NOTE — Telephone Encounter (Signed)
gv pt appt schedule for jan 2016. [facility] will call pt re ct appt - pt aware.

## 2014-03-04 NOTE — Progress Notes (Signed)
[facility] OFFICE PROGRESS NOTE   Diagnosis: Hepatocellular carcinoma  INTERVAL HISTORY:   Mr. Colledge returns as scheduled. He feels well. Good appetite. No specific complaint.  Objective:  Vital signs in last 24 hours:  Blood pressure 140/72, pulse 71, temperature 98.6 F (37 C), temperature source Oral, resp. rate 18, height 5' 9.5" (1.765 m), weight 221 lb 3.2 oz (100.336 kg).    HEENT: Neck without mass Lymphatics: No cervical, supraclavicular, axillary, or inguinal nodes Resp: Lungs clear bilaterally Cardio: Regular rate and rhythm GI: No hepatosplenomegaly, nontender, no mass Vascular: Trace low pretibial edema bilaterally  Skin: 3-4 mm raised rounded hyperpigmented papule at the right lower leg     Lab Results:  AFP 03/03/2014- 2.9  Imaging:  Ct Abdomen W Contrast  03/03/2014   CLINICAL DATA:  Abdominal pain. Follow-up of hepatocellular carcinoma. Partial hepatectomy.  EXAM: CT ABDOMEN WITH CONTRAST  TECHNIQUE: Multidetector CT imaging of the abdomen was performed using the standard protocol following bolus administration of intravenous contrast.  CONTRAST:  164mL OMNIPAQUE IOHEXOL 300 MG/ML  SOLN  COMPARISON:  08/31/2013  FINDINGS: Lower Chest: Anterior right lung base 5 mm nodule on image 2/ series 4 is unchanged. Interstitial thickening at the lung bases is similar to slightly progressive.  Normal heart size without pericardial or pleural effusion. Small esophageal varices. Right cardiophrenic angle node measures 11 mm on image 6, unchanged.  Abdomen: Cirrhosis. Postsurgical changes about the anterior aspect of the left lobe of the liver. No gross evidence of locally recurrent disease. Postoperative fluid collection is nearly completely resolved. Mild residual interstitial thickening and minimal subcapsular hypoattenuation on image 14/series 2. Patent hepatic and portal veins. Decrease sensitivity exam secondary to lack of arterial phase imaging.  Suspicion of mild hepatic steatosis.  Normal spleen, stomach, pancreas, gallbladder, biliary tract, adrenal glands, left kidney. Too small to characterize interpolar right renal lesion. Dense aortic and branch vessel atherosclerosis. No retroperitoneal or retrocrural adenopathy. No porta hepatis adenopathy. Normal abdominal large and small bowel loops, without ascites. No evidence of omental or peritoneal disease.  Bones/Musculoskeletal:  No acute osseous abnormality.  IMPRESSION: 1. Surgical changes about the left lobe of the liver, with decreased to resolved overlying postoperative fluid collection. No evidence of locally recurrent or metastatic disease within the abdomen. 2. Similar right cardiophrenic angle adenopathy. Favored to be reactive. 3. Cirrhosis and portal venous hypertension with probable mild hepatic steatosis. Please note that arterial phase imaging was not performed. This decreases sensitivity for recurrent or metachronous hepatocellular carcinoma 4. Similar anterior right lung base nodule. Recommend attention on follow-up. 5. Suspect mild fibrosis of the lung bases, similar to minimally increased. Correlate with pulmonary symptoms and possibly dedicated high-resolution chest CT.   Electronically Signed   By: Abigail Miyamoto M.D.   On: 03/03/2014 10:10    Medications: I have reviewed the patient's current medications.  Assessment/Plan: 1. Hepatocellular carcinoma, stage I (T1 NX) status post partial left hepatectomy 03/02/2013.  08/31/2013 AFP 2.4.  CT abdomen 08/31/2013 with postoperative changes of partial hepatectomy with small postoperative fluid collection along the resection margin. No definite signs to suggest residual or locally recurrent disease. Several borderline enlarged and minimally enlarged lymph nodes superior to the liver in the juxtapericardiac fat of the lower anterior mediastinum with the largest measuring 11 mm slightly increased compared to the prior study 01/15/2013.  Several prominent non-pathologically enlarged upper abdominal ligament lymph nodes similar to the prior study. Small esophageal varices.  CT abdomen 03/03/2012 without evidence of  recurrent hepatocellular carcinoma, stable right lung base nodule 2. Cirrhosis. 3. Hepatitis B core antibody positive. 4. Diabetes.    Disposition:  [name] remains in clinical remission from hepatocellular carcinoma. He will return for an office visit, AFP, and restaging CT in 6 months.  Betsy Coder, MD  03/04/2014  10:00 AM

## 2014-03-05 ENCOUNTER — Ambulatory Visit: Payer: Medicare HMO | Admitting: Oncology

## 2014-04-12 ENCOUNTER — Other Ambulatory Visit: Payer: Self-pay | Admitting: Neurology

## 2014-04-12 DIAGNOSIS — G47411 Narcolepsy with cataplexy: Secondary | ICD-10-CM

## 2014-04-12 DIAGNOSIS — G4733 Obstructive sleep apnea (adult) (pediatric): Secondary | ICD-10-CM

## 2014-04-12 MED ORDER — AMPHETAMINE-DEXTROAMPHET ER 30 MG PO CP24: 30.0000 mg | ORAL_CAPSULE | Freq: Every morning | ORAL | Status: DC

## 2014-04-12 NOTE — Telephone Encounter (Signed)
Request forwarded to provider for approval  

## 2014-04-13 NOTE — Telephone Encounter (Signed)
Called pt and left message informing pt that his Rx was ready to be picked up at the front desk and if he has any other problems, questions or concerns to call the office.

## 2014-04-19 ENCOUNTER — Encounter: Payer: Self-pay | Admitting: Family Medicine

## 2014-04-19 ENCOUNTER — Ambulatory Visit (INDEPENDENT_AMBULATORY_CARE_PROVIDER_SITE_OTHER): Payer: Medicare HMO | Admitting: Family Medicine

## 2014-04-19 VITALS — BP 128/77 | HR 75 | Temp 98.0°F | Resp 16 | Ht 68.5 in | Wt 213.0 lb

## 2014-04-19 DIAGNOSIS — G4733 Obstructive sleep apnea (adult) (pediatric): Secondary | ICD-10-CM

## 2014-04-19 DIAGNOSIS — E1165 Type 2 diabetes mellitus with hyperglycemia: Secondary | ICD-10-CM

## 2014-04-19 DIAGNOSIS — I10 Essential (primary) hypertension: Secondary | ICD-10-CM

## 2014-04-19 DIAGNOSIS — Z23 Encounter for immunization: Secondary | ICD-10-CM

## 2014-04-19 DIAGNOSIS — IMO0001 Reserved for inherently not codable concepts without codable children: Secondary | ICD-10-CM

## 2014-04-19 DIAGNOSIS — E119 Type 2 diabetes mellitus without complications: Secondary | ICD-10-CM

## 2014-04-19 DIAGNOSIS — IMO0002 Reserved for concepts with insufficient information to code with codable children: Secondary | ICD-10-CM

## 2014-04-19 LAB — POCT GLYCOSYLATED HEMOGLOBIN (HGB A1C): Hemoglobin A1C: 7.2

## 2014-04-19 LAB — GLUCOSE, POCT (MANUAL RESULT ENTRY): POC Glucose: 120 mg/dl — AB (ref 70–99)

## 2014-04-19 MED ORDER — SPIRONOLACTONE 25 MG PO TABS: 25.0000 mg | ORAL_TABLET | Freq: Two times a day (BID) | ORAL | Status: DC

## 2014-04-19 MED ORDER — LOSARTAN POTASSIUM 25 MG PO TABS: 25.0000 mg | ORAL_TABLET | Freq: Every day | ORAL | Status: DC

## 2014-04-19 MED ORDER — METOPROLOL SUCCINATE ER 50 MG PO TB24: 50.0000 mg | ORAL_TABLET | Freq: Every day | ORAL | Status: DC

## 2014-04-19 NOTE — Progress Notes (Signed)
Subjective:  This chart was scribed for Edward Ray, MD by Donato Schultz, Medical Scribe. This patient was seen in Room 25 and the patient's care was started at 2:14 PM.   Patient ID: [name], male    DOB: 1946-07-11, 68 y.o.   MRN: [id number]  HPI HPI Comments: [name] is a 68 y.o. male who presents to the [facility] for a 3 month DM follow-up and medication refill.    Type II DM - Last A1c from June 1st was 9.9.  Added Januvia 100mg  QD.  Weight was 221 at that visit and is down to 213 today.  He checks his blood sugar at home regularly.  His sugar levels have been better.  He has not been hypoglycemic.    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  He checks his blood pressure at home and his numbers will run 103/70.  He denies lightheadedness, chest pain, and SOB as associated symptoms.    OSA - He is followed by a sleep specialist.  Advised to restart CPAP at last visit.  He is also followed by the neurology for narcolepsy.  He is not using the CPAP machine because he needs a new machine.  Hepatocellular carcinoma - Last visit with Dr. Benay Spice was July 16th.  Clinically in remission.  Plan for repeat AFP and CT scan in 6 months.    Health maintenance:  Flu vaccine - He has not had a flu vaccine this year but would like to receive it today.   He has no new side effects from his medications.  He is no loner taking Hydrochlorothiazide.    Patient Active Problem List   Diagnosis Date Noted  . 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 Altered mental status 03/05/2013  . Respiratory failure, post-operative 03/02/2013  . Hepatitis B 02/24/2013  . Cancer, hepatocellular 02/10/2013  . Liver tumor-bleeding 01/15/2013  . Epidermal cyst 06/18/2012  . OSA (obstructive sleep apnea) 03/13/2012  . Abnormal leg movement 12/12/2011  . Blood pressure elevated 09/06/2011  . Diabetes mellitus  09/06/2011  . 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 BPH (benign prostatic hyperplasia) 09/06/2011  . Diverticula of colon 09/06/2011   Past Medical History  Diagnosis Date  . Diabetes mellitus without complication   . Hypertension   . Hyperlipidemia   . Cancer     hepatocellular cancer   . Hepatitis   . Narcolepsy     per office visit note of 08/2011   . Sleep apnea    Past Surgical History  Procedure Laterality Date  . Pinched nerve in back    . Laparoscopy  03/02/2013    Procedure: LAPAROSCOPY DIAGNOSTIC;  Surgeon: [name], MD;  Location: [facility];  Service: General;;  . Liver ultrasound  03/02/2013    Procedure: LIVER ULTRASOUND;  Surgeon: [name], MD;  Location: [facility];  Service: General;;  . Open partial hepatectomy [83]  03/02/2013    Procedure: OPEN PARTIAL HEPATECTOMY [83];  Surgeon: [name], MD;  Location: [facility];  Service: General;;  DX LAPAROSCOPY, INTRAOPERATIVE LIVER ULTRASOUND, OPEN PARTIAL HEPATECTOMY   Allergies  Allergen Reactions  . Ace Inhibitors Swelling    Angioedema - face.    Prior to Admission medications   Medication Sig Start Date End Date Taking? Authorizing Provider  alfuzosin (UROXATRAL) 10 MG 24 hr tablet Take 10 mg by mouth at bedtime.  12/19/12  Yes Historical Provider, MD  amLODipine (NORVASC) 5 MG tablet Take 1 tablet (5 mg total) by mouth daily. 09/07/13  Yes Wendie Agreste, MD  amphetamine-dextroamphetamine (ADDERALL XR) 30 MG 24 hr capsule Take 1 capsule (30 mg total) by mouth every morning. 04/12/14  Yes Larey Seat, MD  aspirin 81 MG tablet Take 81 mg by mouth every morning.    Yes Historical Provider, MD  hydrochlorothiazide (HYDRODIURIL) 12.5 MG tablet Take 1 tablet (12.5 mg total) by mouth daily. 09/07/13  Yes Wendie Agreste, MD  losartan (COZAAR) 25 MG tablet Take 1 tablet (25 mg total) by mouth daily. 09/07/13 09/07/14 Yes Wendie Agreste, MD  metFORMIN (GLUCOPHAGE) 1000 MG tablet TAKE ONE TABLET BY MOUTH TWICE DAILY WITH A MEAL  09/07/13  Yes Wendie Agreste, MD  metoprolol succinate (TOPROL-XL) 50 MG 24 hr tablet Take 1 tablet (50 mg total) by mouth daily. 09/07/13  Yes Wendie Agreste, MD  pravastatin (PRAVACHOL) 20 MG tablet Take 1 tablet (20 mg total) by mouth at bedtime. 09/07/13  Yes Wendie Agreste, MD  sitaGLIPtin (JANUVIA) 100 MG tablet Take 1 tablet (100 mg total) by mouth daily. 01/18/14  Yes Wendie Agreste, MD  Sodium Oxybate 500 MG/ML SOLN Take 4,500 mg by mouth 2 (two) times daily. 11/11/13  Yes Asencion Partridge Dohmeier, MD  spironolactone (ALDACTONE) 25 MG tablet Take 1 tablet (25 mg total) by mouth 2 (two) times daily. 09/07/13  Yes Wendie Agreste, MD   History   Social History  . Marital Status: Single    Spouse Name: N/A    Number of Children: 2  . Years of Education: 15+   Occupational History  . Not on file.   Social History Main Topics  . Smoking status: Former Research scientist (life sciences)  . Smokeless tobacco: Never Used  . Alcohol Use: No  . Drug Use: No  . Sexual Activity: Yes   Other Topics Concern  . Not on file   Social History Narrative   Patient is single and lives alone- became widower when 1 child was 78 year old, divorced 2nd marriage   Has #2 grown children, not in area or involved in his life   Patient is retired.   Patient has a college education.   Patient is right-handed.   Patient drinks maybe two sodas daily.     Review of Systems  HENT: Negative for congestion, rhinorrhea, sinus pressure and trouble swallowing.   Respiratory: Negative for cough, chest tightness, shortness of breath and wheezing.   Cardiovascular: Negative for chest pain and palpitations.  Neurological: Negative for dizziness, light-headedness and headaches.  All other systems reviewed and are negative.    Objective:  Physical Exam  Vitals reviewed. Constitutional: He is oriented to person, place, and time. He appears well-developed and well-nourished.  HENT:  Head: Normocephalic and atraumatic.  Right Ear:  Tympanic membrane, external ear and ear canal normal.  Left Ear: Tympanic membrane, external ear and ear canal normal.  Nose: No rhinorrhea.  Mouth/Throat: Oropharynx is clear and moist and mucous membranes are normal. No oropharyngeal exudate or posterior oropharyngeal erythema.  Eyes: Conjunctivae are normal. Pupils are equal, round, and reactive to light.  Neck: Neck supple.  Cardiovascular: Normal rate, regular rhythm, normal heart sounds and intact distal pulses.  Exam reveals no gallop and no friction rub.   No murmur heard. Pulmonary/Chest: Effort normal and breath sounds normal. No respiratory distress. He has no wheezes. He has no rhonchi. He has no rales.  Abdominal: Soft. There is  no tenderness.  Musculoskeletal: He exhibits no edema.  Lymphadenopathy:    He has no cervical adenopathy.  Neurological: He is alert and oriented to person, place, and time.  Skin: Skin is warm and dry. No rash noted.  Psychiatric: He has a normal mood and affect. His behavior is normal.     Results for orders placed in visit on 04/19/14  GLUCOSE, POCT (MANUAL RESULT ENTRY)      Result Value Ref Range   POC Glucose 120 (*) 70 - 99 mg/dl  POCT GLYCOSYLATED HEMOGLOBIN (HGB A1C)      Result Value Ref Range   Hemoglobin A1C 7.2      Filed Vitals:   04/19/14 1344  BP: 128/77  Pulse: 75  Temp: 98 F (36.7 C)  Resp: 16  Height: 5' 8.5" (1.74 m)  Weight: 213 lb (96.616 kg)  SpO2: 98%   Assessment & Plan:   PRANSHU LYSTER is a 68 y.o. male DM (diabetes mellitus), type 2,  -improved. No changes in meds, commended and encouraged on continued diet and weight management. Recheck in 3 months.   Need for prophylactic vaccination and inoculation against influenza - Plan: Flu Vaccine QUAD 36+ mos IM given.    Essential hypertension - Plan: losartan (COZAAR) 25 MG tablet, metoprolol succinate (TOPROL-XL) 50 MG 24 hr tablet, spironolactone (ALDACTONE) 25 MG tablet  -stable. Cont same  regimen  OSA (obstructive sleep apnea)  -encouraged to obtain and start CPAP as discussed prior.    Meds ordered this encounter  Medications  . Sodium Oxybate 500 MG/ML SOLN    Sig: Take 4,500 mg by mouth 2 (two) times daily.  [name] Kitchen losartan (COZAAR) 25 MG tablet    Sig: Take 1 tablet (25 mg total) by mouth daily.    Dispense:  90 tablet    Refill:  1  . metoprolol succinate (TOPROL-XL) 50 MG 24 hr tablet    Sig: Take 1 tablet (50 mg total) by mouth daily.    Dispense:  90 tablet    Refill:  1  . spironolactone (ALDACTONE) 25 MG tablet    Sig: Take 1 tablet (25 mg total) by mouth 2 (two) times daily.    Dispense:  180 tablet    Refill:  1   Patient Instructions  Good job on the better diabetes control.  No med changes for now - check into the CPAP machine and start this as soon as you can.  recheck in 3 months.     I personally performed the services described in this documentation, which was scribed in my presence. The recorded information has been reviewed and considered, and addended by me as needed.

## 2014-04-19 NOTE — Patient Instructions (Signed)
Good job on the better diabetes control.  No med changes for now - check into the CPAP machine and start this as soon as you can.  recheck in 3 months.

## 2014-04-23 ENCOUNTER — Other Ambulatory Visit: Payer: Self-pay | Admitting: Family Medicine

## 2014-05-25 IMAGING — CT CT ABDOMEN W/ CM
2 of 8 series · 11 of 46 positions shown, 17 images · IV contrast (OMNIPAQUE)
Comparison: MRI of the abdomen 02/18/2013.

CLINICAL DATA: Hepatocellular carcinoma diagnosed in 7229.

EXAM:
CT ABDOMEN WITH CONTRAST
TECHNIQUE: Multidetector CT imaging of the abdomen was performed using the
standard protocol following bolus administration of intravenous
contrast.
CONTRAST:  100mL OMNIPAQUE IOHEXOL 300 MG/ML  SOLN

[Series 4: liver portal venous · axial · portal-venous · 0.81mm/px · z∈[-259,-49]mm · 8 of 92 slices shown, 13 images]
[im 11/92  soft-tissue]
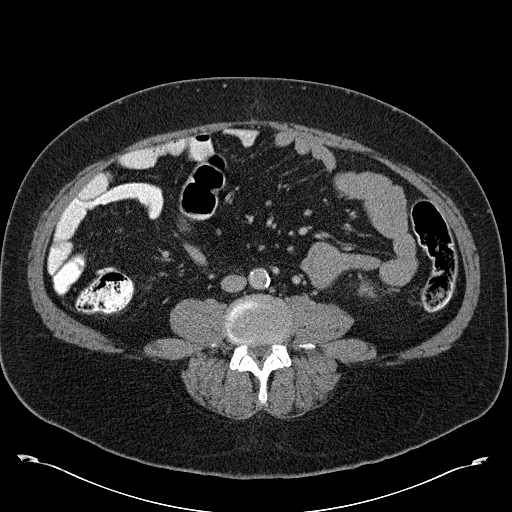
[im 11/92  bone]
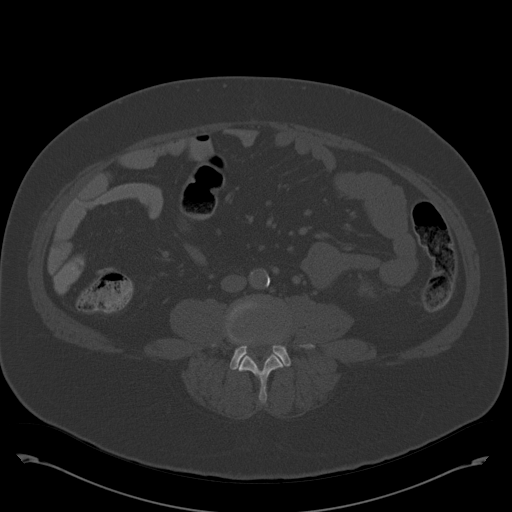
[im 21/92  soft-tissue]
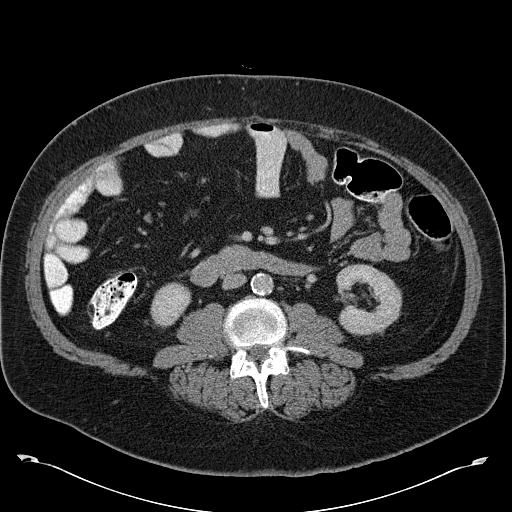
[im 31/92  soft-tissue]
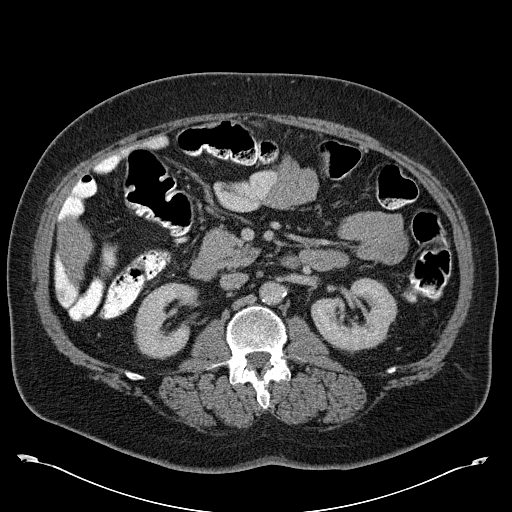
[im 41/92  soft-tissue]
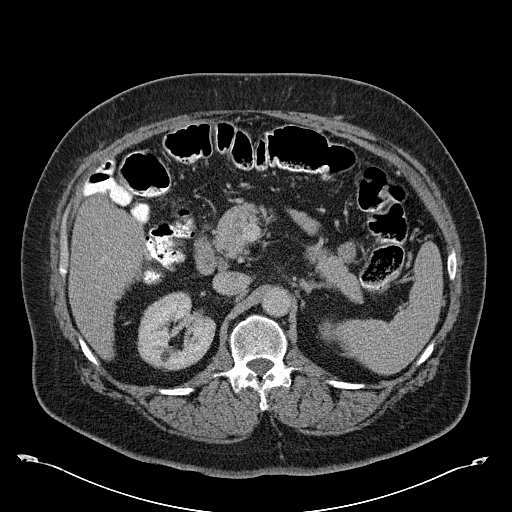
[im 51/92  soft-tissue]
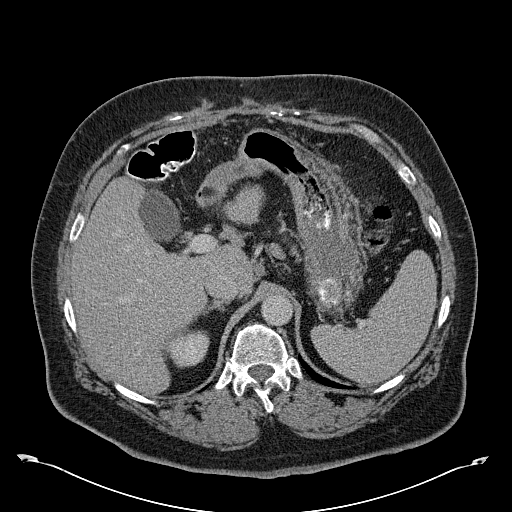
[im 51/92  lung]
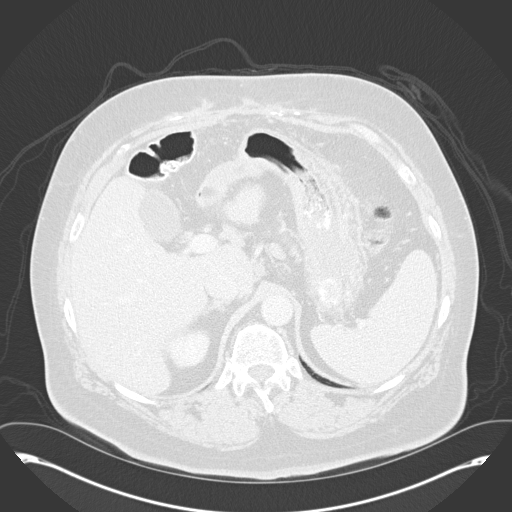
[im 61/92  soft-tissue]
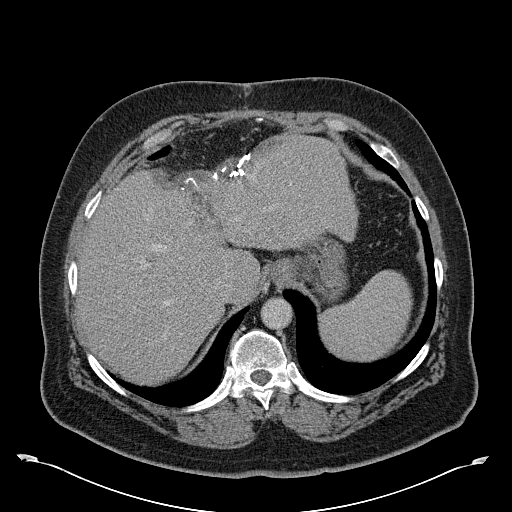
[im 61/92  lung]
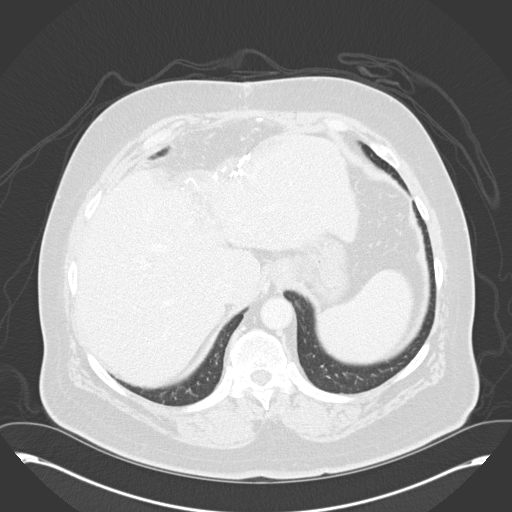
[im 71/92  soft-tissue]
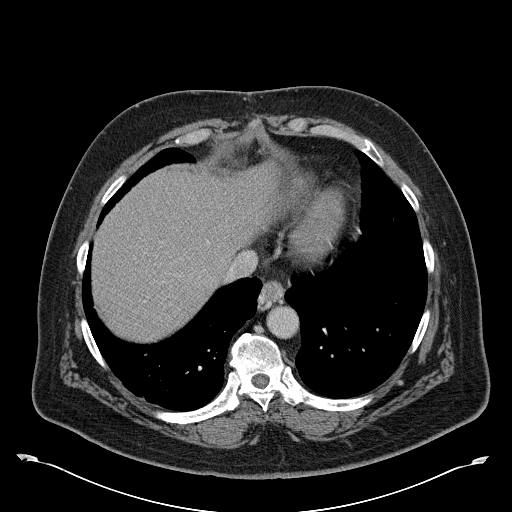
[im 71/92  lung]
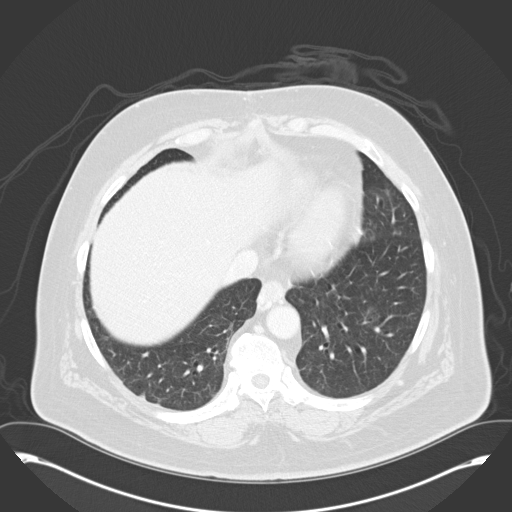
[im 81/92  soft-tissue]
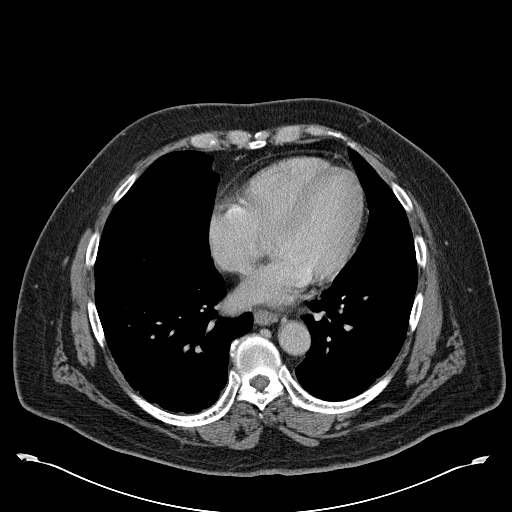
[im 81/92  lung]
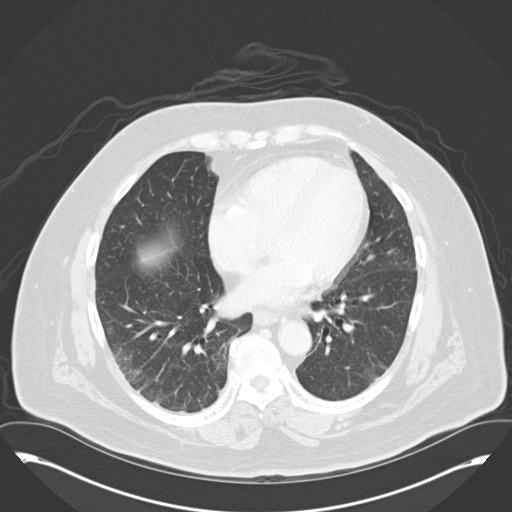

[Series 602: <mpr thick range> · coronal · 0.81mm/px · 3 of 98 slices shown, 4 images]
[im 25/98  soft-tissue]
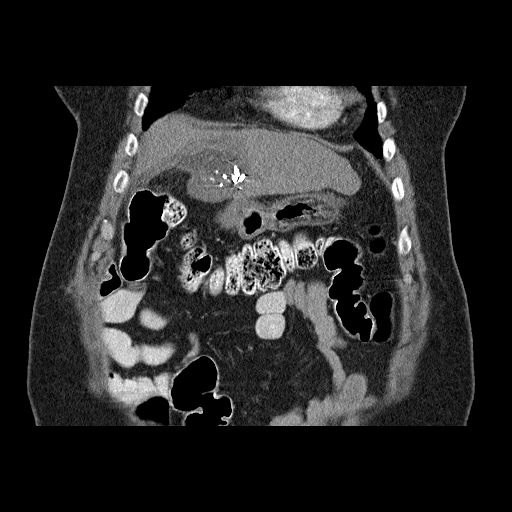
[im 49/98  soft-tissue]
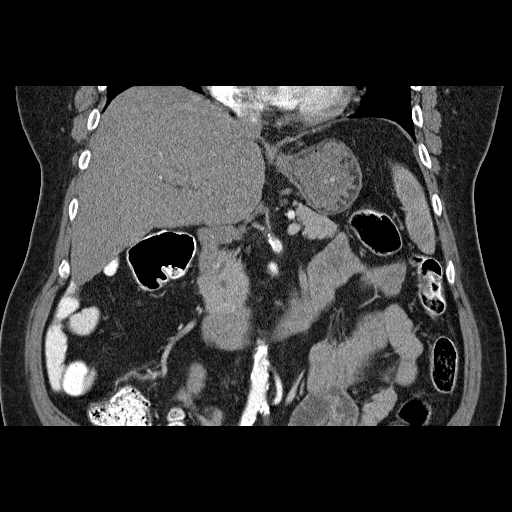
[im 49/98  bone]
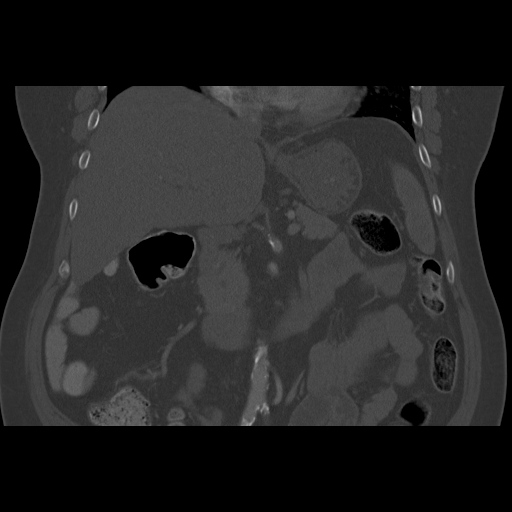
[im 73/98  soft-tissue]
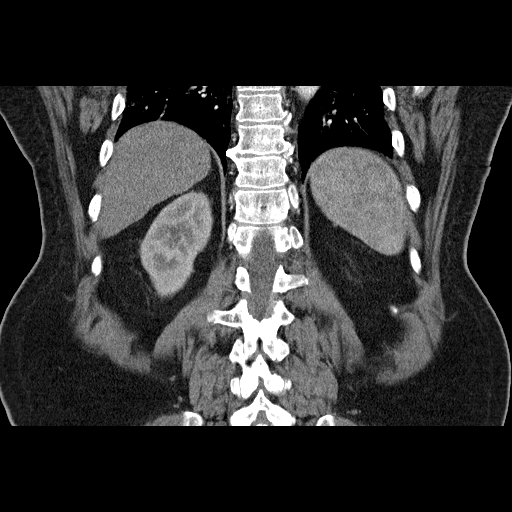

[11 of 46 positions shown; findings below may reference images not displayed]

FINDINGS: Lung Bases: Superior to the liver in the juxtapericardiac fat of the
lower anterior mediastinum there are several borderline enlarged and
minimally enlarged lymph nodes, largest of which measures 11 mm,
slightly increased compared to prior study 01/15/2013. Small
esophageal varices.

Abdomen: Status post partial hepatectomy involving predominantly the
subcapsular aspect of the left lobe of the liver. The previously
noted hypervascular liver mass is no longer confidently identified.
Along the site of resection there is a chronic low-attenuation
postoperative fluid collection, favored to represent a subcapsular
seroma, with some underlying surgical clips along the liver surface.
No definite area of hypervascularity or avid enhancement in the
underlying liver parenchyma is identified at this time to suggest
residual or recurrent disease. No new hepatic lesions are noted.

The appearance of the gallbladder, pancreas, spleen, bilateral
adrenal glands and the left kidney is unremarkable. Sub cm
low-attenuation lesion in the interpolar region of the right kidney
is too small to definitively characterize, but is similar to the
prior study and favored to represent a small cyst. Within the
visualized peritoneal cavity there is no significant volume of
ascites, no pneumoperitoneum and no pathologic distention of small
bowel. No definite lymphadenopathy in the abdomen. Several prominent
but non pathologically enlarged upper abdominal ligament
(gastrohepatic and hepatoduodenal) lymph nodes are similar to the
prior study.

Musculoskeletal: There are no aggressive appearing lytic or blastic
lesions noted in the visualized portions of the skeleton.
IMPRESSION: 1. Postoperative changes of partial hepatectomy with small
postoperative fluid collection along the resection margin, likely to
represent a postoperative subcapsular seroma. No definite signs to
suggest residual or locally recurrent disease.
2. There has been slight interval enlargement of a lymph node in the
a lower anterior mediastinum (juxtapericardiac), which measures only
11 mm. This could certainly be reactive in the setting of recent
upper abdominal surgery, but attention on future followup studies is
recommended. No other definite pathologically enlarged lymph nodes
are noted at this time.
3. Small distal esophageal varices.

## 2014-06-15 ENCOUNTER — Other Ambulatory Visit: Payer: Self-pay | Admitting: Gastroenterology

## 2014-06-21 ENCOUNTER — Encounter: Payer: Self-pay | Admitting: Family Medicine

## 2014-06-21 ENCOUNTER — Ambulatory Visit (INDEPENDENT_AMBULATORY_CARE_PROVIDER_SITE_OTHER): Payer: Commercial Managed Care - HMO | Admitting: Family Medicine

## 2014-06-21 VITALS — BP 116/70 | HR 99 | Temp 98.7°F | Resp 16 | Ht 69.5 in | Wt 204.6 lb

## 2014-06-21 DIAGNOSIS — I10 Essential (primary) hypertension: Secondary | ICD-10-CM

## 2014-06-21 DIAGNOSIS — E119 Type 2 diabetes mellitus without complications: Secondary | ICD-10-CM

## 2014-06-21 DIAGNOSIS — R059 Cough, unspecified: Secondary | ICD-10-CM

## 2014-06-21 DIAGNOSIS — R05 Cough: Secondary | ICD-10-CM

## 2014-06-21 DIAGNOSIS — Z23 Encounter for immunization: Secondary | ICD-10-CM

## 2014-06-21 DIAGNOSIS — G4733 Obstructive sleep apnea (adult) (pediatric): Secondary | ICD-10-CM

## 2014-06-21 DIAGNOSIS — N400 Enlarged prostate without lower urinary tract symptoms: Secondary | ICD-10-CM

## 2014-06-21 LAB — GLUCOSE, POCT (MANUAL RESULT ENTRY): POC Glucose: 242 mg/dl — AB (ref 70–99)

## 2014-06-21 MED ORDER — METFORMIN HCL 1000 MG PO TABS: ORAL_TABLET | ORAL | Status: DC

## 2014-06-21 MED ORDER — GUAIFENESIN ER 1200 MG PO TB12: 1.0000 | ORAL_TABLET | Freq: Two times a day (BID) | ORAL | Status: DC | PRN

## 2014-06-21 MED ORDER — SITAGLIPTIN PHOSPHATE 100 MG PO TABS: 100.0000 mg | ORAL_TABLET | Freq: Every day | ORAL | Status: DC

## 2014-06-21 MED ORDER — BENZONATATE 100 MG PO CAPS: 100.0000 mg | ORAL_CAPSULE | Freq: Three times a day (TID) | ORAL | Status: DC | PRN

## 2014-06-21 NOTE — Patient Instructions (Signed)
Your cough is most likely due to a viral infection. Please take the tessalon and mucinex as prescribed. If you're not better in 4-5 days, please return to clinic for [place] to check you again. Please come back to clinic in 3 months so we can check your labs again and see how you're doing. Please check to see how much the tetanus and prevnar (pneumonia) vaccine would cost.  Keep up the good work with the weight loss!!!

## 2014-06-21 NOTE — Progress Notes (Signed)
Patient discussed and examined with Mr. Rosanne Sack. Agree with assessment and plan of care per his note. New referrals to Dr. Terrence Dupont and Dr. Janice Norrie placed for ongoing care and requirements for insurance.

## 2014-06-21 NOTE — Progress Notes (Signed)
Subjective:    Patient ID: [name], male    DOB: [birth year], 68 y.o.   MRN: [id number]  Wendie Agreste, MD  Chief Complaint  Patient presents with  . Diabetes    follow up  . Medication Refill    metformin  . Cough    x 6 days   Patient Active Problem List   Diagnosis Date Noted  . 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 Altered mental status 03/05/2013  . Respiratory failure, post-operative 03/02/2013  . Hepatitis B 02/24/2013  . Cancer, hepatocellular 02/10/2013  . Liver tumor-bleeding 01/15/2013  . Epidermal cyst 06/18/2012  . OSA (obstructive sleep apnea) 03/13/2012  . Abnormal leg movement 12/12/2011  . Blood pressure elevated 09/06/2011  . Diabetes mellitus 09/06/2011  . 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 BPH (benign prostatic hyperplasia) 09/06/2011  . Diverticula of colon 09/06/2011   Prior to Admission medications   Medication Sig Start Date End Date Taking? Authorizing Provider  alfuzosin (UROXATRAL) 10 MG 24 hr tablet Take 10 mg by mouth at bedtime.  12/19/12  Yes Historical Provider, MD  amLODipine (NORVASC) 5 MG tablet TAKE ONE TABLET BY MOUTH ONCE DAILY 04/26/14  Yes Wendie Agreste, MD  amphetamine-dextroamphetamine (ADDERALL XR) 30 MG 24 hr capsule Take 1 capsule (30 mg total) by mouth every morning. 04/12/14  Yes Larey Seat, MD  aspirin 81 MG tablet Take 81 mg by mouth every morning.    Yes Historical Provider, MD  losartan (COZAAR) 25 MG tablet Take 1 tablet (25 mg total) by mouth daily. 04/19/14 04/19/15 Yes Wendie Agreste, MD  metFORMIN (GLUCOPHAGE) 1000 MG tablet TAKE ONE TABLET BY MOUTH TWICE DAILY WITH A MEAL 09/07/13  Yes Wendie Agreste, MD  metoprolol succinate (TOPROL-XL) 50 MG 24 hr tablet Take 1 tablet (50 mg total) by mouth daily. 04/19/14  Yes Wendie Agreste, MD  pravastatin (PRAVACHOL) 20 MG tablet Take 1 tablet (20 mg total) by mouth at bedtime. 09/07/13  Yes Wendie Agreste, MD  sitaGLIPtin (JANUVIA) 100 MG tablet Take 1 tablet  (100 mg total) by mouth daily. 01/18/14  Yes Wendie Agreste, MD  Sodium Oxybate 500 MG/ML SOLN Take 4,500 mg by mouth 2 (two) times daily. 11/11/13  Yes Asencion Partridge Dohmeier, MD  spironolactone (ALDACTONE) 25 MG tablet Take 1 tablet (25 mg total) by mouth 2 (two) times daily. 04/19/14  Yes Wendie Agreste, MD   Medications, allergies, past medical history, surgical history, family history, social history and problem list reviewed and updated.  HPI  46 yom with PMH DM, HTN, OSA presents for f/u and c/o cough.   Diabetes - Checking BG at home. Checks 2-3 times per week. First thing in morning. Has been running around 115-120 fasting. Continuing to try to watch diet but admits he falls off "every now and then." Walking outdoors 2-3 days per week. Walks about 20-30 minutes per time. No polyuria, polydipsia, dizziness. Taking his 2 diabetes meds as prescribed. Has recently seen both podiatrist and optho.   HTN - Checking BP at home. He thinks it has been running good at home but is having trouble remembering what his numbers were as getting confused with BG readings. Taking his 4 HTN meds as prescribed. 116/70 today.   OSA - Still has not gotten his CPAP. He had a new one but returned it the company as he was told it was ore expensive than he thought. He is concerned about the cost of obtaining another one at this  point.   Cough - Started 6 days ago. Productive with yellow/green sputum whole 6 days. Coughing at night, much less coughing during day. No ear pain, no sore throat, no abd pain, n/v, or diarrhea. No fevers, chills. Has not taken anything for it. Pos head congestion. No hemoptysis. No hx asthma, no wheezing he's noticed, no hx gerd.   Interested in [place] and tetanus vaccines today. Also interested in shingles vaccine.   Review of Systems No chest pain, SOB, HA, dizziness, vision change, N/V, diarrhea, constipation, dysuria, urinary urgency or frequency, myalgias, arthralgias or rash.       Objective:   Physical Exam  Constitutional: He is oriented to person, place, and time. He appears well-developed and well-nourished.  Non-toxic appearance. He does not have a sickly appearance. He does not appear ill. No distress.  BP 116/70 mmHg  Pulse 99  Temp(Src) 98.7 F (37.1 C) (Oral)  Resp 16  Ht 5' 9.5" (1.765 m)  Wt 204 lb 9.6 oz (92.806 kg)  BMI 29.79 kg/m2  SpO2 98%   HENT:  Head: Normocephalic and atraumatic.  Right Ear: Hearing, tympanic membrane, external ear and ear canal normal.  Left Ear: Hearing, tympanic membrane, external ear and ear canal normal.  Nose: Nose normal. No mucosal edema or rhinorrhea. Right sinus exhibits no maxillary sinus tenderness and no frontal sinus tenderness. Left sinus exhibits no maxillary sinus tenderness and no frontal sinus tenderness.  Mouth/Throat: Uvula is midline, oropharynx is clear and moist and mucous membranes are normal. No oropharyngeal exudate, posterior oropharyngeal edema or posterior oropharyngeal erythema.  Cardiovascular: Normal rate, regular rhythm, S1 normal, S2 normal and normal heart sounds.  Exam reveals no gallop.   No murmur heard. Pulses:      Dorsalis pedis pulses are 1+ on the right side, and 1+ on the left side.       Posterior tibial pulses are 1+ on the right side, and 1+ on the left side.  No peripheral edema.  Pulmonary/Chest: Effort normal and breath sounds normal. No accessory muscle usage. No tachypnea. No respiratory distress. He has no decreased breath sounds. He has no wheezes. He has no rhonchi. He has no rales.  Lymphadenopathy:       Head (right side): No submental, no submandibular and no tonsillar adenopathy present.       Head (left side): No submental, no submandibular and no tonsillar adenopathy present.    He has no cervical adenopathy.  Neurological: He is alert and oriented to person, place, and time.  6/6 monofilament testing right foot. 5/6 on left. (missed heel)  Psychiatric: He has a  normal mood and affect. His speech is normal.      Assessment & Plan:   25 yom with PMH DM, HTN, OSA presents for f/u and c/o cough.   Type 2 diabetes mellitus without complication - Plan: POCT glucose (manual entry), metFORMIN (GLUCOPHAGE) 1000 MG tablet, sitaGLIPtin (JANUVIA) 100 MG tablet --Continue current regimen, unable to check A1C today as last checked 2 months ago --Weight down 9# since last visit, walking daily and watching diet --Optho and podiatry seen in past couple months --RTC 3 months - A1C, cholesterol, urine ma at that time  Essential hypertension --Checking at home but cannot recall readings --Well controlled in clinic today --Continue current regimen --metabolic panel next visit in 3 months  OSA (obstructive sleep apnea) --Having issues affording cpap. Encouraged to work toward obtaining  Cough - Plan: Guaifenesin (MUCINEX MAXIMUM STRENGTH) 1200 MG TB12, benzonatate (  TESSALON) 100 MG capsule --Most likely viral uri at this point with benign PE and short history --Mucinex, tessalon --RTC 5-6 days if not improving or gets worse  Need for vaccination --Due for tdap, prevnar, he plans to check what the cost will be and f/u with [place]  Julieta Gutting, PA-C Physician Assistant-Certified [facility]  06/21/2014 2:55 PM

## 2014-06-25 ENCOUNTER — Encounter: Payer: Self-pay | Admitting: *Deleted

## 2014-07-07 ENCOUNTER — Other Ambulatory Visit: Payer: Self-pay | Admitting: Family Medicine

## 2014-07-08 NOTE — Telephone Encounter (Signed)
Dr Carlota Raspberry, you just saw pt for check-up, but don't see this med discussed or lipid test since Jan. Can we give him RFs?

## 2014-08-19 ENCOUNTER — Other Ambulatory Visit: Payer: Self-pay | Admitting: Neurology

## 2014-08-19 DIAGNOSIS — G47411 Narcolepsy with cataplexy: Secondary | ICD-10-CM

## 2014-08-19 DIAGNOSIS — G4733 Obstructive sleep apnea (adult) (pediatric): Secondary | ICD-10-CM

## 2014-08-19 MED ORDER — AMPHETAMINE-DEXTROAMPHET ER 30 MG PO CP24: 30.0000 mg | ORAL_CAPSULE | Freq: Every morning | ORAL | Status: DC

## 2014-08-19 NOTE — Telephone Encounter (Signed)
Request entered, forwarded to [facility] for approval.

## 2014-08-19 NOTE — Telephone Encounter (Signed)
Patient requesting Rx refill of amphetamine-dextroamphetamine (ADDERALL XR) 30 MG 24 hr capsule.  Please call when ready for pick up.

## 2014-08-23 NOTE — Telephone Encounter (Signed)
I called the patient to let them know their Rx for Adderall XR was ready for pickup. Patient was instructed to bring Photo ID.

## 2014-08-26 NOTE — Telephone Encounter (Signed)
Patient never picked up Rx of Adderall XR 30mg  dated 04/12/14.  The Rx is Void after 90 days and has been placed in the shred ben.

## 2014-09-06 ENCOUNTER — Ambulatory Visit (HOSPITAL_COMMUNITY)
Admission: RE | Admit: 2014-09-06 | Discharge: 2014-09-06 | Disposition: A | Discharge status: Home / Self Care | Payer: Medicare Other | Source: Ambulatory Visit | Attending: Oncology | Admitting: Oncology

## 2014-09-06 ENCOUNTER — Other Ambulatory Visit (HOSPITAL_BASED_OUTPATIENT_CLINIC_OR_DEPARTMENT_OTHER): Payer: Medicare Other

## 2014-09-06 ENCOUNTER — Encounter (HOSPITAL_COMMUNITY): Payer: Self-pay

## 2014-09-06 DIAGNOSIS — Z9889 Other specified postprocedural states: Secondary | ICD-10-CM | POA: Insufficient documentation

## 2014-09-06 DIAGNOSIS — K7460 Unspecified cirrhosis of liver: Secondary | ICD-10-CM | POA: Diagnosis not present

## 2014-09-06 DIAGNOSIS — E119 Type 2 diabetes mellitus without complications: Secondary | ICD-10-CM | POA: Diagnosis not present

## 2014-09-06 DIAGNOSIS — C220 Liver cell carcinoma: Secondary | ICD-10-CM | POA: Insufficient documentation

## 2014-09-06 DIAGNOSIS — C228 Malignant neoplasm of liver, primary, unspecified as to type: Secondary | ICD-10-CM | POA: Diagnosis not present

## 2014-09-06 DIAGNOSIS — I868 Varicose veins of other specified sites: Secondary | ICD-10-CM | POA: Diagnosis not present

## 2014-09-06 DIAGNOSIS — K8020 Calculus of gallbladder without cholecystitis without obstruction: Secondary | ICD-10-CM | POA: Diagnosis not present

## 2014-09-06 LAB — BASIC METABOLIC PANEL (CC13)
Anion Gap: 12 mEq/L — ABNORMAL HIGH (ref 3–11)
BUN: 12.9 mg/dL (ref 7.0–26.0)
CO2: 23 mEq/L (ref 22–29)
Calcium: 8.8 mg/dL (ref 8.4–10.4)
Chloride: 103 mEq/L (ref 98–109)
Creatinine: 1.1 mg/dL (ref 0.7–1.3)
EGFR: 82 mL/min/{1.73_m2} — ABNORMAL LOW (ref >90)
Glucose: 235 mg/dl — ABNORMAL HIGH (ref 70–140)
Potassium: 4.3 mEq/L (ref 3.5–5.1)
Sodium: 139 mEq/L (ref 136–145)

## 2014-09-06 MED ORDER — IOHEXOL 300 MG/ML  SOLN
100.0000 mL | Freq: Once | INTRAMUSCULAR | Status: AC | PRN
  Administered 2014-09-06: 100 mL via INTRAVENOUS

## 2014-09-07 LAB — AFP TUMOR MARKER-PREVIOUS METHOD: AFP Tumor Marker: 2.5 ng/mL (ref 0.0–8.0)

## 2014-09-07 LAB — AFP TUMOR MARKER: AFP-Tumor Marker: 3.3 ng/mL (ref <6.1)

## 2014-09-13 ENCOUNTER — Telehealth: Payer: Self-pay | Admitting: Oncology

## 2014-09-13 ENCOUNTER — Ambulatory Visit (HOSPITAL_BASED_OUTPATIENT_CLINIC_OR_DEPARTMENT_OTHER): Payer: Medicare Other | Admitting: Oncology

## 2014-09-13 VITALS — BP 133/73 | HR 71 | Temp 98.6°F | Resp 19 | Ht 69.5 in | Wt 214.2 lb

## 2014-09-13 DIAGNOSIS — Z8505 Personal history of malignant neoplasm of liver: Secondary | ICD-10-CM

## 2014-09-13 DIAGNOSIS — C220 Liver cell carcinoma: Secondary | ICD-10-CM

## 2014-09-13 NOTE — Telephone Encounter (Signed)
gv and printed appt sched and avs fo rpt for July °

## 2014-09-13 NOTE — Progress Notes (Signed)
  [facility] OFFICE PROGRESS NOTE   Diagnosis: Hepatocellular carcinoma   INTERVAL HISTORY:   He returns as scheduled. He feels well. Occasional pain in the right lower abdomen. No consistent pain. No other complaint.  Objective:  Vital signs in last 24 hours:  Blood pressure 133/73, pulse 71, temperature 98.6 F (37 C), temperature source Oral, resp. rate 19, height 5' 9.5" (1.765 m), weight 214 lb 3.2 oz (97.16 kg), SpO2 100 %.    HEENT: Neck without mass Lymphatics: No cervical, supra-clavicular, axillary, or inguinal nodes Resp: Lungs clear bilaterally Cardio: Regular rate and rhythm GI: No hepatosplenomegaly, no mass, nontender Vascular: No leg edema   Lab Results:  AFP on 09/06/2014-2.5   Imaging: CT of the abdomen and pelvis 09/06/2014-cirrhosis, upper abdominal varices, no evidence of recurrent hepatocellular carcinoma. Slight enlargement of pre cardiac lymph nodes   Medications: I have reviewed the patient's current medications.  Assessment/Plan: 1. Hepatocellular carcinoma, stage I (T1 NX) status post partial left hepatectomy 03/02/2013.  08/31/2013 AFP 2.4.   CT abdomen 08/31/2013 with postoperative changes of partial hepatectomy with small postoperative fluid collection along the resection margin. No definite signs to suggest residual or locally recurrent disease. Several borderline enlarged and minimally enlarged lymph nodes superior to the liver in the juxtapericardiac fat of the lower anterior mediastinum with the largest measuring 11 mm slightly increased compared to the prior study 01/15/2013. Several prominent non-pathologically enlarged upper abdominal ligament lymph nodes similar to the prior study. Small esophageal varices.   CT abdomen 03/03/2012 without evidence of recurrent hepatocellular carcinoma, stable right lung base nodule  CT the abdomen and pelvis 09/06/2014 without evidence of recurrent hepatocellular carcinoma, slight  enlargement of pre-cardiac lymph nodes 2. Cirrhosis. 3. Hepatitis B core antibody positive. 4. Diabetes.    Disposition:  He remains in clinical remission from the hepatocellular carcinoma. Mr. Dowdy will return for an office visit and AFP in 6 months.  Betsy Coder, MD  09/13/2014  9:02 AM

## 2014-09-22 DIAGNOSIS — E785 Hyperlipidemia, unspecified: Secondary | ICD-10-CM | POA: Diagnosis not present

## 2014-09-22 DIAGNOSIS — I10 Essential (primary) hypertension: Secondary | ICD-10-CM | POA: Diagnosis not present

## 2014-09-22 DIAGNOSIS — I252 Old myocardial infarction: Secondary | ICD-10-CM | POA: Diagnosis not present

## 2014-09-22 DIAGNOSIS — E119 Type 2 diabetes mellitus without complications: Secondary | ICD-10-CM | POA: Diagnosis not present

## 2014-09-22 DIAGNOSIS — I2510 Atherosclerotic heart disease of native coronary artery without angina pectoris: Secondary | ICD-10-CM | POA: Diagnosis not present

## 2014-09-30 DIAGNOSIS — L603 Nail dystrophy: Secondary | ICD-10-CM | POA: Diagnosis not present

## 2014-09-30 DIAGNOSIS — E0842 Diabetes mellitus due to underlying condition with diabetic polyneuropathy: Secondary | ICD-10-CM | POA: Diagnosis not present

## 2014-09-30 DIAGNOSIS — I739 Peripheral vascular disease, unspecified: Secondary | ICD-10-CM | POA: Diagnosis not present

## 2014-09-30 DIAGNOSIS — L84 Corns and callosities: Secondary | ICD-10-CM | POA: Diagnosis not present

## 2014-10-04 ENCOUNTER — Ambulatory Visit: Payer: Medicare HMO | Admitting: Family Medicine

## 2014-10-06 ENCOUNTER — Telehealth: Payer: Self-pay | Admitting: Neurology

## 2014-10-06 NOTE — Telephone Encounter (Signed)
Patient cannot afford Adderall.  His out-of-pocket cost was $320 first time around and then less expensive after that. Patient contacted [employer] and they told him to have his doctor call [phone] for authorization. Mr. Gerling best call back is [phone].

## 2014-10-06 NOTE — Telephone Encounter (Signed)
I have contacted ins and provided all clinical info.  Request is currently under review.  I called the patient back, got no answer.  Voicemail was not in [place], so I did not leave a message.

## 2014-10-07 NOTE — Progress Notes (Signed)
This encounter was created in error - please disregard.

## 2014-10-15 ENCOUNTER — Other Ambulatory Visit: Payer: Self-pay | Admitting: Family Medicine

## 2014-11-10 ENCOUNTER — Other Ambulatory Visit: Payer: Self-pay | Admitting: Neurology

## 2014-11-10 DIAGNOSIS — G47411 Narcolepsy with cataplexy: Secondary | ICD-10-CM

## 2014-11-10 DIAGNOSIS — G4733 Obstructive sleep apnea (adult) (pediatric): Secondary | ICD-10-CM

## 2014-11-10 MED ORDER — AMPHETAMINE-DEXTROAMPHET ER 30 MG PO CP24: 30.0000 mg | ORAL_CAPSULE | Freq: Every morning | ORAL | Status: DC

## 2014-11-10 NOTE — Telephone Encounter (Signed)
Patient is calling to get a written Rx for amphetamine-dextroamphetamine (ADDERALL XR) 30 MG 24 hr capsule. Please call patient when ready for pickup. Thank you.

## 2014-11-11 ENCOUNTER — Telehealth: Payer: Self-pay | Admitting: *Deleted

## 2014-11-11 NOTE — Telephone Encounter (Signed)
LM with family member that prescription ready for pickup.  Photo ID and daytime hours relayed.

## 2014-12-06 ENCOUNTER — Ambulatory Visit: Payer: Commercial Managed Care - HMO | Admitting: Family Medicine

## 2014-12-09 ENCOUNTER — Encounter: Payer: Self-pay | Admitting: Family Medicine

## 2014-12-09 NOTE — Progress Notes (Signed)
This encounter was created in error - please disregard.

## 2014-12-13 ENCOUNTER — Ambulatory Visit (INDEPENDENT_AMBULATORY_CARE_PROVIDER_SITE_OTHER): Payer: Medicare Other | Admitting: Family Medicine

## 2014-12-13 ENCOUNTER — Encounter: Payer: Self-pay | Admitting: Family Medicine

## 2014-12-13 VITALS — BP 132/80 | HR 77 | Temp 99.1°F | Resp 16 | Ht 69.0 in | Wt 210.6 lb

## 2014-12-13 DIAGNOSIS — I739 Peripheral vascular disease, unspecified: Secondary | ICD-10-CM | POA: Diagnosis not present

## 2014-12-13 DIAGNOSIS — E119 Type 2 diabetes mellitus without complications: Secondary | ICD-10-CM | POA: Diagnosis not present

## 2014-12-13 DIAGNOSIS — G4733 Obstructive sleep apnea (adult) (pediatric): Secondary | ICD-10-CM

## 2014-12-13 DIAGNOSIS — L84 Corns and callosities: Secondary | ICD-10-CM | POA: Diagnosis not present

## 2014-12-13 DIAGNOSIS — IMO0002 Reserved for concepts with insufficient information to code with codable children: Secondary | ICD-10-CM

## 2014-12-13 DIAGNOSIS — E1165 Type 2 diabetes mellitus with hyperglycemia: Secondary | ICD-10-CM | POA: Diagnosis not present

## 2014-12-13 DIAGNOSIS — I10 Essential (primary) hypertension: Secondary | ICD-10-CM | POA: Diagnosis not present

## 2014-12-13 DIAGNOSIS — E785 Hyperlipidemia, unspecified: Secondary | ICD-10-CM

## 2014-12-13 DIAGNOSIS — L603 Nail dystrophy: Secondary | ICD-10-CM | POA: Diagnosis not present

## 2014-12-13 DIAGNOSIS — E0842 Diabetes mellitus due to underlying condition with diabetic polyneuropathy: Secondary | ICD-10-CM | POA: Diagnosis not present

## 2014-12-13 LAB — GLUCOSE, POCT (MANUAL RESULT ENTRY): POC Glucose: 177 mg/dl — AB (ref 70–99)

## 2014-12-13 LAB — POCT GLYCOSYLATED HEMOGLOBIN (HGB A1C): Hemoglobin A1C: 8.3

## 2014-12-13 MED ORDER — SITAGLIPTIN PHOSPHATE 100 MG PO TABS: 100.0000 mg | ORAL_TABLET | Freq: Every day | ORAL | Status: DC

## 2014-12-13 MED ORDER — LOSARTAN POTASSIUM 25 MG PO TABS: 25.0000 mg | ORAL_TABLET | Freq: Every day | ORAL | Status: DC

## 2014-12-13 MED ORDER — AMLODIPINE BESYLATE 5 MG PO TABS: 5.0000 mg | ORAL_TABLET | Freq: Every day | ORAL | Status: DC

## 2014-12-13 MED ORDER — SPIRONOLACTONE 25 MG PO TABS: 25.0000 mg | ORAL_TABLET | Freq: Two times a day (BID) | ORAL | Status: DC

## 2014-12-13 MED ORDER — PRAVASTATIN SODIUM 20 MG PO TABS: 20.0000 mg | ORAL_TABLET | Freq: Every day | ORAL | Status: DC

## 2014-12-13 NOTE — Progress Notes (Signed)
Subjective:  This chart was scribed for [name], MD by [facility], medical scribe at [facility].The patient was seen in exam room 24 and the patient's care was started at 4:01 PM.   Patient ID: [name], male    DOB: Oct 14, 1945, 69 y.o.   MRN: [id number]   Chief Complaint  Patient presents with  . Hypertension  . Diabetes   HPI  HPI Comments: [name] is a 69 y.o. male who presents to [facility] for a follow up for hypertension and diabetes. Has a history hepatocellular carcinoma followed by Dr. Benay Spice, last visit 09/13/2014. In clinical remission with plan for follow up AFP in July. Pt states he is doing well, life is good. No muscle cramps.  Narcolepsy cataplexy/ OSA. Followed by Dr. Brett Fairy, has had difficulty obtaining CPAP equipment in the past but treated xyrem and adderall for narcolepsy. He has still not gotten his CPAP. Stopped taking xyrem for cost issues.  Hypertension: On Norvasc 5 mg a day, Toprol 50 mg once daily and spironolactone 25 mg daily  Diabetes: Last A1c was 7.2 in August 2015, he is taking metformin 1000 mg twice daily and Januvia 100 mg once daily. Not checking his blood sugar at home. Still taking metformin twice a day but not the Januvia. He has not been taking it for one month. He does have a meter at home to check his blood sugar.  Health Maintenance: Tdap and Prevnar were recommended at last visit. Deferred at that time until he could check cost.  Patient Active Problem List   Diagnosis Date Noted  . 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 Altered mental status 03/05/2013  . Respiratory failure, post-operative 03/02/2013  . Hepatitis B 02/24/2013  . Cancer, hepatocellular 02/10/2013  . Liver tumor-bleeding 01/15/2013  . Epidermal cyst 06/18/2012  . OSA (obstructive sleep apnea) 03/13/2012  . Abnormal leg movement 12/12/2011  . Blood pressure elevated 09/06/2011  . Diabetes  mellitus 09/06/2011  . 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 BPH (benign prostatic hyperplasia) 09/06/2011  . Diverticula of colon 09/06/2011   Past Medical History  Diagnosis Date  . Diabetes mellitus without complication   . Hypertension   . Hyperlipidemia   . Cancer     hepatocellular cancer   . Hepatitis   . Narcolepsy     per office visit note of 08/2011   . Sleep apnea    Past Surgical History  Procedure Laterality Date  . Pinched nerve in back    . Laparoscopy  03/02/2013    Procedure: LAPAROSCOPY DIAGNOSTIC;  Surgeon: [name], MD;  Location: [facility];  Service: General;;  . Liver ultrasound  03/02/2013    Procedure: LIVER ULTRASOUND;  Surgeon: [name], MD;  Location: [facility];  Service: General;;  . Open partial hepatectomy [83]  03/02/2013    Procedure: OPEN PARTIAL HEPATECTOMY [83];  Surgeon: [name], MD;  Location: [facility];  Service: General;;  DX LAPAROSCOPY, INTRAOPERATIVE LIVER ULTRASOUND, OPEN PARTIAL HEPATECTOMY   Allergies  Allergen Reactions  . Ace Inhibitors Swelling    Angioedema - face.    Prior to Admission medications   Medication Sig Start Date End Date Taking? Authorizing Provider  alfuzosin (UROXATRAL) 10 MG 24 hr tablet Take 10 mg by mouth at bedtime.  12/19/12   Historical Provider, MD  amLODipine (NORVASC) 5 MG tablet TAKE ONE TABLET BY MOUTH ONCE DAILY 04/26/14   Wendie Agreste, MD  amphetamine-dextroamphetamine (ADDERALL  XR) 30 MG 24 hr capsule Take 1 capsule (30 mg total) by mouth every morning. 11/10/14   Larey Seat, MD  aspirin 81 MG tablet Take 81 mg by mouth every morning.     Historical Provider, MD  benzonatate (TESSALON) 100 MG capsule Take 1-2 capsules (100-200 mg total) by mouth 3 (three) times daily as needed for cough. Patient not taking: Reported on 09/13/2014 06/21/14   Merlinda Frederick McVeigh, PA  Guaifenesin [facility] MAXIMUM STRENGTH) 1200 MG TB12 Take 1 tablet (1,200 mg total) by mouth every 12 (twelve) hours as needed. Patient not taking:  Reported on 09/13/2014 06/21/14   Merlinda Frederick McVeigh, PA  losartan (COZAAR) 25 MG tablet Take 1 tablet (25 mg total) by mouth daily. 04/19/14 04/19/15  Wendie Agreste, MD  metFORMIN (GLUCOPHAGE) 1000 MG tablet TAKE ONE TABLET BY MOUTH TWICE DAILY WITH A MEAL 06/21/14   Merlinda Frederick McVeigh, PA  metoprolol succinate (TOPROL-XL) 50 MG 24 hr tablet Take 1 tablet (50 mg total) by mouth daily. 04/19/14   Wendie Agreste, MD  pravastatin (PRAVACHOL) 20 MG tablet Take 1 tablet (20 mg total) by mouth at bedtime. PATIENT NEEDS OFFICE VISIT/FASTING LABS FOR ADDITIONAL REFILLS 10/17/14   Wendie Agreste, MD  sitaGLIPtin (JANUVIA) 100 MG tablet Take 1 tablet (100 mg total) by mouth daily. 06/21/14   Merlinda Frederick McVeigh, PA  Sodium Oxybate 500 MG/ML SOLN Take 4,500 mg by mouth 2 (two) times daily. 11/11/13   Asencion Partridge Dohmeier, MD  spironolactone (ALDACTONE) 25 MG tablet Take 1 tablet (25 mg total) by mouth 2 (two) times daily. 04/19/14   Wendie Agreste, MD   History   Social History  . Marital Status: Single    Spouse Name: N/A  . Number of Children: 2  . Years of Education: 15+   Occupational History  . Not on file.   Social History Main Topics  . Smoking status: Former Research scientist (life sciences)  . Smokeless tobacco: Never Used  . Alcohol Use: No  . Drug Use: No  . Sexual Activity: Yes   Other Topics Concern  . Not on file   Social History Narrative   Patient is single and lives alone- became widower when 1 child was 82 year old, divorced 2nd marriage   Has #2 grown children, not in area or involved in his life   Patient is retired.   Patient has a college education.   Patient is right-handed.   Patient drinks maybe two sodas daily.   Review of Systems  Constitutional: Negative for fatigue and unexpected weight change.  Eyes: Negative for visual disturbance.  Respiratory: Negative for cough, chest tightness and shortness of breath.   Cardiovascular: Negative for chest pain, palpitations and leg swelling.  Gastrointestinal:  Negative for abdominal pain and blood in stool.  Neurological: Negative for dizziness, light-headedness and headaches.      Objective:  BP 132/80 mmHg  Pulse 77  Temp(Src) 99.1 F (37.3 C) (Oral)  Resp 16  Ht 5\' 9"  (1.753 m)  Wt 210 lb 9.6 oz (95.528 kg)  BMI 31.09 kg/m2  SpO2 98% Physical Exam  Constitutional: He is oriented to person, place, and time. He appears well-developed and well-nourished. No distress.  HENT:  Head: Normocephalic and atraumatic.  Eyes: EOM are normal. Pupils are equal, round, and reactive to light.  Neck: Normal range of motion. No JVD present. Carotid bruit is not present.  Cardiovascular: Normal rate, regular rhythm and normal heart sounds.   No murmur heard.  Pulmonary/Chest: Effort normal and breath sounds normal. No respiratory distress. He has no rales.  Musculoskeletal: Normal range of motion. He exhibits no edema.  Neurological: He is alert and oriented to person, place, and time.  Skin: Skin is warm and dry.  Psychiatric: He has a normal mood and affect. His behavior is normal.  Nursing note and vitals reviewed.  Results for orders placed or performed in visit on 12/13/14  POCT glucose (manual entry)  Result Value Ref Range   POC Glucose 177 (A) 70 - 99 mg/dl  POCT glycosylated hemoglobin (Hb A1C)  Result Value Ref Range   Hemoglobin A1C 8.3       Assessment & Plan:   [name] is a 69 y.o. male DM (diabetes mellitus), type 2, uncontrolled - Plan: POCT glucose (manual entry), POCT glycosylated hemoglobin (Hb A1C), Comprehensive metabolic panel, CANCELED: Lipid panel  - uncontrolled off Januvia - can try restarting this and continuing metformin, repeat A1c in 3 months.   -labs,  including microalbumin pending.   Essential hypertension - Plan: amLODipine (NORVASC) 5 MG tablet, spironolactone (ALDACTONE) 25 MG tablet, losartan (COZAAR) 25 MG tablet  -stable. No med changes for now.   Hyperlipidemia - Plan: pravastatin  (PRAVACHOL) 20 MG tablet  -continue pravastain for now, check lipids (fasting lab visit), may need atorvastatin or rosuvastatin with ASCVD risk with DM2. Labs pending.   OSA (obstructive sleep apnea)  - CPAP cost prohibitive for now, recommended discussing meds with sleep specialist to see if cost savings possible.    Meds ordered this encounter  Medications  . amLODipine (NORVASC) 5 MG tablet    Sig: Take 1 tablet (5 mg total) by mouth daily.    Dispense:  30 tablet    Refill:  0  . spironolactone (ALDACTONE) 25 MG tablet    Sig: Take 1 tablet (25 mg total) by mouth 2 (two) times daily.    Dispense:  60 tablet    Refill:  0  . sitaGLIPtin (JANUVIA) 100 MG tablet    Sig: Take 1 tablet (100 mg total) by mouth daily.    Dispense:  30 tablet    Refill:  0  . pravastatin (PRAVACHOL) 20 MG tablet    Sig: Take 1 tablet (20 mg total) by mouth at bedtime.    Dispense:  30 tablet    Refill:  0  . losartan (COZAAR) 25 MG tablet    Sig: Take 1 tablet (25 mg total) by mouth daily.    Dispense:  30 tablet    Refill:  0   Patient Instructions  Restart januvia, continue metformin and recheck in next 3 months as other diabetes changes may be needed.  Keep a record of your blood sugars outside of the office and bring them to the next office visit. Return to the clinic or go to the nearest emergency room if any of your symptoms worsen or new symptoms occur. You should receive a call or letter about your lab results within the next week to 10 days.  Call your sleep specialist about the Xyrem and sleep meds.  return in next 2 weeks for lab visit only (8 hours fasting).       I personally performed the services described in this documentation, which was scribed in my presence. The recorded information has been reviewed and considered, and addended by me as needed.

## 2014-12-13 NOTE — Patient Instructions (Addendum)
Restart januvia, continue metformin and recheck in next 3 months as other diabetes changes may be needed.  Keep a record of your blood sugars outside of the office and bring them to the next office visit. Return to the clinic or go to the nearest emergency room if any of your symptoms worsen or new symptoms occur. You should receive a call or letter about your lab results within the next week to 10 days.  Call your sleep specialist about the Xyrem and sleep meds.  return in next 2 weeks for lab visit only (8 hours fasting).

## 2014-12-14 LAB — COMPREHENSIVE METABOLIC PANEL
ALT: 34 U/L (ref 0–53)
AST: 35 U/L (ref 0–37)
Albumin: 3.8 g/dL (ref 3.5–5.2)
Alkaline Phosphatase: 77 U/L (ref 39–117)
BUN: 10 mg/dL (ref 6–23)
CO2: 21 mEq/L (ref 19–32)
Calcium: 9.1 mg/dL (ref 8.4–10.5)
Chloride: 102 mEq/L (ref 96–112)
Creat: 0.89 mg/dL (ref 0.50–1.35)
Glucose, Bld: 138 mg/dL — ABNORMAL HIGH (ref 70–99)
Potassium: 4.6 mEq/L (ref 3.5–5.3)
Sodium: 139 mEq/L (ref 135–145)
Total Bilirubin: 0.7 mg/dL (ref 0.2–1.2)
Total Protein: 7.6 g/dL (ref 6.0–8.3)

## 2014-12-14 LAB — MICROALBUMIN, URINE: Microalb, Ur: 0.3 mg/dL (ref <2.0)

## 2014-12-20 ENCOUNTER — Other Ambulatory Visit: Payer: Self-pay | Admitting: Family Medicine

## 2014-12-21 ENCOUNTER — Other Ambulatory Visit: Payer: Self-pay | Admitting: *Deleted

## 2014-12-21 DIAGNOSIS — G4733 Obstructive sleep apnea (adult) (pediatric): Secondary | ICD-10-CM

## 2014-12-21 DIAGNOSIS — G47411 Narcolepsy with cataplexy: Secondary | ICD-10-CM

## 2014-12-21 NOTE — Telephone Encounter (Signed)
Request entered, forwarded to provider for approval.  

## 2014-12-22 ENCOUNTER — Telehealth: Payer: Self-pay

## 2014-12-22 MED ORDER — AMPHETAMINE-DEXTROAMPHET ER 30 MG PO CP24: 30.0000 mg | ORAL_CAPSULE | Freq: Every morning | ORAL | Status: DC

## 2014-12-22 NOTE — Telephone Encounter (Signed)
Called pt to inform him that his adderall rx is ready for pick up. Gave clinic hours. Pt verbalized understanding.

## 2015-01-03 ENCOUNTER — Other Ambulatory Visit (INDEPENDENT_AMBULATORY_CARE_PROVIDER_SITE_OTHER): Payer: Medicare Other

## 2015-01-03 DIAGNOSIS — E785 Hyperlipidemia, unspecified: Secondary | ICD-10-CM

## 2015-01-03 NOTE — Progress Notes (Signed)
Pt is here for lab work only. 

## 2015-01-04 ENCOUNTER — Other Ambulatory Visit: Payer: Self-pay

## 2015-01-04 DIAGNOSIS — E785 Hyperlipidemia, unspecified: Secondary | ICD-10-CM

## 2015-01-04 DIAGNOSIS — I10 Essential (primary) hypertension: Secondary | ICD-10-CM

## 2015-01-04 LAB — LIPID PANEL
Cholesterol: 114 mg/dL (ref 0–200)
HDL: 39 mg/dL — ABNORMAL LOW (ref >=40)
LDL Cholesterol: 54 mg/dL (ref 0–99)
Total CHOL/HDL Ratio: 2.9 Ratio
Triglycerides: 103 mg/dL (ref <150)
VLDL: 21 mg/dL (ref 0–40)

## 2015-01-04 MED ORDER — PRAVASTATIN SODIUM 20 MG PO TABS: 20.0000 mg | ORAL_TABLET | Freq: Every day | ORAL | Status: DC

## 2015-01-04 MED ORDER — SPIRONOLACTONE 25 MG PO TABS: 25.0000 mg | ORAL_TABLET | Freq: Two times a day (BID) | ORAL | Status: DC

## 2015-01-06 ENCOUNTER — Other Ambulatory Visit: Payer: Self-pay

## 2015-01-06 DIAGNOSIS — I10 Essential (primary) hypertension: Secondary | ICD-10-CM

## 2015-01-06 DIAGNOSIS — E119 Type 2 diabetes mellitus without complications: Secondary | ICD-10-CM

## 2015-01-06 MED ORDER — METFORMIN HCL 1000 MG PO TABS: ORAL_TABLET | ORAL | Status: DC

## 2015-01-06 MED ORDER — LOSARTAN POTASSIUM 25 MG PO TABS: 25.0000 mg | ORAL_TABLET | Freq: Every day | ORAL | Status: DC

## 2015-01-06 MED ORDER — AMLODIPINE BESYLATE 5 MG PO TABS: 5.0000 mg | ORAL_TABLET | Freq: Every day | ORAL | Status: DC

## 2015-01-08 DIAGNOSIS — H35373 Puckering of macula, bilateral: Secondary | ICD-10-CM | POA: Diagnosis not present

## 2015-01-08 DIAGNOSIS — H2513 Age-related nuclear cataract, bilateral: Secondary | ICD-10-CM | POA: Diagnosis not present

## 2015-01-08 DIAGNOSIS — E119 Type 2 diabetes mellitus without complications: Secondary | ICD-10-CM | POA: Diagnosis not present

## 2015-01-08 DIAGNOSIS — H40013 Open angle with borderline findings, low risk, bilateral: Secondary | ICD-10-CM | POA: Diagnosis not present

## 2015-01-31 ENCOUNTER — Ambulatory Visit (INDEPENDENT_AMBULATORY_CARE_PROVIDER_SITE_OTHER): Payer: Medicare Other | Admitting: Neurology

## 2015-01-31 ENCOUNTER — Encounter: Payer: Self-pay | Admitting: Neurology

## 2015-01-31 VITALS — BP 120/60 | HR 78 | Resp 20 | Ht 70.08 in | Wt 207.0 lb

## 2015-01-31 DIAGNOSIS — G47411 Narcolepsy with cataplexy: Secondary | ICD-10-CM

## 2015-01-31 DIAGNOSIS — G4733 Obstructive sleep apnea (adult) (pediatric): Secondary | ICD-10-CM

## 2015-01-31 MED ORDER — AMPHETAMINE-DEXTROAMPHET ER 30 MG PO CP24: 30.0000 mg | ORAL_CAPSULE | Freq: Every morning | ORAL | Status: DC

## 2015-01-31 NOTE — Progress Notes (Signed)
.cc Subjective:    Patient ID: [name] is a 69 y.o. male.  HPI   Interim history:    [name] is a very pleasant 69 y.o.-year-old Right-handed male, with an underlying medical history of hypertension, hyperlipidemia, diabetes, who presents for followup consultation of his narcolepsy as well as sleep apnea.   01-31-15  The patient is unaccompanied today. 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  He has sleep study in 2012 which showed moderate obstructive sleep apnea and was titrated on CPAP of 9 cm of water pressure.  He had residual sleep apnea findings without pressure and she increased his pressure to 10 cm which improved his residual AHI. While he was compliant with CPAP at the time he had problems with co-pays and indicated that he could not afford CPAP. At some point he returned a CPAP machine to his home health company.   He is willing to get restarted on treatment. He has a history of narcolepsy and indicates no recent cataplectic attacks. He was tried on Provigil which did not help  sleepiness. He was last on Xyrem 4.5 g and 3 g each night  In November 2015 , and he continued on  Adderall XR 30 mg daily.  He feels more sleepy since Xyrem stopped. Based on the recent liver surgery will need to review LFT every 3 month if patient stays on XYREM. He has Insomnia now, on XR adderall, which he never had before.  I will refill  medications and  he does  require any refills today.  He indicates no new problems. He feels that his adderall only medication regimen is working "good enough ".    His Past Medical History Is Significant For: Past Medical History  Diagnosis Date  . Diabetes mellitus without complication   . Hypertension   .  Hyperlipidemia   . Cancer     hepatocellular cancer   . Hepatitis   . Narcolepsy     per office visit note of 08/2011   . Sleep apnea     His Past Surgical History Is Significant For: Past Surgical History  Procedure Laterality Date  . Pinched nerve in back    . Laparoscopy  03/02/2013    Procedure: LAPAROSCOPY DIAGNOSTIC;  Surgeon: Edward Klein, MD;  Location: [facility];  Service: General;;  . Liver ultrasound  03/02/2013    Procedure: LIVER ULTRASOUND;  Surgeon: Edward Klein, MD;  Location: [facility];  Service: General;;  . Open partial hepatectomy [83]  03/02/2013    Procedure: OPEN PARTIAL HEPATECTOMY [83];  Surgeon: Edward Klein, MD;  Location: [facility];  Service: General;;  DX LAPAROSCOPY, INTRAOPERATIVE LIVER ULTRASOUND, OPEN PARTIAL HEPATECTOMY    His Family History Is Significant For: Family History  Problem Relation Age of Onset  . Dementia Mother   . Cancer Father   . Diabetes Brother   . Hypertension Brother   . Cancer Brother     His Social History Is Significant For: History   Social History  . Marital Status: Single    Spouse Name: N/A  . Number of Children: 2  . Years of Education: 15+   Social History Main Topics  . Smoking status: Former Research scientist (life sciences)  . Smokeless tobacco: Never Used  .  Alcohol Use: No  . Drug Use: No  . Sexual Activity: Yes   Other Topics Concern  . None   Social History Narrative   Patient is single and lives alone- became widower when 1 child was 11 year old, divorced 2nd marriage   Has #2 grown children, not in area or involved in his life   Patient is retired.   Patient has a college education.   Patient is right-handed.   Patient drinks maybe two sodas daily.    His Allergies Are:  Allergies  Allergen Reactions  . Ace Inhibitors Swelling    Angioedema - face.   :   His Current Medications Are:  Outpatient Encounter Prescriptions as of 01/31/2015  Medication Sig  . alfuzosin (UROXATRAL) 10 MG 24 hr tablet Take 10 mg by mouth at  bedtime.   [name] Kitchen amLODipine (NORVASC) 5 MG tablet Take 1 tablet (5 mg total) by mouth daily.  [name] Kitchen amphetamine-dextroamphetamine (ADDERALL XR) 30 MG 24 hr capsule Take 1 capsule (30 mg total) by mouth every morning.  [name] Kitchen aspirin 81 MG tablet Take 81 mg by mouth every morning.   [name] Kitchen losartan (COZAAR) 25 MG tablet Take 1 tablet (25 mg total) by mouth daily.  . metFORMIN (GLUCOPHAGE) 1000 MG tablet TAKE ONE TABLET BY MOUTH TWICE DAILY WITH A MEAL  . metoprolol succinate (TOPROL-XL) 50 MG 24 hr tablet Take 1 tablet (50 mg total) by mouth daily.  . pravastatin (PRAVACHOL) 20 MG tablet Take 1 tablet (20 mg total) by mouth at bedtime.  . sitaGLIPtin (JANUVIA) 100 MG tablet Take 1 tablet (100 mg total) by mouth daily.  . Sodium Oxybate 500 MG/ML SOLN Take 4,500 mg by mouth 2 (two) times daily.  [name] Kitchen spironolactone (ALDACTONE) 25 MG tablet Take 1 tablet (25 mg total) by mouth 2 (two) times daily.   No facility-administered encounter medications on file as of 01/31/2015.  : Review of Systems  All other systems reviewed and are negative.    Objective:  Neurologic Exam  Physical Exam Physical Examination:   Filed Vitals:   01/31/15 1539  BP: 120/60  Pulse: 78  Resp: 20    General Examination: The patient is a very pleasant 69 y.o. male in no acute distress. He appears well-developed and well-nourished .   Mallampati 3, poor dental status,  No TMj and no nasal obstruction.   Clear to auscultation without wheezing, rhonchi or crackles noted.  S1+S2+0, regular and normal without murmurs, rubs or gallops noted. Neck is supple with full range of passive and active motion.  There are no carotid bruits on auscultation.   Abdominal scar- status post abdominal surgery after "  a liver tumor ruptured" . October 2014 . There is no pitting edema in the distal lower extremities bilaterally. Pedal pulses are intact. No rash.  Warm and dry without trophic changes noted. There are no varicose veins. No obvious joint  deformities, tenderness or joint swelling or erythema.   Neurologically:  Mental status: The patient is awake, alert and oriented , His memory, attention, language and knowledge are appropriate.  There is no aphasia, agnosia, apraxia or anomia. Speech is clear with normal prosody and enunciation.  Thought process is linear. Mood is congruent and affect is normal. He is pleasant and cooperative.   Cranial nerves :normocephalic, atraumatic, pupils are equal, round and reactive to light and accommodation.  Extraocular tracking is good without limitation to gaze excursion or nystagmus noted.  Face is symmetric with normal facial animation and  normal facial sensation.  EOM : Normal smooth pursuit is noted. Hearing is impaired. Hearing aid in place. Speech is clear with no dysarthria noted. There is no hypophonia. There is no lip, neck/head, jaw or voice tremor. Oropharynx exam reveals: mild mouth dryness,  Tongue protrudes centrally and palate elevates symmetrically. In addition, shoulder shrug is normal with equal shoulder height noted.   Motor exam: Normal bulk, strength and tone is noted. There is no drift, tremor or rebound.  Romberg is negative. Reflexes are 1+ in the upper extremities and absent in the lower extremities.  Fine motor skills are intact with normal finger taps, normal hand movements. Cerebellar testing shows no dysmetria or intention tremor on finger to nose testing.        Assessment and Plan:   In summary, RAAHIM SHARTZER is a very pleasant 69 y.o.-year old male with a history of clinical narcolepsy and obstructive sleep apnea.  He is retired from Scientific laboratory technician driving, he could not tolerate the heat and humidity in the ware [place], and he as sleeping at the wheel!   He is currently not on CPAP therapy but would certainly benefit. I have counseled him at length today about the importance of being compliant with CPAP therapy , but his  DME company, [facility], would  requiere a new sleep study. He is reluctant to allow [place] to retest him for OSA-  He has trouble to afford the co-pay and monthly rental for the machine.  He was titrated to 9 cm water in  03-05-11 and had an AHI of 20.   I will refill his adderall.  Xyrem is discontinued. .   Since he is stable he can followup in 6 months. I have asked him to have CMET drawn today and

## 2015-01-31 NOTE — Patient Instructions (Signed)
Amphetamine; Dextroamphetamine tablets What is this medicine? AMPHETAMINE; DEXTROAMPHETAMINE(am FET a meen; dex troe am FET a meen) is used to treat attention-deficit hyperactivity disorder (ADHD). It may also be used for narcolepsy. Federal law prohibits giving this medicine to any person other than the person for whom it was prescribed. Do not share this medicine with anyone else. This medicine may be used for other purposes; ask your health care provider or pharmacist if you have questions. COMMON BRAND NAME(S): Adderall What should I tell my health care provider before I take this medicine? They need to know if you have any of these conditions: -anxiety or panic attacks -circulation problems in fingers and toes -glaucoma -hardening or blockages of the arteries or heart blood vessels -heart disease or a heart defect -high blood pressure -history of a drug or alcohol abuse problem -history of stroke -kidney disease -liver disease -mental illness -seizures -suicidal thoughts, plans, or attempt; a previous suicide attempt by you or a family member -thyroid disease -Tourette's syndrome -an unusual or allergic reaction to dextroamphetamine, other amphetamines, other medicines, foods, dyes, or preservatives -pregnant or trying to get pregnant -breast-feeding How should I use this medicine? Take this medicine by mouth with a glass of water. Follow the directions on the prescription label. Take your doses at regular intervals. Do not take your medicine more often than directed. Do not suddenly stop your medicine. You must gradually reduce the dose or you may feel withdrawal effects. Ask your doctor or health care professional for advice. Talk to your pediatrician regarding the use of this medicine in children. Special care may be needed. While this drug may be prescribed for children as young as 3 years for selected conditions, precautions do apply. Overdosage: If you think you have taken too  much of this medicine contact a poison control center or emergency room at once. NOTE: This medicine is only for you. Do not share this medicine with others. What if I miss a dose? If you miss a dose, take it as soon as you can. If it is almost time for your next dose, take only that dose. Do not take double or extra doses. What may interact with this medicine? Do not take this medicine with any of the following medications: -certain medicines for migraine headache like almotriptan, eletriptan, frovatriptan, naratriptan, rizatriptan, sumatriptan, zolmitriptan -MAOIS like Carbex, Eldepryl, Marplan, Nardil, and Parnate -meperidine -other stimulant medicines for attention disorders, weight loss, or to stay awake -pimozide -procarbazine This medicine may also interact with the following medications: -acetazolamide -ammonium chloride -antacids -ascorbic acid -atomoxetine -caffeine -certain medicines for blood pressure -certain medicines for depression, anxiety, or psychotic disturbances -certain medicines for seizures like carbamazepine, phenobarbital, phenytoin -certain medicines for stomach problems like cimetidine, famotidine, omeprazole, lansoprazole -cold or allergy medicines -glutamic acid -methenamine -narcotic medicines for pain -norepinephrine -phenothiazines like chlorpromazine, mesoridazine, prochlorperazine, thioridazine -sodium acid phosphate -sodium bicarbonate This list may not describe all possible interactions. Give your health care provider a list of all the medicines, herbs, non-prescription drugs, or dietary supplements you use. Also tell them if you smoke, drink alcohol, or use illegal drugs. Some items may interact with your medicine. What should I watch for while using this medicine? Visit your doctor or health care professional for regular checks on your progress. This prescription requires that you follow special procedures with your doctor and pharmacy. You will  need to have a new written prescription from your doctor every time you need a refill. This medicine may affect your  concentration, or hide signs of tiredness. Until you know how this medicine affects you, do not drive, ride a bicycle, use machinery, or do anything that needs mental alertness. Tell your doctor or health care professional if this medicine loses its effects, or if you feel you need to take more than the prescribed amount. Do not change the dosage without talking to your doctor or health care professional. Decreased appetite is a common side effect when starting this medicine. Eating small, frequent meals or snacks can help. Talk to your doctor if you continue to have poor eating habits. Height and weight growth of a child taking this medicine will be monitored closely. Do not take this medicine close to bedtime. It may prevent you from sleeping. If you are going to need surgery, a MRI, CT scan, or other procedure, tell your doctor that you are taking this medicine. You may need to stop taking this medicine before the procedure. Tell your doctor or healthcare professional right away if you notice unexplained wounds on your fingers and toes while taking this medicine. You should also tell your healthcare provider if you experience numbness or pain, changes in the skin color, or sensitivity to temperature in your fingers or toes. What side effects may I notice from receiving this medicine? Side effects that you should report to your doctor or health care professional as soon as possible: -allergic reactions like skin rash, itching or hives, swelling of the face, lips, or tongue -changes in vision -chest pain or chest tightness -confusion, trouble speaking or understanding -fast, irregular heartbeat -fingers or toes feel numb, cool, painful -hallucination, loss of contact with reality -high blood pressure -males: prolonged or painful erection -seizures -severe headaches -shortness of  breath -suicidal thoughts or other mood changes -trouble walking, dizziness, loss of balance or coordination -uncontrollable head, mouth, neck, arm, or leg movements Side effects that usually do not require medical attention (report to your doctor or health care professional if they continue or are bothersome): -anxious -headache -loss of appetite -nausea, vomiting -trouble sleeping -weight loss This list may not describe all possible side effects. Call your doctor for medical advice about side effects. You may report side effects to FDA at 1-800-FDA-1088. Where should I keep my medicine? Keep out of the reach of children. This medicine can be abused. Keep your medicine in a safe place to protect it from theft. Do not share this medicine with anyone. Selling or giving away this medicine is dangerous and against the law. Store at room temperature between 15 and 30 degrees C (59 and 86 degrees F). Keep container tightly closed. Throw away any unused medicine after the expiration date. NOTE: This sheet is a summary. It may not cover all possible information. If you have questions about this medicine, talk to your doctor, pharmacist, or health care provider.  2015, [employer]. (2013-06-19 18:16:55)

## 2015-03-14 ENCOUNTER — Ambulatory Visit: Payer: Medicare Other | Admitting: Nurse Practitioner

## 2015-03-14 ENCOUNTER — Other Ambulatory Visit: Payer: Medicare Other

## 2015-03-14 ENCOUNTER — Encounter: Payer: Self-pay | Admitting: Family Medicine

## 2015-03-14 ENCOUNTER — Ambulatory Visit (INDEPENDENT_AMBULATORY_CARE_PROVIDER_SITE_OTHER): Payer: Medicare Other | Admitting: Family Medicine

## 2015-03-14 VITALS — BP 104/66 | HR 73 | Temp 98.4°F | Resp 16 | Ht 70.0 in | Wt 206.6 lb

## 2015-03-14 DIAGNOSIS — I10 Essential (primary) hypertension: Secondary | ICD-10-CM

## 2015-03-14 DIAGNOSIS — E119 Type 2 diabetes mellitus without complications: Secondary | ICD-10-CM

## 2015-03-14 DIAGNOSIS — E785 Hyperlipidemia, unspecified: Secondary | ICD-10-CM

## 2015-03-14 DIAGNOSIS — Z23 Encounter for immunization: Secondary | ICD-10-CM

## 2015-03-14 DIAGNOSIS — C220 Liver cell carcinoma: Secondary | ICD-10-CM

## 2015-03-14 LAB — GLUCOSE, POCT (MANUAL RESULT ENTRY): POC Glucose: 141 mg/dl — AB (ref 70–99)

## 2015-03-14 LAB — POCT GLYCOSYLATED HEMOGLOBIN (HGB A1C): Hemoglobin A1C: 8.7

## 2015-03-14 MED ORDER — METFORMIN HCL 1000 MG PO TABS: ORAL_TABLET | ORAL | Status: DC

## 2015-03-14 MED ORDER — SPIRONOLACTONE 25 MG PO TABS: 25.0000 mg | ORAL_TABLET | Freq: Two times a day (BID) | ORAL | Status: DC

## 2015-03-14 MED ORDER — PRAVASTATIN SODIUM 20 MG PO TABS: 20.0000 mg | ORAL_TABLET | Freq: Every day | ORAL | Status: DC

## 2015-03-14 MED ORDER — SITAGLIPTIN PHOSPHATE 100 MG PO TABS: 100.0000 mg | ORAL_TABLET | Freq: Every day | ORAL | Status: DC

## 2015-03-14 MED ORDER — METOPROLOL SUCCINATE ER 50 MG PO TB24: 50.0000 mg | ORAL_TABLET | Freq: Every day | ORAL | Status: DC

## 2015-03-14 NOTE — Progress Notes (Addendum)
Subjective:   This chart was scribed for Edward Ray, MD by [name], Medical Scribe. This patient was seen in Room 23 and the patient's care was started at 3:27 PM.   Patient ID: [name], [name]    DOB: 05/16/1946, 69 y.o.   MRN: [id number]   Chief Complaint  Patient presents with  . Diabetes  . Hypertension  . Hyperlipidemia    HPI [name] is a 69 y.o. [name]   1. Diabetes Mellitus Lab [name]  Component Value Date   [name] 8.3 12/13/2014  At last visit in paril 2016 he was not taking Januvai but was taking Metformin 1000 mg BID. Planned on restarting Januvia and repeat A1C today. He had a normal urine microalbumin at last visit. Pt is taking Metformin 2x daily. He has been taking Januvia for 2 weeks. Pt has not been checking his BS at home the past couple of days.   2. HTN Stable at last visit.  Lab [name]  Component Value Date   CREATININE 0.89 12/13/2014  Pt checks his BP at home and states it has been running in the normal range. He denies any chest pain, SOB, HA, lightheadedness or dizziness.  3. Hyperlipidemia He is on pravaostatin 200 mg QD. Lab [name]  Component Value Date   CHOL 114 01/03/2015   HDL 39* 01/03/2015   LDLCALC 54 01/03/2015   TRIG 103 01/03/2015   CHOLHDL 2.9 01/03/2015  Controlled on current regiment. He states the current medication is working well and he denies any joint pains or other side effects.   4. Hepatocellular carcinoma Followed by Dr. Learta Codding. Most recent AFP was normal in January 2016. Future order pending. LFT's normal in April   5. Immunizations Immunization History  Administered Date(s) Administered  . Influenza Split 06/02/2012  . Influenza,inj,Quad PF,36+ Mos 06/01/2013, 04/19/2014  . Pneumococcal Polysaccharide-23 08/03/2011, 06/02/2012  Due for Prevnar for pneumonia vaccination and will receive this today  Pt is currently good on medication refills   Patient Active Problem List   Diagnosis Date Noted  . 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 Altered mental status 03/05/2013  . Respiratory failure, post-operative 03/02/2013  . Hepatitis B 02/24/2013  . Cancer, hepatocellular 02/10/2013  . Liver tumor-bleeding 01/15/2013  . Epidermal cyst 06/18/2012  . OSA (obstructive sleep apnea) 03/13/2012  . Abnormal leg movement 12/12/2011  . Blood pressure elevated 09/06/2011  . Diabetes mellitus 09/06/2011  . 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 BPH (benign prostatic hyperplasia) 09/06/2011  . Diverticula of colon 09/06/2011   Past Medical History  Diagnosis Date  . Diabetes mellitus without complication   . Hypertension   . Hyperlipidemia   . Cancer     hepatocellular cancer   . Hepatitis   . Narcolepsy     per office visit note of 08/2011   . Sleep apnea    Past Surgical History  Procedure Laterality Date  . Pinched nerve in back    . Laparoscopy  03/02/2013    Procedure: LAPAROSCOPY DIAGNOSTIC;  Surgeon: [name], MD;  Location: [facility];  Service: General;;  . Liver ultrasound  03/02/2013    Procedure: LIVER ULTRASOUND;  Surgeon: [name], MD;  Location: [facility];  Service: General;;  . Open partial hepatectomy [83]  03/02/2013    Procedure: OPEN PARTIAL HEPATECTOMY [83];  Surgeon: [name], MD;  Location: [facility];  Service: General;;  DX LAPAROSCOPY, INTRAOPERATIVE LIVER ULTRASOUND, OPEN PARTIAL HEPATECTOMY   Allergies  Allergen Reactions  . Ace  Inhibitors Swelling    Angioedema - face.    Prior to Admission medications   Medication Sig Start Date End Date Taking? Authorizing Provider  alfuzosin (UROXATRAL) 10 MG 24 hr tablet Take 10 mg by mouth at bedtime.  12/19/12  Yes Historical Provider, MD  amLODipine (NORVASC) 5 MG tablet Take 1 tablet (5 mg total) by mouth daily. 01/06/15  Yes Wendie Agreste, MD  amphetamine-dextroamphetamine (ADDERALL XR) 30 MG 24 hr capsule Take 1 capsule (30 mg total) by mouth every morning. 01/31/15  Yes Larey Seat, MD    aspirin 81 MG tablet Take 81 mg by mouth every morning.    Yes Historical Provider, MD  losartan (COZAAR) 25 MG tablet Take 1 tablet (25 mg total) by mouth daily. 01/06/15 01/06/16 Yes Wendie Agreste, MD  metFORMIN (GLUCOPHAGE) 1000 MG tablet TAKE ONE TABLET BY MOUTH TWICE DAILY WITH A MEAL 01/06/15  Yes Wendie Agreste, MD  metoprolol succinate (TOPROL-XL) 50 MG 24 hr tablet Take 1 tablet (50 mg total) by mouth daily. 04/19/14  Yes Wendie Agreste, MD  pravastatin (PRAVACHOL) 20 MG tablet Take 1 tablet (20 mg total) by mouth at bedtime. 01/04/15  Yes Wendie Agreste, MD  sitaGLIPtin (JANUVIA) 100 MG tablet Take 1 tablet (100 mg total) by mouth daily. 12/13/14  Yes Wendie Agreste, MD  spironolactone (ALDACTONE) 25 MG tablet Take 1 tablet (25 mg total) by mouth 2 (two) times daily. 01/04/15  Yes Wendie Agreste, MD   History   Social History  . Marital Status: Single    Spouse Name: N/A  . Number of Children: 2  . Years of Education: 15+   Occupational History  . Not on file.   Social History Main Topics  . Smoking status: Former Research scientist (life sciences)  . Smokeless tobacco: Never Used  . Alcohol Use: No  . Drug Use: No  . Sexual Activity: Yes   Other Topics Concern  . Not on file   Social History Narrative   Patient is single and lives alone- became widower when 1 child was 41 year old, divorced 2nd marriage   Has #2 grown children, not in area or involved in his life   Patient is retired.   Patient has a college education.   Patient is right-handed.   Patient drinks maybe two sodas daily.    Review of Systems  Constitutional: Negative for fatigue and unexpected weight change.  Eyes: Negative for visual disturbance.  Respiratory: Negative for cough, chest tightness and shortness of breath.   Cardiovascular: Negative for chest pain, palpitations and leg swelling.  Gastrointestinal: Negative for abdominal pain and blood in stool.  Neurological: Negative for dizziness, light-headedness and  headaches.       Objective:   Physical Exam  Constitutional: He is oriented to person, place, and time. He appears well-developed and well-nourished.  HENT:  Head: Normocephalic and atraumatic.  Eyes: EOM are normal. Pupils are equal, round, and reactive to light.  Neck: No JVD present. Carotid bruit is not present.  Cardiovascular: Normal rate, regular rhythm and normal heart sounds.   No murmur heard. Pulmonary/Chest: Effort normal and breath sounds normal. He has no rales.  Musculoskeletal: He exhibits no edema.  Neurological: He is alert and oriented to person, place, and time.  Skin: Skin is warm and dry.  Psychiatric: He has a normal mood and affect.  Vitals reviewed.    Filed Vitals:   03/14/15 1521  BP: 104/66  Pulse: 73  Temp:  98.4 F (36.9 C)  TempSrc: Oral  Resp: 16  Height: 5\' 10"  (1.778 m)  Weight: 206 lb 9.6 oz (93.713 kg)  SpO2: 98%   [name] for orders placed or performed in visit on 03/14/15  POCT glycosylated hemoglobin (Hb A1C)  Result Value Ref Range   Hemoglobin A1C 8.7   POCT glucose (manual entry)  Result Value Ref Range   POC Glucose 141 (A) 70 - 99 mg/dl       Assessment & Plan:   [name] is a 69 y.o. [name] Need for pneumococcal vaccination - Plan: Pneumococcal conjugate vaccine 13-valent IM given.  Type 2 diabetes mellitus without complication - Plan: POCT glycosylated hemoglobin (Hb A1C), POCT glucose (manual entry), sitaGLIPtin (JANUVIA) 100 MG tablet, metFORMIN (GLUCOPHAGE) 1000 MG tablet  -  Anticipate possible addition or change in medications, but as he has not been taking his Januvia but just a few weeks, can plan on recheck A1c in 3 months to determine if this is needed. In the meantime if he runs over 200s persistently, return sooner to discuss changes in medications besides just Januvia.  Essential hypertension - Plan: spironolactone (ALDACTONE) 25 MG tablet, metoprolol succinate (TOPROL-XL) 50 MG 24 hr  tablet  -Stable, no changes in medications at this time.  Hepatocellular carcinoma  -Continue follow-up with AFP monitoring and his oncologist.  Hyperlipidemia - Plan: pravastatin (PRAVACHOL) 20 MG tablet  -Stable labs few months ago. No changes in medications.  Meds ordered this encounter  Medications  . spironolactone (ALDACTONE) 25 MG tablet    Sig: Take 1 tablet (25 mg total) by mouth 2 (two) times daily.    Dispense:  180 tablet    Refill:  1  . sitaGLIPtin (JANUVIA) 100 MG tablet    Sig: Take 1 tablet (100 mg total) by mouth daily.    Dispense:  90 tablet    Refill:  1  . pravastatin (PRAVACHOL) 20 MG tablet    Sig: Take 1 tablet (20 mg total) by mouth at bedtime.    Dispense:  90 tablet    Refill:  1  . metoprolol succinate (TOPROL-XL) 50 MG 24 hr tablet    Sig: Take 1 tablet (50 mg total) by mouth daily.    Dispense:  90 tablet    Refill:  1  . metFORMIN (GLUCOPHAGE) 1000 MG tablet    Sig: TAKE ONE TABLET BY MOUTH TWICE DAILY WITH A MEAL    Dispense:  180 tablet    Refill:  1   Patient Instructions  Your diabetes is still not under control, but difficult to say if you need additional medication as only on [place] for a few weeks. Continue same medicines for now, recheck in 3 months, then may be changing medicine if hemoglobin A1c over 7.  If your blood sugars are reading over 200 before then - return sooner and we can make changes sooner.       I personally performed the services described in this documentation, which was scribed in my presence. The recorded information has been reviewed and considered, and addended by me as needed.

## 2015-03-14 NOTE — Patient Instructions (Signed)
Your diabetes is still not under control, but difficult to say if you need additional medication as only on januvia for a few weeks. Continue same medicines for now, recheck in 3 months, then may be changing medicine if hemoglobin A1c over 7.  If your blood sugars are reading over 200 before then - return sooner and we can make changes sooner.

## 2015-04-11 ENCOUNTER — Other Ambulatory Visit: Payer: Self-pay | Admitting: Family Medicine

## 2015-06-04 DIAGNOSIS — H11153 Pinguecula, bilateral: Secondary | ICD-10-CM | POA: Diagnosis not present

## 2015-06-04 DIAGNOSIS — H40013 Open angle with borderline findings, low risk, bilateral: Secondary | ICD-10-CM | POA: Diagnosis not present

## 2015-06-04 DIAGNOSIS — H524 Presbyopia: Secondary | ICD-10-CM | POA: Diagnosis not present

## 2015-06-04 DIAGNOSIS — E119 Type 2 diabetes mellitus without complications: Secondary | ICD-10-CM | POA: Diagnosis not present

## 2015-06-04 DIAGNOSIS — H04123 Dry eye syndrome of bilateral lacrimal glands: Secondary | ICD-10-CM | POA: Diagnosis not present

## 2015-06-13 ENCOUNTER — Other Ambulatory Visit: Payer: Self-pay | Admitting: Nurse Practitioner

## 2015-06-13 ENCOUNTER — Other Ambulatory Visit: Payer: Self-pay | Admitting: Family Medicine

## 2015-06-13 ENCOUNTER — Telehealth: Payer: Self-pay | Admitting: Nurse Practitioner

## 2015-06-13 ENCOUNTER — Encounter: Payer: Self-pay | Admitting: Family Medicine

## 2015-06-13 ENCOUNTER — Ambulatory Visit (INDEPENDENT_AMBULATORY_CARE_PROVIDER_SITE_OTHER): Payer: Medicare Other | Admitting: Family Medicine

## 2015-06-13 VITALS — BP 132/73 | HR 70 | Temp 97.8°F | Resp 16 | Ht 70.0 in | Wt 212.0 lb

## 2015-06-13 DIAGNOSIS — E119 Type 2 diabetes mellitus without complications: Secondary | ICD-10-CM

## 2015-06-13 DIAGNOSIS — I10 Essential (primary) hypertension: Secondary | ICD-10-CM | POA: Diagnosis not present

## 2015-06-13 DIAGNOSIS — C220 Liver cell carcinoma: Secondary | ICD-10-CM

## 2015-06-13 DIAGNOSIS — Z23 Encounter for immunization: Secondary | ICD-10-CM

## 2015-06-13 DIAGNOSIS — L821 Other seborrheic keratosis: Secondary | ICD-10-CM

## 2015-06-13 LAB — HEMOGLOBIN A1C: Hgb A1c MFr Bld: 7.3 % — AB (ref 4.0–6.0)

## 2015-06-13 LAB — GLUCOSE, POCT (MANUAL RESULT ENTRY): POC Glucose: 179 mg/dl — AB (ref 70–99)

## 2015-06-13 LAB — POCT GLYCOSYLATED HEMOGLOBIN (HGB A1C): Hemoglobin A1C: 7.3

## 2015-06-13 MED ORDER — AMLODIPINE BESYLATE 5 MG PO TABS: 5.0000 mg | ORAL_TABLET | Freq: Every day | ORAL | Status: DC

## 2015-06-13 MED ORDER — SITAGLIPTIN PHOSPHATE 100 MG PO TABS: 100.0000 mg | ORAL_TABLET | Freq: Every day | ORAL | Status: DC

## 2015-06-13 NOTE — Telephone Encounter (Signed)
per pof to sch pt appt-gave pt copy of avs °

## 2015-06-13 NOTE — Progress Notes (Addendum)
Subjective:  This chart was scribed for [name], MD by Thea Alken, ED Scribe. This patient was seen in room 21 and the patient's care was started at 8:06 AM.   Patient ID: [name], male    DOB: 23-Jan-1946, 69 y.o.   MRN: [id number]  HPI Chief Complaint  Patient presents with  . Follow-up  . Hypertension  . Diabetes  . Medication Refill    pended   HPI Comments: [name] is a 69 y.o. male who presents to the [facility] for 3 month follow up.  Last ov 7/25.  Diabetes  Lab Results  Component Value Date   HGBA1C 8.7 03/14/2015   Lab Results  Component Value Date   MICROALBUR 0.3 12/13/2014    A1C had increased to 8.7. He had not been taking his Januvia. Discussed returning sooner if home reading over 200. Continued on januvia 100 mg and metformin 1000 mg twice daily. Pt did not bring home reading. He does not check sugars often. He has been tolerant with both medication and asymptomatic.  Hypertension  Stable last visit. Lab Results  Component Value Date   CREATININE 0.89 12/13/2014   He checks his BP occasionally which usually run around 140/60-72.    Hyperlipidemia Lab Results  Component Value Date   CHOL 114 01/03/2015   HDL 39* 01/03/2015   LDLCALC 54 01/03/2015   TRIG 103 01/03/2015   CHOLHDL 2.9 01/03/2015   Lab Results  Component Value Date   ALT 34 12/13/2014   AST 35 12/13/2014   ALKPHOS 77 12/13/2014   BILITOT 0.7 12/13/2014   Hx of hepatocellular carcinoma  followed by Dr. Learta Codding. Last AFP was normal at 3.3. Liver test normal in April.  Last ov was 08/2014 with plan to follow up in 6 months. Next appointment with Dr. Learta Codding is next month.  Abnormality on right leg He was seen for what was listed 3cm but was supposed to be 15mm suspected Seborrheic keratosison on right lower leg January 18 2014. He was advised if any enlargement or changes to return to office for possible biopsy/removal. Pt has notice  area has gotten slight larger over the past 2-3 months. He denies area bleeding or draining.    Pt is fasting.  Patient Active Problem List   Diagnosis Date Noted  . 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 Altered mental status 03/05/2013  . Respiratory failure, post-operative ([place]) 03/02/2013  . Hepatitis B 02/24/2013  . Cancer, hepatocellular ([place]) 02/10/2013  . Liver tumor-bleeding 01/15/2013  . Epidermal cyst 06/18/2012  . OSA (obstructive sleep apnea) 03/13/2012  . Abnormal leg movement 12/12/2011  . Blood pressure elevated 09/06/2011  . Diabetes mellitus 09/06/2011  . 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 BPH (benign prostatic hyperplasia) 09/06/2011  . Diverticula of colon 09/06/2011   Past Medical History  Diagnosis Date  . Diabetes mellitus without complication ([place])   . Hypertension   . Hyperlipidemia   . Cancer [facility])     hepatocellular cancer   . Hepatitis   . Narcolepsy     per office visit note of 08/2011   . Sleep apnea    Past Surgical History  Procedure Laterality Date  . Pinched nerve in back    . Laparoscopy  03/02/2013    Procedure: LAPAROSCOPY DIAGNOSTIC;  Surgeon: [name], MD;  Location: [facility];  Service: General;;  . Liver ultrasound  03/02/2013    Procedure: LIVER ULTRASOUND;  Surgeon: [name], MD;  Location: [facility];  Service: General;;  . Open partial hepatectomy [83]  03/02/2013    Procedure: OPEN PARTIAL HEPATECTOMY [83];  Surgeon: [name], MD;  Location: [facility];  Service: General;;  DX LAPAROSCOPY, INTRAOPERATIVE LIVER ULTRASOUND, OPEN PARTIAL HEPATECTOMY   Allergies  Allergen Reactions  . Ace Inhibitors Swelling    Angioedema - face.    Prior to Admission medications   Medication Sig Start Date End Date Taking? Authorizing Provider  alfuzosin (UROXATRAL) 10 MG 24 hr tablet Take 10 mg by mouth at bedtime.  12/19/12  Yes Historical Provider, MD  amLODipine (NORVASC) 5 MG tablet Take 1 tablet (5 mg total) by mouth daily. 01/06/15  Yes  Wendie Agreste, MD  amphetamine-dextroamphetamine (ADDERALL XR) 30 MG 24 hr capsule Take 1 capsule (30 mg total) by mouth every morning. 01/31/15  Yes Larey Seat, MD  aspirin 81 MG tablet Take 81 mg by mouth every morning.    Yes Historical Provider, MD  losartan (COZAAR) 25 MG tablet Take 1 tablet (25 mg total) by mouth daily. 01/06/15 01/06/16 Yes Wendie Agreste, MD  metFORMIN (GLUCOPHAGE) 1000 MG tablet TAKE ONE TABLET BY MOUTH TWICE DAILY WITH A MEAL 03/14/15  Yes Wendie Agreste, MD  metoprolol succinate (TOPROL-XL) 50 MG 24 hr tablet Take 1 tablet (50 mg total) by mouth daily. 03/14/15  Yes Wendie Agreste, MD  pravastatin (PRAVACHOL) 20 MG tablet Take 1 tablet (20 mg total) by mouth at bedtime. 03/14/15  Yes Wendie Agreste, MD  sitaGLIPtin (JANUVIA) 100 MG tablet Take 1 tablet (100 mg total) by mouth daily. 03/14/15  Yes Wendie Agreste, MD  spironolactone (ALDACTONE) 25 MG tablet Take 1 tablet (25 mg total) by mouth 2 (two) times daily. 03/14/15  Yes Wendie Agreste, MD   Social History   Social History  . Marital Status: Single    Spouse Name: N/A  . Number of Children: 2  . Years of Education: 15+   Occupational History  . Not on file.   Social History Main Topics  . Smoking status: Former Research scientist (life sciences)  . Smokeless tobacco: Never Used  . Alcohol Use: No  . Drug Use: No  . Sexual Activity: Yes   Other Topics Concern  . Not on file   Social History Narrative   Patient is single and lives alone- became widower when 1 child was 70 year old, divorced 2nd marriage   Has #2 grown children, not in area or involved in his life   Patient is retired.   Patient has a college education.   Patient is right-handed.   Patient drinks maybe two sodas daily.   Review of Systems  Constitutional: Negative for fatigue and unexpected weight change.  Eyes: Negative for visual disturbance.  Respiratory: Negative for cough, chest tightness and shortness of breath.   Cardiovascular:  Negative for chest pain, palpitations and leg swelling.  Gastrointestinal: Negative for abdominal pain and blood in stool.  Neurological: Negative for dizziness, light-headedness and headaches.       Objective:   Physical Exam  Constitutional: He is oriented to person, place, and time. He appears well-developed and well-nourished.  HENT:  Head: Normocephalic and atraumatic.  Eyes: EOM are normal. Pupils are equal, round, and reactive to light.  Neck: No JVD present. Carotid bruit is not present.  Cardiovascular: Normal rate, regular rhythm and normal heart sounds.   No murmur heard. Pulmonary/Chest: Effort normal and breath sounds normal. He has no rales.  Musculoskeletal: He  exhibits no edema.  Neurological: He is alert and oriented to person, place, and time.  Skin: Skin is warm and dry.  Right lateral upper calf dark/ hyperpigmented elevated lesion 2mm x 61mm. No surround erythema or skin change.   Psychiatric: He has a normal mood and affect.  Vitals reviewed.   Filed Vitals:   06/13/15 0755  BP: 132/73  Pulse: 70  Temp: 97.8 F (36.6 C)  Resp: 16  Height: 5\' 10"  (1.778 m)  Weight: 212 lb (96.163 kg)   Results for orders placed or performed in visit on 06/13/15  POCT glycosylated hemoglobin (Hb A1C)  Result Value Ref Range   Hemoglobin A1C 7.3   POCT glucose (manual entry)  Result Value Ref Range   POC Glucose 179 (A) 70 - 99 mg/dl      Assessment & Plan:   [name] is a 69 y.o. male Need for prophylactic vaccination and inoculation against influenza - Plan: Flu Vaccine QUAD 36+ mos IM  - flu vaccine given.   Essential hypertension - Plan: amLODipine (NORVASC) 5 MG tablet, COMPLETE METABOLIC PANEL WITH GFR, Lipid panel  - stable. No med changes.   Controlled type 2 diabetes mellitus without complication, without long-term current use of insulin (HCC) - Plan: sitaGLIPtin (JANUVIA) 100 MG tablet, POCT glycosylated hemoglobin (Hb A1C), POCT glucose  (manual entry), COMPLETE METABOLIC PANEL WITH GFR, Lipid panel  - improved control and goal of under 7 or possibly 7.5 based on age.   -continue Januvia at same dose, metformin at same dose and work on diet and weight changes.   Seborrheic keratosis  -suspected Seb K on outside of leg. Does not appear concerning, and does not appear to be significantly changed, but offered excision/biopsy. Deferred until next ov, but advised to return anytime sooner if he would like to have that done.   Advised to call oncologist to schedule follow up as plan for 6 months at January OV.  Follow up with me in 3 months.   Meds ordered this encounter  Medications  . amLODipine (NORVASC) 5 MG tablet    Sig: Take 1 tablet (5 mg total) by mouth daily.    Dispense:  90 tablet    Refill:  1  . sitaGLIPtin (JANUVIA) 100 MG tablet    Sig: Take 1 tablet (100 mg total) by mouth daily.    Dispense:  90 tablet    Refill:  1   Patient Instructions  Follow up as planned, and can biopsy the area on your leg whenever you are ready.  Keep follow up as planned with Dr. Benay Spice.  Your blood sugar is better controlled and closer to goal of under 7.5. No further changes to meds for now - watch diet.  You should receive a call or letter about your lab results within the next week to 10 days.  Follow up in 3 months.       By signing my name below, I, Raven Small, attest that this documentation has been prepared under the direction and in the presence of [name], MD.  Electronically Signed: Thea Alken, ED Scribe. 06/13/2015. 8:39 AM.   I personally performed the services described in this documentation, which was scribed in my presence. The recorded information has been reviewed and considered, and addended by me as needed.

## 2015-06-13 NOTE — Patient Instructions (Addendum)
Follow up as planned, and can biopsy the area on your leg whenever you are ready.  Keep follow up as planned with Dr. Benay Spice.  Your blood sugar is better controlled and closer to goal of under 7.5. No further changes to meds for now - watch diet.  You should receive a call or letter about your lab results within the next week to 10 days.  Follow up in 3 months.

## 2015-06-14 LAB — LIPID PANEL
Cholesterol: 122 mg/dL — ABNORMAL LOW (ref 125–200)
HDL: 47 mg/dL (ref >=40)
LDL Cholesterol: 56 mg/dL (ref <130)
Total CHOL/HDL Ratio: 2.6 Ratio (ref <=5.0)
Triglycerides: 97 mg/dL (ref <150)
VLDL: 19 mg/dL (ref <30)

## 2015-06-14 LAB — COMPLETE METABOLIC PANEL WITH GFR
ALT: 30 U/L (ref 9–46)
AST: 32 U/L (ref 10–35)
Albumin: 4.1 g/dL (ref 3.6–5.1)
Alkaline Phosphatase: 66 U/L (ref 40–115)
BUN: 8 mg/dL (ref 7–25)
CO2: 27 mmol/L (ref 20–31)
Calcium: 9.3 mg/dL (ref 8.6–10.3)
Chloride: 104 mmol/L (ref 98–110)
Creat: 0.89 mg/dL (ref 0.70–1.18)
GFR, Est African American: >89 mL/min (ref >=60)
GFR, Est Non African American: 87 mL/min (ref >=60)
Glucose, Bld: 164 mg/dL — ABNORMAL HIGH (ref 65–99)
Potassium: 4.3 mmol/L (ref 3.5–5.3)
Sodium: 139 mmol/L (ref 135–146)
Total Bilirubin: 0.8 mg/dL (ref 0.2–1.2)
Total Protein: 7.6 g/dL (ref 6.1–8.1)

## 2015-06-15 ENCOUNTER — Encounter: Payer: Self-pay | Admitting: Family Medicine

## 2015-07-06 DIAGNOSIS — N401 Enlarged prostate with lower urinary tract symptoms: Secondary | ICD-10-CM | POA: Diagnosis not present

## 2015-07-06 DIAGNOSIS — R3912 Poor urinary stream: Secondary | ICD-10-CM | POA: Diagnosis not present

## 2015-07-06 DIAGNOSIS — N5201 Erectile dysfunction due to arterial insufficiency: Secondary | ICD-10-CM | POA: Diagnosis not present

## 2015-07-26 ENCOUNTER — Ambulatory Visit (HOSPITAL_BASED_OUTPATIENT_CLINIC_OR_DEPARTMENT_OTHER): Payer: Medicare Other | Admitting: Oncology

## 2015-07-26 ENCOUNTER — Telehealth: Payer: Self-pay | Admitting: Oncology

## 2015-07-26 ENCOUNTER — Other Ambulatory Visit (HOSPITAL_BASED_OUTPATIENT_CLINIC_OR_DEPARTMENT_OTHER): Payer: Medicare Other

## 2015-07-26 VITALS — BP 144/73 | HR 68 | Temp 98.5°F | Resp 18 | Ht 70.0 in | Wt 216.7 lb

## 2015-07-26 DIAGNOSIS — Z8505 Personal history of malignant neoplasm of liver: Secondary | ICD-10-CM

## 2015-07-26 DIAGNOSIS — C220 Liver cell carcinoma: Secondary | ICD-10-CM

## 2015-07-26 NOTE — Telephone Encounter (Signed)
per pof to sch pt appt-gave pt copy of avs °

## 2015-07-26 NOTE — Progress Notes (Signed)
  [facility] OFFICE PROGRESS NOTE   Diagnosis: Hepatocellular carcinoma  INTERVAL HISTORY:   [name] returns as scheduled. He feels well. Good appetite. No complaint.  Objective:  Vital signs in last 24 hours:  Blood pressure 144/73, pulse 68, temperature 98.5 F (36.9 C), resp. rate 18, height 5\' 10"  (1.778 m), weight 216 lb 11.2 oz (98.294 kg), SpO2 100 %.    HEENT: Neck without mass Lymphatics: No cervical, supraclavicular, axillary, or inguinal nodes Resp: Lungs with decreased breath sounds and coarse rhonchi at the posterior bases bilaterally, no respiratory distress Cardio: Regular rate and rhythm GI: No hepatomegaly, no mass, no apparent ascites, nontender Vascular: No leg edema   Lab Results:  AFP on 09/06/2014-2.5   Imaging:  No results found.  Medications: I have reviewed the patient's current medications.  Assessment/Plan: 1. Hepatocellular carcinoma, stage I (T1 NX) status post partial left hepatectomy 03/02/2013.  08/31/2013 AFP 2.4.   CT abdomen 08/31/2013 with postoperative changes of partial hepatectomy with small postoperative fluid collection along the resection margin. No definite signs to suggest residual or locally recurrent disease. Several borderline enlarged and minimally enlarged lymph nodes superior to the liver in the juxtapericardiac fat of the lower anterior mediastinum with the largest measuring 11 mm slightly increased compared to the prior study 01/15/2013. Several prominent non-pathologically enlarged upper abdominal ligament lymph nodes similar to the prior study. Small esophageal varices.   CT abdomen 03/03/2012 without evidence of recurrent hepatocellular carcinoma, stable right lung base nodule  CT the abdomen and pelvis 09/06/2014 without evidence of recurrent hepatocellular carcinoma, slight enlargement of pre-cardiac lymph nodes 2. Cirrhosis. 3. Hepatitis B core antibody  positive. 4. Diabetes.   Disposition:  [name] remains in clinical remission from the hepatocellular carcinoma. He will schedule an appointment with Dr. Barry Dienes. [name] will be scheduled for a surveillance CT in January 2017. He will return for an office visit here in 8 months. We will follow-up on the AFP from today.  Betsy Coder, MD  07/26/2015  8:40 AM

## 2015-07-26 NOTE — Telephone Encounter (Signed)
per pof to sch pt appt-gave pt copy of avs-gave contrast-adv central sch will call to sch appt

## 2015-07-27 LAB — AFP TUMOR MARKER: AFP-Tumor Marker: 2.9 ng/mL (ref <6.1)

## 2015-07-28 ENCOUNTER — Telehealth: Payer: Self-pay | Admitting: *Deleted

## 2015-07-28 NOTE — Telephone Encounter (Signed)
-----   Message from Ladell Pier, MD sent at 07/27/2015  7:46 PM EST ----- Please call patient, afp is normal

## 2015-07-28 NOTE — Telephone Encounter (Signed)
Per Dr. Benay Spice; notified pt that afp is normal.  Pt verbalized understanding and expressed appreciation.

## 2015-08-01 ENCOUNTER — Encounter: Payer: Self-pay | Admitting: Adult Health

## 2015-08-01 ENCOUNTER — Ambulatory Visit (INDEPENDENT_AMBULATORY_CARE_PROVIDER_SITE_OTHER): Payer: Medicare Other | Admitting: Adult Health

## 2015-08-01 VITALS — BP 139/77 | HR 71 | Ht 70.0 in | Wt 217.0 lb

## 2015-08-01 DIAGNOSIS — G4733 Obstructive sleep apnea (adult) (pediatric): Secondary | ICD-10-CM | POA: Diagnosis not present

## 2015-08-01 DIAGNOSIS — G47411 Narcolepsy with cataplexy: Secondary | ICD-10-CM

## 2015-08-01 MED ORDER — AMPHETAMINE-DEXTROAMPHET ER 30 MG PO CP24: 30.0000 mg | ORAL_CAPSULE | Freq: Every morning | ORAL | Status: DC

## 2015-08-01 NOTE — Progress Notes (Signed)
I agree with the assessment and plan as directed by NP .The patient is known to me .   Leza Apsey, MD  

## 2015-08-01 NOTE — Patient Instructions (Signed)
Continue Adderall XR 30 mg-- refill sent If your symptoms worsen or you develop new symptoms please let us know.

## 2015-08-01 NOTE — Progress Notes (Signed)
PATIENT: [name] DOB: 12-May-1946  REASON FOR VISIT: follow up-  Narcolepsy, obstructive sleep apnea HISTORY FROM: patient  HISTORY OF PRESENT ILLNESS:  [name] is a 69 -year-old male with a history of narcolepsy and obstructive sleep apnea. He returns today for follow-up. The patient is currently only taking Adderall extended release for his narcolepsy. In the past he has been on Xyrem but due to the price he discontinued this medication. He also states today that he is very nervous about Xyrem. He has no desire to restart this medication. The patient feels that his narcolepsy is controlled well enough with Adderall. He no longer uses his CPAP. The patient's Epworth sleepiness score is 13 and fatigue severity score is 36. The patient is currently pleased with Adderall does not want to make any adjustments to his medications. He denies any new neurological symptoms. He returns today for an evaluation.  HISTORY 01/31/15 ([place]): [name] is a very pleasant 69 y.o.-year-old Right-handed male, with an underlying medical history of hypertension, hyperlipidemia, diabetes, who presents for followup consultation of his narcolepsy as well as sleep apnea.   01-31-15  The patient is unaccompanied today. I am seeing this patient For over 10 years now and treat his hypersomnia with adderall and XYREM, He discontinued XYREM after the drug's price escalated ibuprofen have seen him last patient 06/11/2012 at which time he indicated that he no longer uses a CPAP machine.  He has sleep study in 2012 which showed moderate obstructive sleep apnea and was titrated on CPAP of 9 cm of water pressure. He had residual sleep apnea findings without pressure and she increased his pressure to 10 cm which improved his residual AHI. While he was compliant with CPAP at the time he had problems with co-pays and indicated that he could not afford CPAP. At some point he returned a CPAP machine to  his home health company.  He is willing to get restarted on treatment. He has a history of narcolepsy and indicates no recent cataplectic attacks. He was tried on Provigil which did not help sleepiness. He was last on Xyrem 4.5 g and 3 g each night In November 2015 , and he continued on Adderall XR 30 mg daily. He feels more sleepy since Xyrem stopped. Based on the recent liver surgery will need to review LFT every 3 month if patient stays on XYREM. He has Insomnia now, on XR adderall, which he never had before.  I will refill medications and he does require any refills today. He indicates no new problems. He feels that his adderall only medication regimen is working "good enough ".   REVIEW OF SYSTEMS: Out of a complete 14 system review of symptoms, the patient complains only of the following symptoms, and all other reviewed systems are negative.   walking difficulty, hearing loss  ALLERGIES: Allergies  Allergen Reactions  . Ace Inhibitors Swelling    Angioedema - face.     HOME MEDICATIONS: Outpatient Prescriptions Prior to Visit  Medication Sig Dispense Refill  . alfuzosin (UROXATRAL) 10 MG 24 hr tablet Take 10 mg by mouth at bedtime.     [name] Kitchen amLODipine (NORVASC) 5 MG tablet Take 1 tablet (5 mg total) by mouth daily. 90 tablet 1  . amphetamine-dextroamphetamine (ADDERALL XR) 30 MG 24 hr capsule Take 1 capsule (30 mg total) by mouth every morning. 90 capsule 0  . aspirin 81 MG tablet Take 81 mg by mouth every morning.     [name] Kitchen  losartan (COZAAR) 25 MG tablet Take 1 tablet (25 mg total) by mouth daily. 90 tablet 1  . metFORMIN (GLUCOPHAGE) 1000 MG tablet TAKE ONE TABLET BY MOUTH TWICE DAILY WITH A MEAL 180 tablet 1  . metoprolol succinate (TOPROL-XL) 50 MG 24 hr tablet Take 1 tablet (50 mg total) by mouth daily. 90 tablet 1  . pravastatin (PRAVACHOL) 20 MG tablet Take 1 tablet (20 mg total) by mouth at bedtime. 90 tablet 1  . sitaGLIPtin (JANUVIA) 100 MG tablet Take 1 tablet (100 mg  total) by mouth daily. 90 tablet 1  . spironolactone (ALDACTONE) 25 MG tablet Take 1 tablet (25 mg total) by mouth 2 (two) times daily. 180 tablet 1   No facility-administered medications prior to visit.    PAST MEDICAL HISTORY: Past Medical History  Diagnosis Date  . Diabetes mellitus without complication ([place])   . Hypertension   . Hyperlipidemia   . Cancer [facility])     hepatocellular cancer   . Hepatitis   . Narcolepsy     per office visit note of 08/2011   . Sleep apnea     PAST SURGICAL HISTORY: Past Surgical History  Procedure Laterality Date  . Pinched nerve in back    . Laparoscopy  03/02/2013    Procedure: LAPAROSCOPY DIAGNOSTIC;  Surgeon: [name], MD;  Location: [facility];  Service: General;;  . Liver ultrasound  03/02/2013    Procedure: LIVER ULTRASOUND;  Surgeon: [name], MD;  Location: [facility];  Service: General;;  . Open partial hepatectomy [83]  03/02/2013    Procedure: OPEN PARTIAL HEPATECTOMY [83];  Surgeon: [name], MD;  Location: [facility];  Service: General;;  DX LAPAROSCOPY, INTRAOPERATIVE LIVER ULTRASOUND, OPEN PARTIAL HEPATECTOMY    FAMILY HISTORY: Family History  Problem Relation Age of Onset  . Dementia Mother   . Cancer Father   . Diabetes Brother   . Hypertension Brother   . Cancer Brother     SOCIAL HISTORY: Social History   Social History  . Marital Status: Single    Spouse Name: N/A  . Number of Children: 2  . Years of Education: 15+   Occupational History  . Not on file.   Social History Main Topics  . Smoking status: Former Research scientist (life sciences)  . Smokeless tobacco: Never Used  . Alcohol Use: No  . Drug Use: No  . Sexual Activity: Yes   Other Topics Concern  . Not on file   Social History Narrative   Patient is single and lives alone- became widower when 1 child was 62 year old, divorced 2nd marriage   Has #2 grown children, not in area or involved in his life   Patient is retired.   Patient has a college education.   Patient is  right-handed.   Patient drinks maybe two sodas daily.      PHYSICAL EXAM  Filed Vitals:   08/01/15 0857  BP: 139/77  Pulse: 71  Height: 5\' 10"  (1.778 m)  Weight: 217 lb (98.431 kg)   Body mass index is 31.14 kg/(m^2).  Generalized: Well developed, in no acute distress   Neurological examination  Mentation: Alert oriented to time, place, history taking. Follows all commands speech and language fluent Cranial nerve II-XII: Pupils were equal round reactive to light. Extraocular movements were full, visual field were full on confrontational test. Facial sensation and strength were normal. Uvula tongue midline. Head turning and shoulder shrug  were normal and symmetric. Motor: The motor testing reveals 5 over  5 strength of all 4 extremities. Good symmetric motor tone is noted throughout.  Sensory: Sensory testing is intact to soft touch on all 4 extremities. No evidence of extinction is noted.  Coordination: Cerebellar testing reveals good finger-nose-finger and heel-to-shin bilaterally.  Gait and station: Gait is normal. Tandem gait is normal. Romberg is negative. No drift is seen.  Reflexes: Deep tendon reflexes are symmetric and normal bilaterally.   DIAGNOSTIC DATA (LABS, IMAGING, TESTING) - I reviewed patient records, labs, notes, testing and imaging myself where available.  Lab Results  Component Value Date   WBC 9.5 03/08/2013   HGB 11.1* 03/08/2013   HCT 34.3* 03/08/2013   MCV 85.5 03/08/2013   PLT 215 03/08/2013      Component Value Date/Time   NA 139 06/13/2015 0842   NA 139 09/06/2014 0750   NA 137 11/11/2013 1300   K 4.3 06/13/2015 0842   K 4.3 09/06/2014 0750   CL 104 06/13/2015 0842   CO2 27 06/13/2015 0842   CO2 23 09/06/2014 0750   GLUCOSE 164* 06/13/2015 0842   GLUCOSE 235* 09/06/2014 0750   GLUCOSE 279* 11/11/2013 1300   BUN 8 06/13/2015 0842   BUN 12.9 09/06/2014 0750   BUN 10 11/11/2013 1300   CREATININE 0.89 06/13/2015 0842   CREATININE 1.1  09/06/2014 0750   CREATININE 0.95 11/11/2013 1300   CALCIUM 9.3 06/13/2015 0842   CALCIUM 8.8 09/06/2014 0750   PROT 7.6 06/13/2015 0842   PROT 7.8 03/03/2014 0754   PROT 7.7 11/11/2013 1300   ALBUMIN 4.1 06/13/2015 0842   ALBUMIN 3.7 03/03/2014 0754   ALBUMIN 4.2 11/11/2013 1300   AST 32 06/13/2015 0842   AST 36* 03/03/2014 0754   ALT 30 06/13/2015 0842   ALT 32 03/03/2014 0754   ALKPHOS 66 06/13/2015 0842   ALKPHOS 77 03/03/2014 0754   BILITOT 0.8 06/13/2015 0842   BILITOT 0.75 03/03/2014 0754   GFRNONAA 87 06/13/2015 0842   GFRNONAA 81 11/11/2013 1300   GFRAA >89 06/13/2015 0842   GFRAA 94 11/11/2013 1300    ASSESSMENT AND PLAN 69 y.o. year old male  has a past medical history of Diabetes mellitus without complication ([place]); Hypertension; Hyperlipidemia; Cancer ([place]); Hepatitis; Narcolepsy; and Sleep apnea. here with:  1.  Narcolepsy  2. Sleep apnea   Overall the patient feels that he is doing well on Adderall extended release 30 mg daily. He is not interested in restarting Xyrem or restarting his CPAP. Refill has been given for Adderall.  Patient advised that if his symptoms worsen or he develops new symptoms he should let us know. He will follow-up in 6 months or sooner if needed.     Ward Givens, MSN, NP-C 08/01/2015, 9:02 AM [facility] [address], [place], [place] [address] [phone]

## 2015-08-16 ENCOUNTER — Telehealth: Payer: Self-pay | Admitting: Family Medicine

## 2015-08-23 NOTE — Telephone Encounter (Signed)
ERROR

## 2015-09-07 ENCOUNTER — Other Ambulatory Visit (HOSPITAL_BASED_OUTPATIENT_CLINIC_OR_DEPARTMENT_OTHER): Payer: Medicare Other

## 2015-09-07 ENCOUNTER — Encounter (HOSPITAL_COMMUNITY): Payer: Self-pay

## 2015-09-07 ENCOUNTER — Ambulatory Visit (HOSPITAL_COMMUNITY)
Admission: RE | Admit: 2015-09-07 | Discharge: 2015-09-07 | Disposition: A | Discharge status: Home / Self Care | Payer: Medicare Other | Source: Ambulatory Visit | Attending: Oncology | Admitting: Oncology

## 2015-09-07 DIAGNOSIS — R911 Solitary pulmonary nodule: Secondary | ICD-10-CM | POA: Insufficient documentation

## 2015-09-07 DIAGNOSIS — I700 Atherosclerosis of aorta: Secondary | ICD-10-CM | POA: Diagnosis not present

## 2015-09-07 DIAGNOSIS — D3501 Benign neoplasm of right adrenal gland: Secondary | ICD-10-CM | POA: Diagnosis not present

## 2015-09-07 DIAGNOSIS — K766 Portal hypertension: Secondary | ICD-10-CM | POA: Diagnosis not present

## 2015-09-07 DIAGNOSIS — I8500 Esophageal varices without bleeding: Secondary | ICD-10-CM | POA: Insufficient documentation

## 2015-09-07 DIAGNOSIS — K8020 Calculus of gallbladder without cholecystitis without obstruction: Secondary | ICD-10-CM | POA: Insufficient documentation

## 2015-09-07 DIAGNOSIS — K7460 Unspecified cirrhosis of liver: Secondary | ICD-10-CM | POA: Diagnosis not present

## 2015-09-07 DIAGNOSIS — C220 Liver cell carcinoma: Secondary | ICD-10-CM

## 2015-09-07 DIAGNOSIS — K5730 Diverticulosis of large intestine without perforation or abscess without bleeding: Secondary | ICD-10-CM | POA: Diagnosis not present

## 2015-09-07 LAB — BASIC METABOLIC PANEL
Anion Gap: 10 mEq/L (ref 3–11)
BUN: 13.5 mg/dL (ref 7.0–26.0)
CO2: 23 mEq/L (ref 22–29)
Calcium: 9.4 mg/dL (ref 8.4–10.4)
Chloride: 104 mEq/L (ref 98–109)
Creatinine: 1.2 mg/dL (ref 0.7–1.3)
EGFR: 68 mL/min/{1.73_m2} — ABNORMAL LOW (ref >90)
Glucose: 221 mg/dl — ABNORMAL HIGH (ref 70–140)
Potassium: 4.7 mEq/L (ref 3.5–5.1)
Sodium: 137 mEq/L (ref 136–145)

## 2015-09-07 MED ORDER — IOHEXOL 300 MG/ML  SOLN
100.0000 mL | Freq: Once | INTRAMUSCULAR | Status: AC | PRN
  Administered 2015-09-07: 100 mL via INTRAVENOUS

## 2015-09-08 ENCOUNTER — Telehealth: Payer: Self-pay | Admitting: *Deleted

## 2015-09-08 NOTE — Telephone Encounter (Signed)
-----   Message from Ladell Pier, MD sent at 09/08/2015  4:01 PM EST ----- Please call patient, CT negative for cancer, f/u as scheduled

## 2015-09-12 ENCOUNTER — Encounter: Payer: Self-pay | Admitting: Family Medicine

## 2015-09-12 ENCOUNTER — Ambulatory Visit (INDEPENDENT_AMBULATORY_CARE_PROVIDER_SITE_OTHER): Payer: Medicare Other | Admitting: Family Medicine

## 2015-09-12 VITALS — BP 120/68 | HR 82 | Temp 98.0°F | Resp 16 | Ht 69.5 in | Wt 215.4 lb

## 2015-09-12 DIAGNOSIS — G4733 Obstructive sleep apnea (adult) (pediatric): Secondary | ICD-10-CM | POA: Diagnosis not present

## 2015-09-12 DIAGNOSIS — C220 Liver cell carcinoma: Secondary | ICD-10-CM

## 2015-09-12 DIAGNOSIS — E1165 Type 2 diabetes mellitus with hyperglycemia: Secondary | ICD-10-CM | POA: Diagnosis not present

## 2015-09-12 DIAGNOSIS — E119 Type 2 diabetes mellitus without complications: Secondary | ICD-10-CM | POA: Diagnosis not present

## 2015-09-12 DIAGNOSIS — I10 Essential (primary) hypertension: Secondary | ICD-10-CM | POA: Diagnosis not present

## 2015-09-12 DIAGNOSIS — IMO0001 Reserved for inherently not codable concepts without codable children: Secondary | ICD-10-CM

## 2015-09-12 DIAGNOSIS — G47411 Narcolepsy with cataplexy: Secondary | ICD-10-CM

## 2015-09-12 LAB — POCT GLYCOSYLATED HEMOGLOBIN (HGB A1C): Hemoglobin A1C: 9.2

## 2015-09-12 LAB — GLUCOSE, POCT (MANUAL RESULT ENTRY): POC Glucose: 197 mg/dl — AB (ref 70–99)

## 2015-09-12 MED ORDER — GLIPIZIDE 5 MG PO TABS: 2.5000 mg | ORAL_TABLET | Freq: Every day | ORAL | Status: DC

## 2015-09-12 NOTE — Patient Instructions (Addendum)
If you want to see if other types of machines available for CPAP - contact Dr. Edwena Felty office.  If cost is the issue, let me know and I can have our staff Pamala Hurry) check into other options for suppliers of CPAP in the area.   Follow up in the next 3 months for diabetes. As uncontrolled - will add low dose glipizide (1.2 pill each day), but avoid sweets and increase exercise with walking most days of the week. Watch for low blood sugar symptoms - see this below, but I do not expect this to occur.  If you are having low blood sugar readings - return sooner to discuss your meds.   Hypoglycemia Hypoglycemia occurs when the glucose in your blood is too low. Glucose is a type of sugar that is your body's main energy source. Hormones, such as insulin and glucagon, control the level of glucose in the blood. Insulin lowers blood glucose and glucagon increases blood glucose. Having too much insulin in your blood stream, or not eating enough food containing sugar, can result in hypoglycemia. Hypoglycemia can happen to people with or without diabetes. It can develop quickly and can be a medical emergency.  CAUSES   Missing or delaying meals.  Not eating enough carbohydrates at meals.  Taking too much diabetes medicine.  Not timing your oral diabetes medicine or insulin doses with meals, snacks, and exercise.  Nausea and vomiting.  Certain medicines.  Severe illnesses, such as hepatitis, kidney disorders, and certain eating disorders.  Increased activity or exercise without eating something extra or adjusting medicines.  Drinking too much alcohol.  A nerve disorder that affects body functions like your heart rate, blood pressure, and digestion (autonomic neuropathy).  A condition where the stomach muscles do not function properly (gastroparesis). Therefore, medicines and food may not absorb properly.  Rarely, a tumor of the pancreas can produce too much insulin. SYMPTOMS   Hunger.  Sweating  (diaphoresis).  Change in body temperature.  Shakiness.  Headache.  Anxiety.  Lightheadedness.  Irritability.  Difficulty concentrating.  Dry mouth.  Tingling or numbness in the hands or feet.  Restless sleep or sleep disturbances.  Altered speech and coordination.  Change in mental status.  Seizures or prolonged convulsions.  Combativeness.  Drowsiness (lethargic).  Weakness.  Increased heart rate or palpitations.  Confusion.  Pale, gray skin color.  Blurred or double vision.  Fainting. DIAGNOSIS  A physical exam and medical history will be performed. Your caregiver may make a diagnosis based on your symptoms. Blood tests and other lab tests may be performed to confirm a diagnosis. Once the diagnosis is made, your caregiver will see if your signs and symptoms go away once your blood glucose is raised.  TREATMENT  Usually, you can easily treat your hypoglycemia when you notice symptoms.  Check your blood glucose. If it is less than 70 mg/dl, take one of the following:   3-4 glucose tablets.    cup juice.    cup regular soda.   1 cup skim milk.   -1 tube of glucose gel.   5-6 hard candies.   Avoid high-fat drinks or food that may delay a rise in blood glucose levels.  Do not take more than the recommended amount of sugary foods, drinks, gel, or tablets. Doing so will cause your blood glucose to go too high.   Wait 10-15 minutes and recheck your blood glucose. If it is still less than 70 mg/dl or below your target range, repeat treatment.  Eat a snack if it is more than 1 hour until your next meal.  There may be a time when your blood glucose may go so low that you are unable to treat yourself at home when you start to notice symptoms. You may need someone to help you. You may even faint or be unable to swallow. If you cannot treat yourself, someone will need to bring you to the hospital.  [place]  If you have  diabetes, follow your diabetes management plan by:  Taking your medicines as directed.  Following your exercise plan.  Following your meal plan. Do not skip meals. Eat on time.  Testing your blood glucose regularly. Check your blood glucose before and after exercise. If you exercise longer or different than usual, be sure to check blood glucose more frequently.  Wearing your medical alert jewelry that says you have diabetes.  Identify the cause of your hypoglycemia. Then, develop ways to prevent the recurrence of hypoglycemia.  Do not take a hot bath or shower right after an insulin shot.  Always carry treatment with you. Glucose tablets are the easiest to carry.  If you are going to drink alcohol, drink it only with meals.  Tell friends or family members ways to keep you safe during a seizure. This may include removing hard or sharp objects from the area or turning you on your side.  Maintain a healthy weight. SEEK MEDICAL CARE IF:   You are having problems keeping your blood glucose in your target range.  You are having frequent episodes of hypoglycemia.  You feel you might be having side effects from your medicines.  You are not sure why your blood glucose is dropping so low.  You notice a change in vision or a new problem with your vision. SEEK IMMEDIATE MEDICAL CARE IF:   Confusion develops.  A change in mental status occurs.  The inability to swallow develops.  Fainting occurs.   This information is not intended to replace advice given to you by your health care provider. Make sure you discuss any questions you have with your health care provider.   Document Released: 08/06/2005 Document Revised: 08/11/2013 Document Reviewed: 04/12/2015 Elsevier Interactive Patient Education [employer].

## 2015-09-12 NOTE — Progress Notes (Addendum)
Subjective:  By signing my name below, I, Raven Small, attest that this documentation has been prepared under the direction and in the presence of [name], MD.  Electronically Signed: Thea Alken, ED Scribe. 09/12/2015. 9:33 AM.  Patient ID: [name], male    DOB: 06/20/1946, 70 y.o.   MRN: [id number]  HPI   Chief Complaint  Patient presents with  . Follow-up    3 mos  . Diabetes  . Hypertension   HPI Comments: [name] is a 70 y.o. male who presents to the [facility] for a follow up.   Diabetes  Lab Results  Component Value Date   HGBA1C 7.3 06/13/2015   Lab Results  Component Value Date   MICROALBUR 0.3 12/13/2014   Continue metformin and januvia. reccommended  on working on diet and weight  Wt Readings from Last 3 Encounters:  09/12/15 215 lb 6.4 oz (97.705 kg)  08/01/15 217 lb (98.431 kg)  07/26/15 216 lb 11.2 oz (98.294 kg)   Hypertension Stable at last visit. No med changes.   Lab Results  Component Value Date   CREATININE 1.2 09/07/2015   He is compliant with medication without adverse effects. His BP was 139/77 neurology and 144/73 at neurology in December.   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 Clinical remission when seen in Dec by Dr. Benay Spice. Recent APF was normal. Recent CT was also negative for cancer. He denies abdominal pain.   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 Did not want to change xyrem based on 12/12 visit with neurology. No changes in with medication in December.  OSA He is no longer using CPAP due to cost. At his neuro visit in Dec epworth sleepiness score 13, fatigue 36. Not interested in restarting CPAP at that visit.  Pt states he was being charged for using CPAP machine and could not afford this. Initially he was told there would be no charge but was then given a $500 copay. Machine was been being supplied by [facility].   Right shoulder pain Pt reports  intermittent right shoulder pain only when he is hungry. Pain is relieved after eating.   Patient Active Problem List   Diagnosis Date Noted  . 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 Altered mental status 03/05/2013  . Respiratory failure, post-operative ([place]) 03/02/2013  . Hepatitis B 02/24/2013  . Cancer, hepatocellular ([place]) 02/10/2013  . Liver tumor-bleeding 01/15/2013  . Epidermal cyst 06/18/2012  . OSA (obstructive sleep apnea) 03/13/2012  . Abnormal leg movement 12/12/2011  . Blood pressure elevated 09/06/2011  . Diabetes mellitus 09/06/2011  . 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 BPH (benign prostatic hyperplasia) 09/06/2011  . Diverticula of colon 09/06/2011   Past Medical History  Diagnosis Date  . Diabetes mellitus without complication ([place])   . Hypertension   . Hyperlipidemia   . Cancer [facility])     hepatocellular cancer   . Hepatitis   . Narcolepsy     per office visit note of 08/2011   . Sleep apnea    Past Surgical History  Procedure Laterality Date  . Pinched nerve in back    . Laparoscopy  03/02/2013    Procedure: LAPAROSCOPY DIAGNOSTIC;  Surgeon: [name], MD;  Location: [facility];  Service: General;;  . Liver ultrasound  03/02/2013    Procedure: LIVER ULTRASOUND;  Surgeon: [name], MD;  Location: [facility];  Service: General;;  . Open partial hepatectomy [83]  03/02/2013    Procedure: OPEN  PARTIAL HEPATECTOMY [83];  Surgeon: [name], MD;  Location: [facility];  Service: General;;  DX LAPAROSCOPY, INTRAOPERATIVE LIVER ULTRASOUND, OPEN PARTIAL HEPATECTOMY   Allergies  Allergen Reactions  . Ace Inhibitors Swelling    Angioedema - face.    Prior to Admission medications   Medication Sig Start Date End Date Taking? Authorizing Provider  alfuzosin (UROXATRAL) 10 MG 24 hr tablet Take 10 mg by mouth at bedtime.  12/19/12  Yes Historical Provider, MD  amLODipine (NORVASC) 5 MG tablet Take 1 tablet (5 mg total) by mouth daily. 06/13/15  Yes Wendie Agreste, MD    amphetamine-dextroamphetamine (ADDERALL XR) 30 MG 24 hr capsule Take 1 capsule (30 mg total) by mouth every morning. 08/01/15  Yes Ward Givens, NP  aspirin 81 MG tablet Take 81 mg by mouth every morning.    Yes Historical Provider, MD  losartan (COZAAR) 25 MG tablet Take 1 tablet (25 mg total) by mouth daily. 01/06/15 01/06/16 Yes Wendie Agreste, MD  metFORMIN (GLUCOPHAGE) 1000 MG tablet TAKE ONE TABLET BY MOUTH TWICE DAILY WITH A MEAL 03/14/15  Yes Wendie Agreste, MD  metoprolol succinate (TOPROL-XL) 50 MG 24 hr tablet Take 1 tablet (50 mg total) by mouth daily. 03/14/15  Yes Wendie Agreste, MD  pravastatin (PRAVACHOL) 20 MG tablet Take 1 tablet (20 mg total) by mouth at bedtime. 03/14/15  Yes Wendie Agreste, MD  sitaGLIPtin (JANUVIA) 100 MG tablet Take 1 tablet (100 mg total) by mouth daily. 06/13/15  Yes Wendie Agreste, MD  spironolactone (ALDACTONE) 25 MG tablet Take 1 tablet (25 mg total) by mouth 2 (two) times daily. 03/14/15  Yes Wendie Agreste, MD   Social History   Social History  . Marital Status: Single    Spouse Name: N/A  . Number of Children: 2  . Years of Education: 15+   Occupational History  . Not on file.   Social History Main Topics  . Smoking status: Former Research scientist (life sciences)  . Smokeless tobacco: Never Used  . Alcohol Use: No  . Drug Use: No  . Sexual Activity: Yes   Other Topics Concern  . Not on file   Social History Narrative   Patient is single and lives alone- became widower when 1 child was 81 year old, divorced 2nd marriage   Has #2 grown children, not in area or involved in his life   Patient is retired.   Patient has a college education.   Patient is right-handed.   Patient drinks maybe two sodas daily.   Review of Systems  Constitutional: Negative for fatigue and unexpected weight change.  Eyes: Negative for visual disturbance.  Respiratory: Negative for cough, chest tightness and shortness of breath.   Cardiovascular: Negative for chest pain,  palpitations and leg swelling.  Gastrointestinal: Negative for abdominal pain and blood in stool.  Neurological: Negative for dizziness, light-headedness and headaches.       Objective:   Physical Exam  Constitutional: He is oriented to person, place, and time. He appears well-developed and well-nourished.  HENT:  Head: Normocephalic and atraumatic.  Eyes: EOM are normal. Pupils are equal, round, and reactive to light.  Neck: No JVD present. Carotid bruit is not present.  Cardiovascular: Normal rate, regular rhythm and normal heart sounds.   No murmur heard. Pulmonary/Chest: Effort normal and breath sounds normal. He has no rales.  Musculoskeletal: He exhibits no edema.  Neurological: He is alert and oriented to person, place, and time.  Skin: Skin  is warm and dry.  Psychiatric: He has a normal mood and affect.  Vitals reviewed.  Filed Vitals:   09/12/15 0857 09/12/15 1002  BP: 151/74 120/68  Pulse: 82   Temp: 98 F (36.7 C)   TempSrc: Oral   Resp: 16   Height: 5' 9.5" (1.765 m)   Weight: 215 lb 6.4 oz (97.705 kg)   SpO2: 95%     Results for orders placed or performed in visit on 09/12/15  POCT glucose (manual entry)  Result Value Ref Range   POC Glucose 197 (A) 70 - 99 mg/dl  POCT glycosylated hemoglobin (Hb A1C)  Result Value Ref Range   Hemoglobin A1C 9.2     Results discussed. Did eat more sweets over the holidays. Denies missed doses of meds.   Assessment & Plan:   [name] is a 70 y.o. male Uncontrolled type 2 diabetes mellitus without complication, without long-term current use of insulin ([place]) - Plan: HM Diabetes Foot Exam, POCT glucose (manual entry), POCT glycosylated hemoglobin (Hb A1C), glipiZIDE (GLUCOTROL) 5 MG tablet  - add glipizide 2.5mg  qd, continue same doses of metformin and januvia, cut back on sweets and increase physical activity/walking for exercise. recheck 3 months. Hypoglycemia precautions with glipizide.   Essential  hypertension  - stable on recheck. No med changes.   Hepatocellular carcinoma ([place])  - clinical remission with recent AFP and CT reassuring.    OSA (obstructive sleep apnea)  -not on CPAP. Long term risks of nontreatment discussed.   -advised to call neuro to see if other apparatus may be better tolerated, or if cost is issue - could try to fin other CPAP compamy if he would like.   Narcolepsy with cataplexy  -stable on Adderall, continue follow up with neuro.  Meds ordered this encounter  Medications  . glipiZIDE (GLUCOTROL) 5 MG tablet    Sig: Take 0.5 tablets (2.5 mg total) by mouth daily before breakfast.    Dispense:  30 tablet    Refill:  3   Patient Instructions  If you want to see if other types of machines available for CPAP - contact Dr. Edwena Felty office.  If cost is the issue, let me know and I can have our staff Pamala Hurry) check into other options for suppliers of CPAP in the area.   Follow up in the next 3 months for diabetes. As uncontrolled - will add low dose glipizide (1.2 pill each day), but avoid sweets and increase exercise with walking most days of the week. Watch for low blood sugar symptoms - see this below, but I do not expect this to occur.  If you are having low blood sugar readings - return sooner to discuss your meds.   Hypoglycemia Hypoglycemia occurs when the glucose in your blood is too low. Glucose is a type of sugar that is your body's main energy source. Hormones, such as insulin and glucagon, control the level of glucose in the blood. Insulin lowers blood glucose and glucagon increases blood glucose. Having too much insulin in your blood stream, or not eating enough food containing sugar, can result in hypoglycemia. Hypoglycemia can happen to people with or without diabetes. It can develop quickly and can be a medical emergency.  CAUSES   Missing or delaying meals.  Not eating enough carbohydrates at meals.  Taking too much diabetes medicine.  Not  timing your oral diabetes medicine or insulin doses with meals, snacks, and exercise.  Nausea and vomiting.  Certain medicines.  Severe illnesses, such as hepatitis, kidney disorders, and certain eating disorders.  Increased activity or exercise without eating something extra or adjusting medicines.  Drinking too much alcohol.  A nerve disorder that affects body functions like your heart rate, blood pressure, and digestion (autonomic neuropathy).  A condition where the stomach muscles do not function properly (gastroparesis). Therefore, medicines and food may not absorb properly.  Rarely, a tumor of the pancreas can produce too much insulin. SYMPTOMS   Hunger.  Sweating (diaphoresis).  Change in body temperature.  Shakiness.  Headache.  Anxiety.  Lightheadedness.  Irritability.  Difficulty concentrating.  Dry mouth.  Tingling or numbness in the hands or feet.  Restless sleep or sleep disturbances.  Altered speech and coordination.  Change in mental status.  Seizures or prolonged convulsions.  Combativeness.  Drowsiness (lethargic).  Weakness.  Increased heart rate or palpitations.  Confusion.  Pale, gray skin color.  Blurred or double vision.  Fainting. DIAGNOSIS  A physical exam and medical history will be performed. Your caregiver may make a diagnosis based on your symptoms. Blood tests and other lab tests may be performed to confirm a diagnosis. Once the diagnosis is made, your caregiver will see if your signs and symptoms go away once your blood glucose is raised.  TREATMENT  Usually, you can easily treat your hypoglycemia when you notice symptoms.  Check your blood glucose. If it is less than 70 mg/dl, take one of the following:   3-4 glucose tablets.    cup juice.    cup regular soda.   1 cup skim milk.   -1 tube of glucose gel.   5-6 hard candies.   Avoid high-fat drinks or food that may delay a rise in blood glucose  levels.  Do not take more than the recommended amount of sugary foods, drinks, gel, or tablets. Doing so will cause your blood glucose to go too high.   Wait 10-15 minutes and recheck your blood glucose. If it is still less than 70 mg/dl or below your target range, repeat treatment.   Eat a snack if it is more than 1 hour until your next meal.  There may be a time when your blood glucose may go so low that you are unable to treat yourself at home when you start to notice symptoms. You may need someone to help you. You may even faint or be unable to swallow. If you cannot treat yourself, someone will need to bring you to the hospital.  [place]  If you have diabetes, follow your diabetes management plan by:  Taking your medicines as directed.  Following your exercise plan.  Following your meal plan. Do not skip meals. Eat on time.  Testing your blood glucose regularly. Check your blood glucose before and after exercise. If you exercise longer or different than usual, be sure to check blood glucose more frequently.  Wearing your medical alert jewelry that says you have diabetes.  Identify the cause of your hypoglycemia. Then, develop ways to prevent the recurrence of hypoglycemia.  Do not take a hot bath or shower right after an insulin shot.  Always carry treatment with you. Glucose tablets are the easiest to carry.  If you are going to drink alcohol, drink it only with meals.  Tell friends or family members ways to keep you safe during a seizure. This may include removing hard or sharp objects from the area or turning you on your side.  Maintain a healthy weight. SEEK  MEDICAL CARE IF:   You are having problems keeping your blood glucose in your target range.  You are having frequent episodes of hypoglycemia.  You feel you might be having side effects from your medicines.  You are not sure why your blood glucose is dropping so low.  You notice a change in  vision or a new problem with your vision. SEEK IMMEDIATE MEDICAL CARE IF:   Confusion develops.  A change in mental status occurs.  The inability to swallow develops.  Fainting occurs.   This information is not intended to replace advice given to you by your health care provider. Make sure you discuss any questions you have with your health care provider.   Document Released: 08/06/2005 Document Revised: 08/11/2013 Document Reviewed: 04/12/2015 Elsevier Interactive Patient Education [employer].     I personally performed the services described in this documentation, which was scribed in my presence. The recorded information has been reviewed and considered, and addended by me as needed.

## 2015-09-13 ENCOUNTER — Telehealth: Payer: Self-pay

## 2015-09-13 DIAGNOSIS — I10 Essential (primary) hypertension: Secondary | ICD-10-CM

## 2015-09-13 DIAGNOSIS — E785 Hyperlipidemia, unspecified: Secondary | ICD-10-CM

## 2015-09-13 DIAGNOSIS — E119 Type 2 diabetes mellitus without complications: Secondary | ICD-10-CM

## 2015-09-13 NOTE — Telephone Encounter (Signed)
Pt req. Refills for: (pt states he req. Refills during OV (09/12/15) but they were not ordered metFORMIN (GLUCOPHAGE) 1000 MG tablet TAKE ONE TABLET BY MOUTH TWICE DAILY WITH A MEAL 03/14/15  Yes Wendie Agreste, MD  metoprolol succinate (TOPROL-XL) 50 MG 24 hr tablet Take 1 tablet (50 mg total) by mouth daily. 03/14/15  Yes Wendie Agreste, MD  pravastatin (PRAVACHOL) 20 MG tablet Take 1 tablet (20 mg total) by mouth at bedtime. 03/14/15  Yes Wendie Agreste, MD  sitaGLIPtin (JANUVIA) 100 MG tablet Take 1 tablet (100 mg total) by mouth daily. 06/13/15  Yes Wendie Agreste, MD  spironolactone (ALDACTONE) 25 MG tablet Take 1 tablet (25 mg total) by mouth 2 (two) times daily. 03/14/15  Yes Wendie Agreste, MD         [facility] [address] [place], [place] - [address]  [phone]

## 2015-09-14 MED ORDER — PRAVASTATIN SODIUM 20 MG PO TABS
20.0000 mg | ORAL_TABLET | Freq: Every day | ORAL | Status: DC
Start: 2015-09-14 — End: 2016-01-30

## 2015-09-14 MED ORDER — SITAGLIPTIN PHOSPHATE 100 MG PO TABS
100.0000 mg | ORAL_TABLET | Freq: Every day | ORAL | Status: DC
Start: 2015-09-14 — End: 2015-12-14

## 2015-09-14 MED ORDER — SPIRONOLACTONE 25 MG PO TABS: 25.0000 mg | ORAL_TABLET | Freq: Two times a day (BID) | ORAL | Status: DC

## 2015-09-14 MED ORDER — METFORMIN HCL 1000 MG PO TABS: ORAL_TABLET | ORAL | Status: DC

## 2015-09-14 MED ORDER — METOPROLOL SUCCINATE ER 50 MG PO TB24: 50.0000 mg | ORAL_TABLET | Freq: Every day | ORAL | Status: DC

## 2015-09-14 NOTE — Telephone Encounter (Signed)
Rxs sent.  Pt notified. 

## 2015-09-14 NOTE — Telephone Encounter (Signed)
Attention: Dr. [name], pt checking on status of this message. CB # [phone] (H)

## 2015-09-21 ENCOUNTER — Other Ambulatory Visit: Payer: Self-pay | Admitting: Family Medicine

## 2015-09-23 ENCOUNTER — Other Ambulatory Visit: Payer: Self-pay | Admitting: *Deleted

## 2015-09-23 DIAGNOSIS — G4733 Obstructive sleep apnea (adult) (pediatric): Secondary | ICD-10-CM

## 2015-09-23 DIAGNOSIS — G47411 Narcolepsy with cataplexy: Secondary | ICD-10-CM

## 2015-09-23 MED ORDER — AMPHETAMINE-DEXTROAMPHET ER 30 MG PO CP24: 30.0000 mg | ORAL_CAPSULE | Freq: Every morning | ORAL | Status: DC

## 2015-09-23 NOTE — Telephone Encounter (Signed)
Unfortunately, Adderall is a CII narcotic, and cannot be faxed.  NP's are not able to do 51 day Rx's for narcotic medications, therefore request will be forwarded to MD for approval.  I called the patient back.  Got no answer.  Left message relaying this info, and also indicated Rx should be ready for pick up in 24 hours (Monday).

## 2015-09-26 ENCOUNTER — Other Ambulatory Visit: Payer: Self-pay | Admitting: Adult Health

## 2015-09-26 ENCOUNTER — Other Ambulatory Visit: Payer: Self-pay

## 2015-09-26 DIAGNOSIS — G47411 Narcolepsy with cataplexy: Secondary | ICD-10-CM

## 2015-09-26 DIAGNOSIS — G4733 Obstructive sleep apnea (adult) (pediatric): Secondary | ICD-10-CM

## 2015-09-26 MED ORDER — AMPHETAMINE-DEXTROAMPHET ER 30 MG PO CP24: 30.0000 mg | ORAL_CAPSULE | Freq: Every morning | ORAL | Status: DC

## 2015-09-26 NOTE — Telephone Encounter (Signed)
Patient is needing a medication refill on Adderall 30 MG. The best number to contact is [phone]

## 2015-09-26 NOTE — Telephone Encounter (Signed)
Clinic is unable to locate previous Rx.

## 2015-09-26 NOTE — Telephone Encounter (Signed)
It appears this was already approved by Dr Brett Fairy on 02/03.  Message sent to clinic to follow up.

## 2015-09-27 NOTE — Telephone Encounter (Signed)
RX for adderall is ready for pick up at the front desk. No answer, left a message asking that the pt call back. Please advise him that his RX is ready and the clinic hours.

## 2015-10-10 ENCOUNTER — Telehealth: Payer: Self-pay | Admitting: Neurology

## 2015-10-10 NOTE — Telephone Encounter (Signed)
Patient came in and was wondering what the status is with the Adderall 30 MG. He said that pharmacy wanted to talk to the doctor about this prescription. The pharmacy is [facility] the phone number is [phone]. The best number to contact the patient is [phone]

## 2015-10-10 NOTE — Telephone Encounter (Signed)
Spoke to pt. He states that he called [facility] about getting his RX for adderall sent, and [facility] reported to him that they had a few questions for the doctor before they would fill the medication.  I called [facility] and spoke to [facility]. He informed me that nothing is needed from the doctor's office, but the pt needed to call and verify his driver's license number before they would fill his adderall.  I called pt back and explained that he needed to call [facility] and verify his driver's license number and that [facility] does not need anything from [place]. Pt verbalized understanding.

## 2015-11-09 ENCOUNTER — Other Ambulatory Visit: Payer: Self-pay | Admitting: Family Medicine

## 2015-12-14 ENCOUNTER — Encounter: Payer: Self-pay | Admitting: Family Medicine

## 2015-12-14 ENCOUNTER — Ambulatory Visit (INDEPENDENT_AMBULATORY_CARE_PROVIDER_SITE_OTHER): Payer: Medicare Other | Admitting: Family Medicine

## 2015-12-14 ENCOUNTER — Encounter: Payer: Medicare Other | Admitting: Family Medicine

## 2015-12-14 VITALS — BP 130/70 | HR 69 | Temp 97.7°F | Resp 16 | Ht 69.0 in | Wt 217.6 lb

## 2015-12-14 DIAGNOSIS — E785 Hyperlipidemia, unspecified: Secondary | ICD-10-CM | POA: Diagnosis not present

## 2015-12-14 DIAGNOSIS — E1165 Type 2 diabetes mellitus with hyperglycemia: Secondary | ICD-10-CM

## 2015-12-14 DIAGNOSIS — L821 Other seborrheic keratosis: Secondary | ICD-10-CM

## 2015-12-14 DIAGNOSIS — IMO0001 Reserved for inherently not codable concepts without codable children: Secondary | ICD-10-CM

## 2015-12-14 LAB — COMPLETE METABOLIC PANEL WITH GFR
ALT: 40 U/L (ref 9–46)
AST: 39 U/L — ABNORMAL HIGH (ref 10–35)
Albumin: 3.7 g/dL (ref 3.6–5.1)
Alkaline Phosphatase: 74 U/L (ref 40–115)
BUN: 7 mg/dL (ref 7–25)
CO2: 22 mmol/L (ref 20–31)
Calcium: 8.8 mg/dL (ref 8.6–10.3)
Chloride: 105 mmol/L (ref 98–110)
Creat: 0.93 mg/dL (ref 0.70–1.18)
GFR, Est African American: >89 mL/min (ref >=60)
GFR, Est Non African American: 82 mL/min (ref >=60)
Glucose, Bld: 195 mg/dL — ABNORMAL HIGH (ref 65–99)
Potassium: 3.9 mmol/L (ref 3.5–5.3)
Sodium: 137 mmol/L (ref 135–146)
Total Bilirubin: 0.9 mg/dL (ref 0.2–1.2)
Total Protein: 7.7 g/dL (ref 6.1–8.1)

## 2015-12-14 LAB — LIPID PANEL
Cholesterol: 126 mg/dL (ref 125–200)
HDL: 48 mg/dL (ref >=40)
LDL Cholesterol: 60 mg/dL (ref <130)
Total CHOL/HDL Ratio: 2.6 Ratio (ref <=5.0)
Triglycerides: 91 mg/dL (ref <150)
VLDL: 18 mg/dL (ref <30)

## 2015-12-14 LAB — GLUCOSE, POCT (MANUAL RESULT ENTRY): POC Glucose: 222 mg/dl — AB (ref 70–99)

## 2015-12-14 LAB — POCT GLYCOSYLATED HEMOGLOBIN (HGB A1C): Hemoglobin A1C: 9.9

## 2015-12-14 MED ORDER — GLIPIZIDE 5 MG PO TABS: 5.0000 mg | ORAL_TABLET | Freq: Every day | ORAL | Status: DC

## 2015-12-14 NOTE — Patient Instructions (Addendum)
     IF you received an x-ray today, you will receive an invoice from [facility]. Please contact [facility] at [phone] with questions or concerns regarding your invoice.   IF you received labwork today, you will receive an invoice from [employer]. Please contact [facility] at [phone] with questions or concerns regarding your invoice.   Our billing staff will not be able to assist you with questions regarding bills from these companies.  You will be contacted with the lab results as soon as they are available. The fastest way to get your results is to activate your My Chart account. Instructions are located on the last page of this paperwork. If you have not heard from [place] regarding the results in 2 weeks, please contact this office.    INcrease the glipizide to one pill per day, continue same dose of metformin, decrease sweets and sweetened beverages in the diet and start walking/exercise to improve your diabetes. I will look into other medications that may be easier to get covered. Return in the next 3-4 weeks with home readings and can plan to remove the area on your leg at that time. It looks like a possible wart type lesion or may still be a seborrheic keratosis, but with enlarging - need to have it removed and sent to be evaluated.   Return to the clinic or go to the nearest emergency room if any of your symptoms worsen or new symptoms occur.

## 2015-12-14 NOTE — Progress Notes (Addendum)
Subjective:  By signing my name below, I, [name], attest that this documentation has been prepared under the direction and in the presence of Edward Ray, MD. Electronically Signed: [name], [name]. 12/14/2015 , 1:38 PM .  Patient was seen in Room 21 .   Patient ID: [name], male    DOB: [birth year], 70 y.o.   MRN: [id number] Chief Complaint  Patient presents with  . Follow-up  . Diabetes  . Hypertension  . would like to discuss Januvia   HPI [name] is a 70 y.o. male Here for follow up on DM and HTN.   DM Lab Results  Component Value Date   HGBA1C 9.2 09/12/2015   He was continued on same medications as last visit, and added glipizide 2.5mg  qd. He was advised cutting back on sweets and sugars in diet and increase exercise. He is on [place] 100mg  qd, metformin 1000mg  bid, and glipizide 2.5mg  qd.   Wt Readings from Last 3 Encounters:  12/14/15 217 lb 9.6 oz (98.703 kg)  09/12/15 215 lb 6.4 oz (97.705 kg)  08/01/15 217 lb (98.431 kg)   Patient states he's stopped taking januvia at the end of January as it became $300 to refill. He did not inform me that he's stopped taking this medication or ask for another medication. He's been taking glipizide 2.5mg  and metformin 1000mg  bid. He denies checking his [name] sugar very often and cannot recall his last [name] sugar reading. He denies headaches, lightheadedness, dizziness, chest pain or shortness of breath.   Lab Results  Component Value Date   CHOL 122* 06/13/2015   HDL 47 06/13/2015   LDLCALC 56 06/13/2015   TRIG 97 06/13/2015   CHOLHDL 2.6 06/13/2015   Lab Results  Component Value Date   ALT 30 06/13/2015   AST 32 06/13/2015   ALKPHOS 66 06/13/2015   BILITOT 0.8 06/13/2015    Bump - right leg He mentions the bump over his right leg has gotten bigger.   June 2016, the lesion measured 57mm.  Oct 2016, thought to be seborrhoic keratosis, discussed incision at that time.   Patient  Active Problem List   Diagnosis Date Noted  . 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 Altered mental status 03/05/2013  . Respiratory failure, post-operative ([place]) 03/02/2013  . Hepatitis B 02/24/2013  . Cancer, hepatocellular ([place]) 02/10/2013  . Liver tumor-bleeding 01/15/2013  . Epidermal cyst 06/18/2012  . OSA (obstructive sleep apnea) 03/13/2012  . Abnormal leg movement 12/12/2011  . [name] pressure elevated 09/06/2011  . Diabetes mellitus 09/06/2011  . 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 BPH (benign prostatic hyperplasia) 09/06/2011  . Diverticula of colon 09/06/2011   Past Medical History  Diagnosis Date  . Diabetes mellitus without complication ([place])   . Hypertension   . Hyperlipidemia   . Cancer [facility])     hepatocellular cancer   . Hepatitis   . Narcolepsy     per office visit note of 08/2011   . Sleep apnea    Past Surgical History  Procedure Laterality Date  . Pinched nerve in back    . Laparoscopy  03/02/2013    Procedure: LAPAROSCOPY DIAGNOSTIC;  Surgeon: [name], MD;  Location: [facility] OR[facility];  Service: General;;  . Liver ultrasound  03/02/2013    Procedure: LIVER ULTRASOUND;  Surgeon: [name], MD;  Location: [facility];  Service: General;;  . Open partial hepatectomy [83]  03/02/2013    Procedure: OPEN PARTIAL HEPATECTOMY [83];  Surgeon: [name],  MD;  Location: [facility] OR[facility];  Service: General;;  DX LAPAROSCOPY, INTRAOPERATIVE LIVER ULTRASOUND, OPEN PARTIAL HEPATECTOMY   Allergies  Allergen Reactions  . Ace Inhibitors Swelling    Angioedema - face.    Prior to Admission medications   Medication Sig Start Date End Date Taking? Authorizing Provider  alfuzosin (UROXATRAL) 10 MG 24 hr tablet Take 10 mg by mouth at bedtime.  12/19/12   Historical Provider, MD  amLODipine (NORVASC) 5 MG tablet Take 1 tablet by mouth  daily 11/09/15   Wendie Agreste, MD  amphetamine-dextroamphetamine (ADDERALL XR) 30 MG 24 hr capsule Take 1 capsule (30 mg total) by mouth every morning. 09/26/15    Larey Seat, MD  aspirin 81 MG tablet Take 81 mg by mouth every morning.     Historical Provider, MD  glipiZIDE (GLUCOTROL) 5 MG tablet Take 0.5 tablets (2.5 mg total) by mouth daily before breakfast. 09/12/15   Wendie Agreste, MD  losartan (COZAAR) 25 MG tablet Take 1 tablet by mouth  daily 09/23/15   Wendie Agreste, MD  metFORMIN (GLUCOPHAGE) 1000 MG tablet TAKE ONE TABLET BY MOUTH TWICE DAILY WITH A MEAL 09/14/15   Wendie Agreste, MD  metFORMIN (GLUCOPHAGE) 1000 MG tablet Take 1 tablet by mouth  twice a day with meals 09/23/15   Wendie Agreste, MD  metoprolol succinate (TOPROL-XL) 50 MG 24 hr tablet Take 1 tablet (50 mg total) by mouth daily. 09/14/15   Wendie Agreste, MD  pravastatin (PRAVACHOL) 20 MG tablet Take 1 tablet (20 mg total) by mouth at bedtime. 09/14/15   Wendie Agreste, MD  pravastatin (PRAVACHOL) 20 MG tablet Take 1 tablet by mouth at  bedtime 09/23/15   Wendie Agreste, MD  sitaGLIPtin (JANUVIA) 100 MG tablet Take 1 tablet (100 mg total) by mouth daily. 09/14/15   Wendie Agreste, MD  spironolactone (ALDACTONE) 25 MG tablet Take 1 tablet (25 mg total) by mouth 2 (two) times daily. 09/14/15   Wendie Agreste, MD  spironolactone (ALDACTONE) 25 MG tablet Take 1 tablet by mouth two  times daily 09/23/15   Wendie Agreste, MD   Social History   Social History  . Marital Status: Single    Spouse Name: N/A  . Number of Children: 2  . Years of Education: 15+   Occupational History  . Not on file.   Social History Main Topics  . Smoking status: Former Research scientist (life sciences)  . Smokeless tobacco: Never Used  . Alcohol Use: No  . Drug Use: No  . Sexual Activity: Yes   Other Topics Concern  . Not on file   Social History Narrative   Patient is single and lives alone- became widower when 1 child was 69 year old, divorced 2nd marriage   Has #2 grown children, not in area or involved in his life   Patient is retired.   Patient has a college education.   Patient is right-handed.    Patient drinks maybe two sodas daily.   Review of Systems  Constitutional: Negative for fatigue and unexpected weight change.  Eyes: Negative for visual disturbance.  Respiratory: Negative for cough, chest tightness and shortness of breath.   Cardiovascular: Negative for chest pain, palpitations and leg swelling.  Gastrointestinal: Negative for abdominal pain and [name] in stool.  Neurological: Negative for dizziness, light-headedness and headaches.      Objective:   Physical Exam  Constitutional: He is oriented to person, place, and time. He appears well-developed and  well-nourished.  HENT:  Head: Normocephalic and atraumatic.  Eyes: EOM are normal. Pupils are equal, round, and reactive to light.  Neck: No JVD present. Carotid bruit is not present.  Cardiovascular: Normal rate, regular rhythm and normal heart sounds.   No murmur heard. Pulmonary/Chest: Effort normal and breath sounds normal. He has no rales.  Musculoskeletal: He exhibits no edema.  Neurological: He is alert and oriented to person, place, and time.  Skin: Skin is warm and dry.  Approximately 4mm exophytic lesion on right lateral leg with multiple projection hyperpigmented without surrounding erythema  Psychiatric: He has a normal mood and affect.  Vitals reviewed.   Filed Vitals:   12/14/15 1303  BP: 130/70  Pulse: 69  Temp: 97.7 F (36.5 C)  TempSrc: Oral  Resp: 16  Height: 5\' 9"  (1.753 m)  Weight: 217 lb 9.6 oz (98.703 kg)  SpO2: 99%   Results for orders placed or performed in visit on 12/14/15  POCT glucose (manual entry)  Result Value Ref Range   POC Glucose 222 (A) 70 - 99 mg/dl  POCT glycosylated hemoglobin (Hb A1C)  Result Value Ref Range   Hemoglobin A1C 9.9        Assessment & Plan:   [name] is a 70 y.o. male Uncontrolled type 2 diabetes mellitus without complication, without long-term current use of insulin ([place]) - Plan: POCT glucose (manual entry), POCT glycosylated  hemoglobin (Hb A1C), COMPLETE METABOLIC PANEL WITH GFR, glipiZIDE (GLUCOTROL) 5 MG tablet  - Uncontrolled. Advised to increase diet and exercise, but also increase glipizide to 5 mg daily for now. Likely will need additional agent, as unable to afford Januvia. Plan to return in 3-4 weeks with home readings and can decide on medication at that time.  Hyperlipidemia - Plan: COMPLETE METABOLIC PANEL WITH GFR, Lipid panel  -Lipid panel pending. pravastatin same dose for now.  Seborrheic keratosis  -Seb keratosis versus verrucal lesion. Plan on excision at next office visit with possible path review based on recent changes/enlargement.  Meds ordered this encounter  Medications  . glipiZIDE (GLUCOTROL) 5 MG tablet    Sig: Take 1 tablet (5 mg total) by mouth daily before breakfast.    Dispense:  30 tablet    Refill:  3   Patient Instructions       IF you received an x-[name] today, you will receive an invoice from [facility]. Please contact [facility] at [phone] with questions or concerns regarding your invoice.   IF you received labwork today, you will receive an invoice from [employer]. Please contact [facility] at [phone] with questions or concerns regarding your invoice.   Our billing staff will not be able to assist you with questions regarding bills from these companies.  You will be contacted with the lab results as soon as they are available. The fastest way to get your results is to activate your My Chart account. Instructions are located on the last page of this paperwork. If you have not heard from [place] regarding the results in 2 weeks, please contact this office.    INcrease the glipizide to one pill per day, continue same dose of metformin, decrease sweets and sweetened beverages in the diet and start walking/exercise to improve your diabetes. I will look into other medications that may be easier to get covered. Return in the next  3-4 weeks with home readings and can plan to remove the area on your leg at that time. It looks like  a possible wart type lesion or may still be a seborrheic keratosis, but with enlarging - need to have it removed and sent to be evaluated.   Return to the clinic or go to the nearest emergency room if any of your symptoms worsen or new symptoms occur.     I personally performed the services described in this documentation, which was scribed in my presence. The recorded information has been reviewed and considered, and addended by me as needed.

## 2016-01-09 DIAGNOSIS — H2513 Age-related nuclear cataract, bilateral: Secondary | ICD-10-CM | POA: Diagnosis not present

## 2016-01-09 DIAGNOSIS — E119 Type 2 diabetes mellitus without complications: Secondary | ICD-10-CM | POA: Diagnosis not present

## 2016-01-09 DIAGNOSIS — H35371 Puckering of macula, right eye: Secondary | ICD-10-CM | POA: Diagnosis not present

## 2016-01-09 DIAGNOSIS — H40013 Open angle with borderline findings, low risk, bilateral: Secondary | ICD-10-CM | POA: Diagnosis not present

## 2016-01-09 LAB — HM DIABETES EYE EXAM

## 2016-01-19 ENCOUNTER — Encounter: Payer: Self-pay | Admitting: Family Medicine

## 2016-01-19 ENCOUNTER — Ambulatory Visit (INDEPENDENT_AMBULATORY_CARE_PROVIDER_SITE_OTHER): Payer: Medicare Other | Admitting: Family Medicine

## 2016-01-19 VITALS — BP 106/60 | HR 78 | Temp 98.3°F | Resp 16 | Ht 69.5 in | Wt 216.6 lb

## 2016-01-19 DIAGNOSIS — D229 Melanocytic nevi, unspecified: Secondary | ICD-10-CM

## 2016-01-19 DIAGNOSIS — B079 Viral wart, unspecified: Secondary | ICD-10-CM

## 2016-01-19 DIAGNOSIS — B078 Other viral warts: Secondary | ICD-10-CM

## 2016-01-19 DIAGNOSIS — IMO0001 Reserved for inherently not codable concepts without codable children: Secondary | ICD-10-CM

## 2016-01-19 DIAGNOSIS — E1165 Type 2 diabetes mellitus with hyperglycemia: Secondary | ICD-10-CM

## 2016-01-19 LAB — GLUCOSE, POCT (MANUAL RESULT ENTRY): POC Glucose: 212 mg/dl — AB (ref 70–99)

## 2016-01-19 MED ORDER — GLIPIZIDE 5 MG PO TABS: 5.0000 mg | ORAL_TABLET | Freq: Two times a day (BID) | ORAL | Status: DC

## 2016-01-19 NOTE — Patient Instructions (Addendum)
IF you received an x-ray today, you will receive an invoice from [facility]. Please contact [facility] at [phone] with questions or concerns regarding your invoice.   IF you received labwork today, you will receive an invoice from [employer]. Please contact [facility] at [phone] with questions or concerns regarding your invoice.   Our billing staff will not be able to assist you with questions regarding bills from these companies.  You will be contacted with the lab results as soon as they are available. The fastest way to get your results is to activate your My Chart account. Instructions are located on the last page of this paperwork. If you have not heard from [place] regarding the results in 2 weeks, please contact this office.    Increase glipizide to twice per day with meals, continue metformin twice per day, avoid sugar containing beverages and sweets in diet. Recheck in next 2 months for repeat hemoglobin A1C, and can repeat liver test at that time as well. You may want to reset your meter and bring it with you to next visit to make sure you are getting accurate readings.  If any symptoms of low blood sugars as described below - return sooner. It is less likely for this to occur with your current levels.   Return to the clinic or go to the nearest emergency room if any of your symptoms worsen or new symptoms occur.  I will send of the wart for evaluation, but it is likely benign. We will let you know the results if any concerns.   Hypoglycemia Hypoglycemia occurs when the glucose in your blood is too low. Glucose is a type of sugar that is your body's main energy source. Hormones, such as insulin and glucagon, control the level of glucose in the blood. Insulin lowers blood glucose and glucagon increases blood glucose. Having too much insulin in your blood stream, or not eating enough food containing sugar, can result in hypoglycemia.  Hypoglycemia can happen to people with or without diabetes. It can develop quickly and can be a medical emergency.  CAUSES   Missing or delaying meals.  Not eating enough carbohydrates at meals.  Taking too much diabetes medicine.  Not timing your oral diabetes medicine or insulin doses with meals, snacks, and exercise.  Nausea and vomiting.  Certain medicines.  Severe illnesses, such as hepatitis, kidney disorders, and certain eating disorders.  Increased activity or exercise without eating something extra or adjusting medicines.  Drinking too much alcohol.  A nerve disorder that affects body functions like your heart rate, blood pressure, and digestion (autonomic neuropathy).  A condition where the stomach muscles do not function properly (gastroparesis). Therefore, medicines and food may not absorb properly.  Rarely, a tumor of the pancreas can produce too much insulin. SYMPTOMS   Hunger.  Sweating (diaphoresis).  Change in body temperature.  Shakiness.  Headache.  Anxiety.  Lightheadedness.  Irritability.  Difficulty concentrating.  Dry mouth.  Tingling or numbness in the hands or feet.  Restless sleep or sleep disturbances.  Altered speech and coordination.  Change in mental status.  Seizures or prolonged convulsions.  Combativeness.  Drowsiness (lethargic).  Weakness.  Increased heart rate or palpitations.  Confusion.  Pale, gray skin color.  Blurred or double vision.  Fainting. DIAGNOSIS  A physical exam and medical history will be performed. Your caregiver may make a diagnosis based on your symptoms. Blood tests and other lab tests may be performed to  confirm a diagnosis. Once the diagnosis is made, your caregiver will see if your signs and symptoms go away once your blood glucose is raised.  TREATMENT  Usually, you can easily treat your hypoglycemia when you notice symptoms.  Check your blood glucose. If it is less than 70 mg/dl,  take one of the following:   3-4 glucose tablets.    cup juice.    cup regular soda.   1 cup skim milk.   -1 tube of glucose gel.   5-6 hard candies.   Avoid high-fat drinks or food that may delay a rise in blood glucose levels.  Do not take more than the recommended amount of sugary foods, drinks, gel, or tablets. Doing so will cause your blood glucose to go too high.   Wait 10-15 minutes and recheck your blood glucose. If it is still less than 70 mg/dl or below your target range, repeat treatment.   Eat a snack if it is more than 1 hour until your next meal.  There may be a time when your blood glucose may go so low that you are unable to treat yourself at home when you start to notice symptoms. You may need someone to help you. You may even faint or be unable to swallow. If you cannot treat yourself, someone will need to bring you to the hospital.  [place]  If you have diabetes, follow your diabetes management plan by:  Taking your medicines as directed.  Following your exercise plan.  Following your meal plan. Do not skip meals. Eat on time.  Testing your blood glucose regularly. Check your blood glucose before and after exercise. If you exercise longer or different than usual, be sure to check blood glucose more frequently.  Wearing your medical alert jewelry that says you have diabetes.  Identify the cause of your hypoglycemia. Then, develop ways to prevent the recurrence of hypoglycemia.  Do not take a hot bath or shower right after an insulin shot.  Always carry treatment with you. Glucose tablets are the easiest to carry.  If you are going to drink alcohol, drink it only with meals.  Tell friends or family members ways to keep you safe during a seizure. This may include removing hard or sharp objects from the area or turning you on your side.  Maintain a healthy weight. SEEK MEDICAL CARE IF:   You are having problems keeping your  blood glucose in your target range.  You are having frequent episodes of hypoglycemia.  You feel you might be having side effects from your medicines.  You are not sure why your blood glucose is dropping so low.  You notice a change in vision or a new problem with your vision. SEEK IMMEDIATE MEDICAL CARE IF:   Confusion develops.  A change in mental status occurs.  The inability to swallow develops.  Fainting occurs.   This information is not intended to replace advice given to you by your health care provider. Make sure you discuss any questions you have with your health care provider.   Document Released: 08/06/2005 Document Revised: 08/11/2013 Document Reviewed: 04/12/2015 Elsevier Interactive Patient Education [employer].

## 2016-01-19 NOTE — Progress Notes (Addendum)
By signing my name below, I, Mesha Guinyard, attest that this documentation has been prepared under the direction and in the presence of [name], MD.  Electronically Signed: Verlee Monte, Medical Scribe. 01/19/2016. 4:22 PM.  Subjective:    Patient ID: [name], male    DOB: May 06, 1946, 70 y.o.   MRN: [id number]  HPI Chief Complaint  Patient presents with  . Follow-up    1 month - glucose  . Nevus    per patient a wart on RIGHT LEG for removal    HPI Comments: [name] is a 70 y.o. male with a PMHx of DM who presents to the [facility] for a follow-up. See office visit April 26th. Pt had uncontrolled with a A1c of 9.9- planned for increased exercise, and diet change. Increase glipizide to 5 mg QD. He was unable to afford [place], continued on metformin 100 mg BID. Pt mentions his blood sugar levels have been ranging between 125-140, but the highest have been 155. Pt mentioned his diet is the same since last visit, but he cut out the sweets. Pt has not experienced any symptomatic lows.  Lesion on his right lower leg- see prior visit, enlarged last visit. Planned on excision today- suspected seb. keratosis vs verrucal lesion. Pt reports it's the same size since last visit and it'ss nonpainful.  Patient Active Problem List   Diagnosis Date Noted  . 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 Altered mental status 03/05/2013  . Respiratory failure, post-operative ([place]) 03/02/2013  . Hepatitis B 02/24/2013  . Cancer, hepatocellular ([place]) 02/10/2013  . Liver tumor-bleeding 01/15/2013  . Epidermal cyst 06/18/2012  . OSA (obstructive sleep apnea) 03/13/2012  . Abnormal leg movement 12/12/2011  . Blood pressure elevated 09/06/2011  . Diabetes mellitus 09/06/2011  . 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 BPH (benign prostatic hyperplasia) 09/06/2011  . Diverticula of colon 09/06/2011   Past Medical History  Diagnosis Date  . Diabetes mellitus without  complication ([place])   . Hypertension   . Hyperlipidemia   . Cancer [facility])     hepatocellular cancer   . Hepatitis   . Narcolepsy     per office visit note of 08/2011   . Sleep apnea    Past Surgical History  Procedure Laterality Date  . Pinched nerve in back    . Laparoscopy  03/02/2013    Procedure: LAPAROSCOPY DIAGNOSTIC;  Surgeon: [name], MD;  Location: [facility];  Service: General;;  . Liver ultrasound  03/02/2013    Procedure: LIVER ULTRASOUND;  Surgeon: [name], MD;  Location: [facility];  Service: General;;  . Open partial hepatectomy [83]  03/02/2013    Procedure: OPEN PARTIAL HEPATECTOMY [83];  Surgeon: [name], MD;  Location: [facility];  Service: General;;  DX LAPAROSCOPY, INTRAOPERATIVE LIVER ULTRASOUND, OPEN PARTIAL HEPATECTOMY   Allergies  Allergen Reactions  . Ace Inhibitors Swelling    Angioedema - face.    Prior to Admission medications   Medication Sig Start Date End Date Taking? Authorizing Provider  alfuzosin (UROXATRAL) 10 MG 24 hr tablet Take 10 mg by mouth at bedtime.  12/19/12  Yes Historical Provider, MD  amLODipine (NORVASC) 5 MG tablet Take 1 tablet by mouth  daily 11/09/15  Yes Wendie Agreste, MD  amphetamine-dextroamphetamine (ADDERALL XR) 30 MG 24 hr capsule Take 1 capsule (30 mg total) by mouth every morning. 09/26/15  Yes Larey Seat, MD  aspirin 81 MG tablet Take 81 mg by mouth every morning.  Yes Historical Provider, MD  glipiZIDE (GLUCOTROL) 5 MG tablet Take 1 tablet (5 mg total) by mouth daily before breakfast. 12/14/15  Yes Wendie Agreste, MD  losartan (COZAAR) 25 MG tablet Take 1 tablet by mouth  daily 09/23/15  Yes Wendie Agreste, MD  metFORMIN (GLUCOPHAGE) 1000 MG tablet TAKE ONE TABLET BY MOUTH TWICE DAILY WITH A MEAL 09/14/15  Yes Wendie Agreste, MD  pravastatin (PRAVACHOL) 20 MG tablet Take 1 tablet (20 mg total) by mouth at bedtime. 09/14/15  Yes Wendie Agreste, MD  pravastatin (PRAVACHOL) 20 MG tablet Take 1 tablet by mouth at   bedtime 09/23/15  Yes Wendie Agreste, MD  spironolactone (ALDACTONE) 25 MG tablet Take 1 tablet by mouth two  times daily 09/23/15  Yes Wendie Agreste, MD  metFORMIN (GLUCOPHAGE) 1000 MG tablet Take 1 tablet by mouth  twice a day with meals 09/23/15   Wendie Agreste, MD  metoprolol succinate (TOPROL-XL) 50 MG 24 hr tablet Take 1 tablet (50 mg total) by mouth daily. 09/14/15   Wendie Agreste, MD  spironolactone (ALDACTONE) 25 MG tablet Take 1 tablet (25 mg total) by mouth 2 (two) times daily. 09/14/15   Wendie Agreste, MD   Social History   Social History  . Marital Status: Single    Spouse Name: N/A  . Number of Children: 2  . Years of Education: 15+   Occupational History  . Not on file.   Social History Main Topics  . Smoking status: Former Research scientist (life sciences)  . Smokeless tobacco: Never Used  . Alcohol Use: No  . Drug Use: No  . Sexual Activity: Yes   Other Topics Concern  . Not on file   Social History Narrative   Patient is single and lives alone- became widower when 1 child was 67 year old, divorced 2nd marriage   Has #2 grown children, not in area or involved in his life   Patient is retired.   Patient has a college education.   Patient is right-handed.   Patient drinks maybe two sodas daily.    Review of Systems  Constitutional: Negative for diaphoresis, fatigue and unexpected weight change.  Eyes: Negative for visual disturbance.  Respiratory: Negative for cough, chest tightness and shortness of breath.   Cardiovascular: Negative for chest pain, palpitations and leg swelling.  Gastrointestinal: Negative for abdominal pain and blood in stool.  Neurological: Negative for dizziness, light-headedness and headaches.     Objective:  BP 106/60 mmHg  Pulse 78  Temp(Src) 98.3 F (36.8 C) (Oral)  Resp 16  Ht 5' 9.5" (1.765 m)  Wt 216 lb 9.6 oz (98.249 kg)  BMI 31.54 kg/m2  SpO2 97%  Physical Exam  Constitutional: He is oriented to person, place, and time. He appears  well-developed and well-nourished.  HENT:  Head: Normocephalic and atraumatic.  Eyes: EOM are normal. Pupils are equal, round, and reactive to light.  Neck: No JVD present. Carotid bruit is not present.  Cardiovascular: Normal rate, regular rhythm and normal heart sounds.   No murmur heard. Pulmonary/Chest: Effort normal and breath sounds normal. He has no rales.  Musculoskeletal: He exhibits no edema.  Neurological: He is alert and oriented to person, place, and time.  Skin: Skin is warm and dry.  Psychiatric: He has a normal mood and affect.  Vitals reviewed.  Results for orders placed or performed in visit on 01/19/16  POCT glucose (manual entry)  Result Value Ref Range   POC  Glucose 212 (A) 70 - 99 mg/dl     Assessment & Plan:   [name] is a 70 y.o. male Uncontrolled type 2 diabetes mellitus without complication, without long-term current use of insulin ([place]) - Plan: POCT glucose (manual entry), glipiZIDE (GLUCOTROL) 5 MG tablet  -  Still uncontrolled. Increase glipizide to 5mg  BID, cont metformin 1000mg  bid. Continue to work on diet and recheck in 2 months with repeat A1c.   -hypoglycemic precautions reviewed, but unlikely with current levels.   Verruca - Plan: Dermatology pathology Nevus - Plan: Dermatology pathology  -verrucal lesion vs. Inflamed seborrheic keratosis.  Removed per procedure note, sent to path for review. Wound care discussed.  Meds ordered this encounter  Medications  . glipiZIDE (GLUCOTROL) 5 MG tablet    Sig: Take 1 tablet (5 mg total) by mouth 2 (two) times daily before a meal.    Dispense:  60 tablet    Refill:  3   Patient Instructions       IF you received an x-ray today, you will receive an invoice from [facility]. Please contact [facility] at [phone] with questions or concerns regarding your invoice.   IF you received labwork today, you will receive an invoice from [facility]. Please contact [facility] at [phone] with questions or concerns regarding your invoice.   Our billing staff will not be able to assist you with questions regarding bills from these companies.  You will be contacted with the lab results as soon as they are available. The fastest way to get your results is to activate your My Chart account. Instructions are located on the last page of this paperwork. If you have not heard from [place] regarding the results in 2 weeks, please contact this office.    Increase glipizide to twice per day with meals, continue metformin twice per day, avoid sugar containing beverages and sweets in diet. Recheck in next 2 months for repeat hemoglobin A1C, and can repeat liver test at that time as well. You may want to reset your meter and bring it with you to next visit to make sure you are getting accurate readings.  If any symptoms of low blood sugars as described below - return sooner. It is less likely for this to occur with your current levels.   Return to the clinic or go to the nearest emergency room if any of your symptoms worsen or new symptoms occur.  I will send of the wart for evaluation, but it is likely benign. We will let you know the results if any concerns.   Hypoglycemia Hypoglycemia occurs when the glucose in your blood is too low. Glucose is a type of sugar that is your body's main energy source. Hormones, such as insulin and glucagon, control the level of glucose in the blood. Insulin lowers blood glucose and glucagon increases blood glucose. Having too much insulin in your blood stream, or not eating enough food containing sugar, can result in hypoglycemia. Hypoglycemia can happen to people with or without diabetes. It can develop quickly and can be a medical emergency.  CAUSES   Missing or delaying meals.  Not eating enough carbohydrates at meals.  Taking too much diabetes medicine.  Not timing your oral diabetes medicine or insulin  doses with meals, snacks, and exercise.  Nausea and vomiting.  Certain medicines.  Severe illnesses, such as hepatitis, kidney disorders, and certain eating disorders.  Increased activity or exercise without eating something extra or adjusting  medicines.  Drinking too much alcohol.  A nerve disorder that affects body functions like your heart rate, blood pressure, and digestion (autonomic neuropathy).  A condition where the stomach muscles do not function properly (gastroparesis). Therefore, medicines and food may not absorb properly.  Rarely, a tumor of the pancreas can produce too much insulin. SYMPTOMS   Hunger.  Sweating (diaphoresis).  Change in body temperature.  Shakiness.  Headache.  Anxiety.  Lightheadedness.  Irritability.  Difficulty concentrating.  Dry mouth.  Tingling or numbness in the hands or feet.  Restless sleep or sleep disturbances.  Altered speech and coordination.  Change in mental status.  Seizures or prolonged convulsions.  Combativeness.  Drowsiness (lethargic).  Weakness.  Increased heart rate or palpitations.  Confusion.  Pale, gray skin color.  Blurred or double vision.  Fainting. DIAGNOSIS  A physical exam and medical history will be performed. Your caregiver may make a diagnosis based on your symptoms. Blood tests and other lab tests may be performed to confirm a diagnosis. Once the diagnosis is made, your caregiver will see if your signs and symptoms go away once your blood glucose is raised.  TREATMENT  Usually, you can easily treat your hypoglycemia when you notice symptoms.  Check your blood glucose. If it is less than 70 mg/dl, take one of the following:   3-4 glucose tablets.    cup juice.    cup regular soda.   1 cup skim milk.   -1 tube of glucose gel.   5-6 hard candies.   Avoid high-fat drinks or food that may delay a rise in blood glucose levels.  Do not take more than the  recommended amount of sugary foods, drinks, gel, or tablets. Doing so will cause your blood glucose to go too high.   Wait 10-15 minutes and recheck your blood glucose. If it is still less than 70 mg/dl or below your target range, repeat treatment.   Eat a snack if it is more than 1 hour until your next meal.  There may be a time when your blood glucose may go so low that you are unable to treat yourself at home when you start to notice symptoms. You may need someone to help you. You may even faint or be unable to swallow. If you cannot treat yourself, someone will need to bring you to the hospital.  [place]  If you have diabetes, follow your diabetes management plan by:  Taking your medicines as directed.  Following your exercise plan.  Following your meal plan. Do not skip meals. Eat on time.  Testing your blood glucose regularly. Check your blood glucose before and after exercise. If you exercise longer or different than usual, be sure to check blood glucose more frequently.  Wearing your medical alert jewelry that says you have diabetes.  Identify the cause of your hypoglycemia. Then, develop ways to prevent the recurrence of hypoglycemia.  Do not take a hot bath or shower right after an insulin shot.  Always carry treatment with you. Glucose tablets are the easiest to carry.  If you are going to drink alcohol, drink it only with meals.  Tell friends or family members ways to keep you safe during a seizure. This may include removing hard or sharp objects from the area or turning you on your side.  Maintain a healthy weight. SEEK MEDICAL CARE IF:   You are having problems keeping your blood glucose in your target range.  You are having frequent  episodes of hypoglycemia.  You feel you might be having side effects from your medicines.  You are not sure why your blood glucose is dropping so low.  You notice a change in vision or a new problem with your  vision. SEEK IMMEDIATE MEDICAL CARE IF:   Confusion develops.  A change in mental status occurs.  The inability to swallow develops.  Fainting occurs.   This information is not intended to replace advice given to you by your health care provider. Make sure you discuss any questions you have with your health care provider.   Document Released: 08/06/2005 Document Revised: 08/11/2013 Document Reviewed: 04/12/2015 Elsevier Interactive Patient Education [employer].      I personally performed the services described in this documentation, which was scribed in my presence. The recorded information has been reviewed and considered, and addended by me as needed.   Signed,   [name], MD [facility].  01/19/2016 5:47 PM

## 2016-01-19 NOTE — Progress Notes (Signed)
Procedure:  Consent obtained - local anesthesia with 1% lido with epi. Betadine prep and shave biopsy performed - silver nitrate used for hemostasis and drsg placed.  Wound care d/w pt.

## 2016-01-30 ENCOUNTER — Encounter: Payer: Self-pay | Admitting: Adult Health

## 2016-01-30 ENCOUNTER — Ambulatory Visit (INDEPENDENT_AMBULATORY_CARE_PROVIDER_SITE_OTHER): Payer: Medicare Other | Admitting: Adult Health

## 2016-01-30 VITALS — BP 108/62 | HR 66 | Ht 69.5 in | Wt 215.5 lb

## 2016-01-30 DIAGNOSIS — G47419 Narcolepsy without cataplexy: Secondary | ICD-10-CM | POA: Diagnosis not present

## 2016-01-30 NOTE — Patient Instructions (Signed)
Ask pharmacy about cost of Adderall Immediate release? If cheaper then call and we can switch. If your symptoms worsen or you develop new symptoms please let us know.

## 2016-01-30 NOTE — Progress Notes (Signed)
PATIENT: [name] DOB: 1945/08/30  REASON FOR VISIT: follow up-narcolepsy, obstructive sleep apnea HISTORY FROM: patient  HISTORY OF PRESENT ILLNESS: Today [name] 12th 2017: [name] is a 70 year old male with a history of narcolepsy and obstructive sleep apnea. He returns today for follow-up. The patient reports that he has not been taking Adderall extended release due to the cost of the medication. Of course since he has not been on the medication his daytime sleepiness has gotten worse. He states when he was taking the medication regularly he noted great improvement in his sleepiness. In the past he's been on Xyrem but also had to stop this medication due to the cost. The patient was also diagnosed with sleep apnea but could never find a mask that was comfortable to use. The patient states that he is able to complete all ADLs independently. He is retired. He does not operate a motor vehicle. He returns today for an evaluation.  HISTORY 08/01/15: [name] is a 39 -year-old male with a history of narcolepsy and obstructive sleep apnea. He returns today for follow-up. The patient is currently only taking Adderall extended release for his narcolepsy. In the past he has been on Xyrem but due to the price he discontinued this medication. He also states today that he is very nervous about Xyrem. He has no desire to restart this medication. The patient feels that his narcolepsy is controlled well enough with Adderall. He no longer uses his CPAP. The patient's Epworth sleepiness score is 13 and fatigue severity score is 36. The patient is currently pleased with Adderall does not want to make any adjustments to his medications. He denies any new neurological symptoms. He returns today for an evaluation.  HISTORY 01/31/15 ([place]): [name] is a very pleasant 70 y.o.-year-old Right-handed male, with an underlying medical history of hypertension, hyperlipidemia, diabetes, who  presents for followup consultation of his narcolepsy as well as sleep apnea.   01-31-15  The patient is unaccompanied today. I am seeing this patient For over 10 years now and treat his hypersomnia with adderall and XYREM, He discontinued XYREM after the drug's price escalated ibuprofen have seen him last patient 06/11/2012 at which time he indicated that he no longer uses a CPAP machine.  He has sleep study in 2012 which showed moderate obstructive sleep apnea and was titrated on CPAP of 9 cm of water pressure. He had residual sleep apnea findings without pressure and she increased his pressure to 10 cm which improved his residual AHI. While he was compliant with CPAP at the time he had problems with co-pays and indicated that he could not afford CPAP. At some point he returned a CPAP machine to his home health company.  He is willing to get restarted on treatment. He has a history of narcolepsy and indicates no recent cataplectic attacks. He was tried on Provigil which did not help sleepiness. He was last on Xyrem 4.5 g and 3 g each night In November 2015 , and he continued on Adderall XR 30 mg daily. He feels more sleepy since Xyrem stopped. Based on the recent liver surgery will need to review LFT every 3 month if patient stays on XYREM. He has Insomnia now, on XR adderall, which he never had before.  I will refill medications and he does require any refills today. He indicates no new problems. He feels that his adderall only medication regimen is working "good enough ".  REVIEW OF SYSTEMS: Out of  a complete 14 system review of symptoms, the patient complains only of the following symptoms, and all other reviewed systems are negative.  Walking difficulty daytime sleepiness, hearing loss  ALLERGIES: Allergies  Allergen Reactions  . Ace Inhibitors Swelling    Angioedema - face.     HOME MEDICATIONS: Outpatient Prescriptions Prior to Visit  Medication Sig Dispense Refill  .  alfuzosin (UROXATRAL) 10 MG 24 hr tablet Take 10 mg by mouth at bedtime.     [name] Kitchen amLODipine (NORVASC) 5 MG tablet Take 1 tablet by mouth  daily 90 tablet 0  . amphetamine-dextroamphetamine (ADDERALL XR) 30 MG 24 hr capsule Take 1 capsule (30 mg total) by mouth every morning. 90 capsule 0  . aspirin 81 MG tablet Take 81 mg by mouth every morning.     [name] Kitchen glipiZIDE (GLUCOTROL) 5 MG tablet Take 1 tablet (5 mg total) by mouth 2 (two) times daily before a meal. 60 tablet 3  . losartan (COZAAR) 25 MG tablet Take 1 tablet by mouth  daily 90 tablet 1  . metFORMIN (GLUCOPHAGE) 1000 MG tablet Take 1 tablet by mouth  twice a day with meals 180 tablet 1  . metoprolol succinate (TOPROL-XL) 50 MG 24 hr tablet Take 1 tablet (50 mg total) by mouth daily. 90 tablet 1  . pravastatin (PRAVACHOL) 20 MG tablet Take 1 tablet by mouth at  bedtime 90 tablet 1  . spironolactone (ALDACTONE) 25 MG tablet Take 1 tablet by mouth two  times daily 180 tablet 1  . metFORMIN (GLUCOPHAGE) 1000 MG tablet TAKE ONE TABLET BY MOUTH TWICE DAILY WITH A MEAL 180 tablet 1  . pravastatin (PRAVACHOL) 20 MG tablet Take 1 tablet (20 mg total) by mouth at bedtime. 90 tablet 1  . spironolactone (ALDACTONE) 25 MG tablet Take 1 tablet (25 mg total) by mouth 2 (two) times daily. 180 tablet 1   No facility-administered medications prior to visit.    PAST MEDICAL HISTORY: Past Medical History  Diagnosis Date  . Diabetes mellitus without complication ([place])   . Hypertension   . Hyperlipidemia   . Cancer [facility])     hepatocellular cancer   . Hepatitis   . Narcolepsy     per office visit note of 08/2011   . Sleep apnea     PAST SURGICAL HISTORY: Past Surgical History  Procedure Laterality Date  . Pinched nerve in back    . Laparoscopy  03/02/2013    Procedure: LAPAROSCOPY DIAGNOSTIC;  Surgeon: [name], MD;  Location: [facility];  Service: General;;  . Liver ultrasound  03/02/2013    Procedure: LIVER ULTRASOUND;  Surgeon: [name], MD;   Location: [facility];  Service: General;;  . Open partial hepatectomy [83]  03/02/2013    Procedure: OPEN PARTIAL HEPATECTOMY [83];  Surgeon: [name], MD;  Location: [facility];  Service: General;;  DX LAPAROSCOPY, INTRAOPERATIVE LIVER ULTRASOUND, OPEN PARTIAL HEPATECTOMY    FAMILY HISTORY: Family History  Problem Relation Age of Onset  . Dementia Mother   . Cancer Father   . Diabetes Brother   . Hypertension Brother   . Cancer Brother     SOCIAL HISTORY: Social History   Social History  . Marital Status: Single    Spouse Name: N/A  . Number of Children: 2  . Years of Education: 15+   Occupational History  . Not on file.   Social History Main Topics  . Smoking status: Former Research scientist (life sciences)  . Smokeless tobacco: Never Used  . Alcohol  Use: No  . Drug Use: No  . Sexual Activity: Yes   Other Topics Concern  . Not on file   Social History Narrative   Patient is single and lives alone- became widower when 1 child was 35 year old, divorced 2nd marriage   Has #2 grown children, not in area or involved in his life   Patient is retired.   Patient has a college education.   Patient is right-handed.   Patient drinks maybe two sodas daily.      PHYSICAL EXAM  Filed Vitals:   01/30/16 0914  BP: 108/62  Pulse: 66  Height: 5' 9.5" (1.765 m)  Weight: 215 lb 8 oz (97.75 kg)   Body mass index is 31.38 kg/(m^2).  Generalized: Well developed, in no acute distress   Neurological examination  Mentation: Alert oriented to time, place, history taking. Follows all commands speech and language fluent Cranial nerve II-XII: Pupils were equal round reactive to light. Extraocular movements were full, visual field were full on confrontational test. Facial sensation and strength were normal. Uvula tongue midline. Head turning and shoulder shrug  were normal and symmetric. Motor: The motor testing reveals 5 over 5 strength of all 4 extremities. Good symmetric motor tone is noted throughout.    Sensory: Sensory testing is intact to soft touch on all 4 extremities. No evidence of extinction is noted.  Coordination: Cerebellar testing reveals good finger-nose-finger and heel-to-shin bilaterally.  Gait and station: Patient uses a cane when ambulating. Tandem gait not attempted. Reflexes: Deep tendon reflexes are symmetric and normal bilaterally.   DIAGNOSTIC DATA (LABS, IMAGING, TESTING) - I reviewed patient records, labs, notes, testing and imaging myself where available.      Component Value Date/Time   NA 137 12/14/2015 1337   NA 137 09/07/2015 0800   NA 137 11/11/2013 1300   K 3.9 12/14/2015 1337   K 4.7 09/07/2015 0800   CL 105 12/14/2015 1337   CO2 22 12/14/2015 1337   CO2 23 09/07/2015 0800   GLUCOSE 195* 12/14/2015 1337   GLUCOSE 221* 09/07/2015 0800   GLUCOSE 279* 11/11/2013 1300   BUN 7 12/14/2015 1337   BUN 13.5 09/07/2015 0800   BUN 10 11/11/2013 1300   CREATININE 0.93 12/14/2015 1337   CREATININE 1.2 09/07/2015 0800   CREATININE 0.95 11/11/2013 1300   CALCIUM 8.8 12/14/2015 1337   CALCIUM 9.4 09/07/2015 0800   PROT 7.7 12/14/2015 1337   PROT 7.8 03/03/2014 0754   PROT 7.7 11/11/2013 1300   ALBUMIN 3.7 12/14/2015 1337   ALBUMIN 3.7 03/03/2014 0754   ALBUMIN 4.2 11/11/2013 1300   AST 39* 12/14/2015 1337   AST 36* 03/03/2014 0754   ALT 40 12/14/2015 1337   ALT 32 03/03/2014 0754   ALKPHOS 74 12/14/2015 1337   ALKPHOS 77 03/03/2014 0754   BILITOT 0.9 12/14/2015 1337   BILITOT 0.75 03/03/2014 0754   GFRNONAA 82 12/14/2015 1337   GFRNONAA 81 11/11/2013 1300   GFRAA >89 12/14/2015 1337   GFRAA 94 11/11/2013 1300   Lab Results  Component Value Date   CHOL 126 12/14/2015   HDL 48 12/14/2015   LDLCALC 60 12/14/2015   TRIG 91 12/14/2015   CHOLHDL 2.6 12/14/2015   Lab Results  Component Value Date   HGBA1C 9.9 12/14/2015      ASSESSMENT AND PLAN 70 y.o. year old male  has a past medical history of Diabetes mellitus without complication ([place]);  Hypertension; Hyperlipidemia; Cancer ([place]); Hepatitis; Narcolepsy; and Sleep  apnea. here with:  1. Narcolepsy  The patient was doing well when he was taking Adderall extended release. He had to stop this medication due to cost. I have advised the patient that we could switch to Adderall immediate release. He states that he will check with his pharmacy on the cost and let me know if he would like to try this medication. Patient will follow-up in 6 months or sooner if needed.     Ward Givens, MSN, NP-C 01/30/2016, 9:55 AM [facility] [address] [place], [place] [address] [phone]

## 2016-01-31 NOTE — Progress Notes (Signed)
I agree with the assessment and plan as directed by NP .The patient is known to me .   Kattaleya Alia, MD  

## 2016-02-11 ENCOUNTER — Other Ambulatory Visit: Payer: Self-pay | Admitting: Urgent Care

## 2016-03-08 ENCOUNTER — Other Ambulatory Visit: Payer: Self-pay | Admitting: Family Medicine

## 2016-03-13 ENCOUNTER — Telehealth: Payer: Self-pay | Admitting: Oncology

## 2016-03-13 NOTE — Telephone Encounter (Signed)
Called patient to confirm appointment. Left voice message. Appointment letter and schedule mailed. Maria F. °

## 2016-03-26 ENCOUNTER — Ambulatory Visit: Payer: Medicare Other | Admitting: Oncology

## 2016-03-26 ENCOUNTER — Other Ambulatory Visit: Payer: Medicare Other

## 2016-03-30 ENCOUNTER — Ambulatory Visit: Payer: Medicare Other | Admitting: Oncology

## 2016-03-30 ENCOUNTER — Other Ambulatory Visit (HOSPITAL_BASED_OUTPATIENT_CLINIC_OR_DEPARTMENT_OTHER): Payer: Medicare Other

## 2016-03-30 ENCOUNTER — Ambulatory Visit (HOSPITAL_BASED_OUTPATIENT_CLINIC_OR_DEPARTMENT_OTHER): Payer: Medicare Other | Admitting: Nurse Practitioner

## 2016-03-30 ENCOUNTER — Telehealth: Payer: Self-pay | Admitting: Oncology

## 2016-03-30 ENCOUNTER — Encounter: Payer: Self-pay | Admitting: Nurse Practitioner

## 2016-03-30 VITALS — BP 133/66 | HR 74 | Temp 97.9°F | Resp 18 | Wt 216.1 lb

## 2016-03-30 DIAGNOSIS — C220 Liver cell carcinoma: Secondary | ICD-10-CM

## 2016-03-30 DIAGNOSIS — Z8505 Personal history of malignant neoplasm of liver: Secondary | ICD-10-CM

## 2016-03-30 NOTE — Progress Notes (Signed)
  [name] OFFICE PROGRESS NOTE   Diagnosis:  Hepatocellular carcinoma  INTERVAL HISTORY:   [name] returns as scheduled. He reports a good appetite. No weight loss. He denies abdominal pain. Bowels moving regularly. No nausea or vomiting.  Objective:  Vital signs in last 24 hours:  Blood pressure 133/66, pulse 74, temperature 97.9 F (36.6 C), temperature source Oral, resp. rate 18, weight 216 lb 1.6 oz (98 kg), SpO2 100 %.    HEENT: No thrush or ulcers. Lymphatics: No palpable cervical, supraclavicular, axillary or inguinal lymph nodes. Resp: Lungs clear bilaterally. Cardio: Regular rate and rhythm. GI: Abdomen soft and nontender. No hepatomegaly. No mass. No apparent ascites. Vascular: No leg edema.    Lab Results:  Lab Results  Component Value Date   WBC 9.5 03/08/2013   HGB 11.1 (L) 03/08/2013   HCT 34.3 (L) 03/08/2013   MCV 85.5 03/08/2013   PLT 215 03/08/2013   NEUTROABS 5.3 03/08/2013  AFP pending  Imaging:  No results found.  Medications: I have reviewed the patient's current medications.  Assessment/Plan: 1. Hepatocellular carcinoma, stage I (T1 NX) status post partial left hepatectomy 03/02/2013.  08/31/2013 AFP 2.4.   CT abdomen 08/31/2013 with postoperative changes of partial hepatectomy with small postoperative fluid collection along the resection margin. No definite signs to suggest residual or locally recurrent disease. Several borderline enlarged and minimally enlarged lymph nodes superior to the liver in the juxtapericardiac fat of the lower anterior mediastinum with the largest measuring 11 mm slightly increased compared to the prior study 01/15/2013. Several prominent non-pathologically enlarged upper abdominal ligament lymph nodes similar to the prior study. Small esophageal varices.   CT abdomen 03/03/2012 without evidence of recurrent hepatocellular carcinoma, stable right lung base nodule  CT the abdomen and  pelvis 09/06/2014 without evidence of recurrent hepatocellular carcinoma, slight enlargement of pre-cardiac lymph nodes  CT abdomen/pelvis 09/07/2015 with no findings for recurrent tumor or metastatic disease. Stable small 3 mm right middle lobe pulmonary nodule since 2014. Epicardial lymph node measured 11 mm, previously 13 mm. Stable cirrhotic changes. Fairly significant esophageal varices. 2. Cirrhosis. 3. Hepatitis B core antibody positive. 4. Diabetes.   Disposition: [name] remains in clinical remission from hepatocellular carcinoma. We will follow-up on the AFP from today. He will be scheduled for a surveillance CT scan in January 2018. He will return for a follow-up visit a few days later to review the results. He will contact the office in the interim with any problems.  Plan reviewed with Dr. Benay Spice.    Ned Card ANP/GNP-BC   03/30/2016  10:16 AM

## 2016-03-30 NOTE — Telephone Encounter (Signed)
Gave pt cal & avs °

## 2016-03-31 LAB — AFP TUMOR MARKER: AFP, Serum, Tumor Marker: 2.6 ng/mL (ref 0.0–8.3)

## 2016-03-31 LAB — ALPHA FETO PROTEIN (PARALLEL TESTING): AFP-Tumor Marker: 2.9 ng/mL (ref <6.1)

## 2016-04-03 ENCOUNTER — Telehealth: Payer: Self-pay | Admitting: *Deleted

## 2016-04-03 NOTE — Telephone Encounter (Signed)
-----   Message from Ladell Pier, MD sent at 04/01/2016  4:45 PM EDT ----- Please call patient, afp is normal

## 2016-04-04 NOTE — Telephone Encounter (Signed)
Left message on voicemail for pt to call office. (AFP is normal, per Dr. Benay Spice.)

## 2016-04-06 ENCOUNTER — Telehealth: Payer: Self-pay | Admitting: *Deleted

## 2016-04-06 NOTE — Telephone Encounter (Signed)
-----   Message from Ladell Pier, MD sent at 04/01/2016  4:45 PM EDT ----- Please call patient, afp is normal

## 2016-04-06 NOTE — Telephone Encounter (Signed)
Per MD,Called Pt unable to reach, lmovm afp tumor marker is normal. Request pt call office to confirm message received.

## 2016-04-19 ENCOUNTER — Ambulatory Visit (INDEPENDENT_AMBULATORY_CARE_PROVIDER_SITE_OTHER): Payer: Medicare Other | Admitting: Family Medicine

## 2016-04-19 ENCOUNTER — Encounter: Payer: Self-pay | Admitting: Family Medicine

## 2016-04-19 VITALS — BP 110/64 | HR 83 | Temp 97.7°F | Resp 18 | Ht 69.52 in | Wt 217.4 lb

## 2016-04-19 DIAGNOSIS — E119 Type 2 diabetes mellitus without complications: Secondary | ICD-10-CM | POA: Diagnosis not present

## 2016-04-19 DIAGNOSIS — Z23 Encounter for immunization: Secondary | ICD-10-CM | POA: Diagnosis not present

## 2016-04-19 DIAGNOSIS — I10 Essential (primary) hypertension: Secondary | ICD-10-CM

## 2016-04-19 LAB — POCT GLYCOSYLATED HEMOGLOBIN (HGB A1C): Hemoglobin A1C: 7.9

## 2016-04-19 LAB — GLUCOSE, POCT (MANUAL RESULT ENTRY): POC Glucose: 299 mg/dl — AB (ref 70–99)

## 2016-04-19 LAB — MICROALBUMIN, URINE: Microalb, Ur: 0.8 mg/dL

## 2016-04-19 MED ORDER — LOSARTAN POTASSIUM 25 MG PO TABS: 25.0000 mg | ORAL_TABLET | Freq: Every day | ORAL | 1 refills | Status: DC

## 2016-04-19 MED ORDER — PRAVASTATIN SODIUM 20 MG PO TABS: 20.0000 mg | ORAL_TABLET | Freq: Every day | ORAL | 1 refills | Status: DC

## 2016-04-19 MED ORDER — METFORMIN HCL 1000 MG PO TABS: 1000.0000 mg | ORAL_TABLET | Freq: Two times a day (BID) | ORAL | 1 refills | Status: DC

## 2016-04-19 MED ORDER — GLIPIZIDE 5 MG PO TABS: ORAL_TABLET | ORAL | 0 refills | Status: DC

## 2016-04-19 MED ORDER — SPIRONOLACTONE 25 MG PO TABS: 25.0000 mg | ORAL_TABLET | Freq: Two times a day (BID) | ORAL | 1 refills | Status: DC

## 2016-04-19 MED ORDER — METOPROLOL SUCCINATE ER 50 MG PO TB24: 50.0000 mg | ORAL_TABLET | Freq: Every day | ORAL | 1 refills | Status: DC

## 2016-04-19 MED ORDER — SITAGLIPTIN PHOSPHATE 100 MG PO TABS: 100.0000 mg | ORAL_TABLET | Freq: Every day | ORAL | 3 refills | Status: DC

## 2016-04-19 NOTE — Progress Notes (Signed)
By signing my name below, I, [name], attest that this documentation has been prepared under the direction and in the presence of [name].  Electronically Signed: Verlee [name], Medical Scribe. 04/19/16. 11:06 AM.  Subjective:    Patient ID: [name], male    DOB: [birth year], 70 y.o.   MRN: [id number]  HPI Chief Complaint  Patient presents with  . Follow-up    for diabetes  . Immunizations    flu vaccine    HPI Comments: [name] is a 70 y.o. male with a PMHx of HTN who presents to the [facility] for DM follow-up.  DM: Uncontrolled last visit. We increased his glipizide to 10 mg BID, and continued Metformin. Pt mentions his blood sugar has been in the 200s since stopping Januvia 100 mg in Jan this year due to cost. Pt stopped taking Januvia in April due to financial concerns, but would like to get back on it. Pt was taking Glipizide and Metformin 01/19/2016. Pt is interested in taking a diabetic pill that helps him loose weight at the same time. Pt denies experiencing hypoglycemia symptoms.  Lab Results  Component Value Date   HGBA1C 9.9 12/14/2015   Lab Results  Component Value Date   MICROALBUR 0.3 12/13/2014   Hepatocellular CA: Last visit with oncology Aug 11th, clinical remission. Planned on repeat CT of abdomen Jan 2018. AFP nl last visit. Lab Results  Component Value Date   CREATININE 0.93 12/14/2015   HTN: Takes toprol-XL 50 mg Lab Results  Component Value Date   CREATININE 0.93 12/14/2015    Patient Active Problem List   Diagnosis Date Noted  . 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 Altered mental status 03/05/2013  . Respiratory failure, post-operative ([place]) 03/02/2013  . Hepatitis B 02/24/2013  . Cancer, hepatocellular ([place]) 02/10/2013  . Liver tumor-bleeding 01/15/2013  . Epidermal cyst 06/18/2012  . OSA (obstructive sleep apnea) 03/13/2012  . Abnormal leg movement 12/12/2011  . Blood pressure  elevated 09/06/2011  . Diabetes mellitus 09/06/2011  . 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 BPH (benign prostatic hyperplasia) 09/06/2011  . Diverticula of colon 09/06/2011   Past Medical History:  Diagnosis Date  . Cancer [facility])    hepatocellular cancer   . Diabetes mellitus without complication ([place])   . Hepatitis   . Hyperlipidemia   . Hypertension   . Narcolepsy    per office visit note of 08/2011   . Sleep apnea    Past Surgical History:  Procedure Laterality Date  . LAPAROSCOPY  03/02/2013   Procedure: LAPAROSCOPY DIAGNOSTIC;  Surgeon: [name], MD;  Location: [facility];  Service: General;;  . LIVER ULTRASOUND  03/02/2013   Procedure: LIVER ULTRASOUND;  Surgeon: [name], MD;  Location: [facility];  Service: General;;  . OPEN PARTIAL HEPATECTOMY   03/02/2013   Procedure: OPEN PARTIAL HEPATECTOMY [83];  Surgeon: [name], MD;  Location: [facility];  Service: General;;  DX LAPAROSCOPY, INTRAOPERATIVE LIVER ULTRASOUND, OPEN PARTIAL HEPATECTOMY  . pinched nerve in back     Allergies  Allergen Reactions  . Ace Inhibitors Swelling    Angioedema - face.    Prior to Admission medications   Medication Sig Start Date End Date Taking? Authorizing Provider  alfuzosin (UROXATRAL) 10 MG 24 hr tablet Take 10 mg by mouth at bedtime.  12/19/12   Historical Provider, MD  amLODipine (NORVASC) 5 MG tablet Take 1 tablet by mouth  daily 11/09/15   Wendie Agreste, MD  amphetamine-dextroamphetamine (ADDERALL XR) 30 MG 24 hr capsule Take 1 capsule (30 mg total) by mouth every morning. 09/26/15   Larey Seat, MD  aspirin 81 MG tablet Take 81 mg by mouth every morning.     Historical Provider, MD  glipiZIDE (GLUCOTROL) 5 MG tablet Take 1 tablet by mouth  twice a day before meals 03/08/16   Wendie Agreste, MD  losartan (COZAAR) 25 MG tablet Take 1 tablet by mouth  daily 09/23/15   Wendie Agreste, MD  metFORMIN (GLUCOPHAGE) 1000 MG tablet Take 1 tablet by mouth  twice a day with meals 09/23/15   Wendie Agreste,  MD  metoprolol succinate (TOPROL-XL) 50 MG 24 hr tablet Take 1 tablet (50 mg total) by mouth daily. 09/14/15   Wendie Agreste, MD  pravastatin (PRAVACHOL) 20 MG tablet Take 1 tablet by mouth at  bedtime 09/23/15   Wendie Agreste, MD  spironolactone (ALDACTONE) 25 MG tablet Take 1 tablet by mouth two  times daily 09/23/15   Wendie Agreste, MD   Social History   Social History  . Marital status: Single    Spouse name: N/A  . Number of children: 2  . Years of education: 15+   Occupational History  . Not on file.   Social History Main Topics  . Smoking status: Former Research scientist (life sciences)  . Smokeless tobacco: Never Used  . Alcohol use No  . Drug use: No  . Sexual activity: Yes   Other Topics Concern  . Not on file   Social History Narrative   Patient is single and lives alone- became widower when 1 child was 70 year old, divorced 2nd marriage   Has #2 grown children, not in area or involved in his life   Patient is retired.   Patient has a college education.   Patient is right-handed.   Patient drinks maybe two sodas daily.   Depression screen [facility] 2/9 04/19/2016 01/19/2016 12/14/2015 09/12/2015 06/13/2015  Decreased Interest 0 0 0 0 0  Down, Depressed, Hopeless 0 0 0 0 0  PHQ - 2 Score 0 0 0 0 0   Review of Systems  Constitutional: Negative for fatigue and unexpected weight change.  Eyes: Negative for visual disturbance.  Respiratory: Negative for cough, chest tightness and shortness of breath.   Cardiovascular: Negative for chest pain, palpitations and leg swelling.  Gastrointestinal: Negative for abdominal pain and blood in stool.  Neurological: Negative for dizziness, light-headedness and headaches.   Objective:  Physical Exam  Constitutional: He is oriented to person, place, and time. He appears well-developed and well-nourished.  HENT:  Head: Normocephalic and atraumatic.  Eyes: EOM are normal. Pupils are equal, round, and reactive to light.  Neck: No JVD present. Carotid bruit is  not present.  Cardiovascular: Normal rate, regular rhythm and normal heart sounds.   No murmur heard. Pulmonary/Chest: Effort normal and breath sounds normal. He has no rales.  Musculoskeletal: He exhibits no edema.  Neurological: He is alert and oriented to person, place, and time.  Skin: Skin is warm and dry.  Psychiatric: He has a normal mood and affect.  Vitals reviewed.  BP 110/64   Pulse 83   Temp 97.7 F (36.5 C) (Oral)   Resp 18   Ht 5' 9.52" (1.766 m)   Wt 217 lb 6.4 oz (98.6 kg)   SpO2 97%   BMI 31.63 kg/m    Results for orders placed or performed in visit on 04/19/16  POCT glycosylated  hemoglobin (Hb A1C)  Result Value Ref Range   Hemoglobin A1C 7.9   POCT glucose (manual entry)  Result Value Ref Range   POC Glucose 299 (A) 70 - 99 mg/dl   Assessment & Plan:   [name] is a 70 y.o. male Flu vaccine need - Plan: Flu Vaccine QUAD 36+ mos IM given  Type 2 diabetes mellitus without complication, without long-term current use of insulin (HCC) - Plan: Microalbumin, urine, POCT glycosylated hemoglobin (Hb A1C), POCT glucose (manual entry)  - options discussed, but he would like to restart Januvia. Restart 100 mg daily, decrease glipizide back to the original dose that was intended of 5 mg twice a day with meals. Monitor home readings and if significantly elevated, return for recheck sooner than 3 months.. Hypoglycemic precautions given.  Essential hypertension - Plan: metoprolol succinate (TOPROL-XL) 50 MG 24 hr tablet  -Stable. Meds refilled, but advised him to double check our list with his list at home to make sure that we are on the same page with medications he is taking. If other refills needed, to let me know. If any changes in what he is taking versus what is on our list, advised him to call to clarify that.   Meds ordered this encounter  Medications  . DISCONTD: sitaGLIPtin (JANUVIA) 100 MG tablet    Sig: Take 1 tablet (100 mg total) by mouth daily.      Dispense:  30 tablet    Refill:  3  . glipiZIDE (GLUCOTROL) 5 MG tablet    Sig: Take 1 tablet by mouth  twice a day before meals    Dispense:  180 tablet    Refill:  0  . metFORMIN (GLUCOPHAGE) 1000 MG tablet    Sig: Take 1 tablet (1,000 mg total) by mouth 2 (two) times daily with a meal.    Dispense:  180 tablet    Refill:  1  . pravastatin (PRAVACHOL) 20 MG tablet    Sig: Take 1 tablet (20 mg total) by mouth at bedtime.    Dispense:  90 tablet    Refill:  1  . sitaGLIPtin (JANUVIA) 100 MG tablet    Sig: Take 1 tablet (100 mg total) by mouth daily.    Dispense:  30 tablet    Refill:  3  . spironolactone (ALDACTONE) 25 MG tablet    Sig: Take 1 tablet (25 mg total) by mouth 2 (two) times daily.    Dispense:  180 tablet    Refill:  1  . metoprolol succinate (TOPROL-XL) 50 MG 24 hr tablet    Sig: Take 1 tablet (50 mg total) by mouth daily.    Dispense:  90 tablet    Refill:  1  . losartan (COZAAR) 25 MG tablet    Sig: Take 1 tablet (25 mg total) by mouth daily.    Dispense:  90 tablet    Refill:  1   Patient Instructions    Restart Januvia 100 mg once a day, and decrease the glipizide to 1 of the 5mg  pills twice per day with meals. Check your blood sugars and if running over 200, or any symptomatic low blood sugars, return for recheck right away.  Please review the list of medications we have today with your medications at home and make sure that they match exactly what you are taking. If there are any differences, please call us so we can discuss those meds. If any other medication refills needed, let me know.  IF you received an x-[name] today, you will receive an invoice from [facility]. Please contact [facility] at [phone] with questions or concerns regarding your invoice.   IF you received labwork today, you will receive an invoice from [employer]. Please contact [facility] at [phone] with questions or concerns  regarding your invoice.   Our billing staff will not be able to assist you with questions regarding bills from these companies.  You will be contacted with the lab results as soon as they are available. The fastest way to get your results is to activate your My Chart account. Instructions are located on the last page of this paperwork. If you have not heard from [place] regarding the results in 2 weeks, please contact this office.        I personally performed the services described in this documentation, which was scribed in my presence. The recorded information has been reviewed and considered, and addended by me as needed.   Signed,   Edward Ray, MD [facility].  04/20/16 11:15 AM

## 2016-04-19 NOTE — Patient Instructions (Addendum)
  Restart Januvia 100 mg once a day, and decrease the glipizide to 1 of the 5mg  pills twice per day with meals. Check your blood sugars and if running over 200, or any symptomatic low blood sugars, return for recheck right away.  Please review the list of medications we have today with your medications at home and make sure that they match exactly what you are taking. If there are any differences, please call us so we can discuss those meds. If any other medication refills needed, let me know.  IF you received an x-ray today, you will receive an invoice from [facility]. Please contact [facility] at [phone] with questions or concerns regarding your invoice.   IF you received labwork today, you will receive an invoice from [employer]. Please contact [facility] at [phone] with questions or concerns regarding your invoice.   Our billing staff will not be able to assist you with questions regarding bills from these companies.  You will be contacted with the lab results as soon as they are available. The fastest way to get your results is to activate your My Chart account. Instructions are located on the last page of this paperwork. If you have not heard from [place] regarding the results in 2 weeks, please contact this office.

## 2016-04-20 ENCOUNTER — Encounter: Payer: Self-pay | Admitting: Family Medicine

## 2016-05-11 ENCOUNTER — Encounter: Payer: Self-pay | Admitting: *Deleted

## 2016-05-20 ENCOUNTER — Encounter (HOSPITAL_COMMUNITY): Payer: Self-pay

## 2016-05-20 ENCOUNTER — Emergency Department (HOSPITAL_COMMUNITY)
Admission: EM | Admit: 2016-05-20 | Discharge: 2016-05-20 | Disposition: A | Discharge status: Home / Self Care | Payer: Medicare Other | Attending: Emergency Medicine | Admitting: Emergency Medicine

## 2016-05-20 DIAGNOSIS — Z7982 Long term (current) use of aspirin: Secondary | ICD-10-CM | POA: Diagnosis not present

## 2016-05-20 DIAGNOSIS — S61219A Laceration without foreign body of unspecified finger without damage to nail, initial encounter: Secondary | ICD-10-CM

## 2016-05-20 DIAGNOSIS — Z79899 Other long term (current) drug therapy: Secondary | ICD-10-CM | POA: Diagnosis not present

## 2016-05-20 DIAGNOSIS — Z7984 Long term (current) use of oral hypoglycemic drugs: Secondary | ICD-10-CM | POA: Diagnosis not present

## 2016-05-20 DIAGNOSIS — W260XXA Contact with knife, initial encounter: Secondary | ICD-10-CM | POA: Insufficient documentation

## 2016-05-20 DIAGNOSIS — Y929 Unspecified place or not applicable: Secondary | ICD-10-CM | POA: Insufficient documentation

## 2016-05-20 DIAGNOSIS — Y939 Activity, unspecified: Secondary | ICD-10-CM | POA: Diagnosis not present

## 2016-05-20 DIAGNOSIS — E119 Type 2 diabetes mellitus without complications: Secondary | ICD-10-CM | POA: Insufficient documentation

## 2016-05-20 DIAGNOSIS — S61210A Laceration without foreign body of right index finger without damage to nail, initial encounter: Secondary | ICD-10-CM | POA: Diagnosis not present

## 2016-05-20 DIAGNOSIS — Y999 Unspecified external cause status: Secondary | ICD-10-CM | POA: Diagnosis not present

## 2016-05-20 DIAGNOSIS — I10 Essential (primary) hypertension: Secondary | ICD-10-CM | POA: Insufficient documentation

## 2016-05-20 DIAGNOSIS — Z87891 Personal history of nicotine dependence: Secondary | ICD-10-CM | POA: Diagnosis not present

## 2016-05-20 DIAGNOSIS — S61011A Laceration without foreign body of right thumb without damage to nail, initial encounter: Secondary | ICD-10-CM | POA: Diagnosis not present

## 2016-05-20 MED ORDER — CEPHALEXIN 500 MG PO CAPS: 500.0000 mg | ORAL_CAPSULE | Freq: Four times a day (QID) | ORAL | 0 refills | Status: DC

## 2016-05-20 MED ORDER — ACETAMINOPHEN 325 MG PO TABS
650.0000 mg | ORAL_TABLET | Freq: Once | ORAL | Status: AC
  Administered 2016-05-20: 650 mg via ORAL
  Filled 2016-05-20: qty 2

## 2016-05-20 NOTE — Discharge Instructions (Signed)
If you see signs of infection (warmth, redness, tenderness, pus, sharp increase in pain, fever, red streaking) immediately return to the emergency department. ° °Please follow with your primary care doctor in the next 2 days for a check-up. They must obtain records for further management.  ° °Do not hesitate to return to the Emergency Department for any new, worsening or concerning symptoms.  ° ° °

## 2016-05-20 NOTE — ED Provider Notes (Signed)
[facility] DEPT Provider Note   CSN: [id number] Arrival date & time: 05/20/16  0012  By signing my name below, I, Higinio Plan, attest that this documentation has been prepared under the direction and in the presence of non-physician practitioner, Monico Blitz, PA-C. Electronically Signed: Higinio Plan, Scribe. 05/20/2016. 1:11 AM.  History   Chief Complaint Chief Complaint  Patient presents with  . Laceration   The history is provided by the patient. No language interpreter was used.   HPI Comments: [name] is a 70 y.o. male who presents to the Emergency Department for an evaluation s/p a laceration to his right second digit that occurred 10 hours PTA. Pt reports he was trying to create another hole in his belt with a knife when his hand slipped causing him to cut his finger. Pt notes he has changed the bandage on his finger 3 times since the onset of his cut. He states he was initially in mild pain but denies pain now in the ED. He reports his last tetanus immunization was within the past year.   Past Medical History:  Diagnosis Date  . Cancer [facility])    hepatocellular cancer   . Diabetes mellitus without complication ([place])   . Hepatitis   . Hyperlipidemia   . Hypertension   . Narcolepsy    per office visit note of 08/2011   . Sleep apnea     Patient Active Problem List   Diagnosis Date Noted  . 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 Altered mental status 03/05/2013  . Respiratory failure, post-operative ([place]) 03/02/2013  . Hepatitis B 02/24/2013  . Cancer, hepatocellular ([place]) 02/10/2013  . Liver tumor-bleeding 01/15/2013  . Epidermal cyst 06/18/2012  . OSA (obstructive sleep apnea) 03/13/2012  . Abnormal leg movement 12/12/2011  . Blood pressure elevated 09/06/2011  . Diabetes mellitus 09/06/2011  . 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 BPH (benign prostatic hyperplasia) 09/06/2011  . Diverticula of colon 09/06/2011    Past Surgical History:  Procedure  Laterality Date  . LAPAROSCOPY  03/02/2013   Procedure: LAPAROSCOPY DIAGNOSTIC;  Surgeon: [name], MD;  [facility];  Service: General;;  . LIVER ULTRASOUND  03/02/2013   Procedure: LIVER ULTRASOUND;  Surgeon: [name], MD;  [facility];  Service: General;;  . OPEN PARTIAL HEPATECTOMY   03/02/2013   Procedure: OPEN PARTIAL HEPATECTOMY [83];  Surgeon: [name], MD;  Location: [facility];  Service: General;;  DX LAPAROSCOPY, INTRAOPERATIVE LIVER ULTRASOUND, OPEN PARTIAL HEPATECTOMY  . pinched nerve in back      Home Medications    Prior to Admission medications   Medication Sig Start Date End Date Taking? Authorizing Provider  alfuzosin (UROXATRAL) 10 MG 24 hr tablet Take 10 mg by mouth at bedtime.  12/19/12   Historical Provider, MD  amLODipine (NORVASC) 5 MG tablet Take 1 tablet by mouth  daily 11/09/15   Wendie Agreste, MD  amphetamine-dextroamphetamine (ADDERALL XR) 30 MG 24 hr capsule Take 1 capsule (30 mg total) by mouth every morning. 09/26/15   Larey Seat, MD  aspirin 81 MG tablet Take 81 mg by mouth every morning.     Historical Provider, MD  cephALEXin (KEFLEX) 500 MG capsule Take 1 capsule (500 mg total) by mouth 4 (four) times daily. 05/20/16   Othello Dickenson, PA-C  glipiZIDE (GLUCOTROL) 5 MG tablet Take 1 tablet by mouth  twice a day before meals 04/19/16   Wendie Agreste, MD  losartan (COZAAR) 25 MG tablet Take 1 tablet (25  mg total) by mouth daily. 04/19/16   Wendie Agreste, MD  metFORMIN (GLUCOPHAGE) 1000 MG tablet Take 1 tablet (1,000 mg total) by mouth 2 (two) times daily with a meal. 04/19/16   Wendie Agreste, MD  metoprolol succinate (TOPROL-XL) 50 MG 24 hr tablet Take 1 tablet (50 mg total) by mouth daily. 04/19/16   Wendie Agreste, MD  pravastatin (PRAVACHOL) 20 MG tablet Take 1 tablet (20 mg total) by mouth at bedtime. 04/19/16   Wendie Agreste, MD  sitaGLIPtin (JANUVIA) 100 MG tablet Take 1 tablet (100 mg total) by mouth daily. 04/19/16   Wendie Agreste, MD  spironolactone (ALDACTONE) 25 MG tablet Take 1 tablet (25 mg total) by mouth 2 (two) times daily. 04/19/16   Wendie Agreste, MD    Family History Family History  Problem Relation Age of Onset  . Dementia Mother   . Cancer Father   . Diabetes Brother   . Hypertension Brother   . Cancer Brother     Social History Social History  Substance Use Topics  . Smoking status: Former Research scientist (life sciences)  . Smokeless tobacco: Never Used  . Alcohol use No     Allergies   Ace inhibitors   Review of Systems Review of Systems 10 systems reviewed and all are negative for acute change except as noted in the HPI.  Physical Exam Updated Vital Signs BP 118/64 (BP Location: Right Arm)   Pulse 75   Temp 98.8 F (37.1 C) (Oral)   Resp 14   Ht 5\' 9"  (1.753 m)   Wt 217 lb (98.4 kg)   SpO2 99%   BMI 32.05 kg/m   Physical Exam  Constitutional: He is oriented to person, place, and time. He appears well-developed and well-nourished. No distress.  HENT:  Head: Normocephalic and atraumatic.  Mouth/Throat: Oropharynx is clear and moist.  Eyes: Conjunctivae and EOM are normal. Pupils are equal, round, and reactive to light.  Neck: Normal range of motion.  Cardiovascular: Normal rate, regular rhythm and intact distal pulses.   Pulmonary/Chest: Effort normal and breath sounds normal.  Abdominal: Soft. There is no tenderness.  Musculoskeletal: Normal range of motion.       Hands: 1/2 cm full-thickness laceration as diagrammed, consistent with venous oozing, no nailbed involvement.  Neurological: He is alert and oriented to person, place, and time.  Skin: He is not diaphoretic.  Psychiatric: He has a normal mood and affect.  Nursing note and vitals reviewed.    ED Treatments / Results  Labs (all labs ordered are listed, but only abnormal results are displayed) Labs Reviewed - No data to display  EKG  EKG Interpretation None       Radiology No results  found.  Procedures .[name] KitchenLaceration Repair Date/Time: 05/20/2016 4:52 AM Performed by: Monico Blitz Authorized by: Monico Blitz   Consent:    Consent obtained:  Verbal   Alternatives discussed:  No treatment Laceration details:    Length (cm):  0.5 Repair type:    Repair type:  Simple Pre-procedure details:    Preparation:  Patient was prepped and draped in usual sterile fashion Exploration:    Hemostasis achieved with:  Tourniquet   Contaminated: no   Treatment:    Area cleansed with:  Saline   Amount of cleaning:  Standard   Irrigation method:  Syringe   Visualized foreign bodies/material removed: no   Skin repair:    Repair method:  Tissue adhesive   (including critical care time)  Medications Ordered in ED Medications  acetaminophen (TYLENOL) tablet 650 mg (650 mg Oral Given 05/20/16 0218)    DIAGNOSTIC STUDIES:  Oxygen Saturation is 99% on RA, normal by my interpretation.    COORDINATION OF CARE:  12:55 AM Discussed treatment plan with pt at bedside and pt agreed to plan.  Initial Impression / Assessment and Plan / ED Course  I have reviewed the triage vital signs and the nursing notes.  Pertinent labs & imaging results that were available during my care of the patient were reviewed by me and considered in my medical decision making (see chart for details).  Clinical Course    Vitals:   05/20/16 0016 05/20/16 0219  BP: 118/64 118/67  Pulse: 75 64  Resp: 14 14  Temp: 98.8 F (37.1 C)   TempSrc: Oral   SpO2: 99% 100%  Weight: 98.4 kg   Height: 5\' 9"  (1.753 m)     Medications  acetaminophen (TYLENOL) tablet 650 mg (650 mg Oral Given 05/20/16 0218)    JOHNERIC GOLEY is 70 y.o. male presenting with Small laceration to right thumb. Patient takes a daily aspirin and it has continued to bleed. Tetanus shot is up-to-date, tourniquet is applied and Dermabond is used to achieve hemostasis.  Evaluation does not show pathology that would require  ongoing emergent intervention or inpatient treatment. Pt is hemodynamically stable and mentating appropriately. Discussed findings and plan with patient/guardian, who agrees with care plan. All questions answered. Return precautions discussed and outpatient follow up given.      Final Clinical Impressions(s) / ED Diagnoses   Final diagnoses:  Finger laceration, initial encounter    New Prescriptions Discharge Medication List as of 05/20/2016  2:07 AM    START taking these medications   Details  cephALEXin (KEFLEX) 500 MG capsule Take 1 capsule (500 mg total) by mouth 4 (four) times daily., Starting Sun 05/20/2016, Print         Elmyra Ricks Refoel Palladino, PA-C 05/20/16 [place], MD 05/20/16 [phone]

## 2016-05-20 NOTE — ED Triage Notes (Signed)
Patient c/o right finger pain related to a cut.  Patient states was trying to put another hole in his belt today with a knife and the knfe slipped cutting his finger.  Patient states that he has changed the bandage x3 since 1430 yesterday.  Patient states that he takes a daily aspirin and he is concerned with the bleeding not stopping.  Patient denies pain.

## 2016-05-20 NOTE — ED Notes (Signed)
Bed: [facility]20 Expected date:  Expected time:  Means of arrival:  Comments: 

## 2016-05-29 ENCOUNTER — Other Ambulatory Visit: Payer: Self-pay | Admitting: Family Medicine

## 2016-06-07 ENCOUNTER — Other Ambulatory Visit: Payer: Self-pay | Admitting: Family Medicine

## 2016-07-09 ENCOUNTER — Other Ambulatory Visit: Payer: Self-pay | Admitting: *Deleted

## 2016-07-09 DIAGNOSIS — G4733 Obstructive sleep apnea (adult) (pediatric): Secondary | ICD-10-CM

## 2016-07-09 DIAGNOSIS — N401 Enlarged prostate with lower urinary tract symptoms: Secondary | ICD-10-CM | POA: Diagnosis not present

## 2016-07-09 DIAGNOSIS — G47411 Narcolepsy with cataplexy: Secondary | ICD-10-CM

## 2016-07-09 DIAGNOSIS — R3912 Poor urinary stream: Secondary | ICD-10-CM | POA: Diagnosis not present

## 2016-07-09 MED ORDER — AMPHETAMINE-DEXTROAMPHET ER 30 MG PO CP24: 30.0000 mg | ORAL_CAPSULE | Freq: Every morning | ORAL | 0 refills | Status: DC

## 2016-07-09 NOTE — Telephone Encounter (Signed)
I called pt to advise him that his RX for adderall is ready for pick up at the front desk. No answer, left a message asking pt to call me back. If pt calls back, please advise him of this information and of clinic hours.

## 2016-07-26 ENCOUNTER — Other Ambulatory Visit: Payer: Self-pay | Admitting: Family Medicine

## 2016-07-26 DIAGNOSIS — I10 Essential (primary) hypertension: Secondary | ICD-10-CM

## 2016-07-30 ENCOUNTER — Encounter: Payer: Self-pay | Admitting: Adult Health

## 2016-07-30 ENCOUNTER — Ambulatory Visit (INDEPENDENT_AMBULATORY_CARE_PROVIDER_SITE_OTHER): Payer: Medicare Other | Admitting: Adult Health

## 2016-07-30 VITALS — BP 111/61 | HR 70 | Ht 69.0 in | Wt 221.4 lb

## 2016-07-30 DIAGNOSIS — G47419 Narcolepsy without cataplexy: Secondary | ICD-10-CM

## 2016-07-30 DIAGNOSIS — G4733 Obstructive sleep apnea (adult) (pediatric): Secondary | ICD-10-CM

## 2016-07-30 NOTE — Progress Notes (Signed)
I agree with the assessment and plan as directed by NP .The patient is known to me .   Aliany Fiorenza, MD  

## 2016-07-30 NOTE — Patient Instructions (Signed)
Continue Adderall XR  If your symptoms worsen or you develop new symptoms please let us know.

## 2016-07-30 NOTE — Progress Notes (Signed)
PATIENT: [name] DOB: 01/02/1946  REASON FOR VISIT: follow up- narcolepsy, obstructive sleep apnea HISTORY FROM: patient  HISTORY OF PRESENT ILLNESS: Today 07/30/2016: [name] is a 70 year old male with a history of narcolepsy and obstructive sleep apnea. He returns today for follow-up. The patient reports that he is back on Adderall extended release and is doing well. He denies any significant daytime sleepiness. He is able to complete all ADLs independently. 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 He does state recently he has noticed he is forgetful. States that he can be in a conversation and forget what he was going to say however he is able to recall it eventually. He does not notice any other behavior changes associated with his memory. He states that family nor friends and make any mention of his forgetfulness. The patient does have sleep apnea that is currently not treated because he was unable to afford the CPAP machine. He returns today for an evaluation.    June 12th 2017: [name] is a 70 year old male with a history of narcolepsy and obstructive sleep apnea. He returns today for follow-up. The patient reports that he has not been taking Adderall extended release due to the cost of the medication. Of course since he has not been on the medication his daytime sleepiness has gotten worse. He states when he was taking the medication regularly he noted great improvement in his sleepiness. In the past he's been on Xyrem but also had to stop this medication due to the cost. The patient was also diagnosed with sleep apnea but could never find a mask that was comfortable to use. The patient states that he is able to complete all ADLs independently. He is retired. He does not operate a motor vehicle. He returns today for an evaluation.  HISTORY 08/01/15: [name] is a 56 -year-old male with a history of narcolepsy  and obstructive sleep apnea. He returns today for follow-up. The patient is currently only taking Adderall extended release for his narcolepsy. In the past he has been on Xyrem but due to the price he discontinued this medication. He also states today that he is very nervous about Xyrem. He has no desire to restart this medication. The patient feels that his narcolepsy is controlled well enough with Adderall. He no longer uses his CPAP. The patient's Epworth sleepiness score is 13 and fatigue severity score is 36. The patient is currently pleased with Adderall does not want to make any adjustments to his medications. He denies any new neurological symptoms. He returns today for an evaluation.  HISTORY 01/31/15 (Dohmeier): [name] is a very pleasant 71 y.o.-year-old Right-handed male, with an underlying medical history of hypertension, hyperlipidemia, diabetes, who presents for followup consultation of his narcolepsy as well as sleep apnea.   01-31-15  The patient is unaccompanied today. I am seeing this patient For over 10 years now and treat his hypersomnia with adderall and XYREM, He discontinued XYREM after the drug's price escalated ibuprofen have seen him last patient 06/11/2012 at which time he indicated that he no longer uses a CPAP machine.  He has sleep study in 2012 which showed moderate obstructive sleep apnea and was titrated on CPAP of 9 cm of water pressure. He had residual sleep apnea findings without pressure and she increased his pressure to 10 cm which improved his residual AHI. While he was compliant with CPAP at the time he had  problems with co-pays and indicated that he could not afford CPAP. At some point he returned a CPAP machine to his home health company.  He is willing to get restarted on treatment. He has a history of narcolepsy and indicates no recent cataplectic attacks. He was tried on Provigil which did not help sleepiness. He was last on Xyrem 4.5 g and 3  g each night In November 2015 , and he continued on Adderall XR 30 mg daily. He feels more sleepy since Xyrem stopped. Based on the recent liver surgery will need to review LFT every 3 month if patient stays on XYREM. He has Insomnia now, on XR adderall, which he never had before.  I will refill medications and he does require any refills today. He indicates no new problems. He feels that his adderall only medication regimen is working "good enough ".   REVIEW OF SYSTEMS: Out of a complete 14 system review of symptoms, the patient complains only of the following symptoms, and all other reviewed systems are negative.  Fatigue, hearing loss  ALLERGIES: Allergies  Allergen Reactions  . Ace Inhibitors Swelling    Angioedema - face.     HOME MEDICATIONS: Outpatient Medications Prior to Visit  Medication Sig Dispense Refill  . alfuzosin (UROXATRAL) 10 MG 24 hr tablet Take 10 mg by mouth at bedtime.     [name] Kitchen amLODipine (NORVASC) 5 MG tablet Take 1 tablet by mouth  daily 90 tablet 0  . amphetamine-dextroamphetamine (ADDERALL XR) 30 MG 24 hr capsule Take 1 capsule (30 mg total) by mouth every morning. 90 capsule 0  . aspirin 81 MG tablet Take 81 mg by mouth every morning.     . cephALEXin (KEFLEX) 500 MG capsule Take 1 capsule (500 mg total) by mouth 4 (four) times daily. 20 capsule 0  . glipiZIDE (GLUCOTROL) 5 MG tablet TAKE 1 TABLET BY MOUTH  TWICE A DAY BEFORE MEALS 180 tablet 0  . JANUVIA 100 MG tablet TAKE 1 TABLET BY MOUTH  DAILY 90 tablet 0  . losartan (COZAAR) 25 MG tablet Take 1 tablet (25 mg total) by mouth daily. 90 tablet 1  . metFORMIN (GLUCOPHAGE) 1000 MG tablet Take 1 tablet (1,000 mg total) by mouth 2 (two) times daily with a meal. 180 tablet 1  . metoprolol succinate (TOPROL-XL) 50 MG 24 hr tablet Take 1 tablet (50 mg total) by mouth daily. 90 tablet 1  . pravastatin (PRAVACHOL) 20 MG tablet Take 1 tablet (20 mg total) by mouth at bedtime. 90 tablet 1  . spironolactone  (ALDACTONE) 25 MG tablet Take 1 tablet (25 mg total) by mouth 2 (two) times daily. 180 tablet 1   No facility-administered medications prior to visit.     PAST MEDICAL HISTORY: Past Medical History:  Diagnosis Date  . Cancer [facility])    hepatocellular cancer   . Diabetes mellitus without complication ([place])   . Hepatitis   . Hyperlipidemia   . Hypertension   . Narcolepsy    per office visit note of 08/2011   . Sleep apnea     PAST SURGICAL HISTORY: Past Surgical History:  Procedure Laterality Date  . LAPAROSCOPY  03/02/2013   Procedure: LAPAROSCOPY DIAGNOSTIC;  Surgeon: [name], MD;  Location: [facility];  Service: General;;  . LIVER ULTRASOUND  03/02/2013   Procedure: LIVER ULTRASOUND;  Surgeon: [name], MD;  Location: [facility];  Service: General;;  . OPEN PARTIAL HEPATECTOMY   03/02/2013   Procedure: OPEN PARTIAL HEPATECTOMY [83];  Surgeon: [name], MD;  Location: [facility];  Service: General;;  DX LAPAROSCOPY, INTRAOPERATIVE LIVER ULTRASOUND, OPEN PARTIAL HEPATECTOMY  . pinched nerve in back      FAMILY HISTORY: Family History  Problem Relation Age of Onset  . Dementia Mother   . Cancer Father   . Diabetes Brother   . Hypertension Brother   . Cancer Brother     SOCIAL HISTORY: Social History   Social History  . Marital status: Single    Spouse name: N/A  . Number of children: 2  . Years of education: 15+   Occupational History  . Not on file.   Social History Main Topics  . Smoking status: Former Research scientist (life sciences)  . Smokeless tobacco: Never Used  . Alcohol use No  . Drug use: No  . Sexual activity: Yes   Other Topics Concern  . Not on file   Social History Narrative   Patient is single and lives alone- became widower when 1 child was 15 year old, divorced 2nd marriage   Has #2 grown children, not in area or involved in his life   Patient is retired.   Patient has a college education.   Patient is right-handed.   Patient drinks maybe two sodas daily.       PHYSICAL EXAM  Vitals:   07/30/16 0754  BP: 111/61  Pulse: 70  Weight: 221 lb 6.4 oz (100.4 kg)  Height: 5\' 9"  (1.753 m)   Body mass index is 32.7 kg/m.  Generalized: Well developed, in no acute distress   Neurological examination  Mentation: Alert oriented to time, place, history taking. Follows all commands speech and language fluent Cranial nerve II-XII: Pupils were equal round reactive to light. Extraocular movements were full, visual field were full on confrontational test. Facial sensation and strength were normal. Uvula tongue midline. Head turning and shoulder shrug  were normal and symmetric. Motor: The motor testing reveals 5 over 5 strength of all 4 extremities. Good symmetric motor tone is noted throughout.  Sensory: Sensory testing is intact to soft touch on all 4 extremities. No evidence of extinction is noted.  Coordination: Cerebellar testing reveals good finger-nose-finger and heel-to-shin bilaterally.  Gait and station: Gait is normal.  Reflexes: Deep tendon reflexes are symmetric and normal bilaterally.   DIAGNOSTIC DATA (LABS, IMAGING, TESTING) - I reviewed patient records, labs, notes, testing and imaging myself where available.  Lab Results  Component Value Date   WBC 9.5 03/08/2013   HGB 11.1 (L) 03/08/2013   HCT 34.3 (L) 03/08/2013   MCV 85.5 03/08/2013   PLT 215 03/08/2013      Component Value Date/Time   NA 137 12/14/2015 1337   NA 137 09/07/2015 0800   K 3.9 12/14/2015 1337   K 4.7 09/07/2015 0800   CL 105 12/14/2015 1337   CO2 22 12/14/2015 1337   CO2 23 09/07/2015 0800   GLUCOSE 195 (H) 12/14/2015 1337   GLUCOSE 221 (H) 09/07/2015 0800   BUN 7 12/14/2015 1337   BUN 13.5 09/07/2015 0800   CREATININE 0.93 12/14/2015 1337   CREATININE 1.2 09/07/2015 0800   CALCIUM 8.8 12/14/2015 1337   CALCIUM 9.4 09/07/2015 0800   PROT 7.7 12/14/2015 1337   PROT 7.8 03/03/2014 0754   ALBUMIN 3.7 12/14/2015 1337   ALBUMIN 3.7 03/03/2014 0754    AST 39 (H) 12/14/2015 1337   AST 36 (H) 03/03/2014 0754   ALT 40 12/14/2015 1337   ALT 32 03/03/2014 0754   ALKPHOS  74 12/14/2015 1337   ALKPHOS 77 03/03/2014 0754   BILITOT 0.9 12/14/2015 1337   BILITOT 0.75 03/03/2014 0754   GFRNONAA 82 12/14/2015 1337   GFRAA >89 12/14/2015 1337   Lab Results  Component Value Date   CHOL 126 12/14/2015   HDL 48 12/14/2015   LDLCALC 60 12/14/2015   TRIG 91 12/14/2015   CHOLHDL 2.6 12/14/2015   Lab Results  Component Value Date   HGBA1C 7.9 04/19/2016      ASSESSMENT AND PLAN 69 y.o. year old male  has a past medical history of Cancer ([place]); Diabetes mellitus without complication ([place]); Hepatitis; Hyperlipidemia; Hypertension; Narcolepsy; and Sleep apnea. here with:  1. Narcolepsy 2. Sleep apnea  The patient will continue on Adderall extended release 30 mg daily. The patient's blood pressure and heart rate are in normal range. The patient sleep apnea is currently not treated. The patient was unable to afford the machine and could not find a mask that was tolerable. This could in fact be contributing to his forgetfulness. Patient advised that he should continue to monitor, if he continues to notice changes with his memory he should let us know. He will follow-up in 6 months with Dr. Mechele Claude, MSN, NP-C 07/30/2016, 7:57 AM [facility] [address] [place], [place] [address] [phone]

## 2016-09-03 ENCOUNTER — Other Ambulatory Visit (HOSPITAL_BASED_OUTPATIENT_CLINIC_OR_DEPARTMENT_OTHER): Payer: Medicare Other

## 2016-09-03 ENCOUNTER — Encounter (HOSPITAL_COMMUNITY): Payer: Self-pay

## 2016-09-03 ENCOUNTER — Ambulatory Visit (HOSPITAL_COMMUNITY)
Admission: RE | Admit: 2016-09-03 | Discharge: 2016-09-03 | Disposition: A | Discharge status: Home / Self Care | Payer: Medicare Other | Source: Ambulatory Visit | Attending: Nurse Practitioner | Admitting: Nurse Practitioner

## 2016-09-03 DIAGNOSIS — I700 Atherosclerosis of aorta: Secondary | ICD-10-CM | POA: Diagnosis not present

## 2016-09-03 DIAGNOSIS — K7460 Unspecified cirrhosis of liver: Secondary | ICD-10-CM | POA: Insufficient documentation

## 2016-09-03 DIAGNOSIS — C229 Malignant neoplasm of liver, not specified as primary or secondary: Secondary | ICD-10-CM | POA: Diagnosis not present

## 2016-09-03 DIAGNOSIS — C220 Liver cell carcinoma: Secondary | ICD-10-CM | POA: Insufficient documentation

## 2016-09-03 DIAGNOSIS — K8020 Calculus of gallbladder without cholecystitis without obstruction: Secondary | ICD-10-CM | POA: Diagnosis not present

## 2016-09-03 LAB — BASIC METABOLIC PANEL
Anion Gap: 12 mEq/L — ABNORMAL HIGH (ref 3–11)
BUN: 13.2 mg/dL (ref 7.0–26.0)
CO2: 19 mEq/L — ABNORMAL LOW (ref 22–29)
Calcium: 9.4 mg/dL (ref 8.4–10.4)
Chloride: 107 mEq/L (ref 98–109)
Creatinine: 1.1 mg/dL (ref 0.7–1.3)
EGFR: 76 mL/min/{1.73_m2} — ABNORMAL LOW (ref >90)
Glucose: 174 mg/dl — ABNORMAL HIGH (ref 70–140)
Potassium: 4.4 mEq/L (ref 3.5–5.1)
Sodium: 138 mEq/L (ref 136–145)

## 2016-09-03 MED ORDER — IOPAMIDOL (ISOVUE-300) INJECTION 61%
INTRAVENOUS | Status: AC
  Administered 2016-09-03: 100 mL
  Filled 2016-09-03: qty 100

## 2016-09-04 LAB — AFP TUMOR MARKER: AFP, Serum, Tumor Marker: 3.3 ng/mL (ref 0.0–8.3)

## 2016-09-06 ENCOUNTER — Ambulatory Visit: Payer: Medicare Other | Admitting: Oncology

## 2016-09-06 ENCOUNTER — Telehealth: Payer: Self-pay | Admitting: *Deleted

## 2016-09-06 NOTE — Telephone Encounter (Signed)
Left message on voicemail for pt to call office. Per Dr. Benay Spice: CT looks good.  Message to schedulers for office visit in one month.

## 2016-09-08 ENCOUNTER — Telehealth: Payer: Self-pay

## 2016-09-08 NOTE — Telephone Encounter (Signed)
Called and left a message with new dr Benay Spice appt  [facility]

## 2016-09-13 ENCOUNTER — Other Ambulatory Visit: Payer: Self-pay | Admitting: Family Medicine

## 2016-09-13 DIAGNOSIS — I10 Essential (primary) hypertension: Secondary | ICD-10-CM

## 2016-09-13 NOTE — Telephone Encounter (Signed)
Please call the patient to schedule an appt with Dr Carlota Raspberry

## 2016-09-18 ENCOUNTER — Encounter (HOSPITAL_COMMUNITY): Payer: Self-pay | Admitting: Emergency Medicine

## 2016-09-18 NOTE — ED Triage Notes (Signed)
Per [facility], pt fell at his facillity, with no new injuries, just unable to get himself off the floor. Denies any issues, just wants to be placed somewhere.

## 2016-09-19 ENCOUNTER — Other Ambulatory Visit: Payer: Self-pay | Admitting: Family Medicine

## 2016-09-19 NOTE — Telephone Encounter (Signed)
Please schedule an appt for the patient with Dr Carlota Raspberry within the next 3 months

## 2016-10-01 ENCOUNTER — Telehealth: Payer: Self-pay | Admitting: Oncology

## 2016-10-01 ENCOUNTER — Ambulatory Visit (HOSPITAL_BASED_OUTPATIENT_CLINIC_OR_DEPARTMENT_OTHER): Payer: Medicare Other

## 2016-10-01 ENCOUNTER — Telehealth: Payer: Self-pay | Admitting: *Deleted

## 2016-10-01 ENCOUNTER — Ambulatory Visit (HOSPITAL_BASED_OUTPATIENT_CLINIC_OR_DEPARTMENT_OTHER): Payer: Medicare Other | Admitting: Oncology

## 2016-10-01 VITALS — BP 139/74 | HR 102 | Temp 98.4°F | Resp 17 | Ht 69.0 in | Wt 218.3 lb

## 2016-10-01 DIAGNOSIS — D509 Iron deficiency anemia, unspecified: Secondary | ICD-10-CM | POA: Diagnosis not present

## 2016-10-01 DIAGNOSIS — Z8505 Personal history of malignant neoplasm of liver: Secondary | ICD-10-CM

## 2016-10-01 DIAGNOSIS — C220 Liver cell carcinoma: Secondary | ICD-10-CM

## 2016-10-01 LAB — CBC WITH DIFFERENTIAL/PLATELET
BASO%: 0.7 % (ref 0.0–2.0)
Basophils Absolute: 0 10*3/uL (ref 0.0–0.1)
EOS%: 1.1 % (ref 0.0–7.0)
Eosinophils Absolute: 0 10*3/uL (ref 0.0–0.5)
HCT: 20.2 % — ABNORMAL LOW (ref 38.4–49.9)
HGB: 6.1 g/dL — CL (ref 13.0–17.1)
LYMPH%: 29 % (ref 14.0–49.0)
MCH: 21.7 pg — ABNORMAL LOW (ref 27.2–33.4)
MCHC: 30.2 g/dL — ABNORMAL LOW (ref 32.0–36.0)
MCV: 71.8 fL — ABNORMAL LOW (ref 79.3–98.0)
MONO#: 0.3 10*3/uL (ref 0.1–0.9)
MONO%: 9.3 % (ref 0.0–14.0)
NEUT#: 1.9 10*3/uL (ref 1.5–6.5)
NEUT%: 59.9 % (ref 39.0–75.0)
Platelets: 143 10*3/uL (ref 140–400)
RBC: 2.81 10*6/uL — ABNORMAL LOW (ref 4.20–5.82)
RDW: 18.4 % — ABNORMAL HIGH (ref 11.0–14.6)
WBC: 3.1 10*3/uL — ABNORMAL LOW (ref 4.0–10.3)
lymph#: 0.9 10*3/uL (ref 0.9–3.3)

## 2016-10-01 MED ORDER — FERROUS SULFATE 325 (65 FE) MG PO TBEC: 325.0000 mg | DELAYED_RELEASE_TABLET | Freq: Three times a day (TID) | ORAL | 0 refills | Status: DC

## 2016-10-01 NOTE — Telephone Encounter (Signed)
Call placed to patient to inform him that HGB is 6.1 and that Dr. Benay Spice would like for him to start Ferrous Sulfate 325 mg PO TID which is OTC and to go to the ER if he experiences any SOB or chest pain.  Patient informed that Dr. Benay Spice has spoken with Dr. Watt Climes and that Dr. Perley Jain office will be contacting him tomorrow regarding an appt with Dr. Watt Climes.  Patient verbalized an understanding of all information.  Teach back done.  Patient appreciative of information and has no questions at this time.

## 2016-10-01 NOTE — Progress Notes (Signed)
  [facility] OFFICE PROGRESS NOTE   Diagnosis: Hepatocellular carcinoma  INTERVAL HISTORY:   Mr. Tomita returns as scheduled. He reports malaise at times. He enjoys eating ice. No bleeding.  A CT the abdomen and pelvis on 09/03/2016 revealed stable postsurgical changes from a partial hepatectomy. There are findings of cirrhosis. There are esophageal and gastric varices.   Objective:  Vital signs in last 24 hours:  Blood pressure 139/74, pulse (!) 102, temperature 98.4 F (36.9 C), temperature source Oral, resp. rate 17, height 5\' 9"  (1.753 m), weight 218 lb 4.8 oz (99 kg), SpO2 100 %.    HEENT: The conjunctivae are pale Lymphatics: No cervical, supraclavicular, axillary, or inguinal nodes Resp: Lungs clear bilaterally, no respiratory distress Cardio: Regular rate and rhythm GI: No hepatosplenomegaly, nontender, no mass Vascular: No leg edema      Lab Results:  Lab Results  Component Value Date   WBC 3.1 (L) 10/01/2016   HGB 6.1 (LL) 10/01/2016   HCT 20.2 (L) 10/01/2016   MCV 71.8 (L) 10/01/2016   PLT 143 10/01/2016   NEUTROABS 1.9 10/01/2016     Medications: I have reviewed the patient's current medications.  Assessment/Plan: 1. Hepatocellular carcinoma, stage I (T1 NX) status post partial left hepatectomy 03/02/2013.  08/31/2013 AFP 2.4.   CT abdomen 08/31/2013 with postoperative changes of partial hepatectomy with small postoperative fluid collection along the resection margin. No definite signs to suggest residual or locally recurrent disease. Several borderline enlarged and minimally enlarged lymph nodes superior to the liver in the juxtapericardiac fat of the lower anterior mediastinum with the largest measuring 11 mm slightly increased compared to the prior study 01/15/2013. Several prominent non-pathologically enlarged upper abdominal ligament lymph nodes similar to the prior study. Small esophageal varices.   CT abdomen 03/03/2012  without evidence of recurrent hepatocellular carcinoma, stable right lung base nodule  CT the abdomen and pelvis 09/06/2014 without evidence of recurrent hepatocellular carcinoma, slight enlargement of pre-cardiac lymph nodes  CT abdomen/pelvis 09/07/2015 with no findings for recurrent tumor or metastatic disease. Stable small 3 mm right middle lobe pulmonary nodule since 2014. Epicardial lymph node measured 11 mm, previously 13 mm. Stable cirrhotic changes. Fairly significant esophageal varices.  CT 09/03/2016-negative for recurrent hepatocellular carcinoma, changes of cirrhosis with varices 2. Cirrhosis. 3. Hepatitis B core antibody positive. 4. Diabetes. 5. Severe microcytic anemia-likely iron deficiency anemia   Disposition:  Mr. Borello remains in clinical remission from hepatocellular carcinoma. He will return for an office visit and AFP in 8 months. He has severe microcytic anemia today. He most likely has bare bleeding from varices causing iron deficiency. He does not appear acutely symptomatic from anemia. We received the CBC report after he left the office today. We will contact him and recommend he begin ferrous sulfate.  I contacted the gastroenterology service and they will call him for an appointment tomorrow. He will return as scheduled in 8 months. I am available to see him in the interim as needed.  Betsy Coder, MD  10/01/2016  4:46 PM

## 2016-10-01 NOTE — Telephone Encounter (Signed)
Appointments scheduled per 10/01/16 los. Patient was given a copy of the AVS report and appointment schedule per 10/01/16 los. °

## 2016-10-04 DIAGNOSIS — D500 Iron deficiency anemia secondary to blood loss (chronic): Secondary | ICD-10-CM | POA: Diagnosis not present

## 2016-10-05 DIAGNOSIS — D509 Iron deficiency anemia, unspecified: Secondary | ICD-10-CM | POA: Diagnosis not present

## 2016-10-05 DIAGNOSIS — K2900 Acute gastritis without bleeding: Secondary | ICD-10-CM | POA: Diagnosis not present

## 2016-10-05 DIAGNOSIS — K3189 Other diseases of stomach and duodenum: Secondary | ICD-10-CM | POA: Diagnosis not present

## 2016-10-19 ENCOUNTER — Telehealth: Payer: Self-pay | Admitting: *Deleted

## 2016-10-19 DIAGNOSIS — D500 Iron deficiency anemia secondary to blood loss (chronic): Secondary | ICD-10-CM | POA: Diagnosis not present

## 2016-10-19 NOTE — Telephone Encounter (Signed)
LM for patient to remind him to take iron tablets TID as prescribed. Asked for return call to schedule lab appt for CBC with GI or here per Dr. Benay Spice.

## 2016-10-29 ENCOUNTER — Other Ambulatory Visit: Payer: Self-pay | Admitting: *Deleted

## 2016-10-29 DIAGNOSIS — D509 Iron deficiency anemia, unspecified: Secondary | ICD-10-CM

## 2016-10-31 ENCOUNTER — Telehealth: Payer: Self-pay | Admitting: Oncology

## 2016-10-31 NOTE — Telephone Encounter (Signed)
lvm to inform pt of 3 month follow up appt 6/14 at 1115 am per LOS

## 2016-11-12 ENCOUNTER — Emergency Department (HOSPITAL_COMMUNITY)
Admission: EM | Admit: 2016-11-12 | Discharge: 2016-11-12 | Disposition: A | Discharge status: Home / Self Care | Payer: Medicare Other | Attending: Emergency Medicine | Admitting: Emergency Medicine

## 2016-11-12 ENCOUNTER — Encounter (HOSPITAL_COMMUNITY): Payer: Self-pay

## 2016-11-12 ENCOUNTER — Emergency Department (HOSPITAL_COMMUNITY): Payer: Medicare Other

## 2016-11-12 DIAGNOSIS — S199XXA Unspecified injury of neck, initial encounter: Secondary | ICD-10-CM | POA: Diagnosis not present

## 2016-11-12 DIAGNOSIS — Y92410 Unspecified street and highway as the place of occurrence of the external cause: Secondary | ICD-10-CM | POA: Insufficient documentation

## 2016-11-12 DIAGNOSIS — E119 Type 2 diabetes mellitus without complications: Secondary | ICD-10-CM | POA: Insufficient documentation

## 2016-11-12 DIAGNOSIS — S0121XA Laceration without foreign body of nose, initial encounter: Secondary | ICD-10-CM | POA: Diagnosis not present

## 2016-11-12 DIAGNOSIS — Z7982 Long term (current) use of aspirin: Secondary | ICD-10-CM | POA: Diagnosis not present

## 2016-11-12 DIAGNOSIS — Z87891 Personal history of nicotine dependence: Secondary | ICD-10-CM | POA: Insufficient documentation

## 2016-11-12 DIAGNOSIS — H1132 Conjunctival hemorrhage, left eye: Secondary | ICD-10-CM | POA: Diagnosis not present

## 2016-11-12 DIAGNOSIS — Z8505 Personal history of malignant neoplasm of liver: Secondary | ICD-10-CM | POA: Diagnosis not present

## 2016-11-12 DIAGNOSIS — S022XXA Fracture of nasal bones, initial encounter for closed fracture: Secondary | ICD-10-CM | POA: Insufficient documentation

## 2016-11-12 DIAGNOSIS — I10 Essential (primary) hypertension: Secondary | ICD-10-CM | POA: Insufficient documentation

## 2016-11-12 DIAGNOSIS — S0181XA Laceration without foreign body of other part of head, initial encounter: Secondary | ICD-10-CM | POA: Diagnosis not present

## 2016-11-12 DIAGNOSIS — W101XXA Fall (on)(from) sidewalk curb, initial encounter: Secondary | ICD-10-CM | POA: Insufficient documentation

## 2016-11-12 DIAGNOSIS — S0990XA Unspecified injury of head, initial encounter: Secondary | ICD-10-CM | POA: Diagnosis not present

## 2016-11-12 DIAGNOSIS — S80211A Abrasion, right knee, initial encounter: Secondary | ICD-10-CM | POA: Diagnosis not present

## 2016-11-12 DIAGNOSIS — Y9301 Activity, walking, marching and hiking: Secondary | ICD-10-CM | POA: Insufficient documentation

## 2016-11-12 DIAGNOSIS — Z79899 Other long term (current) drug therapy: Secondary | ICD-10-CM | POA: Insufficient documentation

## 2016-11-12 DIAGNOSIS — Y999 Unspecified external cause status: Secondary | ICD-10-CM | POA: Insufficient documentation

## 2016-11-12 DIAGNOSIS — S01112A Laceration without foreign body of left eyelid and periocular area, initial encounter: Secondary | ICD-10-CM | POA: Diagnosis not present

## 2016-11-12 DIAGNOSIS — Z7984 Long term (current) use of oral hypoglycemic drugs: Secondary | ICD-10-CM | POA: Insufficient documentation

## 2016-11-12 DIAGNOSIS — W19XXXA Unspecified fall, initial encounter: Secondary | ICD-10-CM

## 2016-11-12 MED ORDER — OXYCODONE HCL 5 MG PO TABS: 5.0000 mg | ORAL_TABLET | Freq: Four times a day (QID) | ORAL | 0 refills | Status: DC | PRN

## 2016-11-12 MED ORDER — LIDOCAINE HCL (PF) 1 % IJ SOLN
2.0000 mL | Freq: Once | INTRAMUSCULAR | Status: AC
  Administered 2016-11-12: 2 mL
  Filled 2016-11-12: qty 5

## 2016-11-12 MED ORDER — OXYCODONE-ACETAMINOPHEN 5-325 MG PO TABS
1.0000 | ORAL_TABLET | Freq: Once | ORAL | Status: AC
  Administered 2016-11-12: 1 via ORAL
  Filled 2016-11-12: qty 1

## 2016-11-12 MED ORDER — LIDOCAINE-EPINEPHRINE-TETRACAINE (LET) SOLUTION
3.0000 mL | Freq: Once | NASAL | Status: AC
  Administered 2016-11-12: 3 mL via TOPICAL
  Filled 2016-11-12: qty 3

## 2016-11-12 NOTE — ED Triage Notes (Signed)
Pt here for mechanical fall, he reports he tripped over a curb outside and his head on a big round light that was in the ground. He did not lose consciousness but has laceration to left left eyebrow and abrasion to bridge of nose. A&Ox4.

## 2016-11-12 NOTE — Discharge Instructions (Signed)
[place], placing I read it will not need to be removed as they're under the surface.  There is one suture in the bridge of your nose that will need to be removed in 5 days. He will needs to make an appointment with your ophthalmologist or check of your eye.  This can be done the end of this week or the beginning of next week. You've also been given a referral to Dr. Redmond Baseman who is an ENT specialist who can check the healing of your nasal bone fractures

## 2016-11-12 NOTE — ED Notes (Signed)
Patient ambulated to restroom independently with NT at side for safety. Gait steady. Patient escorted back to room by RN.

## 2016-11-12 NOTE — ED Notes (Signed)
Barbaraann Rondo, NP at bedside at this time.

## 2016-11-12 NOTE — ED Provider Notes (Signed)
[facility] DEPT Provider Note   CSN: [id number] Arrival date & time: 11/12/16  1609     History   Chief Complaint Chief Complaint  Patient presents with  . Head Injury  . Fall    HPI AUGUSTINE LEVERETTE is a 71 y.o. male.  71 year old man who was walking on the street when he misstepped on the curb and fell, hitting his head against the parking meter, pole, sustaining a laceration to his left eyebrow, and abrasion to his right knee.  Denies loss of consciousness, nausea, visual change.      Past Medical History:  Diagnosis Date  . Cancer [facility])    hepatocellular cancer   . Diabetes mellitus without complication ([place])   . Hepatitis   . Hyperlipidemia   . Hypertension   . Narcolepsy    per office visit note of 08/2011   . Sleep apnea     Patient Active Problem List   Diagnosis Date Noted  . 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 Altered mental status 03/05/2013  . Respiratory failure, post-operative ([place]) 03/02/2013  . Hepatitis B 02/24/2013  . Cancer, hepatocellular ([place]) 02/10/2013  . Liver tumor-bleeding 01/15/2013  . Epidermal cyst 06/18/2012  . OSA (obstructive sleep apnea) 03/13/2012  . Abnormal leg movement 12/12/2011  . Blood pressure elevated 09/06/2011  . Diabetes mellitus 09/06/2011  . 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 BPH (benign prostatic hyperplasia) 09/06/2011  . Diverticula of colon 09/06/2011    Past Surgical History:  Procedure Laterality Date  . LAPAROSCOPY  03/02/2013   Procedure: LAPAROSCOPY DIAGNOSTIC;  Surgeon: [name], MD;  Location: [facility] ORS;  Service: General;;  . LIVER ULTRASOUND  03/02/2013   Procedure: LIVER ULTRASOUND;  Surgeon: [name], MD;  Location: [facility];  Service: General;;  . OPEN PARTIAL HEPATECTOMY   03/02/2013   Procedure: OPEN PARTIAL HEPATECTOMY [83];  Surgeon: [name], MD;  Location: [facility];  Service: General;;  DX LAPAROSCOPY, INTRAOPERATIVE LIVER ULTRASOUND, OPEN PARTIAL HEPATECTOMY  . pinched nerve in  back         Home Medications    Prior to Admission medications   Medication Sig Start Date End Date Taking? Authorizing Provider  alfuzosin (UROXATRAL) 10 MG 24 hr tablet Take 10 mg by mouth at bedtime.  12/19/12   Historical Provider, MD  amLODipine (NORVASC) 5 MG tablet Take 1 tablet by mouth  daily 11/09/15   Wendie Agreste, MD  amphetamine-dextroamphetamine (ADDERALL XR) 30 MG 24 hr capsule Take 1 capsule (30 mg total) by mouth every morning. 07/09/16   Larey Seat, MD  aspirin 81 MG tablet Take 81 mg by mouth every morning.     Historical Provider, MD  ferrous sulfate 325 (65 FE) MG EC tablet Take 1 tablet (325 mg total) by mouth 3 (three) times daily with meals. 10/01/16   Ladell Pier, MD  glipiZIDE (GLUCOTROL) 5 MG tablet TAKE 1 TABLET BY MOUTH  TWICE A DAY BEFORE MEALS 09/13/16   Mancel Bale, PA-C  JANUVIA 100 MG tablet TAKE 1 TABLET BY MOUTH  DAILY 09/19/16   Mancel Bale, PA-C  losartan (COZAAR) 25 MG tablet TAKE 1 TABLET BY MOUTH  DAILY 09/13/16   Mancel Bale, PA-C  metFORMIN (GLUCOPHAGE) 1000 MG tablet Take 1 tablet (1,000 mg total) by mouth 2 (two) times daily with a meal. 04/19/16   Wendie Agreste, MD  metoprolol succinate (TOPROL-XL) 50 MG 24 hr tablet TAKE 1 TABLET BY MOUTH  DAILY 09/13/16   Judson Roch  L Weber, PA-C  oxyCODONE (ROXICODONE) 5 MG immediate release tablet Take 1 tablet (5 mg total) by mouth every 6 (six) hours as needed for severe pain. 11/12/16   Junius Creamer, NP  pravastatin (PRAVACHOL) 20 MG tablet Take 1 tablet (20 mg total) by mouth at bedtime. 04/19/16   Wendie Agreste, MD  spironolactone (ALDACTONE) 25 MG tablet Take 1 tablet (25 mg total) by mouth 2 (two) times daily. 04/19/16   Wendie Agreste, MD    Family History Family History  Problem Relation Age of Onset  . Dementia Mother   . Cancer Father   . Diabetes Brother   . Hypertension Brother   . Cancer Brother     Social History Social History  Substance Use Topics  . Smoking status:  Former Research scientist (life sciences)  . Smokeless tobacco: Never Used  . Alcohol use No     Allergies   Ace inhibitors   Review of Systems Review of Systems  Constitutional: Negative for fever.  Eyes: Negative for visual disturbance.  Musculoskeletal: Negative for neck pain.  Neurological: Positive for headaches ( ). Negative for dizziness and syncope.  All other systems reviewed and are negative.    Physical Exam Updated Vital Signs BP (!) 157/85   Pulse 94   Temp 98 F (36.7 C) (Oral)   Resp 16   SpO2 99%   Physical Exam  Constitutional: He appears well-developed and well-nourished. No distress.  HENT:  Head: Normocephalic.    Right Ear: External ear normal.  Left Ear: External ear normal.  Eyes:    Neck: Normal range of motion.  Cardiovascular: Normal rate.   Pulmonary/Chest: Effort normal.  Abdominal: Soft.  Musculoskeletal: Normal range of motion.       Legs: Neurological: He is alert.  Skin: Skin is warm and dry.  Psychiatric: He has a normal mood and affect.  Nursing note and vitals reviewed.    ED Treatments / Results  Labs (all labs ordered are listed, but only abnormal results are displayed) Labs Reviewed - No data to display  EKG  EKG Interpretation None       Radiology Ct Head Wo Contrast  Result Date: 11/12/2016 CLINICAL DATA:  Patient fell hitting left side of face and head on concrete. EXAM: CT HEAD WITHOUT CONTRAST CT CERVICAL SPINE WITHOUT CONTRAST TECHNIQUE: Multidetector CT imaging of the head and cervical spine was performed following the standard protocol without intravenous contrast. Multiplanar CT image reconstructions of the cervical spine were also generated. COMPARISON:  CT head report from 11/03/2002 FINDINGS: CT HEAD FINDINGS Brain: No acute intracranial hemorrhage. Chronic small vessel ischemic disease of periventricular white matter. No intra-axial mass nor extra-axial fluid collections. No large vascular territory infarcts. Vascular:  Atherosclerotic calcifications of the cavernous sinus carotids bilaterally. Skull: No skull fracture. Sinuses/Orbits: Left periorbital soft tissue swelling, laceration and soft tissue debris. Intact globes bilaterally. Other: Buckled appearance of the left nasal bone, consistent with an age-indeterminate fracture. CT CERVICAL SPINE FINDINGS Alignment: Straightening of cervical lordosis. Craniocervical relationship appears intact. Skull base and vertebrae: No skullbase nor vertebral fracture. Soft tissues and spinal canal: No prevertebral soft tissue swelling. No visible canal hematoma. Mild bony osseous central canal stenosis at C5-6. Disc levels: Degenerative disc disease C4-5 and C6-7 with well-incorporated interbody fusion at C5-6. Uncovertebral spurring at C4-5 bilaterally contributing to mild neural foraminal stenosis. Upper chest: Negative. Other: None IMPRESSION: 1. Left supraorbital laceration with soft tissue swelling. No underlying fracture. 2. Age indeterminate left nasal bone fracture  with slight buckling. 3. Chronic small vessel ischemic disease of periventricular white matter. No acute intracranial abnormality. 4. No acute cervical spine fracture. 5. C5-6 cervical fusion with interbody block. Degenerative disc disease above and below the fusion at C4-5 and C6-7. Electronically Signed   By: Ashley Royalty M.D.   On: 11/12/2016 17:39   Ct Cervical Spine Wo Contrast  Result Date: 11/12/2016 CLINICAL DATA:  Patient fell hitting left side of face and head on concrete. EXAM: CT HEAD WITHOUT CONTRAST CT CERVICAL SPINE WITHOUT CONTRAST TECHNIQUE: Multidetector CT imaging of the head and cervical spine was performed following the standard protocol without intravenous contrast. Multiplanar CT image reconstructions of the cervical spine were also generated. COMPARISON:  CT head report from 11/03/2002 FINDINGS: CT HEAD FINDINGS Brain: No acute intracranial hemorrhage. Chronic small vessel ischemic disease of  periventricular white matter. No intra-axial mass nor extra-axial fluid collections. No large vascular territory infarcts. Vascular: Atherosclerotic calcifications of the cavernous sinus carotids bilaterally. Skull: No skull fracture. Sinuses/Orbits: Left periorbital soft tissue swelling, laceration and soft tissue debris. Intact globes bilaterally. Other: Buckled appearance of the left nasal bone, consistent with an age-indeterminate fracture. CT CERVICAL SPINE FINDINGS Alignment: Straightening of cervical lordosis. Craniocervical relationship appears intact. Skull base and vertebrae: No skullbase nor vertebral fracture. Soft tissues and spinal canal: No prevertebral soft tissue swelling. No visible canal hematoma. Mild bony osseous central canal stenosis at C5-6. Disc levels: Degenerative disc disease C4-5 and C6-7 with well-incorporated interbody fusion at C5-6. Uncovertebral spurring at C4-5 bilaterally contributing to mild neural foraminal stenosis. Upper chest: Negative. Other: None IMPRESSION: 1. Left supraorbital laceration with soft tissue swelling. No underlying fracture. 2. Age indeterminate left nasal bone fracture with slight buckling. 3. Chronic small vessel ischemic disease of periventricular white matter. No acute intracranial abnormality. 4. No acute cervical spine fracture. 5. C5-6 cervical fusion with interbody block. Degenerative disc disease above and below the fusion at C4-5 and C6-7. Electronically Signed   By: Ashley Royalty M.D.   On: 11/12/2016 17:39    Procedures .[name] KitchenLaceration Repair Date/Time: 11/12/2016 9:57 PM Performed by: Junius Creamer Authorized by: Junius Creamer   Consent:    Consent obtained:  Verbal   Consent given by:  Patient   Risks discussed:  Pain, infection, poor cosmetic result and poor wound healing   Alternatives discussed:  No treatment Anesthesia (see MAR for exact dosages):    Anesthesia method:  Topical application and local infiltration   Topical anesthetic:   LET   Local anesthetic:  Lidocaine 1% w/o epi Laceration details:    Location:  Face   Face location:  L eyebrow   Length (cm):  3   Depth (mm):  2 Repair type:    Repair type:  Intermediate Pre-procedure details:    Preparation:  Patient was prepped and draped in usual sterile fashion Exploration:    Contaminated: no   Treatment:    Area cleansed with:  Saline   Amount of cleaning:  Standard   Irrigation solution:  Sterile saline   Visualized foreign bodies/material removed: no   Subcutaneous repair:    Suture size:  5-0   Suture material:  Vicryl   Suture technique:  Simple interrupted   Number of sutures:  5 Skin repair:    Repair method:  Sutures Approximation:    Approximation:  Close   Vermilion border: well-aligned   Post-procedure details:    Dressing:  Antibiotic ointment   Patient tolerance of procedure:  Tolerated well, no  immediate complications    (including critical care time)  Medications Ordered in ED Medications  oxyCODONE-acetaminophen (PERCOCET/ROXICET) 5-325 MG per tablet 1 tablet (1 tablet Oral Given 11/12/16 2028)  lidocaine-EPINEPHrine-tetracaine (LET) solution (3 mLs Topical Given by Other 11/12/16 2029)  lidocaine (PF) (XYLOCAINE) 1 % injection 2 mL (2 mLs Infiltration Given by Other 11/12/16 2028)     Initial Impression / Assessment and Plan / ED Course  I have reviewed the triage vital signs and the nursing notes.  Pertinent labs & imaging results that were available during my care of the patient were reviewed by me and considered in my medical decision making (see chart for details).    LACERATION REPAIR Performed by: Garald Balding Authorized by: Garald Balding Consent: Verbal consent obtained. Risks and benefits: risks, benefits and alternatives were discussed Consent given by: patient Patient identity confirmed: provided demographic data Prepped and Draped in normal sterile fashion Wound explored  Laceration Location:  nose  Laceration Length: 05cm  No Foreign Bodies seen or palpated  Anesthesia: local infiltration  Local anesthetic: lidocaine 1 without epinephrine  Anesthetic total: 05 ml  Irrigation method: syringe Amount of cleaning: standard  Skin closure: simple  Number of sutures: 1  Technique: int.   Patient tolerance: Patient tolerated the procedure well with no immediate complications.  Patient's wounds have been sutured.  He's been instructed to follow-up with his primary care physician for suture removal in 5 days.  He's also been given referrals to ENT for monitoring of his nasal bone fractures.  He states he has an ophthalmologist that he will make an appoint with hand this week with beginning of next week to monitor the subconjunctival hemorrhage healing.  He's been given.  Return parameters   Final Clinical Impressions(s) / ED Diagnoses   Final diagnoses:  Fall, initial encounter  Facial laceration, initial encounter  Closed fracture of nasal bone, initial encounter  Subconjunctival hemorrhage of left eye    New Prescriptions New Prescriptions   OXYCODONE (ROXICODONE) 5 MG IMMEDIATE RELEASE TABLET    Take 1 tablet (5 mg total) by mouth every 6 (six) hours as needed for severe pain.     Junius Creamer, NP 11/12/16 [place], NP 11/12/16 [place], NP 11/12/16 [id number]    Quintella Reichert, MD 11/13/16 [phone]

## 2016-11-13 DIAGNOSIS — S0512XA Contusion of eyeball and orbital tissues, left eye, initial encounter: Secondary | ICD-10-CM | POA: Diagnosis not present

## 2016-11-13 DIAGNOSIS — H1132 Conjunctival hemorrhage, left eye: Secondary | ICD-10-CM | POA: Diagnosis not present

## 2016-11-14 ENCOUNTER — Encounter: Payer: Self-pay | Admitting: Interventional Cardiology

## 2016-11-26 NOTE — Progress Notes (Signed)
Cardiology Office Note    Date:  11/27/2016   ID:  [name], DOB August 12, 1946, MRN [id number]  PCP:  Wendie Agreste, MD  Cardiologist: Sinclair Grooms, MD   Chief Complaint  Patient presents with  . Coronary Artery Disease    Possible old MI    History of Present Illness:  [name] is a 71 y.o. male hepatocellular cancer, hyperlipidemia, DM II, hypertension, OSA, and possible prior MI.  This is a pleasant gentleman referred by Dr. Lizbeth Bark. He is concerned about a diagnosis of "old heart attack". He has never had symptoms or admission to the hospital for such. He was told in the past, 2014, by Dr.Harwani that his EKG indicated such. He denies exertional chest pain, orthopnea, PND, palpitations, PND, and syncope.  He is relatively sedentary. He doesn't smoke.  Past Medical History:  Diagnosis Date  . Cancer [facility])    hepatocellular cancer   . Diabetes mellitus without complication ([place])   . Hepatitis   . Hyperlipidemia   . Hypertension   . Narcolepsy    per office visit note of 08/2011   . Sleep apnea     Past Surgical History:  Procedure Laterality Date  . LAPAROSCOPY  03/02/2013   Procedure: LAPAROSCOPY DIAGNOSTIC;  Surgeon: [name], MD;  Location: [facility];  Service: General;;  . LIVER ULTRASOUND  03/02/2013   Procedure: LIVER ULTRASOUND;  Surgeon: [name], MD;  Location: [facility];  Service: General;;  . OPEN PARTIAL HEPATECTOMY   03/02/2013   Procedure: OPEN PARTIAL HEPATECTOMY [83];  Surgeon: [name], MD;  Location: [facility];  Service: General;;  DX LAPAROSCOPY, INTRAOPERATIVE LIVER ULTRASOUND, OPEN PARTIAL HEPATECTOMY  . pinched nerve in back      Current Medications: Outpatient Medications Prior to Visit  Medication Sig Dispense Refill  . alfuzosin (UROXATRAL) 10 MG 24 hr tablet Take 10 mg by mouth at bedtime.     [name] Kitchen amphetamine-dextroamphetamine (ADDERALL XR) 30 MG 24 hr capsule Take 1 capsule (30 mg total) by mouth every morning.  90 capsule 0  . ferrous sulfate 325 (65 FE) MG EC tablet Take 1 tablet (325 mg total) by mouth 3 (three) times daily with meals. 90 tablet 0  . glipiZIDE (GLUCOTROL) 5 MG tablet TAKE 1 TABLET BY MOUTH  TWICE A DAY BEFORE MEALS 180 tablet 0  . JANUVIA 100 MG tablet TAKE 1 TABLET BY MOUTH  DAILY 90 tablet 0  . losartan (COZAAR) 25 MG tablet TAKE 1 TABLET BY MOUTH  DAILY 90 tablet 0  . metFORMIN (GLUCOPHAGE) 1000 MG tablet Take 1 tablet (1,000 mg total) by mouth 2 (two) times daily with a meal. 180 tablet 1  . metoprolol succinate (TOPROL-XL) 50 MG 24 hr tablet TAKE 1 TABLET BY MOUTH  DAILY 90 tablet 0  . pravastatin (PRAVACHOL) 20 MG tablet Take 1 tablet (20 mg total) by mouth at bedtime. 90 tablet 1  . spironolactone (ALDACTONE) 25 MG tablet Take 1 tablet (25 mg total) by mouth 2 (two) times daily. 180 tablet 1  . amLODipine (NORVASC) 5 MG tablet Take 1 tablet by mouth  daily 90 tablet 0  . aspirin 81 MG tablet Take 81 mg by mouth every morning.     [name] Kitchen oxyCODONE (ROXICODONE) 5 MG immediate release tablet Take 1 tablet (5 mg total) by mouth every 6 (six) hours as needed for severe pain. 12 tablet 0   No facility-administered medications prior to visit.  Allergies:   Ace inhibitors   Social History   Social History  . Marital status: Single    Spouse name: N/A  . Number of children: 2  . Years of education: 15+   Social History Main Topics  . Smoking status: Former Research scientist (life sciences)  . Smokeless tobacco: Never Used  . Alcohol use No  . Drug use: No  . Sexual activity: Yes   Other Topics Concern  . None   Social History Narrative   Patient is single and lives alone- became widower when 1 child was 52 year old, divorced 2nd marriage   Has #2 grown children, not in area or involved in his life   Patient is retired.   Patient has a college education.   Patient is right-handed.   Patient drinks maybe two sodas daily.     Family History:  The patient's family history includes Cancer in his  brother and father; Dementia in his mother; Diabetes in his brother; Hypertension in his brother.   ROS:   Please see the history of present illness.    History of hepatic cyst. History of hearing loss, recent chills, and prior history of intestinal bleeding. States that aspirin was taken away.  All other systems reviewed and are negative.   PHYSICAL EXAM:   VS:  BP 126/76 (BP Location: Left Arm)   Pulse 79   Ht 5\' 9"  (1.753 m)   Wt 217 lb (98.4 kg)   BMI 32.05 kg/m    GEN: Well nourished, well developed, in no acute distress  HEENT: normal  Neck: no JVD, carotid bruits, or masses Cardiac: RRR; no murmurs, rubs, or gallops,no edema  Respiratory:  clear to auscultation bilaterally, normal work of breathing GI: soft, nontender, nondistended, + BS MS: no deformity or atrophy  Skin: warm and dry, no rash Neuro:  Alert and Oriented x 3, Strength and sensation are intact Psych: euthymic mood, full affect  Wt Readings from Last 3 Encounters:  11/27/16 217 lb (98.4 kg)  10/01/16 218 lb 4.8 oz (99 kg)  07/30/16 221 lb 6.4 oz (100.4 kg)      Studies/Labs Reviewed:   EKG:  EKG  Sinus rhythm, Q waves in lead III and aVF. No change compared to prior tracings.  Recent Labs: 12/14/2015: ALT 40 09/03/2016: BUN 13.2; Creatinine 1.1; Potassium 4.4; Sodium 138 10/01/2016: HGB 6.1; Platelets 143   Lipid Panel    Component Value Date/Time   CHOL 126 12/14/2015 1337   TRIG 91 12/14/2015 1337   HDL 48 12/14/2015 1337   CHOLHDL 2.6 12/14/2015 1337   VLDL 18 12/14/2015 1337   LDLCALC 60 12/14/2015 1337    Additional studies/ records that were reviewed today include:  CT scan performed this year reveals significant aortic atherosclerosis. Personally reviewed.    ASSESSMENT:    1. Old MI (myocardial infarction)   2. Aortic atherosclerosis ([place])   3. Type 2 diabetes mellitus with other circulatory complication, without long-term current use of insulin ([place])   4. OSA (obstructive sleep  apnea)      PLAN:  In order of problems listed above:  1. This is based upon EKG. No symptoms to corroborate the diagnosis. Multiple risk factors including diabetes and documented aortic atherosclerosis by CT. Plan to perform a stress or Lexiscan Myoview to determine if there is evidence of infarction or ischemia. Needs aggressive risk factor modification. Need to determine if he can take antiplatelet therapy. 2. Aggressive risk factor modification is needed. LDL cholesterol less  than 70 is advocated. Needs tight diabetes control. 3. Tight diabetes control is advocated. Hemoglobin A1c less than 7. 4. Advocated use of Cipro pass instructed.    Medication Adjustments/Labs and Tests Ordered: Current medicines are reviewed at length with the patient today.  Concerns regarding medicines are outlined above.  Medication changes, Labs and Tests ordered today are listed in the Patient Instructions below. Patient Instructions  Medication Instructions:  None  Labwork: None  Testing/Procedures: Your physician has requested that you have en exercise stress myoview. For further information please visit [email or web address]. Please follow instruction sheet, as given.    Follow-Up: Your physician recommends that you schedule a follow-up appointment in: 1 month with Dr. Tamala Julian.    Any Other Special Instructions Will Be Listed Below (If Applicable).     If you need a refill on your cardiac medications before your next appointment, please call your pharmacy.      Signed, Sinclair Grooms, MD  11/27/2016 9:33 AM    [facility] [place], [place], [place]  [address] Phone: [phone]; Fax: [phone]

## 2016-11-27 ENCOUNTER — Ambulatory Visit (INDEPENDENT_AMBULATORY_CARE_PROVIDER_SITE_OTHER): Payer: Medicare Other | Admitting: Interventional Cardiology

## 2016-11-27 ENCOUNTER — Encounter (INDEPENDENT_AMBULATORY_CARE_PROVIDER_SITE_OTHER): Payer: Self-pay

## 2016-11-27 ENCOUNTER — Encounter: Payer: Self-pay | Admitting: Interventional Cardiology

## 2016-11-27 VITALS — BP 126/76 | HR 79 | Ht 69.0 in | Wt 217.0 lb

## 2016-11-27 DIAGNOSIS — I252 Old myocardial infarction: Secondary | ICD-10-CM

## 2016-11-27 DIAGNOSIS — E1159 Type 2 diabetes mellitus with other circulatory complications: Secondary | ICD-10-CM | POA: Diagnosis not present

## 2016-11-27 DIAGNOSIS — G4733 Obstructive sleep apnea (adult) (pediatric): Secondary | ICD-10-CM

## 2016-11-27 DIAGNOSIS — I700 Atherosclerosis of aorta: Secondary | ICD-10-CM

## 2016-11-27 NOTE — Patient Instructions (Signed)
Medication Instructions:  None  Labwork: None  Testing/Procedures: Your physician has requested that you have en exercise stress myoview. For further information please visit [email or web address]. Please follow instruction sheet, as given.    Follow-Up: Your physician recommends that you schedule a follow-up appointment in: 1 month with Dr. Tamala Julian.    Any Other Special Instructions Will Be Listed Below (If Applicable).     If you need a refill on your cardiac medications before your next appointment, please call your pharmacy.

## 2016-11-29 ENCOUNTER — Telehealth (HOSPITAL_COMMUNITY): Payer: Self-pay | Admitting: *Deleted

## 2016-11-29 NOTE — Telephone Encounter (Signed)
Patient given detailed instructions per Myocardial Perfusion Study Information Sheet for the test on 12/03/16 Patient notified to arrive 15 minutes early and that it is imperative to arrive on time for appointment to keep from having the test rescheduled.  If you need to cancel or reschedule your appointment, please call the office within 24 hours of your appointment. Failure to do so may result in a cancellation of your appointment, and a $50 no show fee. Patient verbalized understanding. Shatara Stanek Jacqueline    

## 2016-11-30 DIAGNOSIS — D500 Iron deficiency anemia secondary to blood loss (chronic): Secondary | ICD-10-CM | POA: Diagnosis not present

## 2016-12-03 ENCOUNTER — Telehealth (HOSPITAL_COMMUNITY): Payer: Self-pay | Admitting: *Deleted

## 2016-12-03 ENCOUNTER — Ambulatory Visit (HOSPITAL_COMMUNITY): Payer: Medicare Other

## 2016-12-03 NOTE — Telephone Encounter (Signed)
Left message on voicemail per DPR in reference to upcoming appointment scheduled on 12/05/16 at 0730 with detailed instructions given per Myocardial Perfusion Study Information Sheet for the test. LM to arrive 15 minutes early, and that it is imperative to arrive on time for appointment to keep from having the test rescheduled. If you need to cancel or reschedule your appointment, please call the office within 24 hours of your appointment. Failure to do so may result in a cancellation of your appointment, and a $50 no show fee. Phone number given for call back for any questions.

## 2016-12-05 ENCOUNTER — Ambulatory Visit (HOSPITAL_COMMUNITY): Payer: Medicare Other | Attending: Interventional Cardiology

## 2016-12-05 DIAGNOSIS — I252 Old myocardial infarction: Secondary | ICD-10-CM | POA: Insufficient documentation

## 2016-12-05 DIAGNOSIS — E1159 Type 2 diabetes mellitus with other circulatory complications: Secondary | ICD-10-CM | POA: Insufficient documentation

## 2016-12-05 LAB — MYOCARDIAL PERFUSION IMAGING
LV dias vol: 92 mL (ref 62–150)
LV sys vol: 28 mL
Peak HR: 110 {beats}/min
RATE: 0.31
Rest BP: 146 mmHg
Rest HR: 70 {beats}/min
SDS: 2
SRS: 3
SSS: 5
TID: 0.84

## 2016-12-05 MED ORDER — REGADENOSON 0.4 MG/5ML IV SOLN
0.4000 mg | Freq: Once | INTRAVENOUS | Status: AC
  Administered 2016-12-05: 0.4 mg via INTRAVENOUS

## 2016-12-05 MED ORDER — TECHNETIUM TC 99M TETROFOSMIN IV KIT
10.3000 | PACK | Freq: Once | INTRAVENOUS | Status: AC | PRN
  Administered 2016-12-05: 10.3 via INTRAVENOUS
  Filled 2016-12-05: qty 11

## 2016-12-05 MED ORDER — TECHNETIUM TC 99M TETROFOSMIN IV KIT
31.9000 | PACK | Freq: Once | INTRAVENOUS | Status: AC | PRN
  Administered 2016-12-05: 31.9 via INTRAVENOUS
  Filled 2016-12-05: qty 32

## 2016-12-11 ENCOUNTER — Telehealth: Payer: Self-pay | Admitting: Interventional Cardiology

## 2016-12-11 NOTE — Telephone Encounter (Signed)
Pt walk in form received that states pt would like for someone to contact doctor about a cardiac clearance.  Left message for pt to call back.  Will advise pt that office requesting clearance will need to contact our office with the information.

## 2016-12-11 NOTE — Telephone Encounter (Signed)
Walk In Pt Form-please contact patient about Clearance. Placed in [employer].

## 2016-12-13 NOTE — Telephone Encounter (Signed)
Spoke with pt and advised him that the surgeon's office needs to contact our office or fax over a surgical clearance form so that we can give proper clearance.  Pt verbalized understanding and will contact that office.  Pt appreciative for call.

## 2016-12-14 ENCOUNTER — Telehealth: Payer: Self-pay | Admitting: Interventional Cardiology

## 2016-12-14 NOTE — Telephone Encounter (Signed)
Pt recently prescribed ASA 81mg  QD after stress test performed.  Barbara with [facility] calling as pt's Hgb is 11.5, low but improved from past readings.  She states Dr. Watt Climes was wondering if we could cut back on this or d/c it?  Pt had recent Endo and no bleeding noted.  Pt will have colonoscopy soon.  No known bleeding noted at this time.  Advised I will send message to Dr. Tamala Julian for review and advisement.

## 2016-12-14 NOTE — Telephone Encounter (Signed)
New Message:   Pt Hemoglobin is low,he is taking an Aspirin a day.She wants to know if pt can stop Aspirin or not take it every day?

## 2016-12-15 NOTE — Telephone Encounter (Signed)
Okay to stop aspirin until bleeding issues resolved.

## 2016-12-18 NOTE — Telephone Encounter (Signed)
Spoke with Pamala Hurry and made her aware of recommendations per Dr. Tamala Julian.  Pamala Hurry verbalized understanding and was already aware of these recommendations.

## 2016-12-27 DIAGNOSIS — R195 Other fecal abnormalities: Secondary | ICD-10-CM | POA: Diagnosis not present

## 2016-12-27 DIAGNOSIS — D500 Iron deficiency anemia secondary to blood loss (chronic): Secondary | ICD-10-CM | POA: Diagnosis not present

## 2016-12-27 DIAGNOSIS — D126 Benign neoplasm of colon, unspecified: Secondary | ICD-10-CM | POA: Diagnosis not present

## 2016-12-27 DIAGNOSIS — K635 Polyp of colon: Secondary | ICD-10-CM | POA: Diagnosis not present

## 2017-01-01 DIAGNOSIS — D126 Benign neoplasm of colon, unspecified: Secondary | ICD-10-CM | POA: Diagnosis not present

## 2017-01-01 DIAGNOSIS — K635 Polyp of colon: Secondary | ICD-10-CM | POA: Diagnosis not present

## 2017-01-05 ENCOUNTER — Ambulatory Visit (INDEPENDENT_AMBULATORY_CARE_PROVIDER_SITE_OTHER): Payer: Medicare Other | Admitting: Family Medicine

## 2017-01-05 ENCOUNTER — Encounter: Payer: Self-pay | Admitting: Family Medicine

## 2017-01-05 VITALS — BP 121/76 | HR 100 | Temp 98.6°F | Resp 16 | Ht 69.0 in | Wt 211.0 lb

## 2017-01-05 DIAGNOSIS — E785 Hyperlipidemia, unspecified: Secondary | ICD-10-CM

## 2017-01-05 DIAGNOSIS — D509 Iron deficiency anemia, unspecified: Secondary | ICD-10-CM | POA: Diagnosis not present

## 2017-01-05 DIAGNOSIS — I10 Essential (primary) hypertension: Secondary | ICD-10-CM | POA: Diagnosis not present

## 2017-01-05 DIAGNOSIS — E119 Type 2 diabetes mellitus without complications: Secondary | ICD-10-CM

## 2017-01-05 LAB — POCT CBC
Granulocyte percent: 61 %G (ref 37–80)
HCT, POC: 33.4 % — AB (ref 43.5–53.7)
Hemoglobin: 11.5 g/dL — AB (ref 14.1–18.1)
Lymph, poc: 1.2 (ref 0.6–3.4)
MCH, POC: 30.6 pg (ref 27–31.2)
MCHC: 34.4 g/dL (ref 31.8–35.4)
MCV: 88.9 fL (ref 80–97)
MID (cbc): 0.2 (ref 0–0.9)
MPV: 6.7 fL (ref 0–99.8)
POC Granulocyte: 2.1 (ref 2–6.9)
POC LYMPH PERCENT: 34.4 %L (ref 10–50)
POC MID %: 4.6 %M (ref 0–12)
Platelet Count, POC: 120 10*3/uL — AB (ref 142–424)
RBC: 3.76 M/uL — AB (ref 4.69–6.13)
RDW, POC: 14.9 %
WBC: 3.5 10*3/uL — AB (ref 4.6–10.2)

## 2017-01-05 MED ORDER — GLIPIZIDE 5 MG PO TABS: ORAL_TABLET | ORAL | 1 refills | Status: DC

## 2017-01-05 MED ORDER — LOSARTAN POTASSIUM 25 MG PO TABS: 25.0000 mg | ORAL_TABLET | Freq: Every day | ORAL | 1 refills | Status: DC

## 2017-01-05 MED ORDER — METOPROLOL SUCCINATE ER 50 MG PO TB24: 50.0000 mg | ORAL_TABLET | Freq: Every day | ORAL | 1 refills | Status: DC

## 2017-01-05 MED ORDER — SPIRONOLACTONE 25 MG PO TABS: 25.0000 mg | ORAL_TABLET | Freq: Two times a day (BID) | ORAL | 1 refills | Status: DC

## 2017-01-05 MED ORDER — PRAVASTATIN SODIUM 20 MG PO TABS: 20.0000 mg | ORAL_TABLET | Freq: Every day | ORAL | 1 refills | Status: DC

## 2017-01-05 MED ORDER — METFORMIN HCL 1000 MG PO TABS: 1000.0000 mg | ORAL_TABLET | Freq: Two times a day (BID) | ORAL | 1 refills | Status: DC

## 2017-01-05 MED ORDER — SITAGLIPTIN PHOSPHATE 100 MG PO TABS: 100.0000 mg | ORAL_TABLET | Freq: Every day | ORAL | 1 refills | Status: DC

## 2017-01-05 NOTE — Patient Instructions (Addendum)
Restart diabetes medicine as planned. In the future please follow-up prior to that medication running out so we can better assess control of your medical conditions. Let me know if there are any concerns with follow-up or if you need a temporary refill if unable to return in time.  Recheck in 3 months for diabetes. I will let you know if medication changes are needed once I see your results.  Hemoglobin is improved today, may need to restart iron as planned after next visit with gastroenterologist. If your fatigue is not improving within the next 2 weeks, return to discuss that further. Sooner or to the emergency room if symptoms are worsening.   Fatigue Fatigue is feeling tired all of the time, a lack of energy, or a lack of motivation. Occasional or mild fatigue is often a normal response to activity or life in general. However, long-lasting (chronic) or extreme fatigue may indicate an underlying medical condition. Follow these instructions at home: Watch your fatigue for any changes. The following actions may help to lessen any discomfort you are feeling:  Talk to your health care provider about how much sleep you need each night. Try to get the required amount every night.  Take medicines only as directed by your health care provider.  Eat a healthy and nutritious diet. Ask your health care provider if you need help changing your diet.  Drink enough fluid to keep your urine clear or pale yellow.  Practice ways of relaxing, such as yoga, meditation, massage therapy, or acupuncture.  Exercise regularly.  Change situations that cause you stress. Try to keep your work and personal routine reasonable.  Do not abuse illegal drugs.  Limit alcohol intake to no more than 1 drink per day for nonpregnant women and 2 drinks per day for men. One drink equals 12 ounces of beer, 5 ounces of wine, or 1 ounces of hard liquor.  Take a multivitamin, if directed by your health care provider. Contact  a health care provider if:  Your fatigue does not get better.  You have a fever.  You have unintentional weight loss or gain.  You have headaches.  You have difficulty:  Falling asleep.  Sleeping throughout the night.  You feel angry, guilty, anxious, or sad.  You are unable to have a bowel movement (constipation).  You skin is dry.  Your legs or another part of your body is swollen. Get help right away if:  You feel confused.  Your vision is blurry.  You feel faint or pass out.  You have a severe headache.  You have severe abdominal, pelvic, or back pain.  You have chest pain, shortness of breath, or an irregular or fast heartbeat.  You are unable to urinate or you urinate less than normal.  You develop abnormal bleeding, such as bleeding from the rectum, vagina, nose, lungs, or nipples.  You vomit blood.  You have thoughts about harming yourself or committing suicide.  You are worried that you might harm someone else. This information is not intended to replace advice given to you by your health care provider. Make sure you discuss any questions you have with your health care provider. Document Released: 06/03/2007 Document Revised: 01/12/2016 Document Reviewed: 12/08/2013 Elsevier Interactive Patient Education  2017 [employer].    IF you received an x-ray today, you will receive an invoice from [facility]. Please contact [facility] at [phone] with questions or concerns regarding your invoice.   IF you received labwork today, you  will receive an invoice from [facility]. Please contact [facility] at [phone] with questions or concerns regarding your invoice.   Our billing staff will not be able to assist you with questions regarding bills from these companies.  You will be contacted with the lab results as soon as they are available. The fastest way to get your results is to activate your My Chart account. Instructions are located  on the last page of this paperwork. If you have not heard from [place] regarding the results in 2 weeks, please contact this office.

## 2017-01-05 NOTE — Progress Notes (Addendum)
Subjective:  By signing my name below, I, [name], attest that this documentation has been prepared under the direction and in the presence of [name], MD. Electronically Signed: [name], [place]. 01/05/2017 , 2:34 PM .  Patient was seen in Room 4 .   Patient ID: [name], male    DOB: February 10, 1946, 71 y.o.   MRN: [id number] Chief Complaint  Patient presents with  . Follow-up    DM check  . Medication Refill    Januvia, metoprolol, glipizide, metformin, pravastatin, losartan   HPI [name] is a 71 y.o. male Here for diabetic follow up. He is requesting medication refill of januvia, metoprolol, glipizide, metformin, pravastatin, and losartan. His last visit with me was in Aug 2017, plan for follow up in 3 months. He states he hasn't been seen due to financial reasons. He has been compliant with his medications. Last meal was at breakfast. Multiple other office visits reviewed.   DM Lab Results  Component Value Date   HGBA1C 7.9 04/19/2016   Lab Results  Component Value Date   MICROALBUR 0.8 04/19/2016   Restarted januvia 100mg  qd at that time, glipizide 5mg  bid, and continue metformin 1000mg  bid. He's been out of his medications since 5 days ago, except metformin, which ran out yesterday.   HTN Some question last visit about some of his medications. He was continued on toprol 50mg  qd, losartan 25mg  qd, and spirolactone 25mg  bid.    HLD Lab Results  Component Value Date   CHOL 126 12/14/2015   HDL 48 12/14/2015   LDLCALC 60 12/14/2015   TRIG 91 12/14/2015   CHOLHDL 2.6 12/14/2015   Lab Results  Component Value Date   ALT 40 12/14/2015   AST 39 (H) 12/14/2015   ALKPHOS 74 12/14/2015   BILITOT 0.9 12/14/2015   He takes pravastatin 20mg  qd.    Remote MI, CAD He has history of remote MI, CAD. He was last seen by cardiology on April 10th. He has possible old MI by EKG without chest pain. Plan for exercise stress test, performed on  April 18th, no evidence of prior heart attack or obstruction evidence. Recommended LDL <70 and aspirin qd. Low risk stress test.   History of Fall He had a fall about 4 weeks ago, when he was walking [place] in [place]. He denies blacking out. He was seen in ER on Mar 26th. He states he had misstep onto the curb and fell forward, hitting his head on concrete. He denies loss of sensation in his feet. He denies dizziness or lightheadedness. He denies slurred speech, weakness, or facial droop. Denies falls since that time, denies weakness.  Fatigue He reports feeling tired/fatigue for the past 6 weeks now. He was seen by Dr. Benay Spice for follow up of hepatocellular carcinoma on Feb 12th. He had severe microscopic anemia thought to be iron deficiency. His hemoglobin was 6.1 on Feb 12th that increased to 9.0 in March at [facility]. His most recent abdominal  CT was Jan of this year, negative recurrence of carcinoma, cirrhosis with varices - with those likely being cause of anemia. He denies shortness of breath or chest pain.   He was seen by Dr. Watt Climes for colonoscopy, not cancer. His iron and aspirin were held until recheck. He's been off iron for 2 weeks now.   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 Followed by neurologist, Dr. Brett Fairy.   Patient Active Problem List  Diagnosis Date Noted  . Aortic atherosclerosis ([place]) 11/27/2016  . 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 Altered mental status 03/05/2013  . Respiratory failure, post-operative ([place]) 03/02/2013  . Hepatitis B 02/24/2013  . Cancer, hepatocellular ([place]) 02/10/2013  . Liver tumor-bleeding 01/15/2013  . Epidermal cyst 06/18/2012  . OSA (obstructive sleep apnea) 03/13/2012  . Abnormal leg movement 12/12/2011  . Blood pressure elevated 09/06/2011  . Diabetes mellitus ([place]) 09/06/2011  . 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 BPH (benign prostatic hyperplasia) 09/06/2011  . Diverticula of colon 09/06/2011    Past Medical History:  Diagnosis Date  . Cancer [facility])    hepatocellular cancer   . Diabetes mellitus without complication ([place])   . Hepatitis   . Hyperlipidemia   . Hypertension   . Narcolepsy    per office visit note of 08/2011   . Sleep apnea    Past Surgical History:  Procedure Laterality Date  . LAPAROSCOPY  03/02/2013   Procedure: LAPAROSCOPY DIAGNOSTIC;  Surgeon: [name], MD;  Location: [facility] ORS;  Service: General;;  . LIVER ULTRASOUND  03/02/2013   Procedure: LIVER ULTRASOUND;  Surgeon: [name], MD;  Location: [facility];  Service: General;;  . OPEN PARTIAL HEPATECTOMY   03/02/2013   Procedure: OPEN PARTIAL HEPATECTOMY [83];  Surgeon: [name], MD;  Location: [facility];  Service: General;;  DX LAPAROSCOPY, INTRAOPERATIVE LIVER ULTRASOUND, OPEN PARTIAL HEPATECTOMY  . pinched nerve in back     Allergies  Allergen Reactions  . Ace Inhibitors Swelling    Angioedema - face.    Prior to Admission medications   Medication Sig Start Date End Date Taking? Authorizing Provider  alfuzosin (UROXATRAL) 10 MG 24 hr tablet Take 10 mg by mouth at bedtime.  12/19/12  Yes [provider]  amphetamine-dextroamphetamine (ADDERALL XR) 30 MG 24 hr capsule Take 1 capsule (30 mg total) by mouth every morning. 07/09/16  Yes Dohmeier, Asencion Partridge, MD  glipiZIDE (GLUCOTROL) 5 MG tablet TAKE 1 TABLET BY MOUTH  TWICE A DAY BEFORE MEALS 09/13/16  Yes Weber, Damaris Hippo, PA-C  JANUVIA 100 MG tablet TAKE 1 TABLET BY MOUTH  DAILY 09/19/16  Yes Weber, Sarah L, PA-C  losartan (COZAAR) 25 MG tablet TAKE 1 TABLET BY MOUTH  DAILY 09/13/16  Yes Weber, Sarah L, PA-C  metFORMIN (GLUCOPHAGE) 1000 MG tablet Take 1 tablet (1,000 mg total) by mouth 2 (two) times daily with a meal. 04/19/16  Yes Wendie Agreste, MD  metoprolol succinate (TOPROL-XL) 50 MG 24 hr tablet TAKE 1 TABLET BY MOUTH  DAILY 09/13/16  Yes Weber, Sarah L, PA-C  pravastatin (PRAVACHOL) 20 MG tablet Take 1 tablet (20 mg total) by mouth at bedtime.  04/19/16  Yes Wendie Agreste, MD  spironolactone (ALDACTONE) 25 MG tablet Take 1 tablet (25 mg total) by mouth 2 (two) times daily. 04/19/16  Yes Wendie Agreste, MD  ferrous sulfate 325 (65 FE) MG EC tablet Take 1 tablet (325 mg total) by mouth 3 (three) times daily with meals. Patient not taking: Reported on 01/05/2017 10/01/16   Ladell Pier, MD   Social History   Social History  . Marital status: Single    Spouse name: N/A  . Number of children: 2  . Years of education: 15+   Occupational History  . Not on file.   Social History Main Topics  . Smoking status: Former Research scientist (life sciences)  . Smokeless tobacco: Never Used  . Alcohol use No  . Drug use: No  . Sexual activity: Yes  Other Topics Concern  . Not on file   Social History Narrative   Patient is single and lives alone- became widower when 1 child was 63 year old, divorced 2nd marriage   Has #2 grown children, not in area or involved in his life   Patient is retired.   Patient has a college education.   Patient is right-handed.   Patient drinks maybe two sodas daily.   Review of Systems  Constitutional: Negative for fatigue and unexpected weight change.  Eyes: Negative for visual disturbance.  Respiratory: Negative for cough, chest tightness and shortness of breath.   Cardiovascular: Negative for chest pain, palpitations and leg swelling.  Gastrointestinal: Negative for abdominal pain and blood in stool.  Neurological: Negative for dizziness, light-headedness and headaches.       Objective:   Physical Exam  Constitutional: He is oriented to person, place, and time. He appears well-developed and well-nourished.  HENT:  Head: Normocephalic and atraumatic.  Eyes: EOM are normal. Pupils are equal, round, and reactive to light.  Neck: No JVD present. Carotid bruit is not present.  Cardiovascular: Normal rate, regular rhythm and normal heart sounds.  Exam reveals no gallop and no friction rub.   No murmur  heard. Pulmonary/Chest: Effort normal and breath sounds normal. He has no rales.  Abdominal: Soft. He exhibits no distension. There is no tenderness.  Musculoskeletal: He exhibits no edema.  Neurological: He is alert and oriented to person, place, and time.  Non focal neuro exam  Skin: Skin is warm and dry.  Psychiatric: He has a normal mood and affect.  Vitals reviewed.   Vitals:   01/05/17 1321  BP: 121/76  Pulse: 100  Resp: 16  Temp: 98.6 F (37 C)  TempSrc: Oral  SpO2: 99%  Weight: 211 lb (95.7 kg)  Height: 5\' 9"  (1.753 m)   Results for orders placed or performed in visit on 01/05/17  POCT CBC  Result Value Ref Range   WBC 3.5 (A) 4.6 - 10.2 K/uL   Lymph, poc 1.2 0.6 - 3.4   POC LYMPH PERCENT 34.4 10 - 50 %L   MID (cbc) 0.2 0 - 0.9   POC MID % 4.6 0 - 12 %M   POC Granulocyte 2.1 2 - 6.9   Granulocyte percent 61.0 37 - 80 %G   RBC 3.76 (A) 4.69 - 6.13 M/uL   Hemoglobin 11.5 (A) 14.1 - 18.1 g/dL   HCT, POC 33.4 (A) 43.5 - 53.7 %   MCV 88.9 80 - 97 fL   MCH, POC 30.6 27 - 31.2 pg   MCHC 34.4 31.8 - 35.4 g/dL   RDW, POC 14.9 %   Platelet Count, POC 120 (A) 142 - 424 K/uL   MPV 6.7 0 - 99.8 fL       Assessment & Plan:    [name] is a 71 y.o. male Type 2 diabetes mellitus without complication, without long-term current use of insulin ([place]) - Plan: Hemoglobin A1c, glipiZIDE (GLUCOTROL) 5 MG tablet, sitaGLIPtin (JANUVIA) 100 MG tablet, metFORMIN (GLUCOPHAGE) 1000 MG tablet  - Check A1c, continue same meds for now, but discussed need for routine visits every 3-6 months depending on glycemic control. If cost prohibitive for office visit, can look at possibly extending medication temporarily.  Microcytic anemia - Plan: POCT CBC  - Repeat CBC in office improved. Anticipate restart of iron at GI follow up.   Essential hypertension - Plan: Comprehensive metabolic panel, losartan (COZAAR) 25 MG tablet,  metoprolol succinate (TOPROL-XL) 50 MG 24 hr tablet,  spironolactone (ALDACTONE) 25 MG tablet  -Stable, continue same medications for now. Some confusion regarding medications in the past, but I went through each medication in office to verify he was taking those. Labs pending.   Hyperlipidemia, unspecified hyperlipidemia type - Plan: Lipid panel, pravastatin (PRAVACHOL) 20 MG tablet  - check labs, refilled pravastatin same dose as tolerating current dose.   Meds ordered this encounter  Medications  . glipiZIDE (GLUCOTROL) 5 MG tablet    Sig: TAKE 1 TABLET BY MOUTH  TWICE A DAY BEFORE MEALS    Dispense:  180 tablet    Refill:  1  . sitaGLIPtin (JANUVIA) 100 MG tablet    Sig: Take 1 tablet (100 mg total) by mouth daily.    Dispense:  90 tablet    Refill:  1  . losartan (COZAAR) 25 MG tablet    Sig: Take 1 tablet (25 mg total) by mouth daily.    Dispense:  90 tablet    Refill:  1  . metFORMIN (GLUCOPHAGE) 1000 MG tablet    Sig: Take 1 tablet (1,000 mg total) by mouth 2 (two) times daily with a meal.    Dispense:  180 tablet    Refill:  1  . metoprolol succinate (TOPROL-XL) 50 MG 24 hr tablet    Sig: Take 1 tablet (50 mg total) by mouth daily. Take with or immediately following a meal.    Dispense:  90 tablet    Refill:  1  . pravastatin (PRAVACHOL) 20 MG tablet    Sig: Take 1 tablet (20 mg total) by mouth at bedtime.    Dispense:  90 tablet    Refill:  1  . spironolactone (ALDACTONE) 25 MG tablet    Sig: Take 1 tablet (25 mg total) by mouth 2 (two) times daily.    Dispense:  180 tablet    Refill:  1   Patient Instructions   Restart diabetes medicine as planned. In the future please follow-up prior to that medication running out so we can better assess control of your medical conditions. Let me know if there are any concerns with follow-up or if you need a temporary refill if unable to return in time.  Recheck in 3 months for diabetes. I will let you know if medication changes are needed once I see your results.  Hemoglobin is  improved today, may need to restart iron as planned after next visit with gastroenterologist. If your fatigue is not improving within the next 2 weeks, return to discuss that further. Sooner or to the emergency room if symptoms are worsening.   Fatigue Fatigue is feeling tired all of the time, a lack of energy, or a lack of motivation. Occasional or mild fatigue is often a normal response to activity or life in general. However, long-lasting (chronic) or extreme fatigue may indicate an underlying medical condition. Follow these instructions at home: Watch your fatigue for any changes. The following actions may help to lessen any discomfort you are feeling:  Talk to your health care provider about how much sleep you need each night. Try to get the required amount every night.  Take medicines only as directed by your health care provider.  Eat a healthy and nutritious diet. Ask your health care provider if you need help changing your diet.  Drink enough fluid to keep your urine clear or pale yellow.  Practice ways of relaxing, such as yoga, meditation, massage therapy, or  acupuncture.  Exercise regularly.  Change situations that cause you stress. Try to keep your work and personal routine reasonable.  Do not abuse illegal drugs.  Limit alcohol intake to no more than 1 drink per day for nonpregnant women and 2 drinks per day for men. One drink equals 12 ounces of beer, 5 ounces of wine, or 1 ounces of hard liquor.  Take a multivitamin, if directed by your health care provider. Contact a health care provider if:  Your fatigue does not get better.  You have a fever.  You have unintentional weight loss or gain.  You have headaches.  You have difficulty:  Falling asleep.  Sleeping throughout the night.  You feel angry, guilty, anxious, or sad.  You are unable to have a bowel movement (constipation).  You skin is dry.  Your legs or another part of your body is swollen. Get  help right away if:  You feel confused.  Your vision is blurry.  You feel faint or pass out.  You have a severe headache.  You have severe abdominal, pelvic, or back pain.  You have chest pain, shortness of breath, or an irregular or fast heartbeat.  You are unable to urinate or you urinate less than normal.  You develop abnormal bleeding, such as bleeding from the rectum, vagina, nose, lungs, or nipples.  You vomit blood.  You have thoughts about harming yourself or committing suicide.  You are worried that you might harm someone else. This information is not intended to replace advice given to you by your health care provider. Make sure you discuss any questions you have with your health care provider. Document Released: 06/03/2007 Document Revised: 01/12/2016 Document Reviewed: 12/08/2013 Elsevier Interactive Patient Education  2017 [employer].    IF you received an x-ray today, you will receive an invoice from [facility]. Please contact [facility] at [phone] with questions or concerns regarding your invoice.   IF you received labwork today, you will receive an invoice from [facility]. Please contact [facility] at [phone] with questions or concerns regarding your invoice.   Our billing staff will not be able to assist you with questions regarding bills from these companies.  You will be contacted with the lab results as soon as they are available. The fastest way to get your results is to activate your My Chart account. Instructions are located on the last page of this paperwork. If you have not heard from [place] regarding the results in 2 weeks, please contact this office.      I personally performed the services described in this documentation, which was scribed in my presence. The recorded information has been reviewed and considered for accuracy and completeness, addended by me as needed, and agree with information above.  Signed,   [name], MD [facility].  01/05/17 5:42 PM

## 2017-01-06 LAB — COMPREHENSIVE METABOLIC PANEL
ALT: 36 IU/L (ref 0–44)
AST: 50 IU/L — ABNORMAL HIGH (ref 0–40)
Albumin/Globulin Ratio: 1.1 — ABNORMAL LOW (ref 1.2–2.2)
Albumin: 4 g/dL (ref 3.5–4.8)
Alkaline Phosphatase: 96 IU/L (ref 39–117)
BUN/Creatinine Ratio: 7 — ABNORMAL LOW (ref 10–24)
BUN: 7 mg/dL — ABNORMAL LOW (ref 8–27)
Bilirubin Total: 0.9 mg/dL (ref 0.0–1.2)
CO2: 21 mmol/L (ref 18–29)
Calcium: 9.6 mg/dL (ref 8.6–10.2)
Chloride: 105 mmol/L (ref 96–106)
Creatinine, Ser: 1.01 mg/dL (ref 0.76–1.27)
GFR calc Af Amer: 86 mL/min/{1.73_m2} (ref >59)
GFR calc non Af Amer: 74 mL/min/{1.73_m2} (ref >59)
Globulin, Total: 3.7 g/dL (ref 1.5–4.5)
Glucose: 154 mg/dL — ABNORMAL HIGH (ref 65–99)
Potassium: 4.2 mmol/L (ref 3.5–5.2)
Sodium: 142 mmol/L (ref 134–144)
Total Protein: 7.7 g/dL (ref 6.0–8.5)

## 2017-01-06 LAB — LIPID PANEL
Chol/HDL Ratio: 2.8 ratio (ref 0.0–5.0)
Cholesterol, Total: 152 mg/dL (ref 100–199)
HDL: 54 mg/dL (ref >39)
LDL Calculated: 79 mg/dL (ref 0–99)
Triglycerides: 93 mg/dL (ref 0–149)
VLDL Cholesterol Cal: 19 mg/dL (ref 5–40)

## 2017-01-06 LAB — HEMOGLOBIN A1C
Est. average glucose Bld gHb Est-mCnc: 209 mg/dL
Hgb A1c MFr Bld: 8.9 % — ABNORMAL HIGH (ref 4.8–5.6)

## 2017-01-07 ENCOUNTER — Ambulatory Visit: Payer: Medicare Other | Admitting: Interventional Cardiology

## 2017-01-09 NOTE — Progress Notes (Signed)
Cardiology Office Note    Date:  01/11/2017   ID:  [name], DOB 1945-09-11, MRN [id number]  PCP:  Wendie Agreste, MD  Cardiologist: Sinclair Grooms, MD   Chief Complaint  Patient presents with  . Coronary Artery Disease    History of Present Illness:  [name] is a 71 y.o. male male hepatocellular cancer, hyperlipidemia, DM II, hypertension, OSA, and possible prior MI.  He was referred by Dr. Watt Climes for evaluation with concern for possible prior myocardial infarction. Myocardial perfusion imaging was normal (please see below). He has no cardiac complaints. Recent laboratory demonstrated that hemoglobin has improved from 6.1 on February 1 to 11.5 on 01/05/2017. He denies orthopnea, PND, and peripheral edema. BUN and creatinine are normal.   Past Medical History:  Diagnosis Date  . Cancer [facility])    hepatocellular cancer   . Diabetes mellitus without complication ([place])   . Hepatitis   . Hyperlipidemia   . Hypertension   . Narcolepsy    per office visit note of 08/2011   . Sleep apnea     Past Surgical History:  Procedure Laterality Date  . LAPAROSCOPY  03/02/2013   Procedure: LAPAROSCOPY DIAGNOSTIC;  Surgeon: [name], MD;  Location: [facility];  Service: General;;  . LIVER ULTRASOUND  03/02/2013   Procedure: LIVER ULTRASOUND;  Surgeon: [name], MD;  Location: [facility];  Service: General;;  . OPEN PARTIAL HEPATECTOMY   03/02/2013   Procedure: OPEN PARTIAL HEPATECTOMY [83];  Surgeon: [name], MD;  Location: [facility];  Service: General;;  DX LAPAROSCOPY, INTRAOPERATIVE LIVER ULTRASOUND, OPEN PARTIAL HEPATECTOMY  . pinched nerve in back      Current Medications: Outpatient Medications Prior to Visit  Medication Sig Dispense Refill  . alfuzosin (UROXATRAL) 10 MG 24 hr tablet Take 10 mg by mouth at bedtime.     [name] Kitchen amphetamine-dextroamphetamine (ADDERALL XR) 30 MG 24 hr capsule Take 1 capsule (30 mg total) by mouth every morning. 90 capsule 0  .  glipiZIDE (GLUCOTROL) 5 MG tablet TAKE 1 TABLET BY MOUTH  TWICE A DAY BEFORE MEALS 180 tablet 1  . losartan (COZAAR) 25 MG tablet Take 1 tablet (25 mg total) by mouth daily. 90 tablet 1  . metFORMIN (GLUCOPHAGE) 1000 MG tablet Take 1 tablet (1,000 mg total) by mouth 2 (two) times daily with a meal. 180 tablet 1  . metoprolol succinate (TOPROL-XL) 50 MG 24 hr tablet Take 1 tablet (50 mg total) by mouth daily. Take with or immediately following a meal. 90 tablet 1  . pravastatin (PRAVACHOL) 20 MG tablet Take 1 tablet (20 mg total) by mouth at bedtime. 90 tablet 1  . sitaGLIPtin (JANUVIA) 100 MG tablet Take 1 tablet (100 mg total) by mouth daily. 90 tablet 1  . spironolactone (ALDACTONE) 25 MG tablet Take 1 tablet (25 mg total) by mouth 2 (two) times daily. 180 tablet 1  . ferrous sulfate 325 (65 FE) MG EC tablet Take 1 tablet (325 mg total) by mouth 3 (three) times daily with meals. (Patient not taking: Reported on 01/11/2017) 90 tablet 0   No facility-administered medications prior to visit.      Allergies:   Ace inhibitors   Social History   Social History  . Marital status: Single    Spouse name: N/A  . Number of children: 2  . Years of education: 15+   Social History Main Topics  . Smoking status: Former Research scientist (life sciences)  . Smokeless tobacco: Never  Used  . Alcohol use No  . Drug use: No  . Sexual activity: Yes   Other Topics Concern  . None   Social History Narrative   Patient is single and lives alone- became widower when 1 child was 74 year old, divorced 2nd marriage   Has #2 grown children, not in area or involved in his life   Patient is retired.   Patient has a college education.   Patient is right-handed.   Patient drinks maybe two sodas daily.     Family History:  The patient's family history includes Cancer in his brother and father; Dementia in his mother; Diabetes in his brother; Hypertension in his brother.   ROS:   Please see the history of present illness.    No  cardiovascular complaints.  All other systems reviewed and are negative.   PHYSICAL EXAM:   VS:  BP 106/62 (BP Location: Left Arm)   Pulse 90   Ht 5\' 9"  (1.753 m)   Wt 210 lb 3.2 oz (95.3 kg)   BMI 31.04 kg/m    GEN: Well nourished, well developed, in no acute distress  HEENT: normal  Neck: no JVD, carotid bruits, or masses Cardiac: RRR; no murmurs, rubs, or gallops,no edema  Respiratory:  clear to auscultation bilaterally, normal work of breathing GI: soft, nontender, nondistended, + BS MS: no deformity or atrophy  Skin: warm and dry, no rash Neuro:  Alert and Oriented x 3, Strength and sensation are intact Psych: euthymic mood, full affect  Wt Readings from Last 3 Encounters:  01/11/17 210 lb 3.2 oz (95.3 kg)  01/05/17 211 lb (95.7 kg)  12/05/16 217 lb (98.4 kg)      Studies/Labs Reviewed:   EKG:  EKG  Not repeated  Recent Labs: 10/01/2016: Platelets 143 01/05/2017: ALT 36; BUN 7; Creatinine, Ser 1.01; Hemoglobin 11.5; Potassium 4.2; Sodium 142   Lipid Panel    Component Value Date/Time   CHOL 152 01/05/2017 1435   TRIG 93 01/05/2017 1435   HDL 54 01/05/2017 1435   CHOLHDL 2.8 01/05/2017 1435   CHOLHDL 2.6 12/14/2015 1337   VLDL 18 12/14/2015 1337   LDLCALC 79 01/05/2017 1435    Additional studies/ records that were reviewed today include:   NUCLEAR STRESS TEST 11/2016: Study Highlights    Nuclear stress EF: 69%.  There was no ST segment deviation noted during stress.  The study is normal.  This is a low risk study.  The left ventricular ejection fraction is hyperdynamic (>65%).      ASSESSMENT:    1. Aortic atherosclerosis ([place])   2. Old MI (myocardial infarction)   3. OSA (obstructive sleep apnea)      PLAN:  In order of problems listed above:  1. No evidence of myocardial ischemia by recent imaging study. EKG shows inferior Q waves and likely represents his "normal" EKG appearance. 2. No evidence of prior infarction by nuclear  scintigraphy. No perfusion abnormalities were noted. No evidence of ischemia. EF 69%. This problem will be removed from the active problem list. 3. Obstructive sleep apnea although he is not currently using continuous positive airway pressure. Have encouraged that he resume therapy and follow-up with his sleep physician.  No immediate follow-up with cardiology as necessary. No significant problems have been identified. Baseline abnormal EKG suggesting prior inferior infarct but no evidence of ischemia scar on nuclear scintigraphy.    Medication Adjustments/Labs and Tests Ordered: Current medicines are reviewed at length with the patient today.  Concerns regarding medicines are outlined above.  Medication changes, Labs and Tests ordered today are listed in the Patient Instructions below. Patient Instructions  Medication Instructions:  None  Labwork: None  Testing/Procedures: None  Follow-Up: Your physician recommends that you schedule a follow-up appointment as needed with Dr. Tamala Julian.    Any Other Special Instructions Will Be Listed Below (If Applicable).     If you need a refill on your cardiac medications before your next appointment, please call your pharmacy.      Signed, Sinclair Grooms, MD  01/11/2017 4:55 PM    [facility] [place], [place], [place]  [address] Phone: [phone]; Fax: [phone]

## 2017-01-10 DIAGNOSIS — E119 Type 2 diabetes mellitus without complications: Secondary | ICD-10-CM | POA: Diagnosis not present

## 2017-01-10 DIAGNOSIS — H40013 Open angle with borderline findings, low risk, bilateral: Secondary | ICD-10-CM | POA: Diagnosis not present

## 2017-01-10 DIAGNOSIS — H35373 Puckering of macula, bilateral: Secondary | ICD-10-CM | POA: Diagnosis not present

## 2017-01-10 DIAGNOSIS — H2513 Age-related nuclear cataract, bilateral: Secondary | ICD-10-CM | POA: Diagnosis not present

## 2017-01-10 LAB — HM DIABETES EYE EXAM

## 2017-01-11 ENCOUNTER — Encounter (INDEPENDENT_AMBULATORY_CARE_PROVIDER_SITE_OTHER): Payer: Self-pay

## 2017-01-11 ENCOUNTER — Ambulatory Visit (INDEPENDENT_AMBULATORY_CARE_PROVIDER_SITE_OTHER): Payer: Medicare Other | Admitting: Interventional Cardiology

## 2017-01-11 ENCOUNTER — Encounter: Payer: Self-pay | Admitting: Interventional Cardiology

## 2017-01-11 VITALS — BP 106/62 | HR 90 | Ht 69.0 in | Wt 210.2 lb

## 2017-01-11 DIAGNOSIS — G4733 Obstructive sleep apnea (adult) (pediatric): Secondary | ICD-10-CM

## 2017-01-11 DIAGNOSIS — I700 Atherosclerosis of aorta: Secondary | ICD-10-CM

## 2017-01-11 DIAGNOSIS — I252 Old myocardial infarction: Secondary | ICD-10-CM | POA: Diagnosis not present

## 2017-01-11 NOTE — Patient Instructions (Signed)
Medication Instructions:  None  Labwork: None  Testing/Procedures: None  Follow-Up: Your physician recommends that you schedule a follow-up appointment as needed with Dr. Smith.    Any Other Special Instructions Will Be Listed Below (If Applicable).     If you need a refill on your cardiac medications before your next appointment, please call your pharmacy.   

## 2017-01-31 ENCOUNTER — Other Ambulatory Visit (HOSPITAL_BASED_OUTPATIENT_CLINIC_OR_DEPARTMENT_OTHER): Payer: Medicare Other

## 2017-01-31 ENCOUNTER — Telehealth: Payer: Self-pay | Admitting: Nurse Practitioner

## 2017-01-31 ENCOUNTER — Ambulatory Visit (HOSPITAL_BASED_OUTPATIENT_CLINIC_OR_DEPARTMENT_OTHER): Payer: Medicare Other | Admitting: Nurse Practitioner

## 2017-01-31 VITALS — BP 124/59 | HR 86 | Temp 97.2°F | Resp 18 | Ht 69.0 in | Wt 212.0 lb

## 2017-01-31 DIAGNOSIS — C220 Liver cell carcinoma: Secondary | ICD-10-CM

## 2017-01-31 DIAGNOSIS — Z8505 Personal history of malignant neoplasm of liver: Secondary | ICD-10-CM | POA: Diagnosis not present

## 2017-01-31 DIAGNOSIS — D696 Thrombocytopenia, unspecified: Secondary | ICD-10-CM | POA: Diagnosis not present

## 2017-01-31 DIAGNOSIS — D509 Iron deficiency anemia, unspecified: Secondary | ICD-10-CM | POA: Diagnosis not present

## 2017-01-31 DIAGNOSIS — D500 Iron deficiency anemia secondary to blood loss (chronic): Secondary | ICD-10-CM

## 2017-01-31 LAB — CBC WITH DIFFERENTIAL/PLATELET
BASO%: 0.4 % (ref 0.0–2.0)
Basophils Absolute: 0 10*3/uL (ref 0.0–0.1)
EOS%: 1 % (ref 0.0–7.0)
Eosinophils Absolute: 0 10*3/uL (ref 0.0–0.5)
HCT: 25.5 % — ABNORMAL LOW (ref 38.4–49.9)
HGB: 8.4 g/dL — ABNORMAL LOW (ref 13.0–17.1)
LYMPH%: 28.2 % (ref 14.0–49.0)
MCH: 31.3 pg (ref 27.2–33.4)
MCHC: 33.1 g/dL (ref 32.0–36.0)
MCV: 94.7 fL (ref 79.3–98.0)
MONO#: 0.2 10*3/uL (ref 0.1–0.9)
MONO%: 7.9 % (ref 0.0–14.0)
NEUT#: 2 10*3/uL (ref 1.5–6.5)
NEUT%: 62.5 % (ref 39.0–75.0)
Platelets: 96 10*3/uL — ABNORMAL LOW (ref 140–400)
RBC: 2.69 10*6/uL — ABNORMAL LOW (ref 4.20–5.82)
RDW: 17.6 % — ABNORMAL HIGH (ref 11.0–14.6)
WBC: 3.1 10*3/uL — ABNORMAL LOW (ref 4.0–10.3)
lymph#: 0.9 10*3/uL (ref 0.9–3.3)

## 2017-01-31 NOTE — Progress Notes (Addendum)
[name]   Diagnosis:  Hepatocellular carcinoma  INTERVAL HISTORY:   [name] returns as scheduled. He notes improvement in his energy level. He is not aware of any bleeding. He reports undergoing a colonoscopy about a month ago. He has occasional abdominal discomfort after eating something "heavy".  Objective:  Vital signs in last 24 hours:  Blood pressure (!) 124/59, pulse 86, temperature 97.2 F (36.2 C), temperature source Oral, resp. rate 18, height 5\' 9"  (1.753 m), weight 212 lb (96.2 kg), SpO2 100 %.    HEENT: No thrush or ulcers. Resp: Lungs clear bilaterally. Cardio: Regular rate and rhythm. GI: Abdomen soft and nontender. No hepatomegaly. Vascular: No leg edema.   Lab Results:  Lab Results  Component Value Date   WBC 3.1 (L) 01/31/2017   HGB 8.4 (L) 01/31/2017   HCT 25.5 (L) 01/31/2017   MCV 94.7 01/31/2017   PLT 96 (L) 01/31/2017   NEUTROABS 2.0 01/31/2017    Imaging:  No results found.  Medications: I have reviewed the patient's current medications.  Assessment/Plan: 1. Hepatocellular carcinoma, stage I (T1 NX) status post partial left hepatectomy 03/02/2013.  08/31/2013 AFP 2.4.   CT abdomen 08/31/2013 with postoperative changes of partial hepatectomy with small postoperative fluid collection along the resection margin. No definite signs to suggest residual or locally recurrent disease. Several borderline enlarged and minimally enlarged lymph nodes superior to the liver in the juxtapericardiac fat of the lower anterior mediastinum with the largest measuring 11 mm slightly increased compared to the prior study 01/15/2013. Several prominent non-pathologically enlarged upper abdominal ligament lymph nodes similar to the prior study. Small esophageal varices.   CT abdomen 03/03/2012 without evidence of recurrent hepatocellular carcinoma, stable right lung base nodule  CT the abdomen and pelvis 09/06/2014  without evidence of recurrent hepatocellular carcinoma, slight enlargement of pre-cardiac lymph nodes  CT abdomen/pelvis 09/07/2015 with no findings for recurrent tumor or metastatic disease. Stable small 3 mm right middle lobe pulmonary nodule since 2014. Epicardial lymph node measured 11 mm, previously 13 mm. Stable cirrhotic changes. Fairly significant esophageal varices.  CT 09/03/2016-negative for recurrent hepatocellular carcinoma, changes of cirrhosis with varices 2. Cirrhosis. 3. Hepatitis B core antibody positive. 4. Diabetes. 5. Severe microcytic anemia-likely iron deficiency anemia 6. Colonoscopy 12/27/2016-external and internal hemorrhoids. Diverticulosis in the sigmoid, descending and transverse colon. One medium polyp in the distal descending colon. 3 small polyps in the transverse colon. 2 diminutive polyps in the rectum and the proximal ascending colon. Medium-sized lipoma transverse colon. Small lipoma mid ascending colon. Multiple nonbleeding colonic angiodysplastic lesions. Pathology-tubular adenomas, hyperplastic polyps.   Disposition: [name] remains in clinical remission from hepatocellular carcinoma.  He was found to have a severe microcytic anemia on labs 10/01/2016. This was felt to possibly be due to bleeding from varices. He was started on oral iron. He recently had a colonoscopy. He may need an upper endoscopy as well. Hemoglobin has improved on the oral iron. He will continue the same.  He is noted to have mild thrombocytopenia on labs today. This may be related to cirrhosis. We will continue to follow.  He will return for a CBC in one month and a follow-up visit in 2 months. He will contact the office in the interim with any problems.  Patient seen with Dr. Benay Spice.    Ned Card ANP/GNP-BC   01/31/2017  11:59 AM  This was a shared visit with Ned Card. [name] has persistent anemia and mild  thrombocytopenia. He has cirrhosis. We will ask  Dr. Watt Climes to consider an upper endoscopy to look for evidence of bleeding varices or another source of GI blood loss.  Julieanne Manson, M.D.

## 2017-01-31 NOTE — Telephone Encounter (Signed)
Scheduled appt per 6/14 los - Gave patient AVS and calender.  

## 2017-02-01 ENCOUNTER — Other Ambulatory Visit: Payer: Self-pay | Admitting: Nurse Practitioner

## 2017-02-01 DIAGNOSIS — D649 Anemia, unspecified: Secondary | ICD-10-CM

## 2017-02-01 DIAGNOSIS — C220 Liver cell carcinoma: Secondary | ICD-10-CM

## 2017-02-05 ENCOUNTER — Ambulatory Visit: Payer: Medicare Other | Admitting: Neurology

## 2017-02-08 ENCOUNTER — Ambulatory Visit (INDEPENDENT_AMBULATORY_CARE_PROVIDER_SITE_OTHER): Payer: Medicare Other | Admitting: Family Medicine

## 2017-02-08 ENCOUNTER — Encounter: Payer: Self-pay | Admitting: Family Medicine

## 2017-02-08 VITALS — BP 105/68 | HR 75 | Temp 98.0°F | Resp 18 | Ht 68.5 in | Wt 210.0 lb

## 2017-02-08 DIAGNOSIS — E1165 Type 2 diabetes mellitus with hyperglycemia: Secondary | ICD-10-CM | POA: Diagnosis not present

## 2017-02-08 DIAGNOSIS — E119 Type 2 diabetes mellitus without complications: Secondary | ICD-10-CM | POA: Diagnosis not present

## 2017-02-08 MED ORDER — DULAGLUTIDE 0.75 MG/0.5ML ~~LOC~~ SOAJ: 0.7500 mg | SUBCUTANEOUS | 3 refills | Status: DC

## 2017-02-08 MED ORDER — GLIPIZIDE 10 MG PO TABS: ORAL_TABLET | ORAL | 0 refills | Status: DC

## 2017-02-08 NOTE — Progress Notes (Signed)
Subjective:  By signing my name below, I, [name], attest that this documentation has been prepared under the direction and in the presence of Wendie Agreste, MD Electronically Signed: Ladene Artist, ED Scribe 02/08/2017 at 11:06 AM.   Patient ID: [name], male    DOB: 1946/08/16, 71 y.o.   MRN: [id number]  Chief Complaint  Patient presents with  . Diabetes    follow up    HPI JACORION KLEM is a 71 y.o. male who presents to Primary Care at [facility] for a diabetes follow-up. When last seen on 5/19, he was out of medicine for 5 days. He was restarted on Januvia 100 mg qd, Glipizide 5 mg bid, Metformin 1000 mg bid.  Lab Results  Component Value Date   HGBA1C 8.9 (H) 01/05/2017   Lab Results  Component Value Date   MICROALBUR 0.8 04/19/2016  He was advised to return in a few weeks with home readings to decide on medication changes. Previous A1C 7.9 in August 2017. Last creatinine was normal at 1.01.   Pt states that he has been unable to start Januvia due to finances. States he has been checking his blood glucose at home with some readings in the 200s. Denies low readings, dizziness, light-headedness, fatigue, any other side-effects. Denies h/o pancreatitis, family h/o thyroid or endocrine CA.  Patient Active Problem List   Diagnosis Date Noted  . Aortic atherosclerosis ([place]) 11/27/2016  . 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 Altered mental status 03/05/2013  . Respiratory failure, post-operative ([place]) 03/02/2013  . Hepatitis B 02/24/2013  . Cancer, hepatocellular ([place]) 02/10/2013  . Liver tumor-bleeding 01/15/2013  . Epidermal cyst 06/18/2012  . OSA (obstructive sleep apnea) 03/13/2012  . Abnormal leg movement 12/12/2011  . Blood pressure elevated 09/06/2011  . Diabetes mellitus ([place]) 09/06/2011  . 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 BPH (benign prostatic hyperplasia) 09/06/2011  . Diverticula of colon 09/06/2011   Past Medical History:  Diagnosis Date    . Cancer [facility])    hepatocellular cancer   . Diabetes mellitus without complication ([place])   . Hepatitis   . Hyperlipidemia   . Hypertension   . Narcolepsy    per office visit note of 08/2011   . Sleep apnea    Past Surgical History:  Procedure Laterality Date  . LAPAROSCOPY  03/02/2013   Procedure: LAPAROSCOPY DIAGNOSTIC;  Surgeon: [name], MD;  [facility];  Service: General;;  . LIVER ULTRASOUND  03/02/2013   Procedure: LIVER ULTRASOUND;  Surgeon: [name], MD;  [facility]: [facility];  Service: General;;  . OPEN PARTIAL HEPATECTOMY   03/02/2013   Procedure: OPEN PARTIAL HEPATECTOMY [83];  Surgeon: [name], MD;  [facility];  Service: General;;  DX LAPAROSCOPY, INTRAOPERATIVE LIVER ULTRASOUND, OPEN PARTIAL HEPATECTOMY  . pinched nerve in back     Allergies  Allergen Reactions  . Ace Inhibitors Swelling    Angioedema - face.    Prior to Admission medications   Medication Sig Start Date End Date Taking? Authorizing Provider  alfuzosin (UROXATRAL) 10 MG 24 hr tablet Take 10 mg by mouth at bedtime.  12/19/12  Yes [provider]  amphetamine-dextroamphetamine (ADDERALL XR) 30 MG 24 hr capsule Take 1 capsule (30 mg total) by mouth every morning. 07/09/16  Yes Dohmeier, Asencion Partridge, MD  ferrous sulfate (KP FERROUS SULFATE) 325 (65 FE) MG tablet Take 325 mg by mouth 2 (two) times daily with a meal.   Yes [provider]  glipiZIDE (GLUCOTROL) 5  MG tablet TAKE 1 TABLET BY MOUTH  TWICE A DAY BEFORE MEALS 01/05/17  Yes Wendie Agreste, MD  losartan (COZAAR) 25 MG tablet Take 1 tablet (25 mg total) by mouth daily. 01/05/17  Yes Wendie Agreste, MD  metFORMIN (GLUCOPHAGE) 1000 MG tablet Take 1 tablet (1,000 mg total) by mouth 2 (two) times daily with a meal. 01/05/17  Yes Wendie Agreste, MD  metoprolol succinate (TOPROL-XL) 50 MG 24 hr tablet Take 1 tablet (50 mg total) by mouth daily. Take with or immediately following a meal. 01/05/17  Yes Wendie Agreste, MD   pravastatin (PRAVACHOL) 20 MG tablet Take 1 tablet (20 mg total) by mouth at bedtime. 01/05/17  Yes Wendie Agreste, MD  sitaGLIPtin (JANUVIA) 100 MG tablet Take 1 tablet (100 mg total) by mouth daily. 01/05/17  Yes Wendie Agreste, MD  spironolactone (ALDACTONE) 25 MG tablet Take 1 tablet (25 mg total) by mouth 2 (two) times daily. 01/05/17  Yes Wendie Agreste, MD   Social History   Social History  . Marital status: Single    Spouse name: N/A  . Number of children: 2  . Years of education: 15+   Occupational History  . Not on file.   Social History Main Topics  . Smoking status: Former Research scientist (life sciences)  . Smokeless tobacco: Never Used  . Alcohol use No  . Drug use: No  . Sexual activity: Yes   Other Topics Concern  . Not on file   Social History Narrative   Patient is single and lives alone- became widower when 1 child was 12 year old, divorced 2nd marriage   Has #2 grown children, not in area or involved in his life   Patient is retired.   Patient has a college education.   Patient is right-handed.   Patient drinks maybe two sodas daily.   Review of Systems  Constitutional: Negative for fatigue.  Neurological: Negative for dizziness and light-headedness.      Objective:   Physical Exam  Constitutional: He is oriented to person, place, and time. He appears well-developed and well-nourished. No distress.  HENT:  Head: Normocephalic and atraumatic.  Eyes: Conjunctivae and EOM are normal.  Neck: Neck supple. No tracheal deviation present.  Cardiovascular: Normal rate.   Pulmonary/Chest: Effort normal. No respiratory distress.  Musculoskeletal: Normal range of motion.  Neurological: He is alert and oriented to person, place, and time.  Skin: Skin is warm and dry.  Psychiatric: He has a normal mood and affect. His behavior is normal.  Nursing note and vitals reviewed.  Vitals:   02/08/17 1035  BP: 105/68  Pulse: 75  Resp: 18  Temp: 98 F (36.7 C)  TempSrc: Oral   SpO2: 98%  Weight: 210 lb (95.3 kg)  Height: 5' 8.5" (1.74 m)      Assessment & Plan:   PANAYIOTIS RAINVILLE is a 71 y.o. male Type 2 diabetes mellitus with hyperglycemia, without long-term current use of insulin ([place]) -  - Januvia has been cost prohibitive. Has continued 5 mg twice a day and glipizide and metformin. Initially thought to add Trulicity,  but will try to maximize glipizide first as that has been more affordable. Hypoglycemic precautions.   - Stressed importance of diet, weight loss with this plan to achieve improved A1c. Recheck A1c in 3 months, consider GLP-1 or SGLT-2 addition at that time if needed.   - recheck A1c in 3 months.  Meds ordered this encounter  Medications  .  DISCONTD: Dulaglutide (TRULICITY) 3.24 MW/1.0UV SOPN    Sig: Inject 0.75 mg into the skin once a week.    Dispense:  2 mL    Refill:  3  . glipiZIDE (GLUCOTROL) 10 MG tablet    Sig: TAKE 1 TABLET BY MOUTH  TWICE A DAY BEFORE MEALS    Dispense:  180 tablet    Refill:  0   Patient Instructions    Unfortunately with Simeon Craft, we can stop that medication at this point. Increase glipizide to 10 mg twice per day.  Continue metformin 1000 mg twice per day. Watch for low blood sugar symptoms as we discussed. This may not be strong enough medication by itself, so watch diet, watch portions, and try to work on weight loss in the next 3 months.   Recheck for repeat hemoglobin A1c in 3 months. If A1c is still above 7 at that time, we can add a medicine like Trulicity that was discussed today.    IF you received an x-ray today, you will receive an invoice from [facility]. Please contact [facility] at [phone] with questions or concerns regarding your invoice.   IF you received labwork today, you will receive an invoice from [facility]. Please contact [facility] at [phone] with questions or concerns regarding your invoice.   Our billing staff will not be able to assist you with  questions regarding bills from these companies.  You will be contacted with the lab results as soon as they are available. The fastest way to get your results is to activate your My Chart account. Instructions are located on the last page of this paperwork. If you have not heard from [place] regarding the results in 2 weeks, please contact this office.       I personally performed the services described in this documentation, which was scribed in my presence. The recorded information has been reviewed and considered for accuracy and completeness, addended by me as needed, and agree with information above.  Signed,   [name], MD [facility] at [facility].  02/08/17 11:25 AM

## 2017-02-08 NOTE — Patient Instructions (Addendum)
  Unfortunately with [name], we can stop that medication at this point. Increase glipizide to 10 mg twice per day.  Continue metformin 1000 mg twice per day. Watch for low blood sugar symptoms as we discussed. This may not be strong enough medication by itself, so watch diet, watch portions, and try to work on weight loss in the next 3 months.   Recheck for repeat hemoglobin A1c in 3 months. If A1c is still above 7 at that time, we can add a medicine like Trulicity that was discussed today.    IF you received an x-ray today, you will receive an invoice from [facility]. Please contact [facility] at [phone] with questions or concerns regarding your invoice.   IF you received labwork today, you will receive an invoice from [place]. Please contact [facility] at [phone] with questions or concerns regarding your invoice.   Our billing staff will not be able to assist you with questions regarding bills from these companies.  You will be contacted with the lab results as soon as they are available. The fastest way to get your results is to activate your My Chart account. Instructions are located on the last page of this paperwork. If you have not heard from [place] regarding the results in 2 weeks, please contact this office.

## 2017-02-09 ENCOUNTER — Telehealth: Payer: Self-pay | Admitting: Family Medicine

## 2017-02-09 NOTE — Telephone Encounter (Signed)
Pt stated that one of his prescription he received at his pharmacy was expensive and that he needs another prescription that  He can afford..  pt didn't know the name of medication Please advise: [phone]

## 2017-02-12 NOTE — Telephone Encounter (Signed)
Pt is coming in to check on the replacement of the medication that was too expensive (might be glipizide). Pt still uses the [facility].  Please advise [phone]

## 2017-02-12 NOTE — Telephone Encounter (Signed)
FYI ....does the patient need to come in earlier ?

## 2017-02-13 NOTE — Telephone Encounter (Signed)
See last visit. He was continued on metformin and increased the glipizide, but that should be generic. Please clarify that he did not try to fill a different medication. More information is needed.

## 2017-02-21 NOTE — Telephone Encounter (Signed)
Attempted to call x2. Pt hung up the phone both times.

## 2017-02-26 ENCOUNTER — Encounter: Payer: Self-pay | Admitting: Neurology

## 2017-02-26 ENCOUNTER — Ambulatory Visit (INDEPENDENT_AMBULATORY_CARE_PROVIDER_SITE_OTHER): Payer: Medicare Other | Admitting: Neurology

## 2017-02-26 DIAGNOSIS — G4733 Obstructive sleep apnea (adult) (pediatric): Secondary | ICD-10-CM | POA: Diagnosis not present

## 2017-02-26 DIAGNOSIS — G47411 Narcolepsy with cataplexy: Secondary | ICD-10-CM

## 2017-02-26 MED ORDER — AMPHETAMINE-DEXTROAMPHET ER 30 MG PO CP24: 30.0000 mg | ORAL_CAPSULE | Freq: Every morning | ORAL | 0 refills | Status: DC

## 2017-02-26 NOTE — Progress Notes (Addendum)
[facility]   Provider:    , M D  Primary Care Physician:  Greene, Jeffrey R, MD   Referring Provider: Greene, Jeffrey R, MD  Chief Complaint  Patient presents with  . Narcolepsy    He is here for his six month follow up.  He has continued taking Adderall XR 30mg each morning, which is helpful.  . Sleep Apnea    He is no longer using CPAP therapy.    HPI:  [name] is a 71 y.o. male , seen here as in a revisit . PATIENT: [name] DOB: 09/20/1945  REASON FOR VISIT: follow up- narcolepsy, obstructive sleep apnea HISTORY FROM: patient.  Interval history from 02/26/2017. This is the first visit with [name] since June 2016, in the meantime he has seen my nurse practitioner times three. [name] is no longer using CPAP as he don't longer owns a machine. The sleep apnea is currently untreated, but I have no doubt that he still suffers from apnea and that he still snores. He had excessive daytime sleepiness which had been treated with Adderall. Xyrem also became unaffordable to the patient. He had tolerated Xyrem well for almost 8 years until he had to discontinue the medicine. He endorses the Epworth sleepiness score today at 16 points and the fatigue severity score at 30 points. It is clear that he is still excessively daytime sleepy but his only tool at this time is to use Adderall. I would like to retest him for sleep apnea.  The patient is now covered with [employer] in a Medicare contract. It should be possible for us to obtain a home sleep test and re-issue CPAP possibly with an auto titrator.  I have also reviewed [name]'s recent medical history, he had a fall in March of this year which led to some facial laceration. He denies having had cataplectic symptoms that led to the fall, he had stumbled.  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 Bruce Sherrill.  This tumor may cause additional fatigue and sleep issues.  He feels his memory is slower, not amnestic.     HISTORY OF PRESENT ILLNESS: Today 07/30/2016: [name] is a 70-year-old male with a history of narcolepsy and obstructive sleep apnea. He returns today for follow-up. The patient reports that he is back on Adderall extended release and is doing well. He denies any significant daytime sleepiness. He is able to complete all ADLs independently. He operates a motor vehicle without difficulty although he reports he does not drive often. He does state recently he has noticed he is forgetful. States that he can be in a conversation and forget what he was going to say however he is able to recall it eventually. He does not notice any other behavior changes associated with his memory. He states that family nor friends and make any mention of his forgetfulness. The patient does have sleep apnea that is currently not treated because he was unable to afford the CPAP machine. He returns today for an evaluation.    June 12th 2017: [name] is a 80-year-old male with a history of narcolepsy and obstructive sleep apnea. He returns today for follow-up. The patient reports that he has not been taking Adderall extended release due to the cost of the medication. Of course since he has not been on the medication his daytime sleepiness has gotten worse. He states when he was taking the   medication regularly he noted great improvement in his sleepiness. In the past he's been on Xyrem but also had to stop this medication due to the cost. The patient was also diagnosed with sleep apnea but could never find a mask that was comfortable to use. The patient states that he is able to complete all ADLs independently. He is retired. He does not operate a motor vehicle. He returns today for an evaluation.  HISTORY 08/01/15: [name] is a 69 -year-old male with a history of narcolepsy and obstructive sleep apnea. He  returns today for follow-up. The patient is currently only taking Adderall extended release for his narcolepsy. In the past he has been on Xyrem but due to the price he discontinued this medication. He also states today that he is very nervous about Xyrem. He has no desire to restart this medication. The patient feels that his narcolepsy is controlled well enough with Adderall. He no longer uses his CPAP. The patient's Epworth sleepiness score is 13 and fatigue severity score is 36. The patient is currently pleased with Adderall does not want to make any adjustments to his medications. He denies any new neurological symptoms. He returns today for an evaluation.  HISTORY 01/31/15 (): [name] is a very pleasant 71 y.o.-year-old Right-handed male, with an underlying medical history of hypertension, hyperlipidemia, diabetes, who presents for followup consultation of his narcolepsy as well as sleep apnea.   01-31-15  The patient is unaccompanied today. I am seeing this patient For over 10 years now and treat his hypersomnia with adderall and XYREM, He discontinued XYREM after the drug's price escalated , I had have seen him last patient 06/11/2012 at which time he indicated that he no longer uses a CPAP machine.  He has sleep study in 2012 which showed moderate obstructive sleep apnea and was titrated on CPAP of 9 cm of water pressure. He had residual sleep apnea findings without pressure and she increased his pressure to 10 cm which improved his residual AHI. While he was compliant with CPAP at the time he had problems with co-pays and indicated that he could not afford CPAP.  At some point he returned a CPAP machine to his home health company. He is willing to get restarted on treatment. He has a history of narcolepsy and indicates no recent cataplectic attacks. He was tried on Provigil which did not help sleepiness. He was last on Xyrem 4.5 g and 3 g each night In November 2015 ,  and he continued on Adderall XR 30 mg daily. He feels more sleepy since Xyrem stopped. Based on the recent liver surgery will need to review LFT every 3 month if patient stays on XYREM. He has Insomnia now, on XR adderall, which he never had before. I will refill medications and he does require any refills today. He indicates no new problems. He feels that his adderall only medication regimen is working "good enough ".    Sleep habits are as follows: bedtime varies between 10 and 11 pm and he is usually he is asleep promptly.  He sleeps well, but wakes twice for bathroom breaks. Sleeping through otherwise, he rises at 6.30 AM, wakes up spontaneously. He naps in daytime, almost daily. He naps after dinner, too.   Sleep medical history and family sleep history:  one other family member with narcolepsy, niece on brother's  side. Liver surgery in 2016, symptoms of liver swelling, fatigue, cramping.   Social history:  Retired, disabled, no tobacco   use, no ETOH, caffeine - tea twice a day.no coffee, soda twice week.  .   Review of Systems: Out of a complete 14 system review, the patient complains of only the following symptoms, and all other reviewed systems are negative.  sleepiness.    Epworth score 16 , Fatigue severity score 30  , depression score n/a    Social History   Social History  . Marital status: Single    Spouse name: N/A  . Number of children: 2  . Years of education: 15+   Occupational History  . Not on file.   Social History Main Topics  . Smoking status: Former Smoker  . Smokeless tobacco: Never Used  . Alcohol use No  . Drug use: No  . Sexual activity: Yes   Other Topics Concern  . Not on file   Social History Narrative   Patient is single and lives alone- became widower when 1 child was 1 year old, divorced 2nd marriage   Has #2 grown children, not in area or involved in his life   Patient is retired.   Patient has a college education.   Patient is  right-handed.   Patient drinks maybe two sodas daily.    Family History  Problem Relation Age of Onset  . Dementia Mother   . Cancer Father   . Diabetes Brother   . Hypertension Brother   . Cancer Brother     Past Medical History:  Diagnosis Date  . Cancer (HCC)    hepatocellular cancer   . Diabetes mellitus without complication (HCC)   . Hepatitis   . Hyperlipidemia   . Hypertension   . Narcolepsy    per office visit note of 08/2011   . Sleep apnea     Past Surgical History:  Procedure Laterality Date  . LAPAROSCOPY  03/02/2013   Procedure: LAPAROSCOPY DIAGNOSTIC;  Surgeon: Faera Byerly, MD;  Location: [facility];  Service: General;;  . LIVER ULTRASOUND  03/02/2013   Procedure: LIVER ULTRASOUND;  Surgeon: Faera Byerly, MD;  Location: [facility];  Service: General;;  . OPEN PARTIAL HEPATECTOMY   03/02/2013   Procedure: OPEN PARTIAL HEPATECTOMY [83];  Surgeon: Faera Byerly, MD;  Location: [facility];  Service: General;;  DX LAPAROSCOPY, INTRAOPERATIVE LIVER ULTRASOUND, OPEN PARTIAL HEPATECTOMY  . pinched nerve in back      Current Outpatient Prescriptions  Medication Sig Dispense Refill  . alfuzosin (UROXATRAL) 10 MG 24 hr tablet Take 10 mg by mouth at bedtime.     . amphetamine-dextroamphetamine (ADDERALL XR) 30 MG 24 hr capsule Take 1 capsule (30 mg total) by mouth every morning. 90 capsule 0  . ferrous sulfate (KP FERROUS SULFATE) 325 (65 FE) MG tablet Take 325 mg by mouth 2 (two) times daily with a meal.    . glipiZIDE (GLUCOTROL) 10 MG tablet TAKE 1 TABLET BY MOUTH  TWICE A DAY BEFORE MEALS 180 tablet 0  . losartan (COZAAR) 25 MG tablet Take 1 tablet (25 mg total) by mouth daily. 90 tablet 1  . metFORMIN (GLUCOPHAGE) 1000 MG tablet Take 1 tablet (1,000 mg total) by mouth 2 (two) times daily with a meal. 180 tablet 1  . metoprolol succinate (TOPROL-XL) 50 MG 24 hr tablet Take 1 tablet (50 mg total) by mouth daily. Take with or immediately following a meal. 90 tablet 1  . pravastatin  (PRAVACHOL) 20 MG tablet Take 1 tablet (20 mg total) by mouth at bedtime. 90 tablet 1  . sitaGLIPtin (JANUVIA) 100   MG tablet Take 1 tablet (100 mg total) by mouth daily. 90 tablet 1  . spironolactone (ALDACTONE) 25 MG tablet Take 1 tablet (25 mg total) by mouth 2 (two) times daily. 180 tablet 1   No current facility-administered medications for this visit.     Allergies as of 02/26/2017 - Review Complete 02/26/2017  Allergen Reaction Noted  . Ace inhibitors Swelling 02/19/2012    Vitals: BP 139/77   Pulse 75   Ht 5' 8.5" (1.74 m)   Wt 214 lb (97.1 kg)   BMI 32.07 kg/m  Last Weight:  Wt Readings from Last 1 Encounters:  02/26/17 214 lb (97.1 kg)   BMI:Body mass index is 32.07 kg/m.     Last Height:   Ht Readings from Last 1 Encounters:  02/26/17 5' 8.5" (1.74 m)    Physical exam:  General: The patient is awake, alert and appears not in acute distress. The patient is well groomed. Head: Normocephalic, atraumatic. Neck is supple. Mallampati 5, poor dentition.  neck circumference: 17.25 . Nasal airflow congestion , Retrognathia is seen.  Cardiovascular:  Regular rate and rhythm - NSR regular , without  murmurs or carotid bruit, and without distended neck veins. Respiratory: Lungs are clear to auscultation. Skin:  Without evidence of edema, or rash Trunk: BMI is 32  The patient's posture is slumped   Neurologic exam : The patient is awake and alert, oriented to place and time.   Memory subjective  described as impaired- Memory delay is described.  Attention span & concentration ability appears limited. Speech is fluent,  without  dysarthria, but dysphonia. Mood and affect are appropriate.  Cranial nerves: Pupils are equal and briskly reactive to light.  Extraocular movements  in vertical and horizontal planes intact and without nystagmus. Visual fields by finger perimetry are intact. Hearing to finger rub intact.  Facial sensation intact to fine touch. Facial motor strength is  symmetric and tongue and uvula move midline. Shoulder shrug was symmetrical.  Motor exam: Normal tone, muscle bulk and symmetric strength in all extremities. He reports pain in the left leg- not steady but paroxysmal. Had fallen on his left hip.  Coordination:  Finger-to-nose maneuver  normal without evidence of ataxia, dysmetria or tremor.  Gait and station: Patient walks without assistive device and is able unassisted to climb up to the exam table. Strength within normal limits.  Stance is stable and normal. Turns with  4 Steps.  Deep tendon reflexes: in the  upper and lower extremities are symmetric and intact.    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   2) OSA, untreated - needs re evaluation. HST ordered.   3) recent fall in May , left hip pain. Right knee pain. Arthritis.   4) some shot term memory blips- he describes memory is delayed - not amnestic.    The patient was advised of the nature of the diagnosed disorder , the treatment options and the  risks for general health and wellness arising from not treating the condition.   I spent more than 50 minutes of face to face time with the patient.  Greater than 50% of time was spent in counseling and coordination of care. We   have discussed the diagnosis and differential and I answered the patient's questions.    Plan:  Treatment plan and additional workup :  HST ordered. Goal is to restart CPAP with good compliance.  Adderall refilled.  Narcolepsy HLA draw ordered.  Rv with NP in 3 month , MMSE- MOCA added.      , MD 02/26/2017, 2:18 PM  Certified in Neurology by ABPN Certified in Sleep  Medicine by ABSM  [facility] [address], [address] [place], [place] [address]           

## 2017-02-26 NOTE — Patient Instructions (Signed)
You have been diagnosed with a  condition called narcolepsy:  This means, that you have a sleep disorder that manifests with at times severe excessive sleepiness during the day and often with problems with sleep at night. We may have to try different medications that may help you stay awake during the day. Not everything works with everybody the same way. Wake promoting agents include stimulants and non-stimulant type medications. The most common side effects with stimulants are weight loss, insomnia, nervousness, headaches, palpitations, rise in blood pressure, anxiety. Stimulants can be addictive and subject to abuse. Non-stimulant type wake promoting medications include Provigil and Nuvigil, most common side effects include headaches, nervousness, insomnia, hypertension. In addition there is a medication called Xyrem which has been proven to be very effective in patients with narcolepsy with or without cataplexy. Some patients with narcolepsy report episodes of weakness, such as jaw or facial weakness, legs giving out, feeling wobbly or like "Jell-o", etc. in situations of anxiety, stress, laughter, sudden sadness, surprise, etc., which is called cataplexy. You can also experience episodes of sleep paralysis during which you may feel unable to move upon awakening. Some people experience dreamlike sequences upon awakening or upon drifting off to sleep, called hypnopompic or hypnagogic hallucinations.

## 2017-02-27 ENCOUNTER — Ambulatory Visit (HOSPITAL_BASED_OUTPATIENT_CLINIC_OR_DEPARTMENT_OTHER): Payer: Medicare Other

## 2017-02-27 DIAGNOSIS — C220 Liver cell carcinoma: Secondary | ICD-10-CM

## 2017-02-27 LAB — CBC WITH DIFFERENTIAL/PLATELET
BASO%: 0.5 % (ref 0.0–2.0)
Basophils Absolute: 0 10*3/uL (ref 0.0–0.1)
EOS%: 1.1 % (ref 0.0–7.0)
Eosinophils Absolute: 0 10*3/uL (ref 0.0–0.5)
HCT: 28.2 % — ABNORMAL LOW (ref 38.4–49.9)
HGB: 9.3 g/dL — ABNORMAL LOW (ref 13.0–17.1)
LYMPH%: 24.9 % (ref 14.0–49.0)
MCH: 33 pg (ref 27.2–33.4)
MCHC: 33 g/dL (ref 32.0–36.0)
MCV: 100.1 fL — ABNORMAL HIGH (ref 79.3–98.0)
MONO#: 0.3 10*3/uL (ref 0.1–0.9)
MONO%: 9.8 % (ref 0.0–14.0)
NEUT#: 1.8 10*3/uL (ref 1.5–6.5)
NEUT%: 63.7 % (ref 39.0–75.0)
Platelets: 117 10*3/uL — ABNORMAL LOW (ref 140–400)
RBC: 2.82 10*6/uL — ABNORMAL LOW (ref 4.20–5.82)
RDW: 17.3 % — ABNORMAL HIGH (ref 11.0–14.6)
WBC: 2.8 10*3/uL — ABNORMAL LOW (ref 4.0–10.3)
lymph#: 0.7 10*3/uL — ABNORMAL LOW (ref 0.9–3.3)

## 2017-02-28 ENCOUNTER — Other Ambulatory Visit: Payer: Medicare Other

## 2017-02-28 LAB — AFP TUMOR MARKER: AFP, Serum, Tumor Marker: 3.3 ng/mL (ref 0.0–8.3)

## 2017-03-28 ENCOUNTER — Telehealth: Payer: Self-pay | Admitting: Oncology

## 2017-03-28 ENCOUNTER — Ambulatory Visit (HOSPITAL_BASED_OUTPATIENT_CLINIC_OR_DEPARTMENT_OTHER): Payer: Medicare Other | Admitting: Oncology

## 2017-03-28 ENCOUNTER — Other Ambulatory Visit (HOSPITAL_BASED_OUTPATIENT_CLINIC_OR_DEPARTMENT_OTHER): Payer: Medicare Other

## 2017-03-28 VITALS — BP 146/74 | HR 80 | Temp 97.9°F | Resp 18 | Ht 68.5 in | Wt 212.3 lb

## 2017-03-28 DIAGNOSIS — C220 Liver cell carcinoma: Secondary | ICD-10-CM

## 2017-03-28 DIAGNOSIS — Z8505 Personal history of malignant neoplasm of liver: Secondary | ICD-10-CM | POA: Diagnosis not present

## 2017-03-28 DIAGNOSIS — D649 Anemia, unspecified: Secondary | ICD-10-CM

## 2017-03-28 LAB — CBC WITH DIFFERENTIAL/PLATELET
BASO%: 0.6 % (ref 0.0–2.0)
Basophils Absolute: 0 10*3/uL (ref 0.0–0.1)
EOS%: 1.2 % (ref 0.0–7.0)
Eosinophils Absolute: 0 10*3/uL (ref 0.0–0.5)
HCT: 31.6 % — ABNORMAL LOW (ref 38.4–49.9)
HGB: 10.4 g/dL — ABNORMAL LOW (ref 13.0–17.1)
LYMPH%: 24.4 % (ref 14.0–49.0)
MCH: 32.8 pg (ref 27.2–33.4)
MCHC: 33 g/dL (ref 32.0–36.0)
MCV: 99.4 fL — ABNORMAL HIGH (ref 79.3–98.0)
MONO#: 0.3 10*3/uL (ref 0.1–0.9)
MONO%: 10 % (ref 0.0–14.0)
NEUT#: 1.9 10*3/uL (ref 1.5–6.5)
NEUT%: 63.8 % (ref 39.0–75.0)
Platelets: 117 10*3/uL — ABNORMAL LOW (ref 140–400)
RBC: 3.18 10*6/uL — ABNORMAL LOW (ref 4.20–5.82)
RDW: 14.4 % (ref 11.0–14.6)
WBC: 3 10*3/uL — ABNORMAL LOW (ref 4.0–10.3)
lymph#: 0.7 10*3/uL — ABNORMAL LOW (ref 0.9–3.3)

## 2017-03-28 NOTE — Telephone Encounter (Signed)
Gave patient avs and calendars.  °

## 2017-03-28 NOTE — Progress Notes (Signed)
  [place] OFFICE PROGRESS NOTE   Diagnosis: Hepatocellular carcinoma, cirrhosis  INTERVAL HISTORY:   [name] returns as scheduled. He is taking iron. No bleeding. He reports a variable appetite. No dysphagia. He is undergoing evaluation for narcolepsy and sleep apnea.  Objective:  Vital signs in last 24 hours:  Blood pressure (!) 146/74, pulse 80, temperature 97.9 F (36.6 C), temperature source Oral, resp. rate 18, height 5' 8.5" (1.74 m), weight 212 lb 4.8 oz (96.3 kg), SpO2 99 %.    HEENT: Neck without mass Lymphatics: No cervical, supraclavicular, or axillary nodes Resp: Lungs clear bilaterally Cardio: Regular rate and rhythm GI: No hepatosplenomegaly, no apparent ascites Vascular: Trace ankle edema bilaterally   Lab Results:  Lab Results  Component Value Date   WBC 3.0 (L) 03/28/2017   HGB 10.4 (L) 03/28/2017   HCT 31.6 (L) 03/28/2017   MCV 99.4 (H) 03/28/2017   PLT 117 (L) 03/28/2017   NEUTROABS 1.9 03/28/2017    Medications: I have reviewed the patient's current medications.  Assessment/Plan: 1. Hepatocellular carcinoma, stage I (T1 NX) status post partial left hepatectomy 03/02/2013.  08/31/2013 AFP 2.4.   CT abdomen 08/31/2013 with postoperative changes of partial hepatectomy with small postoperative fluid collection along the resection margin. No definite signs to suggest residual or locally recurrent disease. Several borderline enlarged and minimally enlarged lymph nodes superior to the liver in the juxtapericardiac fat of the lower anterior mediastinum with the largest measuring 11 mm slightly increased compared to the prior study 01/15/2013. Several prominent non-pathologically enlarged upper abdominal ligament lymph nodes similar to the prior study. Small esophageal varices.   CT abdomen 03/03/2012 without evidence of recurrent hepatocellular carcinoma, stable right lung base nodule  CT the abdomen and pelvis 09/06/2014 without  evidence of recurrent hepatocellular carcinoma, slight enlargement of pre-cardiac lymph nodes  CT abdomen/pelvis 09/07/2015 with no findings for recurrent tumor or metastatic disease. Stable small 3 mm right middle lobe pulmonary nodule since 2014. Epicardial lymph node measured 11 mm, previously 13 mm. Stable cirrhotic changes. Fairly significant esophageal varices.  CT 09/03/2016-negative for recurrent hepatocellular carcinoma, changes of cirrhosis with varices 2. Cirrhosis. 3. Hepatitis B core antibody positive. 4. Diabetes. 5. Severe microcytic anemia-likely iron deficiency anemia-improved 6. Colonoscopy 12/27/2016-external and internal hemorrhoids. Diverticulosis in the sigmoid, descending and transverse colon. One medium polyp in the distal descending colon. 3 small polyps in the transverse colon. 2 diminutive polyps in the rectum and the proximal ascending colon. Medium-sized lipoma transverse colon. Small lipoma mid ascending colon. Multiple nonbleeding colonic angiodysplastic lesions. Pathology-tubular adenomas, hyperplastic polyps. 7. Leukopenia/thrombocytopenia-likely secondary to cirrhosis    Disposition:  [name] remains in clinical remission from hepatocellular carcinoma. The microcytic anemia has improved with iron replacement. He is followed by Dr. Watt Climes. He will return for an office visit and CBC in 4 months.  15 minutes were spent with the patient today. The majority of the time was used for counseling and coordination of care.  Donneta Romberg, MD  03/28/2017  9:24 AM

## 2017-03-29 LAB — AFP TUMOR MARKER: AFP, Serum, Tumor Marker: 3.5 ng/mL (ref 0.0–8.3)

## 2017-04-04 ENCOUNTER — Telehealth: Payer: Self-pay

## 2017-04-04 ENCOUNTER — Ambulatory Visit (INDEPENDENT_AMBULATORY_CARE_PROVIDER_SITE_OTHER): Payer: Medicare Other

## 2017-04-04 ENCOUNTER — Ambulatory Visit (INDEPENDENT_AMBULATORY_CARE_PROVIDER_SITE_OTHER): Payer: Medicare Other | Admitting: Family Medicine

## 2017-04-04 ENCOUNTER — Encounter: Payer: Self-pay | Admitting: Family Medicine

## 2017-04-04 VITALS — BP 133/72 | HR 79 | Temp 97.4°F | Ht 69.0 in | Wt 208.0 lb

## 2017-04-04 VITALS — BP 123/72 | HR 73 | Temp 97.5°F | Resp 16 | Ht 69.0 in | Wt 208.0 lb

## 2017-04-04 DIAGNOSIS — D509 Iron deficiency anemia, unspecified: Secondary | ICD-10-CM

## 2017-04-04 DIAGNOSIS — R5383 Other fatigue: Secondary | ICD-10-CM | POA: Diagnosis not present

## 2017-04-04 DIAGNOSIS — G4733 Obstructive sleep apnea (adult) (pediatric): Secondary | ICD-10-CM | POA: Diagnosis not present

## 2017-04-04 DIAGNOSIS — Z0000 Encounter for general adult medical examination without abnormal findings: Secondary | ICD-10-CM | POA: Diagnosis not present

## 2017-04-04 DIAGNOSIS — R413 Other amnesia: Secondary | ICD-10-CM

## 2017-04-04 DIAGNOSIS — E1165 Type 2 diabetes mellitus with hyperglycemia: Secondary | ICD-10-CM

## 2017-04-04 MED ORDER — BLOOD GLUCOSE METER KIT: PACK | 0 refills | Status: DC

## 2017-04-04 NOTE — Progress Notes (Signed)
f °

## 2017-04-04 NOTE — Telephone Encounter (Signed)
Called in rx for glucose meter kit to [employer] [facility].

## 2017-04-04 NOTE — Progress Notes (Signed)
Subjective:   [name] is a 71 y.o. male who presents for an Initial Medicare Annual Wellness Visit.  Review of Systems  N/A Cardiac Risk Factors include: advanced age (>75men, >92 women);male gender;diabetes mellitus;obesity (BMI >30kg/m2)    Objective:    Today's Vitals   04/04/17 0924 04/04/17 0925  BP: 133/72   Pulse: 79   Temp: (!) 97.4 F (36.3 C)   TempSrc: Oral   Weight: 208 lb (94.3 kg)   Height: 5\' 9"  (1.753 m)   PainSc:  0-No pain   Body mass index is 30.72 kg/m.  Current Medications (verified) Outpatient Encounter Prescriptions as of 04/04/2017  Medication Sig  . alfuzosin (UROXATRAL) 10 MG 24 hr tablet Take 10 mg by mouth at bedtime.   [name] Kitchen amphetamine-dextroamphetamine (ADDERALL XR) 30 MG 24 hr capsule Take 1 capsule (30 mg total) by mouth every morning.  . ferrous sulfate (KP FERROUS SULFATE) 325 (65 FE) MG tablet Take 325 mg by mouth 3 (three) times daily with meals.   [name] Kitchen glipiZIDE (GLUCOTROL) 10 MG tablet TAKE 1 TABLET BY MOUTH  TWICE A DAY BEFORE MEALS  . losartan (COZAAR) 25 MG tablet Take 1 tablet (25 mg total) by mouth daily.  . metFORMIN (GLUCOPHAGE) 1000 MG tablet Take 1 tablet (1,000 mg total) by mouth 2 (two) times daily with a meal.  . metoprolol succinate (TOPROL-XL) 50 MG 24 hr tablet Take 1 tablet (50 mg total) by mouth daily. Take with or immediately following a meal.  . pravastatin (PRAVACHOL) 20 MG tablet Take 1 tablet (20 mg total) by mouth at bedtime.  [name] Kitchen spironolactone (ALDACTONE) 25 MG tablet Take 1 tablet (25 mg total) by mouth 2 (two) times daily.  . sitaGLIPtin (JANUVIA) 100 MG tablet Take 1 tablet (100 mg total) by mouth daily. (Patient not taking: Reported on 04/04/2017)   No facility-administered encounter medications on file as of 04/04/2017.     Allergies (verified) Ace inhibitors   History: Past Medical History:  Diagnosis Date  . Cancer [facility])    hepatocellular cancer   . Diabetes mellitus without complication ([place])    . Hepatitis   . Hyperlipidemia   . Hypertension   . Narcolepsy    per office visit note of 08/2011   . Sleep apnea    Past Surgical History:  Procedure Laterality Date  . LAPAROSCOPY  03/02/2013   Procedure: LAPAROSCOPY DIAGNOSTIC;  Surgeon: [name], MD;  Location: [facility];  Service: General;;  . LIVER ULTRASOUND  03/02/2013   Procedure: LIVER ULTRASOUND;  Surgeon: [name], MD;  Location: [facility];  Service: General;;  . OPEN PARTIAL HEPATECTOMY   03/02/2013   Procedure: OPEN PARTIAL HEPATECTOMY [83];  Surgeon: [name], MD;  Location: [facility];  Service: General;;  DX LAPAROSCOPY, INTRAOPERATIVE LIVER ULTRASOUND, OPEN PARTIAL HEPATECTOMY  . pinched nerve in back     Family History  Problem Relation Age of Onset  . Dementia Mother   . Cancer Father   . Diabetes Brother   . Hypertension Brother   . Cancer Brother    Social History   Occupational History  . Not on file.   Social History Main Topics  . Smoking status: Former Smoker    Years: 1.00  . Smokeless tobacco: Never Used  . Alcohol use No  . Drug use: No  . Sexual activity: Yes   Tobacco Counseling Counseling given: Not Answered   Activities of Daily Living In your present state of health, do you have  any difficulty performing the following activities: 04/04/2017  Hearing? Y  Comment wears hearing aid   Vision? N  Difficulty concentrating or making decisions? Y  Walking or climbing stairs? N  Dressing or bathing? N  Doing errands, shopping? N  Preparing Food and eating ? N  Using the Toilet? N  In the past six months, have you accidently leaked urine? N  Do you have problems with loss of bowel control? N  Managing your Medications? N  Managing your Finances? N  Housekeeping or managing your Housekeeping? N  Some recent data might be hidden    Immunizations and Health Maintenance Immunization History  Administered Date(s) Administered  . Influenza Split 06/02/2012  . Influenza,inj,Quad PF,36+  Mos 06/01/2013, 04/19/2014, 06/13/2015, 04/19/2016  . Pneumococcal Conjugate-13 03/14/2015  . Pneumococcal Polysaccharide-23 08/03/2011, 06/02/2012   Health Maintenance Due  Topic Date Due  . TETANUS/TDAP  08/26/1963  . INFLUENZA VACCINE  03/20/2017    Patient Care Team: Wendie Agreste, MD as PCP - General (Family Medicine) Dohmeier, Asencion Partridge, MD as Consulting Physician (Neurology) Festus Aloe, MD as Consulting Physician (Urology)  Indicate any recent Medical Services you may have received from other than [facility] providers in the past year (date may be approximate).    Assessment:   This is a routine wellness examination for [name].   Hearing/Vision screen Vision Screening Comments: Patient sees eye doctor every 6 months for routine eye exams. He can't remember doctor's name.   Dietary issues and exercise activities discussed: Current Exercise Habits: The patient does not participate in regular exercise at present (stays active doing yardwork daily ), Exercise limited by: None identified  Goals    . Increase water intake          Patient will try to drink 4-5 glasses of water daily       Depression Screen PHQ 2/9 Scores 04/04/2017 02/08/2017 02/08/2017 01/05/2017  PHQ - 2 Score 0 0 0 0    Fall Risk Fall Risk  04/04/2017 02/08/2017 02/08/2017 01/05/2017 04/19/2016  Falls in the past year? Yes Yes No No No  Number falls in past yr: 1 1 - - -  Injury with Fall? Yes Yes - - -  Risk Factor Category  High Fall Risk - - - -  Risk for fall due to : Other (Comment) - - - -  Risk for fall due to: Comment tripped over the sidewalk  - - - -  Follow up Education provided - - - -    Cognitive Function:     6CIT Screen 04/04/2017  What Year? 0 points  What month? 0 points  What time? 0 points  Count back from 20 0 points  Months in reverse 4 points  Repeat phrase 2 points  Total Score 6    Screening Tests Health Maintenance  Topic Date Due  . TETANUS/TDAP  08/26/1963  .  INFLUENZA VACCINE  03/20/2017  . HEMOGLOBIN A1C  07/08/2017  . OPHTHALMOLOGY EXAM  01/10/2018  . FOOT EXAM  02/08/2018  . COLONOSCOPY  06/15/2024  . Hepatitis C Screening  Completed  . PNA vac Low Risk Adult  Completed        Plan:  I have personally reviewed and addressed the Medicare Annual Wellness questionnaire and have noted the following in the patient's chart:  A. Medical and social history B. Use of alcohol, tobacco or illicit drugs  C. Current medications and supplements D. Functional ability and status E.  Nutritional status F.  Physical  activity G. Advance directives H. List of other physicians I.  Hospitalizations, surgeries, and ER visits in previous 12 months J.  [place] such as hearing and vision if needed, cognitive and depression L. Referrals and appointments - none  In addition, I have reviewed and discussed with patient certain preventive protocols, quality metrics, and best practice recommendations. A written personalized care plan for preventive services as well as general preventive health recommendations were provided to patient.    Signed,  Andrez Grime, LPN Nurse Health Advisor   MD Recommendations: none

## 2017-04-04 NOTE — Progress Notes (Signed)
Subjective:  By signing my name below, I, [name], attest that this documentation has been prepared under the direction and in the presence of [name], MD. Electronically Signed: [name], [place]. 04/04/2017 , 11:44 AM .  Patient was seen in Room 25 .   Patient ID: [name], male    DOB: 05/23/1946, 71 y.o.   MRN: [id number] Chief Complaint  Patient presents with  . Memory Loss  . Fatigue   HPI [name] is a 71 y.o. male  Patient was last seen on June 22nd. He was additionally evaluated with welcome to Medicare today by other provider, but he is here to discuss memory loss and fatigue. He has a history of multiple medical problems including narcolepsy, OSA, hepatocellular cancer in remission (followed by Dr. Benay Spice). His sleep specialist/neuro is Dr. Brett Fairy, last visit on July 10th.   Diabetes His last A1C was 8.9 on May 19th. He had been out of some medicine at previous visit, so continued on same dose with plan for recheck in 2 weeks to review home readings, and decide if medication change is needed. I saw him for follow up on June 22nd. At that time, he was back on Januvia 171m QD, glipizide 580mBID, and metformin 100011mID. His home readings at times in the 200s; denies any hypoglycemia. He notes Januvia has been cost prohibited, so continued on glipizide and metformin, but increased glipizide to 42m1mD. Discussed potential GLP1 if persistent high readings or possible SGLT2.   Pill bottles reviewed during visit: metformin 1000mg22m and still has glipizide 5mg B48m He notes he hasn't been checking his home sugars regularly, and lowest being 50 in the past 2-3 months. His sugar has been 125-150 on current medication.   Fatigue/memory changes Lab Results  Component Value Date   WBC 3.0 (L) 03/28/2017   HGB 10.4 (L) 03/28/2017   HCT 31.6 (L) 03/28/2017   MCV 99.4 (H) 03/28/2017   PLT 117 (L) 03/28/2017   His hemoglobin of 10.4 from 9.3.  He has history of anemia microcytic; thought to be due to iron deficiency, continued on iron supplementation when discussed with hematologist on Aug 9th. 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   Memory changes were discussed with neuro based on review of note on July 10th, not amnestic, just memory was slower and short term memory "blips". At that time, he was still taking Adderall for narcolepsy, independent in ADL's. Did note forgetfulness without any behavior changes associated with memory. He has not been able to purchase a cpap machine, so not on sleep apnea treatment. Home sleep test was ordered. Plan for follow up visit in 3 month, MMSE- MOCA memory assessment.   Patient reports his memory has been getting worse. His niece is with him today and she agrees that patient's memory has gotten worse in the last 60 days, and more prominent. He was scheduled to have sleep testing done past Sunday (4 days ago) but their office had messed up the schedule and had to reschedule his sleep study. He denies feeling anxious, depressed, or stressed regarding his memory. He is still able to manage his own finances, cooking, and shopping.   His fatigue has been ongoing for a long time now. He's taking iron supplementation 3 times a day currently. He is also still taking Adderall for his narcolepsy.   His niece also mentions patient missing  and keeping track of his medications. He plans to fix this issue by buying a medication box after his visit today.    Patient Active Problem List   Diagnosis Date Noted  . Aortic atherosclerosis ([place]) 11/27/2016  . 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 Altered mental status 03/05/2013  . Respiratory failure, post-operative ([place]) 03/02/2013  . Hepatitis B 02/24/2013  . Cancer, hepatocellular ([place]) 02/10/2013  . Liver tumor-bleeding 01/15/2013  . Epidermal cyst 06/18/2012  .  OSA (obstructive sleep apnea) 03/13/2012  . Abnormal leg movement 12/12/2011  . Blood pressure elevated 09/06/2011  . Diabetes mellitus ([place]) 09/06/2011  . 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 BPH (benign prostatic hyperplasia) 09/06/2011  . Diverticula of colon 09/06/2011   Past Medical History:  Diagnosis Date  . Cancer [facility])    hepatocellular cancer   . Diabetes mellitus without complication ([place])   . Hepatitis   . Hyperlipidemia   . Hypertension   . Narcolepsy    per office visit note of 08/2011   . Sleep apnea    Past Surgical History:  Procedure Laterality Date  . LAPAROSCOPY  03/02/2013   Procedure: LAPAROSCOPY DIAGNOSTIC;  Surgeon: [name], MD;  [facility];  Service: General;;  . LIVER ULTRASOUND  03/02/2013   Procedure: LIVER ULTRASOUND;  Surgeon: [name], MD;  [facility];  Service: General;;  . OPEN PARTIAL HEPATECTOMY   03/02/2013   Procedure: OPEN PARTIAL HEPATECTOMY [83];  Surgeon: [name], MD;  [facility];  Service: General;;  DX LAPAROSCOPY, INTRAOPERATIVE LIVER ULTRASOUND, OPEN PARTIAL HEPATECTOMY  . pinched nerve in back     Allergies  Allergen Reactions  . Ace Inhibitors Swelling    Angioedema - face.    Prior to Admission medications   Medication Sig Start Date End Date Taking? Authorizing Provider  alfuzosin (UROXATRAL) 10 MG 24 hr tablet Take 10 mg by mouth at bedtime.  12/19/12   [provider]  amphetamine-dextroamphetamine (ADDERALL XR) 30 MG 24 hr capsule Take 1 capsule (30 mg total) by mouth every morning. 02/26/17   Dohmeier, Asencion Partridge, MD  ferrous sulfate (KP FERROUS SULFATE) 325 (65 FE) MG tablet Take 325 mg by mouth 3 (three) times daily with meals.     [provider]  glipiZIDE (GLUCOTROL) 10 MG tablet TAKE 1 TABLET BY MOUTH  TWICE A DAY BEFORE MEALS 02/08/17   Wendie Agreste, MD  losartan (COZAAR) 25 MG tablet Take 1 tablet (25 mg total) by mouth daily. 01/05/17   Wendie Agreste, MD  metFORMIN (GLUCOPHAGE)  1000 MG tablet Take 1 tablet (1,000 mg total) by mouth 2 (two) times daily with a meal. 01/05/17   Wendie Agreste, MD  metoprolol succinate (TOPROL-XL) 50 MG 24 hr tablet Take 1 tablet (50 mg total) by mouth daily. Take with or immediately following a meal. 01/05/17   Wendie Agreste, MD  pravastatin (PRAVACHOL) 20 MG tablet Take 1 tablet (20 mg total) by mouth at bedtime. 01/05/17   Wendie Agreste, MD  sitaGLIPtin (JANUVIA) 100 MG tablet Take 1 tablet (100 mg total) by mouth daily. Patient not taking: Reported on 04/04/2017 01/05/17   Wendie Agreste, MD  spironolactone (ALDACTONE) 25 MG tablet Take 1 tablet (25 mg total) by mouth 2 (two) times daily. 01/05/17   Wendie Agreste, MD   Social History   Social History  . Marital status: Single    Spouse name: N/A  . Number of children: 2  . Years of education:  15+   Occupational History  . Not on file.   Social History Main Topics  . Smoking status: Former Smoker    Years: 1.00  . Smokeless tobacco: Never Used  . Alcohol use No  . Drug use: No  . Sexual activity: Yes   Other Topics Concern  . Not on file   Social History Narrative   Patient is single and lives alone- became widower when 1 child was 56 year old, divorced 2nd marriage   Has #2 grown children, not in area or involved in his life   Patient is retired.   Patient has a college education.   Patient is right-handed.   Patient drinks maybe two sodas daily.   Review of Systems  Constitutional: Positive for fatigue. Negative for unexpected weight change.  Eyes: Negative for visual disturbance.  Respiratory: Negative for cough, chest tightness and shortness of breath.   Cardiovascular: Negative for chest pain, palpitations and leg swelling.  Gastrointestinal: Negative for abdominal pain and blood in stool.  Neurological: Negative for dizziness, light-headedness and headaches.       Objective:   Physical Exam  Constitutional: He is oriented to person, place,  and time. He appears well-developed and well-nourished.  HENT:  Head: Normocephalic and atraumatic.  Eyes: Pupils are equal, round, and reactive to light. EOM are normal.  Neck: No JVD present. Carotid bruit is not present.  Cardiovascular: Normal rate, regular rhythm and normal heart sounds.   No murmur heard. Pulmonary/Chest: Effort normal and breath sounds normal. He has no rales.  Musculoskeletal: He exhibits no edema.  Neurological: He is alert and oriented to person, place, and time.  Skin: Skin is warm and dry.  Psychiatric: He has a normal mood and affect.  Vitals reviewed.   Vitals:   04/04/17 1030  BP: 123/72  Pulse: 73  Resp: 16  Temp: (!) 97.5 F (36.4 C)  TempSrc: Oral  SpO2: 100%  Weight: 208 lb (94.3 kg)  Height: '5\' 9"'  (1.753 m)      Assessment & Plan:   DAMICO PARTIN is a 71 y.o. male Fatigue, unspecified type OSA (obstructive sleep apnea) Iron deficiency anemia, unspecified iron deficiency anemia type  -Suspected multifactorial cause of fatigue. Untreated sleep apnea, history of narcolepsy, appears to have some inconsistency with taking medications.  -History of anemia, but most recent CBC was improved.  -History of diabetes, and possibly hypoglycemia episodes with incorrect dosing medication.  -Presents with family member, encouraged to obtain pill box to measure about medication for each day, and set alarm if needed for timing.  - Follow-up with sleep specialist regarding sleep apnea testing and treatment, as well as memory changes as listed below.  -RTC precautions if persistent  Type 2 diabetes mellitus with hyperglycemia, without long-term current use of insulin (HCC) - Plan: Hemoglobin A1c, blood glucose meter kit and supplies  -Suspect some inconsistency in dosing of medications. Plan for increased glipizide last visit, but possible hypoglycemia on 5 mg twice a day dosing. Last A1c was higher indicating decreased control. Tablet-for now,  continue metformin same dose twice a day, continue glipizide 5 mg twice a day with pill reminder as above, and follow-up in the next few weeks with home readings to determine next change.  -Medicare wellness exam today, nurse will check into resources for Januvia and possibly GLP-1/SGLT 2 coverage if we do need to add those medications.  Memory changes  - ADLs/IADLs intact. Denies amnesia, but patient and family member present with  patient today have noticed a change in his symptoms over the past 6 months where he does seem to be more forgetful. As discussed this could be related to untreated sleep apnea, variations in glycemic control, but recommended follow-up with neuro sooner for formalized testing with MMSE, other testing as discussed in last note.  Meds ordered this encounter  Medications  . blood glucose meter kit and supplies    Sig: Dispense based on patient and insurance preference. Use up to four times daily as directed. (FOR ICD-9 250.00, 250.01).    Dispense:  1 each    Refill:  0    Order Specific Question:   Number of strips    Answer:   30    Order Specific Question:   Number of lancets    Answer:   30   Patient Instructions   Use a pill box to lessen chance of missed medications, taking meds wrong time, or duplicating medications.   I would like you to discuss your memory symptoms with your neurologist  (Dr. Brett Fairy) as she planned on seeing you in 3 months for more formal memory testing. Untreated sleep apnea may be a factor as well. Please call her office for appointment sooner to discuss memory as that has been worsening past few months, and proceed with sleep testing.  For diabetes, pill box may be helpful for medication adherence. I'm concerned about her blood sugars may be upper down based on how you're taking medication. I will check her hemoglobin A1c today, continue glipizide 5 mg twice per day for now, then if hemoglobin A1c is elevated, would recommend increasing  to 10 mg twice per day. Recheck WITH blood sugar log in next 2-3 weeks before changing any medicine.   Fatigue can be related to many causes, including anemia, diabetes, untreated sleep apnea, and narcolepsy. Anemia improved last week. Follow up with neurologist with sleep apnea testing, and continue Adderall (can discuss any concerns with that med with neuro).     IF you received an x-ray today, you will receive an invoice from [facility]. Please contact [facility] at [phone] with questions or concerns regarding your invoice.   IF you received labwork today, you will receive an invoice from [facility]. Please contact [facility] at [phone] with questions or concerns regarding your invoice.   Our billing staff will not be able to assist you with questions regarding bills from these companies.  You will be contacted with the lab results as soon as they are available. The fastest way to get your results is to activate your My Chart account. Instructions are located on the last page of this paperwork. If you have not heard from [place] regarding the results in 2 weeks, please contact this office.       I personally performed the services described in this documentation, which was scribed in my presence. The recorded information has been reviewed and considered for accuracy and completeness, addended by me as needed, and agree with information above.  Signed,   [name], MD [facility].  04/05/17 1:41 PM

## 2017-04-04 NOTE — Patient Instructions (Addendum)
[name] , Thank you for taking time to come for your Medicare Wellness Visit. I appreciate your ongoing commitment to your health goals. Please review the following plan we discussed and let me know if I can assist you in the future.   Screening recommendations/referrals: Colonoscopy: completed 12/27/2016 Recommended yearly ophthalmology/optometry visit for glaucoma screening and checkup Recommended yearly dental visit for hygiene and checkup  Vaccinations: Influenza vaccine: due 04/2017 Pneumococcal vaccine: series completed Tdap vaccine: Insurance doesn't cover Shingles vaccine: Patient will check with insurance and pharmacy.   Advanced directives: Please bring a copy of your POA (Power of Attorney) and/or Living Will to your next appointment.   Conditions/risks identified: Patient will try to drink 4-5 glasses of water daily  Next appointment: 04/04/17 @ 10:20 am  Preventive Care 65 Years and Older, Male Preventive care refers to lifestyle choices and visits with your health care provider that can promote health and wellness. What does preventive care include?  A yearly physical exam. This is also called an annual well check.  Dental exams once or twice a year.  Routine eye exams. Ask your health care provider how often you should have your eyes checked.  Personal lifestyle choices, including:  Daily care of your teeth and gums.  Regular physical activity.  Eating a healthy diet.  Avoiding tobacco and drug use.  Limiting alcohol use.  Practicing safe sex.  Taking low doses of aspirin every day.  Taking vitamin and mineral supplements as recommended by your health care provider. What happens during an annual well check? The services and screenings done by your health care provider during your annual well check will depend on your age, overall health, lifestyle risk factors, and family history of disease. Counseling  Your health care provider may ask you  questions about your:  Alcohol use.  Tobacco use.  Drug use.  Emotional well-being.  Home and relationship well-being.  Sexual activity.  Eating habits.  History of falls.  Memory and ability to understand (cognition).  Work and work Statistician. Screening  You may have the following tests or measurements:  Height, weight, and BMI.  Blood pressure.  Lipid and cholesterol levels. These may be checked every 5 years, or more frequently if you are over 72 years old.  Skin check.  Lung cancer screening. You may have this screening every year starting at age 35 if you have a 30-pack-year history of smoking and currently smoke or have quit within the past 15 years.  Fecal occult blood test (FOBT) of the stool. You may have this test every year starting at age 72.  Flexible sigmoidoscopy or colonoscopy. You may have a sigmoidoscopy every 5 years or a colonoscopy every 10 years starting at age 72.  Prostate cancer screening. Recommendations will vary depending on your family history and other risks.  Hepatitis C blood test.  Hepatitis B blood test.  Sexually transmitted disease (STD) testing.  Diabetes screening. This is done by checking your blood sugar (glucose) after you have not eaten for a while (fasting). You may have this done every 1-3 years.  Abdominal aortic aneurysm (AAA) screening. You may need this if you are a current or former smoker.  Osteoporosis. You may be screened starting at age 46 if you are at high risk. Talk with your health care provider about your test results, treatment options, and if necessary, the need for more tests. Vaccines  Your health care provider may recommend certain vaccines, such as:  Influenza vaccine. This is  recommended every year.  Tetanus, diphtheria, and acellular pertussis (Tdap, Td) vaccine. You may need a Td booster every 10 years.  Zoster vaccine. You may need this after age 72.  Pneumococcal 13-valent conjugate  (PCV13) vaccine. One dose is recommended after age 72.  Pneumococcal polysaccharide (PPSV23) vaccine. One dose is recommended after age 72. Talk to your health care provider about which screenings and vaccines you need and how often you need them. This information is not intended to replace advice given to you by your health care provider. Make sure you discuss any questions you have with your health care provider. Document Released: 09/02/2015 Document Revised: 04/25/2016 Document Reviewed: 06/07/2015 Elsevier Interactive Patient Education  2017 [place] Prevention in the Home Falls can cause injuries. They can happen to people of all ages. There are many things you can do to make your home safe and to help prevent falls. What can I do on the outside of my home?  Regularly fix the edges of walkways and driveways and fix any cracks.  Remove anything that might make you trip as you walk through a door, such as a raised step or threshold.  Trim any bushes or trees on the path to your home.  Use bright outdoor lighting.  Clear any walking paths of anything that might make someone trip, such as rocks or tools.  Regularly check to see if handrails are loose or broken. Make sure that both sides of any steps have handrails.  Any raised decks and porches should have guardrails on the edges.  Have any leaves, snow, or ice cleared regularly.  Use sand or salt on walking paths during winter.  Clean up any spills in your garage right away. This includes oil or grease spills. What can I do in the bathroom?  Use night lights.  Install grab bars by the toilet and in the tub and shower. Do not use towel bars as grab bars.  Use non-skid mats or decals in the tub or shower.  If you need to sit down in the shower, use a plastic, non-slip stool.  Keep the floor dry. Clean up any water that spills on the floor as soon as it happens.  Remove soap buildup in the tub or shower  regularly.  Attach bath mats securely with double-sided non-slip rug tape.  Do not have throw rugs and other things on the floor that can make you trip. What can I do in the bedroom?  Use night lights.  Make sure that you have a light by your bed that is easy to reach.  Do not use any sheets or blankets that are too big for your bed. They should not hang down onto the floor.  Have a firm chair that has side arms. You can use this for support while you get dressed.  Do not have throw rugs and other things on the floor that can make you trip. What can I do in the kitchen?  Clean up any spills right away.  Avoid walking on wet floors.  Keep items that you use a lot in easy-to-reach places.  If you need to reach something above you, use a strong step stool that has a grab bar.  Keep electrical cords out of the way.  Do not use floor polish or wax that makes floors slippery. If you must use wax, use non-skid floor wax.  Do not have throw rugs and other things on the floor that can make you trip.  What can I do with my stairs?  Do not leave any items on the stairs.  Make sure that there are handrails on both sides of the stairs and use them. Fix handrails that are broken or loose. Make sure that handrails are as long as the stairways.  Check any carpeting to make sure that it is firmly attached to the stairs. Fix any carpet that is loose or worn.  Avoid having throw rugs at the top or bottom of the stairs. If you do have throw rugs, attach them to the floor with carpet tape.  Make sure that you have a light switch at the top of the stairs and the bottom of the stairs. If you do not have them, ask someone to add them for you. What else can I do to help prevent falls?  Wear shoes that:  Do not have high heels.  Have rubber bottoms.  Are comfortable and fit you well.  Are closed at the toe. Do not wear sandals.  If you use a stepladder:  Make sure that it is fully  opened. Do not climb a closed stepladder.  Make sure that both sides of the stepladder are locked into place.  Ask someone to hold it for you, if possible.  Clearly mark and make sure that you can see:  Any grab bars or handrails.  First and last steps.  Where the edge of each step is.  Use tools that help you move around (mobility aids) if they are needed. These include:  Canes.  Walkers.  Scooters.  Crutches.  Turn on the lights when you go into a dark area. Replace any light bulbs as soon as they burn out.  Set up your furniture so you have a clear path. Avoid moving your furniture around.  If any of your floors are uneven, fix them.  If there are any pets around you, be aware of where they are.  Review your medicines with your doctor. Some medicines can make you feel dizzy. This can increase your chance of falling. Ask your doctor what other things that you can do to help prevent falls. This information is not intended to replace advice given to you by your health care provider. Make sure you discuss any questions you have with your health care provider. Document Released: 06/02/2009 Document Revised: 01/12/2016 Document Reviewed: 09/10/2014 Elsevier Interactive Patient Education  2017 [employer].

## 2017-04-04 NOTE — Patient Instructions (Addendum)
Use a pill box to lessen chance of missed medications, taking meds wrong time, or duplicating medications.   I would like you to discuss your memory symptoms with your neurologist  (Dr. Brett Fairy) as she planned on seeing you in 3 months for more formal memory testing. Untreated sleep apnea may be a factor as well. Please call her office for appointment sooner to discuss memory as that has been worsening past few months, and proceed with sleep testing.  For diabetes, pill box may be helpful for medication adherence. I'm concerned about her blood sugars may be upper down based on how you're taking medication. I will check her hemoglobin A1c today, continue glipizide 5 mg twice per day for now, then if hemoglobin A1c is elevated, would recommend increasing to 10 mg twice per day. Recheck WITH blood sugar log in next 2-3 weeks before changing any medicine.   Fatigue can be related to many causes, including anemia, diabetes, untreated sleep apnea, and narcolepsy. Anemia improved last week. Follow up with neurologist with sleep apnea testing, and continue Adderall (can discuss any concerns with that med with neuro).     IF you received an x-ray today, you will receive an invoice from [facility]. Please contact [facility] at [phone] with questions or concerns regarding your invoice.   IF you received labwork today, you will receive an invoice from [facility]. Please contact [facility] at [phone] with questions or concerns regarding your invoice.   Our billing staff will not be able to assist you with questions regarding bills from these companies.  You will be contacted with the lab results as soon as they are available. The fastest way to get your results is to activate your My Chart account. Instructions are located on the last page of this paperwork. If you have not heard from [place] regarding the results in 2 weeks, please contact this office.

## 2017-04-05 LAB — HEMOGLOBIN A1C
Est. average glucose Bld gHb Est-mCnc: 114 mg/dL
Hgb A1c MFr Bld: 5.6 % (ref 4.8–5.6)

## 2017-04-05 NOTE — Telephone Encounter (Signed)
Pharmacy called back stating that the pt has medicare and that they need a written script with diagnosis codes and specific instructions.  Please send over script.

## 2017-04-09 ENCOUNTER — Encounter: Payer: Self-pay | Admitting: Neurology

## 2017-04-09 ENCOUNTER — Ambulatory Visit (INDEPENDENT_AMBULATORY_CARE_PROVIDER_SITE_OTHER): Payer: Medicare Other | Admitting: Neurology

## 2017-04-09 VITALS — BP 131/75 | HR 71 | Ht 69.0 in | Wt 214.0 lb

## 2017-04-09 DIAGNOSIS — R413 Other amnesia: Secondary | ICD-10-CM

## 2017-04-09 DIAGNOSIS — R0683 Snoring: Secondary | ICD-10-CM

## 2017-04-09 DIAGNOSIS — G4733 Obstructive sleep apnea (adult) (pediatric): Secondary | ICD-10-CM | POA: Diagnosis not present

## 2017-04-09 DIAGNOSIS — G47411 Narcolepsy with cataplexy: Secondary | ICD-10-CM

## 2017-04-09 DIAGNOSIS — G4719 Other hypersomnia: Secondary | ICD-10-CM | POA: Diagnosis not present

## 2017-04-09 DIAGNOSIS — G47421 Narcolepsy in conditions classified elsewhere with cataplexy: Secondary | ICD-10-CM | POA: Diagnosis not present

## 2017-04-09 MED ORDER — DONEPEZIL HCL 5 MG PO TABS: 5.0000 mg | ORAL_TABLET | Freq: Every day | ORAL | 0 refills | Status: DC

## 2017-04-09 MED ORDER — DONEPEZIL HCL 5 MG PO TABS: 5.0000 mg | ORAL_TABLET | Freq: Every day | ORAL | 2 refills | Status: DC

## 2017-04-09 MED ORDER — AMPHETAMINE-DEXTROAMPHET ER 30 MG PO CP24: 30.0000 mg | ORAL_CAPSULE | Freq: Every morning | ORAL | 0 refills | Status: DC

## 2017-04-09 NOTE — Patient Instructions (Signed)
YOU will  be asked not to take adderall on the week of your upcoming sleep study, but can resume the day after the sleep study.  I prescribed Aricept in a generic form. 5 mg once a day, until you are seen by NP. CD  Donepezil tablets What is this medicine? ARICEPT  DONEPEZIL (doe NEP e zil) is used to treat mild to moderate dementia caused by Alzheimer's disease. This medicine may be used for other purposes; ask your health care provider or pharmacist if you have questions. COMMON BRAND NAME(S): Aricept What should I tell my health care provider before I take this medicine? They need to know if you have any of these conditions: -asthma or other lung disease -difficulty passing urine -head injury -heart disease -history of irregular heartbeat -liver disease -seizures (convulsions) -stomach or intestinal disease, ulcers or stomach bleeding -an unusual or allergic reaction to donepezil, other medicines, foods, dyes, or preservatives -pregnant or trying to get pregnant -breast-feeding How should I use this medicine? Take this medicine by mouth with a glass of water. Follow the directions on the prescription label. You may take this medicine with or without food. Take this medicine at regular intervals. This medicine is usually taken before bedtime. Do not take it more often than directed. Continue to take your medicine even if you feel better. Do not stop taking except on your doctor's advice. If you are taking the 23 mg donepezil tablet, swallow it whole; do not cut, crush, or chew it. Talk to your pediatrician regarding the use of this medicine in children. Special care may be needed. Overdosage: If you think you have taken too much of this medicine contact a poison control center or emergency room at once. NOTE: This medicine is only for you. Do not share this medicine with others. What if I miss a dose? If you miss a dose, take it as soon as you can. If it is almost time for your next  dose, take only that dose, do not take double or extra doses. What may interact with this medicine? Do not take this medicine with any of the following medications: -certain medicines for fungal infections like itraconazole, fluconazole, posaconazole, and voriconazole -cisapride -dextromethorphan; quinidine -dofetilide -dronedarone -pimozide -quinidine -thioridazine -ziprasidone This medicine may also interact with the following medications: -antihistamines for allergy, cough and cold -atropine -bethanechol -carbamazepine -certain medicines for bladder problems like oxybutynin, tolterodine -certain medicines for Parkinson's disease like benztropine, trihexyphenidyl -certain medicines for stomach problems like dicyclomine, hyoscyamine -certain medicines for travel sickness like scopolamine -dexamethasone -ipratropium -NSAIDs, medicines for pain and inflammation, like ibuprofen or naproxen -other medicines for Alzheimer's disease -other medicines that prolong the QT interval (cause an abnormal heart rhythm) -phenobarbital -phenytoin -rifampin, rifabutin or rifapentine This list may not describe all possible interactions. Give your health care provider a list of all the medicines, herbs, non-prescription drugs, or dietary supplements you use. Also tell them if you smoke, drink alcohol, or use illegal drugs. Some items may interact with your medicine. What should I watch for while using this medicine? Visit your doctor or health care professional for regular checks on your progress. Check with your doctor or health care professional if your symptoms do not get better or if they get worse. You may get drowsy or dizzy. Do not drive, use machinery, or do anything that needs mental alertness until you know how this drug affects you. What side effects may I notice from receiving this medicine? Side effects that you should  report to your doctor or health care professional as soon as  possible: -allergic reactions like skin rash, itching or hives, swelling of the face, lips, or tongue -feeling faint or lightheaded, falls -loss of bladder control -seizures -signs and symptoms of a dangerous change in heartbeat or heart rhythm like chest pain; dizziness; fast or irregular heartbeat; palpitations; feeling faint or lightheaded, falls; breathing problems -signs and symptoms of infection like fever or chills; cough; sore throat; pain or trouble passing urine -signs and symptoms of liver injury like dark yellow or brown urine; general ill feeling or flu-like symptoms; light-colored stools; loss of appetite; nausea; right upper belly pain; unusually weak or tired; yellowing of the eyes or skin -slow heartbeat or palpitations -unusual bleeding or bruising -vomiting Side effects that usually do not require medical attention (report to your doctor or health care professional if they continue or are bothersome): -diarrhea, especially when starting treatment -headache -loss of appetite -muscle cramps -nausea -stomach upset This list may not describe all possible side effects. Call your doctor for medical advice about side effects. You may report side effects to FDA at 1-800-FDA-1088. Where should I keep my medicine? Keep out of reach of children. Store at room temperature between 15 and 30 degrees C (59 and 86 degrees F). Throw away any unused medicine after the expiration date. NOTE: This sheet is a summary. It may not cover all possible information. If you have questions about this medicine, talk to your doctor, pharmacist, or health care provider.  2018 Elsevier/Gold Standard (2016-01-23 21:00:42)

## 2017-04-09 NOTE — Progress Notes (Signed)
SLEEP MEDICINE CLINIC   Provider:  Larey Seat, M D  Primary Care Physician:  Wendie Agreste, MD   Referring Provider: Wendie Agreste, MD  Chief Complaint  Patient presents with  . Follow-up    following up, pt states that he is forgetful, sometimes he feels like his right leg is weaker. he doesnt noticed notice it everyday. pt came to have a sleep study and there was a mix up on scheduling and he was unable to do it that day. pt states never recieved a call back on coming back in. I have reached out to our sleep lab to follow up on this. pt is hard of hearing.    HPI:  [name] is a 71 y.o. male , seen here as in a revisit , originally to discuss a sleep study. He has not had the sleep study yet, and instead mentioned his memory concerns. MMSE done.  [name] Kitchen PATIENT: [name] DOB: 1946-04-21  REASON FOR VISIT: follow up- narcolepsy, obstructive sleep apnea HISTORY FROM: patient.  I see [name] today on 04/09/2017. He no longer uses CPAP, but I'm very interested for him to come back for a sleep test. I'm worried that his AHI is higher than it used to be 10 years ago. [name] also has been narcoleptic, and he reports having short-term memory deficits. He is forgetful, he doesn't feel that it is every day to the same degree. He feels that in the morning when he is not fatigued his memory functions better than in the afternoon or evening. This is also not every day the same. He endorsed today the Epworth sleepiness score at 17 points, he did not endorse the fatigue severity score or our geriatric depression scale.    Interval history from 02/26/2017. This is the first visit with [name] since June 2016, in the meantime he has seen my nurse practitioner times three. [name] is no longer using CPAP as he don't longer owns a machine. The sleep apnea is currently untreated, but I have no doubt that he still suffers from apnea and that he  still snores. He had excessive daytime sleepiness which had been treated with Adderall. Xyrem also became unaffordable to the patient. He had tolerated Xyrem well for almost 8 years until he had to discontinue the medicine. He endorses the Epworth sleepiness score today at 16 points and the fatigue severity score at 30 points. It is clear that he is still excessively daytime sleepy but his only tool at this time is to use Adderall. I would like to retest him for sleep apnea.  The patient is now covered with [employer] in a [employer]. It should be possible for [place] to obtain a home sleep test and re-issue CPAP possibly with an auto titrator.  I have also reviewed [name]'s recent medical history, he had a fall in March of this year which led to some facial laceration. He denies having had cataplectic symptoms that led to the fall, he had stumbled.  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 Excell Seltzer. This tumor may cause additional fatigue and sleep issues.  He feels his memory is slower, not amnestic.     HISTORY OF PRESENT ILLNESS:   MM: Today 07/30/2016: [name] is a 71 year old male with a history of narcolepsy and obstructive sleep apnea. He returns today for follow-up. The patient reports that he is back  on Adderall extended release and is doing well. He denies any significant daytime sleepiness. He is able to complete all ADLs independently. 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 He does state recently he has noticed he is forgetful. States that he can be in a conversation and forget what he was going to say however he is able to recall it eventually. He does not notice any other behavior changes associated with his memory. He states that family nor friends and make any mention of his forgetfulness. The patient does have sleep apnea that is currently not  treated because he was unable to afford the CPAP machine. He returns today for an evaluation.    HISTORY 01/31/15 ([name]): [name] is a very pleasant 71 y.o.-year-old Right-handed male, with an underlying medical history of hypertension, hyperlipidemia, diabetes, who presents for followup consultation of his narcolepsy as well as sleep apnea.   01-31-15  The patient is unaccompanied today. I am seeing this patientfor well over 10 years now and treat his hypersomnia with Adderall and XYREM,  He discontinued XYREM after the drug's price escalated , I had have seen him last  on 06/11/2012 ,  which time he indicated that he no longer uses a CPAP machine.  He has sleep study in 2012 which showed moderate obstructive sleep apnea and was titrated on CPAP of 9 cm of water pressure. He had residual sleep apnea findings. we increased his pressure to 10 cm, which improved his residual AHI. While he was compliant with CPAP at the time he had problems with co-pays and indicated that he could not afford CPAP.  At some point he returned a CPAP machine to his home health company. He is willing to get restarted on treatment. He has a history of narcolepsy and indicates no recent cataplectic attacks. He was tried on Provigil which did not help sleepiness. He was last on Xyrem 4.5 g and 3 g each night In November 2015 , and he continued on Adderall XR 30 mg daily. He feels more sleepy since Xyrem stopped. Based on the recent liver surgery will need to review LFT every 3 month if patient stays on XYREM. He has Insomnia now, on XR adderall, which he never had before. I will refill medications and he does require any refills today.He indicates no new problems. He feels that his adderall only medication regimen is working "good enough ".  Sleep habits are as follows: bedtime varies between 10.30  and 12 pm and usually  is asleep promptly.  He sleeps well, but wakes once or twice for bathroom  breaks. Sleeping through otherwise, he wakes at 6.30 AM, wakes up spontaneously. He naps in daytime, almost daily. He naps after dinner, too.   Sleep medical history and family sleep history:  one other family member with narcolepsy, niece on brother's  side.  Liver surgery in 2016, symptoms of liver swelling, fatigue, cramping.   Social history:  Retired, disabled, no tobacco use, no ETOH, caffeine - tea twice a day.no coffee, soda twice week.  Review of Systems: Out of a complete 14 system review, the patient complains of only the following symptoms, and all other reviewed systems are negative.  sleepiness.  forgetfulness.  Epworth score 17 , Fatigue severity score n/a   , depression score n/a    Social History   Social History  . Marital status: Single    Spouse name: N/A  . Number of children:  2  . Years of education: 15+   Occupational History  . Not on file.   Social History Main Topics  . Smoking status: Former Smoker    Years: 1.00  . Smokeless tobacco: Never Used  . Alcohol use No  . Drug use: No  . Sexual activity: Yes   Other Topics Concern  . Not on file   Social History Narrative   Patient is single and lives alone- became widower when 1 child was 32 year old, divorced 2nd marriage   Has #2 grown children, not in area or involved in his life   Patient is retired.   Patient has a college education.   Patient is right-handed.   Patient drinks maybe two sodas daily.    Family History  Problem Relation Age of Onset  . Dementia Mother   . Cancer Father   . Diabetes Brother   . Hypertension Brother   . Cancer Brother     Past Medical History:  Diagnosis Date  . Cancer [facility])    hepatocellular cancer   . Diabetes mellitus without complication ([place])   . Hepatitis   . Hyperlipidemia   . Hypertension   . Narcolepsy    per office visit note of 08/2011   . Sleep apnea     Past Surgical History:  Procedure Laterality Date  . LAPAROSCOPY  03/02/2013    Procedure: LAPAROSCOPY DIAGNOSTIC;  Surgeon: [name], MD;  Location: [facility];  Service: General;;  . LIVER ULTRASOUND  03/02/2013   Procedure: LIVER ULTRASOUND;  Surgeon: [name], MD;  Location: [facility];  Service: General;;  . OPEN PARTIAL HEPATECTOMY   03/02/2013   Procedure: OPEN PARTIAL HEPATECTOMY [83];  Surgeon: [name], MD;  Location: [facility];  Service: General;;  DX LAPAROSCOPY, INTRAOPERATIVE LIVER ULTRASOUND, OPEN PARTIAL HEPATECTOMY  . pinched nerve in back      Current Outpatient Prescriptions  Medication Sig Dispense Refill  . alfuzosin (UROXATRAL) 10 MG 24 hr tablet Take 10 mg by mouth at bedtime.     [name] Kitchen amphetamine-dextroamphetamine (ADDERALL XR) 30 MG 24 hr capsule Take 1 capsule (30 mg total) by mouth every morning. 90 capsule 0  . blood glucose meter kit and supplies Dispense based on patient and insurance preference. Use up to four times daily as directed. (FOR ICD-9 250.00, 250.01). 1 each 0  . ferrous sulfate (KP FERROUS SULFATE) 325 (65 FE) MG tablet Take 325 mg by mouth 3 (three) times daily with meals.     [name] Kitchen glipiZIDE (GLUCOTROL) 10 MG tablet TAKE 1 TABLET BY MOUTH  TWICE A DAY BEFORE MEALS 180 tablet 0  . losartan (COZAAR) 25 MG tablet Take 1 tablet (25 mg total) by mouth daily. 90 tablet 1  . metFORMIN (GLUCOPHAGE) 1000 MG tablet Take 1 tablet (1,000 mg total) by mouth 2 (two) times daily with a meal. 180 tablet 1  . metoprolol succinate (TOPROL-XL) 50 MG 24 hr tablet Take 1 tablet (50 mg total) by mouth daily. Take with or immediately following a meal. 90 tablet 1  . pravastatin (PRAVACHOL) 20 MG tablet Take 1 tablet (20 mg total) by mouth at bedtime. 90 tablet 1  . sitaGLIPtin (JANUVIA) 100 MG tablet Take 1 tablet (100 mg total) by mouth daily. 90 tablet 1  . spironolactone (ALDACTONE) 25 MG tablet Take 1 tablet (25 mg total) by mouth 2 (two) times daily. 180 tablet 1   No current facility-administered medications for this visit.     Allergies as  of  04/09/2017 - Review Complete 04/04/2017  Allergen Reaction Noted  . Ace inhibitors Swelling 02/19/2012    Vitals: BP 131/75   Pulse 71   Ht '5\' 9"'  (1.753 m)   Wt 214 lb (97.1 kg)   BMI 31.60 kg/m  Last Weight:  Wt Readings from Last 1 Encounters:  04/09/17 214 lb (97.1 kg)   IRS:WNIO mass index is 31.6 kg/m.     Last Height:   Ht Readings from Last 1 Encounters:  04/09/17 '5\' 9"'  (1.753 m)    Physical exam:  General: The patient is awake, alert and appears not in acute distress. The patient is groomed. cleanly dressed,  Head: Normocephalic, atraumatic. Neck is supple. Mallampati 5, poor dentition, .  neck circumference: 17.25 . Nasal airflow congestion , Retrognathia is seen.  Cardiovascular:  Regular rate and rhythm - NSR regular, without  murmurs or carotid bruit, and without distended neck veins. Respiratory: Lungs are clear to auscultation. Skin:  Without evidence of edema, or rash Trunk: BMI is 32. The patient's posture is slumped   Neurologic exam : The patient is awake and alert, oriented to place and time.   Memory subjective  described as impaired- Memory delay is described.  MMSE - Mini Mental State Exam 04/09/2017  Orientation to time 3  Orientation to Place 5  Registration 2  Attention/ Calculation 2  Recall 1  Language- name 2 objects 2  Language- repeat 1  Language- follow 3 step command 3  Language- read & follow direction 1  Write a sentence 1  Copy design 0  Total score 21    Attention span & concentration ability appears limited.  Speech is non- fluent, with dysphonia. Mood and affect are abulic, fatigued.   Cranial nerves: Pupils are equal and briskly reactive to light. Facial motor strength is symmetric , his tongue and uvula move midline. Shoulder shrug  Is still symmetrical.  Motor exam: Normal tone, muscle bulk and symmetric strength in all extremities. He reports pain in the left leg- not steady but paroxysmal. Had fallen on his left hip.    Coordination:  Finger-to-nose maneuver  normal without evidence of ataxia, dysmetria or tremor. Gait and station: Patient walks without assistive device- Strength within normal limits.  Stance is stable and normal. Turns with 4 Steps.  No limp. Deep tendon reflexes: in the upper and lower extremities are symmetric and intact.    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   2) OSA, untreated - needs re evaluation. Sleep test ordered. Needs to have PSG or HST.   3) recent fall in May , left hip pain resolved , no back pian today.  Arthritis.   4) some shot term memory loss - not amnestic. This is not my impression - the patient reached a Mini-Mental Status Examination was 21 out of 30 points, his educational level he is a high school graduate and went 1 year to college at [employer]. He used to work as a Freight forwarder. He did not recall any learning disabilities or problems. He was lifelong excessively daytime sleepy.   The patient was advised of the nature of the diagnosed disorder , the treatment options and  the  risks for general health and wellness arising from not treating the condition.   I spent more than 25 minutes of face to face time with the patient.  Greater than 50% of time was spent in counseling and coordination of care. We have discussed the diagnosis and differential and I answered the patient's questions.    Plan:  Treatment plan and additional workup :  OST _ HST  and PSG ordered- will see , which one is approved . Goal is to restart CPAP with good compliance.  Adderall refilled.  Narcolepsy MSLT to follow up. Patient needs daytime naps.   aricept started.  Rv with NP in 3 month when on CPAP. , MMSE- MOCA added. Will see if OSA treatment improved his Epworth and MMSE .     Larey Seat, MD 5/91/3685, 9:92 PM  Certified in Neurology by ABPN Certified in [place] by [facility] [address], [place], [place] [address]

## 2017-04-11 ENCOUNTER — Ambulatory Visit: Payer: Medicare Other | Admitting: Family Medicine

## 2017-04-15 ENCOUNTER — Ambulatory Visit (INDEPENDENT_AMBULATORY_CARE_PROVIDER_SITE_OTHER): Payer: Medicare Other | Admitting: Neurology

## 2017-04-15 ENCOUNTER — Encounter: Payer: Self-pay | Admitting: *Deleted

## 2017-04-15 DIAGNOSIS — R0683 Snoring: Secondary | ICD-10-CM

## 2017-04-15 DIAGNOSIS — G47421 Narcolepsy in conditions classified elsewhere with cataplexy: Secondary | ICD-10-CM

## 2017-04-15 DIAGNOSIS — G47411 Narcolepsy with cataplexy: Secondary | ICD-10-CM | POA: Diagnosis not present

## 2017-04-15 DIAGNOSIS — G4719 Other hypersomnia: Secondary | ICD-10-CM

## 2017-04-15 DIAGNOSIS — R413 Other amnesia: Secondary | ICD-10-CM

## 2017-04-15 DIAGNOSIS — G4733 Obstructive sleep apnea (adult) (pediatric): Secondary | ICD-10-CM

## 2017-04-18 NOTE — Addendum Note (Signed)
Addended by: Larey Seat on: 04/18/2017 06:38 PM   Modules accepted: Orders

## 2017-04-18 NOTE — Procedures (Signed)
PATIENT'S NAME:  [name], [name] DOB:      1946-08-01      MR#:    [id number]     DATE OF RECORDING: 04/15/2017 REFERRING M.D.:  [name] MD Study Performed:   Baseline Polysomnogram HISTORY:   Mr. Hark is a 71 year old male with a history of narcolepsy and obstructive sleep apnea. His sleepiness was barely controlled on Adderall. He does state recently he has noticed he is forgetful. States that he can be in a conversation and forget what he was going to say, however he is able to recall it eventually. He does not notice any other behavior changes associated with his memory. The patient 's sleep apnea is currently not treated because he was unable to afford the CPAP machine.  The patient endorsed the Epworth Sleepiness Scale at 17/24 points.    The patient's weight 214 pounds with a height of 69 (inches), resulting in a BMI of 31.7 kg/m2. The patient's neck circumference measured 17.2 inches.  CURRENT MEDICATIONS: Alfuzosin, Amphetamine, Ferrous Sulfate, Glipizide, Losartan, Metformin, Metoprolol Succinate, Pravastatin, Sitagliptin and Spironolactone   PROCEDURE:  This is a multichannel digital polysomnogram utilizing the SomnoStar 11.2 system.  Electrodes and sensors were applied and monitored per AASM Specifications.   EEG, EOG, Chin and Limb EMG, were sampled at 200 Hz.  ECG, Snore and Nasal Pressure, Thermal Airflow, Respiratory Effort, CPAP Flow and Pressure, Oximetry was sampled at 50 Hz. Digital video and audio were recorded.      BASELINE STUDY ; Lights Out was at 20:46 and Lights On at 04:37.  Total recording time (TRT) was 472 minutes, with a total sleep time (TST) of 416.5 minutes.   The patient's sleep latency was 7 minutes.   REM latency was 61 minutes.  The sleep efficiency was 88.2 %.     SLEEP ARCHITECTURE: WASO (Wake after sleep onset) was 39 minutes.  There were 27 minutes in Stage N1, 343 minutes Stage N2, 0 minutes Stage N3 and 46.5 minutes in Stage REM.  The  percentage of Stage N1 was 6.5%, Stage N2 was 82.4%, Stage N3 was 0% and Stage R (REM sleep) was 11.2%.   RESPIRATORY ANALYSIS:  There were a total of 80 respiratory events:  0 obstructive apneas, 0 central apneas and 0 mixed apneas with a total of 0 apneas and an apnea index (AI) of 0 /hour. There were 80 hypopneas with a hypopnea index of 11.5 /hour. The patient also had 0 respiratory event related arousals (RERAs).     The total APNEA/HYPOPNEA INDEX (AHI) was 11.5/hour and the total RESPIRATORY DISTURBANCE INDEX was 11.5 /hour.  27 events occurred in REM sleep and 106 events in NREM. The REM AHI was 34.8 /hour, versus a non-REM AHI of 8.6. The patient spent 230.5 minutes of total sleep time in the supine position and 186 minutes in non-supine. The supine AHI was 17.2 versus a non-supine AHI of 4.5.  OXYGEN SATURATION & C02:  The Wake baseline 02 saturation was 98%, with the lowest being 76%. Time spent below 89% saturation equaled 33 minutes.   PERIODIC LIMB MOVEMENTS:   The patient had a total of 31 Periodic Limb Movements.  The Periodic Limb Movement (PLM) index was 4.5 and the PLM Arousal index was 1.2/hour. The arousals were noted as: 58 were spontaneous, 8 were associated with PLMs, and 81 were associated with respiratory events.  Audio and video analysis did not show any abnormal or unusual movements, behaviors, phonations or vocalizations.  The patient took one bathroom break .Snoring was noted. EKG was in keeping with normal sinus rhythm (NSR).  Post-study, the patient indicated that sleep was the same as usual.    IMPRESSION:  Very short sleep latency, normal REM latency in this patient with narcolepsy and excessive daytime sleepiness.   1. Mild Obstructive Sleep Apnea (OSA) is still present ( AHI was 11.5/hr.) , but associated with oxygen desaturation, total desaturation time exceeded 30 minutes.  2. Mild Periodic Limb Movement Disorder  (PLMD) 3. Snoring  RECOMMENDATIONS:  1. Advise full-night, attended, CPAP titration study to optimize therapy. CPAP therapy is preferred in REM dependent sleep apnea and apnea with hypoxia.  2. Avoid sedative-hypnotics which may worsen sleep apnea, alcohol and tobacco (as applicable). 3. A follow up appointment will be scheduled in the Sleep Clinic at [facility]. The referring provider will be notified of the results.      I certify that I have reviewed the entire raw data recording prior to the issuance of this report in accordance with the Standards of Accreditation of the American Academy of Sleep Medicine (AASM)   Larey Seat, MD     [phone]- 2018 Diplomat, American Board of Psychiatry and Neurology  Diplomat, American Board of Sleep Medicine Market researcher, [employer] [facility] [employer]

## 2017-04-19 ENCOUNTER — Telehealth: Payer: Self-pay | Admitting: Neurology

## 2017-04-19 NOTE — Telephone Encounter (Signed)
I called pt. I advised pt that Dr. Brett Fairy reviewed their sleep study results and found that pt has mild OSA. Dr. Brett Fairy recommends that pt come back in for cpap titration study. I informed the patient to expect a phone call from the sleep lab to get that set up. Pt verbalized understanding of results. Pt had no questions at this time but was encouraged to call back if questions arise.

## 2017-04-19 NOTE — Telephone Encounter (Signed)
-----   Message from Larey Seat, MD sent at 04/18/2017  6:38 PM EDT -----   Very short sleep latency, normal REM latency in this  patient with narcolepsy and excessive daytime sleepiness.  1. Mild Obstructive Sleep Apnea (OSA) is still present ( AHI was  11.5/hr.) , but associated with oxygen desaturation, total  desaturation time exceeded 30 minutes.   Advise full-night, attended, CPAP titration study to optimize  therapy. CPAP therapy is preferred in REM dependent sleep apnea  and apnea with hypoxia.  I ordered return study. CD

## 2017-04-25 ENCOUNTER — Ambulatory Visit: Payer: Medicare Other | Admitting: Family Medicine

## 2017-04-25 ENCOUNTER — Telehealth: Payer: Self-pay

## 2017-04-25 ENCOUNTER — Other Ambulatory Visit: Payer: Self-pay

## 2017-04-25 DIAGNOSIS — E1165 Type 2 diabetes mellitus with hyperglycemia: Secondary | ICD-10-CM

## 2017-04-25 MED ORDER — BLOOD GLUCOSE METER KIT: PACK | 0 refills | Status: DC

## 2017-04-25 NOTE — Telephone Encounter (Signed)
Rx for diabetic test kit given to pt in person at [address] office. Pharmacy would not accept fax sent and needed physical copy of rx.

## 2017-05-02 ENCOUNTER — Telehealth: Payer: Self-pay | Admitting: Family Medicine
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
## 2017-05-03 ENCOUNTER — Ambulatory Visit: Payer: Medicare Other | Admitting: Family Medicine

## 2017-05-11 NOTE — Telephone Encounter (Signed)
error 

## 2017-05-14 ENCOUNTER — Telehealth: Payer: Self-pay

## 2017-05-14 NOTE — Telephone Encounter (Signed)
Called pt to discuss request for community resources. Nurse Health Advisor at [place] recently completed AWV with pt and he expressed concerns affording several medications. Spoke with pt who agreed to referral for [employer] services for assistance and requested that I place this referral for him.    Josepha Pigg, B.A.  Care Guide [phone]

## 2017-05-16 ENCOUNTER — Telehealth: Payer: Self-pay

## 2017-05-16 ENCOUNTER — Other Ambulatory Visit: Payer: Self-pay

## 2017-05-16 ENCOUNTER — Ambulatory Visit: Payer: Medicare Other | Admitting: Family Medicine

## 2017-05-16 DIAGNOSIS — E119 Type 2 diabetes mellitus without complications: Secondary | ICD-10-CM

## 2017-05-16 NOTE — Telephone Encounter (Signed)
Call to patient to leave a urine collection for microalbumin.  Pt states he has an appointment with Dr. Carlota Raspberry tomorrow for a diabetic check.  Advised patient to leave a urine specimen at that time.  He stated he would do so.  I will have Dewitt Hoes, RN place the order.

## 2017-05-17 ENCOUNTER — Ambulatory Visit (INDEPENDENT_AMBULATORY_CARE_PROVIDER_SITE_OTHER): Payer: Medicare Other | Admitting: Family Medicine

## 2017-05-17 ENCOUNTER — Other Ambulatory Visit: Payer: Self-pay

## 2017-05-17 ENCOUNTER — Ambulatory Visit (INDEPENDENT_AMBULATORY_CARE_PROVIDER_SITE_OTHER): Payer: Medicare Other | Admitting: Neurology

## 2017-05-17 DIAGNOSIS — G47411 Narcolepsy with cataplexy: Secondary | ICD-10-CM

## 2017-05-17 DIAGNOSIS — E119 Type 2 diabetes mellitus without complications: Secondary | ICD-10-CM

## 2017-05-17 DIAGNOSIS — R0683 Snoring: Secondary | ICD-10-CM

## 2017-05-17 DIAGNOSIS — G4733 Obstructive sleep apnea (adult) (pediatric): Secondary | ICD-10-CM

## 2017-05-17 DIAGNOSIS — R413 Other amnesia: Secondary | ICD-10-CM

## 2017-05-17 DIAGNOSIS — G4719 Other hypersomnia: Secondary | ICD-10-CM

## 2017-05-17 DIAGNOSIS — C220 Liver cell carcinoma: Secondary | ICD-10-CM

## 2017-05-17 NOTE — Progress Notes (Signed)
Lab visit

## 2017-05-17 NOTE — Patient Outreach (Signed)
[facility]) Care Management  05/17/2017  [name] Jan 15, 1946 [id number]   TELEPHONE SCREENING Referral date: 05/17/17 Referral source: primary MD Referral reason: Medication assistance Insurance: [employer]  SUBJECTIVE: Telephone call to patient regarding primary MD referral. HIPAA verified. Discussed and offered [facility] care management services. Patient verbally agreed.   MEDICATIONS: Patient states he is not taking all of his medications as prescribed because he is unable to afford them. Patient states he is not able to afford his Januvia,Adderall, and Aricept.   DIABETES: Patient states he has had diabetes for approximately 10-13 years. He reports his blood sugars ranging from 78 to 180's fasting. Patient states he is unsure of what his A1c is and the goal his physician has set for him.  Patient states he has a follow up appointment scheduled with his primary MD on 05/20/17.   PROVIDERS: Dr. Corliss Parish  SOCIAL/ SUPPORTIVE CARE: Patient lives alone and is independent.   ASSESSMENT; Patient will benefit from referral to health coach for diabetic education. Patient will benefit from referral to pharmacist for medication assistance.   PLAN:  RNCM will refer patient to health coach and pharmacist.   Quinn Plowman RN,BSN,CCM [facility] Telephonic  [phone]

## 2017-05-18 LAB — MICROALBUMIN, URINE: Microalbumin, Urine: <3 ug/mL

## 2017-05-20 ENCOUNTER — Ambulatory Visit (INDEPENDENT_AMBULATORY_CARE_PROVIDER_SITE_OTHER): Payer: Medicare Other | Admitting: Family Medicine

## 2017-05-20 ENCOUNTER — Encounter: Payer: Self-pay | Admitting: Family Medicine

## 2017-05-20 VITALS — BP 118/56 | HR 77 | Temp 98.4°F | Resp 16 | Ht 69.0 in | Wt 214.4 lb

## 2017-05-20 DIAGNOSIS — Z23 Encounter for immunization: Secondary | ICD-10-CM | POA: Diagnosis not present

## 2017-05-20 DIAGNOSIS — R519 Headache, unspecified: Secondary | ICD-10-CM

## 2017-05-20 DIAGNOSIS — G629 Polyneuropathy, unspecified: Secondary | ICD-10-CM | POA: Diagnosis not present

## 2017-05-20 DIAGNOSIS — R51 Headache: Secondary | ICD-10-CM | POA: Diagnosis not present

## 2017-05-20 DIAGNOSIS — E119 Type 2 diabetes mellitus without complications: Secondary | ICD-10-CM

## 2017-05-20 LAB — POCT SEDIMENTATION RATE: POCT SED RATE: 27 mm/hr — AB (ref 0–22)

## 2017-05-20 NOTE — Patient Instructions (Addendum)
For diabetes, I do not think you need Januvia based on last A1c and recent home readings. Recheck in 1 month with readings again to make sure that metformin and glipizide at current doses are ok. Continue the pill minder or pill box to make sure you do not take too many pills or miss doses. If any low blood sugars, then return sooner.   Tingling in feet may be due to diabetes.  If that is more frequent or uncomfortable, we can try gabapentin at bedtime. Let me know.   For headache, I will check a blood test for one type of headache today. Tylenol over the counter if needed, but if headaches continue this week, I would like to order a CT scan and have you follow up with your neurologist to discuss those symptoms further. Let me know if those headaches persist. Return to the clinic or go to the nearest emergency room if any of your symptoms worsen or new symptoms occur.  General Headache Without Cause A headache is pain or discomfort felt around the head or neck area. The specific cause of a headache may not be found. There are many causes and types of headaches. A few common ones are:  Tension headaches.  Migraine headaches.  Cluster headaches.  Chronic daily headaches.  Follow these instructions at home: Watch your condition for any changes. Take these steps to help with your condition: Managing pain  Take over-the-counter and prescription medicines only as told by your health care provider.  Lie down in a dark, quiet room when you have a headache.  If directed, apply ice to the head and neck area: ? Put ice in a plastic bag. ? Place a towel between your skin and the bag. ? Leave the ice on for 20 minutes, 2-3 times per day.  Use a heating pad or hot shower to apply heat to the head and neck area as told by your health care provider.  Keep lights dim if bright lights bother you or make your headaches worse. Eating and drinking  Eat meals on a regular schedule.  Limit alcohol  use.  Decrease the amount of caffeine you drink, or stop drinking caffeine. General instructions  Keep all follow-up visits as told by your health care provider. This is important.  Keep a headache journal to help find out what may trigger your headaches. For example, write down: ? What you eat and drink. ? How much sleep you get. ? Any change to your diet or medicines.  Try massage or other relaxation techniques.  Limit stress.  Sit up straight, and do not tense your muscles.  Do not use tobacco products, including cigarettes, chewing tobacco, or e-cigarettes. If you need help quitting, ask your health care provider.  Exercise regularly as told by your health care provider.  Sleep on a regular schedule. Get 7-9 hours of sleep, or the amount recommended by your health care provider. Contact a health care provider if:  Your symptoms are not helped by medicine.  You have a headache that is different from the usual headache.  You have nausea or you vomit.  You have a fever. Get help right away if:  Your headache becomes severe.  You have repeated vomiting.  You have a stiff neck.  You have a loss of vision.  You have problems with speech.  You have pain in the eye or ear.  You have muscular weakness or loss of muscle control.  You lose your balance or  have trouble walking.  You feel faint or pass out.  You have confusion. This information is not intended to replace advice given to you by your health care provider. Make sure you discuss any questions you have with your health care provider. Document Released: 08/06/2005 Document Revised: 01/12/2016 Document Reviewed: 11/29/2014 Elsevier Interactive Patient Education  2017 [employer]    Peripheral Neuropathy Peripheral neuropathy is a type of nerve damage. It affects nerves that carry signals between the spinal cord and other parts of the body. These are called peripheral nerves. With peripheral neuropathy,  one nerve or a group of nerves may be damaged. What are the causes? Many things can damage peripheral nerves. For some people with peripheral neuropathy, the cause is unknown. Some causes include:  Diabetes. This is the most common cause of peripheral neuropathy.  Injury to a nerve.  Pressure or stress on a nerve that lasts a long time.  Too little vitamin B. Alcoholism can lead to this.  Infections.  Autoimmune diseases, such as multiple sclerosis and systemic lupus erythematosus.  Inherited nerve diseases.  Some medicines, such as cancer drugs.  Toxic substances, such as lead and mercury.  Too little blood flowing to the legs.  Kidney disease.  Thyroid disease.  What are the signs or symptoms? Different people have different symptoms. The symptoms you have will depend on which of your nerves is damaged. Common symptoms include:  Loss of feeling (numbness) in the feet and hands.  Tingling in the feet and hands.  Pain that burns.  Very sensitive skin.  Weakness.  Not being able to move a part of the body (paralysis).  Muscle twitching.  Clumsiness or poor coordination.  Loss of balance.  Not being able to control your bladder.  Feeling dizzy.  Sexual problems.  How is this diagnosed? Peripheral neuropathy is a symptom, not a disease. Finding the cause of peripheral neuropathy can be hard. To figure that out, your health care provider will take a medical history and do a physical exam. A neurological exam will also be done. This involves checking things affected by your brain, spinal cord, and nerves (nervous system). For example, your health care provider will check your reflexes, how you move, and what you can feel. Other types of tests may also be ordered, such as:  Blood tests.  A test of the fluid in your spinal cord.  Imaging tests, such as CT scans or an MRI.  Electromyography (EMG). This test checks the nerves that control muscles.  Nerve  conduction velocity tests. These tests check how fast messages pass through your nerves.  Nerve biopsy. A small piece of nerve is removed. It is then checked under a microscope.  How is this treated?  Medicine is often used to treat peripheral neuropathy. Medicines may include: ? Pain-relieving medicines. Prescription or over-the-counter medicine may be suggested. ? Antiseizure medicine. This may be used for pain. ? Antidepressants. These also may help ease pain from neuropathy. ? Lidocaine. This is a numbing medicine. You might wear a patch or be given a shot. ? Mexiletine. This medicine is typically used to help control irregular heart rhythms.  Surgery. Surgery may be needed to relieve pressure on a nerve or to destroy a nerve that is causing pain.  Physical therapy to help movement.  Assistive devices to help movement. Follow these instructions at home:  Only take over-the-counter or prescription medicines as directed by your health care provider. Follow the instructions carefully for any given medicines.  Do not take any other medicines without first getting approval from your health care provider.  If you have diabetes, work closely with your health care provider to keep your blood sugar under control.  If you have numbness in your feet: ? Check every day for signs of injury or infection. Watch for redness, warmth, and swelling. ? Wear padded socks and comfortable shoes. These help protect your feet.  Do not do things that put pressure on your damaged nerve.  Do not smoke. Smoking keeps blood from getting to damaged nerves.  Avoid or limit alcohol. Too much alcohol can cause a lack of B vitamins. These vitamins are needed for healthy nerves.  Develop a good support system. Coping with peripheral neuropathy can be stressful. Talk to a mental health specialist or join a support group if you are struggling.  Follow up with your health care provider as directed. Contact a health  care provider if:  You have new signs or symptoms of peripheral neuropathy.  You are struggling emotionally from dealing with peripheral neuropathy.  You have a fever. Get help right away if:  You have an injury or infection that is not healing.  You feel very dizzy or begin vomiting.  You have chest pain.  You have trouble breathing. This information is not intended to replace advice given to you by your health care provider. Make sure you discuss any questions you have with your health care provider. Document Released: 07/27/2002 Document Revised: 01/12/2016 Document Reviewed: 04/13/2013 Elsevier Interactive Patient Education  2017 [employer].    IF you received an x-ray today, you will receive an invoice from [facility]. Please contact [facility] at [phone] with questions or concerns regarding your invoice.   IF you received labwork today, you will receive an invoice from [facility]. Please contact [facility] at [phone] with questions or concerns regarding your invoice.   Our billing staff will not be able to assist you with questions regarding bills from these companies.  You will be contacted with the lab results as soon as they are available. The fastest way to get your results is to activate your My Chart account. Instructions are located on the last page of this paperwork. If you have not heard from [place] regarding the results in 2 weeks, please contact this office.

## 2017-05-20 NOTE — Addendum Note (Signed)
Addended by: Larey Seat on: 05/20/2017 05:40 PM   Modules accepted: Orders

## 2017-05-20 NOTE — Progress Notes (Signed)
Subjective:  By signing my name below, I, [name], attest that this documentation has been prepared under the direction and in the presence of Edward Agreste, MD Electronically Signed: Ladene [name], ED Scribe 05/20/2017 at 12:00 PM.   Patient ID: [name], male    DOB: 08/02/1946, 71 y.o.   MRN: [id number]  Chief Complaint  Patient presents with  . Follow-up    TIIDM, feels a stinging feeling in his legs and feet sometimes  . Headache    constant this weekend    HPI [name] is a 71 y.o. male who presents to Primary Care at [facility] for follow-up. Last seen 8/16 for multiple concerns including diabetes, fatigue and memory changes.   Fatigue Recommended he f/u sleep specialist due to untreated sleep apnea. There was possible inconsistency with meds. Pill minder/box was discussed and advised to return in 2 weeks. I have not seen him since that visit. Appears he had a c-pap titration with Dr. Brett [name] 9/28. Hgb was 10.4 on 8/9. Pt plans to start using the c-pap machine once he receives his settings.   Type II DM A1C 5.6 at last visit, down from 8.9 4 months ago. There was concern about him not taking his meds as prescribed, possible too low. Pill minder/box was discussed. Advised him to f/u in 2 weeks with medications. Also advised him to return with home readings. Takes Metformin 1000 mg bid, Januvia 100 mg qd, glipizide 10 mg bid. However, he states that he has not been taking Januvia due to it being to costly. Home blood sugars range from 79-278, most recently in the 100s, 109 yesterday and 110 this morning. Pt does report intermittent tingling on the top of his right foot that radiates upward into his lower right leg, rarely in the lower left leg. Pt experiences tingling sensation at least once daily. Denies tingling in his hands.   Memory Advised him to f/u with neuro for a more formal mental status testing. MMSE was 21/30. Planned for OSA treatment to see if  that helps. Epworth and MMSE in 3 months. That was disused 8/21.   HA Pt presents with an intermittent L temple HA for 2 weeks, constant for the past 2-3 days. Pt states that HA today is better than yesterday. No medications tried. States he does not usually have HAs. Denies visual disturbances, nausea, vomiting, slurred speech, facial droop, weakness in extremities. Dr. Brett [name] is unaware of HAs as they started after his visit.   Patient Active Problem List   Diagnosis Date Noted  . Excessive daytime sleepiness 04/09/2017  . Snoring 04/09/2017  . Memory impairment of gradual onset 04/09/2017  . Aortic atherosclerosis ([place]) 11/27/2016  . 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 Altered mental status 03/05/2013  . Respiratory failure, post-operative ([place]) 03/02/2013  . Hepatitis B 02/24/2013  . Cancer, hepatocellular ([place]) 02/10/2013  . Liver tumor-bleeding 01/15/2013  . Epidermal cyst 06/18/2012  . OSA (obstructive sleep apnea) 03/13/2012  . Abnormal leg movement 12/12/2011  . Blood pressure elevated 09/06/2011  . Diabetes mellitus ([place]) 09/06/2011  . 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 BPH (benign prostatic hyperplasia) 09/06/2011  . Diverticula of colon 09/06/2011   Past Medical History:  Diagnosis Date  . Cancer [facility])    hepatocellular cancer   . Diabetes mellitus without complication ([place])   . Hepatitis   . Hyperlipidemia   . Hypertension   . Narcolepsy    per office visit note of 08/2011   .  Sleep apnea    Past Surgical History:  Procedure Laterality Date  . LAPAROSCOPY  03/02/2013   Procedure: LAPAROSCOPY DIAGNOSTIC;  Surgeon: Edward Klein, MD;  Location: [facility] ORS;  Service: General;;  . LIVER ULTRASOUND  03/02/2013   Procedure: LIVER ULTRASOUND;  Surgeon: Edward Klein, MD;  Location: [facility];  Service: General;;  . OPEN PARTIAL HEPATECTOMY   03/02/2013   Procedure: OPEN PARTIAL HEPATECTOMY [83];  Surgeon: Edward Klein, MD;  Location: [facility]S;  Service:  General;;  DX LAPAROSCOPY, INTRAOPERATIVE LIVER ULTRASOUND, OPEN PARTIAL HEPATECTOMY  . pinched nerve in back     Allergies  Allergen Reactions  . Ace Inhibitors Swelling    Angioedema - face.    Prior to Admission medications   Medication Sig Start Date End Date Taking? Authorizing Provider  alfuzosin (UROXATRAL) 10 MG 24 hr tablet Take 10 mg by mouth at bedtime.  12/19/12   [provider]  amphetamine-dextroamphetamine (ADDERALL XR) 30 MG 24 hr capsule Take 1 capsule (30 mg total) by mouth every morning. 04/09/17   Dohmeier, Edward Partridge, MD  blood glucose meter kit and supplies Dispense based on patient and insurance preference. Use up to four times daily as directed. (FOR ICD-9 250.00, 250.01). 04/25/17   Edward Agreste, MD  donepezil (ARICEPT) 5 MG tablet Take 1 tablet (5 mg total) by mouth at bedtime. 04/09/17   Dohmeier, Edward Partridge, MD  ferrous sulfate (KP FERROUS SULFATE) 325 (65 FE) MG tablet Take 325 mg by mouth 3 (three) times daily with meals.     [provider]  glipiZIDE (GLUCOTROL) 10 MG tablet TAKE 1 TABLET BY MOUTH  TWICE A DAY BEFORE MEALS 02/08/17   Edward Agreste, MD  losartan (COZAAR) 25 MG tablet Take 1 tablet (25 mg total) by mouth daily. 01/05/17   Edward Agreste, MD  metFORMIN (GLUCOPHAGE) 1000 MG tablet Take 1 tablet (1,000 mg total) by mouth 2 (two) times daily with a meal. 01/05/17   Edward Agreste, MD  metoprolol succinate (TOPROL-XL) 50 MG 24 hr tablet Take 1 tablet (50 mg total) by mouth daily. Take with or immediately following a meal. 01/05/17   Edward Agreste, MD  pravastatin (PRAVACHOL) 20 MG tablet Take 1 tablet (20 mg total) by mouth at bedtime. 01/05/17   Edward Agreste, MD  sitaGLIPtin (JANUVIA) 100 MG tablet Take 1 tablet (100 mg total) by mouth daily. 01/05/17   Edward Agreste, MD  spironolactone (ALDACTONE) 25 MG tablet Take 1 tablet (25 mg total) by mouth 2 (two) times daily. 01/05/17   Edward Agreste, MD   Social History    Social History  . Marital status: Single    Spouse name: N/A  . Number of children: 2  . Years of education: 15+   Occupational History  . Not on file.   Social History Main Topics  . Smoking status: Former Smoker    Years: 1.00  . Smokeless tobacco: Never Used  . Alcohol use No  . Drug use: No  . Sexual activity: Yes   Other Topics Concern  . Not on file   Social History Narrative   Patient is single and lives alone- became widower when 1 child was 20 year old, divorced 2nd marriage   Has #2 grown children, not in area or involved in his life   Patient is retired.   Patient has a college education.   Patient is right-handed.   Patient drinks maybe two sodas daily.   Review  of Systems  Eyes: Negative for visual disturbance.  Gastrointestinal: Negative for nausea and vomiting.  Neurological: Positive for numbness (intermittent tingling in feet) and headaches. Negative for facial asymmetry, speech difficulty and weakness.      Objective:   Physical Exam  Constitutional: He is oriented to person, place, and time. He appears well-developed and well-nourished.  HENT:  Head: Normocephalic and atraumatic.  Temple nontender.  Eyes: Pupils are equal, round, and reactive to light. EOM are normal.  Neck: No JVD present. Carotid bruit is not present.  Cardiovascular: Normal rate, regular rhythm, normal heart sounds and intact distal pulses.   No murmur heard. DP pulse intact.  Pulmonary/Chest: Effort normal and breath sounds normal. He has no rales.  Musculoskeletal: He exhibits no edema.  Toes are warm. Cap refill less than 1 second.   Neurological: He is alert and oriented to person, place, and time.  No pronator drift. No facial droop. Non-focal neuro exam.   Skin: Skin is warm and dry.  Psychiatric: He has a normal mood and affect.  Vitals reviewed.  Vitals:   05/20/17 1103  BP: (!) 118/56  Pulse: 77  Resp: 16  Temp: 98.4 F (36.9 C)  SpO2: 99%  Weight: 214 lb  6.4 oz (97.3 kg)  Height: '5\' 9"'  (1.753 m)   Results for orders placed or performed in visit on 05/20/17  Vitamin B12  Result Value Ref Range   Vitamin B-12 266 232 - 1,245 pg/mL  TSH  Result Value Ref Range   TSH 1.360 0.450 - 4.500 uIU/mL  POCT SEDIMENTATION RATE  Result Value Ref Range   POCT SED RATE 27 (A) 0 - 22 mm/hr       Assessment & Plan:    JAELYN CLONINGER is a 70 y.o. male Type 2 diabetes mellitus without complication, without long-term current use of insulin ([place])  -Reassuring last A1c, and given that level do not think he needs Januvia. Planned for recheck A1c at next visit. Hypoglycemia precautions discussed with current regimen.  -Episodic pain in feet, suspected diabetic neuropathy. See below  Need for prophylactic vaccination and inoculation against influenza - Plan: Flu Vaccine QUAD 6+ mos PF IM (Fluarix Quad PF)  Nonintractable episodic headache, unspecified headache type Left temporal headache - Plan: POCT SEDIMENTATION RATE  -Episodic headache, worsening Past weekend without other focal neurologic symptoms, but now improving. Sedimentation rate reassuring. Discussed potential neuroimaging if headache did not continue to improve during the week, RTC/ER precautions if worse.  Peripheral polyneuropathy - Plan: Vitamin B12, TSH  -TSH, B12 normal. Suspected diabetic neuropathy. Intermittent symptoms, discussed gabapentin, but he would like to wait on medications at this time unless more symptomatic.  Meds ordered this encounter  Medications  . ACCU-CHEK SOFTCLIX LANCETS lancets  . ACCU-CHEK AVIVA PLUS test strip  . Blood Glucose Monitoring Suppl (ACCU-CHEK AVIVA PLUS) w/Device KIT   Patient Instructions   For diabetes, I do not think you need Januvia based on last A1c and recent home readings. Recheck in 1 month with readings again to make sure that metformin and glipizide at current doses are ok. Continue the pill minder or pill box to make sure you do  not take too many pills or miss doses. If any low blood sugars, then return sooner.   Tingling in feet may be due to diabetes.  If that is more frequent or uncomfortable, we can try gabapentin at bedtime. Let me know.   For headache, I will check a blood test  for one type of headache today. Tylenol over the counter if needed, but if headaches continue this week, I would like to order a CT scan and have you follow up with your neurologist to discuss those symptoms further. Let me know if those headaches persist. Return to the clinic or go to the nearest emergency room if any of your symptoms worsen or new symptoms occur.  General Headache Without Cause A headache is pain or discomfort felt around the head or neck area. The specific cause of a headache may not be found. There are many causes and types of headaches. A few common ones are:  Tension headaches.  Migraine headaches.  Cluster headaches.  Chronic daily headaches.  Follow these instructions at home: Watch your condition for any changes. Take these steps to help with your condition: Managing pain  Take over-the-counter and prescription medicines only as told by your health care provider.  Lie down in a dark, quiet room when you have a headache.  If directed, apply ice to the head and neck area: ? Put ice in a plastic bag. ? Place a towel between your skin and the bag. ? Leave the ice on for 20 minutes, 2-3 times per day.  Use a heating pad or hot shower to apply heat to the head and neck area as told by your health care provider.  Keep lights dim if bright lights bother you or make your headaches worse. Eating and drinking  Eat meals on a regular schedule.  Limit alcohol use.  Decrease the amount of caffeine you drink, or stop drinking caffeine. General instructions  Keep all follow-up visits as told by your health care provider. This is important.  Keep a headache journal to help find out what may trigger your  headaches. For example, write down: ? What you eat and drink. ? How much sleep you get. ? Any change to your diet or medicines.  Try massage or other relaxation techniques.  Limit stress.  Sit up straight, and do not tense your muscles.  Do not use tobacco products, including cigarettes, chewing tobacco, or e-cigarettes. If you need help quitting, ask your health care provider.  Exercise regularly as told by your health care provider.  Sleep on a regular schedule. Get 7-9 hours of sleep, or the amount recommended by your health care provider. Contact a health care provider if:  Your symptoms are not helped by medicine.  You have a headache that is different from the usual headache.  You have nausea or you vomit.  You have a fever. Get help right away if:  Your headache becomes severe.  You have repeated vomiting.  You have a stiff neck.  You have a loss of vision.  You have problems with speech.  You have pain in the eye or ear.  You have muscular weakness or loss of muscle control.  You lose your balance or have trouble walking.  You feel faint or pass out.  You have confusion. This information is not intended to replace advice given to you by your health care provider. Make sure you discuss any questions you have with your health care provider. Document Released: 08/06/2005 Document Revised: 01/12/2016 Document Reviewed: 11/29/2014 Elsevier Interactive Patient Education  2017 Elsevier Inc.    Peripheral Neuropathy Peripheral neuropathy is a type of nerve damage. It affects nerves that carry signals between the spinal cord and other parts of the body. These are called peripheral nerves. With peripheral neuropathy, one nerve or a  group of nerves may be damaged. What are the causes? Many things can damage peripheral nerves. For some people with peripheral neuropathy, the cause is unknown. Some causes include:  Diabetes. This is the most common cause of  peripheral neuropathy.  Injury to a nerve.  Pressure or stress on a nerve that lasts a long time.  Too little vitamin B. Alcoholism can lead to this.  Infections.  Autoimmune diseases, such as multiple sclerosis and systemic lupus erythematosus.  Inherited nerve diseases.  Some medicines, such as cancer drugs.  Toxic substances, such as lead and mercury.  Too little blood flowing to the legs.  Kidney disease.  Thyroid disease.  What are the signs or symptoms? Different people have different symptoms. The symptoms you have will depend on which of your nerves is damaged. Common symptoms include:  Loss of feeling (numbness) in the feet and hands.  Tingling in the feet and hands.  Pain that burns.  Very sensitive skin.  Weakness.  Not being able to move a part of the body (paralysis).  Muscle twitching.  Clumsiness or poor coordination.  Loss of balance.  Not being able to control your bladder.  Feeling dizzy.  Sexual problems.  How is this diagnosed? Peripheral neuropathy is a symptom, not a disease. Finding the cause of peripheral neuropathy can be hard. To figure that out, your health care provider will take a medical history and do a physical exam. A neurological exam will also be done. This involves checking things affected by your brain, spinal cord, and nerves (nervous system). For example, your health care provider will check your reflexes, how you move, and what you can feel. Other types of tests may also be ordered, such as:  Blood tests.  A test of the fluid in your spinal cord.  Imaging tests, such as CT scans or an MRI.  Electromyography (EMG). This test checks the nerves that control muscles.  Nerve conduction velocity tests. These tests check how fast messages pass through your nerves.  Nerve biopsy. A small piece of nerve is removed. It is then checked under a microscope.  How is this treated?  Medicine is often used to treat peripheral  neuropathy. Medicines may include: ? Pain-relieving medicines. Prescription or over-the-counter medicine may be suggested. ? Antiseizure medicine. This may be used for pain. ? Antidepressants. These also may help ease pain from neuropathy. ? Lidocaine. This is a numbing medicine. You might wear a patch or be given a shot. ? Mexiletine. This medicine is typically used to help control irregular heart rhythms.  Surgery. Surgery may be needed to relieve pressure on a nerve or to destroy a nerve that is causing pain.  Physical therapy to help movement.  Assistive devices to help movement. Follow these instructions at home:  Only take over-the-counter or prescription medicines as directed by your health care provider. Follow the instructions carefully for any given medicines. Do not take any other medicines without first getting approval from your health care provider.  If you have diabetes, work closely with your health care provider to keep your blood sugar under control.  If you have numbness in your feet: ? Check every day for signs of injury or infection. Watch for redness, warmth, and swelling. ? Wear padded socks and comfortable shoes. These help protect your feet.  Do not do things that put pressure on your damaged nerve.  Do not smoke. Smoking keeps blood from getting to damaged nerves.  Avoid or limit alcohol. Too much  alcohol can cause a lack of B vitamins. These vitamins are needed for healthy nerves.  Develop a good support system. Coping with peripheral neuropathy can be stressful. Talk to a mental health specialist or join a support group if you are struggling.  Follow up with your health care provider as directed. Contact a health care provider if:  You have new signs or symptoms of peripheral neuropathy.  You are struggling emotionally from dealing with peripheral neuropathy.  You have a fever. Get help right away if:  You have an injury or infection that is not  healing.  You feel very dizzy or begin vomiting.  You have chest pain.  You have trouble breathing. This information is not intended to replace advice given to you by your health care provider. Make sure you discuss any questions you have with your health care provider. Document Released: 07/27/2002 Document Revised: 01/12/2016 Document Reviewed: 04/13/2013 Elsevier Interactive Patient Education  2017 [employer].    IF you received an x-ray today, you will receive an invoice from [facility]. Please contact [facility] at [phone] with questions or concerns regarding your invoice.   IF you received labwork today, you will receive an invoice from [place] [facility]. Please contact [facility] at [phone] with questions or concerns regarding your invoice.   Our billing staff will not be able to assist you with questions regarding bills from these companies.  You will be contacted with the lab results as soon as they are available. The fastest way to get your results is to activate your My Chart account. Instructions are located on the last page of this paperwork. If you have not heard from [place] regarding the results in 2 weeks, please contact this office.       I personally performed the services described in this documentation, which was scribed in my presence. The recorded information has been reviewed and considered for accuracy and completeness, addended by me as needed, and agree with information above.  Signed,   [name], MD Primary Care at [place].  05/22/17 1:34 PM

## 2017-05-20 NOTE — Procedures (Signed)
PATIENT'S NAME:  [name], [name] DOB:      03/23/1946      MR#:    [id number]     DATE OF RECORDING: 05/17/2017 REFERRING M.D.:  [name], M.D. Study Performed:   Titration to CPAP HISTORY:  [name] is a 71 year old male with a history of narcolepsy and obstructive sleep apnea. His sleepiness was barely controlled on Adderall. He has noticed he is forgetful. He returns following a PSG from 04-16-2007 which revealed an AHI of 11.5/hr., REM AHI of 34.8/hr. and 33 minutes of total hypoxemia.  The patient endorsed the Epworth Sleepiness Scale at 17/ 24 points.   The patient's weight 214 pounds with a height of 69 (inches), resulting in a BMI of 31.7 kg/m2.The patient's neck circumference measured 17 inches.  CURRENT MEDICATIONS: Alfuzosin, Amphetamine, Ferrous Sulfate, Glipizide, Losartan, Metformin, Metoprolol Succinate, Pravastatin, Sitagliptin and Spironolactone   PROCEDURE:  This is a multichannel digital polysomnogram utilizing the SomnoStar 11.2 system.  Electrodes and sensors were applied and monitored per AASM Specifications.   EEG, EOG, Chin and Limb EMG, were sampled at 200 Hz.  ECG, Snore and Nasal Pressure, Thermal Airflow, Respiratory Effort, CPAP Flow and Pressure, Oximetry was sampled at 50 Hz. Digital video and audio were recorded.      CPAP was initiated at 5 cmH20 with heated humidity per AASM split night standards and pressure was advanced to 8 cmH20 because of hypopneas, apneas and desaturations.  At a PAP pressure of 7 cmH20, there was a reduction of the AHI to 0.0 with improvement of obstructive sleep apnea, oxygen nadir increased to 98% and the patient slept 265 minutes with REM sleep.    Lights Out was at 21:49 and Lights On at 05:13. Total recording time (TRT) was 412.5 minutes, with a total sleep time (TST) of 405.5 minutes. The patient's sleep latency was 0 minutes with 0.5 minutes of wake time after sleep onset. REM latency was 10 minutes.  The sleep efficiency  was 98.3 %.    SLEEP ARCHITECTURE: WASO (Wake after sleep onset) was 7 minutes.  There were 3.5 minutes in Stage N1, 285.5 minutes Stage N2, 53.5 minutes Stage N3 and 89 minutes in Stage REM.  The percentage of Stage N1 was .8%, Stage N2 was 66.2%, Stage N3 was 12.4% and Stage R (REM sleep) was 20.6%.    RESPIRATORY ANALYSIS:  There was a total of 10 respiratory events: 0 obstructive apneas, 2 central apneas and 1 mixed apneas with a total of 3 apneas and an apnea index (AI) of .4 /hour. There were 7 hypopneas with a hypopnea index of 1./hour. The patient also had 0 respiratory event related arousals (RERAs).      The total APNEA/HYPOPNEA INDEX  (AHI) was 1.5 /hour and the total RESPIRATORY DISTURBANCE INDEX was 1.5 .hour  4 events occurred in REM sleep and 6 events in NREM. The REM AHI was 2.7 /hour versus a non-REM AHI of 1.1 /hour.  The patient spent 0 minutes of total sleep time in the supine position and 432 minutes in non-supine. The supine AHI was 0.0, versus a non-supine AHI of 1.4.  OXYGEN SATURATION & C02:  The baseline 02 saturation was 0%, with the lowest being 90%. Time spent below 89% saturation equaled 0 minutes.  PERIODIC LIMB MOVEMENTS:    The patient had a total of 34 Periodic Limb Movements.  The arousals were noted as: 19 were spontaneous, 7 were associated with PLMs, and 0 were associated with respiratory events.  Audio and video analysis did not show any abnormal or unusual movements, behaviors, phonations or vocalizations. Snoring was noted. No nocturia, no cardiac arrhythmia recorded. EKG was in keeping with normal sinus rhythm (NSR). The Periodic Limb Movement (PLM) index was 5.0 and the PLM Arousal index was 0.9 /hour.  Post-study, the patient indicated that sleep was better than usual.  DIAGNOSIS Mild Obstructive Sleep Apnea with hypoxemia responding to low pressure CPAP at 7 and 8 cm water. The patient was fitted with a medium sized Eson nasal  mask.  PLANS/RECOMMENDATIONS: start CPAP at 8 cm water with 2 cm EPR and heated humidity, using an ESON medium sized mask.  a. Avoidance of medications with muscle relaxant properties, as applicable . b. Avoidance of ingestion of alcohol prior to sleeping, as indicated. c. Avoiding sleeping in the supine position (on one's back). d. Improvement of nasal patency if indicated. e. Avoidance of driving if or when sleepy.   A follow up appointment will be scheduled in the Sleep Clinic at [facility].   Please call [phone] with any questions.      I certify that I have reviewed the entire raw data recording prior to the issuance of this report in accordance with the Standards of Accreditation of the American Academy of Sleep Medicine (AASM)    Larey Seat, M.D.   05-20-2017  Diplomat, American Board of Psychiatry and Neurology  Diplomat, [place] of Sleep Medicine Medical Director, [place] [facility] at [facility]

## 2017-05-21 LAB — TSH: TSH: 1.36 u[IU]/mL (ref 0.450–4.500)

## 2017-05-21 LAB — VITAMIN B12: Vitamin B-12: 266 pg/mL (ref 232–1245)

## 2017-05-22 ENCOUNTER — Telehealth: Payer: Self-pay | Admitting: Neurology

## 2017-05-22 NOTE — Telephone Encounter (Signed)
I called pt. I advised pt that Dr. Brett Fairy reviewed their sleep study results and found that pt has sleep apnea. Dr. Brett Fairy recommends that pt starts CPAP. I reviewed PAP compliance expectations with the pt. Pt is agreeable to starting a CPAP. I advised pt that an order will be sent to a DME, [facility], and [facility] will call the pt within about one week after they file with the pt's insurance. [facility] will show the pt how to use the machine, fit for masks, and troubleshoot the CPAP if needed. A follow up appt was made for insurance purposes with Dr. Brett Fairy on Aug 14, 2017 at 8:30 am. Pt verbalized understanding to arrive 15 minutes early and bring their CPAP. A letter with all of this information in it will be mailed to the pt as a reminder. I verified with the pt that the address we have on file is correct. Pt verbalized understanding of results. Pt had no questions at this time but was encouraged to call back if questions arise.

## 2017-05-22 NOTE — Telephone Encounter (Signed)
-----   Message from Larey Seat, MD sent at 05/20/2017  5:40 PM EDT ----- OSA responded well to CPAP and the patient reported to have slept very well - successful titration treated OSA, hypoxia and snoring, while using a nasal mask ESON in medium. I wrote the orders for DME. CD

## 2017-05-23 ENCOUNTER — Other Ambulatory Visit: Payer: Self-pay | Admitting: Pharmacist

## 2017-05-23 NOTE — Patient Outreach (Signed)
[facility]) Care Management  [place]   05/23/2017  RODRIGUES URBANEK June 13, 1946 [id number]  Subjective:  Patient referred to [place] Pharmacist by [facility] RN Davina for medication patient assistance needs, specifically for Januvia, Adderall, donepezil.     Successful phone outreach to patient, HIPAA details verified, purpose of call explained and he agreed to call.   Patient has past medical history which includes, obstructive sleep apnea, diabetes mellitus, BPH, hypertension, narcolepsy.    Patient states he typically gets his medications from mail order due to cost savings.  He reports PCP stopped sitagliptin at 05/20/17 office visit.    Objective:   Current Medications: Current Outpatient Prescriptions  Medication Sig Dispense Refill  . ACCU-CHEK AVIVA PLUS test strip     . ACCU-CHEK SOFTCLIX LANCETS lancets     . alfuzosin (UROXATRAL) 10 MG 24 hr tablet Take 10 mg by mouth at bedtime.     [name] Kitchen amphetamine-dextroamphetamine (ADDERALL XR) 30 MG 24 hr capsule Take 1 capsule (30 mg total) by mouth every morning. 90 capsule 0  . blood glucose meter kit and supplies Dispense based on patient and insurance preference. Use up to four times daily as directed. (FOR ICD-9 250.00, 250.01). 1 each 0  . Blood Glucose Monitoring Suppl (ACCU-CHEK AVIVA PLUS) w/Device KIT     . donepezil (ARICEPT) 5 MG tablet Take 1 tablet (5 mg total) by mouth at bedtime. 30 tablet 2  . ferrous sulfate (KP FERROUS SULFATE) 325 (65 FE) MG tablet Take 325 mg by mouth 3 (three) times daily with meals.     [name] Kitchen glipiZIDE (GLUCOTROL) 10 MG tablet TAKE 1 TABLET BY MOUTH  TWICE A DAY BEFORE MEALS 180 tablet 0  . losartan (COZAAR) 25 MG tablet Take 1 tablet (25 mg total) by mouth daily. 90 tablet 1  . metFORMIN (GLUCOPHAGE) 1000 MG tablet Take 1 tablet (1,000 mg total) by mouth 2 (two) times daily with a meal. 180 tablet 1  . metoprolol succinate (TOPROL-XL) 50 MG 24 hr tablet Take 1 tablet (50 mg  total) by mouth daily. Take with or immediately following a meal. 90 tablet 1  . pravastatin (PRAVACHOL) 20 MG tablet Take 1 tablet (20 mg total) by mouth at bedtime. 90 tablet 1  . sitaGLIPtin (JANUVIA) 100 MG tablet Take 1 tablet (100 mg total) by mouth daily. 90 tablet 1  . spironolactone (ALDACTONE) 25 MG tablet Take 1 tablet (25 mg total) by mouth 2 (two) times daily. 180 tablet 1   No current facility-administered medications for this visit.     Functional Status: In your present state of health, do you have any difficulty performing the following activities: 04/04/2017  Hearing? Y  Comment wears hearing aid   Vision? N  Difficulty concentrating or making decisions? Y  Walking or climbing stairs? N  Dressing or bathing? N  Doing errands, shopping? N  Preparing Food and eating ? N  Using the Toilet? N  In the past six months, have you accidently leaked urine? N  Do you have problems with loss of bowel control? N  Managing your Medications? N  Managing your Finances? N  Housekeeping or managing your Housekeeping? N  Some recent data might be hidden    Fall/Depression Screening: Fall Risk  05/20/2017 04/04/2017 04/04/2017  Falls in the past year? Yes Yes Yes  Number falls in past yr: '1 1 1  ' Injury with Fall? Yes Yes Yes  Risk Factor Category  - High Fall Risk  High Fall Risk  Risk for fall due to : - - Other (Comment)  Risk for fall due to: Comment - - tripped over the sidewalk   Follow up - - Education provided   [facility] 2/9 Scores 05/20/2017 05/17/2017 04/04/2017 04/04/2017 02/08/2017 02/08/2017 01/05/2017  PHQ - 2 Score 0 2 0 0 0 0 0  PHQ- 9 Score - 6 - - - - -    Assessment:  Medication review per patient report and medication list in this chart.     Drugs sorted by system:  Neurologic/Psychologic: -Adderall XR  -donepezil   Cardiovascular: -losartan  -metoprolol succinate  -pravastatin  -spironolactone  -aspirin---per patient  report  Endocrine: -glipizide -metformin  -sitagliptin (Januvia)  Per 05/20/17 PCP office note, currently stopped.    Vitamins/Minerals: -ferrous sulfate   Miscellaneous: -alfuzosin   Other issues noted:  -suggest close monitoring of potassium with losartan and spironolactone---last potassium 01/05/17 was 4.2  -increased risk of hypoglycemia with use of glipizide---suggest patient monitor blood glucose closely  Medication assistance:  Placed call to [facility] as patient not sure if he filled Adderall XR locally or via mail order---[facility] reports not filling it.    Discussed with patient, if sitagliptin is resumed, manufacturer patient assistance program could be evaluated for him.   Discussed mail order cost savings for his Tier 1 and Tier 2 medications ($0/90 days via [facility] mail order until he reaches coverage gap).    No patient assistance for donepezil as it is generic, Tier 1 on his insurance plan currently.    Discussed generic Adderall XR is Tier 4 on his insurance plan---prescriber can request a Tier Exception to Medicare Part D plan to see if plan will approve coverage at a lower co-pay tier---there is no guarantee plan will approve this.    Plan:  Will route note to PCP.    Will fax Tier Exception paperwork to neurology, Dr Dohmeier's office for patient.    Will place follow-up call to patient in the next 2 weeks.   Karrie Meres, PharmD, [place] [phone]

## 2017-05-27 ENCOUNTER — Other Ambulatory Visit: Payer: Self-pay | Admitting: *Deleted

## 2017-05-27 ENCOUNTER — Encounter: Payer: Self-pay | Admitting: *Deleted

## 2017-05-27 NOTE — Patient Outreach (Signed)
[facility]) Care Management  [place]  05/27/2017   DURREL MCNEE 1946-06-26 [id number]  RN initial telephone assessment outreach call completed with patient.  HIPAA verified.  Patient reports being diagnosed with Diabetes 14 years ago.  States he received general diabetes education at that point, but no formal education.  Does check his fasting blood glucose every morning.  This morning CBG was 144.  Patient not able to verbalize signs and symptoms of hypo or hyperglycemia.  Per patient he does not know when his blood sugars are high or low.  Patient states he is on a regular diet and just "try to stay away from sugars".  Patient's last A1C is 5.6 on 04/04/17.  Patient can verbalize A1C needs to be between 6-7.  Per patient he does not have any family support.  He states all his closest relatives are in poorer health than him.  His kids are not around.  Patient does state he did follow up with the Sleep Specialist and should receive his CPAP soon.  Patient denies headache currently, but does state primary MD is setting him up to have CT of head completed.  Patient verbalizes he does have some short term memory loss and has been taking Aricept to assist with this.  Patient with history of fall 3 months ago while walking downtown.  Fall resulted in a broken nose.  Patient unsure of how the fall occurred.  Encouraged patient to monitor his balance and utilize cane more frequently.  Patient also states he does not have the energy to exercise as before.  Reports he has spoken with his Primary Care Provider concerning his lack of energy.  Patient has verbally agreed to follow up outreach monthly calls.  Encounter Medications:  Outpatient Encounter Prescriptions as of 05/27/2017  Medication Sig Note  . aspirin EC 81 MG tablet Take 81 mg by mouth daily.   [name] Kitchen ACCU-CHEK AVIVA PLUS test strip    . ACCU-CHEK SOFTCLIX LANCETS lancets    . alfuzosin (UROXATRAL) 10 MG 24 hr tablet Take  10 mg by mouth at bedtime.  03/04/2014: Taking  . amphetamine-dextroamphetamine (ADDERALL XR) 30 MG 24 hr capsule Take 1 capsule (30 mg total) by mouth every morning.   . blood glucose meter kit and supplies Dispense based on patient and insurance preference. Use up to four times daily as directed. (FOR ICD-9 250.00, 250.01).   . Blood Glucose Monitoring Suppl (ACCU-CHEK AVIVA PLUS) w/Device KIT    . donepezil (ARICEPT) 5 MG tablet Take 1 tablet (5 mg total) by mouth at bedtime.   . ferrous sulfate (KP FERROUS SULFATE) 325 (65 FE) MG tablet Take 325 mg by mouth 3 (three) times daily with meals.    [name] Kitchen glipiZIDE (GLUCOTROL) 10 MG tablet TAKE 1 TABLET BY MOUTH  TWICE A DAY BEFORE MEALS   . losartan (COZAAR) 25 MG tablet Take 1 tablet (25 mg total) by mouth daily.   . metFORMIN (GLUCOPHAGE) 1000 MG tablet Take 1 tablet (1,000 mg total) by mouth 2 (two) times daily with a meal.   . metoprolol succinate (TOPROL-XL) 50 MG 24 hr tablet Take 1 tablet (50 mg total) by mouth daily. Take with or immediately following a meal.   . pravastatin (PRAVACHOL) 20 MG tablet Take 1 tablet (20 mg total) by mouth at bedtime.   . sitaGLIPtin (JANUVIA) 100 MG tablet Take 1 tablet (100 mg total) by mouth daily. (Patient not taking: Reported on 05/23/2017) 05/23/2017: Patient reports on  hold per PCP from 05/20/17  . spironolactone (ALDACTONE) 25 MG tablet Take 1 tablet (25 mg total) by mouth 2 (two) times daily.    No facility-administered encounter medications on file as of 05/27/2017.     Functional Status:  In your present state of health, do you have any difficulty performing the following activities: 05/27/2017 04/04/2017  Hearing? 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 Y N  Comment wears glasses at all times -  Difficulty concentrating or making decisions? Y Y  Comment increasing memory loss, sees neurologist and taking medication for memory -  Walking or climbing  stairs? N N  Dressing or bathing? N N  Doing errands, shopping? N N  Preparing Food and eating ? N N  Using the Toilet? N N  In the past six months, have you accidently leaked urine? N N  Do you have problems with loss of bowel control? N N  Managing your Medications? N N  Managing your Finances? N N  Housekeeping or managing your Housekeeping? N N  Some recent data might be hidden    Fall/Depression Screening: Fall Risk  05/27/2017 05/20/2017 04/04/2017  Falls in the past year? Yes Yes Yes  Number falls in past yr: '1 1 1  ' Injury with Fall? Yes Yes Yes  Comment broken nose - -  Risk Factor Category  - - High Fall Risk  Risk for fall due to : History of fall(s) - -  Risk for fall due to: Comment unsure of how he fell, feels his balance is good at this time - -  Follow up Falls evaluation completed;Education provided;Falls prevention discussed - -   PHQ 2/9 Scores 05/27/2017 05/20/2017 05/17/2017 04/04/2017 04/04/2017 02/08/2017 02/08/2017  PHQ - 2 Score 0 0 2 0 0 0 0  PHQ- 9 Score 2 - 6 - - - -   THN CM Care Plan Problem One     Most Recent Value  Care Plan Problem One  knowledge deficiet of self management of diabetes  Role Documenting the Problem One  [facility] [place] for Problem One  Active  THN Long Term Goal   Patient will maintian hemglobin A1C of less than 7 in the next 6 months.  THN Long Term Goal Start Date  05/27/17  Interventions for Problem One Long Term Goal  discussed A1C and the goal set, discussed diabetic diet  THN CM Short Term Goal #1   Patient will be able to verbalize signs and symptoms of hyperglycemia in 30 days.  THN CM Short Term Goal #1 Start Date  05/27/17  Interventions for Short Term Goal #1  RN sending living well with diabetes booklet, emmi educational information, RN will follow with discussion and teachback  THN CM Short Term Goal #2   Patient will be able to verbalize signs and symptoms of hypoglycemia in 30 days  THN CM Short Term Goal #2 Start  Date  05/27/17  Interventions for Short Term Goal #2  RN sending living well with diabetes booklet, emmi educational information, RN will follow with discussion and teachback  THN CM Short Term Goal #3  Patient will not have a fall within the next 30 days.  THN CM Short Term Goal #3 Start Date  05/27/17  Interventions for Short Tern Goal #3  [place] discussed fall precautions and fall preventions.  RN sent EMMI educational material. on falls prevention.  RN  will followup with further discussion and teachback.     Assessment:  Patient's A1C 5.6.   Fasting blood sugar 144 this morning. Patient does not know signs and symptoms of hypo/hyperglycemia. Patient does not know diabetic diet. Patient had recent fall 3 months ago.  Plan:  RN Health Coach discussed fall prevention. RN Health Coach sent EMMI education on Falls Prevention. RN Health Coach discussed hypo and hyperglycemia. RN Health Coach sent living well with Diabetes booklet. RN Health Coach sent EMMI education of hypo and hyperglycemia. RN Health Coach sent Calender book for documentation of blood sugars. RN Health Coach will follow up in the month of November for further discussion and teach back. RN Health Coach sent Barrier Letter to Physician. RN Health Coach sent Assessment to Physician.  [place] [phone] [email or web address] [email or web address]

## 2017-05-29 ENCOUNTER — Other Ambulatory Visit: Payer: Self-pay | Admitting: Physician Assistant

## 2017-05-29 ENCOUNTER — Telehealth: Payer: Self-pay | Admitting: Neurology

## 2017-05-29 ENCOUNTER — Other Ambulatory Visit: Payer: Self-pay | Admitting: Family Medicine

## 2017-05-29 DIAGNOSIS — I10 Essential (primary) hypertension: Secondary | ICD-10-CM

## 2017-05-29 NOTE — Telephone Encounter (Signed)
PA for Tier exception for Adderal XR 30 mg sent to [employer]. This was denied and so appeal has been completed and sent to [facility]

## 2017-05-31 ENCOUNTER — Other Ambulatory Visit: Payer: Self-pay | Admitting: Pharmacist

## 2017-05-31 ENCOUNTER — Other Ambulatory Visit: Payer: Self-pay | Admitting: Nurse Practitioner

## 2017-05-31 DIAGNOSIS — C220 Liver cell carcinoma: Secondary | ICD-10-CM

## 2017-05-31 NOTE — Patient Outreach (Signed)
[facility]) Care Management  05/31/2017  [name] 1946-08-20 [id number]  Successful phone outreach to patient.  HIPAA details verified.    Updated patient per note in chart 05/29/17, appears neurology office completed Tier Exception form for generic Adderall XR but it was denied and prescriber office is appealing.    Patient reports he received letter yesterday in mail from his insurance yesterday.     Discussed with patient limited options to assist with cost of generic Adderall XR as his insurance covers it but at high co-pay.  Patient's PCP as already discontinued sitagliptin secondary to cost, which is a medication patient may have been able to obtain manufacturer assistance with.   At this time, patient denies medication questions.  He was provided with [facility] Pharmacist phone number to call if new needs arise.    Plan:  Will close his pharmacy episode as he denies further pharmacy needs.    Per notes in chart, his prescriber office is working to appeal Tier Exception decision with his insurance.   Karrie Meres, PharmD, [place] [phone]

## 2017-06-03 ENCOUNTER — Ambulatory Visit (HOSPITAL_BASED_OUTPATIENT_CLINIC_OR_DEPARTMENT_OTHER): Payer: Medicare Other | Admitting: Nurse Practitioner

## 2017-06-03 ENCOUNTER — Other Ambulatory Visit (HOSPITAL_BASED_OUTPATIENT_CLINIC_OR_DEPARTMENT_OTHER): Payer: Medicare Other

## 2017-06-03 ENCOUNTER — Telehealth: Payer: Self-pay | Admitting: Nurse Practitioner

## 2017-06-03 ENCOUNTER — Other Ambulatory Visit: Payer: Self-pay | Admitting: Family Medicine

## 2017-06-03 VITALS — BP 134/73 | HR 77 | Temp 98.9°F | Resp 17 | Ht 69.0 in | Wt 210.2 lb

## 2017-06-03 DIAGNOSIS — C220 Liver cell carcinoma: Secondary | ICD-10-CM

## 2017-06-03 DIAGNOSIS — Z8505 Personal history of malignant neoplasm of liver: Secondary | ICD-10-CM

## 2017-06-03 DIAGNOSIS — D649 Anemia, unspecified: Secondary | ICD-10-CM

## 2017-06-03 LAB — CBC WITH DIFFERENTIAL/PLATELET
BASO%: 0.6 % (ref 0.0–2.0)
Basophils Absolute: 0 10*3/uL (ref 0.0–0.1)
EOS%: 0.8 % (ref 0.0–7.0)
Eosinophils Absolute: 0 10*3/uL (ref 0.0–0.5)
HCT: 26 % — ABNORMAL LOW (ref 38.4–49.9)
HGB: 8.4 g/dL — ABNORMAL LOW (ref 13.0–17.1)
LYMPH%: 22.7 % (ref 14.0–49.0)
MCH: 33.4 pg (ref 27.2–33.4)
MCHC: 32.2 g/dL (ref 32.0–36.0)
MCV: 103.7 fL — ABNORMAL HIGH (ref 79.3–98.0)
MONO#: 0.2 10*3/uL (ref 0.1–0.9)
MONO%: 8.8 % (ref 0.0–14.0)
NEUT#: 1.9 10*3/uL (ref 1.5–6.5)
NEUT%: 67.1 % (ref 39.0–75.0)
Platelets: 99 10*3/uL — ABNORMAL LOW (ref 140–400)
RBC: 2.51 10*6/uL — ABNORMAL LOW (ref 4.20–5.82)
RDW: 16.8 % — ABNORMAL HIGH (ref 11.0–14.6)
WBC: 2.8 10*3/uL — ABNORMAL LOW (ref 4.0–10.3)
lymph#: 0.6 10*3/uL — ABNORMAL LOW (ref 0.9–3.3)

## 2017-06-03 NOTE — Telephone Encounter (Signed)
Gave patient avs and calendar with appts per 10/15 los

## 2017-06-03 NOTE — Telephone Encounter (Signed)
I have fax'ed the information that she was needing at this time.

## 2017-06-03 NOTE — Progress Notes (Signed)
  [place] OFFICE PROGRESS NOTE   Diagnosis:  Hepatocellular carcinoma, cirrhosis  INTERVAL HISTORY:   [name] returns for follow-up. He continues oral iron. He denies bleeding. He notes stools are dark related to the iron. No nausea or vomiting. No abdominal pain.  Objective:  Vital signs in last 24 hours:  Blood pressure 134/73, pulse 77, temperature 98.9 F (37.2 C), temperature source Oral, resp. rate 17, height 5\' 9"  (1.753 m), weight 210 lb 3.2 oz (95.3 kg), SpO2 100 %.    HEENT: neck without mass. Resp: lungs clear bilaterally. Cardio: regular rate and rhythm. GI: abdomen soft and nontender. No hepatomegaly. No apparent ascites. Vascular: trace edema at the ankles bilaterally.   Lab Results:  Lab Results  Component Value Date   WBC 2.8 (L) 06/03/2017   HGB 8.4 (L) 06/03/2017   HCT 26.0 (L) 06/03/2017   MCV 103.7 (H) 06/03/2017   PLT 99 (L) 06/03/2017   NEUTROABS 1.9 06/03/2017    Imaging:  No results found.  Medications: I have reviewed the patient's current medications.  Assessment/Plan: 1. Hepatocellular carcinoma, stage I (T1 NX) status post partial left hepatectomy 03/02/2013.  08/31/2013 AFP 2.4.   CT abdomen 08/31/2013 with postoperative changes of partial hepatectomy with small postoperative fluid collection along the resection margin. No definite signs to suggest residual or locally recurrent disease. Several borderline enlarged and minimally enlarged lymph nodes superior to the liver in the juxtapericardiac fat of the lower anterior mediastinum with the largest measuring 11 mm slightly increased compared to the prior study 01/15/2013. Several prominent non-pathologically enlarged upper abdominal ligament lymph nodes similar to the prior study. Small esophageal varices.   CT abdomen 03/03/2012 without evidence of recurrent hepatocellular carcinoma, stable right lung base nodule  CT the abdomen and pelvis 09/06/2014 without  evidence of recurrent hepatocellular carcinoma, slight enlargement of pre-cardiac lymph nodes  CT abdomen/pelvis 09/07/2015 with no findings for recurrent tumor or metastatic disease. Stable small 3 mm right middle lobe pulmonary nodule since 2014. Epicardial lymph node measured 11 mm, previously 13 mm. Stable cirrhotic changes. Fairly significant esophageal varices.  CT 09/03/2016-negative for recurrent hepatocellular carcinoma, changes of cirrhosis with varices 2. Cirrhosis. 3. Hepatitis B core antibody positive. 4. Diabetes. 5. Severe microcytic anemia-likely iron deficiency anemia-improved 6. Colonoscopy 12/27/2016-external and internal hemorrhoids. Diverticulosis in the sigmoid, descending and transverse colon. One medium polyp in the distal descending colon. 3 small polyps in the transverse colon. 2 diminutive polyps in the rectum and the proximal ascending colon. Medium-sized lipoma transverse colon. Small lipoma mid ascending colon. Multiplenonbleeding colonic angiodysplastic lesions. Pathology-tubular adenomas, hyperplastic polyps. 7. Leukopenia/thrombocytopenia-likely secondary to cirrhosis   Disposition: [name] remains in clinical remission from hepatocellular carcinoma. He has progressive anemia on labs today. He appears asymptomatic. He will complete a set of stool cards. He will return for labs and a follow-up visit in one week. He may need an upper endoscopy. We reviewed signs/symptoms suggestive of progressive anemia. He understands to contact the office prior to his next visit should he develop any of these.  Plan reviewed with Dr. Benay Spice.    Ned Card ANP/GNP-BC   06/03/2017  12:11 PM

## 2017-06-03 NOTE — Telephone Encounter (Signed)
[place] with [facility] Appeals is calling regarding a clinical question for the appeal for amphetamine-dextroamphetamine (ADDERALL XR) 30 MG 24 hr capsule. She will also send a fax.

## 2017-06-03 NOTE — Telephone Encounter (Addendum)
Called [name] back, left VM on the ext # that was provided to me. LVM for her to return call.

## 2017-06-05 ENCOUNTER — Ambulatory Visit (HOSPITAL_BASED_OUTPATIENT_CLINIC_OR_DEPARTMENT_OTHER): Payer: Medicare Other

## 2017-06-05 ENCOUNTER — Other Ambulatory Visit: Payer: Self-pay | Admitting: *Deleted

## 2017-06-05 DIAGNOSIS — Z8505 Personal history of malignant neoplasm of liver: Secondary | ICD-10-CM

## 2017-06-05 DIAGNOSIS — C220 Liver cell carcinoma: Secondary | ICD-10-CM

## 2017-06-05 LAB — FECAL OCCULT BLOOD, GUAIAC: Occult Blood: POSITIVE

## 2017-06-06 ENCOUNTER — Telehealth: Payer: Self-pay | Admitting: Emergency Medicine

## 2017-06-06 NOTE — Telephone Encounter (Addendum)
Called Dr.Magods office. Appt made for Wednesday, October 24th @ 4:30pm. This nurse faxed over labs and office notes from last visit with Edward Card, NP. PT verbalized understanding of his appt date and time.   ----- Message from [name], NP sent at 06/06/2017  8:41 AM EDT ----- Please let him know stool tested positive for blood. Please call Dr. Perley Jain office (he is an established patient) and arrange for an appt. for him.

## 2017-06-10 ENCOUNTER — Ambulatory Visit (INDEPENDENT_AMBULATORY_CARE_PROVIDER_SITE_OTHER): Payer: Medicare Other | Admitting: Family Medicine

## 2017-06-10 ENCOUNTER — Other Ambulatory Visit (HOSPITAL_BASED_OUTPATIENT_CLINIC_OR_DEPARTMENT_OTHER): Payer: Medicare Other

## 2017-06-10 ENCOUNTER — Encounter: Payer: Self-pay | Admitting: Family Medicine

## 2017-06-10 ENCOUNTER — Telehealth: Payer: Self-pay | Admitting: Nurse Practitioner

## 2017-06-10 ENCOUNTER — Ambulatory Visit: Payer: Medicare Other | Admitting: Neurology

## 2017-06-10 ENCOUNTER — Ambulatory Visit (HOSPITAL_BASED_OUTPATIENT_CLINIC_OR_DEPARTMENT_OTHER): Payer: Medicare Other | Admitting: Nurse Practitioner

## 2017-06-10 VITALS — BP 142/63 | HR 69 | Temp 97.8°F | Resp 20 | Ht 69.0 in | Wt 212.1 lb

## 2017-06-10 VITALS — BP 126/66 | HR 78 | Temp 98.6°F | Resp 18 | Ht 69.0 in | Wt 212.6 lb

## 2017-06-10 DIAGNOSIS — C220 Liver cell carcinoma: Secondary | ICD-10-CM

## 2017-06-10 DIAGNOSIS — M542 Cervicalgia: Secondary | ICD-10-CM

## 2017-06-10 DIAGNOSIS — Z8505 Personal history of malignant neoplasm of liver: Secondary | ICD-10-CM

## 2017-06-10 DIAGNOSIS — R51 Headache: Secondary | ICD-10-CM

## 2017-06-10 DIAGNOSIS — D500 Iron deficiency anemia secondary to blood loss (chronic): Secondary | ICD-10-CM | POA: Diagnosis not present

## 2017-06-10 LAB — CBC WITH DIFFERENTIAL/PLATELET
BASO%: 0.4 % (ref 0.0–2.0)
Basophils Absolute: 0 10*3/uL (ref 0.0–0.1)
EOS%: 1 % (ref 0.0–7.0)
Eosinophils Absolute: 0 10*3/uL (ref 0.0–0.5)
HCT: 26.5 % — ABNORMAL LOW (ref 38.4–49.9)
HGB: 8.6 g/dL — ABNORMAL LOW (ref 13.0–17.1)
LYMPH%: 22.9 % (ref 14.0–49.0)
MCH: 33.7 pg — ABNORMAL HIGH (ref 27.2–33.4)
MCHC: 32.5 g/dL (ref 32.0–36.0)
MCV: 103.8 fL — ABNORMAL HIGH (ref 79.3–98.0)
MONO#: 0.3 10*3/uL (ref 0.1–0.9)
MONO%: 9.1 % (ref 0.0–14.0)
NEUT#: 2.3 10*3/uL (ref 1.5–6.5)
NEUT%: 66.6 % (ref 39.0–75.0)
Platelets: 104 10*3/uL — ABNORMAL LOW (ref 140–400)
RBC: 2.55 10*6/uL — ABNORMAL LOW (ref 4.20–5.82)
RDW: 16.5 % — ABNORMAL HIGH (ref 11.0–14.6)
WBC: 3.4 10*3/uL — ABNORMAL LOW (ref 4.0–10.3)
lymph#: 0.8 10*3/uL — ABNORMAL LOW (ref 0.9–3.3)

## 2017-06-10 MED ORDER — TIZANIDINE HCL 2 MG PO TABS: 2.0000 mg | ORAL_TABLET | Freq: Three times a day (TID) | ORAL | 0 refills | Status: DC | PRN

## 2017-06-10 NOTE — Patient Instructions (Addendum)
  Based on symptoms today, headache may be related to your neck. You did have some arthritis around previous surgery when CT scan obtained earlier this year.  Can try tizanidine (muscle relaxant) up to 3 times per day, but be careful wit this medicine and risk of falls. Heat or ice to neck and gentle range of motion throughout the day.  If not improving this week - return for xrays. If any worsening of headaches, return here or emergency room to see if repeat cat scan of head needed.    Return to the clinic or go to the nearest emergency room if any of your symptoms worsen or new symptoms occur.    IF you received an x-ray today, you will receive an invoice from [facility]. Please contact [facility] at [phone] with questions or concerns regarding your invoice.   IF you received labwork today, you will receive an invoice from [place] [facility]. Please contact [facility] at [phone] with questions or concerns regarding your invoice.   Our billing staff will not be able to assist you with questions regarding bills from these companies.  You will be contacted with the lab results as soon as they are available. The fastest way to get your results is to activate your My Chart account. Instructions are located on the last page of this paperwork. If you have not heard from [place] regarding the results in 2 weeks, please contact this office.

## 2017-06-10 NOTE — Progress Notes (Signed)
Subjective:    Patient ID: [name], male    DOB: 11/02/1945, 71 y.o.   MRN: [id number]  I,Amoya Bennett,acting as a scribe for Wendie Agreste, MD.,have documented all relevant documentation on the behalf of Edward Munford R, MD,as directed by  Wendie Agreste, MD while in the presence of Rozina Pointer R, MD.   [name] is a 71 y.o. male who presents to Primary Care at [facility] complaining of headaches.  He was seen on 10/1 he was having intermittent left temple headache for 2 weeks, constant for 2-3 days, but better that day than previous. No associated concerning symptoms including no visual symptoms. He is followed by sleep specialist for narcolepsy and OSA. Sed rate was overall reassuring at 27. TSH and B12 were normal. Recommended Tylenol over-the-counter. RTC precautions if not improving.   Today he notes his left sided headaches have not gotten any better. This left sided headache pain is frequent. He denies associated symptoms of nausea or vomiting. He denies change in vision, no new weakness or dysphagia. He has tried to go to bed earlier in the evenings but the headache will keep him from sleeping. This pain is exacerbated from his head rotating bilaterally, With neck movement or laying down. He has tried to tilt his head to the left side as well as use his hand to prop his head up.  This helped dissipate the pain for a few minutes. He is not taking Tylenol for the pain. He tried taking 4 and that dd not work for him. He has not tried an ice pack.   He report he saw Dr. Benay Spice earlier today and mentioned this headache pain. The patient was told to get this headache pain followed up by Dr. Carlota Raspberry.   In the past he notes having a cervical spine surgery many years ago with a previous physician who noted he most likely will not have residual or returning surgery pain. He had CT of cervical spine in 10/2016 After an injury to left side of face without cervical spine  fracture. There was a C5-6 cervical fusion with interbody block. 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     Patient Active Problem List   Diagnosis Date Noted  . Excessive daytime sleepiness 04/09/2017  . Snoring 04/09/2017  . Memory impairment of gradual onset 04/09/2017  . Aortic atherosclerosis ([place]) 11/27/2016  . 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 Altered mental status 03/05/2013  . Respiratory failure, post-operative ([place]) 03/02/2013  . Hepatitis B 02/24/2013  . Cancer, hepatocellular ([place]) 02/10/2013  . Liver tumor-bleeding 01/15/2013  . Epidermal cyst 06/18/2012  . OSA (obstructive sleep apnea) 03/13/2012  . Abnormal leg movement 12/12/2011  . Blood pressure elevated 09/06/2011  . Diabetes mellitus ([place]) 09/06/2011  . 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 BPH (benign prostatic hyperplasia) 09/06/2011  . Diverticula of colon 09/06/2011   Past Medical History:  Diagnosis Date  . Cancer [facility])    hepatocellular cancer   . Diabetes mellitus without complication ([place])   . Hepatitis   . Hyperlipidemia   . Hypertension   . Narcolepsy    per office visit note of 08/2011   . Sleep apnea    Past Surgical History:  Procedure Laterality Date  . LAPAROSCOPY  03/02/2013   Procedure: LAPAROSCOPY DIAGNOSTIC;  Surgeon: [name], MD;  Location: [facility] ORS;  Service: General;;  . LIVER ULTRASOUND  03/02/2013   Procedure: LIVER ULTRASOUND;  Surgeon: Dorris Fetch  Barry Dienes, MD;  Location: [facility] ORS;  Service: General;;  . OPEN PARTIAL HEPATECTOMY   03/02/2013   Procedure: OPEN PARTIAL HEPATECTOMY [83];  Surgeon: [name], MD;  Location: [facility] ORS;  Service: General;;  DX LAPAROSCOPY, INTRAOPERATIVE LIVER ULTRASOUND, OPEN PARTIAL HEPATECTOMY  . pinched nerve in back     Allergies  Allergen Reactions  . Ace Inhibitors Swelling    Angioedema - face.    Prior to Admission medications   Medication Sig Start Date End Date Taking? Authorizing  Provider  ACCU-CHEK AVIVA PLUS test strip USE UP TO 4 TIMES DAILY AS DIRECTED 06/05/17  Yes Wendie Agreste, MD  ACCU-CHEK SOFTCLIX LANCETS lancets  04/26/17  Yes [provider]  alfuzosin (UROXATRAL) 10 MG 24 hr tablet Take 10 mg by mouth at bedtime.  12/19/12  Yes [provider]  amphetamine-dextroamphetamine (ADDERALL XR) 30 MG 24 hr capsule Take 1 capsule (30 mg total) by mouth every morning. 04/09/17  Yes Dohmeier, Asencion Partridge, MD  aspirin EC 81 MG tablet Take 81 mg by mouth daily.   Yes [provider]  blood glucose meter kit and supplies Dispense based on patient and insurance preference. Use up to four times daily as directed. (FOR ICD-9 250.00, 250.01). 04/25/17  Yes Wendie Agreste, MD  Blood Glucose Monitoring Suppl (ACCU-CHEK AVIVA PLUS) w/Device KIT  04/26/17  Yes [provider]  donepezil (ARICEPT) 5 MG tablet Take 1 tablet (5 mg total) by mouth at bedtime. 04/09/17  Yes Dohmeier, Asencion Partridge, MD  ferrous sulfate (KP FERROUS SULFATE) 325 (65 FE) MG tablet Take 325 mg by mouth 3 (three) times daily with meals.    Yes [provider]  glipiZIDE (GLUCOTROL) 10 MG tablet TAKE 1 TABLET BY MOUTH  TWICE A DAY BEFORE MEALS 02/08/17  Yes Wendie Agreste, MD  glipiZIDE (GLUCOTROL) 5 MG tablet TAKE 1 TABLET BY MOUTH  TWICE A DAY BEFORE MEALS 06/03/17  Yes Weber, Sarah L, PA-C  losartan (COZAAR) 25 MG tablet TAKE 1 TABLET BY MOUTH  DAILY 06/03/17  Yes Weber, Damaris Hippo, PA-C  metFORMIN (GLUCOPHAGE) 1000 MG tablet Take 1 tablet (1,000 mg total) by mouth 2 (two) times daily with a meal. 01/05/17  Yes Wendie Agreste, MD  metFORMIN (GLUCOPHAGE) 1000 MG tablet TAKE 1 TABLET BY MOUTH TWO  TIMES DAILY WITH A MEAL 06/03/17  Yes Wendie Agreste, MD  metoprolol succinate (TOPROL-XL) 50 MG 24 hr tablet TAKE 1 TABLET BY MOUTH  DAILY 06/03/17  Yes Weber, Sarah L, PA-C  pravastatin (PRAVACHOL) 20 MG tablet Take 1 tablet (20 mg total) by mouth at bedtime. 01/05/17  Yes Wendie Agreste, MD  pravastatin (PRAVACHOL) 20 MG tablet TAKE 1 TABLET BY MOUTH AT  BEDTIME 06/03/17  Yes Wendie Agreste, MD  sitaGLIPtin (JANUVIA) 100 MG tablet Take 1 tablet (100 mg total) by mouth daily. 01/05/17  Yes Wendie Agreste, MD  spironolactone (ALDACTONE) 25 MG tablet Take 1 tablet (25 mg total) by mouth 2 (two) times daily. 01/05/17  Yes Wendie Agreste, MD  spironolactone (ALDACTONE) 25 MG tablet TAKE 1 TABLET BY MOUTH TWO  TIMES DAILY 06/03/17  Yes Wendie Agreste, MD   Social History   Social History  . Marital status: Single    Spouse name: N/A  . Number of children: 2  . Years of education: 15+   Occupational History  . Not on file.   Social History Main Topics  . Smoking status: Former Smoker  Years: 1.00  . Smokeless tobacco: Never Used  . Alcohol use No  . Drug use: No  . Sexual activity: Yes   Other Topics Concern  . Not on file   Social History Narrative   Patient is single and lives alone- became widower when 1 child was 75 year old, divorced 2nd marriage   Has #2 grown children, not in area or involved in his life   Patient is retired.   Patient has a college education.   Patient is right-handed.   Patient drinks maybe two sodas daily.    Review of Systems  Constitutional: Negative for fatigue and unexpected weight change.  HENT:       Negative for change in vision  Eyes: Negative for visual disturbance.  Respiratory: Negative for cough, chest tightness and shortness of breath.   Cardiovascular: Negative for chest pain, palpitations and leg swelling.  Gastrointestinal: Negative for abdominal pain and blood in stool.  Neurological: Negative for dizziness, light-headedness and headaches.       Denies Dysphasia        Objective:   Physical Exam  Constitutional: He is oriented to person, place, and time. He appears well-developed and well-nourished.  HENT:  Head: Normocephalic and atraumatic.  Eyes: Pupils are equal, round, and reactive  to light. EOM are normal.  Neck: No JVD present. Carotid bruit is not present.  Cardiovascular: Normal rate, regular rhythm and normal heart sounds.   No murmur heard. Pulmonary/Chest: Effort normal and breath sounds normal. He has no rales.  Musculoskeletal: He exhibits tenderness. He exhibits no edema.  Tender along left perispinal muscle, no midline bony tenderness.  Decreased neck extension lacking approximately 45-50 degrees. Flection was intact. About 45 degree of rotation bilaterally. He did have some reproduction of pain with rotation of neck. Slight discomfort with lateral flection of neck. Strength intact and equally in upper extremities.   Neurological: He is alert and oriented to person, place, and time.  Difficulty with left triceps reflex.  2+ right left triceps reflex. Non-focal neuro exam   Skin: Skin is warm and dry.  Skin exam shows no rash   Psychiatric: He has a normal mood and affect.  Vitals reviewed.   Vitals:   06/10/17 1627  BP: 126/66  Pulse: 78  Resp: 18  Temp: 98.6 F (37 C)  TempSrc: Oral  SpO2: 100%  Weight: 212 lb 9.6 oz (96.4 kg)  Height: '5\' 9"'  (1.753 m)      Assessment & Plan:   KYMANI SHIMABUKURO is a 71 y.o. male Neck pain on left side - Plan: tiZANidine (ZANAFLEX) 2 MG tablet Left-sided headache - Plan: tiZANidine (ZANAFLEX) 2 MG tablet  - Persistent left-sided headache that radiates to left neck. Reassuring sedimentation rate prior, nonfocal neuro exam. Ebony Hail additional history that pain worsens with neck movement, and positional changes, suspect some degenerative changes/DDD of neck may be contributing. CT scan earlier this year did show some surrounding degenerative changes from previous surgery.  - Trial of tizanidine low-dose 3 times a day when necessary. Potential side effects discussed including sedation and fall risk  - Tylenol if needed for headaches, gentle range of motion at the cervical spine, heat or ice as needed.  - If  headache is not improving within the next week, consider repeat CT of head or possible evaluation with neuro/headache specialist. RTC sooner if worsening. ER precautions given  Meds ordered this encounter  Medications  . tiZANidine (ZANAFLEX) 2 MG tablet  Sig: Take 1 tablet (2 mg total) by mouth every 8 (eight) hours as needed for muscle spasms.    Dispense:  15 tablet    Refill:  0   Patient Instructions    Based on symptoms today, headache may be related to your neck. You did have some arthritis around previous surgery when CT scan obtained earlier this year.  Can try tizanidine (muscle relaxant) up to 3 times per day, but be careful wit this medicine and risk of falls. Heat or ice to neck and gentle range of motion throughout the day.  If not improving this week - return for xrays. If any worsening of headaches, return here or emergency room to see if repeat cat scan of head needed.    Return to the clinic or go to the nearest emergency room if any of your symptoms worsen or new symptoms occur.    IF you received an x-ray today, you will receive an invoice from [facility]. Please contact [facility] at [phone] with questions or concerns regarding your invoice.   IF you received labwork today, you will receive an invoice from [facility]. Please contact [facility] at [phone] with questions or concerns regarding your invoice.   Our billing staff will not be able to assist you with questions regarding bills from these companies.  You will be contacted with the lab results as soon as they are available. The fastest way to get your results is to activate your My Chart account. Instructions are located on the last page of this paperwork. If you have not heard from [place] regarding the results in 2 weeks, please contact this office.      I personally performed the services described in this documentation, which was scribed in my presence. The recorded information has  been reviewed and considered for accuracy and completeness, addended by me as needed, and agree with information above.  Signed,   [name], MD Primary [facility] at [place].  06/12/17 11:56 AM

## 2017-06-10 NOTE — Progress Notes (Signed)
  [facility] OFFICE PROGRESS NOTE   Diagnosis:  Hepatocellular carcinoma, cirrhosis  INTERVAL HISTORY:   [name] returns as scheduled. He continues oral iron 3 times a day. He notes his stools are dark. No maroon stools. No nausea or vomiting. No abdominal pain. He denies shortness of breath. Main complaint continues to be intermittent headaches. He has tried Tylenol with no improvement. He denies diplopia. This is being evaluated by Dr. [name].  Objective:   Vital signs in last 24 hours:  Blood pressure (!) 142/63, pulse 69, temperature 97.8 F (36.6 C), temperature source Oral, resp. rate 20, height 5\' 9"  (1.753 m), weight 212 lb 1.6 oz (96.2 kg), SpO2 100 %.    HEENT: no thrush or ulcers. PERRLA. Extraocular movements intact. Lymphatics: no palpable cervical or supra-clavicular lymph nodes. Resp: lungs clear bilaterally. Cardio: regular rate and rhythm. GI: abdomen soft and nontender. No hepatomegaly. Vascular: trace bilateral ankle edema.  Lab Results:  Lab Results  Component Value Date   WBC 3.4 (L) 06/10/2017   HGB 8.6 (L) 06/10/2017   HCT 26.5 (L) 06/10/2017   MCV 103.8 (H) 06/10/2017   PLT 104 (L) 06/10/2017   NEUTROABS 2.3 06/10/2017    Imaging:  No results found.  Medications: I have reviewed the patient's current medications.  Assessment/Plan: 1. Hepatocellular carcinoma, stage I (T1 NX) status post partial left hepatectomy 03/02/2013.  08/31/2013 AFP 2.4.   CT abdomen 08/31/2013 with postoperative changes of partial hepatectomy with small postoperative fluid collection along the resection margin. No definite signs to suggest residual or locally recurrent disease. Several borderline enlarged and minimally enlarged lymph nodes superior to the liver in the juxtapericardiac fat of the lower anterior mediastinum with the largest measuring 11 mm slightly increased compared to the prior study 01/15/2013. Several prominent non-pathologically  enlarged upper abdominal ligament lymph nodes similar to the prior study. Small esophageal varices.   CT abdomen 03/03/2012 without evidence of recurrent hepatocellular carcinoma, stable right lung base nodule  CT the abdomen and pelvis 09/06/2014 without evidence of recurrent hepatocellular carcinoma, slight enlargement of pre-cardiac lymph nodes  CT abdomen/pelvis 09/07/2015 with no findings for recurrent tumor or metastatic disease. Stable small 3 mm right middle lobe pulmonary nodule since 2014. Epicardial lymph node measured 11 mm, previously 13 mm. Stable cirrhotic changes. Fairly significant esophageal varices.  CT 09/03/2016-negative for recurrent hepatocellular carcinoma, changes of cirrhosis with varices 2. Cirrhosis. 3. Hepatitis B core antibody positive. 4. Diabetes. 5. Severe microcytic anemia-likely iron deficiency anemia-progressive 06/03/2017. Stool Hemoccult positive 3 06/05/2017. Hemoglobin stable 06/10/2017. 6. Colonoscopy 12/27/2016-external and internal hemorrhoids. Diverticulosis in the sigmoid, descending and transverse colon. One medium polyp in the distal descending colon. 3 small polyps in the transverse colon. 2 diminutive polyps in the rectum and the proximal ascending colon. Medium-sized lipoma transverse colon. Small lipoma mid ascending colon. Multiplenonbleeding colonic angiodysplastic lesions. Pathology-tubular adenomas, hyperplastic polyps. 7. Leukopenia/thrombocytopenia-likely secondary to cirrhosis    Disposition:Mr. [place] appears unchanged. The hemoglobin is stable. Stool returned positive for occult blood 3 on 06/05/2017. He has an appointment with Dr. Watt Climes 06/12/2017. He will continue oral iron as he is currently taking. He will return for a follow-up CBC in 2 weeks.   He will follow-up with Dr. [name] regarding persistent headaches.  Next scheduled office visit 07/29/2017. We will adjust accordingly as needed.  Plan reviewed with Dr.  Benay Spice.  Ned Card ANP/GNP-BC   06/10/2017  9:20 AM

## 2017-06-10 NOTE — Telephone Encounter (Signed)
Gave avs and calendar for November  °

## 2017-06-11 DIAGNOSIS — G4733 Obstructive sleep apnea (adult) (pediatric): Secondary | ICD-10-CM | POA: Diagnosis not present

## 2017-06-11 NOTE — Telephone Encounter (Signed)
PA approved for Amphetamine/Dextroamphetamine was approved for the tier 3 copayment.  Approved from May 28 2017- Dec 31,2019.  [id number]

## 2017-06-12 DIAGNOSIS — D500 Iron deficiency anemia secondary to blood loss (chronic): Secondary | ICD-10-CM | POA: Diagnosis not present

## 2017-06-13 ENCOUNTER — Other Ambulatory Visit: Payer: Self-pay | Admitting: Neurology

## 2017-06-20 ENCOUNTER — Ambulatory Visit: Payer: Medicare Other | Admitting: Family Medicine

## 2017-06-24 ENCOUNTER — Telehealth: Payer: Self-pay | Admitting: Emergency Medicine

## 2017-06-24 ENCOUNTER — Other Ambulatory Visit (HOSPITAL_BASED_OUTPATIENT_CLINIC_OR_DEPARTMENT_OTHER): Payer: Medicare Other

## 2017-06-24 DIAGNOSIS — C220 Liver cell carcinoma: Secondary | ICD-10-CM

## 2017-06-24 DIAGNOSIS — Z8505 Personal history of malignant neoplasm of liver: Secondary | ICD-10-CM | POA: Diagnosis not present

## 2017-06-24 LAB — CBC WITH DIFFERENTIAL/PLATELET
BASO%: 0.9 % (ref 0.0–2.0)
Basophils Absolute: 0 10*3/uL (ref 0.0–0.1)
EOS%: 1.7 % (ref 0.0–7.0)
Eosinophils Absolute: 0 10*3/uL (ref 0.0–0.5)
HCT: 26.8 % — ABNORMAL LOW (ref 38.4–49.9)
HGB: 8.7 g/dL — ABNORMAL LOW (ref 13.0–17.1)
LYMPH%: 24.7 % (ref 14.0–49.0)
MCH: 32.2 pg (ref 27.2–33.4)
MCHC: 32.6 g/dL (ref 32.0–36.0)
MCV: 98.8 fL — ABNORMAL HIGH (ref 79.3–98.0)
MONO#: 0.2 10*3/uL (ref 0.1–0.9)
MONO%: 9.4 % (ref 0.0–14.0)
NEUT#: 1.6 10*3/uL (ref 1.5–6.5)
NEUT%: 63.3 % (ref 39.0–75.0)
Platelets: 113 10*3/uL — ABNORMAL LOW (ref 140–400)
RBC: 2.72 10*6/uL — ABNORMAL LOW (ref 4.20–5.82)
RDW: 14.6 % (ref 11.0–14.6)
WBC: 2.5 10*3/uL — ABNORMAL LOW (ref 4.0–10.3)
lymph#: 0.6 10*3/uL — ABNORMAL LOW (ref 0.9–3.3)

## 2017-06-24 NOTE — Telephone Encounter (Signed)
Spoke with pt in lobby regarding HGB. Ned Card, NP wants patient to continue with Iron after pts endoscopy on 11/8 and discuss blood counts with GI doc. Return for labs in 4 weeks. Pt verbalized understanding of this.

## 2017-06-25 DIAGNOSIS — H40013 Open angle with borderline findings, low risk, bilateral: Secondary | ICD-10-CM | POA: Diagnosis not present

## 2017-06-27 ENCOUNTER — Other Ambulatory Visit: Payer: Self-pay | Admitting: Family Medicine

## 2017-06-27 ENCOUNTER — Other Ambulatory Visit: Payer: Self-pay | Admitting: *Deleted

## 2017-06-27 DIAGNOSIS — R519 Headache, unspecified: Secondary | ICD-10-CM

## 2017-06-27 DIAGNOSIS — M542 Cervicalgia: Secondary | ICD-10-CM

## 2017-06-27 DIAGNOSIS — R51 Headache: Secondary | ICD-10-CM

## 2017-06-27 DIAGNOSIS — D500 Iron deficiency anemia secondary to blood loss (chronic): Secondary | ICD-10-CM | POA: Diagnosis not present

## 2017-06-27 NOTE — Telephone Encounter (Signed)
Zanaflex refill

## 2017-06-27 NOTE — Patient Outreach (Signed)
[facility]) Care Management  06/27/2017  [name] 1946/06/20 [id number]  [place] Monthly Outreach  Referral Date: Referral Source: Reason for Referral: Insurance:   Outreach Attempt: Outreach attempt #1 to patient for monthly outreach. No answer. Unable to leave message due to voice mail not set up.  Plan: RN Health Coach will make another attempt for monthly outreach in the month of November.   [place] [phone] [email or web address]

## 2017-07-01 ENCOUNTER — Telehealth: Payer: Self-pay | Admitting: Oncology

## 2017-07-01 NOTE — Telephone Encounter (Signed)
Faxed office note to ciox risk adjustment [phone]

## 2017-07-12 ENCOUNTER — Encounter: Payer: Self-pay | Admitting: *Deleted

## 2017-07-12 ENCOUNTER — Other Ambulatory Visit: Payer: Self-pay | Admitting: *Deleted

## 2017-07-12 DIAGNOSIS — G4733 Obstructive sleep apnea (adult) (pediatric): Secondary | ICD-10-CM | POA: Diagnosis not present

## 2017-07-12 NOTE — Patient Outreach (Signed)
[facility]) Care Management  07/12/2017  [name] 14-May-1946 [id number]  RN Health Coach Monthly Outreach  Referral Date: 05/23/2017 Referral Source:  Primary MD Reason for Referral:  Medication Assistance Insurance:  [employer]   Outreach Attempt: Successful outreach to patient.  HIPAA verified.  Patient stating he is doing fine.  Monitors his blood sugars daily and records reading to take to follow up appointments.  Patient states he has not taking his blood sugar yet this morning and currently does not have access to his daily readings.  Per his memory he thinks his fasting CBGs have ranged less than 150.  Patient does report having Outpatient procedures with the gastroenterologist to verify if he is having a GI bleed.  Stating he has had a colonoscopy that patient reports was fine and he has had a capsule endoscopy that he is awaiting results.  Patient states he has not received Welcome Kit or any other educational material mailed out.  RN Health Coach will resend material.   Appointments: Patient states he saw Dr. Carlota Raspberry on 06/10/2017.  Is not at his appointment calendar at the moment but thinks his next appointment is in December 2018.  Plan: RN Health Coach discussed fall prevention. RN Health Coach will resend EMMI education on Falls Prevention. RN Health Coach discussed hypo and hyperglycemia. RN Health Coach will resend Living well with Diabetes booklet. RN Health Coach will resend EMMI education of hypo and hyperglycemia. RN Health Coach will resend Calender book for documentation of blood sugars. RN Health Coach will follow up in the month of December for further discussion and teach back.  [email or web address] [phone] [name]

## 2017-07-29 ENCOUNTER — Other Ambulatory Visit: Payer: Medicare Other

## 2017-07-29 ENCOUNTER — Ambulatory Visit: Payer: Medicare Other | Admitting: Nurse Practitioner

## 2017-07-31 ENCOUNTER — Other Ambulatory Visit: Payer: Self-pay | Admitting: Family Medicine

## 2017-08-05 ENCOUNTER — Other Ambulatory Visit: Payer: Self-pay

## 2017-08-05 ENCOUNTER — Ambulatory Visit (INDEPENDENT_AMBULATORY_CARE_PROVIDER_SITE_OTHER): Payer: Medicare Other | Admitting: Family Medicine

## 2017-08-05 ENCOUNTER — Encounter: Payer: Self-pay | Admitting: Family Medicine

## 2017-08-05 VITALS — BP 118/60 | HR 70 | Temp 98.6°F | Resp 18 | Ht 69.0 in | Wt 209.6 lb

## 2017-08-05 DIAGNOSIS — I10 Essential (primary) hypertension: Secondary | ICD-10-CM

## 2017-08-05 DIAGNOSIS — E119 Type 2 diabetes mellitus without complications: Secondary | ICD-10-CM

## 2017-08-05 DIAGNOSIS — E785 Hyperlipidemia, unspecified: Secondary | ICD-10-CM

## 2017-08-05 DIAGNOSIS — M542 Cervicalgia: Secondary | ICD-10-CM

## 2017-08-05 MED ORDER — ACCU-CHEK SOFTCLIX LANCETS MISC: 1 refills | Status: DC

## 2017-08-05 MED ORDER — ALFUZOSIN HCL ER 10 MG PO TB24: 10.0000 mg | ORAL_TABLET | Freq: Every day | ORAL | 0 refills | Status: DC

## 2017-08-05 MED ORDER — METFORMIN HCL 1000 MG PO TABS: 1000.0000 mg | ORAL_TABLET | Freq: Two times a day (BID) | ORAL | 1 refills | Status: DC

## 2017-08-05 MED ORDER — GLUCOSE BLOOD VI STRP: 1.0000 | ORAL_STRIP | 1 refills | Status: DC | PRN

## 2017-08-05 MED ORDER — PRAVASTATIN SODIUM 20 MG PO TABS: 20.0000 mg | ORAL_TABLET | Freq: Every day | ORAL | 1 refills | Status: DC

## 2017-08-05 MED ORDER — METOPROLOL SUCCINATE ER 50 MG PO TB24: 50.0000 mg | ORAL_TABLET | Freq: Every day | ORAL | 1 refills | Status: DC

## 2017-08-05 MED ORDER — SPIRONOLACTONE 25 MG PO TABS: 25.0000 mg | ORAL_TABLET | Freq: Two times a day (BID) | ORAL | 1 refills | Status: DC

## 2017-08-05 MED ORDER — LOSARTAN POTASSIUM 25 MG PO TABS: 25.0000 mg | ORAL_TABLET | Freq: Every day | ORAL | 1 refills | Status: DC

## 2017-08-05 NOTE — Progress Notes (Signed)
Subjective:  By signing my name below, I, [name], attest that this documentation has been prepared under the direction and in the presence of Wendie Agreste, MD Electronically Signed: Ladene Artist, ED Scribe 08/05/2017 at 5:28 PM.   Patient ID: [name], male    DOB: Jan 10, 1946, 71 y.o.   MRN: [id number]  Chief Complaint  Patient presents with  . Neck Pain    follow up  . Medication Refill    aldactone, pravastatin,losartan, metoprolol, donepezil, test strips     HPI [name] is a 71 y.o. male who presents to Primary Care at [facility] for multiple concerns.   Neck Pain Seen 10/22. Suspected DDD. Recommended trial of tizanidine, Tylenol if needed and RTC precautions vs neuro eval. Pt states that HA/neck pain has improved with tizanidine. Reports that he may only have pain once in a 7-9 day span.  Hyperlipidemia Lab Results  Component Value Date   CHOL 152 01/05/2017   HDL 54 01/05/2017   LDLCALC 79 01/05/2017   TRIG 93 01/05/2017   CHOLHDL 2.8 01/05/2017   Lab Results  Component Value Date   ALT 36 01/05/2017   AST 50 (H) 01/05/2017   ALKPHOS 96 01/05/2017   BILITOT 0.9 01/05/2017  Takes Pravastatin 20 mg qd. Pt is not fasting at this visit; ate lunch PTA.  HTN Takes Metoprolol, Losartan, aldactone. Denies cp, sob, light-headedness, dizziness. Lab Results  Component Value Date   CREATININE 1.01 01/05/2017   DM Lab Results  Component Value Date   HGBA1C 5.6 04/04/2017  A1C had decreased from 8.9 a few months prior. Planned for rechecked today. Continued on glipizide 5 mg bid, metformin 1000 mg bid. Initially prescribed Januvia but decided against that with lower A1C. Telephone note from pt outreach reviewed 11/23, reportedly CBGs less than 150.   Pt had been increased to glipizide 10 mg but reports that he has been taking 5 mg bid for the past few months.   Aricept Refill Followed by Dr. Brett Fairy. Treated for narcolepsy. Started on  Aricept by Dr. Brett Fairy or short term memory loss at 8/21 visit. Planned for 3 month f/u. Doesn't appear he has been seen since that time. F/u appointment on 12/26.  BPH Takes Alfuzosin 10 mg qhs.   Patient Active Problem List   Diagnosis Date Noted  . Excessive daytime sleepiness 04/09/2017  . Snoring 04/09/2017  . Memory impairment of gradual onset 04/09/2017  . Aortic atherosclerosis ([place]) 11/27/2016  . 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 Altered mental status 03/05/2013  . Respiratory failure, post-operative ([place]) 03/02/2013  . Hepatitis B 02/24/2013  . Cancer, hepatocellular ([place]) 02/10/2013  . Liver tumor-bleeding 01/15/2013  . Epidermal cyst 06/18/2012  . OSA (obstructive sleep apnea) 03/13/2012  . Abnormal leg movement 12/12/2011  . Blood pressure elevated 09/06/2011  . Diabetes mellitus ([place]) 09/06/2011  . 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 BPH (benign prostatic hyperplasia) 09/06/2011  . Diverticula of colon 09/06/2011   Past Medical History:  Diagnosis Date  . Cancer [facility])    hepatocellular cancer   . Diabetes mellitus without complication ([place])   . Hepatitis   . Hyperlipidemia   . Hypertension   . Narcolepsy    per office visit note of 08/2011   . Sleep apnea    Past Surgical History:  Procedure Laterality Date  . LAPAROSCOPY  03/02/2013   Procedure: LAPAROSCOPY DIAGNOSTIC;  Surgeon: [name], MD;  Location: [facility] ORS;  Service: General;;  .  LIVER ULTRASOUND  03/02/2013   Procedure: LIVER ULTRASOUND;  Surgeon: [name], MD;  Location: [facility]S;  Service: General;;  . OPEN PARTIAL HEPATECTOMY   03/02/2013   Procedure: OPEN PARTIAL HEPATECTOMY [83];  Surgeon: [name], MD;  Location: [facility] ORS;  Service: General;;  DX LAPAROSCOPY, INTRAOPERATIVE LIVER ULTRASOUND, OPEN PARTIAL HEPATECTOMY  . pinched nerve in back     Allergies  Allergen Reactions  . Ace Inhibitors Swelling    Angioedema - face.    Prior to Admission medications     Medication Sig Start Date End Date Taking? Authorizing Provider  ACCU-CHEK AVIVA PLUS test strip USE UP TO 4 TIMES DAILY AS DIRECTED 06/05/17   Wendie Agreste, MD  ACCU-CHEK SOFTCLIX LANCETS lancets  04/26/17   [provider]  alfuzosin (UROXATRAL) 10 MG 24 hr tablet Take 10 mg by mouth at bedtime.  12/19/12   [provider]  amphetamine-dextroamphetamine (ADDERALL XR) 30 MG 24 hr capsule Take 1 capsule (30 mg total) by mouth every morning. 04/09/17   Dohmeier, Asencion Partridge, MD  aspirin EC 81 MG tablet Take 81 mg by mouth daily.    [provider]  blood glucose meter kit and supplies Dispense based on patient and insurance preference. Use up to four times daily as directed. (FOR ICD-9 250.00, 250.01). 04/25/17   Wendie Agreste, MD  Blood Glucose Monitoring Suppl (ACCU-CHEK AVIVA PLUS) w/Device KIT  04/26/17   [provider]  donepezil (ARICEPT) 5 MG tablet Take 1 tablet (5 mg total) by mouth at bedtime. 04/09/17   Dohmeier, Asencion Partridge, MD  donepezil (ARICEPT) 5 MG tablet TAKE 1 TABLET BY MOUTH AT  BEDTIME 06/13/17   Dohmeier, Asencion Partridge, MD  ferrous sulfate (KP FERROUS SULFATE) 325 (65 FE) MG tablet Take 325 mg by mouth 3 (three) times daily with meals.     [provider]  glipiZIDE (GLUCOTROL) 10 MG tablet TAKE 1 TABLET BY MOUTH  TWICE A DAY BEFORE MEALS 02/08/17   Wendie Agreste, MD  glipiZIDE (GLUCOTROL) 5 MG tablet TAKE 1 TABLET BY MOUTH  TWICE A DAY BEFORE MEALS 06/03/17   Weber, Damaris Hippo, PA-C  losartan (COZAAR) 25 MG tablet TAKE 1 TABLET BY MOUTH  DAILY 06/03/17   Gale Journey, Damaris Hippo, PA-C  metFORMIN (GLUCOPHAGE) 1000 MG tablet Take 1 tablet (1,000 mg total) by mouth 2 (two) times daily with a meal. 01/05/17   Wendie Agreste, MD  metFORMIN (GLUCOPHAGE) 1000 MG tablet TAKE 1 TABLET BY MOUTH TWO  TIMES DAILY WITH A MEAL 06/03/17   Wendie Agreste, MD  metoprolol succinate (TOPROL-XL) 50 MG 24 hr tablet TAKE 1 TABLET BY MOUTH  DAILY 06/03/17   Gale Journey, Damaris Hippo,  PA-C  pravastatin (PRAVACHOL) 20 MG tablet Take 1 tablet (20 mg total) by mouth at bedtime. 01/05/17   Wendie Agreste, MD  pravastatin (PRAVACHOL) 20 MG tablet TAKE 1 TABLET BY MOUTH AT  BEDTIME 06/03/17   Wendie Agreste, MD  sitaGLIPtin (JANUVIA) 100 MG tablet Take 1 tablet (100 mg total) by mouth daily. 01/05/17   Wendie Agreste, MD  spironolactone (ALDACTONE) 25 MG tablet Take 1 tablet (25 mg total) by mouth 2 (two) times daily. 01/05/17   Wendie Agreste, MD  spironolactone (ALDACTONE) 25 MG tablet TAKE 1 TABLET BY MOUTH TWO  TIMES DAILY 06/03/17   Wendie Agreste, MD  tiZANidine (ZANAFLEX) 2 MG tablet Take 1 tablet (2 mg total) by mouth every 8 (eight) hours as needed for  muscle spasms. 06/10/17   Wendie Agreste, MD   Social History   Socioeconomic History  . Marital status: Single    Spouse name: Not on file  . Number of children: 2  . Years of education: 15+  . Highest education level: Not on file  Social Needs  . Financial resource strain: Not on file  . Food insecurity - worry: Not on file  . Food insecurity - inability: Not on file  . Transportation needs - medical: Not on file  . Transportation needs - non-medical: Not on file  Occupational History  . Not on file  Tobacco Use  . Smoking status: Former Smoker    Years: 1.00  . Smokeless tobacco: Never Used  Substance and Sexual Activity  . Alcohol use: No  . Drug use: No  . Sexual activity: Yes  Other Topics Concern  . Not on file  Social History Narrative   Patient is single and lives alone- became widower when 1 child was 43 year old, divorced 2nd marriage   Has #2 grown children, not in area or involved in his life   Patient is retired.   Patient has a college education.   Patient is right-handed.   Patient drinks maybe two sodas daily.   Review of Systems  Constitutional: Negative for fatigue and unexpected weight change.  Eyes: Negative for visual disturbance.  Respiratory: Negative for cough,  chest tightness and shortness of breath.   Cardiovascular: Negative for chest pain, palpitations and leg swelling.  Gastrointestinal: Negative for abdominal pain and blood in stool.  Musculoskeletal: Positive for neck pain.  Neurological: Positive for headaches. Negative for dizziness and light-headedness.      Objective:   Physical Exam  Constitutional: He is oriented to person, place, and time. He appears well-developed and well-nourished.  HENT:  Head: Normocephalic and atraumatic.  Eyes: EOM are normal. Pupils are equal, round, and reactive to light.  Neck: No JVD present. Carotid bruit is not present.  He points to location of pain in L paraspinal muscles but nontender at present. No midline bony tenderness.  Cardiovascular: Normal rate, regular rhythm and normal heart sounds.  No murmur heard. Pulmonary/Chest: Effort normal and breath sounds normal. He has no rales.  Musculoskeletal: He exhibits no edema.  Neurological: He is alert and oriented to person, place, and time.  Skin: Skin is warm and dry.  Psychiatric: He has a normal mood and affect.  Vitals reviewed.  Vitals:   08/05/17 1644  BP: 118/60  Pulse: 70  Resp: 18  Temp: 98.6 F (37 C)  TempSrc: Oral  SpO2: 99%  Weight: 209 lb 9.6 oz (95.1 kg)  Height: 5' 9" (1.753 m)      Assessment & Plan:   [name] is a 71 y.o. male Neck pain on left side  - improved, with only intermittent symptoms, improved with tizanidine. Okay to continue for episodic use with potential side effects discussed. Can also discuss at next sleep specialist/neurology appointment.  Essential hypertension - Plan: spironolactone (ALDACTONE) 25 MG tablet, metoprolol succinate (TOPROL-XL) 50 MG 24 hr tablet, losartan (COZAAR) 25 MG tablet, Basic metabolic panel  -Overall stable, meds are tolerated, continue same doses of medications.  -Refilled alfuzosin temporarily until he can meet with his urologist.  Hyperlipidemia, unspecified  hyperlipidemia type - Plan: pravastatin (PRAVACHOL) 20 MG tablet  -Tolerating pravastatin, continue same dose. Plan for fasting labs next visit.  Type 2 diabetes mellitus without complication, without long-term current use of  insulin ([place]) - Plan: Hemoglobin A1c, metFORMIN (GLUCOPHAGE) 1000 MG tablet, glucose blood (ACCU-CHEK AVIVA PLUS) test strip, ACCU-CHEK SOFTCLIX LANCETS lancets  -There is some questionable dosing of glipizide. has been taking 5 mg twice a day. We'll continue same dose for now, metformin same dose, and check A1c. Previously had appeared to be doing well.  Meds ordered this encounter  Medications  . alfuzosin (UROXATRAL) 10 MG 24 hr tablet    Sig: Take 1 tablet (10 mg total) by mouth at bedtime.    Dispense:  30 tablet    Refill:  0  . spironolactone (ALDACTONE) 25 MG tablet    Sig: Take 1 tablet (25 mg total) by mouth 2 (two) times daily.    Dispense:  180 tablet    Refill:  1  . pravastatin (PRAVACHOL) 20 MG tablet    Sig: Take 1 tablet (20 mg total) by mouth at bedtime.    Dispense:  90 tablet    Refill:  1  . metoprolol succinate (TOPROL-XL) 50 MG 24 hr tablet    Sig: Take 1 tablet (50 mg total) by mouth daily. Take with or immediately following a meal.    Dispense:  90 tablet    Refill:  1  . metFORMIN (GLUCOPHAGE) 1000 MG tablet    Sig: Take 1 tablet (1,000 mg total) by mouth 2 (two) times daily with a meal.    Dispense:  180 tablet    Refill:  1  . losartan (COZAAR) 25 MG tablet    Sig: Take 1 tablet (25 mg total) by mouth daily.    Dispense:  90 tablet    Refill:  1  . glucose blood (ACCU-CHEK AVIVA PLUS) test strip    Sig: 1 each by Other route as needed for other. Use as instructed - once per day testing for now.    Dispense:  100 each    Refill:  1    Please consider 90 day supplies to promote better adherence  . ACCU-CHEK SOFTCLIX LANCETS lancets    Sig: Once per day testing.    Dispense:  100 each    Refill:  1   Patient Instructions   I  will check your diabetes test again today.  Continue glipizide 22m twice per day and metformin 10050mtwice per day for now. Ok to check blood sugar once per day (either fasting, or 2 hours after meals).   I refilled alfuzosin for now, but follow up with urologist for further refills.   We will check cholesterol next visit. Make sure are fasting for 8 hours at that visit (morning appointment works best).   I refilled tizanidine if needed for spasms in neck. Discuss that at follow up with your sleep specialist/neurologist at follow up.   Return to the clinic or go to the nearest emergency room if any of your symptoms worsen or new symptoms occur.  Recheck in 3 months. Let me know if there are questions in the meantime.     IF you received an x-ray today, you will receive an invoice [facility]. Please contact [facility] at [phone] questions or concerns regarding your invoice.   IF you received labwork today, you will receive an invoice from [facility] [facility] at [phone] questions or concerns regarding your invoice.   Our billing staff will not be able to assist you with questions regarding bills from these companies.  You will be contacted with the lab results as soon as  they are available. The fastest way to get your results is to activate your My Chart account. Instructions are located on the last page of this paperwork. If you have not heard from [place] regarding the results in 2 weeks, please contact this office.      I personally performed the services described in this documentation, which was scribed in my presence. The recorded information has been reviewed and considered for accuracy and completeness, addended by me as needed, and agree with information above.  Signed,   [name], MD Primary Care at [facility].  08/07/17 10:03 PM

## 2017-08-05 NOTE — Patient Instructions (Addendum)
I will check your diabetes test again today.  Continue glipizide 5mg  twice per day and metformin 1000mg  twice per day for now. Ok to check blood sugar once per day (either fasting, or 2 hours after meals).   I refilled alfuzosin for now, but follow up with urologist for further refills.   We will check cholesterol next visit. Make sure are fasting for 8 hours at that visit (morning appointment works best).   I refilled tizanidine if needed for spasms in neck. Discuss that at follow up with your sleep specialist/neurologist at follow up.   Return to the clinic or go to the nearest emergency room if any of your symptoms worsen or new symptoms occur.  Recheck in 3 months. Let me know if there are questions in the meantime.     IF you received an x-ray today, you will receive an invoice from [facility]. Please contact [facility] at [phone] with questions or concerns regarding your invoice.   IF you received labwork today, you will receive an invoice from [place]. Please contact [facility] at [phone] with questions or concerns regarding your invoice.   Our billing staff will not be able to assist you with questions regarding bills from these companies.  You will be contacted with the lab results as soon as they are available. The fastest way to get your results is to activate your My Chart account. Instructions are located on the last page of this paperwork. If you have not heard from [place] regarding the results in 2 weeks, please contact this office.

## 2017-08-06 LAB — BASIC METABOLIC PANEL
BUN/Creatinine Ratio: 9 — ABNORMAL LOW (ref 10–24)
BUN: 8 mg/dL (ref 8–27)
CO2: 23 mmol/L (ref 20–29)
Calcium: 9.5 mg/dL (ref 8.6–10.2)
Chloride: 108 mmol/L — ABNORMAL HIGH (ref 96–106)
Creatinine, Ser: 0.93 mg/dL (ref 0.76–1.27)
GFR calc Af Amer: 94 mL/min/{1.73_m2} (ref >59)
GFR calc non Af Amer: 82 mL/min/{1.73_m2} (ref >59)
Glucose: 163 mg/dL — ABNORMAL HIGH (ref 65–99)
Potassium: 4.2 mmol/L (ref 3.5–5.2)
Sodium: 144 mmol/L (ref 134–144)

## 2017-08-06 LAB — HEMOGLOBIN A1C
Est. average glucose Bld gHb Est-mCnc: 108 mg/dL
Hgb A1c MFr Bld: 5.4 % (ref 4.8–5.6)

## 2017-08-07 ENCOUNTER — Encounter: Payer: Self-pay | Admitting: Family Medicine

## 2017-08-09 ENCOUNTER — Other Ambulatory Visit: Payer: Self-pay | Admitting: Gastroenterology

## 2017-08-09 ENCOUNTER — Other Ambulatory Visit: Payer: Self-pay | Admitting: *Deleted

## 2017-08-09 ENCOUNTER — Encounter: Payer: Self-pay | Admitting: *Deleted

## 2017-08-09 NOTE — Patient Outreach (Signed)
[facility]) Care Management  08/09/2017  [name] 10/15/1945 [id number]  [place] Monthly Outreach  Referral Date:05/23/2017 Referral Source:Primary MD Reason for Referral:Medication Assistance Insurance:[employer]  Outreach Attempt:  Successful telephone outreach to patient for monthly follow up.  HIPAA verified.  Patient stating he has his good days and bad days of not feeling well.  He cannot verbalize how he feels on his bad days other than "I just don't feel well".  Patient reports he continues to monitor his blood glucose daily, but he is currently out of test strips for the last week and is waiting on the prescription to come from his mail order pharmacy.  Does report his fasting blood sugars has ranged 90-150's over the last month.  He is unsure of his current A1C, but knows it was just checked on Monday at his medical appointment.  Current A1C 5.4 on 08/05/2017.  Patient has been wearing a CPAP at night for evaluation of narcolepsy.  States he is not sure what he needs to be looking for as far as if the machine is making him better.  Does verbalize that he falls asleep less during the daytime since he has been wearing the CPAP at night.  Encouraged patient to make notes and write down questions to take to the physician at his next appointment.  Appointments:  Patient seen his primary care provider, Dr. Carlota Raspberry on Monday, 08/05/2017.  His next appointment will be in March of 2019.  He has scheduled neurology appointment on 08/14/2017.  Plan:  RN Health Coach will make next monthly telephone outreach in the month of January.   [place] [phone] [email or web address]

## 2017-08-09 NOTE — Patient Outreach (Signed)
[facility]) Care Management  08/09/2017  ALAKAI MACBRIDE [birth year] [id number]  RN Health Coach Monthly Outreach  Referral Date: 05/23/2017 Referral Source:  Primary MD Reason for Referral:  Medication Assistance Insurance:  [employer]  Outreach Attempt:  Outreach attempt #1 to patient for monthly follow up. No answer. RN Health Coach unable to leave voice message due to mailbox not being set up.  As [place] was documenting, received call back from patient, stating he was on another line with another health care provider and states he will give this [place] a call back.  Plan:  RN Health Coach will make another outreach attempt in the month of December if no return call back from patient.  [place] Coach [phone] [email or web address]

## 2017-08-11 DIAGNOSIS — G4733 Obstructive sleep apnea (adult) (pediatric): Secondary | ICD-10-CM | POA: Diagnosis not present

## 2017-08-14 ENCOUNTER — Ambulatory Visit: Payer: Medicare Other | Admitting: Neurology

## 2017-08-14 ENCOUNTER — Encounter: Payer: Self-pay | Admitting: Neurology

## 2017-08-14 VITALS — BP 123/70 | HR 72 | Ht 69.0 in | Wt 210.0 lb

## 2017-08-14 DIAGNOSIS — C220 Liver cell carcinoma: Secondary | ICD-10-CM | POA: Diagnosis not present

## 2017-08-14 DIAGNOSIS — G47411 Narcolepsy with cataplexy: Secondary | ICD-10-CM

## 2017-08-14 DIAGNOSIS — G47429 Narcolepsy in conditions classified elsewhere without cataplexy: Secondary | ICD-10-CM

## 2017-08-14 DIAGNOSIS — G4733 Obstructive sleep apnea (adult) (pediatric): Secondary | ICD-10-CM | POA: Diagnosis not present

## 2017-08-14 DIAGNOSIS — Z9114 Patient's other noncompliance with medication regimen: Secondary | ICD-10-CM

## 2017-08-14 MED ORDER — AMPHETAMINE-DEXTROAMPHET ER 30 MG PO CP24: 30.0000 mg | ORAL_CAPSULE | Freq: Every morning | ORAL | 0 refills | Status: DC

## 2017-08-14 MED ORDER — DONEPEZIL HCL 10 MG PO TABS: 10.0000 mg | ORAL_TABLET | Freq: Every day | ORAL | 5 refills | Status: DC

## 2017-08-14 NOTE — Progress Notes (Signed)
SLEEP MEDICINE CLINIC   Provider:  Larey Seat, M D  Primary Care Physician:  Wendie Agreste, MD   Referring Provider: Wendie Agreste, MD  Chief Complaint  Patient presents with  . Follow-up    pt alone, rm 11. pt states he had some problems but they were able to help with getting that fixed.    PATIENT: NESTER BACHUS DOB: October 04, 1945  REASON FOR VISIT: follow up- narcolepsy, obstructive sleep apnea HISTORY FROM: patient.  Interval history from 14 August 2017.  Mr. Milich who has been a longtime established narcoleptic patient with excessive daytime sleepiness also tested again positive for the presence of obstructive sleep apnea.  A baseline polysomnography was performed on 15 April 2017 after he endorsed the Epworth sleepiness score at 17 out of 24 possible points.  His AHI was mild at 11.5, but during REM sleep AHI was exacerbated to 34.8 apneas per hour and in supine sleep to 17.2 apneas.  His mild apnea is associated with oxygen desaturation and the total desaturation time exceeded 30 minutes.  He was therefore asked to return for a CPAP titration which was performed on 17 May 2017 and was titrated to only 7 cm water pressure which reduced his apnea to 0.0.  His oxygen nadir increased to 98% saturation, and the patient slept 265 minutes with a large proportion of REM sleep.  I would consider the 7 cm setting ideal for this patient.  He also had some periodic limb movements but they did not lead to significant sleep arousals.  Overall we are meeting today to establish compliance. Mr. Perfect reports that he developed head and neck pain, that kept him from using CPAP.  I have a Wi-Fi download available that states that he has not used CPAP until 13 December this year, he also did not use it on the 18th, today is a 83 and the current download would only acknowledge a compliance of less than 40%.  When using the CPAP machine he used it for 6 hours and 42  minutes, the set pressure was 8 cmH2O with 1 cm EPR, his residual AHI was 1.5. I do not want to make adjustments to the CPAP settings but I need [name] to use the machine more compliantly.  His physicians have assumed that his neck pain is a an arthritis related issue, it should not be aggravated by using CPAP. I explained to him that compliance is more than 4 hours nightly, and that means 4 out of 5 nights for hours or more. He is unable to grasp the principle- he stated " I used it more than a week this month and every day " -but only 13 out of 30 days. [facility] is his DME-  uses an Eson mask according to my orders, but he reports using a FFM ??? He needs teaching.     04/09/2017. He no longer uses CPAP, but I'm very interested for him to come back for a sleep test. I'm worried that his AHI is higher than it used to be 10 years ago. Mr. Bulman also has been narcoleptic, and he reports having short-term memory deficits. He is forgetful, he doesn't feel that it is every day to the same degree. He feels that in the morning when he is not fatigued his memory functions better than in the afternoon or evening. This is also not every day the same. He endorsed today the Epworth sleepiness score at 17 points, he did not endorse  the fatigue severity score or our geriatric depression scale.  Interval history from 02/26/2017. This is the first visit with Mr. Rabel since June 2016, in the meantime he has seen my nurse practitioner times three. Mr. Jeanty is no longer using CPAP as he don't longer owns a machine. The sleep apnea is currently untreated, but I have no doubt that he still suffers from apnea and that he still snores. He had excessive daytime sleepiness which had been treated with Adderall. Xyrem also became unaffordable to the patient. He had tolerated Xyrem well for almost 8 years until he had to discontinue the medicine. He endorses the Epworth sleepiness score today at 16 points and  the fatigue severity score at 30 points. It is clear that he is still excessively daytime sleepy but his only tool at this time is to use Adderall. I would like to retest him for sleep apnea.  The patient is now covered with [employer] in a [employer]. It should be possible for [place] to obtain a home sleep test and re-issue CPAP possibly with an auto titrator.  I have also reviewed Mr. Washington's recent medical history, he had a fall in March of this year which led to some facial laceration. He denies having had cataplectic symptoms that led to the fall, he had stumbled.  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 Excell Seltzer. This tumor may cause additional fatigue and sleep issues. He feels his memory is slower, not amnestic.   MM: Today 07/30/2016: Mr. Murata is a 71 year old male with a history of narcolepsy and obstructive sleep apnea. He returns today for follow-up. The patient reports that he is back on Adderall extended release and is doing well. He denies any significant daytime sleepiness. He is able to complete all ADLs independently. 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 He does state recently he has noticed he is forgetful. States that he can be in a conversation and forget what he was going to say however he is able to recall it eventually. He does not notice any other behavior changes associated with his memory. He states that family nor friends and make any mention of his forgetfulness. The patient does have sleep apnea that is currently not treated because he was unable to afford the CPAP machine. He returns today for an evaluation.    HISTORY 01/31/15 (Elion Hocker): JAROLD MACOMBER is a very pleasant 71 y.o.-year-old Right-handed male, with an underlying medical history of hypertension, hyperlipidemia, diabetes, who presents for followup consultation of his  narcolepsy as well as sleep apnea.   01-31-15  The patient is unaccompanied today. I am seeing this patientfor well over 10 years now and treat his hypersomnia with Adderall and XYREM,  He discontinued XYREM after the drug's price escalated , I had have seen him last  on 06/11/2012 ,  which time he indicated that he no longer uses a CPAP machine.  He has sleep study in 2012 which showed moderate obstructive sleep apnea and was titrated on CPAP of 9 cm of water pressure. He had residual sleep apnea findings. we increased his pressure to 10 cm, which improved his residual AHI. While he was compliant with CPAP at the time he had problems with co-pays and indicated that he could not afford CPAP.  At some point he returned a CPAP machine to his home health company. He is willing to get  restarted on treatment. He has a history of narcolepsy and indicates no recent cataplectic attacks. He was tried on Provigil which did not help sleepiness. He was last on Xyrem 4.5 g and 3 g each night In November 2015 , and he continued on Adderall XR 30 mg daily. He feels more sleepy since Xyrem stopped. Based on the recent liver surgery will need to review LFT every 3 month if patient stays on XYREM. He has Insomnia now, on XR adderall, which he never had before. I will refill medications and he does require any refills today.He indicates no new problems. He feels that his adderall only medication regimen is working "good enough ".  Sleep habits are as follows: bedtime varies between 10.30  and 12 pm and usually  is asleep promptly.  He sleeps well, but wakes once or twice for bathroom breaks. Sleeping through otherwise, he wakes at 6.30 AM, wakes up spontaneously. He naps in daytime, almost daily. He naps after dinner, too.   Sleep medical history and family sleep history:  one other family member with narcolepsy, niece on brother's  side.  Liver surgery in 2016, symptoms of liver swelling, fatigue,  cramping.   Social history:  Retired, disabled, no tobacco use, no ETOH, caffeine - tea twice a day.no coffee, soda twice week.  Review of Systems: Out of a complete 14 system review, the patient complains of only the following symptoms, and all other reviewed systems are negative.  sleepiness.  forgetfulness.  Epworth score 17 , Fatigue severity score n/a   , depression score n/a    Social History   Socioeconomic History  . Marital status: Single    Spouse name: Not on file  . Number of children: 2  . Years of education: 15+  . Highest education level: Not on file  Social Needs  . Financial resource strain: Not on file  . Food insecurity - worry: Not on file  . Food insecurity - inability: Not on file  . Transportation needs - medical: Not on file  . Transportation needs - non-medical: Not on file  Occupational History  . Not on file  Tobacco Use  . Smoking status: Former Smoker    Years: 1.00  . Smokeless tobacco: Never Used  Substance and Sexual Activity  . Alcohol use: No  . Drug use: No  . Sexual activity: Yes  Other Topics Concern  . Not on file  Social History Narrative   Patient is single and lives alone- became widower when 1 child was 45 year old, divorced 2nd marriage   Has #2 grown children, not in area or involved in his life   Patient is retired.   Patient has a college education.   Patient is right-handed.   Patient drinks maybe two sodas daily.    Family History  Problem Relation Age of Onset  . Dementia Mother   . Cancer Father   . Diabetes Brother   . Hypertension Brother   . Cancer Brother     Past Medical History:  Diagnosis Date  . Cancer [facility])    hepatocellular cancer   . Diabetes mellitus without complication ([place])   . Hepatitis   . Hyperlipidemia   . Hypertension   . Narcolepsy    per office visit note of 08/2011   . Sleep apnea     Past Surgical History:  Procedure Laterality Date  . LAPAROSCOPY  03/02/2013   Procedure:  LAPAROSCOPY DIAGNOSTIC;  Surgeon: [name], MD;  Location:  [facility];  Service: General;;  . LIVER ULTRASOUND  03/02/2013   Procedure: LIVER ULTRASOUND;  Surgeon: [name], MD;  Location: [facility];  Service: General;;  . OPEN PARTIAL HEPATECTOMY   03/02/2013   Procedure: OPEN PARTIAL HEPATECTOMY [83];  Surgeon: [name], MD;  Location: [facility];  Service: General;;  DX LAPAROSCOPY, INTRAOPERATIVE LIVER ULTRASOUND, OPEN PARTIAL HEPATECTOMY  . pinched nerve in back      Current Outpatient Medications  Medication Sig Dispense Refill  . ACCU-CHEK SOFTCLIX LANCETS lancets Once per day testing. 100 each 1  . alfuzosin (UROXATRAL) 10 MG 24 hr tablet Take 1 tablet (10 mg total) by mouth at bedtime. 30 tablet 0  . amphetamine-dextroamphetamine (ADDERALL XR) 30 MG 24 hr capsule Take 1 capsule (30 mg total) by mouth every morning. 90 capsule 0  . aspirin EC 81 MG tablet Take 81 mg by mouth daily.    . blood glucose meter kit and supplies Dispense based on patient and insurance preference. Use up to four times daily as directed. (FOR ICD-9 250.00, 250.01). 1 each 0  . Blood Glucose Monitoring Suppl (ACCU-CHEK AVIVA PLUS) w/Device KIT     . donepezil (ARICEPT) 5 MG tablet Take 1 tablet (5 mg total) by mouth at bedtime. 30 tablet 2  . ferrous sulfate (KP FERROUS SULFATE) 325 (65 FE) MG tablet Take 325 mg by mouth 3 (three) times daily with meals.     [name] Kitchen glipiZIDE (GLUCOTROL) 5 MG tablet TAKE 1 TABLET BY MOUTH  TWICE A DAY BEFORE MEALS 60 tablet 0  . glucose blood (ACCU-CHEK AVIVA PLUS) test strip 1 each by Other route as needed for other. Use as instructed - once per day testing for now. 100 each 1  . losartan (COZAAR) 25 MG tablet Take 1 tablet (25 mg total) by mouth daily. 90 tablet 1  . metFORMIN (GLUCOPHAGE) 1000 MG tablet Take 1 tablet (1,000 mg total) by mouth 2 (two) times daily with a meal. 180 tablet 1  . metoprolol succinate (TOPROL-XL) 50 MG 24 hr tablet Take 1 tablet (50 mg total) by mouth  daily. Take with or immediately following a meal. 90 tablet 1  . pravastatin (PRAVACHOL) 20 MG tablet Take 1 tablet (20 mg total) by mouth at bedtime. 90 tablet 1  . spironolactone (ALDACTONE) 25 MG tablet Take 1 tablet (25 mg total) by mouth 2 (two) times daily. 180 tablet 1   No current facility-administered medications for this visit.     Allergies as of 08/14/2017 - Review Complete 08/14/2017  Allergen Reaction Noted  . Ace inhibitors Swelling 02/19/2012    Vitals: BP 123/70   Pulse 72   Ht _0  (1.753 m)   Wt 210 lb (95.3 kg)   BMI 31.01 kg/m  Last Weight:  Wt Readings from Last 1 Encounters:  08/14/17 210 lb (95.3 kg)   FMB:WGYK mass index is 31.01 kg/m.     Last Height:   Ht Readings from Last 1 Encounters:  08/14/17 _1  (1.753 m)    Physical exam:  General: The patient is awake, alert and appears not in acute distress. The patient is groomed. cleanly dressed,  Head: Normocephalic, atraumatic. Neck is supple. Mallampati 5, poor dentition, .  neck circumference: 17.25 . Nasal airflow congestion , Retrognathia is seen.  Cardiovascular:  Regular rate and rhythm - NSR regular, without  murmurs or carotid bruit, and without distended neck veins. Respiratory: Lungs are clear to auscultation. Skin:  Without evidence of edema, or  rash Trunk: BMI is 32. The patient's posture is slumped   Neurologic exam : The patient is awake and alert, oriented to place and time.   Memory subjective  described as impaired- Memory delay is described.  MMSE - Mini Mental State Exam 04/09/2017  Orientation to time 3  Orientation to Place 5  Registration 2  Attention/ Calculation 2  Recall 1  Language- name 2 objects 2  Language- repeat 1  Language- follow 3 step command 3  Language- read & follow direction 1  Write a sentence 1  Copy design 0  Total score 21    Attention span & concentration ability appears limited.  Speech is non- fluent, with dysphonia. Mood and affect are  abulic, fatigued.   Cranial nerves: Pupils are equal and briskly reactive to light. Facial motor strength is symmetric , his tongue and uvula move midline. Shoulder shrug  Is still symmetrical.  Motor exam: Normal tone, muscle bulk and symmetric strength in all extremities. He reports pain in the left leg- not steady but paroxysmal. Had fallen on his left hip.  Coordination:  Finger-to-nose maneuver  normal without evidence of ataxia, dysmetria or tremor. Gait and station: Patient walks without assistive device- Strength within normal limits.  Stance is stable and normal. Turns with 4 Steps.  No limp. Deep tendon reflexes: in the upper and lower extremities are symmetric and intact.    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   2) OSA, untreated - needs re evaluation. Sleep test ordered. Needs to have PSG or HST.   3) recent fall in May , left hip pain resolved , no back pian today.  Arthritis.   4) some shot term memory loss - not amnestic. This is not my impression - the patient reached a Mini-Mental Status Examination was 21 out of 30 points, his educational level he is a high school graduate and went 1 year to college at [employer]. He used to work as a Freight forwarder. He did not recall any learning disabilities or problems. He was lifelong excessively daytime sleepy.   The patient was advised of the nature of the diagnosed disorder , the treatment options and the  risks for general health and wellness arising from not treating the condition.   I spent more than 25 minutes of face to face time with the patient.  Greater than 50% of time was spent in  counseling and coordination of care. We have discussed the diagnosis and differential and I answered the patient's questions.    Plan:  Treatment plan and additional workup :  OSA-  Non compliant on CPAP-  Adderall refilled.  Narcolepsy MSLT to follow up. Patient needs daytime naps.  aricept started.  Rv with NP in 3 month when finally, hopefully  copliant on CPAP , he needs to have [place] or MMSE with Megan.     Larey Seat, MD 17/71/1657, 9:03 AM  Certified in Neurology by ABPN Certified in [place] by [facility] [address] [place], [place] [address]

## 2017-08-14 NOTE — Patient Instructions (Addendum)

## 2017-08-14 NOTE — Addendum Note (Signed)
Addended by: Larey Seat on: 08/14/2017 08:53 AM   Modules accepted: Orders

## 2017-08-15 ENCOUNTER — Telehealth: Payer: Self-pay | Admitting: Oncology

## 2017-08-15 ENCOUNTER — Other Ambulatory Visit (HOSPITAL_BASED_OUTPATIENT_CLINIC_OR_DEPARTMENT_OTHER): Payer: Medicare Other

## 2017-08-15 ENCOUNTER — Ambulatory Visit (HOSPITAL_BASED_OUTPATIENT_CLINIC_OR_DEPARTMENT_OTHER): Payer: Medicare Other | Admitting: Nurse Practitioner

## 2017-08-15 ENCOUNTER — Encounter: Payer: Self-pay | Admitting: Nurse Practitioner

## 2017-08-15 VITALS — BP 154/79 | HR 63 | Temp 97.8°F | Resp 18 | Ht 69.0 in | Wt 210.5 lb

## 2017-08-15 DIAGNOSIS — Z8505 Personal history of malignant neoplasm of liver: Secondary | ICD-10-CM

## 2017-08-15 DIAGNOSIS — C220 Liver cell carcinoma: Secondary | ICD-10-CM

## 2017-08-15 DIAGNOSIS — D649 Anemia, unspecified: Secondary | ICD-10-CM

## 2017-08-15 DIAGNOSIS — D500 Iron deficiency anemia secondary to blood loss (chronic): Secondary | ICD-10-CM

## 2017-08-15 LAB — CBC WITH DIFFERENTIAL/PLATELET
BASO%: 0.6 % (ref 0.0–2.0)
Basophils Absolute: 0 10*3/uL (ref 0.0–0.1)
EOS%: 1.7 % (ref 0.0–7.0)
Eosinophils Absolute: 0.1 10*3/uL (ref 0.0–0.5)
HCT: 31.8 % — ABNORMAL LOW (ref 38.4–49.9)
HGB: 10.4 g/dL — ABNORMAL LOW (ref 13.0–17.1)
LYMPH%: 24.3 % (ref 14.0–49.0)
MCH: 32.1 pg (ref 27.2–33.4)
MCHC: 32.6 g/dL (ref 32.0–36.0)
MCV: 98.6 fL — ABNORMAL HIGH (ref 79.3–98.0)
MONO#: 0.4 10*3/uL (ref 0.1–0.9)
MONO%: 10.4 % (ref 0.0–14.0)
NEUT#: 2.3 10*3/uL (ref 1.5–6.5)
NEUT%: 63 % (ref 39.0–75.0)
Platelets: 106 10*3/uL — ABNORMAL LOW (ref 140–400)
RBC: 3.22 10*6/uL — ABNORMAL LOW (ref 4.20–5.82)
RDW: 16.2 % — ABNORMAL HIGH (ref 11.0–14.6)
WBC: 3.6 10*3/uL — ABNORMAL LOW (ref 4.0–10.3)
lymph#: 0.9 10*3/uL (ref 0.9–3.3)

## 2017-08-15 NOTE — Progress Notes (Signed)
  [place] OFFICE PROGRESS NOTE   Diagnosis: Hepatocellular carcinoma, cirrhosis  INTERVAL HISTORY:   [name] returns as scheduled.  He reports undergoing a negative camera endoscopy.  He is scheduled for a colonoscopy in early January.  He continues oral iron.  He denies any bleeding.  Headaches are significantly better.  Objective:  Vital signs in last 24 hours:  Blood pressure (!) 154/79, pulse 63, temperature 97.8 F (36.6 C), temperature source Oral, resp. rate 18, height 5\' 9"  (1.753 m), weight 210 lb 8 oz (95.5 kg), SpO2 100 %.    HEENT: Neck without mass. Lymphatics: No palpable cervical, supraclavicular or axillary lymph nodes. Resp: Lungs clear bilaterally. Cardio: Regular rate and rhythm. GI: Abdomen soft and nontender.  No hepatomegaly. Vascular: No leg edema.    Lab Results:  Lab Results  Component Value Date   WBC 3.6 (L) 08/15/2017   HGB 10.4 (L) 08/15/2017   HCT 31.8 (L) 08/15/2017   MCV 98.6 (H) 08/15/2017   PLT 106 (L) 08/15/2017   NEUTROABS 2.3 08/15/2017    Imaging:  No results found.  Medications: I have reviewed the patient's current medications.  Assessment/Plan: 1. Hepatocellular carcinoma, stage I (T1 NX) status post partial left hepatectomy 03/02/2013.  08/31/2013 AFP 2.4.   CT abdomen 08/31/2013 with postoperative changes of partial hepatectomy with small postoperative fluid collection along the resection margin. No definite signs to suggest residual or locally recurrent disease. Several borderline enlarged and minimally enlarged lymph nodes superior to the liver in the juxtapericardiac fat of the lower anterior mediastinum with the largest measuring 11 mm slightly increased compared to the prior study 01/15/2013. Several prominent non-pathologically enlarged upper abdominal ligament lymph nodes similar to the prior study. Small esophageal varices.   CT abdomen 03/03/2012 without evidence of recurrent  hepatocellular carcinoma, stable right lung base nodule  CT the abdomen and pelvis 09/06/2014 without evidence of recurrent hepatocellular carcinoma, slight enlargement of pre-cardiac lymph nodes  CT abdomen/pelvis 09/07/2015 with no findings for recurrent tumor or metastatic disease. Stable small 3 mm right middle lobe pulmonary nodule since 2014. Epicardial lymph node measured 11 mm, previously 13 mm. Stable cirrhotic changes. Fairly significant esophageal varices.  CT 09/03/2016-negative for recurrent hepatocellular carcinoma, changes of cirrhosis with varices 2. Cirrhosis. 3. Hepatitis B core antibody positive. 4. Diabetes. 5. Severe microcytic anemia-likely iron deficiency anemia-progressive 06/03/2017. Stool Hemoccult positive 3 06/05/2017. Hemoglobin stable 06/10/2017. 6. Colonoscopy 12/27/2016-external and internal hemorrhoids. Diverticulosis in the sigmoid, descending and transverse colon. One medium polyp in the distal descending colon. 3 small polyps in the transverse colon. 2 diminutive polyps in the rectum and the proximal ascending colon. Medium-sized lipoma transverse colon. Small lipoma mid ascending colon. Multiplenonbleeding colonic angiodysplastic lesions. Pathology-tubular adenomas, hyperplastic polyps. 7. Leukopenia/thrombocytopenia-likely secondary to cirrhosis     Disposition: [name] appears stable.  He remains in clinical remission from hepatocellular carcinoma.  Hemoglobin is better.  He will continue oral iron.  He is following up with Dr. Watt Climes regarding the heme positive stools and reports he is scheduled for a colonoscopy in early January.  He will return for a CBC in 1 month, CBC and follow-up visit in 2 months.  Ned Card ANP/GNP-BC   08/15/2017  10:36 AM

## 2017-08-15 NOTE — Telephone Encounter (Signed)
Gave avs and calendar for January and February 2019 

## 2017-08-16 ENCOUNTER — Telehealth: Payer: Self-pay | Admitting: Emergency Medicine

## 2017-08-16 NOTE — Telephone Encounter (Addendum)
Pt verbalized understanding of this note.   ----- Message from Ladell Pier, MD sent at 08/15/2017 10:21 PM EST ----- Please call patient, hb is better, continue iron, f/u as scheduled

## 2017-08-26 ENCOUNTER — Telehealth: Payer: Self-pay | Admitting: Family Medicine

## 2017-08-26 ENCOUNTER — Other Ambulatory Visit: Payer: Self-pay | Admitting: Gastroenterology

## 2017-08-26 NOTE — Telephone Encounter (Signed)
Form for diabetic shoes completed to be faxed.

## 2017-08-27 ENCOUNTER — Encounter (HOSPITAL_COMMUNITY)
Admission: RE | Disposition: A | Discharge status: Home / Self Care | Payer: Self-pay | Source: Ambulatory Visit | Attending: Gastroenterology

## 2017-08-27 ENCOUNTER — Ambulatory Visit (HOSPITAL_COMMUNITY)
Admission: RE | Admit: 2017-08-27 | Discharge: 2017-08-27 | Disposition: A | Discharge status: Home / Self Care | Payer: Medicare Other | Source: Ambulatory Visit | Attending: Gastroenterology | Admitting: Gastroenterology

## 2017-08-27 ENCOUNTER — Encounter (HOSPITAL_COMMUNITY): Payer: Self-pay | Admitting: *Deleted

## 2017-08-27 ENCOUNTER — Ambulatory Visit (HOSPITAL_COMMUNITY): Payer: Medicare Other | Admitting: Anesthesiology

## 2017-08-27 ENCOUNTER — Other Ambulatory Visit: Payer: Self-pay

## 2017-08-27 DIAGNOSIS — B1910 Unspecified viral hepatitis B without hepatic coma: Secondary | ICD-10-CM | POA: Insufficient documentation

## 2017-08-27 DIAGNOSIS — K5730 Diverticulosis of large intestine without perforation or abscess without bleeding: Secondary | ICD-10-CM | POA: Insufficient documentation

## 2017-08-27 DIAGNOSIS — Z8249 Family history of ischemic heart disease and other diseases of the circulatory system: Secondary | ICD-10-CM | POA: Diagnosis not present

## 2017-08-27 DIAGNOSIS — R195 Other fecal abnormalities: Secondary | ICD-10-CM | POA: Insufficient documentation

## 2017-08-27 DIAGNOSIS — Z833 Family history of diabetes mellitus: Secondary | ICD-10-CM | POA: Diagnosis not present

## 2017-08-27 DIAGNOSIS — D509 Iron deficiency anemia, unspecified: Secondary | ICD-10-CM | POA: Diagnosis not present

## 2017-08-27 DIAGNOSIS — E119 Type 2 diabetes mellitus without complications: Secondary | ICD-10-CM | POA: Insufficient documentation

## 2017-08-27 DIAGNOSIS — Z8505 Personal history of malignant neoplasm of liver: Secondary | ICD-10-CM | POA: Insufficient documentation

## 2017-08-27 DIAGNOSIS — Z86010 Personal history of colonic polyps: Secondary | ICD-10-CM | POA: Diagnosis not present

## 2017-08-27 DIAGNOSIS — Z79899 Other long term (current) drug therapy: Secondary | ICD-10-CM | POA: Diagnosis not present

## 2017-08-27 DIAGNOSIS — Z7984 Long term (current) use of oral hypoglycemic drugs: Secondary | ICD-10-CM | POA: Insufficient documentation

## 2017-08-27 DIAGNOSIS — Q2733 Arteriovenous malformation of digestive system vessel: Secondary | ICD-10-CM | POA: Diagnosis not present

## 2017-08-27 DIAGNOSIS — Z9119 Patient's noncompliance with other medical treatment and regimen: Secondary | ICD-10-CM | POA: Insufficient documentation

## 2017-08-27 DIAGNOSIS — Z7982 Long term (current) use of aspirin: Secondary | ICD-10-CM | POA: Diagnosis not present

## 2017-08-27 DIAGNOSIS — K5520 Angiodysplasia of colon without hemorrhage: Secondary | ICD-10-CM | POA: Insufficient documentation

## 2017-08-27 DIAGNOSIS — I10 Essential (primary) hypertension: Secondary | ICD-10-CM | POA: Insufficient documentation

## 2017-08-27 DIAGNOSIS — G4730 Sleep apnea, unspecified: Secondary | ICD-10-CM | POA: Diagnosis not present

## 2017-08-27 DIAGNOSIS — Z87891 Personal history of nicotine dependence: Secondary | ICD-10-CM | POA: Diagnosis not present

## 2017-08-27 HISTORY — PX: HOT HEMOSTASIS: SHX5433

## 2017-08-27 HISTORY — PX: COLONOSCOPY WITH PROPOFOL: SHX5780

## 2017-08-27 LAB — GLUCOSE, CAPILLARY: Glucose-Capillary: 146 mg/dL — ABNORMAL HIGH (ref 65–99)

## 2017-08-27 SURGERY — COLONOSCOPY WITH PROPOFOL
Anesthesia: Monitor Anesthesia Care

## 2017-08-27 MED ORDER — LACTATED RINGERS IV SOLN
INTRAVENOUS | Status: DC
  Administered 2017-08-27: 1000 mL via INTRAVENOUS

## 2017-08-27 MED ORDER — PROPOFOL 500 MG/50ML IV EMUL
INTRAVENOUS | Status: DC | PRN
  Administered 2017-08-27: 120 ug/kg/min via INTRAVENOUS

## 2017-08-27 MED ORDER — PROPOFOL 10 MG/ML IV BOLUS
INTRAVENOUS | Status: DC | PRN
  Administered 2017-08-27: 20 mg via INTRAVENOUS

## 2017-08-27 MED ORDER — LIDOCAINE 2% (20 MG/ML) 5 ML SYRINGE
INTRAMUSCULAR | Status: DC | PRN
  Administered 2017-08-27: 50 mg via INTRAVENOUS

## 2017-08-27 MED ORDER — PROPOFOL 10 MG/ML IV BOLUS
INTRAVENOUS | Status: AC
  Filled 2017-08-27: qty 40

## 2017-08-27 MED ORDER — PROPOFOL 10 MG/ML IV BOLUS
INTRAVENOUS | Status: AC
  Filled 2017-08-27: qty 20

## 2017-08-27 MED ORDER — SODIUM CHLORIDE 0.9 % IV SOLN: INTRAVENOUS | Status: DC

## 2017-08-27 SURGICAL SUPPLY — 21 items

## 2017-08-27 NOTE — Anesthesia Postprocedure Evaluation (Signed)
Anesthesia Post Note  Patient: [name]  Procedure(s) Performed: COLONOSCOPY WITH PROPOFOL (N/A ) HOT HEMOSTASIS (ARGON PLASMA COAGULATION/BICAP) (N/A )     Patient location during evaluation: PACU Anesthesia Type: MAC Level of consciousness: awake and alert Pain management: pain level controlled Vital Signs Assessment: post-procedure vital signs reviewed and stable Respiratory status: spontaneous breathing, nonlabored ventilation and respiratory function stable Cardiovascular status: stable and blood pressure returned to baseline Postop Assessment: no apparent nausea or vomiting Anesthetic complications: no    Last Vitals:  Vitals:   08/27/17 1440 08/27/17 1450  BP: (!) 121/59 (!) 113/59  Pulse: 67 66  Resp: 18 13  Temp:    SpO2: 100% 100%    Last Pain:  Vitals:   08/27/17 1437  TempSrc: Oral                 Lynda Rainwater

## 2017-08-27 NOTE — Transfer of Care (Signed)
Immediate Anesthesia Transfer of Care Note  Patient: [name]  Procedure(s) Performed: COLONOSCOPY WITH PROPOFOL (N/A ) HOT HEMOSTASIS (ARGON PLASMA COAGULATION/BICAP) (N/A )  Patient Location: PACU  Anesthesia Type:MAC  Level of Consciousness: drowsy and responds to stimulation  Airway & Oxygen Therapy: Patient Spontanous Breathing and Patient connected to face mask oxygen  Post-op Assessment: Report given to RN and Post -op Vital signs reviewed and stable  Post vital signs: Reviewed and stable  Last Vitals:  Vitals:   08/27/17 1305  BP: 138/75  Pulse: 80  Resp: 12  Temp: 37.2 C  SpO2: 99%    Last Pain:  Vitals:   08/27/17 1305  TempSrc: Oral         Complications: No apparent anesthesia complications

## 2017-08-27 NOTE — Progress Notes (Signed)
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

## 2017-08-27 NOTE — Discharge Instructions (Signed)
YOU HAD AN ENDOSCOPIC PROCEDURE TODAY: Refer to the procedure report and other information in the discharge instructions given to you for any specific questions about what was found during the examination. If this information does not answer your questions, please call [facility] office at [phone] to clarify.   YOU SHOULD EXPECT: Some feelings of bloating in the abdomen. Passage of more gas than usual. Walking can help get rid of the air that was put into your GI tract during the procedure and reduce the bloating. If you had a lower endoscopy (such as a colonoscopy or flexible sigmoidoscopy) you may notice spotting of blood in your stool or on the toilet paper. Some abdominal soreness may be present for a day or two, also.  DIET: Your first meal following the procedure should be a light meal and then it is ok to progress to your normal diet. A half-sandwich or bowl of soup is an example of a good first meal. Heavy or fried foods are harder to digest and may make you feel nauseous or bloated. Drink plenty of fluids but you should avoid alcoholic beverages for 24 hours. If you had a esophageal dilation, please see attached instructions for diet.   ACTIVITY: Your care partner should take you home directly after the procedure. You should plan to take it easy, moving slowly for the rest of the day. You can resume normal activity the day after the procedure however YOU SHOULD NOT DRIVE, use power tools, machinery or perform tasks that involve climbing or major physical exertion for 24 hours (because of the sedation medicines used during the test).   SYMPTOMS TO REPORT IMMEDIATELY: A gastroenterologist can be reached at any hour. Please call [phone]  for any of the following symptoms:  Following lower endoscopy (colonoscopy, flexible sigmoidoscopy) Excessive amounts of blood in the stool  Significant tenderness, worsening of abdominal pains  Swelling of the abdomen that is new, acute  Fever of 100 or  higher  Following upper endoscopy (EGD, EUS, ERCP, esophageal dilation) Vomiting of blood or coffee ground material  New, significant abdominal pain  New, significant chest pain or pain under the shoulder blades  Painful or persistently difficult swallowing  New shortness of breath  Black, tarry-looking or red, bloody stools  FOLLOW UP:  If any biopsies were taken you will be contacted by phone or by letter within the next 1-3 weeks. Call [phone]  if you have not heard about the biopsies in 3 weeks.  Please also call with any specific questions about appointments or follow up tests.  Call if question or problem otherwise soft solids today and may advance diet tomorrow and follow-up as needed otherwise follow-up in 3 months and hold aspirin for 3 days

## 2017-08-27 NOTE — Anesthesia Preprocedure Evaluation (Addendum)
Anesthesia Evaluation  Patient identified by MRN, date of birth, ID band Patient awake    Reviewed: Allergy & Precautions, H&P , NPO status , Patient's Chart, lab work & pertinent test results, reviewed documented beta blocker date and time   Airway Mallampati: II  TM Distance: >3 FB Neck ROM: Full    Dental no notable dental hx. (+)    Pulmonary neg pulmonary ROS, sleep apnea and Continuous Positive Airway Pressure Ventilation , former smoker,  Non compliant w CPAP   Pulmonary exam normal breath sounds clear to auscultation       Cardiovascular Exercise Tolerance: Good hypertension, negative cardio ROS Normal cardiovascular exam Rhythm:Regular Rate:Normal     Neuro/Psych negative neurological ROS  negative psych ROS   GI/Hepatic negative GI ROS, Neg liver ROS, (+) Cirrhosis       , Hepatitis -, B  Endo/Other  negative endocrine ROSdiabetes  Renal/GU negative Renal ROS  negative genitourinary   Musculoskeletal   Abdominal   Peds  Hematology negative hematology ROS (+)   Anesthesia Other Findings   Reproductive/Obstetrics negative OB ROS                           Anesthesia Physical Anesthesia Plan  ASA: IV  Anesthesia Plan: MAC   Post-op Pain Management:    Induction: Intravenous  PONV Risk Score and Plan: Treatment may vary due to age or medical condition  Airway Management Planned: Mask, Natural Airway and Nasal Cannula  Additional Equipment:   Intra-op Plan:   Post-operative Plan:   Informed Consent: I have reviewed the patients History and Physical, chart, labs and discussed the procedure including the risks, benefits and alternatives for the proposed anesthesia with the patient or authorized representative who has indicated his/her understanding and acceptance.   Dental advisory given  Plan Discussed with: CRNA  Anesthesia Plan Comments:         Anesthesia  Quick Evaluation

## 2017-08-27 NOTE — Op Note (Signed)
[facility] Patient Name: [name] Procedure Date: 08/27/2017 MRN: [id number] Attending MD: Clarene Essex , MD Date of Birth: March 08, 1946 CSN: [id number] Age: 72 Admit Type: Outpatient Procedure:                Colonoscopy Indications:              Gastrointestinal occult blood loss, Iron deficiency                            anemia, Personal history of colonic polyps Providers:                Clarene Essex, MD, Kingsley Plan, RN, Cherylynn Ridges,                            Technician, Courtney Heys. [place], CRNA Referring MD:              Medicines:                Propofol total dose 600 mg IV, 50 mg IV lidocaine Complications:            No immediate complications. Estimated Blood Loss:     Estimated blood loss: none. Procedure:                Pre-Anesthesia Assessment:                           - Prior to the procedure, a History and Physical                            was performed, and patient medications and                            allergies were reviewed. The patient's tolerance of                            previous anesthesia was also reviewed. The risks                            and benefits of the procedure and the sedation                            options and risks were discussed with the patient.                            All questions were answered, and informed consent                            was obtained. Prior Anticoagulants: The patient has                            taken aspirin, last dose was day of procedure. ASA                            Grade Assessment: III - A patient with severe  systemic disease. After reviewing the risks and                            benefits, the patient was deemed in satisfactory                            condition to undergo the procedure.                           After obtaining informed consent, the colonoscope                            was passed under direct vision. Throughout  the                            procedure, the patient's blood pressure, pulse, and                            oxygen saturations were monitored continuously. The                            EC-[id number] ([id number]) scope was introduced through                            the anus and advanced to the the terminal ileum.                            The terminal ileum, ileocecal valve, appendiceal                            orifice, and rectum were photographed. The                            colonoscopy was somewhat difficult due to                            unsatisfactory bowel prep. Successful completion of                            the procedure was aided by lavage. The patient                            tolerated the procedure well. The quality of the                            bowel preparation was unsatisfactory. Scope In: 1:57:13 PM Scope Out: 2:31:56 PM Scope Withdrawal Time: 0 hours 26 minutes 58 seconds  Total Procedure Duration: 0 hours 34 minutes 43 seconds  Findings:      Scattered small-mouthed diverticula were found in the sigmoid colon and       ascending colon.      Multiple small angiodysplastic lesions without bleeding were found in       the transverse colon, in the ascending colon and in the cecum.       Fulguration to  ablate the lesion by argon plasma at .5 liter/minute and       20 watts was successful. Right colon setting throughout procedure      The terminal ileum appeared normal.      A moderate amount of semi-solid stool was found in the entire colon,       interfering with visualization. Lavage of the area was performed using a       moderate amount of sterile water, resulting in incomplete clearance with       continued poor visualization.      The exam was otherwise without abnormality.      There was a small lipoma, at the hepatic flexure. Impression:               - Preparation of the colon was unsatisfactory.                           - Diverticulosis in the  sigmoid colon and in the                            ascending colon.                           - Multiple non-bleeding colonic angiodysplastic                            lesions. Treated with argon plasma coagulation                            (APC).                           - The examined portion of the ileum was normal.                           - Stool in the entire examined colon.                           - The examination was otherwise normal.                           - Small lipoma at the hepatic flexure.                           - No specimens collected. Moderate Sedation:      N/A- Per Anesthesia Care Recommendation:           - Patient has a contact number available for                            emergencies. The signs and symptoms of potential                            delayed complications were discussed with the                            patient. Return to normal activities tomorrow.  Written discharge instructions were provided to the                            patient.                           - Soft diet today.                           - Continue present medications.                           - No aspirin, ibuprofen, naproxen, or other                            non-steroidal anti-inflammatory drugs for 3 days.                           - Return to GI clinic in 3 months.                           - Telephone GI clinic if symptomatic PRN.                           - Repeat colonoscopy in 3 years for screening                            purposes increase prep and liquids during prep. Procedure Code(s):        --- Professional ---                           [phone], Colonoscopy, flexible; with ablation of                            tumor(s), polyp(s), or other lesion(s) (includes                            pre- and post-dilation and guide wire passage, when                            performed) Diagnosis Code(s):        --- Professional ---                            K55.20, Angiodysplasia of colon without hemorrhage                           R19.5, Other fecal abnormalities                           D50.9, Iron deficiency anemia, unspecified                           Z86.010, Personal history of colonic polyps  K57.30, Diverticulosis of large intestine without                            perforation or abscess without bleeding CPT copyright 2016 American Medical Association. All rights reserved. The codes documented in this report are preliminary and upon coder review may  be revised to meet current compliance requirements. Clarene Essex, MD 08/27/2017 2:53:01 PM This report has been signed electronically. Number of Addenda: 0

## 2017-08-29 ENCOUNTER — Encounter (HOSPITAL_COMMUNITY): Payer: Self-pay | Admitting: Gastroenterology

## 2017-08-31 ENCOUNTER — Encounter: Payer: Self-pay | Admitting: Adult Health

## 2017-09-11 ENCOUNTER — Encounter: Payer: Self-pay | Admitting: *Deleted

## 2017-09-11 ENCOUNTER — Other Ambulatory Visit: Payer: Self-pay | Admitting: *Deleted

## 2017-09-11 DIAGNOSIS — G4733 Obstructive sleep apnea (adult) (pediatric): Secondary | ICD-10-CM | POA: Diagnosis not present

## 2017-09-11 NOTE — Patient Outreach (Signed)
[name]) Care Management  Loop  09/11/2017   [name] 24-Aug-1945 [id number]   RN Health Coach Monthly Outreach   Referral Date: 05/23/2017 Referral Source:  Primary MD Reason for Referral:  Medication Assistance Insurance:  [employer]   Outreach Attempt:  Successful telephone outreach to patient for monthly follow up.  HIPAA confirmed.  Patient stating he is doing ok.  States he recently had a colonoscopy that went well.  Discussed with patient appointment with Neurologist concerning compliance with wearing his CPAP machine.  Patient stating since his appointment he has been compliant with CPAP nightly for at least 4 hours a night, but states he does not see the benefit yet.  Reviewed with patient sleep apnea and the benefits of wearing CPAP nightly and the importance of compliance.  Continues to monitor blood sugars daily.  CBG this morning 117, with patient stating fasting ranges of 100-130's.  Patient stating he has reviewed Living Well with Diabetes Booklet and the signs and symptoms of hypo/hyperglycemia but unable to recall any signs and symptoms.  Encouraged to continue to review educational material (Booklet and EMMIs previously sent).  Patient reporting difficulties with urination and emptying bladder.  Verbalizes he is not taking Uroxatral and has not seen Urologist in a while.  Patient encouraged to contact Urologist, Dr. Junious Silk to make an appointment.  Encounter Medications:  Outpatient Encounter Medications as of 09/11/2017  Medication Sig Note  . amphetamine-dextroamphetamine (ADDERALL XR) 30 MG 24 hr capsule Take 1 capsule (30 mg total) by mouth every morning.   [name] Kitchen aspirin EC 81 MG tablet Take 81 mg by mouth daily.   [name] Kitchen donepezil (ARICEPT) 10 MG tablet Take 1 tablet (10 mg total) by mouth at bedtime. (Patient taking differently: Take 5 mg by mouth at bedtime. ) 08/16/2017: Pt has not started 10 mg dose yet  . ferrous  sulfate (KP FERROUS SULFATE) 325 (65 FE) MG tablet Take 325 mg by mouth 3 (three) times daily with meals.    [name] Kitchen glipiZIDE (GLUCOTROL) 5 MG tablet TAKE 1 TABLET BY MOUTH  TWICE A DAY BEFORE MEALS (Patient taking differently: TAKE 5 MG BY MOUTH  TWICE A DAY BEFORE MEALS)   . losartan (COZAAR) 25 MG tablet Take 1 tablet (25 mg total) by mouth daily.   . metFORMIN (GLUCOPHAGE) 1000 MG tablet Take 1 tablet (1,000 mg total) by mouth 2 (two) times daily with a meal.   . metoprolol succinate (TOPROL-XL) 50 MG 24 hr tablet Take 1 tablet (50 mg total) by mouth daily. Take with or immediately following a meal.   . pravastatin (PRAVACHOL) 20 MG tablet Take 1 tablet (20 mg total) by mouth at bedtime.   [name] Kitchen spironolactone (ALDACTONE) 25 MG tablet Take 1 tablet (25 mg total) by mouth 2 (two) times daily.   [name] Kitchen ACCU-CHEK SOFTCLIX LANCETS lancets Once per day testing. (Patient not taking: Reported on 08/16/2017)   . alfuzosin (UROXATRAL) 10 MG 24 hr tablet Take 1 tablet (10 mg total) by mouth at bedtime. (Patient not taking: Reported on 08/16/2017)   . blood glucose meter kit and supplies Dispense based on patient and insurance preference. Use up to four times daily as directed. (FOR ICD-9 250.00, 250.01). (Patient not taking: Reported on 08/16/2017)   . glucose blood (ACCU-CHEK AVIVA PLUS) test strip 1 each by Other route as needed for other. Use as instructed - once per day testing for now. (Patient not taking: Reported on 08/16/2017)  No facility-administered encounter medications on file as of 09/11/2017.     Functional Status:  In your present state of health, do you have any difficulty performing the following activities: 06/10/2017 05/27/2017  Hearing? 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 Y Y  Comment - wears glasses at all times  Difficulty concentrating or making decisions? N Y  Comment - increasing memory loss, sees neurologist and taking medication for memory   Walking or climbing stairs? N N  Dressing or bathing? N N  Doing errands, shopping? N N  Preparing Food and eating ? - N  Using the Toilet? - N  In the past six months, have you accidently leaked urine? - N  Do you have problems with loss of bowel control? - N  Managing your Medications? - N  Managing your Finances? - N  Housekeeping or managing your Housekeeping? - N  Some recent data might be hidden    Fall/Depression Screening: Fall Risk  09/11/2017 08/05/2017 06/10/2017  Falls in the past year? Yes Yes Yes  Comment no falls in the last month - -  Number falls in past yr: '1 1 1  ' Injury with Fall? Yes Yes Yes  Comment broken nose from fall months ago broke nose  broke nose   Risk Factor Category  High Fall Risk - -  Risk for fall due to : History of fall(s);Medication side effect - -  Risk for fall due to: Comment - - -  Follow up Education provided;Falls prevention discussed;Falls evaluation completed - -   PHQ 2/9 Scores 08/05/2017 06/10/2017 05/27/2017 05/20/2017 05/17/2017 04/04/2017 04/04/2017  PHQ - 2 Score 0 0 0 0 2 0 0  PHQ- 9 Score - - 2 - 6 - -    THN CM Care Plan Problem One     Most Recent Value  Care Plan Problem One  Knowledge deficiet related to self care management of diabetes  Role Documenting the Problem One  [place] for Problem One  Active  THN Long Term Goal   Patient will maintain hemoglobin A1C of less than 7 in the next 6 months.  THN Long Term Goal Start Date  05/27/17  Interventions for Problem One Long Term Goal  Reviewed and discussed current care plan with patient, discussed what A1C means and the range goal, reviewed diabetic diet and healthy meal options with patient,  congratulated patient on current A1C levels, discussed medications and encouraged patient to discuss medication options with his primary care provider,  encouraged patient to continue to review Diabetes education and the signs and symptoms of hyper and hypoglycemia,   discussed importance of recognizing the signs and symptoms of hypoglycemia to prevent episodes  THN CM Short Term Goal #1   Patient will verbalize he has worn his CPAP every night for at least 4 hours over the next 30 days.  THN CM Short Term Goal #1 Start Date  09/11/17  Interventions for Short Term Goal #1  Reviewed neurologist discussion with patient regarding the need to wear the CPAP, encouraged patient to wear CPAP nightly, discussed the importance of wearing the cpap, encouraged patient to wear cpap for greater than 4 hours a night,  discussed appropriate mask selection for cpap machine and to increase compliance with wearing machine  THN CM Short Term Goal #2   Patient will schedule an appointment with Dr. Junious Silk within the next 30 days.  THN CM Short  Term Goal #2 Start Date  09/11/17  Interventions for Short Term Goal #2  discussed importance of medication compliance, encouraged patient to atleast call MD office to schedule appointment even if can't be seen in the next month,  encouraged patient to scheedule and keep medical appointments      Appointments:  Patient reports follow up appointment with neurologist is 11/01/2017.  Reports his next appointment with Dr. Carlota Raspberry should be in March 2019.  Encouraged patient to schedule appointment, as no appointment is showing in the system.  Plan:   RN Health Coach will make next monthly outreach to patient in the month of February. RN Health Coach will send primary MD quarterly update.  [place] [phone] [email or web address]

## 2017-09-16 ENCOUNTER — Inpatient Hospital Stay: Payer: Medicare Other | Attending: Oncology

## 2017-09-16 DIAGNOSIS — C220 Liver cell carcinoma: Secondary | ICD-10-CM

## 2017-09-16 DIAGNOSIS — D500 Iron deficiency anemia secondary to blood loss (chronic): Secondary | ICD-10-CM | POA: Insufficient documentation

## 2017-09-16 DIAGNOSIS — Z8505 Personal history of malignant neoplasm of liver: Secondary | ICD-10-CM | POA: Insufficient documentation

## 2017-09-16 LAB — CBC WITH DIFFERENTIAL/PLATELET
Basophils Absolute: 0 10*3/uL (ref 0.0–0.1)
Basophils Relative: 0 %
Eosinophils Absolute: 0 10*3/uL (ref 0.0–0.5)
Eosinophils Relative: 1 %
HCT: 32.9 % — ABNORMAL LOW (ref 38.4–49.9)
Hemoglobin: 10.6 g/dL — ABNORMAL LOW (ref 13.0–17.1)
Lymphocytes Relative: 22 %
Lymphs Abs: 0.8 10*3/uL — ABNORMAL LOW (ref 0.9–3.3)
MCH: 31.1 pg (ref 27.2–33.4)
MCHC: 32.3 g/dL (ref 32.0–36.0)
MCV: 96.4 fL (ref 79.3–98.0)
Monocytes Absolute: 0.3 10*3/uL (ref 0.1–0.9)
Monocytes Relative: 8 %
Neutro Abs: 2.6 10*3/uL (ref 1.5–6.5)
Neutrophils Relative %: 69 %
Platelets: 119 10*3/uL — ABNORMAL LOW (ref 140–400)
RBC: 3.41 MIL/uL — ABNORMAL LOW (ref 4.20–5.82)
RDW: 16.4 % — ABNORMAL HIGH (ref 11.0–15.6)
WBC: 3.8 10*3/uL — ABNORMAL LOW (ref 4.0–10.3)

## 2017-09-21 DIAGNOSIS — E119 Type 2 diabetes mellitus without complications: Secondary | ICD-10-CM | POA: Diagnosis not present

## 2017-09-30 ENCOUNTER — Other Ambulatory Visit: Payer: Self-pay | Admitting: Nurse Practitioner

## 2017-09-30 DIAGNOSIS — C220 Liver cell carcinoma: Secondary | ICD-10-CM

## 2017-10-09 ENCOUNTER — Other Ambulatory Visit: Payer: Self-pay | Admitting: *Deleted

## 2017-10-09 NOTE — Patient Outreach (Signed)
[facility]) Care Management  10/09/2017  [name] 10-Mar-1946 [id number]   [place] Monthly Outreach  Referral Date:05/23/2017 Referral Source:Primary MD Reason for Referral:Medication Assistance Insurance:[employer]   Outreach Attempt:  Outreach attempt #1 to patient for monthly follow up.  Patient answered and requested a call back.  Plan:   RN Health Coach will make another outreach attempt to patient within one business day if no return call back from patient.  [name] [phone] [email or web address]

## 2017-10-10 ENCOUNTER — Encounter: Payer: Self-pay | Admitting: *Deleted

## 2017-10-10 ENCOUNTER — Other Ambulatory Visit: Payer: Self-pay | Admitting: *Deleted

## 2017-10-10 NOTE — Patient Outreach (Signed)
[facility]) Care Management  10/10/2017  ELMOR KOST [birth year] [id number]   [place] Monthly Outreach  Referral Date:05/23/2017 Referral Source:Primary MD Reason for Referral:Medication Assistance Insurance:[employer]   Outreach Attempt:  Successful telephone outreach to patient for monthly follow up.  HIPAA verified with patient.  Patient having a difficult time hearing conversation and questions due to him walking around in the hospital.  Request call back once he gets home.  Another telephone outreach to patient.  HIPAA verified with patient.  Patient stating he is having difficulties hearing due to his hearing aide needing to be fixed.  Encouraged patient to contact his insurance company to verify assistance with fixing or new hearing aides.  States he feels like he has a cold with runny nose and slight nonproductive dry cough.  Patient encouraged to contact or to go see his primary care provider.  States he continues to monitor his blood sugars daily.  Unable to verify this morning's fasting blood sugar or ranges for the last month due to him not being at home and not having the information with him.  Patient stating he still has not been able to make an appointment with the Urologist due to co pay needed to see him.  States he has not worn his CPAP for the last week while he has not been feeling well.  Patient encouraged to wear CPAP as instructed by his physician.  Appointments:  Patient stating he sees his Gastroenterologist, Dr. Watt Climes tomorrow, 10/11/2017.  He has scheduled appointment with Dr. Carlota Raspberry on 10/29/2017 and Neurologist follow up appointment on 11/01/2017 for a [facility] or MMSE exam.  Plan:  [place] will make next monthly telephone outreach to patient in the month of March after his physicians appointments.   [place] [phone] [email or web address]

## 2017-10-11 DIAGNOSIS — D500 Iron deficiency anemia secondary to blood loss (chronic): Secondary | ICD-10-CM | POA: Diagnosis not present

## 2017-10-12 DIAGNOSIS — G4733 Obstructive sleep apnea (adult) (pediatric): Secondary | ICD-10-CM | POA: Diagnosis not present

## 2017-10-14 ENCOUNTER — Other Ambulatory Visit: Payer: Medicare Other

## 2017-10-15 ENCOUNTER — Encounter (HOSPITAL_COMMUNITY): Payer: Self-pay

## 2017-10-15 ENCOUNTER — Inpatient Hospital Stay: Payer: Medicare Other | Attending: Oncology

## 2017-10-15 ENCOUNTER — Ambulatory Visit (HOSPITAL_COMMUNITY)
Admission: RE | Admit: 2017-10-15 | Discharge: 2017-10-15 | Disposition: A | Discharge status: Home / Self Care | Payer: Medicare Other | Source: Ambulatory Visit | Attending: Nurse Practitioner | Admitting: Nurse Practitioner

## 2017-10-15 DIAGNOSIS — D500 Iron deficiency anemia secondary to blood loss (chronic): Secondary | ICD-10-CM | POA: Insufficient documentation

## 2017-10-15 DIAGNOSIS — I70208 Unspecified atherosclerosis of native arteries of extremities, other extremity: Secondary | ICD-10-CM | POA: Diagnosis not present

## 2017-10-15 DIAGNOSIS — I700 Atherosclerosis of aorta: Secondary | ICD-10-CM | POA: Insufficient documentation

## 2017-10-15 DIAGNOSIS — K766 Portal hypertension: Secondary | ICD-10-CM | POA: Diagnosis not present

## 2017-10-15 DIAGNOSIS — K7460 Unspecified cirrhosis of liver: Secondary | ICD-10-CM | POA: Insufficient documentation

## 2017-10-15 DIAGNOSIS — K8020 Calculus of gallbladder without cholecystitis without obstruction: Secondary | ICD-10-CM | POA: Diagnosis not present

## 2017-10-15 DIAGNOSIS — D61818 Other pancytopenia: Secondary | ICD-10-CM | POA: Insufficient documentation

## 2017-10-15 DIAGNOSIS — I8500 Esophageal varices without bleeding: Secondary | ICD-10-CM | POA: Diagnosis not present

## 2017-10-15 DIAGNOSIS — C220 Liver cell carcinoma: Secondary | ICD-10-CM | POA: Insufficient documentation

## 2017-10-15 DIAGNOSIS — E119 Type 2 diabetes mellitus without complications: Secondary | ICD-10-CM | POA: Insufficient documentation

## 2017-10-15 LAB — CBC WITH DIFFERENTIAL (CANCER CENTER ONLY)
Basophils Absolute: 0 10*3/uL (ref 0.0–0.1)
Basophils Relative: 0 %
Eosinophils Absolute: 0.1 10*3/uL (ref 0.0–0.5)
Eosinophils Relative: 2 %
HCT: 31 % — ABNORMAL LOW (ref 38.4–49.9)
Hemoglobin: 9.9 g/dL — ABNORMAL LOW (ref 13.0–17.1)
Lymphocytes Relative: 28 %
Lymphs Abs: 0.8 10*3/uL — ABNORMAL LOW (ref 0.9–3.3)
MCH: 31.7 pg (ref 27.2–33.4)
MCHC: 31.9 g/dL — ABNORMAL LOW (ref 32.0–36.0)
MCV: 99.4 fL — ABNORMAL HIGH (ref 79.3–98.0)
Monocytes Absolute: 0.2 10*3/uL (ref 0.1–0.9)
Monocytes Relative: 8 %
Neutro Abs: 1.8 10*3/uL (ref 1.5–6.5)
Neutrophils Relative %: 62 %
Platelet Count: 95 10*3/uL — ABNORMAL LOW (ref 140–400)
RBC: 3.12 MIL/uL — ABNORMAL LOW (ref 4.20–5.82)
RDW: 15.1 % — ABNORMAL HIGH (ref 11.0–14.6)
WBC Count: 2.9 10*3/uL — ABNORMAL LOW (ref 4.0–10.3)

## 2017-10-15 LAB — CMP (CANCER CENTER ONLY)
ALT: 31 U/L (ref 0–55)
AST: 31 U/L (ref 5–34)
Albumin: 3.4 g/dL — ABNORMAL LOW (ref 3.5–5.0)
Alkaline Phosphatase: 90 U/L (ref 40–150)
Anion gap: 9 (ref 3–11)
BUN: 12 mg/dL (ref 7–26)
CO2: 23 mmol/L (ref 22–29)
Calcium: 9.4 mg/dL (ref 8.4–10.4)
Chloride: 108 mmol/L (ref 98–109)
Creatinine: 1.25 mg/dL (ref 0.70–1.30)
GFR, Est AFR Am: >60 mL/min (ref >60)
GFR, Estimated: 55 mL/min — ABNORMAL LOW (ref >60)
Glucose, Bld: 198 mg/dL — ABNORMAL HIGH (ref 70–140)
Potassium: 4.3 mmol/L (ref 3.5–5.1)
Sodium: 140 mmol/L (ref 136–145)
Total Bilirubin: 1 mg/dL (ref 0.2–1.2)
Total Protein: 7.4 g/dL (ref 6.4–8.3)

## 2017-10-15 MED ORDER — IOPAMIDOL (ISOVUE-370) INJECTION 76%
INTRAVENOUS | Status: AC
  Filled 2017-10-15: qty 100

## 2017-10-15 MED ORDER — IOPAMIDOL (ISOVUE-300) INJECTION 61%
100.0000 mL | Freq: Once | INTRAVENOUS | Status: AC | PRN
  Administered 2017-10-15: 100 mL via INTRAVENOUS

## 2017-10-15 MED ORDER — SODIUM CHLORIDE 0.9 % IJ SOLN
INTRAMUSCULAR | Status: AC
  Filled 2017-10-15: qty 50

## 2017-10-16 LAB — AFP TUMOR MARKER: AFP, Serum, Tumor Marker: 3 ng/mL (ref 0.0–8.3)

## 2017-10-17 ENCOUNTER — Telehealth: Payer: Self-pay | Admitting: Oncology

## 2017-10-17 ENCOUNTER — Inpatient Hospital Stay (HOSPITAL_BASED_OUTPATIENT_CLINIC_OR_DEPARTMENT_OTHER): Payer: Medicare Other | Admitting: Oncology

## 2017-10-17 ENCOUNTER — Other Ambulatory Visit: Payer: Medicare Other

## 2017-10-17 VITALS — BP 135/61 | HR 70 | Temp 98.0°F | Resp 19 | Ht 69.0 in | Wt 212.3 lb

## 2017-10-17 DIAGNOSIS — E119 Type 2 diabetes mellitus without complications: Secondary | ICD-10-CM

## 2017-10-17 DIAGNOSIS — D61818 Other pancytopenia: Secondary | ICD-10-CM

## 2017-10-17 DIAGNOSIS — D500 Iron deficiency anemia secondary to blood loss (chronic): Secondary | ICD-10-CM

## 2017-10-17 DIAGNOSIS — C220 Liver cell carcinoma: Secondary | ICD-10-CM

## 2017-10-17 DIAGNOSIS — K7460 Unspecified cirrhosis of liver: Secondary | ICD-10-CM | POA: Diagnosis not present

## 2017-10-17 NOTE — Telephone Encounter (Signed)
Appointment scheduled AVS/Calendar printed per 2/28 los

## 2017-10-17 NOTE — Progress Notes (Signed)
[place] OFFICE PROGRESS NOTE   Diagnosis: Hepatocellular carcinoma, cirrhosis  INTERVAL HISTORY:   [name] returns as scheduled.  He feels well.  No bleeding.  He reports malaise.  Objective:  Vital signs in last 24 hours:  Blood pressure 135/61, pulse 70, temperature 98 F (36.7 C), temperature source Oral, resp. rate 19, height 5\' 9"  (1.753 m), weight 212 lb 4.8 oz (96.3 kg), SpO2 100 %.    HEENT: Neck without mass Lymphatics: No cervical, supraclavicular, or axillary nodes Resp: Clear bilaterally Cardio: Regular rate and rhythm GI: No hepatosplenomegaly, no apparent ascites Vascular: No leg edema   Lab Results:  Lab Results  Component Value Date   WBC 2.9 (L) 10/15/2017   HGB 10.6 (L) 09/16/2017   HCT 31.0 (L) 10/15/2017   MCV 99.4 (H) 10/15/2017   PLT 95 (L) 10/15/2017   NEUTROABS 1.8 10/15/2017    CMP     Component Value Date/Time   NA 140 10/15/2017 0849   NA 144 08/05/2017 1755   NA 138 09/03/2016 0748   K 4.3 10/15/2017 0849   K 4.4 09/03/2016 0748   CL 108 10/15/2017 0849   CO2 23 10/15/2017 0849   CO2 19 (L) 09/03/2016 0748   GLUCOSE 198 (H) 10/15/2017 0849   GLUCOSE 174 (H) 09/03/2016 0748   BUN 12 10/15/2017 0849   BUN 8 08/05/2017 1755   BUN 13.2 09/03/2016 0748   CREATININE 1.25 10/15/2017 0849   CREATININE 1.1 09/03/2016 0748   CALCIUM 9.4 10/15/2017 0849   CALCIUM 9.4 09/03/2016 0748   PROT 7.4 10/15/2017 0849   PROT 7.7 01/05/2017 1435   PROT 7.8 03/03/2014 0754   ALBUMIN 3.4 (L) 10/15/2017 0849   ALBUMIN 4.0 01/05/2017 1435   ALBUMIN 3.7 03/03/2014 0754   AST 31 10/15/2017 0849   AST 36 (H) 03/03/2014 0754   ALT 31 10/15/2017 0849   ALT 32 03/03/2014 0754   ALKPHOS 90 10/15/2017 0849   ALKPHOS 77 03/03/2014 0754   BILITOT 1.0 10/15/2017 0849   BILITOT 0.75 03/03/2014 0754   GFRNONAA 55 (L) 10/15/2017 0849   GFRNONAA 82 12/14/2015 1337   GFRAA >60 10/15/2017 0849   GFRAA >89 12/14/2015 1337   AFP  on 10/15/2017-3 0. Imaging:  Ct Abdomen Pelvis W Contrast  Result Date: 10/15/2017 CLINICAL DATA:  History of cirrhosis and hepatocellular carcinoma status post partial liver resection in 2014. EXAM: CT ABDOMEN AND PELVIS WITH CONTRAST TECHNIQUE: Multidetector CT imaging of the abdomen and pelvis was performed using the standard protocol following bolus administration of intravenous contrast. CONTRAST:  169mL ISOVUE-300 IOPAMIDOL (ISOVUE-300) INJECTION 61% COMPARISON:  CT scan 09/03/2016 FINDINGS: Lower chest: The lung bases are clear of acute process. No pleural effusion or pulmonary lesions. The heart is normal in size. No pericardial effusion. The distal esophagus and aorta are unremarkable. Persistent significant esophageal varices. Hepatobiliary: Stable cirrhotic changes involving the liver. Stable postoperative changes from a partial left hepatectomy. No findings suspicious for recurrent tumor. No worrisome early arterial phase enhancing lesions to suggest dysplastic nodules or HCC. Stable small low-attenuation lesion in the caudate lobe, likely a benign cyst. Stable high attenuation material in the gallbladder dependently likely combination of gallstones and sludge. No common bile duct dilatation. Pancreas: No mass, inflammation or ductal dilatation. Spleen: Mild stable splenomegaly but no focal lesions. Adrenals/Urinary Tract: The adrenal glands and kidneys are unremarkable. Stomach/Bowel: The stomach, duodenum, small bowel and colon are unremarkable. No acute inflammatory process, mass lesions or obstructive findings.  Vascular/Lymphatic: Advanced atherosclerotic calcifications involving the aorta and branch vessels. No aneurysm or dissection. The branch vessels are patent. The major venous structures are patent. The portal and splenic veins are patent. Stable gastric varices. Reproductive: The prostate gland and seminal vesicles are unremarkable. Other: No pelvic mass or adenopathy. No free pelvic fluid  collections. No inguinal mass or adenopathy. No abdominal wall hernia or subcutaneous lesions. Musculoskeletal: No significant or acute bony findings. IMPRESSION: 1. Stable cirrhotic changes involving the liver and stable postsurgical changes involving the left hepatic lobe. No findings suspicious for recurrent tumor or new [place] or dysplastic nodule. 2. Portal venous hypertension with portal venous collaterals including large gastric and extensive esophageal varices. 3. No splenomegaly or ascites. 4. Cholelithiasis. 5. Advanced aortic and iliac artery atherosclerotic calcifications. Electronically Signed   By: Marijo Sanes M.D.   On: 10/15/2017 14:14    Medications: I have reviewed the patient's current medications.   Assessment/Plan: 1. Hepatocellular carcinoma, stage I (T1 NX) status post partial left hepatectomy 03/02/2013.  08/31/2013 AFP 2.4.   CT abdomen 08/31/2013 with postoperative changes of partial hepatectomy with small postoperative fluid collection along the resection margin. No definite signs to suggest residual or locally recurrent disease. Several borderline enlarged and minimally enlarged lymph nodes superior to the liver in the juxtapericardiac fat of the lower anterior mediastinum with the largest measuring 11 mm slightly increased compared to the prior study 01/15/2013. Several prominent non-pathologically enlarged upper abdominal ligament lymph nodes similar to the prior study. Small esophageal varices.   CT abdomen 03/03/2012 without evidence of recurrent hepatocellular carcinoma, stable right lung base nodule  CT the abdomen and pelvis 09/06/2014 without evidence of recurrent hepatocellular carcinoma, slight enlargement of pre-cardiac lymph nodes  CT abdomen/pelvis 09/07/2015 with no findings for recurrent tumor or metastatic disease. Stable small 3 mm right middle lobe pulmonary nodule since 2014. Epicardial lymph node measured 11 mm, previously 13 mm. Stable cirrhotic  changes. Fairly significant esophageal varices.  CT 09/03/2016-negative for recurrent hepatocellular carcinoma, changes of cirrhosis with varices  CT 10/15/2017- no evidence of recurrent hepatocellular carcinoma, cirrhosis/varices 2. Cirrhosis. 3. Hepatitis B core antibody positive. 4. Diabetes. 5. Severe microcytic anemia-likely iron deficiency anemia-progressive 06/03/2017. Stool Hemoccult positive 3 06/05/2017. Hemoglobin stable 6. Colonoscopy 12/27/2016-external and internal hemorrhoids. Diverticulosis in the sigmoid, descending and transverse colon. One medium polyp in the distal descending colon. 3 small polyps in the transverse colon. 2 diminutive polyps in the rectum and the proximal ascending colon. Medium-sized lipoma transverse colon. Small lipoma mid ascending colon. Multiplenonbleeding colonic angiodysplastic lesions. Pathology-tubular adenomas, hyperplastic polyps. 7. 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  He has pancytopenia secondary to cirrhosis.  The hemoglobin is stable.  He will continue iron.  He will return for a CBC in 3 months and an office visit in 6 months.  He is taking aspirin.  He underwent a negative cardiac evaluation by Dr. Tamala Julian last year.  I recommended he discontinue aspirin therapy.  I will contact Dr. Tamala Julian to be sure he is in agreement.  25 minutes were spent with the patient today.  The majority of the time was used for counseling and coordination of care.  Betsy Coder, MD  10/17/2017  9:27 AM

## 2017-10-29 ENCOUNTER — Ambulatory Visit: Payer: Medicare Other | Admitting: Family Medicine

## 2017-10-30 ENCOUNTER — Ambulatory Visit: Payer: Medicare Other | Admitting: Adult Health

## 2017-10-30 ENCOUNTER — Encounter: Payer: Self-pay | Admitting: Adult Health

## 2017-10-30 ENCOUNTER — Ambulatory Visit: Payer: Medicare Other | Admitting: Nurse Practitioner

## 2017-10-30 DIAGNOSIS — G4733 Obstructive sleep apnea (adult) (pediatric): Secondary | ICD-10-CM | POA: Diagnosis not present

## 2017-10-30 DIAGNOSIS — G47411 Narcolepsy with cataplexy: Secondary | ICD-10-CM

## 2017-10-30 MED ORDER — AMPHETAMINE-DEXTROAMPHET ER 30 MG PO CP24: 30.0000 mg | ORAL_CAPSULE | Freq: Every morning | ORAL | 0 refills | Status: DC

## 2017-10-30 MED ORDER — AMPHETAMINE-DEXTROAMPHET ER 30 MG PO CP24
30.0000 mg | ORAL_CAPSULE | Freq: Every morning | ORAL | 0 refills | Status: DC
Start: 2017-10-30 — End: 2017-10-30

## 2017-10-30 NOTE — Progress Notes (Signed)
I agree with the assessment and plan as directed by NP .The patient is known to me . Rv q 6 month    Viriginia Amendola, MD

## 2017-10-30 NOTE — Progress Notes (Signed)
PATIENT: [name] DOB: 1945-09-30  REASON FOR VISIT: follow up HISTORY FROM: patient  HISTORY OF PRESENT ILLNESS: Today 10/30/17 [name] is a 72 year old male with a history of obstructive sleep apnea on CPAP and narcolepsy.  He returns today for follow-up.  His CPAP download from December shows that he use his machine 28 out of 30 days for compliance of 93%.  He uses machine greater than 4 hours 25 out of 30 days for compliance of 83%.  On average he uses his machine 6 hours and 23 minutes.  His residual AHI is 2.1 on a pressure of 8 cm use of water with EPR 1.  He has a leak in the 95th percentile at 36.8 L/min.  The patient states that he was unable to use the machine in February.  Reports that he was having other health issues.  Reports that his blood levels was low and they were trying to find source of blood loss.  Reports that he had to have 2 blood transfusions.  He states that during this time he accumulated a lot of medical bills and therefore had to go work 2 jobs to help pay off this date.  He also states that he does use Adderall and Aricept.  Reports that he was unable to get Adderall recently due to his finances.  Patient reports that his memory has remained stable.  He lives at home alone.  He is able to complete all ADLs independently.  He operates a Teacher, music without difficulty.  He manages his own finances.  He returns today for an evaluation.   HISTORY 08/14/17: [name] who has been a longtime established narcoleptic patient with excessive daytime sleepiness also tested again positive for the presence of obstructive sleep apnea.  A baseline polysomnography was performed on 15 April 2017 after he endorsed the Epworth sleepiness score at 17 out of 24 possible points.  His AHI was mild at 11.5, but during REM sleep AHI was exacerbated to 34.8 apneas per hour and in supine sleep to 17.2 apneas.  His mild apnea is associated with oxygen desaturation and the  total desaturation time exceeded 30 minutes.  He was therefore asked to return for a CPAP titration which was performed on 17 May 2017 and was titrated to only 7 cm water pressure which reduced his apnea to 0.0.  His oxygen nadir increased to 98% saturation, and the patient slept 265 minutes with a large proportion of REM sleep.  I would consider the 7 cm setting ideal for this patient.  He also had some periodic limb movements but they did not lead to significant sleep arousals.  Overall we are meeting today to establish compliance. [name] reports that he developed head and neck pain, that kept him from using CPAP.  I have a Wi-Fi download available that states that he has not used CPAP until 13 December this year, he also did not use it on the 18th, today is a 44 and the current download would only acknowledge a compliance of less than 40%.  When using the CPAP machine he used it for 6 hours and 42 minutes, the set pressure was 8 cmH2O with 1 cm EPR, his residual AHI was 1.5. I do not want to make adjustments to the CPAP settings but I need [name] to use the machine more compliantly.  His physicians have assumed that his neck pain is a an arthritis related issue, it should not be aggravated by  using CPAP. I explained to him that compliance is more than 4 hours nightly, and that means 4 out of 5 nights for hours or more. He is unable to grasp the principle- he stated " I used it more than a week this month and every day " -but only 13 out of 30 days.  REVIEW OF SYSTEMS: Out of a complete 14 system review of symptoms, the patient complains only of the following symptoms, and all other reviewed systems are negative.  Epworth sleepiness score 12, fatigue severity score 38  ALLERGIES: Allergies  Allergen Reactions  . Ace Inhibitors Swelling and Other (See Comments)    Angioedema - face.     HOME MEDICATIONS: Outpatient Medications Prior to Visit  Medication Sig Dispense Refill    . ACCU-CHEK SOFTCLIX LANCETS lancets Once per day testing. 100 each 1  . alfuzosin (UROXATRAL) 10 MG 24 hr tablet Take 1 tablet (10 mg total) by mouth at bedtime. 30 tablet 0  . amphetamine-dextroamphetamine (ADDERALL XR) 30 MG 24 hr capsule Take 1 capsule (30 mg total) by mouth every morning. 90 capsule 0  . blood glucose meter kit and supplies Dispense based on patient and insurance preference. Use up to four times daily as directed. (FOR ICD-9 250.00, 250.01). 1 each 0  . donepezil (ARICEPT) 10 MG tablet Take 1 tablet (10 mg total) by mouth at bedtime. (Patient taking differently: Take 5 mg by mouth at bedtime. ) 30 tablet 5  . ferrous sulfate (KP FERROUS SULFATE) 325 (65 FE) MG tablet Take 325 mg by mouth 3 (three) times daily with meals.     [name] Kitchen glipiZIDE (GLUCOTROL) 5 MG tablet TAKE 1 TABLET BY MOUTH  TWICE A DAY BEFORE MEALS (Patient taking differently: TAKE 5 MG BY MOUTH  TWICE A DAY BEFORE MEALS) 60 tablet 0  . glucose blood (ACCU-CHEK AVIVA PLUS) test strip 1 each by Other route as needed for other. Use as instructed - once per day testing for now. 100 each 1  . losartan (COZAAR) 25 MG tablet Take 1 tablet (25 mg total) by mouth daily. 90 tablet 1  . metFORMIN (GLUCOPHAGE) 1000 MG tablet Take 1 tablet (1,000 mg total) by mouth 2 (two) times daily with a meal. 180 tablet 1  . metoprolol succinate (TOPROL-XL) 50 MG 24 hr tablet Take 1 tablet (50 mg total) by mouth daily. Take with or immediately following a meal. 90 tablet 1  . pravastatin (PRAVACHOL) 20 MG tablet Take 1 tablet (20 mg total) by mouth at bedtime. 90 tablet 1  . spironolactone (ALDACTONE) 25 MG tablet Take 1 tablet (25 mg total) by mouth 2 (two) times daily. 180 tablet 1  . aspirin EC 81 MG tablet Take 81 mg by mouth daily.     No facility-administered medications prior to visit.     PAST MEDICAL HISTORY: Past Medical History:  Diagnosis Date  . Cancer [facility])    hepatocellular cancer   . Diabetes mellitus without  complication ([place])   . Hepatitis   . Hyperlipidemia   . Hypertension   . Narcolepsy    per office visit note of 08/2011   . Sleep apnea     PAST SURGICAL HISTORY: Past Surgical History:  Procedure Laterality Date  . COLONOSCOPY WITH PROPOFOL N/A 08/27/2017   Procedure: COLONOSCOPY WITH PROPOFOL;  Surgeon: Clarene Essex, MD;  Location: [facility] ENDOSCOPY;  Service: Endoscopy;  Laterality: N/A;  . HOT HEMOSTASIS N/A 08/27/2017   Procedure: HOT HEMOSTASIS (ARGON PLASMA COAGULATION/BICAP);  Surgeon:  Clarene Essex, MD;  Location: [facility];  Service: Endoscopy;  Laterality: N/A;  . LAPAROSCOPY  03/02/2013   Procedure: LAPAROSCOPY DIAGNOSTIC;  Surgeon: [name], MD;  Location: [facility] ORS;  Service: General;;  . LIVER ULTRASOUND  03/02/2013   Procedure: LIVER ULTRASOUND;  Surgeon: [name], MD;  Location: [facility] ORS;  Service: General;;  . OPEN PARTIAL HEPATECTOMY   03/02/2013   Procedure: OPEN PARTIAL HEPATECTOMY [83];  Surgeon: [name], MD;  Location: [facility] ORS;  Service: General;;  DX LAPAROSCOPY, INTRAOPERATIVE LIVER ULTRASOUND, OPEN PARTIAL HEPATECTOMY  . pinched nerve in back      FAMILY HISTORY: Family History  Problem Relation Age of Onset  . Dementia Mother   . Cancer Father   . Diabetes Brother   . Hypertension Brother   . Cancer Brother     SOCIAL HISTORY: Social History   Socioeconomic History  . Marital status: Single    Spouse name: Not on file  . Number of children: 2  . Years of education: 15+  . Highest education level: Not on file  Social Needs  . Financial resource strain: Not on file  . Food insecurity - worry: Not on file  . Food insecurity - inability: Not on file  . Transportation needs - medical: Not on file  . Transportation needs - non-medical: Not on file  Occupational History  . Not on file  Tobacco Use  . Smoking status: Former Smoker    Years: 1.00  . Smokeless tobacco: Never Used  Substance and Sexual Activity  . Alcohol use: No  . Drug use: No  .  Sexual activity: Yes  Other Topics Concern  . Not on file  Social History Narrative   Patient is single and lives alone- became widower when 1 child was 57 year old, divorced 2nd marriage   Has #2 grown children, not in area or involved in his life   Patient is retired.   Patient has a college education.   Patient is right-handed.   Patient drinks maybe two sodas daily.      PHYSICAL EXAM  Vitals:   10/30/17 1252  BP: 126/68  Pulse: 68  Weight: 212 lb (96.2 kg)   Body mass index is 31.31 kg/m.   MMSE - Mini Mental State Exam 10/30/2017 04/09/2017  Orientation to time 4 3  Orientation to Place 4 5  Registration 3 2  Attention/ Calculation 3 2  Recall 2 1  Language- name 2 objects 2 2  Language- repeat 1 1  Language- follow 3 step command 1 3  Language- read & follow direction 1 1  Write a sentence 1 1  Copy design 1 0  Total score 23 21     Generalized: Well developed, in no acute distress   Neurological examination  Mentation: Alert oriented to time, place, history taking. Follows all commands speech and language fluent Cranial nerve II-XII: Pupils were equal round reactive to light. Extraocular movements were full, visual field were full on confrontational test. Facial sensation and strength were normal. Uvula tongue midline. Head turning and shoulder shrug  were normal and symmetric.  Neck circumference 17 inches, Mallampati 4. Motor: The motor testing reveals 5 over 5 strength of all 4 extremities. Good symmetric motor tone is noted throughout.  Sensory: Sensory testing is intact to soft touch on all 4 extremities. No evidence of extinction is noted.  Coordination: Cerebellar testing reveals good finger-nose-finger and heel-to-shin bilaterally.  Gait and station: Gait is normal.  Reflexes:  Deep tendon reflexes are symmetric and normal bilaterally.   DIAGNOSTIC DATA (LABS, IMAGING, TESTING) - I reviewed patient records, labs, notes, testing and imaging myself where  available.  Lab Results  Component Value Date   WBC 2.9 (L) 10/15/2017   HGB 10.6 (L) 09/16/2017   HCT 31.0 (L) 10/15/2017   MCV 99.4 (H) 10/15/2017   PLT 95 (L) 10/15/2017      Component Value Date/Time   NA 140 10/15/2017 0849   NA 144 08/05/2017 1755   NA 138 09/03/2016 0748   K 4.3 10/15/2017 0849   K 4.4 09/03/2016 0748   CL 108 10/15/2017 0849   CO2 23 10/15/2017 0849   CO2 19 (L) 09/03/2016 0748   GLUCOSE 198 (H) 10/15/2017 0849   GLUCOSE 174 (H) 09/03/2016 0748   BUN 12 10/15/2017 0849   BUN 8 08/05/2017 1755   BUN 13.2 09/03/2016 0748   CREATININE 1.25 10/15/2017 0849   CREATININE 1.1 09/03/2016 0748   CALCIUM 9.4 10/15/2017 0849   CALCIUM 9.4 09/03/2016 0748   PROT 7.4 10/15/2017 0849   PROT 7.7 01/05/2017 1435   PROT 7.8 03/03/2014 0754   ALBUMIN 3.4 (L) 10/15/2017 0849   ALBUMIN 4.0 01/05/2017 1435   ALBUMIN 3.7 03/03/2014 0754   AST 31 10/15/2017 0849   AST 36 (H) 03/03/2014 0754   ALT 31 10/15/2017 0849   ALT 32 03/03/2014 0754   ALKPHOS 90 10/15/2017 0849   ALKPHOS 77 03/03/2014 0754   BILITOT 1.0 10/15/2017 0849   BILITOT 0.75 03/03/2014 0754   GFRNONAA 55 (L) 10/15/2017 0849   GFRNONAA 82 12/14/2015 1337   GFRAA >60 10/15/2017 0849   GFRAA >89 12/14/2015 1337   Lab Results  Component Value Date   CHOL 152 01/05/2017   HDL 54 01/05/2017   LDLCALC 79 01/05/2017   TRIG 93 01/05/2017   CHOLHDL 2.8 01/05/2017   Lab Results  Component Value Date   HGBA1C 5.4 08/05/2017   Lab Results  Component Value Date   VITAMINB12 266 05/20/2017   Lab Results  Component Value Date   TSH 1.360 05/20/2017      ASSESSMENT AND PLAN 72 y.o. year old male  has a past medical history of Cancer ([place]), Diabetes mellitus without complication ([place]), Hepatitis, Hyperlipidemia, Hypertension, Narcolepsy, and Sleep apnea. here with 1.:  1.  Obstructive sleep apnea on CPAP 2.  Narcolepsy  The patient is encouraged to restart CPAP use.  He is encouraged to use  the machine nightly and greater than 4 hours each night.  A new prescription of Adderall was given to the patient for narcolepsy.  He will continue on Aricept for his memory.  His memory score is stable.  We will continue to monitor.  He is advised that if his symptoms worsen or he develops new symptoms he should let us know.  He will follow-up in 6 months or sooner if needed.   I spent 15 minutes with the patient. 50% of this time was spent reviewing his CPAP download   Ward Givens, MSN, NP-C 10/30/2017, 1:25 PM [facility] [address] [place], [place] [address] [phone]

## 2017-10-30 NOTE — Patient Instructions (Signed)
Your Plan:  Continue using CPAP nightly Continue Aricept for memory- score is stable Continue Adderall when you can afford it.   Thank you for coming to see [place] at [facility]. I hope we have been able to provide you high quality care today.  You may receive a patient satisfaction survey over the next few weeks. We would appreciate your feedback and comments so that we may continue to improve ourselves and the health of our patients.

## 2017-10-31 ENCOUNTER — Encounter: Payer: Medicare Other | Admitting: Family Medicine

## 2017-11-01 ENCOUNTER — Ambulatory Visit: Payer: Medicare Other | Admitting: Nurse Practitioner

## 2017-11-04 ENCOUNTER — Telehealth: Payer: Self-pay | Admitting: Neurology

## 2017-11-05 ENCOUNTER — Other Ambulatory Visit: Payer: Self-pay | Admitting: Neurology

## 2017-11-05 MED ORDER — DONEPEZIL HCL 10 MG PO TABS: 10.0000 mg | ORAL_TABLET | Freq: Every day | ORAL | 5 refills | Status: DC

## 2017-11-07 ENCOUNTER — Other Ambulatory Visit: Payer: Self-pay

## 2017-11-07 ENCOUNTER — Encounter: Payer: Self-pay | Admitting: Family Medicine

## 2017-11-07 ENCOUNTER — Ambulatory Visit (INDEPENDENT_AMBULATORY_CARE_PROVIDER_SITE_OTHER): Payer: Medicare Other | Admitting: Family Medicine

## 2017-11-07 VITALS — BP 130/60 | HR 72 | Temp 98.0°F | Ht 69.0 in | Wt 211.4 lb

## 2017-11-07 DIAGNOSIS — I10 Essential (primary) hypertension: Secondary | ICD-10-CM | POA: Diagnosis not present

## 2017-11-07 DIAGNOSIS — E119 Type 2 diabetes mellitus without complications: Secondary | ICD-10-CM

## 2017-11-07 DIAGNOSIS — K5900 Constipation, unspecified: Secondary | ICD-10-CM | POA: Diagnosis not present

## 2017-11-07 DIAGNOSIS — E785 Hyperlipidemia, unspecified: Secondary | ICD-10-CM

## 2017-11-07 DIAGNOSIS — N400 Enlarged prostate without lower urinary tract symptoms: Secondary | ICD-10-CM

## 2017-11-07 MED ORDER — METOPROLOL SUCCINATE ER 50 MG PO TB24: 50.0000 mg | ORAL_TABLET | Freq: Every day | ORAL | 1 refills | Status: DC

## 2017-11-07 MED ORDER — LOSARTAN POTASSIUM 25 MG PO TABS: 25.0000 mg | ORAL_TABLET | Freq: Every day | ORAL | 1 refills | Status: DC

## 2017-11-07 MED ORDER — METFORMIN HCL 1000 MG PO TABS: 1000.0000 mg | ORAL_TABLET | Freq: Two times a day (BID) | ORAL | 1 refills | Status: DC

## 2017-11-07 MED ORDER — SPIRONOLACTONE 25 MG PO TABS: 25.0000 mg | ORAL_TABLET | Freq: Two times a day (BID) | ORAL | 1 refills | Status: DC

## 2017-11-07 MED ORDER — ALFUZOSIN HCL ER 10 MG PO TB24: 10.0000 mg | ORAL_TABLET | Freq: Every day | ORAL | 1 refills | Status: DC

## 2017-11-07 MED ORDER — PRAVASTATIN SODIUM 20 MG PO TABS: 20.0000 mg | ORAL_TABLET | Freq: Every day | ORAL | 1 refills | Status: DC

## 2017-11-07 MED ORDER — GLIPIZIDE 5 MG PO TABS: ORAL_TABLET | ORAL | 1 refills | Status: DC

## 2017-11-07 NOTE — Patient Instructions (Addendum)
No change in medications for now.  I will check some blood work today.  Recheck 3 months for diabetes and reviewing other medications.  Increase water in the diet, fiber in the diet to see if that helps with constipation.  Additionally look at information I have provided below on the handout.  If your constipation is not improving in the next 1-2 weeks or any worsening symptoms please return to discuss further.   Return to the clinic or go to the nearest emergency room if any of your symptoms worsen or new symptoms occur.  Thank you for coming in today.    Constipation, Adult Constipation is when a person has fewer bowel movements in a week than normal, has difficulty having a bowel movement, or has stools that are dry, hard, or larger than normal. Constipation may be caused by an underlying condition. It may become worse with age if a person takes certain medicines and does not take in enough fluids. Follow these instructions at home: Eating and drinking   Eat foods that have a lot of fiber, such as fresh fruits and vegetables, whole grains, and beans.  Limit foods that are high in fat, low in fiber, or overly processed, such as french fries, hamburgers, cookies, candies, and soda.  Drink enough fluid to keep your urine clear or pale yellow. General instructions  Exercise regularly or as told by your health care provider.  Go to the restroom when you have the urge to go. Do not hold it in.  Take over-the-counter and prescription medicines only as told by your health care provider. These include any fiber supplements.  Practice pelvic floor retraining exercises, such as deep breathing while relaxing the lower abdomen and pelvic floor relaxation during bowel movements.  Watch your condition for any changes.  Keep all follow-up visits as told by your health care provider. This is important. Contact a health care provider if:  You have pain that gets worse.  You have a fever.  You  do not have a bowel movement after 4 days.  You vomit.  You are not hungry.  You lose weight.  You are bleeding from the anus.  You have thin, pencil-like stools. Get help right away if:  You have a fever and your symptoms suddenly get worse.  You leak stool or have blood in your stool.  Your abdomen is bloated.  You have severe pain in your abdomen.  You feel dizzy or you faint. This information is not intended to replace advice given to you by your health care provider. Make sure you discuss any questions you have with your health care provider. Document Released: 05/04/2004 Document Revised: 02/24/2016 Document Reviewed: 01/25/2016 Elsevier Interactive Patient Education  2018 [employer].     IF you received an x-ray today, you will receive an invoice from [facility]. Please contact [facility] at [phone] with questions or concerns regarding your invoice.   IF you received labwork today, you will receive an invoice from [facility]. Please contact [facility] at [phone] with questions or concerns regarding your invoice.   Our billing staff will not be able to assist you with questions regarding bills from these companies.  You will be contacted with the lab results as soon as they are available. The fastest way to get your results is to activate your My Chart account. Instructions are located on the last page of this paperwork. If you have not heard from [place] regarding the results in 2 weeks, please contact  this office.

## 2017-11-07 NOTE — Progress Notes (Signed)
Subjective:  By signing my name below, I, [name], attest that this documentation has been prepared under the direction and in the presence of [name], MD. Electronically Signed: [name], [place]. 11/07/2017 , 3:59 PM .  Patient was seen in Room 11 .   Patient ID: [name], male    DOB: Sep 30, 1945, 72 y.o.   MRN: [id number] Chief Complaint  Patient presents with  . Chornic Conditions    3 month f/u    HPI [name] is a 72 y.o. male Here for follow up. His last meal was around 9:00AM this morning.   Nacrolepsy with OSA  He is followed by sleep specialist, Dr. Brett Fairy. He takes Adderall. He was non-compliant with his CPAP. He had repeat sleep test ordered at last visit in Nov. He is on Arcept for short term memory loss. MMSE 21/30. Plan for 3 month follow up with [facility] or MMSE.   He states he saw Dr. Brett Fairy earlier this week, and is back on CPAP. Overall, doing alright.   Diabetes Lab Results  Component Value Date   HGBA1C 5.4 08/05/2017   He was continued on same dose of metformin 1055m BID and glipizide 565mBID. Pneumovax most recently 2016, foot exam 05/20/17 and optho 01/10/17. Urine microalbumin was < 3 in Sept 2018.   Hyperlipidemia Lab Results  Component Value Date   CHOL 152 01/05/2017   HDL 54 01/05/2017   LDLCALC 79 01/05/2017   TRIG 93 01/05/2017   CHOLHDL 2.8 01/05/2017   He takes pravastatin 2028mD.   HTN Lab Results  Component Value Date   CREATININE 1.25 10/15/2017   He takes metoprolol, Losartan, and aldactone.    BPH He takes Uroxatral 15m35mS.   History of hepatocellular cancer In 2014. Clinic remission, pancytopenia, secondary to cirrhosis. Continued on iron, and discontinued aspirin. Plan for CBC and office visit in 6 months. Normal AFP 3.0.   Patient Active Problem List   Diagnosis Date Noted  . Noncompliance with CPAP treatment 08/14/2017  . Excessive daytime sleepiness 04/09/2017  . Snoring  04/09/2017  . Memory impairment of gradual onset 04/09/2017  . Aortic atherosclerosis (HCC)[place]/05/2017  . 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 Altered mental status 03/05/2013  . Respiratory failure, post-operative (HCC)[place]/14/2014  . Hepatitis B 02/24/2013  . Cancer, hepatocellular (HCC)[place]/24/2014  . Liver tumor-bleeding 01/15/2013  . Epidermal cyst 06/18/2012  . OSA (obstructive sleep apnea) 03/13/2012  . Abnormal leg movement 12/12/2011  . [name] pressure elevated 09/06/2011  . Diabetes mellitus (HCC)[place]/17/2013  . 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 BPH (benign prostatic hyperplasia) 09/06/2011  . Diverticula of colon 09/06/2011   Past Medical History:  Diagnosis Date  . [facility]   . Diabetes mellitus without complication (HCC)[place]. Hepatitis   . Hyperlipidemia   . Hypertension   . Narcolepsy    per office visit note of 08/2011   . Sleep apnea    Past Surgical History:  Procedure Laterality Date  . COLONOSCOPY WITH PROPOFOL N/A 08/27/2017   Procedure: COLONOSCOPY WITH PROPOFOL;  Surgeon: MagoClarene Essex;  Location: [facility] ENDOSCOPY;  Service: Endoscopy;  Laterality: N/A;  . HOT HEMOSTASIS N/A 08/27/2017   Procedure: HOT HEMOSTASIS (ARGON PLASMA COAGULATION/BICAP);  Surgeon: MagoClarene Essex;  [facility];  Service: Endoscopy;  Laterality: N/A;  . LAPAROSCOPY  03/02/2013   Procedure: LAPAROSCOPY DIAGNOSTIC;  Surgeon: FaerStark Klein;  Location:  [facility] ORS;  Service: General;;  . LIVER ULTRASOUND  03/02/2013   Procedure: LIVER ULTRASOUND;  Surgeon: [name], MD;  Location: [facility] ORS;  Service: General;;  . OPEN PARTIAL HEPATECTOMY   03/02/2013   Procedure: OPEN PARTIAL HEPATECTOMY [83];  Surgeon: [name], MD;  Location: [facility] ORS;  Service: General;;  DX LAPAROSCOPY, INTRAOPERATIVE LIVER ULTRASOUND, OPEN PARTIAL HEPATECTOMY  . pinched nerve in back     Allergies  Allergen Reactions  . Ace Inhibitors Swelling and Other (See  Comments)    Angioedema - face.    Prior to Admission medications   Medication Sig Start Date End Date Taking? Authorizing Provider  ACCU-CHEK SOFTCLIX LANCETS lancets Once per day testing. 08/05/17   Wendie Agreste, MD  alfuzosin (UROXATRAL) 10 MG 24 hr tablet Take 1 tablet (10 mg total) by mouth at bedtime. 08/05/17   Wendie Agreste, MD  amphetamine-dextroamphetamine (ADDERALL XR) 30 MG 24 hr capsule Take 1 capsule (30 mg total) by mouth every morning. 10/30/17   Ward Givens, NP  [name] glucose meter kit and supplies Dispense based on patient and insurance preference. Use up to four times daily as directed. (FOR ICD-9 250.00, 250.01). 04/25/17   Wendie Agreste, MD  donepezil (ARICEPT) 10 MG tablet Take 1 tablet (10 mg total) by mouth at bedtime. 11/05/17   Dohmeier, Asencion Partridge, MD  ferrous sulfate (KP FERROUS SULFATE) 325 (65 FE) MG tablet Take 325 mg by mouth 3 (three) times daily with meals.     [provider]  glipiZIDE (GLUCOTROL) 5 MG tablet TAKE 1 TABLET BY MOUTH  TWICE A DAY BEFORE MEALS Patient taking differently: TAKE 5 MG BY MOUTH  TWICE A DAY BEFORE MEALS 06/03/17   Weber, Sarah L, PA-C  glucose [name] (ACCU-CHEK AVIVA PLUS) test strip 1 each by Other route as needed for other. Use as instructed - once per day testing for now. 08/05/17   Wendie Agreste, MD  losartan (COZAAR) 25 MG tablet Take 1 tablet (25 mg total) by mouth daily. 08/05/17   Wendie Agreste, MD  metFORMIN (GLUCOPHAGE) 1000 MG tablet Take 1 tablet (1,000 mg total) by mouth 2 (two) times daily with a meal. 08/05/17   Wendie Agreste, MD  metoprolol succinate (TOPROL-XL) 50 MG 24 hr tablet Take 1 tablet (50 mg total) by mouth daily. Take with or immediately following a meal. 08/05/17   Wendie Agreste, MD  pravastatin (PRAVACHOL) 20 MG tablet Take 1 tablet (20 mg total) by mouth at bedtime. 08/05/17   Wendie Agreste, MD  spironolactone (ALDACTONE) 25 MG tablet Take 1 tablet (25 mg total) by mouth  2 (two) times daily. 08/05/17   Wendie Agreste, MD   Social History   Socioeconomic History  . Marital status: Single    Spouse name: Not on file  . Number of children: 2  . Years of education: 15+  . Highest education level: Not on file  Occupational History  . Not on file  Social Needs  . Financial resource strain: Not on file  . Food insecurity:    Worry: Not on file    Inability: Not on file  . Transportation needs:    Medical: Not on file    Non-medical: Not on file  Tobacco Use  . Smoking status: Former Smoker    Years: 1.00  . Smokeless tobacco: Never Used  Substance and Sexual Activity  . Alcohol use: No  . Drug use: No  . Sexual activity:  Yes  Lifestyle  . Physical activity:    Days per week: Not on file    Minutes per session: Not on file  . Stress: Not on file  Relationships  . Social connections:    Talks on phone: Not on file    Gets together: Not on file    Attends religious service: Not on file    Active member of club or organization: Not on file    Attends meetings of clubs or organizations: Not on file    Relationship status: Not on file  . Intimate partner violence:    Fear of current or ex partner: Not on file    Emotionally abused: Not on file    Physically abused: Not on file    Forced sexual activity: Not on file  Other Topics Concern  . Not on file  Social History Narrative   Patient is single and lives alone- became widower when 1 child was 12 year old, divorced 2nd marriage   Has #2 grown children, not in area or involved in his life   Patient is retired.   Patient has a college education.   Patient is right-handed.   Patient drinks maybe two sodas daily.   Review of Systems  Constitutional: Negative for fatigue and unexpected weight change.  Eyes: Negative for visual disturbance.  Respiratory: Negative for cough, chest tightness and shortness of breath.   Cardiovascular: Negative for chest pain, palpitations and leg swelling.    Gastrointestinal: Negative for abdominal pain and [name] in stool.  Neurological: Negative for dizziness, light-headedness and headaches.       Objective:   Physical Exam  Constitutional: He is oriented to person, place, and time. He appears well-developed and well-nourished.  HENT:  Head: Normocephalic and atraumatic.  Eyes: Pupils are equal, round, and reactive to light. EOM are normal.  Neck: No JVD present. Carotid bruit is not present.  Cardiovascular: Normal rate, regular rhythm and normal heart sounds.  No murmur heard. Pulmonary/Chest: Effort normal and breath sounds normal. He has no rales.  Musculoskeletal: He exhibits no edema.  Neurological: He is alert and oriented to person, place, and time.  Skin: Skin is warm and dry.  Psychiatric: He has a normal mood and affect.  Vitals reviewed.   Vitals:   11/07/17 1504  BP: 130/60  Pulse: 72  Temp: 98 F (36.7 C)  TempSrc: Oral  Weight: 211 lb 6.4 oz (95.9 kg)  Height: _0  (1.753 m)       Assessment & Plan:    ALBERT HERSCH is a 72 y.o. male Benign prostatic hyperplasia, unspecified whether lower urinary tract symptoms present  -Stable, continue Uroxatrol.  Essential hypertension - Plan: Comprehensive metabolic panel, spironolactone (ALDACTONE) 25 MG tablet, metoprolol succinate (TOPROL-XL) 50 MG 24 hr tablet, losartan (COZAAR) 25 MG tablet  -Stable control, continue same dose of medications as he is currently tolerating  Hyperlipidemia, unspecified hyperlipidemia type - Plan: Comprehensive metabolic panel, Lipid panel, pravastatin (PRAVACHOL) 20 MG tablet  -Tolerating pravastatin, check labs, no change in dosing for now.  Type 2 diabetes mellitus without complication, without long-term current use of insulin (HCC) - Plan: Hemoglobin A1c, metFORMIN (GLUCOPHAGE) 1000 MG tablet  -Check A1c, tolerating current dose of metformin and glipizide without any side effects.  No changes at this time.    Constipation, unspecified constipation type  -Increase water diet, fiber, over-the-counter treatment options discussed and handout given.  RTC precautions if not improving  Meds ordered this encounter  Medications  . spironolactone (ALDACTONE) 25 MG tablet    Sig: Take 1 tablet (25 mg total) by mouth 2 (two) times daily.    Dispense:  180 tablet    Refill:  1  . pravastatin (PRAVACHOL) 20 MG tablet    Sig: Take 1 tablet (20 mg total) by mouth at bedtime.    Dispense:  90 tablet    Refill:  1  . metoprolol succinate (TOPROL-XL) 50 MG 24 hr tablet    Sig: Take 1 tablet (50 mg total) by mouth daily. Take with or immediately following a meal.    Dispense:  90 tablet    Refill:  1  . metFORMIN (GLUCOPHAGE) 1000 MG tablet    Sig: Take 1 tablet (1,000 mg total) by mouth 2 (two) times daily with a meal.    Dispense:  180 tablet    Refill:  1  . losartan (COZAAR) 25 MG tablet    Sig: Take 1 tablet (25 mg total) by mouth daily.    Dispense:  90 tablet    Refill:  1  . glipiZIDE (GLUCOTROL) 5 MG tablet    Sig: TAKE 1 TABLET BY MOUTH&amp;nbsp;&amp;nbsp;TWICE A DAY BEFORE MEALS    Dispense:  180 tablet    Refill:  1  . alfuzosin (UROXATRAL) 10 MG 24 hr tablet    Sig: Take 1 tablet (10 mg total) by mouth at bedtime.    Dispense:  90 tablet    Refill:  1   Patient Instructions   No change in medications for now.  I will check some [name] work today.  Recheck 3 months for diabetes and reviewing other medications.  Increase water in the diet, fiber in the diet to see if that helps with constipation.  Additionally look at information I have provided below on the handout.  If your constipation is not improving in the next 1-2 weeks or any worsening symptoms please return to discuss further.   Return to the clinic or go to the nearest emergency room if any of your symptoms worsen or new symptoms occur.  Thank you for coming in today.    Constipation, Adult Constipation is when a person  has fewer bowel movements in a week than normal, has difficulty having a bowel movement, or has stools that are dry, hard, or larger than normal. Constipation may be caused by an underlying condition. It may become worse with age if a person takes certain medicines and does not take in enough fluids. Follow these instructions at home: Eating and drinking   Eat foods that have a lot of fiber, such as fresh fruits and vegetables, whole grains, and beans.  Limit foods that are high in fat, low in fiber, or overly processed, such as french fries, hamburgers, cookies, candies, and soda.  Drink enough fluid to keep your urine clear or pale yellow. General instructions  Exercise regularly or as told by your health care provider.  Go to the restroom when you have the urge to go. Do not hold it in.  Take over-the-counter and prescription medicines only as told by your health care provider. These include any fiber supplements.  Practice pelvic floor retraining exercises, such as deep breathing while relaxing the lower abdomen and pelvic floor relaxation during bowel movements.  Watch your condition for any changes.  Keep all follow-up visits as told by your health care provider. This is important. Contact a health care provider if:  You have pain that gets worse.  You  have a fever.  You do not have a bowel movement after 4 days.  You vomit.  You are not hungry.  You lose weight.  You are bleeding from the anus.  You have thin, pencil-like stools. Get help right away if:  You have a fever and your symptoms suddenly get worse.  You leak stool or have [name] in your stool.  Your abdomen is bloated.  You have severe pain in your abdomen.  You feel dizzy or you faint. This information is not intended to replace advice given to you by your health care provider. Make sure you discuss any questions you have with your health care provider. Document Released: 05/04/2004 Document  Revised: 02/24/2016 Document Reviewed: 01/25/2016 Elsevier Interactive Patient Education  2018 [employer].     IF you received an x-ray today, you will receive an invoice from [facility]. Please contact [facility] at [phone] with questions or concerns regarding your invoice.   IF you received labwork today, you will receive an invoice from [facility]. Please contact [facility] at [phone] with questions or concerns regarding your invoice.   Our billing staff will not be able to assist you with questions regarding bills from these companies.  You will be contacted with the lab results as soon as they are available. The fastest way to get your results is to activate your My Chart account. Instructions are located on the last page of this paperwork. If you have not heard from [place] regarding the results in 2 weeks, please contact this office.       I personally performed the services described in this documentation, which was scribed in my presence. The recorded information has been reviewed and considered for accuracy and completeness, addended by me as needed, and agree with information above.  Signed,   [name], MD [facility] [place].  11/09/17 11:45 AM

## 2017-11-08 LAB — LIPID PANEL
Chol/HDL Ratio: 2.9 ratio (ref 0.0–5.0)
Cholesterol, Total: 152 mg/dL (ref 100–199)
HDL: 53 mg/dL (ref >39)
LDL Calculated: 81 mg/dL (ref 0–99)
Triglycerides: 91 mg/dL (ref 0–149)
VLDL Cholesterol Cal: 18 mg/dL (ref 5–40)

## 2017-11-08 LAB — COMPREHENSIVE METABOLIC PANEL
ALT: 20 IU/L (ref 0–44)
AST: 26 IU/L (ref 0–40)
Albumin/Globulin Ratio: 1.1 — ABNORMAL LOW (ref 1.2–2.2)
Albumin: 4 g/dL (ref 3.5–4.8)
Alkaline Phosphatase: 97 IU/L (ref 39–117)
BUN/Creatinine Ratio: 8 — ABNORMAL LOW (ref 10–24)
BUN: 8 mg/dL (ref 8–27)
Bilirubin Total: 1.2 mg/dL (ref 0.0–1.2)
CO2: 21 mmol/L (ref 20–29)
Calcium: 9.1 mg/dL (ref 8.6–10.2)
Chloride: 107 mmol/L — ABNORMAL HIGH (ref 96–106)
Creatinine, Ser: 1 mg/dL (ref 0.76–1.27)
GFR calc Af Amer: 86 mL/min/{1.73_m2} (ref >59)
GFR calc non Af Amer: 74 mL/min/{1.73_m2} (ref >59)
Globulin, Total: 3.5 g/dL (ref 1.5–4.5)
Glucose: 91 mg/dL (ref 65–99)
Potassium: 3.8 mmol/L (ref 3.5–5.2)
Sodium: 143 mmol/L (ref 134–144)
Total Protein: 7.5 g/dL (ref 6.0–8.5)

## 2017-11-08 LAB — HEMOGLOBIN A1C
Est. average glucose Bld gHb Est-mCnc: 180 mg/dL
Hgb A1c MFr Bld: 7.9 % — ABNORMAL HIGH (ref 4.8–5.6)

## 2017-11-09 ENCOUNTER — Encounter: Payer: Self-pay | Admitting: Family Medicine

## 2017-11-09 DIAGNOSIS — G4733 Obstructive sleep apnea (adult) (pediatric): Secondary | ICD-10-CM | POA: Diagnosis not present

## 2017-11-12 ENCOUNTER — Other Ambulatory Visit: Payer: Self-pay | Admitting: *Deleted

## 2017-11-12 NOTE — Patient Outreach (Addendum)
[facility]) Care Management  11/12/2017  [name] 30-Jan-1946 [id number]   [name] Monthly Outreach  Referral Date:05/23/2017 Referral Source:Primary MD Reason for Referral:Medication Assistance Insurance:[employer]   Outreach Attempt:  Outreach attempt #1 to patient for monthly follow up. No answer. RN Health Coach unable to leave HIPAA compliant voicemail message due to voicemail not being set up.  Plan:  RN Health Coach will make another outreach attempt to patient within one business day if no return call back from patient.  [place] [phone] [email or web address]

## 2017-11-13 ENCOUNTER — Other Ambulatory Visit: Payer: Self-pay | Admitting: *Deleted

## 2017-11-13 NOTE — Patient Outreach (Signed)
[facility]) Care Management  11/13/2017  [name] [birth year] [id number]   [place] Monthly Outreach  Referral Date:05/23/2017 Referral Source:Primary MD Reason for Referral:Medication Assistance Insurance:[employer] Medicare   Outreach Attempt:  Outreach attempt #2 to patient for monthly follow up. No answer and unable to leave voicemail message due to voicemail not being set up.  Plan:  RN Health Coach will send unsuccessful outreach letter to patient.  RN Health Coach will make final telephone outreach attempt to patient within the next 10 business days.  [place] Coach [phone] [email or web address]

## 2017-11-18 ENCOUNTER — Encounter: Payer: Self-pay | Admitting: Family Medicine

## 2017-11-18 ENCOUNTER — Other Ambulatory Visit: Payer: Self-pay

## 2017-11-18 ENCOUNTER — Ambulatory Visit (INDEPENDENT_AMBULATORY_CARE_PROVIDER_SITE_OTHER): Payer: Medicare Other | Admitting: Family Medicine

## 2017-11-18 VITALS — BP 130/56 | HR 70 | Temp 98.4°F | Ht 69.0 in | Wt 208.8 lb

## 2017-11-18 DIAGNOSIS — E119 Type 2 diabetes mellitus without complications: Secondary | ICD-10-CM

## 2017-11-18 DIAGNOSIS — K5900 Constipation, unspecified: Secondary | ICD-10-CM | POA: Diagnosis not present

## 2017-11-18 DIAGNOSIS — N401 Enlarged prostate with lower urinary tract symptoms: Secondary | ICD-10-CM | POA: Diagnosis not present

## 2017-11-18 DIAGNOSIS — R3912 Poor urinary stream: Secondary | ICD-10-CM | POA: Diagnosis not present

## 2017-11-18 NOTE — Progress Notes (Addendum)
Subjective:    Patient ID: [name], male    DOB: 1945/09/30, 73 y.o.   MRN: [id number]  HPI [name] is a 72 y.o. male Presents today for: Chief Complaint  Patient presents with  . Constipated    He has not been able to go regularly (last BM friday and very little), Pain in stomach and    Constipation: Seen 11/07/17.  Discussed constipation at that time including home treatment options, with handout given.  Last BM Friday (3 days ago), but small stool/amount.  Has not tried any stool softeners over the counter, or other treatments. Has been trying to eat more fiber and drinking water. More sore past few days in abdomen as well. Comes and goes.  Slight nausea, but no vomiting. No fever. No blood in stool. Has always had some issue with harder stools at times.   He was seen by his urologist this morning who performed a digital rectal exam for prostate testing.  He did advise his urologist about constipation symptoms, and did not note any fecal impaction.  Defers rectal exam today.  Diabetes: See last labs. A1c had increased from 5.4 to 7.9.  He feels like he has missed meds at times, but has been using his pill box. Does not have readings available today.   Patient Active Problem List   Diagnosis Date Noted  . Noncompliance with CPAP treatment 08/14/2017  . Excessive daytime sleepiness 04/09/2017  . Snoring 04/09/2017  . Memory impairment of gradual onset 04/09/2017  . Aortic atherosclerosis ([place]) 11/27/2016  . 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 Altered mental status 03/05/2013  . Respiratory failure, post-operative ([place]) 03/02/2013  . Hepatitis B 02/24/2013  . Cancer, hepatocellular ([place]) 02/10/2013  . Liver tumor-bleeding 01/15/2013  . Epidermal cyst 06/18/2012  . OSA (obstructive sleep apnea) 03/13/2012  . Abnormal leg movement 12/12/2011  . Blood pressure elevated 09/06/2011  . Diabetes mellitus ([place]) 09/06/2011  .  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 BPH (benign prostatic hyperplasia) 09/06/2011  . Diverticula of colon 09/06/2011   Past Medical History:  Diagnosis Date  . Cancer [facility])    hepatocellular cancer   . Diabetes mellitus without complication ([place])   . Hepatitis   . Hyperlipidemia   . Hypertension   . Narcolepsy    per office visit note of 08/2011   . Sleep apnea    Past Surgical History:  Procedure Laterality Date  . COLONOSCOPY WITH PROPOFOL N/A 08/27/2017   Procedure: COLONOSCOPY WITH PROPOFOL;  Surgeon: Clarene Essex, MD;  Location: [facility] ENDOSCOPY;  Service: Endoscopy;  Laterality: N/A;  . HOT HEMOSTASIS N/A 08/27/2017   Procedure: HOT HEMOSTASIS (ARGON PLASMA COAGULATION/BICAP);  Surgeon: Clarene Essex, MD;  Location: [facility];  Service: Endoscopy;  Laterality: N/A;  . LAPAROSCOPY  03/02/2013   Procedure: LAPAROSCOPY DIAGNOSTIC;  Surgeon: [name], MD;  Location: [facility] ORS;  Service: General;;  . LIVER ULTRASOUND  03/02/2013   Procedure: LIVER ULTRASOUND;  Surgeon: [name], MD;  Location: [facility] ORS;  Service: General;;  . OPEN PARTIAL HEPATECTOMY   03/02/2013   Procedure: OPEN PARTIAL HEPATECTOMY [83];  Surgeon: [name], MD;  Location: [facility] ORS;  Service: General;;  DX LAPAROSCOPY, INTRAOPERATIVE LIVER ULTRASOUND, OPEN PARTIAL HEPATECTOMY  . pinched nerve in back     Allergies  Allergen Reactions  . Ace Inhibitors Swelling and Other (See Comments)    Angioedema - face.    Prior to Admission medications   Medication Sig Start Date  End Date Taking? Authorizing Provider  ACCU-CHEK SOFTCLIX LANCETS lancets Once per day testing. 08/05/17  Yes Wendie Agreste, MD  alfuzosin (UROXATRAL) 10 MG 24 hr tablet Take 1 tablet (10 mg total) by mouth at bedtime. 11/07/17  Yes Wendie Agreste, MD  amphetamine-dextroamphetamine (ADDERALL XR) 30 MG 24 hr capsule Take 1 capsule (30 mg total) by mouth every morning. 10/30/17  Yes Ward Givens, NP  blood glucose meter kit and supplies Dispense based on  patient and insurance preference. Use up to four times daily as directed. (FOR ICD-9 250.00, 250.01). 04/25/17  Yes Wendie Agreste, MD  donepezil (ARICEPT) 10 MG tablet Take 1 tablet (10 mg total) by mouth at bedtime. 11/05/17  Yes Dohmeier, Asencion Partridge, MD  ferrous sulfate (KP FERROUS SULFATE) 325 (65 FE) MG tablet Take 325 mg by mouth 3 (three) times daily with meals.    Yes [provider]  glipiZIDE (GLUCOTROL) 5 MG tablet TAKE 1 TABLET BY MOUTH&amp;nbsp;&amp;nbsp;TWICE A DAY BEFORE MEALS 11/07/17  Yes Wendie Agreste, MD  glucose blood (ACCU-CHEK AVIVA PLUS) test strip 1 each by Other route as needed for other. Use as instructed - once per day testing for now. 08/05/17  Yes Wendie Agreste, MD  losartan (COZAAR) 25 MG tablet Take 1 tablet (25 mg total) by mouth daily. 11/07/17  Yes Wendie Agreste, MD  metFORMIN (GLUCOPHAGE) 1000 MG tablet Take 1 tablet (1,000 mg total) by mouth 2 (two) times daily with a meal. 11/07/17  Yes Wendie Agreste, MD  metoprolol succinate (TOPROL-XL) 50 MG 24 hr tablet Take 1 tablet (50 mg total) by mouth daily. Take with or immediately following a meal. 11/07/17  Yes Wendie Agreste, MD  pravastatin (PRAVACHOL) 20 MG tablet Take 1 tablet (20 mg total) by mouth at bedtime. 11/07/17  Yes Wendie Agreste, MD  spironolactone (ALDACTONE) 25 MG tablet Take 1 tablet (25 mg total) by mouth 2 (two) times daily. 11/07/17  Yes Wendie Agreste, MD   Social History   Socioeconomic History  . Marital status: Single    Spouse name: Not on file  . Number of children: 2  . Years of education: 15+  . Highest education level: Not on file  Occupational History  . Not on file  Social Needs  . Financial resource strain: Not on file  . Food insecurity:    Worry: Not on file    Inability: Not on file  . Transportation needs:    Medical: Not on file    Non-medical: Not on file  Tobacco Use  . Smoking status: Former Smoker    Years: 1.00  . Smokeless tobacco: Never  Used  Substance and Sexual Activity  . Alcohol use: No  . Drug use: No  . Sexual activity: Yes  Lifestyle  . Physical activity:    Days per week: Not on file    Minutes per session: Not on file  . Stress: Not on file  Relationships  . Social connections:    Talks on phone: Not on file    Gets together: Not on file    Attends religious service: Not on file    Active member of club or organization: Not on file    Attends meetings of clubs or organizations: Not on file    Relationship status: Not on file  . Intimate partner violence:    Fear of current or ex partner: Not on file    Emotionally abused: Not on file  Physically abused: Not on file    Forced sexual activity: Not on file  Other Topics Concern  . Not on file  Social History Narrative   Patient is single and lives alone- became widower when 1 child was 31 year old, divorced 2nd marriage   Has #2 grown children, not in area or involved in his life   Patient is retired.   Patient has a college education.   Patient is right-handed.   Patient drinks maybe two sodas daily.    Review of Systems  Constitutional: Negative for chills and fever.  Gastrointestinal: Positive for abdominal pain (episodic. ), constipation and nausea (few episodes. ). Negative for anal bleeding, blood in stool and vomiting.       Objective:   Physical Exam  Constitutional: He is oriented to person, place, and time. He appears well-developed and well-nourished.  HENT:  Head: Normocephalic and atraumatic.  Eyes: Pupils are equal, round, and reactive to light. EOM are normal.  Neck: Carotid bruit is not present.  Cardiovascular: Normal rate, regular rhythm and normal heart sounds.  No murmur heard. Pulmonary/Chest: Effort normal and breath sounds normal. He has no rales.  Abdominal: Soft. Bowel sounds are normal. He exhibits no distension. There is no tenderness. There is no rebound and no guarding.  Musculoskeletal: He exhibits no edema.    Neurological: He is alert and oriented to person, place, and time.  Skin: Skin is warm and dry.  Psychiatric: He has a normal mood and affect. His behavior is normal.  Vitals reviewed.  Vitals:   11/18/17 1042  BP: (!) 130/56  Pulse: 70  Temp: 98.4 F (36.9 C)  TempSrc: Oral  SpO2: 98%  Weight: 208 lb 12.8 oz (94.7 kg)  Height: _0  (1.753 m)       Assessment & Plan:   [name] is a 72 y.o. male Constipation, unspecified constipation type  -Long-standing episodic issue.  Has not taken any medication treatments for recurrent episode.  Planned on DRE, but had this performed earlier today without apparent impaction.  Exam was deferred at present.  Imaging deferred at present as nontender abdominal exam, reassuring vital signs/exam.  -Trial of MiraLAX twice per day until improved bowel movements, then decrease to once per day or stop altogether if he experiences diarrhea.  Would consider Colace daily to lessen chance of constipation in the future, as well as continuing to maintain adequate fluid intake and fiber in the diet.  -Handout given, return to clinic precautions discussed if not improving the next 72 hours, sooner if worse.  Type 2 diabetes mellitus without complication, without long-term current use of insulin (HCC)  -Significant change in A1c as above, suspect there may be some missed dose of medication.  Continue pill minder as well as potential alarms to be set for timing of medication or change location of medication to help as a visual reminder.   -Recheck with medications in 4 weeks as well as home readings at that time.  No orders of the defined types were placed in this encounter.  Patient Instructions   Miralax 1 cap in 8 ounces water,  twice per day for now  Once your bowel movements become softer, can decrease to once per day, or stop that medicine if it causes diarrhea. Change to colace over the counter once per day to maintain soft stools once your  current issue improves.  walking can help stimulate bowel movement.,  Recheck in next 3 days if not improving.  Return to the clinic or go to the nearest emergency room if any of your symptoms worsen or new symptoms occur.  Diabetes level was a little too high. Make sure you take your medicines as prescribed. Continue to use the pill box to keep track of your meds, but you may want to try either an alarm or other way to remember your medicine. You can also put your medicines in a more visible area if needed. I will continue same dose of meds, but bring your medicines into the office in the next 4 weeks to make sure you are on right medicines.  Bring copy of home readings to that visit.    Constipation, Adult Constipation is when a person:  Poops (has a bowel movement) fewer times in a week than normal.  Has a hard time pooping.  Has poop that is dry, hard, or bigger than normal.  Follow these instructions at home: Eating and drinking   Eat foods that have a lot of fiber, such as: ? Fresh fruits and vegetables. ? Whole grains. ? Beans.  Eat less of foods that are high in fat, low in fiber, or overly processed, such as: ? [place] fries. ? Hamburgers. ? Cookies. ? Candy. ? Soda.  Drink enough fluid to keep your pee (urine) clear or pale yellow. General instructions  Exercise regularly or as told by your doctor.  Go to the restroom when you feel like you need to poop. Do not hold it in.  Take over-the-counter and prescription medicines only as told by your doctor. These include any fiber supplements.  Do pelvic floor retraining exercises, such as: ? Doing deep breathing while relaxing your lower belly (abdomen). ? Relaxing your pelvic floor while pooping.  Watch your condition for any changes.  Keep all follow-up visits as told by your doctor. This is important. Contact a doctor if:  You have pain that gets worse.  You have a fever.  You have not pooped for 4  days.  You throw up (vomit).  You are not hungry.  You lose weight.  You are bleeding from the anus.  You have thin, pencil-like poop (stool). Get help right away if:  You have a fever, and your symptoms suddenly get worse.  You leak poop or have blood in your poop.  Your belly feels hard or bigger than normal (is bloated).  You have very bad belly pain.  You feel dizzy or you faint. This information is not intended to replace advice given to you by your health care provider. Make sure you discuss any questions you have with your health care provider. Document Released: 01/23/2008 Document Revised: 02/24/2016 Document Reviewed: 01/25/2016 Elsevier Interactive Patient Education  2018 [employer].    IF you received an x-ray today, you will receive an invoice from [facility]. Please contact [facility] Radiology at [phone] with questions or concerns regarding your invoice.   IF you received labwork today, you will receive an invoice from [place]. Please contact [facility] at [phone] with questions or concerns regarding your invoice.   Our billing staff will not be able to assist you with questions regarding bills from these companies.  You will be contacted with the lab results as soon as they are available. The fastest way to get your results is to activate your My Chart account. Instructions are located on the last page of this paperwork. If you have not heard from [place] regarding the results in 2 weeks, please contact this office.  Signed,   [name], MD [facility].  11/18/17 6:56 PM

## 2017-11-18 NOTE — Patient Instructions (Addendum)
Miralax 1 cap in 8 ounces water,  twice per day for now  Once your bowel movements become softer, can decrease to once per day, or stop that medicine if it causes diarrhea. Change to colace over the counter once per day to maintain soft stools once your current issue improves.  walking can help stimulate bowel movement.,  Recheck in next 3 days if not improving. Return to the clinic or go to the nearest emergency room if any of your symptoms worsen or new symptoms occur.  Diabetes level was a little too high. Make sure you take your medicines as prescribed. Continue to use the pill box to keep track of your meds, but you may want to try either an alarm or other way to remember your medicine. You can also put your medicines in a more visible area if needed. I will continue same dose of meds, but bring your medicines into the office in the next 4 weeks to make sure you are on right medicines.  Bring copy of home readings to that visit.    Constipation, Adult Constipation is when a person:  Poops (has a bowel movement) fewer times in a week than normal.  Has a hard time pooping.  Has poop that is dry, hard, or bigger than normal.  Follow these instructions at home: Eating and drinking   Eat foods that have a lot of fiber, such as: ? Fresh fruits and vegetables. ? Whole grains. ? Beans.  Eat less of foods that are high in fat, low in fiber, or overly processed, such as: ? [place] fries. ? Hamburgers. ? Cookies. ? Candy. ? Soda.  Drink enough fluid to keep your pee (urine) clear or pale yellow. General instructions  Exercise regularly or as told by your doctor.  Go to the restroom when you feel like you need to poop. Do not hold it in.  Take over-the-counter and prescription medicines only as told by your doctor. These include any fiber supplements.  Do pelvic floor retraining exercises, such as: ? Doing deep breathing while relaxing your lower belly (abdomen). ? Relaxing your  pelvic floor while pooping.  Watch your condition for any changes.  Keep all follow-up visits as told by your doctor. This is important. Contact a doctor if:  You have pain that gets worse.  You have a fever.  You have not pooped for 4 days.  You throw up (vomit).  You are not hungry.  You lose weight.  You are bleeding from the anus.  You have thin, pencil-like poop (stool). Get help right away if:  You have a fever, and your symptoms suddenly get worse.  You leak poop or have blood in your poop.  Your belly feels hard or bigger than normal (is bloated).  You have very bad belly pain.  You feel dizzy or you faint. This information is not intended to replace advice given to you by your health care provider. Make sure you discuss any questions you have with your health care provider. Document Released: 01/23/2008 Document Revised: 02/24/2016 Document Reviewed: 01/25/2016 Elsevier Interactive Patient Education  2018 [employer].    IF you received an x-ray today, you will receive an invoice from [facility]. Please contact [facility]iology at [phone] with questions or concerns regarding your invoice.   IF you received labwork today, you will receive an invoice from [place]. Please contact [facility] at [phone] with questions or concerns regarding your invoice.   Our billing staff will not be  able to assist you with questions regarding bills from these companies.  You will be contacted with the lab results as soon as they are available. The fastest way to get your results is to activate your My Chart account. Instructions are located on the last page of this paperwork. If you have not heard from [place] regarding the results in 2 weeks, please contact this office.

## 2017-11-21 ENCOUNTER — Other Ambulatory Visit: Payer: Self-pay

## 2017-11-21 ENCOUNTER — Ambulatory Visit (INDEPENDENT_AMBULATORY_CARE_PROVIDER_SITE_OTHER): Payer: Medicare Other | Admitting: Family Medicine

## 2017-11-21 ENCOUNTER — Encounter: Payer: Self-pay | Admitting: Family Medicine

## 2017-11-21 VITALS — BP 120/60 | HR 71 | Temp 97.6°F | Ht 69.0 in | Wt 209.8 lb

## 2017-11-21 DIAGNOSIS — E1165 Type 2 diabetes mellitus with hyperglycemia: Secondary | ICD-10-CM

## 2017-11-21 DIAGNOSIS — K5900 Constipation, unspecified: Secondary | ICD-10-CM

## 2017-11-21 NOTE — Patient Instructions (Addendum)
Ok to decrease miralax to once per day as long as you are having bowel movements daily.  If any diarrhea, stop the MIralax. Continue to drink water and obtain fiber in diet (whole grains, vegetables).   Follow-up in the next 4 to 6 weeks with your medications and home blood sugar readings.  Make sure you continue to use the pill reminder box, alarms, or change in location of that medication so that you can minimize any missed doses.  Constipation, Adult Constipation is when a person has fewer bowel movements in a week than normal, has difficulty having a bowel movement, or has stools that are dry, hard, or larger than normal. Constipation may be caused by an underlying condition. It may become worse with age if a person takes certain medicines and does not take in enough fluids. Follow these instructions at home: Eating and drinking   Eat foods that have a lot of fiber, such as fresh fruits and vegetables, whole grains, and beans.  Limit foods that are high in fat, low in fiber, or overly processed, such as french fries, hamburgers, cookies, candies, and soda.  Drink enough fluid to keep your urine clear or pale yellow. General instructions  Exercise regularly or as told by your health care provider.  Go to the restroom when you have the urge to go. Do not hold it in.  Take over-the-counter and prescription medicines only as told by your health care provider. These include any fiber supplements.  Practice pelvic floor retraining exercises, such as deep breathing while relaxing the lower abdomen and pelvic floor relaxation during bowel movements.  Watch your condition for any changes.  Keep all follow-up visits as told by your health care provider. This is important. Contact a health care provider if:  You have pain that gets worse.  You have a fever.  You do not have a bowel movement after 4 days.  You vomit.  You are not hungry.  You lose weight.  You are bleeding from the  anus.  You have thin, pencil-like stools. Get help right away if:  You have a fever and your symptoms suddenly get worse.  You leak stool or have blood in your stool.  Your abdomen is bloated.  You have severe pain in your abdomen.  You feel dizzy or you faint. This information is not intended to replace advice given to you by your health care provider. Make sure you discuss any questions you have with your health care provider. Document Released: 05/04/2004 Document Revised: 02/24/2016 Document Reviewed: 01/25/2016 Elsevier Interactive Patient Education  2018 [employer].    IF you received an x-ray today, you will receive an invoice from [facility]. Please contact [facility] Radiology at [phone] with questions or concerns regarding your invoice.   IF you received labwork today, you will receive an invoice from [place]. Please contact [facility] at [phone] with questions or concerns regarding your invoice.   Our billing staff will not be able to assist you with questions regarding bills from these companies.  You will be contacted with the lab results as soon as they are available. The fastest way to get your results is to activate your My Chart account. Instructions are located on the last page of this paperwork. If you have not heard from [place] regarding the results in 2 weeks, please contact this office.

## 2017-11-21 NOTE — Progress Notes (Signed)
Subjective:    Patient ID: [name], male    DOB: [birth year], 72 y.o.   MRN: [id number]  HPI [name] is a 72 y.o. male Presents today for: Chief Complaint  Patient presents with  . Medication Check   Here for follow-up, last seen 3 days ago.   Constipation: Reported some ongoing issues with harder stools at times in the past, recently had been more constipated.  Had tried eating more fiber and drinking water, but no specific stool softeners or other medications.  Plan on possible DRE to rule out fecal impaction, but apparently had been examined by his urologist that day without apparent fecal impaction.  Recommended MiraLAX twice per day then tapering frequency with ultimately change over to Colace or other over-the-counter stool softener.  Walking also discussed to stimulate bowel movements.  Able to have a BM after last OV, with few other episodes that day. Soreness has resolved. Taking miralax twice per day. Soft BM today.   Diabetes: A1c had increased from 5.4-7.9 most recently.  He did admit to missing some medications on occasion, was using a pill reminder/pillbox.  Recommended sending alarms for medication dosing, or change in location of medication as a visual reminder.   Patient Active Problem List   Diagnosis Date Noted  . Noncompliance with CPAP treatment 08/14/2017  . Excessive daytime sleepiness 04/09/2017  . Snoring 04/09/2017  . Memory impairment of gradual onset 04/09/2017  . Aortic atherosclerosis ([place]) 11/27/2016  . 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 Altered mental status 03/05/2013  . Respiratory failure, post-operative ([place]) 03/02/2013  . Hepatitis B 02/24/2013  . Cancer, hepatocellular ([place]) 02/10/2013  . Liver tumor-bleeding 01/15/2013  . Epidermal cyst 06/18/2012  . OSA (obstructive sleep apnea) 03/13/2012  . Abnormal leg movement 12/12/2011  . Blood pressure elevated 09/06/2011  . Diabetes mellitus  ([place]) 09/06/2011  . 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 BPH (benign prostatic hyperplasia) 09/06/2011  . Diverticula of colon 09/06/2011   Past Medical History:  Diagnosis Date  . Cancer [facility])    hepatocellular cancer   . Diabetes mellitus without complication ([place])   . Hepatitis   . Hyperlipidemia   . Hypertension   . Narcolepsy    per office visit note of 08/2011   . Sleep apnea    Past Surgical History:  Procedure Laterality Date  . COLONOSCOPY WITH PROPOFOL N/A 08/27/2017   Procedure: COLONOSCOPY WITH PROPOFOL;  Surgeon: Clarene Essex, MD;  Location: [facility] ENDOSCOPY;  Service: Endoscopy;  Laterality: N/A;  . HOT HEMOSTASIS N/A 08/27/2017   Procedure: HOT HEMOSTASIS (ARGON PLASMA COAGULATION/BICAP);  Surgeon: Clarene Essex, MD;  Location: [facility];  Service: Endoscopy;  Laterality: N/A;  . LAPAROSCOPY  03/02/2013   Procedure: LAPAROSCOPY DIAGNOSTIC;  Surgeon: [name], MD;  Location: [facility] ORS;  Service: General;;  . LIVER ULTRASOUND  03/02/2013   Procedure: LIVER ULTRASOUND;  Surgeon: [name], MD;  Location: [facility] ORS;  Service: General;;  . OPEN PARTIAL HEPATECTOMY   03/02/2013   Procedure: OPEN PARTIAL HEPATECTOMY [83];  Surgeon: [name], MD;  Location: [facility] ORS;  Service: General;;  DX LAPAROSCOPY, INTRAOPERATIVE LIVER ULTRASOUND, OPEN PARTIAL HEPATECTOMY  . pinched nerve in back     Allergies  Allergen Reactions  . Ace Inhibitors Swelling and Other (See Comments)    Angioedema - face.    Prior to Admission medications   Medication Sig Start Date End Date Taking? Authorizing Provider  ACCU-CHEK SOFTCLIX LANCETS lancets Once per day testing.  08/05/17  Yes Wendie Agreste, MD  alfuzosin (UROXATRAL) 10 MG 24 hr tablet Take 1 tablet (10 mg total) by mouth at bedtime. 11/07/17  Yes Wendie Agreste, MD  amphetamine-dextroamphetamine (ADDERALL XR) 30 MG 24 hr capsule Take 1 capsule (30 mg total) by mouth every morning. 10/30/17  Yes Ward Givens, NP  blood glucose meter kit and supplies  Dispense based on patient and insurance preference. Use up to four times daily as directed. (FOR ICD-9 250.00, 250.01). 04/25/17  Yes Wendie Agreste, MD  donepezil (ARICEPT) 10 MG tablet Take 1 tablet (10 mg total) by mouth at bedtime. 11/05/17  Yes Dohmeier, Asencion Partridge, MD  ferrous sulfate (KP FERROUS SULFATE) 325 (65 FE) MG tablet Take 325 mg by mouth 3 (three) times daily with meals.    Yes [provider]  glipiZIDE (GLUCOTROL) 5 MG tablet TAKE 1 TABLET BY MOUTH&amp;nbsp;&amp;nbsp;TWICE A DAY BEFORE MEALS 11/07/17  Yes Wendie Agreste, MD  glucose blood (ACCU-CHEK AVIVA PLUS) test strip 1 each by Other route as needed for other. Use as instructed - once per day testing for now. 08/05/17  Yes Wendie Agreste, MD  losartan (COZAAR) 25 MG tablet Take 1 tablet (25 mg total) by mouth daily. 11/07/17  Yes Wendie Agreste, MD  metFORMIN (GLUCOPHAGE) 1000 MG tablet Take 1 tablet (1,000 mg total) by mouth 2 (two) times daily with a meal. 11/07/17  Yes Wendie Agreste, MD  metoprolol succinate (TOPROL-XL) 50 MG 24 hr tablet Take 1 tablet (50 mg total) by mouth daily. Take with or immediately following a meal. 11/07/17  Yes Wendie Agreste, MD  pravastatin (PRAVACHOL) 20 MG tablet Take 1 tablet (20 mg total) by mouth at bedtime. 11/07/17  Yes Wendie Agreste, MD  spironolactone (ALDACTONE) 25 MG tablet Take 1 tablet (25 mg total) by mouth 2 (two) times daily. 11/07/17  Yes Wendie Agreste, MD   Social History   Socioeconomic History  . Marital status: Single    Spouse name: Not on file  . Number of children: 2  . Years of education: 15+  . Highest education level: Not on file  Occupational History  . Not on file  Social Needs  . Financial resource strain: Not on file  . Food insecurity:    Worry: Not on file    Inability: Not on file  . Transportation needs:    Medical: Not on file    Non-medical: Not on file  Tobacco Use  . Smoking status: Former Smoker    Years: 1.00  .  Smokeless tobacco: Never Used  Substance and Sexual Activity  . Alcohol use: No  . Drug use: No  . Sexual activity: Yes  Lifestyle  . Physical activity:    Days per week: Not on file    Minutes per session: Not on file  . Stress: Not on file  Relationships  . Social connections:    Talks on phone: Not on file    Gets together: Not on file    Attends religious service: Not on file    Active member of club or organization: Not on file    Attends meetings of clubs or organizations: Not on file    Relationship status: Not on file  . Intimate partner violence:    Fear of current or ex partner: Not on file    Emotionally abused: Not on file    Physically abused: Not on file    Forced sexual activity: Not on  file  Other Topics Concern  . Not on file  Social History Narrative   Patient is single and lives alone- became widower when 1 child was 58 year old, divorced 2nd marriage   Has #2 grown children, not in area or involved in his life   Patient is retired.   Patient has a college education.   Patient is right-handed.   Patient drinks maybe two sodas daily.    Review of Systems  Constitutional: Negative for chills and fever.  Gastrointestinal: Negative for abdominal pain, blood in stool, constipation (resolving. ), diarrhea, nausea and vomiting.       Objective:   Physical Exam  Constitutional: He is oriented to person, place, and time. He appears well-developed and well-nourished.  HENT:  Head: Normocephalic and atraumatic.  Eyes: Pupils are equal, round, and reactive to light. EOM are normal.  Neck: No JVD present. Carotid bruit is not present.  Cardiovascular: Normal rate, regular rhythm and normal heart sounds.  No murmur heard. Pulmonary/Chest: Effort normal and breath sounds normal. He has no rales.  Abdominal: Soft. Bowel sounds are normal. He exhibits no distension. There is no tenderness. There is no rebound and no guarding.  Musculoskeletal: He exhibits no edema.   Neurological: He is alert and oriented to person, place, and time.  Skin: Skin is warm and dry. No rash noted.  Psychiatric: He has a normal mood and affect. His behavior is normal.  Vitals reviewed.  Vitals:   11/21/17 0838  BP: 120/60  Pulse: 71  Temp: 97.6 F (36.4 C)  TempSrc: Oral  SpO2: 98%  Weight: 209 lb 12.8 oz (95.2 kg)  Height: '5\' 9"'  (1.753 m)       Assessment & Plan:   [name] is a 72 y.o. male Constipation, unspecified constipation type  -Improved.  Continue bowel regimen discussed with tapering dose of MiraLAX, water, fiber diet with RTC precautions given.  Handout given.  Type 2 diabetes mellitus with hyperglycemia, without long-term current use of insulin (HCC)  -A1c elevation as above.  Suspected missed doses.  Pill minder, other helpful hints discussed and recheck in 4 weeks with home readings, bring medications at that time to verify consistency with our list.  No orders of the defined types were placed in this encounter.  Patient Instructions   Ok to decrease miralax to once per day as long as you are having bowel movements daily.  If any diarrhea, stop the MIralax. Continue to drink water and obtain fiber in diet (whole grains, vegetables).   Follow-up in the next 4 to 6 weeks with your medications and home blood sugar readings.  Make sure you continue to use the pill reminder box, alarms, or change in location of that medication so that you can minimize any missed doses.  Constipation, Adult Constipation is when a person has fewer bowel movements in a week than normal, has difficulty having a bowel movement, or has stools that are dry, hard, or larger than normal. Constipation may be caused by an underlying condition. It may become worse with age if a person takes certain medicines and does not take in enough fluids. Follow these instructions at home: Eating and drinking   Eat foods that have a lot of fiber, such as fresh fruits and  vegetables, whole grains, and beans.  Limit foods that are high in fat, low in fiber, or overly processed, such as french fries, hamburgers, cookies, candies, and soda.  Drink enough fluid to keep your  urine clear or pale yellow. General instructions  Exercise regularly or as told by your health care provider.  Go to the restroom when you have the urge to go. Do not hold it in.  Take over-the-counter and prescription medicines only as told by your health care provider. These include any fiber supplements.  Practice pelvic floor retraining exercises, such as deep breathing while relaxing the lower abdomen and pelvic floor relaxation during bowel movements.  Watch your condition for any changes.  Keep all follow-up visits as told by your health care provider. This is important. Contact a health care provider if:  You have pain that gets worse.  You have a fever.  You do not have a bowel movement after 4 days.  You vomit.  You are not hungry.  You lose weight.  You are bleeding from the anus.  You have thin, pencil-like stools. Get help right away if:  You have a fever and your symptoms suddenly get worse.  You leak stool or have blood in your stool.  Your abdomen is bloated.  You have severe pain in your abdomen.  You feel dizzy or you faint. This information is not intended to replace advice given to you by your health care provider. Make sure you discuss any questions you have with your health care provider. Document Released: 05/04/2004 Document Revised: 02/24/2016 Document Reviewed: 01/25/2016 Elsevier Interactive Patient Education  2018 [employer].    IF you received an x-ray today, you will receive an invoice from [facility]. Please contact [facility] at [phone] with questions or concerns regarding your invoice.   IF you received labwork today, you will receive an invoice from [facility]. Please contact [facility] at [phone] with  questions or concerns regarding your invoice.   Our billing staff will not be able to assist you with questions regarding bills from these companies.  You will be contacted with the lab results as soon as they are available. The fastest way to get your results is to activate your My Chart account. Instructions are located on the last page of this paperwork. If you have not heard from [place] regarding the results in 2 weeks, please contact this office.       Signed,   [name], MD [facility].  11/21/17 9:26 AM

## 2017-11-26 ENCOUNTER — Encounter: Payer: Self-pay | Admitting: *Deleted

## 2017-11-26 ENCOUNTER — Other Ambulatory Visit: Payer: Self-pay | Admitting: *Deleted

## 2017-11-26 NOTE — Patient Outreach (Signed)
[facility]) Care Management  [place]  11/26/2017   CAELLUM MANCIL [birth year] [id number]   RN Health Coach Monthly Outreach   Referral Date: 05/23/2017 Referral Source:  Primary MD Reason for Referral:  Medication Assistance Insurance:  [employer]   Outreach Attempt:  Successful telephone outreach to patient for monthly follow up.  HIPAA verified with patient.  Patient reports he has not felt well in the past month.  Denies being sick, states "just not feeling well".  States his constipation has resolved with the use of Miralax.  Discussed with patient the increase of Hgb A1C to 7.9 on 11/07/2017 from 5.4.  Verbalizes he should be taking his blood sugars daily but admits to forgetting to take them.  Reports taking his blood sugar 3 times in the last week.  This mornings fasting blood sugar was 142 and patient reporting ranges of 130-150's.  Denies any hypoglycemic or hyperglycemic events. Denies any falls in the last few months.  Reports difficulties with finances and concerns about paying hospital bills of $8000.  States he is having trouble affording his monthly medications and having food for the month.  Reports one of his medication monthly co pays is >$300 (unsure of the medication but patient thinks it is his Alfuzosin.  Patient reports using a pill box as encouraged by his primary care provider to help with compliance with his medications.  He states the pill box he uses is a generic pill box without electronic reminders.  Discussed [facility] Pharmacist and [facility] Social Work consults with patient and patient verbally agrees with the referrals.   Encounter Medications:  Outpatient Encounter Medications as of 11/26/2017  Medication Sig  . ACCU-CHEK SOFTCLIX LANCETS lancets Once per day testing.  [name] Kitchen alfuzosin (UROXATRAL) 10 MG 24 hr tablet Take 1 tablet (10 mg total) by mouth at bedtime.  [name] Kitchen amphetamine-dextroamphetamine (ADDERALL XR) 30 MG 24 hr capsule  Take 1 capsule (30 mg total) by mouth every morning.  . blood glucose meter kit and supplies Dispense based on patient and insurance preference. Use up to four times daily as directed. (FOR ICD-9 250.00, 250.01).  [name] Kitchen donepezil (ARICEPT) 10 MG tablet Take 1 tablet (10 mg total) by mouth at bedtime.  . ferrous sulfate (KP FERROUS SULFATE) 325 (65 FE) MG tablet Take 325 mg by mouth 3 (three) times daily with meals.   [name] Kitchen glipiZIDE (GLUCOTROL) 5 MG tablet TAKE 1 TABLET BY MOUTH&amp;nbsp;&amp;nbsp;TWICE A DAY BEFORE MEALS  . glucose blood (ACCU-CHEK AVIVA PLUS) test strip 1 each by Other route as needed for other. Use as instructed - once per day testing for now.  . losartan (COZAAR) 25 MG tablet Take 1 tablet (25 mg total) by mouth daily.  . metFORMIN (GLUCOPHAGE) 1000 MG tablet Take 1 tablet (1,000 mg total) by mouth 2 (two) times daily with a meal.  . metoprolol succinate (TOPROL-XL) 50 MG 24 hr tablet Take 1 tablet (50 mg total) by mouth daily. Take with or immediately following a meal.  . pravastatin (PRAVACHOL) 20 MG tablet Take 1 tablet (20 mg total) by mouth at bedtime.  [name] Kitchen spironolactone (ALDACTONE) 25 MG tablet Take 1 tablet (25 mg total) by mouth 2 (two) times daily.   No facility-administered encounter medications on file as of 11/26/2017.     Functional Status:  In your present state of health, do you have any difficulty performing the following activities: 06/10/2017 05/27/2017  Hearing? Y Y  Comment - hearing aid in right ear,  left ear completely deaf  Vision? Y Y  Comment - wears glasses at all times  Difficulty concentrating or making decisions? N Y  Comment - increasing memory loss, sees neurologist and taking medication for memory  Walking or climbing stairs? N N  Dressing or bathing? N N  Doing errands, shopping? N N  Preparing Food and eating ? - N  Using the Toilet? - N  In the past six months, have you accidently leaked urine? - N  Do you have problems with loss of bowel  control? - N  Managing your Medications? - N  Managing your Finances? - N  Housekeeping or managing your Housekeeping? - N  Some recent data might be hidden    Fall/Depression Screening: Fall Risk  11/26/2017 11/21/2017 11/18/2017  Falls in the past year? No No No  Comment - - -  Number falls in past yr: (No Data) - -  Comment denies any falls in the last few months - -  Injury with Fall? - - -  Comment - - -  Risk Factor Category  - - -  Risk for fall due to : - - -  Risk for fall due to: Comment - - -  Follow up - - -   PHQ 2/9 Scores 11/21/2017 11/18/2017 11/07/2017 08/05/2017 06/10/2017 05/27/2017 05/20/2017  PHQ - 2 Score 0 0 0 0 0 0 0  PHQ- 9 Score - - - - - 2 -    [facility] CM Care Plan Problem One     Most Recent Value  Care Plan Problem One  Knowledge and financial deficiet related to self care management of diabetes  Role Documenting the Problem One  [place] for Problem One  Active  [facility] Long Term Goal   Patient will reducec Hgb A1C by 0.5 points in the next 90 days  [facility] Long Term Goal Start Date  11/26/17  Interventions for Problem One Long Term Goal  Current care plan reviewed and discussed with patient, [facility] CSW consult placed to assist with financial difficulties and food resources, medications discussed and compliance encouraged, discussed what Hgb A1C means and his current increased level, reviewed signs and symptoms of hypo and hyperglycemia with patient, encouraged patient to keep and attend scheduled medical appointments, encouraged patient to continue to review Living Well with Diabetes Booklet  [facility] CM Short Term Goal #1   Patient will monitor and record CBGs daily for the next 30 days  [facility] CM Short Term Goal #1 Start Date  11/26/17  [facility] CM Short Term Goal #1 Met Date  11/26/17  Interventions for Short Term Goal #1  Reviewed with patient physicians request to monitor blood sugars daily and to keep record of readings to discuss at next scheduled appointment,  encouraged patient to monitor blood sugars daily, enroucaged patient to utilize Calendar Booklet to document daily blood sugar readings as well as medical appointments, encouraged patient to take CBG log to medical appointments, discussed with patient importance of monitoring blood sugars  [facility] CM Short Term Goal #2   Patient will report taking his medications every day for the next 30 days.  [facility] CM Short Term Goal #2 Start Date  11/26/17  [facility] CM Short Term Goal #2 Met Date  11/26/17  Interventions for Short Term Goal #2  Reviewed medications with patient, encouraged daily medication compliance, encouraged the use of medication pill box to increase compliance, [facility] Pharmacist consulted for medication assistance and compliance  Appointments:  Reports seeing his primary care provider, Dr. Carlota Raspberry on 11/21/2017 and has scheduled follow up appointment on 12/19/2017.  Plan: RN Health Coach will send [facility] referral for possible assistance with community resources related to financial difficulties and food resources. RN Health Coach will send [place] referral for medication assistance and medication compliance. RN Health Coach will send primary MD Quarterly Update. RN Health Coach will make next monthly outreach to patient in the month of May.  [place] Coach [phone] [email or web address]

## 2017-11-27 ENCOUNTER — Other Ambulatory Visit: Payer: Self-pay | Admitting: Pharmacist

## 2017-11-27 NOTE — Patient Outreach (Addendum)
[facility]) Care Management  11/27/2017  [name] 1946-05-31 [id number]  73 y.o. year old male referred to [place] for Medication Assistance (Alfuzosin) and Medication Adherence (Initial phamacy outreach)   Was unable to reach patient via telephone today. Unfortunately, voicemail has not been set up. (unsuccessful outreach #1).  Plan: Will send unsuccessful outreach letter and follow-up within 2-4  business days via telephone.   Charlett Lango, PharmD Clinical Pharmacist, [facility] [phone]

## 2017-11-28 ENCOUNTER — Ambulatory Visit: Payer: Self-pay | Admitting: Pharmacist

## 2017-11-28 ENCOUNTER — Other Ambulatory Visit: Payer: Self-pay | Admitting: Pharmacist

## 2017-11-28 NOTE — Patient Outreach (Signed)
[facility]) Care Management  11/28/2017  [name] 05-22-1946 [id number]   72 y.o. year old male referred to [place] for Medication Adherence and Medication Assistance   Patient with [place] Medicare Advantage plan as primary coverage and [employer] plan as secondary coverage.   Patient confirms identity with HIPAA-identifiers x2 and gives verbal consent to speak over the phone about medications.   Plan:  Scheduled home visit with patient for next week to review medications and assess any medication assistance programs available to patient.   Charlett Lango, PharmD Clinical Pharmacist, [facility] [phone]

## 2017-11-29 ENCOUNTER — Other Ambulatory Visit: Payer: Self-pay

## 2017-11-29 NOTE — Patient Outreach (Signed)
[facility]) Care Management  11/29/2017  [name] 04-22-1946 [id number]   Social Work referral received from [place] on 11/26/17 regarding financial challenges for patient and need for food resources.   CSW spoke with patient today to discuss referral.  CSW talked with patient about completing application for Medicaid and [place] financial assistance.  CSW spoke with patient about food resources in [place].   CSW scheduled home visit with patient for 03/27/80 to complete applications and to provide food resources to patient.     Geographical information systems officer, BSW Scientist, research (life sciences) Health System Mailing Address:  [address] [address] Physical Address: [address]  [place] [address] Main #:  [phone] Cell #:  [phone] Fax #:  [phone] [email or web address]

## 2017-12-03 ENCOUNTER — Other Ambulatory Visit: Payer: Self-pay

## 2017-12-03 ENCOUNTER — Encounter: Payer: Self-pay | Admitting: Pharmacist

## 2017-12-03 ENCOUNTER — Other Ambulatory Visit: Payer: Self-pay | Admitting: Pharmacist

## 2017-12-03 NOTE — Patient Outreach (Signed)
[place] [facility]) Care Management  [place]   12/03/2017  [name] 01-10-1946 [id number]  Subjective: 72 y.o. year old male referred to [place] for Medication Adherence and Medication Assistance (pharmacy home visit, alfuzosin patient assistance) Referred by Hubert Azure, RN, [place]  PMH s/f: type 2 diabetes, OSA, BPH, narcolepsy, elevated blood pressure, hepatocellular cancer, Hepatitis B  Patient with [employer] plan.   Patient confirms identity with HIPAA-identifiers x2 and gives consent to [facility] care and pharmacist. Consent uploaded into Media tab.   SUBJECTIVE:   Medication Adherence: Patient does admit to forgetting to take medications sometimes. Noted many of his prescription bottles should be due for refills by today's date. Patient states sometimes he will add new bottle's contents to old prescription bottles once he gets a new shipment from mail order pharmacy. Patient also states he forgot to test his BG this AM, he "usually" checks in the morning before breakfast and his readings are typically in the 130s.   Medication Assistance:  Patient states he has difficulty paying for medications at times, particularly Adderall XR and alfuzosin.   Objective:   Encounter Medications: Outpatient Encounter Medications as of 12/03/2017  Medication Sig Note  . ACCU-CHEK SOFTCLIX LANCETS lancets Once per day testing.   [name] Kitchen alfuzosin (UROXATRAL) 10 MG 24 hr tablet Take 1 tablet (10 mg total) by mouth at bedtime.   [name] Kitchen amphetamine-dextroamphetamine (ADDERALL XR) 30 MG 24 hr capsule Take 1 capsule (30 mg total) by mouth every morning.   . blood glucose meter kit and supplies Dispense based on patient and insurance preference. Use up to four times daily as directed. (FOR ICD-9 250.00, 250.01).   [name] Kitchen donepezil (ARICEPT) 10 MG tablet Take 1 tablet (10 mg total) by mouth at bedtime.   . ferrous sulfate (KP FERROUS SULFATE) 325  (65 FE) MG tablet Take 325 mg by mouth 3 (three) times daily with meals.    [name] Kitchen glipiZIDE (GLUCOTROL) 5 MG tablet TAKE 1 TABLET BY MOUTH  TWICE A DAY BEFORE MEALS 12/03/2017: Takes 2 at breakfast  . glucose blood (ACCU-CHEK AVIVA PLUS) test strip 1 each by Other route as needed for other. Use as instructed - once per day testing for now.   . losartan (COZAAR) 25 MG tablet Take 1 tablet (25 mg total) by mouth daily.   . metFORMIN (GLUCOPHAGE) 1000 MG tablet Take 1 tablet (1,000 mg total) by mouth 2 (two) times daily with a meal.   . metoprolol succinate (TOPROL-XL) 50 MG 24 hr tablet Take 1 tablet (50 mg total) by mouth daily. Take with or immediately following a meal.   . pravastatin (PRAVACHOL) 20 MG tablet Take 1 tablet (20 mg total) by mouth at bedtime.   [name] Kitchen spironolactone (ALDACTONE) 25 MG tablet Take 1 tablet (25 mg total) by mouth 2 (two) times daily. 12/03/2017: Takes 2 at breakfast   No facility-administered encounter medications on file as of 12/03/2017.     Functional Status: In your present state of health, do you have any difficulty performing the following activities: 06/10/2017 05/27/2017  Hearing? 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 Y Y  Comment - wears glasses at all times  Difficulty concentrating or making decisions? N Y  Comment - increasing memory loss, sees neurologist and taking medication for memory  Walking or climbing stairs? N N  Dressing or bathing? N N  Doing errands, shopping? N N  Preparing Food and eating ? - N  Using the Toilet? - N  In the past six months, have you accidently leaked urine? - N  Do you have problems with loss of bowel control? - N  Managing your Medications? - N  Managing your Finances? - N  Housekeeping or managing your Housekeeping? - N  Some recent data might be hidden    Fall/Depression Screening: Fall Risk  11/26/2017 11/21/2017 11/18/2017  Falls in the past year? No No No  Comment - - -  Number falls  in past yr: (No Data) - -  Comment denies any falls in the last few months - -  Injury with Fall? - - -  Comment - - -  Risk Factor Category  - - -  Risk for fall due to : - - -  Risk for fall due to: Comment - - -  Follow up - - -   PHQ 2/9 Scores 11/21/2017 11/18/2017 11/07/2017 08/05/2017 06/10/2017 05/27/2017 05/20/2017  PHQ - 2 Score 0 0 0 0 0 0 0  PHQ- 9 Score - - - - - 2 -   Assessment:  Drugs sorted by system:  Neurologic/Psychologic: Adderall XR, donepezil  Cardiovascular: losartan, metoprolol succinate, pravastatin, spironolactone  Endocrine: glipizide, metformin  Vitamins/Minerals: iron   Miscellaneous: alfuzosin  Medications to avoid in the elderly: Adderall Drug interactions: risk of hyperkalemia (spironolactone/losartan) Other issues noted: Lostartan and ACE inhibitor allergy: change for cross reaction; patient is taking both doses of spironolactone and glipizide in the morning instead of BID;  Plan: 1. Provided patient with [facility] pill tray that has slots for morning and evening medications. Showed patient how to fill up tray.  2. Applied to Extra Help (LIS) from Social Security to see if patient will qualify for reduced copays.  3. Contact Dr. Carlota Raspberry about spironolactone and glipizide. Inquire about switching glipizide to ER formulation so patient may take once daily in the morning 4. Follow up later this week to determine patient's eligibility for medication assistance program for alfuzosin.   Charlett Lango, PharmD Clinical Pharmacist, [place] [facility] [phone]

## 2017-12-04 NOTE — Patient Outreach (Signed)
[place] [facility]) Care Management  12/04/2017  MALICHI PALARDY 12/06/1945 [id number]   CSW met with patient at his home to complete Medicaid and [employer] applications.  CSW informed patient of documentation that will need to be gathered and submitted with applications.  CSW provided a checklist of this required documentation to assist patient with process. CSW will follow up with patient in two weeks to determine if applications have been submitted.    Geographical information systems officer, Systems developer

## 2017-12-05 ENCOUNTER — Ambulatory Visit: Payer: Self-pay | Admitting: Pharmacist

## 2017-12-05 ENCOUNTER — Other Ambulatory Visit: Payer: Self-pay | Admitting: Pharmacist

## 2017-12-05 ENCOUNTER — Telehealth: Payer: Self-pay | Admitting: Family Medicine

## 2017-12-05 NOTE — Patient Outreach (Signed)
[place] [facility]) Care Management  12/05/2017  ZAKARIYE NEE 01-23-1946 [id number]   72 y.o. year old male referred to [place] for Medication Adherence (follow up medication adherence) and Medication Assistance (alfuzosin)  Patient with [employer] plan.   1. Contacted Dr. Vonna Kotyk office regarding patient's use of glipizide and spironolactone. Both are prescribed BID, but patient takes 2 tablets in the morning instead. Suggested switching to glipizide ER formulation, which is on the same tier of pharmacy formulary. Awaiting call back from Dr. Carlota Raspberry  2. Forwarding medication assistance for alfuzosin through Rx Outreach. Unfortunately, unaware at this time of any copay assistance for Adderall XR.   Charlett Lango, PharmD Clinical Pharmacist, [place] [facility] [phone]

## 2017-12-05 NOTE — Telephone Encounter (Signed)
Copied from [place] [phone]. Topic: Quick Communication - See Telephone Encounter >> Dec 05, 2017  3:17 PM Vernona Rieger wrote: CRM for notification. See Telephone encounter for: 12/05/17.  Almyra Free from [facility] called and wanted to let Dr Carlota Raspberry know that she had a home visit with him. He is taking his glipiZIDE (GLUCOTROL) 5 MG tablet at the same time instead of one in the morning and one in the evening. She wanted to know if Dr Carlota Raspberry wanted to switch it to the extended release. He is also taking the spironolactone (ALDACTONE) 25 MG tablet the same way. He is not having any dizziness right now .Please advise. Call back is [phone]

## 2017-12-06 ENCOUNTER — Ambulatory Visit: Payer: Self-pay | Admitting: Pharmacist

## 2017-12-06 NOTE — Telephone Encounter (Signed)
Pt advised of correct medication regimen. Pt stated that he was taking this correctly and would continue.

## 2017-12-06 NOTE — Telephone Encounter (Signed)
See message below °

## 2017-12-06 NOTE — Telephone Encounter (Signed)
He should be taking one of the glipizide with each meal, and Aldactone twice per day.  If there is a problem with compliance at twice per day dosing, we can look at longer acting, but would prefer we stay at twice daily dosing if possible.  Let me know.

## 2017-12-06 NOTE — Telephone Encounter (Signed)
See my message.

## 2017-12-09 ENCOUNTER — Other Ambulatory Visit: Payer: Self-pay | Admitting: Pharmacy Technician

## 2017-12-09 NOTE — Patient Outreach (Signed)
[facility]) Care Management  12/09/2017  [name] 1946-04-29 [id number]   Faxed Dr. [name] requesting he fax or send e-script for generic Uroxatrol to Rx Outreach.  [name], [place] Management [phone]

## 2017-12-10 ENCOUNTER — Other Ambulatory Visit: Payer: Self-pay | Admitting: Pharmacy Technician

## 2017-12-10 ENCOUNTER — Telehealth: Payer: Self-pay | Admitting: Neurology

## 2017-12-10 ENCOUNTER — Other Ambulatory Visit: Payer: Self-pay | Admitting: Pharmacist

## 2017-12-10 DIAGNOSIS — G4733 Obstructive sleep apnea (adult) (pediatric): Secondary | ICD-10-CM | POA: Diagnosis not present

## 2017-12-10 NOTE — Patient Outreach (Signed)
[place] [facility]) Care Management  [place]   12/10/2017  [name] 1946-04-29 [id number]  72 y.o. year old male referred to [place] for follow up Medication Assistance (alfuzosin, Adderall XR). Patient verified HIPAA identifiers x 2.   Patient with [employer].  Patient has been denied Medicare Extra Help for being over income limit.   1. Completed Rx Outreach with patient at his home today (signature).     2. Called [facility] mail order pharmacy to ensure alfuzosin would be covered via mail order. Technician states that alfuzosin copay would be $0 for 90 days supply. Patient will no longer need to use Rx Outreach for this medication. Updated patient's preferred pharmacy to [facility] mail order in chart.   3. Contacted Dr. Vonna Kotyk office [facility]) regarding sending in a alfuzosin prescription to [facility] mail order and clarified that only a prescription and paperwork were needed for Adderall XR for the Rx Outreach application. Apologized for the confusion.   4. Reviewed medication with patient, particularly taking spironolactone and glipizide BID as patient had previously been taking 2 tablets in the morning. Patient verbalized understanding and states he is using his pill boxes.   5. Will have Etter Sjogren, CPhT at [facility], follow up with Rx Outreach. Once assistance has been obtained successfully, will close pharmacy episode at that time. Patient has pharmacist contact number should he have any questions or concerns.   Charlett Lango, PharmD Clinical Pharmacist, [place] [facility] [phone]

## 2017-12-10 NOTE — Telephone Encounter (Signed)
Please see message below

## 2017-12-10 NOTE — Patient Outreach (Signed)
[facility]) Care Management  12/10/2017  JAMARION JUMONVILLE 1945/10/14 [id number]   [place] to verify patients co-pay for his generic Adderall XR 30mg  after receiving a call from [facility], Dr. Allie Bossier nurse. Myriam Jacobson informed that in October 2018 she was able have a PA approved for a Tier exception approved thru [facility]. The PA is approved until 08/19/18.   Upon speaking to pharmacist at [facility], I was informed that patients co-pay on 08/25/17 was $55.35 for a 30 day supply. Rx Outreach would not be able to offer it any cheaper than $80 for a 30 day supply.  Will route note to [place] for case closure.  Maud Deed Mill[place], [place] Management [phone]

## 2017-12-10 NOTE — Telephone Encounter (Signed)
I have also received a fax from [place] with Outpatient outreach in regards to patients Alfuzosin medication. Pt is requesting the Alfuzosin ER faxed to Rx Outreach for it will be more cost effective for patient ($35 for 90 day supply).   The Alfuzosin 10mg  has been perscribed on 11/07/2017. Please advise if the patient is able to have the ER version of med.   Thanks, [employer]

## 2017-12-10 NOTE — Telephone Encounter (Signed)
Received a call from Almyra Free about the fax received yesterday. Pt is only needing the Adderall thru Outpatient Outreach RX.   The patient can get alfuzosin from [place] in a 90 day supply for free if new RX is sent.   [place], [place] [employer] [phone] (Phone) [phone] (Fax)

## 2017-12-10 NOTE — Telephone Encounter (Signed)
Called Caryl Pina and made her aware that it was my understanding that I did a PA so the pt could receive the medication at a tier 3 copayment. This pa was approved until 08/19/2018. She was unaware of this being completed for the pt. I also advised Caryl Pina that good rx coupon right now on website has that the pt can get this medication cheaper then 80$/mth at a couple of different places. Caryl Pina will further investigate these options for the pt and call me back if anything is needed

## 2017-12-10 NOTE — Patient Outreach (Signed)
[name] [facility]) Care Management  12/10/2017  [name] 28-Jan-1946 [id number]  Informed by Golden Hurter that patient doesn't need the Alfuzosin ER from Rx Outreach, he actually needs his generic Adderall XR 30mg  from the program. Faxed in application to Rx Outreach so that his profile will be set up.  Contacted Dr. Asencion Partridge Dohmeier's office in regards to prescription being mailed or E-scribed to Rx Outreach. Sandy informed me that she would take the message down and have someone contact me.  Maud Deed [place], [place] Management [phone]

## 2017-12-10 NOTE — Telephone Encounter (Signed)
[facility] [phone] called they have found a program called RX Outreach that will help loser the for the pt's  amphetamine-dextroamphetamine (ADDERALL XR) 30 MG 24 hr capsule to $80/mth if it can be escribed to them or mailed to them. Please call to discuss.

## 2017-12-11 MED ORDER — ALFUZOSIN HCL ER 10 MG PO TB24: 10.0000 mg | ORAL_TABLET | Freq: Every day | ORAL | 1 refills | Status: DC

## 2017-12-11 NOTE — Telephone Encounter (Signed)
uroxatral sent to [place] is prescribed through neurologist. Thanks.

## 2017-12-13 ENCOUNTER — Encounter: Payer: Self-pay | Admitting: Pharmacist

## 2017-12-13 NOTE — Patient Outreach (Signed)
[place] [facility]) Care Management  12/13/2017  [name] 01-05-1946 [id number]  Patient uses [facility] mail order pharmacy for $0 copays on Tier 1 and Tier 2 generic medications. A tier exception had previously been arranged for the patient by [facility] clinical pharmacy services and does not expire until 08/19/2018.   Resolved pharmacy episode and removed clinical pharmacy services from the care team. Will send in basket message to [facility] care members still involved in the case, Amber Chrismon, BSW and Hubert Azure, telephonic RN.   Charlett Lango, PharmD Clinical Pharmacist, [place] [facility] [phone]

## 2017-12-18 ENCOUNTER — Ambulatory Visit: Payer: Self-pay

## 2017-12-19 ENCOUNTER — Ambulatory Visit: Payer: Self-pay

## 2017-12-19 ENCOUNTER — Other Ambulatory Visit: Payer: Self-pay

## 2017-12-19 ENCOUNTER — Ambulatory Visit: Payer: Medicare Other | Admitting: Family Medicine

## 2017-12-19 NOTE — Patient Outreach (Signed)
[place] [facility]) Care Management  12/19/2017  KASHTEN GOWIN 1946/01/10 [id number]  BSW attempted to contact patient to follow up on Medicaid application.  Patient did not answer the phone and no option to leave voicemail message.  BSW mailed unsuccessful outreach letter.  BSW will make second attempt within 3-4 business days.  [name], BSW Social Worker [phone]

## 2017-12-23 ENCOUNTER — Ambulatory Visit: Payer: Medicare Other | Admitting: Family Medicine

## 2017-12-24 ENCOUNTER — Other Ambulatory Visit: Payer: Self-pay

## 2017-12-24 NOTE — Patient Outreach (Signed)
[place] [facility]) Care Management  12/24/2017  [name] 02-13-1946 [id number]   Second outreach to patient to follow up about submission of Medicaid application.  Patient reported that he received a letter in the mail about being denied for some type of financial assistance.  Patient was unable to remember whom the letter was from but said that he believes he still has it.  Patient said that he will look for letter and BSW can follow up with him again. Patient reported that he has not submitted Medicaid application because he does not think that he will be approved.  BSW will follow up with patient again before the end of the week.   [name], BSW Social Worker [phone]

## 2017-12-25 ENCOUNTER — Ambulatory Visit: Payer: Self-pay

## 2017-12-26 ENCOUNTER — Other Ambulatory Visit: Payer: Self-pay | Admitting: *Deleted

## 2017-12-26 NOTE — Patient Outreach (Signed)
[place] [facility]) Care Management  12/26/2017  [name] Jun 18, 1946 [id number]   [place] Monthly Outreach  Referral Date:05/23/2017 Referral Source:Primary MD Reason for Referral:Medication Assistance Insurance:[employer] Medicare   Outreach Attempt:  Outreach attempt #1 to patient for monthly follow up.  Patient answered and stated he was not at home at this time.  Requesting a telephone call back.  Plan:   RN Health Coach will make another outreach attempt to patient within 3-4 business days if no return call back from patient.  [place] [phone] [name]

## 2017-12-27 ENCOUNTER — Other Ambulatory Visit: Payer: Self-pay | Admitting: *Deleted

## 2017-12-27 ENCOUNTER — Emergency Department (HOSPITAL_COMMUNITY): Payer: Medicare Other

## 2017-12-27 ENCOUNTER — Other Ambulatory Visit: Payer: Self-pay

## 2017-12-27 ENCOUNTER — Encounter (HOSPITAL_COMMUNITY): Payer: Self-pay

## 2017-12-27 ENCOUNTER — Emergency Department (HOSPITAL_COMMUNITY)
Admission: EM | Admit: 2017-12-27 | Discharge: 2017-12-27 | Disposition: A | Discharge status: Home / Self Care | Payer: Medicare Other | Attending: Emergency Medicine | Admitting: Emergency Medicine

## 2017-12-27 DIAGNOSIS — I10 Essential (primary) hypertension: Secondary | ICD-10-CM | POA: Insufficient documentation

## 2017-12-27 DIAGNOSIS — R1013 Epigastric pain: Secondary | ICD-10-CM | POA: Diagnosis not present

## 2017-12-27 DIAGNOSIS — K8020 Calculus of gallbladder without cholecystitis without obstruction: Secondary | ICD-10-CM | POA: Diagnosis not present

## 2017-12-27 DIAGNOSIS — K8051 Calculus of bile duct without cholangitis or cholecystitis with obstruction: Secondary | ICD-10-CM | POA: Insufficient documentation

## 2017-12-27 DIAGNOSIS — K8050 Calculus of bile duct without cholangitis or cholecystitis without obstruction: Secondary | ICD-10-CM

## 2017-12-27 DIAGNOSIS — E119 Type 2 diabetes mellitus without complications: Secondary | ICD-10-CM | POA: Diagnosis not present

## 2017-12-27 DIAGNOSIS — Z87891 Personal history of nicotine dependence: Secondary | ICD-10-CM | POA: Diagnosis not present

## 2017-12-27 DIAGNOSIS — Z79899 Other long term (current) drug therapy: Secondary | ICD-10-CM | POA: Insufficient documentation

## 2017-12-27 DIAGNOSIS — D61818 Other pancytopenia: Secondary | ICD-10-CM | POA: Insufficient documentation

## 2017-12-27 DIAGNOSIS — R1011 Right upper quadrant pain: Secondary | ICD-10-CM | POA: Diagnosis not present

## 2017-12-27 DIAGNOSIS — Z7984 Long term (current) use of oral hypoglycemic drugs: Secondary | ICD-10-CM | POA: Diagnosis not present

## 2017-12-27 DIAGNOSIS — R079 Chest pain, unspecified: Secondary | ICD-10-CM | POA: Diagnosis not present

## 2017-12-27 DIAGNOSIS — R0789 Other chest pain: Secondary | ICD-10-CM | POA: Diagnosis not present

## 2017-12-27 LAB — CBC WITH DIFFERENTIAL/PLATELET
Basophils Absolute: 0 10*3/uL (ref 0.0–0.1)
Basophils Relative: 0 %
Eosinophils Absolute: 0 10*3/uL (ref 0.0–0.7)
Eosinophils Relative: 0 %
HCT: 29.3 % — ABNORMAL LOW (ref 39.0–52.0)
Hemoglobin: 9.3 g/dL — ABNORMAL LOW (ref 13.0–17.0)
Lymphocytes Relative: 14 %
Lymphs Abs: 0.5 10*3/uL — ABNORMAL LOW (ref 0.7–4.0)
MCH: 29.8 pg (ref 26.0–34.0)
MCHC: 31.7 g/dL (ref 30.0–36.0)
MCV: 93.9 fL (ref 78.0–100.0)
Monocytes Absolute: 0.2 10*3/uL (ref 0.1–1.0)
Monocytes Relative: 5 %
Neutro Abs: 2.8 10*3/uL (ref 1.7–7.7)
Neutrophils Relative %: 81 %
Platelets: 93 10*3/uL — ABNORMAL LOW (ref 150–400)
RBC: 3.12 MIL/uL — ABNORMAL LOW (ref 4.22–5.81)
RDW: 14.2 % (ref 11.5–15.5)
WBC: 3.5 10*3/uL — ABNORMAL LOW (ref 4.0–10.5)

## 2017-12-27 LAB — COMPREHENSIVE METABOLIC PANEL
ALT: 21 U/L (ref 17–63)
AST: 36 U/L (ref 15–41)
Albumin: 3.2 g/dL — ABNORMAL LOW (ref 3.5–5.0)
Alkaline Phosphatase: 65 U/L (ref 38–126)
Anion gap: 12 (ref 5–15)
BUN: 9 mg/dL (ref 6–20)
CO2: 16 mmol/L — ABNORMAL LOW (ref 22–32)
Calcium: 8.7 mg/dL — ABNORMAL LOW (ref 8.9–10.3)
Chloride: 110 mmol/L (ref 101–111)
Creatinine, Ser: 1.16 mg/dL (ref 0.61–1.24)
GFR calc Af Amer: >60 mL/min (ref >60)
GFR calc non Af Amer: >60 mL/min (ref >60)
Glucose, Bld: 190 mg/dL — ABNORMAL HIGH (ref 65–99)
Potassium: 3.9 mmol/L (ref 3.5–5.1)
Sodium: 138 mmol/L (ref 135–145)
Total Bilirubin: 1.2 mg/dL (ref 0.3–1.2)
Total Protein: 6.6 g/dL (ref 6.5–8.1)

## 2017-12-27 LAB — I-STAT TROPONIN, ED: Troponin i, poc: 0 ng/mL (ref 0.00–0.08)

## 2017-12-27 LAB — LIPASE, BLOOD: Lipase: 49 U/L (ref 11–51)

## 2017-12-27 MED ORDER — MORPHINE SULFATE (PF) 4 MG/ML IV SOLN
4.0000 mg | Freq: Once | INTRAVENOUS | Status: AC
  Administered 2017-12-27: 4 mg via INTRAVENOUS
  Filled 2017-12-27: qty 1

## 2017-12-27 MED ORDER — SODIUM CHLORIDE 0.9 % IV BOLUS
1000.0000 mL | Freq: Once | INTRAVENOUS | Status: AC
  Administered 2017-12-27: 1000 mL via INTRAVENOUS

## 2017-12-27 MED ORDER — OXYCODONE HCL 5 MG PO TABS: 5.0000 mg | ORAL_TABLET | ORAL | 0 refills | Status: DC | PRN

## 2017-12-27 MED ORDER — ONDANSETRON HCL 4 MG PO TABS: 4.0000 mg | ORAL_TABLET | Freq: Four times a day (QID) | ORAL | 0 refills | Status: DC | PRN

## 2017-12-27 MED ORDER — OXYCODONE-ACETAMINOPHEN 5-325 MG PO TABS: 1.0000 | ORAL_TABLET | ORAL | 0 refills | Status: DC | PRN

## 2017-12-27 MED ORDER — ONDANSETRON HCL 4 MG/2ML IJ SOLN
4.0000 mg | Freq: Once | INTRAMUSCULAR | Status: AC
  Administered 2017-12-27: 4 mg via INTRAVENOUS
  Filled 2017-12-27: qty 2

## 2017-12-27 MED ORDER — IOHEXOL 300 MG/ML  SOLN
100.0000 mL | Freq: Once | INTRAMUSCULAR | Status: AC | PRN
  Administered 2017-12-27: 100 mL via INTRAVENOUS

## 2017-12-27 NOTE — Patient Outreach (Signed)
[place] [facility]) Care Management  12/27/2017  [name] [birth year] [id number]   BSW intended to make follow up call today but patient is currently in ED.  BSW will follow up with patient next week.  [name], BSW Social Worker [phone]

## 2017-12-27 NOTE — ED Provider Notes (Signed)
[facility] Provider Note   CSN: [id number] Arrival date & time: 12/27/17  0231     History   Chief Complaint No chief complaint on file.   HPI [name] is a 72 y.o. male.  The history is provided by the patient.  He has history of hypertension, diabetes, hyperlipidemia, hepatocellular cancer and comes in with onset 9:30 PM of severe pain in the epigastric area radiating to the right upper quadrant.  Pain is sharp and he rated it at 10/10.  Nothing made it better, nothing made it worse.  There is associated nausea but no vomiting.  He had dyspnea.  He denies diaphoresis.  He has never had pain like this before.  He tried to go to sleep, but was unable to sleep so he called for EMS to bring him to the hospital.  Of note, pain did not radiate to the back or to the shoulder.  He denies tobacco and ethanol use and denies illicit drug use.  Past Medical History:  Diagnosis Date  . Cancer [facility])    hepatocellular cancer   . Diabetes mellitus without complication ([place])   . Hepatitis   . Hyperlipidemia   . Hypertension   . Narcolepsy    per office visit note of 08/2011   . Sleep apnea     Patient Active Problem List   Diagnosis Date Noted  . Noncompliance with CPAP treatment 08/14/2017  . Excessive daytime sleepiness 04/09/2017  . Snoring 04/09/2017  . Memory impairment of gradual onset 04/09/2017  . Aortic atherosclerosis ([place]) 11/27/2016  . 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 Altered mental status 03/05/2013  . Respiratory failure, post-operative ([place]) 03/02/2013  . Hepatitis B 02/24/2013  . Cancer, hepatocellular ([place]) 02/10/2013  . Liver tumor-bleeding 01/15/2013  . Epidermal cyst 06/18/2012  . OSA (obstructive sleep apnea) 03/13/2012  . Abnormal leg movement 12/12/2011  . Blood pressure elevated 09/06/2011  . Diabetes mellitus ([place]) 09/06/2011  . 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 BPH (benign  prostatic hyperplasia) 09/06/2011  . Diverticula of colon 09/06/2011    Past Surgical History:  Procedure Laterality Date  . COLONOSCOPY WITH PROPOFOL N/A 08/27/2017   Procedure: COLONOSCOPY WITH PROPOFOL;  Surgeon: Clarene Essex, MD;  Location: [facility] ENDOSCOPY;  Service: Endoscopy;  Laterality: N/A;  . HOT HEMOSTASIS N/A 08/27/2017   Procedure: HOT HEMOSTASIS (ARGON PLASMA COAGULATION/BICAP);  Surgeon: Clarene Essex, MD;  Location: [facility];  Service: Endoscopy;  Laterality: N/A;  . LAPAROSCOPY  03/02/2013   Procedure: LAPAROSCOPY DIAGNOSTIC;  Surgeon: [name], MD;  Location: [facility] ORS;  Service: General;;  . LIVER ULTRASOUND  03/02/2013   Procedure: LIVER ULTRASOUND;  Surgeon: [name], MD;  Location: [facility] ORS;  Service: General;;  . OPEN PARTIAL HEPATECTOMY   03/02/2013   Procedure: OPEN PARTIAL HEPATECTOMY [83];  Surgeon: [name], MD;  Location: [facility] ORS;  Service: General;;  DX LAPAROSCOPY, INTRAOPERATIVE LIVER ULTRASOUND, OPEN PARTIAL HEPATECTOMY  . pinched nerve in back          Home Medications    Prior to Admission medications   Medication Sig Start Date End Date Taking? Authorizing Provider  ACCU-CHEK SOFTCLIX LANCETS lancets Once per day testing. 08/05/17   Wendie Agreste, MD  alfuzosin (UROXATRAL) 10 MG 24 hr tablet Take 1 tablet (10 mg total) by mouth at bedtime. 12/11/17   Wendie Agreste, MD  amphetamine-dextroamphetamine (ADDERALL XR) 30 MG 24 hr capsule Take 1 capsule (30 mg total) by  mouth every morning. 10/30/17   Ward Givens, NP  blood glucose meter kit and supplies Dispense based on patient and insurance preference. Use up to four times daily as directed. (FOR ICD-9 250.00, 250.01). 04/25/17   Wendie Agreste, MD  donepezil (ARICEPT) 10 MG tablet Take 1 tablet (10 mg total) by mouth at bedtime. 11/05/17   Dohmeier, Asencion Partridge, MD  ferrous sulfate (KP FERROUS SULFATE) 325 (65 FE) MG tablet Take 325 mg by mouth 3 (three) times daily with meals.     [provider]  glipiZIDE (GLUCOTROL) 5 MG tablet TAKE 1 TABLET BY MOUTH&amp;nbsp;&amp;nbsp;TWICE A DAY BEFORE MEALS 11/07/17   Wendie Agreste, MD  glucose blood (ACCU-CHEK AVIVA PLUS) test strip 1 each by Other route as needed for other. Use as instructed - once per day testing for now. 08/05/17   Wendie Agreste, MD  losartan (COZAAR) 25 MG tablet Take 1 tablet (25 mg total) by mouth daily. 11/07/17   Wendie Agreste, MD  metFORMIN (GLUCOPHAGE) 1000 MG tablet Take 1 tablet (1,000 mg total) by mouth 2 (two) times daily with a meal. 11/07/17   Wendie Agreste, MD  metoprolol succinate (TOPROL-XL) 50 MG 24 hr tablet Take 1 tablet (50 mg total) by mouth daily. Take with or immediately following a meal. 11/07/17   Wendie Agreste, MD  pravastatin (PRAVACHOL) 20 MG tablet Take 1 tablet (20 mg total) by mouth at bedtime. 11/07/17   Wendie Agreste, MD  spironolactone (ALDACTONE) 25 MG tablet Take 1 tablet (25 mg total) by mouth 2 (two) times daily. 11/07/17   Wendie Agreste, MD    Family History Family History  Problem Relation Age of Onset  . Dementia Mother   . Cancer Father   . Diabetes Brother   . Hypertension Brother   . Cancer Brother     Social History Social History   Tobacco Use  . Smoking status: Former Smoker    Years: 1.00  . Smokeless tobacco: Never Used  Substance Use Topics  . Alcohol use: No  . Drug use: No     Allergies   Ace inhibitors   Review of Systems Review of Systems  All other systems reviewed and are negative.    Physical Exam Updated Vital Signs BP 127/67 (BP Location: Right Arm)   Pulse 63   Temp 98.4 F (36.9 C) (Oral)   Resp 16   SpO2 100%   Physical Exam  Nursing note and vitals reviewed.  72 year old male, resting comfortably and in no acute distress. Vital signs are normal. Oxygen saturation is 100%, which is normal. Head is normocephalic and atraumatic. PERRLA, EOMI. Oropharynx is clear. Neck is nontender and supple  without adenopathy or JVD. Back is nontender and there is no CVA tenderness. Lungs are clear without rales, wheezes, or rhonchi. Chest is nontender. Heart has regular rate and rhythm without murmur. Abdomen is soft, flat, with marked tenderness in the epigastric area and right upper quadrant.  There is mild to moderate tenderness through the rest of the abdomen.  There is no rebound or guarding.  There are no masses or hepatosplenomegaly and peristalsis is normoactive. Extremities have no cyanosis or edema, full range of motion is present. Skin is warm and dry without rash. Neurologic: Mental status is normal, cranial nerves are intact, there are no motor or sensory deficits.  ED Treatments / Results  Labs (all labs ordered are listed, but only abnormal results are displayed) Labs  Reviewed - No data to display  EKG EKG Interpretation  Date/Time:  Friday Dec 27 2017 02:46:04 EDT Ventricular Rate:  63 PR Interval:    QRS Duration: 92 QT Interval:  453 QTC Calculation: 464 R Axis:   27 Text Interpretation:  Sinus rhythm Normal ECG When compared with ECG of 03/09/2013, Premature ventricular complexes are no longer present Reconfirmed by Delora Fuel ([id number]) on 12/27/2017 2:57:17 AM   Radiology Ct Abdomen Pelvis W Contrast  Result Date: 12/27/2017 CLINICAL DATA:  72 year old male with right upper quadrant abdominal pain. History of cirrhosis and hepatocellular carcinoma status post partial liver resection in 2014. EXAM: CT ABDOMEN AND PELVIS WITH CONTRAST TECHNIQUE: Multidetector CT imaging of the abdomen and pelvis was performed using the standard protocol following bolus administration of intravenous contrast. CONTRAST:  137m OMNIPAQUE IOHEXOL 300 MG/ML  SOLN COMPARISON:  Abdominal CT dated 10/15/2017 FINDINGS: Lower chest: Right lung base linear atelectasis/scarring. There is borderline cardiomegaly. Coronary vascular calcification noted. No intra-abdominal free air. Hepatobiliary:  Cirrhosis. Postsurgical changes of left lobe of the liver. No focal liver lesion identified. There is trace perihepatic free fluid. The gallbladder contains stones. No evidence of acute cholecystitis by CT. Pancreas: Unremarkable. No pancreatic ductal dilatation or surrounding inflammatory changes. Spleen: Mildly enlarged spleen measures up to 15 cm in craniocaudal length appears slightly larger compared to the prior CT. Adrenals/Urinary Tract: The adrenal glands are unremarkable. Subcentimeter right renal hypodense lesion is too small to characterize. The kidneys, visualized ureters, and urinary bladder appear unremarkable. There is symmetric enhancement and excretion of contrast by both kidneys. Stomach/Bowel: There is sigmoid diverticulosis without active inflammatory changes. There is mild thickening of the ascending colon with mild adjacent haziness. This finding may be related to underdistention or represent hepatic colopathy although colitis is not excluded. Clinical correlation is recommended. There is no bowel obstruction. The appendix is normal. Vascular/Lymphatic: Moderate aortoiliac atherosclerotic disease. Large esophageal and gastric varices noted. Small collateral vessels noted in the anterior and upper abdomen. No portal venous gas. There is no adenopathy. Reproductive: The prostate and seminal vesicles are grossly unremarkable. Other: There is diffuse mesenteric stranding and edema, progressed compared to prior CT. Musculoskeletal: No acute or significant osseous findings. IMPRESSION: 1. Cirrhosis with findings of portal hypertension including splenomegaly and upper abdominal varices. There is mild diffuse mesenteric edema and trace perihepatic fluid, new since the prior CT. 2. Cholelithiasis without evidence of acute cholecystitis by CT. 3. Thickened appearance of the ascending colon which may be related to underdistention and hepatic colopathy although colitis is not excluded. Clinical correlation  is recommended. No bowel obstruction. Normal appendix. 4. Colonic diverticulosis. 5.  Aortic Atherosclerosis (ICD10-I70.0). Electronically Signed   By: AAnner CreteM.D.   On: 12/27/2017 05:12    Procedures Procedures  Medications Ordered in ED Medications  sodium chloride 0.9 % bolus 1,000 mL (0 mLs Intravenous Stopped 12/27/17 0619)  morphine 4 MG/ML injection 4 mg (4 mg Intravenous Given 12/27/17 0349)  ondansetron (ZOFRAN) injection 4 mg (4 mg Intravenous Given 12/27/17 0349)  iohexol (OMNIPAQUE) 300 MG/ML solution 100 mL (100 mLs Intravenous Contrast Given 12/27/17 0442)     Initial Impression / Assessment and Plan / ED Course  I have reviewed the triage vital signs and the nursing notes.  Pertinent labs & imaging results that were available during my care of the patient were reviewed by me and considered in my medical decision making (see chart for details).  Epigastric pain with epigastric and right upper quadrant tenderness.  Strongly suggestive of biliary tract disease or peptic ulcer disease.  Consider pancreatitis, diverticulitis, coronary artery disease.  ECG shows no acute changes.  Old records are reviewed, and CT scan of abdomen and pelvis in January 2018 noted presence of multiple gallstones.  This makes cholecystitis much more likely.  Because of generalized tenderness, will send for CT of abdomen and pelvis.  He is given IV fluids, morphine, ondansetron.  Of note, he was diagnosed with hepatocellular carcinoma and had partial resection of liver in 2013, follow-ups have shown no evidence of disease recurrence.  He feels much better after above-noted treatment.  CT confirms presence of gallstones, but shows no other acute process.  Labs were significant for CO2 of 16, but with normal anion gap.  Pancytopenia is present and unchanged from baseline.  Exam is repeated and abdomen is nontender.  He is felt to be safe for discharge at this point.  He is discharged with prescriptions  for ondansetron and small number of oxycodone tablets for pain.  He is referred to [facility] surgery for evaluation for possible cholecystectomy.  Return precautions discussed.  Final Clinical Impressions(s) / ED Diagnoses   Final diagnoses:  Biliary colic  Pancytopenia [facility])    ED Discharge Orders    None       Delora Fuel, MD 29/79/89 [phone]

## 2017-12-27 NOTE — ED Triage Notes (Signed)
Pt arrived via [facility]; per EMS, pt from home w/ c/o CP at 930p worsening within last hr; Pt denies c/o N/V/D; Hx of liver CA and cirrhosis ; Pt rec'd ASA 324mg , 0.4mg  nirto; Pt is HOH and has 20L hand

## 2017-12-27 NOTE — Discharge Instructions (Signed)
Stay on a low fat diet.  Return if pain is not being adequately controlled at home, you start running a high fever, or if you start vomiting.

## 2017-12-27 NOTE — Patient Outreach (Signed)
[place] [facility]) Care Management  12/27/2017  CRISTIN SZATKOWSKI [birth year] [id number]   [place] Monthly Outreach  Referral Date:05/23/2017 Referral Source:Primary MD Reason for Referral:Medication Assistance Insurance:[employer] Medicare   Outreach Attempt:  Outreach attempt #2 to patient for monthly follow up and emergency room visit follow up.  Patient answered and stated he was at physicians office at this time.  Requested telephone call back.  Plan: RN Health Coach will make another outreach attempt to patient within 3-4 business days if no return call back from patient.  [place] Coach [phone] [email or web address]

## 2017-12-27 NOTE — ED Notes (Signed)
Patient transported to CT 

## 2017-12-30 ENCOUNTER — Other Ambulatory Visit: Payer: Self-pay | Admitting: *Deleted

## 2017-12-30 ENCOUNTER — Encounter: Payer: Self-pay | Admitting: *Deleted

## 2017-12-30 NOTE — Patient Outreach (Signed)
[place] [facility]) Care Management  12/30/2017  EMERICK WEATHERLY 1945/12/10 [id number]   [place] Monthly Outreach  Referral Date:05/23/2017 Referral Source:Primary MD Reason for Referral:Medication Assistance Insurance:[employer] Medicare   Outreach Attempt:  Received telephone call back from patient. HIPAA verified with patient.  Patient verbalizing he has not felt well and had an emergency room visit the end of last week.  States he has been having abdominal pain and it got really severe Thursday night.  Patient states pain is some better now and he has been using the pain and nausea medicine prescribed by the emergency room physician only when needed.  Reports he has an appointment with his Gastroenterologist tomorrow and will go from there.  Patient states he is able to eat small amounts of food to take his medications.  Denies any hypoglycemic events, but does report not monitoring his blood sugars like he normally did due to not feeling well.  Unable to verbalize latest CBG ranges.  Patient does state he continues to take his glipizide twice a day (morning and evening with dinner).  Denies any difficulties with medication management at this time.  Appointments:  Patient states he has an appointment with Dr. Watt Climes, Gastroenterologist on 12/31/2017 and will go from there concerning seeing surgeon for his gall bladder.  States he last saw his primary care provider on 11/21/2017 and has scheduled appointment on 02/11/2018.  Patient verbalizing he will request sooner medical appointment after seeing his Gastroenterologist tomorrow.  Patient had a missed appointment with his primary care provider on 12/19/2017, encouraged to call and reschedule the missed appointment as soon as possible.  Plan: RN Health Coach encouraged patient to work with [place] to place [facility] application. RN Health Coach will make next monthly outreach to patient in the month of  June.  [place] [phone] [email or web address]

## 2017-12-30 NOTE — Patient Outreach (Signed)
[place] [facility]) Care Management  12/30/2017  [name] [birth year] [id number]   [place] Monthly Outreach  Referral Date:05/23/2017 Referral Source:Primary MD Reason for Referral:Medication Assistance Insurance:[employer] Medicare   Outreach Attempt:  Outreach attempt #3 to patient for monthly follow up. No answer and unable to leave voicemail message due to voicemail not set up.  Plan:  RN Health Coach will close case if no return call from patient within 10 day time period from Unsuccessful Letter being mailed to patient.  [place] [phone] [name]

## 2017-12-31 DIAGNOSIS — R1011 Right upper quadrant pain: Secondary | ICD-10-CM | POA: Diagnosis not present

## 2017-12-31 DIAGNOSIS — D508 Other iron deficiency anemias: Secondary | ICD-10-CM | POA: Diagnosis not present

## 2017-12-31 DIAGNOSIS — R932 Abnormal findings on diagnostic imaging of liver and biliary tract: Secondary | ICD-10-CM | POA: Diagnosis not present

## 2018-01-01 ENCOUNTER — Other Ambulatory Visit: Payer: Self-pay

## 2018-01-01 NOTE — Patient Outreach (Signed)
[place] [facility]) Care Management  01/01/2018  [name] Jul 22, 1946 [id number]   BSW made final follow up call to patient regarding social work referral.  BSW made home visit and assisted patient with completing Medicaid application and [place] application.  BSW made multiple follow up call to ensure submission of application but patient reported he has not submitted them.  Per patient during last two conversations, he received a mail from "the government" reporting that he was denied for financial assistance.  He expressed that he does not feel as if he will get approved for Medicaid.  BSW informed patient that social work discipline will close at this time but he is welcome to call if other needs arise.    [name], BSW Social Worker [phone]

## 2018-01-06 ENCOUNTER — Other Ambulatory Visit: Payer: Self-pay | Admitting: General Surgery

## 2018-01-06 DIAGNOSIS — K8010 Calculus of gallbladder with chronic cholecystitis without obstruction: Secondary | ICD-10-CM | POA: Diagnosis not present

## 2018-01-09 DIAGNOSIS — G4733 Obstructive sleep apnea (adult) (pediatric): Secondary | ICD-10-CM | POA: Diagnosis not present

## 2018-01-14 ENCOUNTER — Inpatient Hospital Stay: Payer: Medicare Other | Attending: Oncology

## 2018-01-14 ENCOUNTER — Inpatient Hospital Stay: Payer: Medicare Other

## 2018-01-14 DIAGNOSIS — H2513 Age-related nuclear cataract, bilateral: Secondary | ICD-10-CM | POA: Diagnosis not present

## 2018-01-14 DIAGNOSIS — D500 Iron deficiency anemia secondary to blood loss (chronic): Secondary | ICD-10-CM | POA: Diagnosis not present

## 2018-01-14 DIAGNOSIS — C220 Liver cell carcinoma: Secondary | ICD-10-CM | POA: Diagnosis not present

## 2018-01-14 DIAGNOSIS — E119 Type 2 diabetes mellitus without complications: Secondary | ICD-10-CM | POA: Diagnosis not present

## 2018-01-14 DIAGNOSIS — H25013 Cortical age-related cataract, bilateral: Secondary | ICD-10-CM | POA: Diagnosis not present

## 2018-01-14 DIAGNOSIS — H40013 Open angle with borderline findings, low risk, bilateral: Secondary | ICD-10-CM | POA: Diagnosis not present

## 2018-01-14 LAB — CBC WITH DIFFERENTIAL (CANCER CENTER ONLY)
Basophils Absolute: 0 10*3/uL (ref 0.0–0.1)
Basophils Relative: 0 %
Eosinophils Absolute: 0 10*3/uL (ref 0.0–0.5)
Eosinophils Relative: 1 %
HCT: 32.9 % — ABNORMAL LOW (ref 38.4–49.9)
Hemoglobin: 10.6 g/dL — ABNORMAL LOW (ref 13.0–17.1)
Lymphocytes Relative: 21 %
Lymphs Abs: 0.6 10*3/uL — ABNORMAL LOW (ref 0.9–3.3)
MCH: 31.1 pg (ref 27.2–33.4)
MCHC: 32.3 g/dL (ref 32.0–36.0)
MCV: 96.5 fL (ref 79.3–98.0)
Monocytes Absolute: 0.3 10*3/uL (ref 0.1–0.9)
Monocytes Relative: 9 %
Neutro Abs: 1.9 10*3/uL (ref 1.5–6.5)
Neutrophils Relative %: 69 %
Platelet Count: 87 10*3/uL — ABNORMAL LOW (ref 140–400)
RBC: 3.41 MIL/uL — ABNORMAL LOW (ref 4.20–5.82)
RDW: 18.1 % — ABNORMAL HIGH (ref 11.0–14.6)
WBC Count: 2.8 10*3/uL — ABNORMAL LOW (ref 4.0–10.3)

## 2018-01-14 LAB — HM DIABETES EYE EXAM

## 2018-01-15 ENCOUNTER — Telehealth: Payer: Self-pay | Admitting: Emergency Medicine

## 2018-01-15 LAB — AFP TUMOR MARKER: AFP, Serum, Tumor Marker: 3.1 ng/mL (ref 0.0–8.3)

## 2018-01-15 NOTE — Pre-Procedure Instructions (Signed)
[name]  01/15/2018      [name], [place] - [address] [name] [address] Phone: [phone] Fax: [phone]    Your procedure is scheduled on January 28, 2018.  Report to [name] Admitting at 1030 am.  Call this number if you have problems the morning of surgery:  [phone]   Remember:  No food after midnight.  You may drink clear liquids until 930 AM .  Clear liquids allowed are: Pre-surgery Ensure drink,  Water, Juice (non-citric and without pulp), Carbonated beverages, Clear Tea, Black Coffee only, Plain Jell-O only, Gatorade and Plain Popsicles only  Please complete your PRE-SURGERY ENSURE that was given to before you leave your house the morning of surgery.  Please, if able, drink it in one setting. DO NOT SIP-finish by 930 AM.  Continue all medications as directed by your physician except follow these medication instructions before surgery below    Take these medicines the morning of surgery with A SIP OF WATER  Metoprolol succinate (Toprol XL)   7 days prior to surgery STOP taking any Aspirin (unless otherwise instructed by your surgeon), Aleve, Naproxen, Ibuprofen, Motrin, Advil, Goody's, BC's, all herbal medications, fish oil, and all vitamins   WHAT DO I DO ABOUT MY DIABETES MEDICATION?  [name] Kitchen Do not take oral diabetes medicines (pills) the morning of surgery Metformin (glucophage), glipizide (glucotrol).  . The day of surgery, do not take other diabetes injectables, including Byetta (exenatide), Bydureon (exenatide ER), Victoza (liraglutide), or Trulicity (dulaglutide).  . If your CBG is greater than 220 mg/dL, you may take  of your sliding scale (correction) dose of insulin.  Reviewed and Endorsed by [name] Patient Education Committee, August 2015   How to Manage Your Diabetes Before and After Surgery  Why is it important to control my blood sugar before and  after surgery? . Improving blood sugar levels before and after surgery helps healing and can limit problems. . A way of improving blood sugar control is eating a healthy diet by: o  Eating less sugar and carbohydrates o  Increasing activity/exercise o  Talking with your doctor about reaching your blood sugar goals . High blood sugars (greater than 180 mg/dL) can raise your risk of infections and slow your recovery, so you will need to focus on controlling your diabetes during the weeks before surgery. . Make sure that the doctor who takes care of your diabetes knows about your planned surgery including the date and location.  How do I manage my blood sugar before surgery? . Check your blood sugar at least 4 times a day, starting 2 days before surgery, to make sure that the level is not too high or low. o Check your blood sugar the morning of your surgery when you wake up and every 2 hours until you get to the Short Stay unit. . If your blood sugar is less than 70 mg/dL, you will need to treat for low blood sugar: o Do not take insulin. o Treat a low blood sugar (less than 70 mg/dL) with  cup of clear juice (cranberry or apple), 4 glucose tablets, OR glucose gel. Recheck blood sugar in 15 minutes after treatment (to make sure it is greater than 70 mg/dL). If your blood sugar is not greater than 70 mg/dL on recheck, call [phone] o  for further instructions. . Report your blood sugar to the short stay nurse when you get  to Short Stay.  . If you are admitted to the hospital after surgery: o Your blood sugar will be checked by the staff and you will probably be given insulin after surgery (instead of oral diabetes medicines) to make sure you have good blood sugar levels. o The goal for blood sugar control after surgery is 80-180 mg/dL.   Do not wear jewelry, make-up or nail polish.  Do not wear lotions, powders, or perfumes, or deodorant.  Do not shave 48 hours prior to surgery.  Men may  shave face and neck.  Do not bring valuables to the hospital.  [facility] is not responsible for any belongings or valuables.  Contacts, dentures or bridgework may not be worn into surgery.  Leave your suitcase in the car.  After surgery it may be brought to your room.  For patients admitted to the hospital, discharge time will be determined by your treatment team.  Patients discharged the day of surgery will not be allowed to drive home.    [name]- Preparing For Surgery  Before surgery, you can play an important role. Because skin is not sterile, your skin needs to be as free of germs as possible. You can reduce the number of germs on your skin by washing with CHG (chlorahexidine gluconate) Soap before surgery.  CHG is an antiseptic cleaner which kills germs and bonds with the skin to continue killing germs even after washing.    Oral Hygiene is also important to reduce your risk of infection.  Remember - BRUSH YOUR TEETH THE MORNING OF SURGERY WITH YOUR REGULAR TOOTHPASTE  Please do not use if you have an allergy to CHG or antibacterial soaps. If your skin becomes reddened/irritated stop using the CHG.  Do not shave (including legs and underarms) for at least 48 hours prior to first CHG shower. It is OK to shave your face.  Please follow these instructions carefully.   1. Shower the NIGHT BEFORE SURGERY and the MORNING OF SURGERY with CHG.   2. If you chose to wash your hair, wash your hair first as usual with your normal shampoo.  3. After you shampoo, rinse your hair and body thoroughly to remove the shampoo.  4. Use CHG as you would any other liquid soap. You can apply CHG directly to the skin and wash gently with a scrungie or a clean washcloth.   5. Apply the CHG Soap to your body ONLY FROM THE NECK DOWN.  Do not use on open wounds or open sores. Avoid contact with your eyes, ears, mouth and genitals (private parts). Wash Face and genitals (private parts)  with your normal  soap.  6. Wash thoroughly, paying special attention to the area where your surgery will be performed.  7. Thoroughly rinse your body with warm water from the neck down.  8. DO NOT shower/wash with your normal soap after using and rinsing off the CHG Soap.  9. Pat yourself dry with a CLEAN TOWEL.  10. Wear CLEAN PAJAMAS to bed the night before surgery, wear comfortable clothes the morning of surgery  11. Place CLEAN SHEETS on your bed the night of your first shower and DO NOT SLEEP WITH PETS.  Day of Surgery:  Do not apply any deodorants/lotions.  Please wear clean clothes to the hospital/surgery center.   Remember to brush your teeth WITH YOUR REGULAR TOOTHPASTE.   Please read over the following fact sheets that you were given. Pain Booklet, Coughing and Deep Breathing and Surgical  Site Infection Prevention

## 2018-01-15 NOTE — Telephone Encounter (Addendum)
Pt verbalized understanding of this   ----- Message from Ladell Pier, MD sent at 01/14/2018  3:10 PM EDT ----- Please call patient, the hemoglobin is stable, follow-up as scheduled

## 2018-01-16 ENCOUNTER — Encounter (HOSPITAL_COMMUNITY)
Admission: RE | Admit: 2018-01-16 | Discharge: 2018-01-16 | Disposition: A | Discharge status: Home / Self Care | Payer: Medicare Other | Source: Ambulatory Visit | Attending: General Surgery | Admitting: General Surgery

## 2018-01-16 ENCOUNTER — Encounter (HOSPITAL_COMMUNITY): Payer: Self-pay

## 2018-01-16 DIAGNOSIS — K8010 Calculus of gallbladder with chronic cholecystitis without obstruction: Secondary | ICD-10-CM | POA: Insufficient documentation

## 2018-01-16 DIAGNOSIS — E119 Type 2 diabetes mellitus without complications: Secondary | ICD-10-CM | POA: Diagnosis not present

## 2018-01-16 DIAGNOSIS — Z01812 Encounter for preprocedural laboratory examination: Secondary | ICD-10-CM | POA: Insufficient documentation

## 2018-01-16 DIAGNOSIS — Z8505 Personal history of malignant neoplasm of liver: Secondary | ICD-10-CM | POA: Insufficient documentation

## 2018-01-16 DIAGNOSIS — D61818 Other pancytopenia: Secondary | ICD-10-CM | POA: Diagnosis not present

## 2018-01-16 DIAGNOSIS — I10 Essential (primary) hypertension: Secondary | ICD-10-CM | POA: Diagnosis not present

## 2018-01-16 HISTORY — DX: Dyspnea, unspecified: R06.00

## 2018-01-16 LAB — COMPREHENSIVE METABOLIC PANEL
ALT: 24 U/L (ref 17–63)
AST: 39 U/L (ref 15–41)
Albumin: 3.4 g/dL — ABNORMAL LOW (ref 3.5–5.0)
Alkaline Phosphatase: 63 U/L (ref 38–126)
Anion gap: 11 (ref 5–15)
BUN: 8 mg/dL (ref 6–20)
CO2: 19 mmol/L — ABNORMAL LOW (ref 22–32)
Calcium: 9 mg/dL (ref 8.9–10.3)
Chloride: 108 mmol/L (ref 101–111)
Creatinine, Ser: 1.35 mg/dL — ABNORMAL HIGH (ref 0.61–1.24)
GFR calc Af Amer: 59 mL/min — ABNORMAL LOW (ref >60)
GFR calc non Af Amer: 50 mL/min — ABNORMAL LOW (ref >60)
Glucose, Bld: 201 mg/dL — ABNORMAL HIGH (ref 65–99)
Potassium: 3.7 mmol/L (ref 3.5–5.1)
Sodium: 138 mmol/L (ref 135–145)
Total Bilirubin: 1.2 mg/dL (ref 0.3–1.2)
Total Protein: 7.2 g/dL (ref 6.5–8.1)

## 2018-01-16 LAB — CBC WITH DIFFERENTIAL/PLATELET
Abs Immature Granulocytes: 0 10*3/uL (ref 0.0–0.1)
Basophils Absolute: 0 10*3/uL (ref 0.0–0.1)
Basophils Relative: 0 %
Eosinophils Absolute: 0 10*3/uL (ref 0.0–0.7)
Eosinophils Relative: 2 %
HCT: 32.8 % — ABNORMAL LOW (ref 39.0–52.0)
Hemoglobin: 10.3 g/dL — ABNORMAL LOW (ref 13.0–17.0)
Immature Granulocytes: 0 %
Lymphocytes Relative: 23 %
Lymphs Abs: 0.6 10*3/uL — ABNORMAL LOW (ref 0.7–4.0)
MCH: 30.5 pg (ref 26.0–34.0)
MCHC: 31.4 g/dL (ref 30.0–36.0)
MCV: 97 fL (ref 78.0–100.0)
Monocytes Absolute: 0.2 10*3/uL (ref 0.1–1.0)
Monocytes Relative: 9 %
Neutro Abs: 1.6 10*3/uL — ABNORMAL LOW (ref 1.7–7.7)
Neutrophils Relative %: 66 %
Platelets: 94 10*3/uL — ABNORMAL LOW (ref 150–400)
RBC: 3.38 MIL/uL — ABNORMAL LOW (ref 4.22–5.81)
RDW: 15.9 % — ABNORMAL HIGH (ref 11.5–15.5)
WBC: 2.5 10*3/uL — ABNORMAL LOW (ref 4.0–10.5)

## 2018-01-16 LAB — HEMOGLOBIN A1C
Hgb A1c MFr Bld: 5 % (ref 4.8–5.6)
Mean Plasma Glucose: 96.8 mg/dL

## 2018-01-16 LAB — PROTIME-INR
INR: 1.18
Prothrombin Time: 14.9 seconds (ref 11.4–15.2)

## 2018-01-16 LAB — GLUCOSE, CAPILLARY: Glucose-Capillary: 159 mg/dL — ABNORMAL HIGH (ref 65–99)

## 2018-01-16 MED ORDER — CHLORHEXIDINE GLUCONATE CLOTH 2 % EX PADS: 6.0000 | MEDICATED_PAD | Freq: Once | CUTANEOUS | Status: DC

## 2018-01-16 NOTE — Progress Notes (Signed)
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 Marlowe Aschoff note 01/06/18 (found in media tab) indicates she is aware of increased bleeding risk.   - I reviewed labs with Dr. Marcie Bal   VS: BP 123/60   Pulse 70   Temp 36.7 C   Resp 18   Ht '5\' 9"'  (1.753 m)   Wt 206 lb 1.6 oz (93.5 kg)   SpO2 100%   BMI 30.44 kg/m   PROVIDERS: - PCP is Wendie Agreste, MD   Patient Care Team: Dohmeier, Asencion Partridge, MD as Consulting Physician (Neurology) Festus Aloe, MD as Consulting Physician (Urology)  Oncologist is Betsy Coder, MD. Last office visit 10/17/17; 6 month f/u recommended  Saw cardiologist Daneen Schick, MD last year for abnormal EKG.  Nuclear stress test ordered, results below.  At last office visit 01/11/17, prn cardiology f/u recommended.    LABS:  - pancytopenia is chronic. WBC 2.5, hgb 10.3, and platelets 94 consistent with prior results.   (all labs ordered are listed, but only abnormal results are displayed)  Labs Reviewed  GLUCOSE, CAPILLARY - Abnormal; Notable for the following components:      Result Value   Glucose-Capillary 159 (*)    All other components within normal limits  CBC WITH DIFFERENTIAL/PLATELET - Abnormal; Notable for the following components:   WBC 2.5 (*)    RBC 3.38 (*)    Hemoglobin 10.3 (*)    HCT 32.8 (*)    RDW 15.9 (*)    Platelets 94 (*)    Neutro Abs 1.6 (*)    Lymphs Abs 0.6 (*)    All other components within normal limits  COMPREHENSIVE METABOLIC PANEL - Abnormal; Notable for the following components:    CO2 19 (*)    Glucose, Bld 201 (*)    Creatinine, Ser 1.35 (*)    Albumin 3.4 (*)    GFR calc non Af Amer 50 (*)    GFR calc Af Amer 59 (*)    All other components within normal limits  PROTIME-INR  HEMOGLOBIN A1C  TYPE AND SCREEN    EKG 12/27/17: sinus rhythm   CV:  Nuclear stress test 12/05/16:   Nuclear stress EF: 69%.  There was no ST segment deviation noted during stress.  The study is normal.  This is a low risk study.  The left ventricular ejection fraction is hyperdynamic (>65%).    Past Medical History:  Diagnosis Date  . Cancer [facility])    hepatocellular cancer   . Diabetes mellitus without complication ([place])   . Dyspnea   . Hepatitis   . Hyperlipidemia   . Hypertension   . Narcolepsy    per office visit note of 08/2011   . Sleep apnea    cpap    Past Surgical History:  Procedure Laterality Date  . COLONOSCOPY WITH PROPOFOL N/A 08/27/2017   Procedure: COLONOSCOPY WITH PROPOFOL;  Surgeon: Clarene Essex, MD;  Location: [facility] ENDOSCOPY;  Service: Endoscopy;  Laterality: N/A;  . HOT HEMOSTASIS N/A 08/27/2017  Procedure: HOT HEMOSTASIS (ARGON PLASMA COAGULATION/BICAP);  Surgeon: Clarene Essex, MD;  Location: [facility];  Service: Endoscopy;  Laterality: N/A;  . LAPAROSCOPY  03/02/2013   Procedure: LAPAROSCOPY DIAGNOSTIC;  Surgeon: [name], MD;  Location: [facility] ORS;  Service: General;;  . LIVER ULTRASOUND  03/02/2013   Procedure: LIVER ULTRASOUND;  Surgeon: [name], MD;  Location: [facility] ORS;  Service: General;;  . OPEN PARTIAL HEPATECTOMY   03/02/2013   Procedure: OPEN PARTIAL HEPATECTOMY [83];  Surgeon: [name], MD;  Location: [facility] ORS;  Service: General;;  DX LAPAROSCOPY, INTRAOPERATIVE LIVER ULTRASOUND, OPEN PARTIAL HEPATECTOMY  . pinched nerve in back      MEDICATIONS: . ACCU-CHEK SOFTCLIX LANCETS lancets  . alfuzosin (UROXATRAL) 10 MG 24 hr tablet  . amphetamine-dextroamphetamine (ADDERALL XR) 30 MG 24 hr capsule  . blood glucose meter kit and supplies   . donepezil (ARICEPT) 10 MG tablet  . ferrous sulfate (KP FERROUS SULFATE) 325 (65 FE) MG tablet  . glipiZIDE (GLUCOTROL) 5 MG tablet  . glucose blood (ACCU-CHEK AVIVA PLUS) test strip  . losartan (COZAAR) 25 MG tablet  . metFORMIN (GLUCOPHAGE) 1000 MG tablet  . metoprolol succinate (TOPROL-XL) 50 MG 24 hr tablet  . ondansetron (ZOFRAN) 4 MG tablet  . oxyCODONE (ROXICODONE) 5 MG immediate release tablet  . pravastatin (PRAVACHOL) 20 MG tablet  . spironolactone (ALDACTONE) 25 MG tablet   . Chlorhexidine Gluconate Cloth 2 % PADS 6 each   And  . Chlorhexidine Gluconate Cloth 2 % PADS 6 each    If no changes, I anticipate pt can proceed with surgery as scheduled.   Willeen Cass, FNP-BC [facility]/Anesthesiology Phone: [phone] 01/16/2018 3:27 PM

## 2018-01-24 ENCOUNTER — Ambulatory Visit: Payer: Self-pay | Admitting: *Deleted

## 2018-01-27 ENCOUNTER — Encounter: Payer: Self-pay | Admitting: *Deleted

## 2018-01-28 ENCOUNTER — Ambulatory Visit (HOSPITAL_COMMUNITY)
Admission: RE | Admit: 2018-01-28 | Discharge: 2018-01-30 | Disposition: A | Discharge status: Home / Self Care | Payer: Medicare Other | Source: Ambulatory Visit | Attending: General Surgery | Admitting: General Surgery

## 2018-01-28 ENCOUNTER — Encounter (HOSPITAL_COMMUNITY): Payer: Self-pay | Admitting: Certified Registered Nurse Anesthetist

## 2018-01-28 ENCOUNTER — Ambulatory Visit (HOSPITAL_COMMUNITY): Payer: Medicare Other | Admitting: Emergency Medicine

## 2018-01-28 ENCOUNTER — Encounter (HOSPITAL_COMMUNITY)
Admission: RE | Disposition: A | Discharge status: Home / Self Care | Payer: Self-pay | Source: Ambulatory Visit | Attending: General Surgery

## 2018-01-28 ENCOUNTER — Ambulatory Visit (HOSPITAL_COMMUNITY): Payer: Medicare Other | Admitting: Certified Registered Nurse Anesthetist

## 2018-01-28 DIAGNOSIS — Z9119 Patient's noncompliance with other medical treatment and regimen: Secondary | ICD-10-CM | POA: Insufficient documentation

## 2018-01-28 DIAGNOSIS — Z7984 Long term (current) use of oral hypoglycemic drugs: Secondary | ICD-10-CM | POA: Insufficient documentation

## 2018-01-28 DIAGNOSIS — Z79899 Other long term (current) drug therapy: Secondary | ICD-10-CM | POA: Diagnosis not present

## 2018-01-28 DIAGNOSIS — I10 Essential (primary) hypertension: Secondary | ICD-10-CM | POA: Diagnosis not present

## 2018-01-28 DIAGNOSIS — E119 Type 2 diabetes mellitus without complications: Secondary | ICD-10-CM | POA: Diagnosis not present

## 2018-01-28 DIAGNOSIS — I700 Atherosclerosis of aorta: Secondary | ICD-10-CM | POA: Diagnosis not present

## 2018-01-28 DIAGNOSIS — E7800 Pure hypercholesterolemia, unspecified: Secondary | ICD-10-CM | POA: Diagnosis not present

## 2018-01-28 DIAGNOSIS — N400 Enlarged prostate without lower urinary tract symptoms: Secondary | ICD-10-CM | POA: Diagnosis not present

## 2018-01-28 DIAGNOSIS — G4733 Obstructive sleep apnea (adult) (pediatric): Secondary | ICD-10-CM | POA: Diagnosis not present

## 2018-01-28 DIAGNOSIS — Z87891 Personal history of nicotine dependence: Secondary | ICD-10-CM | POA: Diagnosis not present

## 2018-01-28 DIAGNOSIS — K766 Portal hypertension: Secondary | ICD-10-CM | POA: Insufficient documentation

## 2018-01-28 DIAGNOSIS — K8010 Calculus of gallbladder with chronic cholecystitis without obstruction: Secondary | ICD-10-CM | POA: Insufficient documentation

## 2018-01-28 DIAGNOSIS — K7460 Unspecified cirrhosis of liver: Secondary | ICD-10-CM | POA: Insufficient documentation

## 2018-01-28 DIAGNOSIS — I499 Cardiac arrhythmia, unspecified: Secondary | ICD-10-CM | POA: Diagnosis present

## 2018-01-28 HISTORY — DX: Unspecified viral hepatitis B without hepatic coma: B19.10

## 2018-01-28 HISTORY — DX: Cardiac arrhythmia, unspecified: I49.9

## 2018-01-28 HISTORY — PX: CHOLECYSTECTOMY: SHX55

## 2018-01-28 HISTORY — PX: LAPAROSCOPIC CHOLECYSTECTOMY: SUR755

## 2018-01-28 LAB — GLUCOSE, CAPILLARY
Glucose-Capillary: 214 mg/dL — ABNORMAL HIGH (ref 65–99)
Glucose-Capillary: 170 mg/dL — ABNORMAL HIGH (ref 65–99)
Glucose-Capillary: 168 mg/dL — ABNORMAL HIGH (ref 65–99)

## 2018-01-28 LAB — BASIC METABOLIC PANEL
Anion gap: 10 (ref 5–15)
BUN: 9 mg/dL (ref 6–20)
CO2: 21 mmol/L — ABNORMAL LOW (ref 22–32)
Calcium: 8.6 mg/dL — ABNORMAL LOW (ref 8.9–10.3)
Chloride: 109 mmol/L (ref 101–111)
Creatinine, Ser: 1.01 mg/dL (ref 0.61–1.24)
GFR calc Af Amer: >60 mL/min (ref >60)
GFR calc non Af Amer: >60 mL/min (ref >60)
Glucose, Bld: 201 mg/dL — ABNORMAL HIGH (ref 65–99)
Potassium: 4.2 mmol/L (ref 3.5–5.1)
Sodium: 140 mmol/L (ref 135–145)

## 2018-01-28 LAB — CBC
HCT: 27.5 % — ABNORMAL LOW (ref 39.0–52.0)
Hemoglobin: 8.7 g/dL — ABNORMAL LOW (ref 13.0–17.0)
MCH: 31.2 pg (ref 26.0–34.0)
MCHC: 31.6 g/dL (ref 30.0–36.0)
MCV: 98.6 fL (ref 78.0–100.0)
Platelets: 81 10*3/uL — ABNORMAL LOW (ref 150–400)
RBC: 2.79 MIL/uL — ABNORMAL LOW (ref 4.22–5.81)
RDW: 15.6 % — ABNORMAL HIGH (ref 11.5–15.5)
WBC: 5 10*3/uL (ref 4.0–10.5)

## 2018-01-28 LAB — TROPONIN I: Troponin I: <0.03 ng/mL (ref <0.03)

## 2018-01-28 LAB — TYPE AND SCREEN
ABO/RH(D): B POS
Antibody Screen: POSITIVE

## 2018-01-28 LAB — MAGNESIUM: Magnesium: 1.4 mg/dL — ABNORMAL LOW (ref 1.7–2.4)

## 2018-01-28 SURGERY — LAPAROSCOPIC CHOLECYSTECTOMY WITH INTRAOPERATIVE CHOLANGIOGRAM
Anesthesia: General | Site: Abdomen

## 2018-01-28 MED ORDER — CEFAZOLIN SODIUM-DEXTROSE 2-4 GM/100ML-% IV SOLN
2.0000 g | INTRAVENOUS | Status: AC
  Administered 2018-01-28: 2 g via INTRAVENOUS
  Filled 2018-01-28: qty 100

## 2018-01-28 MED ORDER — MEPERIDINE HCL 50 MG/ML IJ SOLN: 6.2500 mg | INTRAMUSCULAR | Status: DC | PRN

## 2018-01-28 MED ORDER — FENTANYL CITRATE (PF) 250 MCG/5ML IJ SOLN
INTRAMUSCULAR | Status: DC | PRN
  Administered 2018-01-28: 50 ug via INTRAVENOUS
  Administered 2018-01-28: 100 ug via INTRAVENOUS

## 2018-01-28 MED ORDER — PROPOFOL 10 MG/ML IV BOLUS
INTRAVENOUS | Status: DC | PRN
  Administered 2018-01-28: 150 mg via INTRAVENOUS

## 2018-01-28 MED ORDER — ONDANSETRON HCL 4 MG/2ML IJ SOLN
INTRAMUSCULAR | Status: AC
  Filled 2018-01-28: qty 2

## 2018-01-28 MED ORDER — GABAPENTIN 100 MG PO CAPS
200.0000 mg | ORAL_CAPSULE | Freq: Two times a day (BID) | ORAL | Status: DC
  Administered 2018-01-29 – 2018-01-30 (×4): 200 mg via ORAL
  Filled 2018-01-28 (×4): qty 2

## 2018-01-28 MED ORDER — CEFAZOLIN SODIUM-DEXTROSE 2-4 GM/100ML-% IV SOLN
2.0000 g | Freq: Three times a day (TID) | INTRAVENOUS | Status: AC
  Administered 2018-01-29: 2 g via INTRAVENOUS
  Filled 2018-01-28: qty 100

## 2018-01-28 MED ORDER — HYDROMORPHONE HCL 2 MG/ML IJ SOLN
INTRAMUSCULAR | Status: AC
  Filled 2018-01-28: qty 1

## 2018-01-28 MED ORDER — SODIUM CHLORIDE 0.9 % IR SOLN
Status: DC | PRN
  Administered 2018-01-28: 1

## 2018-01-28 MED ORDER — DONEPEZIL HCL 10 MG PO TABS
10.0000 mg | ORAL_TABLET | Freq: Every day | ORAL | Status: DC
  Administered 2018-01-29 (×2): 10 mg via ORAL
  Filled 2018-01-28 (×2): qty 1

## 2018-01-28 MED ORDER — BUPIVACAINE-EPINEPHRINE (PF) 0.25% -1:200000 IJ SOLN
INTRAMUSCULAR | Status: AC
  Filled 2018-01-28: qty 30

## 2018-01-28 MED ORDER — LIDOCAINE HCL 1 % IJ SOLN
INTRAMUSCULAR | Status: AC
  Filled 2018-01-28: qty 20

## 2018-01-28 MED ORDER — 0.9 % SODIUM CHLORIDE (POUR BTL) OPTIME
TOPICAL | Status: DC | PRN
  Administered 2018-01-28: 1000 mL

## 2018-01-28 MED ORDER — PHENYLEPHRINE 40 MCG/ML (10ML) SYRINGE FOR IV PUSH (FOR BLOOD PRESSURE SUPPORT)
PREFILLED_SYRINGE | INTRAVENOUS | Status: DC | PRN
  Administered 2018-01-28 (×2): 80 ug via INTRAVENOUS

## 2018-01-28 MED ORDER — SUGAMMADEX SODIUM 200 MG/2ML IV SOLN
INTRAVENOUS | Status: AC
  Filled 2018-01-28: qty 2

## 2018-01-28 MED ORDER — SENNA 8.6 MG PO TABS
1.0000 | ORAL_TABLET | Freq: Two times a day (BID) | ORAL | Status: DC
  Administered 2018-01-29 – 2018-01-30 (×3): 8.6 mg via ORAL
  Filled 2018-01-28 (×4): qty 1

## 2018-01-28 MED ORDER — SIMETHICONE 80 MG PO CHEW: 40.0000 mg | CHEWABLE_TABLET | Freq: Four times a day (QID) | ORAL | Status: DC | PRN

## 2018-01-28 MED ORDER — PROMETHAZINE HCL 25 MG/ML IJ SOLN: 6.2500 mg | INTRAMUSCULAR | Status: DC | PRN

## 2018-01-28 MED ORDER — OXYCODONE HCL 5 MG PO TABS
5.0000 mg | ORAL_TABLET | Freq: Four times a day (QID) | ORAL | Status: DC | PRN
  Administered 2018-01-28 – 2018-01-30 (×4): 5 mg via ORAL
  Filled 2018-01-28 (×4): qty 1

## 2018-01-28 MED ORDER — LIDOCAINE 2% (20 MG/ML) 5 ML SYRINGE
INTRAMUSCULAR | Status: DC | PRN
  Administered 2018-01-28: 100 mg via INTRAVENOUS

## 2018-01-28 MED ORDER — SPIRONOLACTONE 25 MG PO TABS
25.0000 mg | ORAL_TABLET | Freq: Two times a day (BID) | ORAL | Status: DC
  Administered 2018-01-29 – 2018-01-30 (×3): 25 mg via ORAL
  Filled 2018-01-28 (×3): qty 1

## 2018-01-28 MED ORDER — DIPHENHYDRAMINE HCL 12.5 MG/5ML PO ELIX: 12.5000 mg | ORAL_SOLUTION | Freq: Four times a day (QID) | ORAL | Status: DC | PRN

## 2018-01-28 MED ORDER — PHENYLEPHRINE 40 MCG/ML (10ML) SYRINGE FOR IV PUSH (FOR BLOOD PRESSURE SUPPORT)
PREFILLED_SYRINGE | INTRAVENOUS | Status: AC
  Filled 2018-01-28: qty 10

## 2018-01-28 MED ORDER — ONDANSETRON HCL 4 MG/2ML IJ SOLN
INTRAMUSCULAR | Status: DC | PRN
  Administered 2018-01-28: 4 mg via INTRAVENOUS

## 2018-01-28 MED ORDER — INSULIN ASPART 100 UNIT/ML ~~LOC~~ SOLN
0.0000 [IU] | Freq: Three times a day (TID) | SUBCUTANEOUS | Status: DC
  Administered 2018-01-29: 3 [IU] via SUBCUTANEOUS
  Administered 2018-01-29: 2 [IU] via SUBCUTANEOUS
  Administered 2018-01-29: 3 [IU] via SUBCUTANEOUS
  Administered 2018-01-30 (×2): 2 [IU] via SUBCUTANEOUS

## 2018-01-28 MED ORDER — PROPOFOL 10 MG/ML IV BOLUS
INTRAVENOUS | Status: AC
  Filled 2018-01-28: qty 20

## 2018-01-28 MED ORDER — DIPHENHYDRAMINE HCL 50 MG/ML IJ SOLN: 12.5000 mg | Freq: Four times a day (QID) | INTRAMUSCULAR | Status: DC | PRN

## 2018-01-28 MED ORDER — ACETAMINOPHEN 650 MG RE SUPP: 650.0000 mg | Freq: Four times a day (QID) | RECTAL | Status: DC | PRN

## 2018-01-28 MED ORDER — FENTANYL CITRATE (PF) 250 MCG/5ML IJ SOLN
INTRAMUSCULAR | Status: AC
Start: 2018-01-28 — End: ?
  Filled 2018-01-28: qty 5

## 2018-01-28 MED ORDER — ROCURONIUM BROMIDE 10 MG/ML (PF) SYRINGE
PREFILLED_SYRINGE | INTRAVENOUS | Status: AC
  Filled 2018-01-28: qty 5

## 2018-01-28 MED ORDER — HYDROMORPHONE HCL 2 MG/ML IJ SOLN
0.3000 mg | INTRAMUSCULAR | Status: DC | PRN
  Administered 2018-01-28: 0.5 mg via INTRAVENOUS

## 2018-01-28 MED ORDER — EPHEDRINE SULFATE 50 MG/ML IJ SOLN
INTRAMUSCULAR | Status: DC | PRN
  Administered 2018-01-28: 10 mg via INTRAVENOUS
  Administered 2018-01-28: 5 mg via INTRAVENOUS

## 2018-01-28 MED ORDER — PRAVASTATIN SODIUM 10 MG PO TABS
20.0000 mg | ORAL_TABLET | Freq: Every day | ORAL | Status: DC
  Administered 2018-01-29 (×2): 20 mg via ORAL
  Filled 2018-01-28 (×2): qty 2

## 2018-01-28 MED ORDER — ALFUZOSIN HCL ER 10 MG PO TB24
10.0000 mg | ORAL_TABLET | Freq: Every day | ORAL | Status: DC
  Administered 2018-01-29 (×2): 10 mg via ORAL
  Filled 2018-01-28 (×3): qty 1

## 2018-01-28 MED ORDER — FENTANYL CITRATE (PF) 100 MCG/2ML IJ SOLN: 12.5000 ug | INTRAMUSCULAR | Status: DC | PRN

## 2018-01-28 MED ORDER — ROCURONIUM BROMIDE 10 MG/ML (PF) SYRINGE
PREFILLED_SYRINGE | INTRAVENOUS | Status: DC | PRN
  Administered 2018-01-28: 50 mg via INTRAVENOUS
  Administered 2018-01-28: 30 mg via INTRAVENOUS
  Administered 2018-01-28: 20 mg via INTRAVENOUS

## 2018-01-28 MED ORDER — LIDOCAINE HCL 1 % IJ SOLN
INTRAMUSCULAR | Status: DC | PRN
  Administered 2018-01-28: 16 mL

## 2018-01-28 MED ORDER — TRAMADOL HCL 50 MG PO TABS
50.0000 mg | ORAL_TABLET | Freq: Four times a day (QID) | ORAL | Status: DC | PRN
  Administered 2018-01-29: 50 mg via ORAL
  Filled 2018-01-28 (×2): qty 1

## 2018-01-28 MED ORDER — METOPROLOL TARTRATE 12.5 MG HALF TABLET
12.5000 mg | ORAL_TABLET | Freq: Two times a day (BID) | ORAL | Status: DC
  Administered 2018-01-29 – 2018-01-30 (×3): 12.5 mg via ORAL
  Filled 2018-01-28 (×4): qty 1

## 2018-01-28 MED ORDER — MIDAZOLAM HCL 2 MG/2ML IJ SOLN
INTRAMUSCULAR | Status: DC | PRN
  Administered 2018-01-28: 2 mg via INTRAVENOUS

## 2018-01-28 MED ORDER — ACETAMINOPHEN 325 MG PO TABS
650.0000 mg | ORAL_TABLET | Freq: Four times a day (QID) | ORAL | Status: DC | PRN
  Administered 2018-01-28: 650 mg via ORAL
  Filled 2018-01-28: qty 2

## 2018-01-28 MED ORDER — OXYCODONE HCL 5 MG PO TABS: 5.0000 mg | ORAL_TABLET | Freq: Four times a day (QID) | ORAL | 0 refills | Status: DC | PRN

## 2018-01-28 MED ORDER — AMPHETAMINE-DEXTROAMPHET ER 10 MG PO CP24
30.0000 mg | ORAL_CAPSULE | Freq: Every morning | ORAL | Status: DC
  Administered 2018-01-29 – 2018-01-30 (×2): 30 mg via ORAL
  Filled 2018-01-28 (×2): qty 3

## 2018-01-28 MED ORDER — MAGNESIUM SULFATE 4 GM/100ML IV SOLN
4.0000 g | Freq: Once | INTRAVENOUS | Status: AC
  Administered 2018-01-28: 4 g via INTRAVENOUS
  Filled 2018-01-28: qty 100

## 2018-01-28 MED ORDER — GABAPENTIN 300 MG PO CAPS
300.0000 mg | ORAL_CAPSULE | ORAL | Status: AC
  Administered 2018-01-28: 300 mg via ORAL
  Filled 2018-01-28: qty 1

## 2018-01-28 MED ORDER — SUGAMMADEX SODIUM 200 MG/2ML IV SOLN
INTRAVENOUS | Status: DC | PRN
  Administered 2018-01-28: 200 mg via INTRAVENOUS

## 2018-01-28 MED ORDER — LACTATED RINGERS IV SOLN
INTRAVENOUS | Status: DC
  Administered 2018-01-28: 10 mL/h via INTRAVENOUS
  Administered 2018-01-28 – 2018-01-29 (×2): via INTRAVENOUS

## 2018-01-28 MED ORDER — KETOROLAC TROMETHAMINE 30 MG/ML IJ SOLN
INTRAMUSCULAR | Status: AC
  Filled 2018-01-28: qty 1

## 2018-01-28 MED ORDER — KETOROLAC TROMETHAMINE 30 MG/ML IJ SOLN
30.0000 mg | Freq: Once | INTRAMUSCULAR | Status: AC | PRN
  Administered 2018-01-28: 30 mg via INTRAVENOUS

## 2018-01-28 MED ORDER — MIDAZOLAM HCL 2 MG/2ML IJ SOLN
INTRAMUSCULAR | Status: AC
  Filled 2018-01-28: qty 2

## 2018-01-28 SURGICAL SUPPLY — 48 items
ADH SKN CLS APL DERMABOND .7 (GAUZE/BANDAGES/DRESSINGS) ×1
APPLIER CLIP ROT 10 11.4 M/L (STAPLE) ×2
BAG SPEC RTRVL LRG 6X4 10 (ENDOMECHANICALS) ×1
BLADE CLIPPER SURG (BLADE) IMPLANT
CANISTER SUCT 3000ML PPV (MISCELLANEOUS) ×2 IMPLANT
CHLORAPREP W/TINT 26ML (MISCELLANEOUS) ×2 IMPLANT
CLIP APPLIE ROT 10 11.4 M/L (STAPLE) ×1 IMPLANT
COVER MAYO STAND STRL (DRAPES) ×2 IMPLANT
COVER SURGICAL LIGHT HANDLE (MISCELLANEOUS) ×2 IMPLANT
DERMABOND ADVANCED (GAUZE/BANDAGES/DRESSINGS) ×1
DERMABOND ADVANCED .7 DNX12 (GAUZE/BANDAGES/DRESSINGS) ×1 IMPLANT
DRAPE C-ARM 42X72 X-RAY (DRAPES) ×2 IMPLANT
DRAPE WARM FLUID 44X44 (DRAPE) ×2 IMPLANT
ELECT REM PT RETURN 9FT ADLT (ELECTROSURGICAL) ×2
ELECTRODE REM PT RTRN 9FT ADLT (ELECTROSURGICAL) ×1 IMPLANT
FILTER SMOKE EVAC LAPAROSHD (FILTER) IMPLANT
GLOVE BIO SURGEON STRL SZ 6 (GLOVE) ×2 IMPLANT
GLOVE BIOGEL PI IND STRL 6.5 (GLOVE) ×1 IMPLANT
GLOVE BIOGEL PI INDICATOR 6.5 (GLOVE) ×1
GLOVE INDICATOR 6.5 STRL GRN (GLOVE) ×2 IMPLANT
GLOVE SURG SS PI 6.5 STRL IVOR (GLOVE) ×2 IMPLANT
GOWN STRL REUS W/ TWL LRG LVL3 (GOWN DISPOSABLE) ×2 IMPLANT
GOWN STRL REUS W/ TWL XL LVL3 (GOWN DISPOSABLE) ×1 IMPLANT
GOWN STRL REUS W/TWL 2XL LVL3 (GOWN DISPOSABLE) ×2 IMPLANT
GOWN STRL REUS W/TWL LRG LVL3 (GOWN DISPOSABLE) ×4
GOWN STRL REUS W/TWL XL LVL3 (GOWN DISPOSABLE) ×2
KIT BASIN OR (CUSTOM PROCEDURE TRAY) ×2 IMPLANT
KIT TURNOVER KIT B (KITS) ×2 IMPLANT
L-HOOK LAP DISP 36CM (ELECTROSURGICAL) ×2
LHOOK LAP DISP 36CM (ELECTROSURGICAL) ×1 IMPLANT
NS IRRIG 1000ML POUR BTL (IV SOLUTION) ×2 IMPLANT
PAD ARMBOARD 7.5X6 YLW CONV (MISCELLANEOUS) ×2 IMPLANT
PENCIL BUTTON HOLSTER BLD 10FT (ELECTRODE) ×2 IMPLANT
POUCH SPECIMEN RETRIEVAL 10MM (ENDOMECHANICALS) ×2 IMPLANT
SCISSORS LAP 5X35 DISP (ENDOMECHANICALS) ×2 IMPLANT
SET CHOLANGIOGRAPH 5 50 .035 (SET/KITS/TRAYS/PACK) ×2 IMPLANT
SET IRRIG TUBING LAPAROSCOPIC (IRRIGATION / IRRIGATOR) ×2 IMPLANT
SLEEVE ENDOPATH XCEL 5M (ENDOMECHANICALS) ×6 IMPLANT
SPECIMEN JAR SMALL (MISCELLANEOUS) ×2 IMPLANT
SUT MNCRL AB 4-0 PS2 18 (SUTURE) ×2 IMPLANT
TOWEL OR 17X24 6PK STRL BLUE (TOWEL DISPOSABLE) ×2 IMPLANT
TOWEL OR 17X26 10 PK STRL BLUE (TOWEL DISPOSABLE) ×2 IMPLANT
TRAY LAPAROSCOPIC MC (CUSTOM PROCEDURE TRAY) ×2 IMPLANT
TROCAR XCEL BLUNT TIP 100MML (ENDOMECHANICALS) ×2 IMPLANT
TROCAR XCEL NON-BLD 11X100MML (ENDOMECHANICALS) ×2 IMPLANT
TROCAR XCEL NON-BLD 5MMX100MML (ENDOMECHANICALS) ×2 IMPLANT
TUBING INSUFFLATION (TUBING) ×2 IMPLANT
WATER STERILE IRR 1000ML POUR (IV SOLUTION) ×2 IMPLANT

## 2018-01-28 NOTE — Progress Notes (Signed)
RN paged Barry Dienes, MD with abnormal lab results, still pending return call.  RN spoke with Tseui, MD and reported abnormal lab results.  Informed RN he would contact Barry Dienes, MD accordingly.

## 2018-01-28 NOTE — Progress Notes (Addendum)
Pt placed into Phase II status. Family at the bedside going through instructions with RN.  Pt still hooked to monitor, presented multiple sustained runs of VTach. Pt did not lose pulse and intermittently came in & out of VTach for a period of about 10 minutes, A&O during this time, stated felt OK. Witnessed by Gladys Damme, RN and [name], MD who arrived at the bedside to check on patient while experiencing abnormal heart rhthym.  MD verbally ordered multiple Stat Labs which RN ordered accordingly, verbally ordered an EKG which was ordered by RN and completed by Amy, NT.  Ola Spurr, MD notified and arrived at the bedside shortly after.  MD placed orders to Admit pt to 6N for Tele/Observation.  Family has returned to waiting area, expressed verbal understanding of situation and why pt needs to stay, RN will update accordingly of pt status.

## 2018-01-28 NOTE — Discharge Instructions (Addendum)
[facility] Office Phone Number [phone]   POST OP INSTRUCTIONS  Always review your discharge instruction sheet given to you by the facility where your surgery was performed.  IF YOU HAVE DISABILITY OR FAMILY LEAVE FORMS, YOU MUST BRING THEM TO THE OFFICE FOR PROCESSING.  DO NOT GIVE THEM TO YOUR DOCTOR.  1. A prescription for pain medication may be given to you upon discharge.  Take your pain medication as prescribed, if needed.  If narcotic pain medicine is not needed, then you may take acetaminophen (Tylenol) or ibuprofen (Advil) as needed. 2. Take your usually prescribed medications unless otherwise directed 3. If you need a refill on your pain medication, please contact your pharmacy.  They will contact our office to request authorization.  Prescriptions will not be filled after 5pm or on week-ends. 4. You should eat very light the first 24 hours after surgery, such as soup, crackers, pudding, etc.  Resume your normal diet the day after surgery 5. It is common to experience some constipation if taking pain medication after surgery.  Increasing fluid intake and taking a stool softener will usually help or prevent this problem from occurring.  A mild laxative (Milk of Magnesia or Miralax) should be taken according to package directions if there are no bowel movements after 48 hours. 6. You may shower in 48 hours.  The surgical glue will flake off in 2-3 weeks.   7. ACTIVITIES:  No strenuous activity or heavy lifting for 1 week.   a. You may drive when you no longer are taking prescription pain medication, you can comfortably wear a seatbelt, and you can safely maneuver your car and apply brakes. b. RETURN TO WORK:  __________n/a_______________ You should see your doctor in the office for a follow-up appointment approximately three-four weeks after your surgery.    WHEN TO CALL YOUR DOCTOR: 1. Fever over 101.0 2. Nausea and/or vomiting. 3. Extreme swelling or  bruising. 4. Continued bleeding from incision. 5. Increased pain, redness, or drainage from the incision.  The clinic staff is available to answer your questions during regular business hours.  Please don't hesitate to call and ask to speak to one of the nurses for clinical concerns.  If you have a medical emergency, go to the nearest emergency room or call 911.  A surgeon from [facility] is always on call at the hospital.  For further questions, please visit [email or web address]     Post Anesthesia Home Care Instructions  Activity: Get plenty of rest for the remainder of the day. A responsible individual must stay with you for 24 hours following the procedure.  For the next 24 hours, DO NOT: -Drive a car -Operate machinery -Drink alcoholic beverages -Take any medication unless instructed by your physician -Make any legal decisions or sign important papers.  Meals: Start with liquid foods such as gelatin or soup. Progress to regular foods as tolerated. Avoid greasy, spicy, heavy foods. If nausea and/or vomiting occur, drink only clear liquids until the nausea and/or vomiting subsides. Call your physician if vomiting continues.  Special Instructions/Symptoms: Your throat may feel dry or sore from the anesthesia or the breathing tube placed in your throat during surgery. If this causes discomfort, gargle with warm salt water. The discomfort should disappear within 24 hours.  If you had a scopolamine patch placed behind your ear for the management of post- operative nausea and/or vomiting:  1. The medication in the patch is effective for 72 hours, after which it should   be removed.  Wrap patch in a tissue and discard in the trash. Wash hands thoroughly with soap and water. 2. You may remove the patch earlier than 72 hours if you experience unpleasant side effects which may include dry mouth, dizziness or visual disturbances. 3. Avoid touching the patch. Wash your hands  with soap and water after contact with the patch.      

## 2018-01-28 NOTE — Transfer of Care (Signed)
Immediate Anesthesia Transfer of Care Note  Patient: [name]  Procedure(s) Performed: LAPAROSCOPIC CHOLECYSTECTOMY (N/A Abdomen)  Patient Location: PACU  Anesthesia Type:General  Level of Consciousness: drowsy  Airway & Oxygen Therapy: Patient Spontanous Breathing and Patient connected to face mask oxygen  Post-op Assessment: Report given to RN and Post -op Vital signs reviewed and stable  Post vital signs: Reviewed and stable  Last Vitals:  Vitals Value Taken Time  BP 117/60 01/28/2018  3:40 PM  Temp    Pulse 66 01/28/2018  3:41 PM  Resp 11 01/28/2018  3:41 PM  SpO2 100 % 01/28/2018  3:41 PM  Vitals shown include unvalidated device data.  Last Pain:  Vitals:   01/28/18 1045  TempSrc:   PainSc: 0-No pain      Patients Stated Pain Goal: 3 (68/12/75 1700)  Complications: No apparent anesthesia complications

## 2018-01-28 NOTE — Anesthesia Preprocedure Evaluation (Signed)
Anesthesia Evaluation  Patient identified by MRN, date of birth, ID band Patient awake    Reviewed: Allergy & Precautions, H&P , NPO status , Patient's Chart, lab work & pertinent test results, reviewed documented beta blocker date and time   Airway Mallampati: I  TM Distance: >3 FB Neck ROM: Full    Dental  (+) Poor Dentition,    Pulmonary neg pulmonary ROS, sleep apnea and Continuous Positive Airway Pressure Ventilation , former smoker,  Non compliant w CPAP   Pulmonary exam normal breath sounds clear to auscultation       Cardiovascular Exercise Tolerance: Good hypertension, Pt. on medications and Pt. on home beta blockers negative cardio ROS Normal cardiovascular exam Rhythm:Regular Rate:Normal     Neuro/Psych negative neurological ROS  negative psych ROS   GI/Hepatic negative GI ROS, Neg liver ROS, (+) Cirrhosis       , Hepatitis -, B  Endo/Other  negative endocrine ROSdiabetes, Oral Hypoglycemic Agents  Renal/GU negative Renal ROS  negative genitourinary   Musculoskeletal   Abdominal Normal abdominal exam  (+)   Peds  Hematology negative hematology ROS (+)   Anesthesia Other Findings   Reproductive/Obstetrics negative OB ROS                             Anesthesia Physical  Anesthesia Plan  ASA: III  Anesthesia Plan: General   Post-op Pain Management:    Induction: Intravenous  PONV Risk Score and Plan: 3 and Treatment may vary due to age or medical condition and Ondansetron  Airway Management Planned: Oral ETT  Additional Equipment:   Intra-op Plan:   Post-operative Plan: Extubation in OR  Informed Consent: I have reviewed the patients History and Physical, chart, labs and discussed the procedure including the risks, benefits and alternatives for the proposed anesthesia with the patient or authorized representative who has indicated his/her understanding and  acceptance.   Dental advisory given  Plan Discussed with: CRNA and Surgeon  Anesthesia Plan Comments:         Anesthesia Quick Evaluation

## 2018-01-28 NOTE — Progress Notes (Signed)
Dr. Gershon Crane, Anesthesia MD, and Rapid Response called to verify orders on pt.  MD Ola Spurr and Tseui OK with pt going to 6N on tele.

## 2018-01-28 NOTE — Anesthesia Procedure Notes (Addendum)
Procedure Name: Intubation Date/Time: 01/28/2018 1:54 PM Performed by: Bryson Corona, CRNA Pre-anesthesia Checklist: Patient identified, Emergency Drugs available, Suction available and Patient being monitored Patient Re-evaluated:Patient Re-evaluated prior to induction Oxygen Delivery Method: Circle System Utilized Preoxygenation: Pre-oxygenation with 100% oxygen Induction Type: IV induction Ventilation: Oral airway inserted - appropriate to patient size Laryngoscope Size: Sabra Heck and 2 Grade View: Grade II Tube type: Oral Tube size: 7.0 mm Number of attempts: 1 Airway Equipment and Method: Oral airway and Bougie stylet Placement Confirmation: ETT inserted through vocal cords under direct vision,  positive ETCO2 and breath sounds checked- equal and bilateral Secured at: 22 cm Tube secured with: Tape Dental Injury: Teeth and Oropharynx as per pre-operative assessment  Comments: Pt has prominent front 2 teeth

## 2018-01-28 NOTE — Progress Notes (Signed)
Pt belongings including hearing aid, cell phone and 1 belonging bag were transported with him to 6N17.

## 2018-01-28 NOTE — Anesthesia Postprocedure Evaluation (Signed)
Anesthesia Post Note  Patient: [name]  Procedure(s) Performed: LAPAROSCOPIC CHOLECYSTECTOMY (N/A Abdomen)     Patient location during evaluation: PACU Anesthesia Type: General Level of consciousness: awake and alert Pain management: pain level controlled Vital Signs Assessment: post-procedure vital signs reviewed and stable Respiratory status: spontaneous breathing, nonlabored ventilation, respiratory function stable and patient connected to nasal cannula oxygen Cardiovascular status: blood pressure returned to baseline and stable Postop Assessment: no apparent nausea or vomiting Anesthetic complications: no    Last Vitals:  Vitals:   01/28/18 2030 01/28/18 2130  BP: 121/76 115/77  Pulse: (!) 58 60  Resp: 16 10  Temp:    SpO2: 99% 98%    Last Pain:  Vitals:   01/28/18 2030  TempSrc:   PainSc: 0-No pain                 Tiajuana Amass

## 2018-01-28 NOTE — Progress Notes (Signed)
Report to Veneta Penton RN as primary caregiver

## 2018-01-28 NOTE — Interval H&P Note (Signed)
History and Physical Interval Note:  01/28/2018 1:23 PM  [name]  has presented today for surgery, with the diagnosis of chronic calculous cholecystitis  The various methods of treatment have been discussed with the patient and family. After consideration of risks, benefits and other options for treatment, the patient has consented to  Procedure(s): LAPAROSCOPIC CHOLECYSTECTOMY WITH POSSIBLE  INTRAOPERATIVE CHOLANGIOGRAM POSSIBLE OPEN ERAS PATHWAY (N/A) as a surgical intervention .  The patient's history has been reviewed, patient examined, no change in status, stable for surgery.  I have reviewed the patient's chart and labs.  Questions were answered to the patient's satisfaction.     [name]

## 2018-01-28 NOTE — H&P (Signed)
[name] Documented: 01/06/2018 11:53 AM Location: [facility] Patient #: [id number] DOB: 1946-06-16 Single / Language: [name] / Race: Black or African American Male   History of Present Illness [name] MD; 01/06/2018 12:41 PM) The patient is a 72 year old male who presents for evaluation of gall stones. Pt is a 51 yo M who I met in 2014 who had [place]. He had lap converted to open partial left hepatectomy. He has had no recurrence of cancer. However, he presented to the ED around 10 days ago (12/27/2017) wtih severe abdominal pain. He had been having some RUQ/epigastric pain for several hours prior to that and had a decreased appetite. He tried having a BM, laying down, holding off on eating, and the pain progressively worsened to where he had to call an ambulance to pick him up. He was ruled out for MI and pancreatitis. He had a CT showing gallstones, but no biliary ductal diliation or thickened GB wall. His LFTs were essentially normal. However, he had signs of cirrhosis, with splenomegaly and abdominal varices. He had subjective fever/chills at the time, but none recorded in the ED.   He reports that the severe pain has resolved, but he still has soreness. He also still has some decreased appetite. He has no jaundice or fever/chills at this time.    CT 12/27/2017 IMPRESSION: 1. Cirrhosis with findings of portal hypertension including splenomegaly and upper abdominal varices. There is mild diffuse mesenteric edema and trace perihepatic fluid, new since the prior CT. 2. Cholelithiasis without evidence of acute cholecystitis by CT. 3. Thickened appearance of the ascending colon which may be related to underdistention and hepatic colopathy although colitis is not excluded. Clinical correlation is recommended. No bowel obstruction. Normal appendix. 4. Colonic diverticulosis. 5. Aortic Atherosclerosis (ICD10-I70.0).  Labs 12/27/2017 CMET gluc 190, Alb 3.2,  normal LFTs. CBC HCT 29.3 and plts 93k AFP 3 in Feb 2019   Past Surgical History ([name], [place]; 01/06/2018 11:53 AM) Colon Polyp Removal - Colonoscopy  Colon Polyp Removal - Open  Liver Surgery  Spinal Surgery - Neck   Diagnostic Studies History ([name], [place]; 01/06/2018 11:53 AM) Colonoscopy  1-5 years ago  Allergies ([name], [place]; 01/06/2018 11:54 AM) ACE Inhibitors  Allergies Reconciled   Medication History ([name], [place]; 01/06/2018 11:57 AM) Ondansetron (4MG Tablet Disint, Oral) Active. OxyCODONE HCl (5MG Tablet, Oral) Active. Alfuzosin HCl (10MG Tablet ER, Oral) Active. Adderall XR (30MG Capsule ER 24HR, Oral) Active. Donepezil HCl (10MG Tablet, Oral) Active. GlipiZIDE (5MG Tablet, Oral) Active. Iron (325 (65 Fe)MG Tablet, Oral) Active. Losartan Potassium (25MG Tablet, Oral) Active. MetFORMIN HCl (1000MG Tablet, Oral) Active. Metoprolol Tartrate (50MG Tablet, Oral) Active. Pravastatin Sodium (20MG Tablet, Oral) Active. Spironolactone (25MG Tablet, Oral) Active. Medications Reconciled  Social History ([name], [place]; 01/06/2018 11:53 AM) Caffeine use  Carbonated beverages, Tea. No alcohol use  No drug use  Tobacco use  Former smoker.  Family History ([name], [place]; 01/06/2018 11:53 AM) Alcohol Abuse  Family Members In General. Anesthetic complications  Family Members In General. Diabetes Mellitus  Brother, Father, Mother. Heart disease in male family member before age 59  Heart disease in male family member before age 78  Hypertension  Brother, Father, Mother.  Other Problems ([name], [place]; 01/06/2018 11:53 AM) Cancer  Diabetes Mellitus  Enlarged Prostate  Hepatitis  High blood pressure  Hypercholesterolemia  Other disease, cancer, significant illness  Sleep Apnea  Vascular Disease  Review of Systems (Edward A. Brown RMA; 01/06/2018 11:53 AM) General Not  Present- Appetite Loss, Chills, Fatigue, Fever, Night Sweats, Weight Gain and Weight Loss. Skin Not Present- Change in Wart/Mole, Dryness, Hives, Jaundice, New Lesions, Non-Healing Wounds, Rash and Ulcer. HEENT Present- Hearing Loss and Nose Bleed. Not Present- Earache, Hoarseness, Oral Ulcers, Ringing in the Ears, Seasonal Allergies, Sinus Pain, Sore Throat, Visual Disturbances, Wears glasses/contact lenses and Yellow Eyes. Respiratory Not Present- Bloody sputum, Chronic Cough, Difficulty Breathing, Snoring and Wheezing. Breast Not Present- Breast Mass, Breast Pain, Nipple Discharge and Skin Changes. Cardiovascular Present- Leg Cramps and Shortness of Breath. Not Present- Chest Pain, Difficulty Breathing Lying Down, Palpitations, Rapid Heart Rate and Swelling of Extremities. Gastrointestinal Present- Abdominal Pain and Constipation. Not Present- Bloating, Bloody Stool, Change in Bowel Habits, Chronic diarrhea, Difficulty Swallowing, Excessive gas, Gets full quickly at meals, Hemorrhoids, Indigestion, Nausea, Rectal Pain and Vomiting. Male Genitourinary Present- Impotence. Not Present- Blood in Urine, Change in Urinary Stream, Frequency, Nocturia, Painful Urination, Urgency and Urine Leakage. Musculoskeletal Not Present- Back Pain, Joint Pain, Joint Stiffness, Muscle Pain, Muscle Weakness and Swelling of Extremities. Psychiatric Not Present- Anxiety, Bipolar, Change in Sleep Pattern, Depression, Fearful and Frequent crying. Endocrine Not Present- Cold Intolerance, Excessive Hunger, Hair Changes, Heat Intolerance, Hot flashes and New Diabetes. Hematology Not Present- Easy Bruising, Excessive bleeding, Gland problems, HIV and Persistent Infections.  Vitals (Edward A. Brown RMA; 01/06/2018 11:54 AM) 01/06/2018 11:53 AM Weight: 208.2 lb Height: 69in Body Surface Area: 2.1 m Body Mass Index: 30.75 kg/m  Temp.: 98.35F  Pulse: 96 (Regular)  BP: 124/72 (Sitting, Left Arm,  Standard)       Physical Exam [name] MD; 01/06/2018 12:41 PM) General Mental Status-Alert. General Appearance-Consistent with stated age. Hydration-Well hydrated. Voice-Normal.  Head and Neck Head-normocephalic, atraumatic with no lesions or palpable masses.  Eye Sclera/Conjunctiva - Bilateral-No scleral icterus.  Chest and Lung Exam Chest and lung exam reveals -quiet, even and easy respiratory effort with no use of accessory muscles. Inspection Chest Wall - Normal. Back - normal.  Breast - Did not examine.  Cardiovascular Cardiovascular examination reveals -normal pedal pulses bilaterally. Note: regular rate and rhythm  Abdomen Inspection-Inspection Normal. Palpation/Percussion Palpation and Percussion of the abdomen reveal - Soft, No Rebound tenderness, No Rigidity (guarding) and No hepatosplenomegaly. Note: tender RUQ/epigastrium.  Peripheral Vascular Upper Extremity Inspection - Bilateral - Normal - No Clubbing, No Cyanosis, No Edema, Pulses Intact. Lower Extremity Palpation - Edema - Bilateral - No edema.  Neurologic Neurologic evaluation reveals -alert and oriented x 3 with no impairment of recent or remote memory. Mental Status-Normal.  Musculoskeletal Global Assessment -Note: no gross deformities.  Normal Exam - Left-Upper Extremity Strength Normal and Lower Extremity Strength Normal. Normal Exam - Right-Upper Extremity Strength Normal and Lower Extremity Strength Normal.  Lymphatic Head & Neck  General Head & Neck Lymphatics: Bilateral - Description - Normal. Axillary  General Axillary Region: Bilateral - Description - Normal. Tenderness - Non Tender.    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  I recommend cholecystectomy. this is complicated by his  cirrhosis and prior surgery. he is at higher risk for bleeding and higher risk for open operation. I will request a second surgeon for this.  I reviewed surgery wtih the patient along with risks. I will get a type and screen for him. He may need to stay overnight. The surgical procedure  was described to the patient in detail. The patient was given educational material. I discussed the incision type and location, the location of the gallbladder, the anatomy of the bile ducts and arteries, and the typical progression of surgery. I discussed the possibility of converting to an open operation. I advised of the risks of bleeding, infection, damage to other structures (such as the bile duct, intestine or liver), bile leak, need for other procedures or surgeries, and post op diarrhea/constipation. We discussed the risk of blood clot. We discussed the recovery period and post operative restrictions. The patient was advised against taking blood thinners the week before surgery. Current Plans Pt Education - Laparoscopic Cholecystectomy: gallbladder Schedule for Surgery   Signed by [name], MD (01/06/2018 12:44 PM)

## 2018-01-28 NOTE — Progress Notes (Signed)
Pharmacy notified to send Mag replacement to 6N.

## 2018-01-28 NOTE — Progress Notes (Signed)
Family here & fully updated. 1 hearing aid ret to pt & put in right ear.

## 2018-01-28 NOTE — Progress Notes (Signed)
Rapid response called regarding patient.  He will go by and check to make sure everything is OK just after pt had earlier irregular cardiac rhythm.

## 2018-01-28 NOTE — Progress Notes (Signed)
Assisted pt to restroom.  Pt had not voided since before surgery.  Pt able to void and steady on his feet.  Pt returned to bed and vital signs assessed.  VSS.

## 2018-01-28 NOTE — Progress Notes (Signed)
Updated family Elesa Hacker [phone] on pt location in the hospital.

## 2018-01-28 NOTE — Op Note (Signed)
Laparoscopic Cholecystectomy (and 1 hour lysis of adhesions)  Indications: This patient presents with chronic calculous cholecystitis and will undergo laparoscopic cholecystectomy.  Of note, he had colon cancer metastasectomy from left liver in 2014 via a chevron incision.  He had no gallstones at that time.  He has also developed cholecystitis.  Pre-operative Diagnosis: chronic calculous cholecystitis.  Post-operative Diagnosis: Same  Surgeon: [name]   Assistants: Gurney Maxin, MD  Anesthesia: General endotracheal anesthesia and local  ASA Class: 3  Procedure Details  The patient was seen again in the Holding Room. The risks, benefits, complications, treatment options, and expected outcomes were discussed with the patient. The possibilities of  bleeding, recurrent infection, damage to nearby structures, the need for additional procedures, failure to diagnose a condition, the possible need to convert to an open procedure, and creating a complication requiring transfusion or operation were discussed with the patient. The likelihood of improving the patient's symptoms with return to their baseline status is good.    The patient and/or family concurred with the proposed plan, giving informed consent. The site of surgery properly noted. The patient was taken to Operating Room, and the procedure verified as Laparoscopic Cholecystectomy with Intraoperative Cholangiogram. A Time Out was held and the above information confirmed.  Prior to the induction of general anesthesia, antibiotic prophylaxis was administered. General endotracheal anesthesia was then administered and tolerated well. After the induction, the abdomen was prepped with Chloraprep and draped in the sterile fashion. The patient was positioned in the supine position.  Local anesthetic agent was injected into the skin near the umbilicus and an incision made. We dissected down to the abdominal fascia with blunt dissection.  The  fascia was incised vertically and we entered the peritoneal cavity bluntly.  A pursestring suture of 0-Vicryl was placed around the fascial opening.  The Hasson cannula was inserted and secured with the stay suture.  Pneumoperitoneum was then achieved with CO2 and tolerated well without any adverse changes in the patient's vital signs. We positioned the patient in reverse Trendelenburg, tilted slightly to the patient's left.    The patient had numerous adhesions to the abdominal wall in the epigastrium.  The two right lateral 5 mm ports were placed.  Sharp and blunt dissection as well as the harmonic was used to take down the abdominal wall adhesions.  Care was taken to do the dissection near the transverse colon sharply.  Once the adhesions were down, the 11 mm trocar was placed in the epigastric location.  A fifth trocar was placed in the left mid abdomen.  The gallbladder was still not visible.  There were several varices that were divided with the harmonic.  1 hour was spent lysing adhesions.    The duodenum was taken down bluntly off the gallbladder.  It came off easily off the lower abdomen.  The omentum was taken down from the gallbladder with the harmonic scalpel.  At this point, the fundus was grasped and retracted cephalad. Adhesions were lysed bluntly and with the electrocautery where indicated, taking care not to injure any adjacent organs or viscus. The infundibulum was grasped and retracted laterally, exposing the peritoneum overlying the triangle of Calot. This was then divided and exposed in a blunt fashion. A critical view of the cystic duct and cystic artery was obtained.  The cystic duct was clearly identified and bluntly dissected circumferentially. The cystic duct was ligated with a clip distally and three clips proximally.   The cystic artery was identified, dissected free,  ligated with clips and divided as well.   The gallbladder was dissected from the liver bed in retrograde fashion  with the electrocautery. The gallbladder was removed and placed in an Endocatch bag.  The gallbladder and Endocatch bag were then removed through the umbilical port site.  The liver bed was irrigated and inspected. Hemostasis was achieved with the electrocautery. Copious irrigation was utilized and was repeatedly aspirated until clear.  A piece of SNOW hemostatic agent was placed in the gallbladder fossa.      We again inspected the right upper quadrant for hemostasis.  Pneumoperitoneum was released as we removed the trocars.   The pursestring suture was used to close the umbilical fascia.  4-0 Monocryl was used to close the skin.   The skin was cleaned and dry, and Dermabond was applied. The patient was then extubated and brought to the recovery room in stable condition. Instrument, sponge, and needle counts were correct at closure and at the conclusion of the case.   Findings: Cirrhotic liver.  Varices.  NO ascites was seen, inflamed gallbladder.    Estimated Blood Loss: 50 mL         Drains: none          Specimens: Gallbladder to pathology       Complications: None; patient tolerated the procedure well.         Disposition: PACU - hemodynamically stable.         Condition: stable

## 2018-01-29 ENCOUNTER — Encounter (HOSPITAL_COMMUNITY): Payer: Self-pay | Admitting: General Surgery

## 2018-01-29 ENCOUNTER — Other Ambulatory Visit: Payer: Self-pay

## 2018-01-29 DIAGNOSIS — I10 Essential (primary) hypertension: Secondary | ICD-10-CM | POA: Diagnosis not present

## 2018-01-29 DIAGNOSIS — K766 Portal hypertension: Secondary | ICD-10-CM | POA: Diagnosis not present

## 2018-01-29 DIAGNOSIS — K8010 Calculus of gallbladder with chronic cholecystitis without obstruction: Secondary | ICD-10-CM | POA: Diagnosis not present

## 2018-01-29 DIAGNOSIS — Z7984 Long term (current) use of oral hypoglycemic drugs: Secondary | ICD-10-CM | POA: Diagnosis not present

## 2018-01-29 DIAGNOSIS — E119 Type 2 diabetes mellitus without complications: Secondary | ICD-10-CM | POA: Diagnosis not present

## 2018-01-29 DIAGNOSIS — Z87891 Personal history of nicotine dependence: Secondary | ICD-10-CM | POA: Diagnosis not present

## 2018-01-29 DIAGNOSIS — K7460 Unspecified cirrhosis of liver: Secondary | ICD-10-CM | POA: Diagnosis not present

## 2018-01-29 DIAGNOSIS — I700 Atherosclerosis of aorta: Secondary | ICD-10-CM | POA: Diagnosis not present

## 2018-01-29 DIAGNOSIS — Z9119 Patient's noncompliance with other medical treatment and regimen: Secondary | ICD-10-CM | POA: Diagnosis not present

## 2018-01-29 DIAGNOSIS — N400 Enlarged prostate without lower urinary tract symptoms: Secondary | ICD-10-CM | POA: Diagnosis not present

## 2018-01-29 DIAGNOSIS — G4733 Obstructive sleep apnea (adult) (pediatric): Secondary | ICD-10-CM | POA: Diagnosis not present

## 2018-01-29 DIAGNOSIS — E7800 Pure hypercholesterolemia, unspecified: Secondary | ICD-10-CM | POA: Diagnosis not present

## 2018-01-29 DIAGNOSIS — Z79899 Other long term (current) drug therapy: Secondary | ICD-10-CM | POA: Diagnosis not present

## 2018-01-29 LAB — COMPREHENSIVE METABOLIC PANEL
ALT: 26 U/L (ref 17–63)
AST: 43 U/L — ABNORMAL HIGH (ref 15–41)
Albumin: 2.9 g/dL — ABNORMAL LOW (ref 3.5–5.0)
Alkaline Phosphatase: 57 U/L (ref 38–126)
Anion gap: 5 (ref 5–15)
BUN: 12 mg/dL (ref 6–20)
CO2: 22 mmol/L (ref 22–32)
Calcium: 8.3 mg/dL — ABNORMAL LOW (ref 8.9–10.3)
Chloride: 111 mmol/L (ref 101–111)
Creatinine, Ser: 1.14 mg/dL (ref 0.61–1.24)
GFR calc Af Amer: >60 mL/min (ref >60)
GFR calc non Af Amer: >60 mL/min (ref >60)
Glucose, Bld: 133 mg/dL — ABNORMAL HIGH (ref 65–99)
Potassium: 4.6 mmol/L (ref 3.5–5.1)
Sodium: 138 mmol/L (ref 135–145)
Total Bilirubin: 1.1 mg/dL (ref 0.3–1.2)
Total Protein: 5.8 g/dL — ABNORMAL LOW (ref 6.5–8.1)

## 2018-01-29 LAB — CBC
HCT: 27 % — ABNORMAL LOW (ref 39.0–52.0)
Hemoglobin: 8.4 g/dL — ABNORMAL LOW (ref 13.0–17.0)
MCH: 31 pg (ref 26.0–34.0)
MCHC: 31.1 g/dL (ref 30.0–36.0)
MCV: 99.6 fL (ref 78.0–100.0)
Platelets: 87 10*3/uL — ABNORMAL LOW (ref 150–400)
RBC: 2.71 MIL/uL — ABNORMAL LOW (ref 4.22–5.81)
RDW: 15.5 % (ref 11.5–15.5)
WBC: 3.9 10*3/uL — ABNORMAL LOW (ref 4.0–10.5)

## 2018-01-29 LAB — GLUCOSE, CAPILLARY
Glucose-Capillary: 121 mg/dL — ABNORMAL HIGH (ref 65–99)
Glucose-Capillary: 146 mg/dL — ABNORMAL HIGH (ref 65–99)
Glucose-Capillary: 172 mg/dL — ABNORMAL HIGH (ref 65–99)
Glucose-Capillary: 176 mg/dL — ABNORMAL HIGH (ref 65–99)
Glucose-Capillary: 180 mg/dL — ABNORMAL HIGH (ref 65–99)

## 2018-01-29 LAB — PROTIME-INR
INR: 1.33
Prothrombin Time: 16.4 seconds — ABNORMAL HIGH (ref 11.4–15.2)

## 2018-01-29 LAB — MAGNESIUM: Magnesium: 2.7 mg/dL — ABNORMAL HIGH (ref 1.7–2.4)

## 2018-01-29 LAB — PHOSPHORUS: Phosphorus: 5.1 mg/dL — ABNORMAL HIGH (ref 2.5–4.6)

## 2018-01-29 MED ORDER — SODIUM CHLORIDE 0.9 % IV BOLUS
500.0000 mL | Freq: Once | INTRAVENOUS | Status: AC
  Administered 2018-01-29: 500 mL via INTRAVENOUS

## 2018-01-29 MED ORDER — ALBUMIN HUMAN 5 % IV SOLN
25.0000 g | Freq: Once | INTRAVENOUS | Status: AC
Start: 2018-01-29 — End: 2018-01-29
  Administered 2018-01-29: 25 g via INTRAVENOUS
  Filled 2018-01-29: qty 500

## 2018-01-29 MED ORDER — ALBUMIN HUMAN 5 % IV SOLN
25.0000 g | Freq: Once | INTRAVENOUS | Status: DC
  Filled 2018-01-29: qty 500

## 2018-01-29 NOTE — Progress Notes (Signed)
1 Day Post-Op   Subjective/Chief Complaint: Admitted from pacu yesterday due to frequent runs of vtach for around 8-10 beats.  Received mag, improved.  Pain controlled.     Objective: Vital signs in last 24 hours: Temp:  [97 F (36.1 C)-97.5 F (36.4 C)] 97.5 F (36.4 C) (06/12 0457) Pulse Rate:  [55-69] 64 (06/12 0900) Resp:  [7-16] 16 (06/12 0457) BP: (86-121)/(47-77) 94/47 (06/12 0900) SpO2:  [90 %-100 %] 100 % (06/12 0900)    Intake/Output from previous day: 06/11 0701 - 06/12 0700 In: 1342 [P.O.:200; I.V.:1142] Out: 50 [Blood:50] Intake/Output this shift: No intake/output data recorded.  General appearance: alert, cooperative and no distress Resp: breathing comfortably GI: soft, non distended, wounds c/d/i Extremities: extremities normal, atraumatic, no cyanosis or edema  Lab Results:  Recent Labs    01/28/18 1725 01/29/18 0445  WBC 5.0 3.9*  HGB 8.7* 8.4*  HCT 27.5* 27.0*  PLT 81* 87*   BMET Recent Labs    01/28/18 1725 01/29/18 0445  NA 140 138  K 4.2 4.6  CL 109 111  CO2 21* 22  GLUCOSE 201* 133*  BUN 9 12  CREATININE 1.01 1.14  CALCIUM 8.6* 8.3*   PT/INR Recent Labs    01/29/18 0445  LABPROT 16.4*  INR 1.33   ABG No results for input(s): PHART, HCO3 in the last 72 hours.  Invalid input(s): PCO2, PO2  Studies/Results: No results found.  Anti-infectives: Anti-infectives (From admission, onward)   Start     Dose/Rate Route Frequency Ordered Stop   01/28/18 2245  ceFAZolin (ANCEF) IVPB 2g/100 mL premix     2 g 200 mL/hr over 30 Minutes Intravenous Every 8 hours 01/28/18 2240 01/29/18 0157   01/28/18 1015  ceFAZolin (ANCEF) IVPB 2g/100 mL premix     2 g 200 mL/hr over 30 Minutes Intravenous On call to O.R. 01/28/18 1009 01/28/18 1356      Assessment/Plan: s/p Procedure(s): LAPAROSCOPIC CHOLECYSTECTOMY (N/A)  V tach, hypomagnesemia, resolved.  bolus of fluids.  Keep til tomorrow since bp is soft.    LOS: 0 days    [name] 01/29/2018

## 2018-01-29 NOTE — Progress Notes (Signed)
Albumin just now being given to pt. Pharmacy called to sent new bottle- filter added. Will continue to monitor

## 2018-01-30 ENCOUNTER — Other Ambulatory Visit: Payer: Self-pay | Admitting: *Deleted

## 2018-01-30 DIAGNOSIS — E119 Type 2 diabetes mellitus without complications: Secondary | ICD-10-CM | POA: Diagnosis not present

## 2018-01-30 DIAGNOSIS — K7460 Unspecified cirrhosis of liver: Secondary | ICD-10-CM | POA: Diagnosis not present

## 2018-01-30 DIAGNOSIS — Z79899 Other long term (current) drug therapy: Secondary | ICD-10-CM | POA: Diagnosis not present

## 2018-01-30 DIAGNOSIS — K8010 Calculus of gallbladder with chronic cholecystitis without obstruction: Secondary | ICD-10-CM | POA: Diagnosis not present

## 2018-01-30 DIAGNOSIS — K766 Portal hypertension: Secondary | ICD-10-CM | POA: Diagnosis not present

## 2018-01-30 DIAGNOSIS — I700 Atherosclerosis of aorta: Secondary | ICD-10-CM | POA: Diagnosis not present

## 2018-01-30 DIAGNOSIS — G4733 Obstructive sleep apnea (adult) (pediatric): Secondary | ICD-10-CM | POA: Diagnosis not present

## 2018-01-30 DIAGNOSIS — Z7984 Long term (current) use of oral hypoglycemic drugs: Secondary | ICD-10-CM | POA: Diagnosis not present

## 2018-01-30 DIAGNOSIS — I10 Essential (primary) hypertension: Secondary | ICD-10-CM | POA: Diagnosis not present

## 2018-01-30 DIAGNOSIS — Z9119 Patient's noncompliance with other medical treatment and regimen: Secondary | ICD-10-CM | POA: Diagnosis not present

## 2018-01-30 DIAGNOSIS — N400 Enlarged prostate without lower urinary tract symptoms: Secondary | ICD-10-CM | POA: Diagnosis not present

## 2018-01-30 DIAGNOSIS — Z87891 Personal history of nicotine dependence: Secondary | ICD-10-CM | POA: Diagnosis not present

## 2018-01-30 DIAGNOSIS — E7800 Pure hypercholesterolemia, unspecified: Secondary | ICD-10-CM | POA: Diagnosis not present

## 2018-01-30 LAB — GLUCOSE, CAPILLARY
Glucose-Capillary: 127 mg/dL — ABNORMAL HIGH (ref 65–99)
Glucose-Capillary: 134 mg/dL — ABNORMAL HIGH (ref 65–99)

## 2018-01-30 LAB — COMPREHENSIVE METABOLIC PANEL
ALT: 24 U/L (ref 17–63)
AST: 39 U/L (ref 15–41)
Albumin: 3 g/dL — ABNORMAL LOW (ref 3.5–5.0)
Alkaline Phosphatase: 54 U/L (ref 38–126)
Anion gap: 9 (ref 5–15)
BUN: 12 mg/dL (ref 6–20)
CO2: 23 mmol/L (ref 22–32)
Calcium: 8.3 mg/dL — ABNORMAL LOW (ref 8.9–10.3)
Chloride: 109 mmol/L (ref 101–111)
Creatinine, Ser: 1 mg/dL (ref 0.61–1.24)
GFR calc Af Amer: >60 mL/min (ref >60)
GFR calc non Af Amer: >60 mL/min (ref >60)
Glucose, Bld: 137 mg/dL — ABNORMAL HIGH (ref 65–99)
Potassium: 4 mmol/L (ref 3.5–5.1)
Sodium: 141 mmol/L (ref 135–145)
Total Bilirubin: 1.2 mg/dL (ref 0.3–1.2)
Total Protein: 5.7 g/dL — ABNORMAL LOW (ref 6.5–8.1)

## 2018-01-30 LAB — CBC
HCT: 24.2 % — ABNORMAL LOW (ref 39.0–52.0)
Hemoglobin: 7.7 g/dL — ABNORMAL LOW (ref 13.0–17.0)
MCH: 31.4 pg (ref 26.0–34.0)
MCHC: 31.8 g/dL (ref 30.0–36.0)
MCV: 98.8 fL (ref 78.0–100.0)
Platelets: 70 10*3/uL — ABNORMAL LOW (ref 150–400)
RBC: 2.45 MIL/uL — ABNORMAL LOW (ref 4.22–5.81)
RDW: 15.6 % — ABNORMAL HIGH (ref 11.5–15.5)
WBC: 2.9 10*3/uL — ABNORMAL LOW (ref 4.0–10.5)

## 2018-01-30 NOTE — Patient Outreach (Signed)
[facility]) Care Management  01/30/2018  [name] 1946-07-23 [id number]   [place] Monthly Outreach  Referral Date:05/23/2017 Referral Source:Primary MD Reason for Referral:Medication Assistance Insurance:[employer] Medicare   Outreach Attempt:  Patient underwent Laparoscopic Cholecystectomy on 01/28/2018.  Continues to be hospitalized at [facility] on [address].  RN Health Coach sent message to Hospital Liaison to assess patient's needs.  Plan:  RN Health Coach will await recommendations from Hospital Liaison to assess patient's needs.  [place] Coach [phone] [name]

## 2018-01-30 NOTE — Discharge Planning (Signed)
Patient discharged home in stable condition. All discharge instructions reviewed by Shanon Ace, RN.

## 2018-01-30 NOTE — Consult Note (Signed)
   [facility] CM Inpatient Consult   01/30/2018  [name] [birth year] [id number]   Patient is currently active with [facility] for chronic disease management services.  Patient has been engaged by a [place] department and CSW.  Our community based plan of care has focused on disease management and community resource support.  Met with the patient and support people the [facility] at the bedside regarding ongoing follow up.  Active consent on file.   Patient will receive a post discharge call and will be evaluated for monthly home visits for assessments and disease process education.  Made Inpatient Case Manager aware that [place] Management following. Of note, [facility] Care Management services does not replace or interfere with any services that are needed or arranged by inpatient case management or social work.  For additional questions or referrals please contact:  Natividad Brood, RN BSN [place] Liaison  [phone] business mobile phone Toll free office [phone]

## 2018-01-30 NOTE — Discharge Summary (Signed)
Physician Discharge Summary  Patient ID: [name] MRN: [id number] DOB/AGE: 10/11/1945 72 y.o.  Admit date: 01/28/2018 Discharge date: 02/03/2018  Admission Diagnoses: Patient Active Problem List   Diagnosis Date Noted  . Hepatitis B 02/24/2013    Priority: High  . Cancer, hepatocellular (HCC) 02/10/2013    Priority: High  . Irregular heart beat 01/28/2018  . Noncompliance with CPAP treatment 08/14/2017  . Excessive daytime sleepiness 04/09/2017  . Snoring 04/09/2017  . Memory impairment of gradual onset 04/09/2017  . Aortic atherosclerosis (HCC) 11/27/2016  . 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 Altered mental status 03/05/2013  . Respiratory failure, post-operative (HCC) 03/02/2013  . Liver tumor-bleeding 01/15/2013  . Epidermal cyst 06/18/2012  . OSA (obstructive sleep apnea) 03/13/2012  . Abnormal leg movement 12/12/2011  . Blood pressure elevated 09/06/2011  . Diabetes mellitus (HCC) 09/06/2011  . 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 BPH (benign prostatic hyperplasia) 09/06/2011  . Diverticula of colon 09/06/2011    Discharge Diagnoses:  Active Problems:   Irregular heart beat and same as above.    Discharged Condition: stable  Hospital Course:  Pt was admitted to floor with telemetry post difficult laparoscopic cholecystectomy (secondary to adhesions from prior surgery) due to short runs of VTACH in PACU.  He was found to have hypomagnesemia and this was repleted.  Irregular heart beats resolved, however, his BP was still a bit soft (systolic 80s-los 100s).  He was given fluids and albumin as he has significant cirrhosis. These issues were resolved by POD 2.  He was voiding independently.  He was able to eat and pass gas.  He was discharged to home in stable condition.     Consults: None  Significant Diagnostic Studies: labs: Cr 1, Mag 2.7, HCT 24 (baseline 32)  Treatments: IV hydration and surgery: see above  Discharge Exam: Blood  pressure 123/66, pulse 79, temperature 97.7 F (36.5 C), temperature source Oral, resp. rate 18, SpO2 98 %. General appearance: alert, cooperative and no distress Resp: breathing comfortably GI: soft, non distended, approp tender Extremities: extremities normal, atraumatic, no cyanosis or edema  Disposition:    Discharge Instructions    Call MD for:  difficulty breathing, headache or visual disturbances   Complete by:  As directed    Call MD for:  persistant dizziness or light-headedness   Complete by:  As directed    Call MD for:  persistant nausea and vomiting   Complete by:  As directed    Call MD for:  persistant nausea and vomiting   Complete by:  As directed    Call MD for:  redness, tenderness, or signs of infection (pain, swelling, redness, odor or green/yellow discharge around incision site)   Complete by:  As directed    Call MD for:  redness, tenderness, or signs of infection (pain, swelling, redness, odor or green/yellow discharge around incision site)   Complete by:  As directed    Call MD for:  severe uncontrolled pain   Complete by:  As directed    Call MD for:  severe uncontrolled pain   Complete by:  As directed    Call MD for:  temperature >100.4   Complete by:  As directed    Call MD for:  temperature >100.4   Complete by:  As directed    Diet - low sodium heart healthy   Complete by:  As directed    Diet - low sodium heart healthy   Complete by:  As   directed    Increase activity slowly   Complete by:  As directed    Increase activity slowly   Complete by:  As directed      Allergies as of 01/30/2018      Reactions   Ace Inhibitors Swelling, Other (See Comments)   Angioedema - face.       Medication List    TAKE these medications   ACCU-CHEK SOFTCLIX LANCETS lancets Once per day testing.   alfuzosin 10 MG 24 hr tablet Commonly known as:  UROXATRAL Take 1 tablet (10 mg total) by mouth at bedtime.   amphetamine-dextroamphetamine 30 MG 24 hr  capsule Commonly known as:  ADDERALL XR Take 1 capsule (30 mg total) by mouth every morning.   blood glucose meter kit and supplies Dispense based on patient and insurance preference. Use up to four times daily as directed. (FOR ICD-9 250.00, 250.01).   donepezil 10 MG tablet Commonly known as:  ARICEPT Take 1 tablet (10 mg total) by mouth at bedtime.   glipiZIDE 5 MG tablet Commonly known as:  GLUCOTROL TAKE 1 TABLET BY MOUTH  TWICE A DAY BEFORE MEALS What changed:    how much to take  how to take this  when to take this  additional instructions   glucose blood test strip Commonly known as:  ACCU-CHEK AVIVA PLUS 1 each by Other route as needed for other. Use as instructed - once per day testing for now.   KP FERROUS SULFATE 325 (65 FE) MG tablet Generic drug:  ferrous sulfate Take 325 mg by mouth 3 (three) times daily with meals.   losartan 25 MG tablet Commonly known as:  COZAAR Take 1 tablet (25 mg total) by mouth daily.   metFORMIN 1000 MG tablet Commonly known as:  GLUCOPHAGE Take 1 tablet (1,000 mg total) by mouth 2 (two) times daily with a meal.   metoprolol succinate 50 MG 24 hr tablet Commonly known as:  TOPROL-XL Take 1 tablet (50 mg total) by mouth daily. Take with or immediately following a meal.   ondansetron 4 MG tablet Commonly known as:  ZOFRAN Take 1 tablet (4 mg total) by mouth every 6 (six) hours as needed for nausea or vomiting.   oxyCODONE 5 MG immediate release tablet Commonly known as:  ROXICODONE Take 1-2 tablets (5-10 mg total) by mouth every 6 (six) hours as needed for moderate pain or severe pain. What changed:    how much to take  when to take this  reasons to take this   pravastatin 20 MG tablet Commonly known as:  PRAVACHOL Take 1 tablet (20 mg total) by mouth at bedtime.   spironolactone 25 MG tablet Commonly known as:  ALDACTONE Take 1 tablet (25 mg total) by mouth 2 (two) times daily.      Follow-up Information     , , MD In 2 weeks.   Specialty:  General Surgery Contact information: [address] [place]  [address] [phone]           Signed:   02/03/2018, 4:36 PM  

## 2018-01-31 LAB — TYPE AND SCREEN
ABO/RH(D): B POS
Antibody Screen: POSITIVE
Donor AG Type: NEGATIVE
Donor AG Type: NEGATIVE
Unit division: 0
Unit division: 0

## 2018-01-31 LAB — GLUCOSE, CAPILLARY: Glucose-Capillary: 164 mg/dL — ABNORMAL HIGH (ref 65–99)

## 2018-01-31 LAB — BPAM RBC
Blood Product Expiration Date: 201906182359
Blood Product Expiration Date: 201906192359
Unit Type and Rh: 7300
Unit Type and Rh: 7300

## 2018-02-03 ENCOUNTER — Other Ambulatory Visit: Payer: Self-pay | Admitting: *Deleted

## 2018-02-03 ENCOUNTER — Encounter: Payer: Self-pay | Admitting: *Deleted

## 2018-02-03 NOTE — Patient Outreach (Signed)
[facility]) Care Management  02/03/2018  [name] May 29, 1946 [id number]   [place] Monthly Outreach  Referral Date:05/23/2017 Referral Source:Primary MD Reason for Referral:Medication Assistance Insurance:[employer] Medicare   Outreach Attempt:  Successful telephone outreach to patient for monthly follow up and post surgical laparoscopic cholecystectomy and recent discharge from hospital.  HIPAA verified with patient.  Patient states with recent discharge he does not feel like talking much.  RN Health Coach was able to confirm with patient, his family stayed and assisted him through the weekend and are gone back home now.  Patient reports his brother, only family in town, has recently been hospitalized and discharged home from [place].  Verbalizes he is eating solid foods when he feels like making something and also drinking ensures to supplement his meals.  States he is passing flatulence and having bowel movements.  Last bowel movement was today.  Reports some abdominal pain, mostly with movement; taking pain and nausea medicine.  Patient stating his incisions are dry and intact without any drainage.  States he has not monitored his blood sugar yet today and needs to take his morning medications still.  Discussed [place] Nurse to perform home visit to assess patient needs, patient verbally agrees.  Appointments:  Patient has surgical follow up scheduled for July 2019 ( unsure of the exact dates/states he has it written down).  Has medical follow up with Dr. Carlota Raspberry on 02/11/2018.  Plan: RN Health Coach will send referral to [facility] RN for further in home eval/assessment of care needs and management of chronic conditions.  [place] [phone] [name]

## 2018-02-04 ENCOUNTER — Other Ambulatory Visit: Payer: Self-pay

## 2018-02-04 NOTE — Patient Outreach (Addendum)
[facility]) Care Management  02/04/2018  [name] 1946/04/10 [id number]   Care Coordination: Referral received per Health Coach on 02/03/18. Referral for home evaluation/assessment of care needs and management of chronic conditions- diabetes management-has not been checking blood sugar.   Primary care office identified as completing their own TOC.  RNCM called to schedule home visit. Two patient identifiers confirmed.  72 year old with history of recent admission  following laporoscopic cholecystectomy/Ventricular tachycardia. DM, hepatitis B, HTN, Narcolepsy, hematocellular cancer, BPH, memory impairment of gradual onset.  Client denies any questions or concerns at this time.  Plan: home visit next week.  Thea Silversmith, RN, MSN, [place] Coordinator Cell: [phone]

## 2018-02-09 DIAGNOSIS — G4733 Obstructive sleep apnea (adult) (pediatric): Secondary | ICD-10-CM | POA: Diagnosis not present

## 2018-02-11 ENCOUNTER — Encounter: Payer: Self-pay | Admitting: Family Medicine

## 2018-02-11 ENCOUNTER — Ambulatory Visit (INDEPENDENT_AMBULATORY_CARE_PROVIDER_SITE_OTHER): Payer: Medicare Other | Admitting: Family Medicine

## 2018-02-11 ENCOUNTER — Ambulatory Visit (INDEPENDENT_AMBULATORY_CARE_PROVIDER_SITE_OTHER): Payer: Medicare Other

## 2018-02-11 ENCOUNTER — Other Ambulatory Visit: Payer: Self-pay

## 2018-02-11 DIAGNOSIS — R140 Abdominal distension (gaseous): Secondary | ICD-10-CM | POA: Diagnosis not present

## 2018-02-11 DIAGNOSIS — E119 Type 2 diabetes mellitus without complications: Secondary | ICD-10-CM

## 2018-02-11 DIAGNOSIS — D649 Anemia, unspecified: Secondary | ICD-10-CM | POA: Diagnosis not present

## 2018-02-11 DIAGNOSIS — R1900 Intra-abdominal and pelvic swelling, mass and lump, unspecified site: Secondary | ICD-10-CM | POA: Diagnosis not present

## 2018-02-11 DIAGNOSIS — E8809 Other disorders of plasma-protein metabolism, not elsewhere classified: Secondary | ICD-10-CM | POA: Diagnosis not present

## 2018-02-11 DIAGNOSIS — G4733 Obstructive sleep apnea (adult) (pediatric): Secondary | ICD-10-CM | POA: Diagnosis not present

## 2018-02-11 DIAGNOSIS — R1084 Generalized abdominal pain: Secondary | ICD-10-CM | POA: Diagnosis not present

## 2018-02-11 LAB — POCT CBC
Granulocyte percent: 69.2 %G (ref 37–80)
HCT, POC: 30.3 % — AB (ref 43.5–53.7)
Hemoglobin: 10 g/dL — AB (ref 14.1–18.1)
Lymph, poc: 0.8 (ref 0.6–3.4)
MCH, POC: 30.9 pg (ref 27–31.2)
MCHC: 32.9 g/dL (ref 31.8–35.4)
MCV: 94.1 fL (ref 80–97)
MID (cbc): 0.3 (ref 0–0.9)
MPV: 6.5 fL (ref 0–99.8)
POC Granulocyte: 2.3 (ref 2–6.9)
POC LYMPH PERCENT: 23.2 %L (ref 10–50)
POC MID %: 7.6 %M (ref 0–12)
Platelet Count, POC: 181 10*3/uL (ref 142–424)
RBC: 3.22 M/uL — AB (ref 4.69–6.13)
RDW, POC: 18.2 %
WBC: 3.3 10*3/uL — AB (ref 4.6–10.2)

## 2018-02-11 LAB — GLUCOSE, POCT (MANUAL RESULT ENTRY): POC Glucose: 91 mg/dl (ref 70–99)

## 2018-02-11 NOTE — Progress Notes (Signed)
I have called Dr. Marlowe Aschoff office and asked if the patient can have a sooner Post opp office visit due to abdominal swelling and pain.   Pt will be seeing the PA at her office and this appointment will be for Thursday, February 13, 2018 at [facility].   I have called patient and informed him of the appointment and he stated understanding.   Thanks, [employer]

## 2018-02-11 NOTE — Progress Notes (Signed)
Subjective:  By signing my name below, I, [name], attest that this documentation has been prepared under the direction and in the presence of Wendie Agreste, MD Electronically Signed: Ladene Artist, ED Scribe 02/11/2018 at 11:27 AM.   Patient ID: [name], male    DOB: [birth year], 72 y.o.   MRN: [id number]  Chief Complaint  Patient presents with  . Medication Refill    glipizide, spironolactone, metoprolol, metformin and blood sugar strips.    HPI [name] is a 72 y.o. male who presents to Primary Care at [facility] for hospital f/u.  Hospital F/u Admitted 6/11-17 after difficulty with laparoscopic cholecystectomy from adhesions from prior surgery by Dr. Barry Dienes. Operation on 6/11. Some runs of VTACH in PACU. Hypomagnesemia noted and repleted. BP was also slightly low with systolic in 82N to low 053Z. Treated with IV fluid and albumin. H/o cirrhosis. Issues resolved by POD 2. Most recent magnesium level of 2.7 6/12 up from 1.4 on 6/11. Albumin 2.9 on 6/12, 3.0 on 6/13. - Pt reports intermittent abdominal swelling x 1 wk. Reports that his abdomen is currently swollen and reports abdominal soreness when his belt applies pressure to his abdomen. Reports associated nausea, given Zofran which he has not taken in over 1 wk. Denies decreased appetite, vomiting, lightheadedness, dizziness, difficulty urinating, constipation, diarrhea. F/u with surgeon on 7/8. He also requests a few tabs of oxycodone since he is currently out; last dose was 2 days ago.  Post-Op Anemia Lab Results  Component Value Date   WBC 2.9 (L) 01/30/2018   HGB 7.7 (L) 01/30/2018   HCT 24.2 (L) 01/30/2018   MCV 98.8 01/30/2018   PLT 70 (L) 01/30/2018   DM Lab Results  Component Value Date   HGBA1C 5.0 01/16/2018  Variable control in the past with possible missed doses of meds. Glipizide bid before meals, metformin 1000 mg bid. Glucose readings reviewed from hospitalization with range of  121-214.  HTN Aldactone 25 mg bid, losartan 25 mg qd, Toprol 50 mg qd. BP Readings from Last 3 Encounters:  02/11/18 134/69  01/30/18 123/66  01/16/18 123/60    Patient Active Problem List   Diagnosis Date Noted  . Irregular heart beat 01/28/2018  . Noncompliance with CPAP treatment 08/14/2017  . Excessive daytime sleepiness 04/09/2017  . Snoring 04/09/2017  . Memory impairment of gradual onset 04/09/2017  . Aortic atherosclerosis ([place]) 11/27/2016  . 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 Altered mental status 03/05/2013  . Respiratory failure, post-operative ([place]) 03/02/2013  . Hepatitis B 02/24/2013  . Cancer, hepatocellular ([place]) 02/10/2013  . Liver tumor-bleeding 01/15/2013  . Epidermal cyst 06/18/2012  . OSA (obstructive sleep apnea) 03/13/2012  . Abnormal leg movement 12/12/2011  . Blood pressure elevated 09/06/2011  . Diabetes mellitus ([place]) 09/06/2011  . 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 BPH (benign prostatic hyperplasia) 09/06/2011  . Diverticula of colon 09/06/2011   Past Medical History:  Diagnosis Date  . Cancer [facility])    hepatocellular cancer   . Diabetes mellitus without complication ([place])   . Dyspnea   . Hepatitis B   . Hyperlipidemia   . Hypertension   . Irregular heart beat 01/28/2018  . Narcolepsy    per office visit note of 08/2011   . Sleep apnea    cpap   Past Surgical History:  Procedure Laterality Date  . CHOLECYSTECTOMY N/A 01/28/2018   Procedure: LAPAROSCOPIC CHOLECYSTECTOMY;  Surgeon: [name], MD;  Location: [place];  Service:  General;  Laterality: N/A;  . COLONOSCOPY WITH PROPOFOL N/A 08/27/2017   Procedure: COLONOSCOPY WITH PROPOFOL;  Surgeon: Clarene Essex, MD;  Location: [facility];  Service: Endoscopy;  Laterality: N/A;  . HOT HEMOSTASIS N/A 08/27/2017   Procedure: HOT HEMOSTASIS (ARGON PLASMA COAGULATION/BICAP);  Surgeon: Clarene Essex, MD;  Location: [facility];  Service: Endoscopy;  Laterality: N/A;  .  LAPAROSCOPIC CHOLECYSTECTOMY  01/28/2018  . LAPAROSCOPY  03/02/2013   Procedure: LAPAROSCOPY DIAGNOSTIC;  Surgeon: [name], MD;  Location: [facility];  Service: General;;  . LIVER ULTRASOUND  03/02/2013   Procedure: LIVER ULTRASOUND;  Surgeon: [name], MD;  Location: [facility];  Service: General;;  . OPEN PARTIAL HEPATECTOMY   03/02/2013   Procedure: OPEN PARTIAL HEPATECTOMY [83];  Surgeon: [name], MD;  Location: [facility];  Service: General;;  DX LAPAROSCOPY, INTRAOPERATIVE LIVER ULTRASOUND, OPEN PARTIAL HEPATECTOMY  . pinched nerve in back     Allergies  Allergen Reactions  . Ace Inhibitors Swelling and Other (See Comments)    Angioedema - face.    Prior to Admission medications   Medication Sig Start Date End Date Taking? Authorizing Provider  ACCU-CHEK SOFTCLIX LANCETS lancets Once per day testing. 08/05/17   Wendie Agreste, MD  alfuzosin (UROXATRAL) 10 MG 24 hr tablet Take 1 tablet (10 mg total) by mouth at bedtime. 12/11/17   Wendie Agreste, MD  amphetamine-dextroamphetamine (ADDERALL XR) 30 MG 24 hr capsule Take 1 capsule (30 mg total) by mouth every morning. 10/30/17   Ward Givens, NP  blood glucose meter kit and supplies Dispense based on patient and insurance preference. Use up to four times daily as directed. (FOR ICD-9 250.00, 250.01). 04/25/17   Wendie Agreste, MD  donepezil (ARICEPT) 10 MG tablet Take 1 tablet (10 mg total) by mouth at bedtime. 11/05/17   Dohmeier, Asencion Partridge, MD  ferrous sulfate (KP FERROUS SULFATE) 325 (65 FE) MG tablet Take 325 mg by mouth 3 (three) times daily with meals.     [provider]  glipiZIDE (GLUCOTROL) 5 MG tablet TAKE 1 TABLET BY MOUTH&amp;nbsp;&amp;nbsp;TWICE A DAY BEFORE MEALS Patient taking differently: Take 10 mg by mouth daily.  11/07/17   Wendie Agreste, MD  glucose blood (ACCU-CHEK AVIVA PLUS) test strip 1 each by Other route as needed for other. Use as instructed - once per day testing for now. 08/05/17   Wendie Agreste, MD  losartan (COZAAR) 25 MG tablet Take 1 tablet (25 mg total) by mouth daily. 11/07/17   Wendie Agreste, MD  metFORMIN (GLUCOPHAGE) 1000 MG tablet Take 1 tablet (1,000 mg total) by mouth 2 (two) times daily with a meal. 11/07/17   Wendie Agreste, MD  metoprolol succinate (TOPROL-XL) 50 MG 24 hr tablet Take 1 tablet (50 mg total) by mouth daily. Take with or immediately following a meal. 11/07/17   Wendie Agreste, MD  ondansetron (ZOFRAN) 4 MG tablet Take 1 tablet (4 mg total) by mouth every 6 (six) hours as needed for nausea or vomiting. Patient not taking: Reported on 0/17/5102 5/85/27   Delora Fuel, MD  oxyCODONE (ROXICODONE) 5 MG immediate release tablet Take 1-2 tablets (5-10 mg total) by mouth every 6 (six) hours as needed for moderate pain or severe pain. 01/28/18   [name], MD  pravastatin (PRAVACHOL) 20 MG tablet Take 1 tablet (20 mg total) by mouth at bedtime. 11/07/17   Wendie Agreste, MD  spironolactone (ALDACTONE) 25 MG tablet Take 1 tablet (25 mg total)  by mouth 2 (two) times daily. 11/07/17   Wendie Agreste, MD   Social History   Socioeconomic History  . Marital status: Single    Spouse name: Not on file  . Number of children: 2  . Years of education: 15+  . Highest education level: Bachelor's degree (e.g., BA, AB, BS)  Occupational History  . Not on file  Social Needs  . Financial resource strain: Very hard  . Food insecurity:    Worry: Sometimes true    Inability: Sometimes true  . Transportation needs:    Medical: No    Non-medical: No  Tobacco Use  . Smoking status: Former Smoker    Years: 1.00  . Smokeless tobacco: Never Used  Substance and Sexual Activity  . Alcohol use: No  . Drug use: No  . Sexual activity: Yes  Lifestyle  . Physical activity:    Days per week: Not on file    Minutes per session: Not on file  . Stress: To some extent  Relationships  . Social connections:    Talks on phone: Not on file    Gets together: Not  on file    Attends religious service: Not on file    Active member of club or organization: Not on file    Attends meetings of clubs or organizations: Not on file    Relationship status: Not on file  . Intimate partner violence:    Fear of current or ex partner: Not on file    Emotionally abused: Not on file    Physically abused: Not on file    Forced sexual activity: Not on file  Other Topics Concern  . Not on file  Social History Narrative   Patient is single and lives alone- became widower when 1 child was 82 year old, divorced 2nd marriage   Has #2 grown children, not in area or involved in his life   Patient is retired.   Patient has a college education.   Patient is right-handed.   Patient drinks maybe two sodas daily.   Review of Systems  Constitutional: Negative for appetite change.  Gastrointestinal: Positive for abdominal distention, abdominal pain and nausea. Negative for constipation, diarrhea and vomiting.  Genitourinary: Negative for difficulty urinating.  Neurological: Negative for dizziness and light-headedness.     Objective:   Physical Exam  Constitutional: He is oriented to person, place, and time. He appears well-developed and well-nourished.  HENT:  Head: Normocephalic and atraumatic.  Eyes: Pupils are equal, round, and reactive to light. EOM are normal.  Neck: No JVD present. Carotid bruit is not present.  Cardiovascular: Normal rate, regular rhythm and normal heart sounds. Exam reveals no gallop and no friction rub.  No murmur heard. No ectopy.  Pulmonary/Chest: Effort normal and breath sounds normal. He has no rales.  Abdominal: Soft. He exhibits no distension. There is tenderness in the right upper quadrant, epigastric area and left upper quadrant. There is no CVA tenderness.  Trocar sites have healing wounds without surrounding inflammation or discharge. Tender LUQ, mid epigastric, RUQ. No lower abdominal tenderness.  Musculoskeletal: He exhibits no  edema.  Neurological: He is alert and oriented to person, place, and time.  Skin: Skin is warm and dry.  Psychiatric: He has a normal mood and affect.  Vitals reviewed.  Vitals:   02/11/18 1044  BP: 134/69  Pulse: 85  Temp: 98.5 F (36.9 C)  TempSrc: Oral  SpO2: 100%  Weight: 195 lb (88.5 kg)  Height:  5' 9" (1.753 m)   Results for orders placed or performed in visit on 02/11/18  POCT glucose (manual entry)  Result Value Ref Range   POC Glucose 91 70 - 99 mg/dl  POCT CBC  Result Value Ref Range   WBC 3.3 (A) 4.6 - 10.2 K/uL   Lymph, poc 0.8 0.6 - 3.4   POC LYMPH PERCENT 23.2 10 - 50 %L   MID (cbc) 0.3 0 - 0.9   POC MID % 7.6 0 - 12 %M   POC Granulocyte 2.3 2 - 6.9   Granulocyte percent 69.2 37 - 80 %G   RBC 3.22 (A) 4.69 - 6.13 M/uL   Hemoglobin 10.0 (A) 14.1 - 18.1 g/dL   HCT, POC 30.3 (A) 43.5 - 53.7 %   MCV 94.1 80 - 97 fL   MCH, POC 30.9 27 - 31.2 pg   MCHC 32.9 31.8 - 35.4 g/dL   RDW, POC 18.2 %   Platelet Count, POC 181 142 - 424 K/uL   MPV 6.5 0 - 99.8 fL   Dg Abd Acute W/chest  Result Date: 02/11/2018 CLINICAL DATA:  Abdominal swelling following recent surgery EXAM: DG ABDOMEN ACUTE W/ 1V CHEST COMPARISON:  None. FINDINGS: Cardiac shadow is within normal limits. The lungs are clear bilaterally. Scattered large and small bowel gas is noted without obstructive change. Postsurgical changes are noted. No free air is seen. No bony abnormality noted. Degenerative changes of lumbar spine seen. IMPRESSION: No acute abnormality noted. Electronically Signed   By: Inez Catalina M.D.   On: 02/11/2018 12:03      Assessment & Plan:  [name] is a 72 y.o. male Hypomagnesemia - Plan: Magnesium  -Repleted hospital, check status.  Hypoalbuminemia - Plan: Comprehensive metabolic panel  -Noted at hospitalization, recheck CMP.  Abdominal distension - Plan: DG Abd Acute W/Chest Generalized abdominal pain - Plan: POCT CBC Reports distention and some persistent  discomfort since surgery.  Did not appreciate significant distention on exam, and abdomen acute series was reassuring.  CBC also reassuring.  Did arrange follow-up in the next few days with his surgical office, and can discuss pain regimen further at that time.  However if something needed prior to that visit, can certainly prescribe something short-term.  RTC/ER precautions if acute worsening  Anemia, unspecified type - Plan: POCT CBC  Postoperative, recheck CBC   Indicated improvement.  Continue to monitor.  Type 2 diabetes mellitus without complication, without long-term current use of insulin ([place]) - Plan: POCT glucose (manual entry)  -Some concerns regarding how he may be taking medications.  Advised to bring all medicines including pillbox if he does use on those at home to upcoming visit to verify doses.  Glucose okay in office, hospitalization readings also reviewed.    No orders of the defined types were placed in this encounter.  Patient Instructions   I will check your magnesium and albumin test again today as those were low in the hospital.   Please bring your medicines and pill box with you next visit to make sure you are taking medicine the correct way.   For abdominal swelling and pains.  I would like you to meet with the general surgeon.I would also discuss pain med refills with their office. I did check an xray today that looked ok.   Return to the clinic or go to the nearest emergency room if any of your symptoms worsen or new symptoms occur.   IF you received an  x-ray today, you will receive an invoice from [facility]. Please contact [facility] at [phone] with questions or concerns regarding your invoice.   IF you received labwork today, you will receive an invoice from [facility]. Please contact [facility] at [phone] with questions or concerns regarding your invoice.   Our billing staff will not be able to assist you with questions regarding  bills from these companies.  You will be contacted with the lab results as soon as they are available. The fastest way to get your results is to activate your My Chart account. Instructions are located on the last page of this paperwork. If you have not heard from [place] regarding the results in 2 weeks, please contact this office.       I personally performed the services described in this documentation, which was scribed in my presence. The recorded information has been reviewed and considered for accuracy and completeness, addended by me as needed, and agree with information above.  Signed,   [name], MD Primary Care at [place].  02/12/18 10:35 PM

## 2018-02-11 NOTE — Patient Instructions (Addendum)
I will check your magnesium and albumin test again today as those were low in the hospital.   Please bring your medicines and pill box with you next visit to make sure you are taking medicine the correct way.   For abdominal swelling and pains.  I would like you to meet with the general surgeon.I would also discuss pain med refills with their office. I did check an xray today that looked ok.   Return to the clinic or go to the nearest emergency room if any of your symptoms worsen or new symptoms occur.   IF you received an x-ray today, you will receive an invoice from [facility]. Please contact [facility] Radiology at [phone] with questions or concerns regarding your invoice.   IF you received labwork today, you will receive an invoice from [facility]. Please contact [facility] at [phone] with questions or concerns regarding your invoice.   Our billing staff will not be able to assist you with questions regarding bills from these companies.  You will be contacted with the lab results as soon as they are available. The fastest way to get your results is to activate your My Chart account. Instructions are located on the last page of this paperwork. If you have not heard from [place] regarding the results in 2 weeks, please contact this office.

## 2018-02-12 ENCOUNTER — Other Ambulatory Visit: Payer: Self-pay

## 2018-02-12 ENCOUNTER — Ambulatory Visit: Payer: Self-pay

## 2018-02-12 ENCOUNTER — Encounter: Payer: Self-pay | Admitting: Family Medicine

## 2018-02-12 LAB — COMPREHENSIVE METABOLIC PANEL
ALT: 69 IU/L — ABNORMAL HIGH (ref 0–44)
AST: 78 IU/L — ABNORMAL HIGH (ref 0–40)
Albumin/Globulin Ratio: 1.3 (ref 1.2–2.2)
Albumin: 4 g/dL (ref 3.5–4.8)
Alkaline Phosphatase: 160 IU/L — ABNORMAL HIGH (ref 39–117)
BUN/Creatinine Ratio: 8 — ABNORMAL LOW (ref 10–24)
BUN: 9 mg/dL (ref 8–27)
Bilirubin Total: 2.4 mg/dL — ABNORMAL HIGH (ref 0.0–1.2)
CO2: 21 mmol/L (ref 20–29)
Calcium: 9.1 mg/dL (ref 8.6–10.2)
Chloride: 108 mmol/L — ABNORMAL HIGH (ref 96–106)
Creatinine, Ser: 1.06 mg/dL (ref 0.76–1.27)
GFR calc Af Amer: 80 mL/min/{1.73_m2} (ref >59)
GFR calc non Af Amer: 69 mL/min/{1.73_m2} (ref >59)
Globulin, Total: 3.1 g/dL (ref 1.5–4.5)
Glucose: 87 mg/dL (ref 65–99)
Potassium: 4 mmol/L (ref 3.5–5.2)
Sodium: 143 mmol/L (ref 134–144)
Total Protein: 7.1 g/dL (ref 6.0–8.5)

## 2018-02-12 LAB — MAGNESIUM: Magnesium: 1.8 mg/dL (ref 1.6–2.3)

## 2018-02-12 NOTE — Patient Outreach (Signed)
[facility]) Care Management  02/12/2018  [name] 11/01/1945 [id number]   RNCM has a home visit scheduled for today and called prior to home visit. Client reports he is not home, adding he went to a doctor visit on yesterday due to stomach issues and has another appointment on tomorrow. Request to reschedule home visit.  Plan: home visit rescheduled.  Thea Silversmith, RN, MSN, [place] Coordinator Cell: [phone]

## 2018-02-17 DIAGNOSIS — Z9049 Acquired absence of other specified parts of digestive tract: Secondary | ICD-10-CM | POA: Diagnosis not present

## 2018-02-18 ENCOUNTER — Other Ambulatory Visit: Payer: Self-pay

## 2018-02-18 NOTE — Patient Outreach (Signed)
[facility]) Care Management   02/18/2018  BARAKA KLATT Jun 10, 1946 [id number]  JAYDEN KRATOCHVIL is an 72 y.o. male  Subjective: client reports improving.  Objective:  BP 120/64   Pulse 69   Resp 20   Ht 1.753 m (5' 9") Comment: patient reported.  Wt 201 lb (91.2 kg) Comment: patient reported.  SpO2 98%   BMI 29.68 kg/m   Review of Systems  Respiratory:       Decreased breath sounds  Cardiovascular:       S1 S2 noted, regular    Physical Exam skin warm dry, color within normal limits.  Encounter Medications:   Outpatient Encounter Medications as of 02/18/2018  Medication Sig  . alfuzosin (UROXATRAL) 10 MG 24 hr tablet Take 1 tablet (10 mg total) by mouth at bedtime.  [name] Kitchen amphetamine-dextroamphetamine (ADDERALL XR) 30 MG 24 hr capsule Take 1 capsule (30 mg total) by mouth every morning.  . donepezil (ARICEPT) 10 MG tablet Take 1 tablet (10 mg total) by mouth at bedtime.  . ferrous sulfate (KP FERROUS SULFATE) 325 (65 FE) MG tablet Take 325 mg by mouth 3 (three) times daily with meals.   [name] Kitchen glipiZIDE (GLUCOTROL) 5 MG tablet TAKE 1 TABLET BY MOUTH&amp;nbsp;&amp;nbsp;TWICE A DAY BEFORE MEALS (Patient taking differently: Take 10 mg by mouth daily. )  . losartan (COZAAR) 25 MG tablet Take 1 tablet (25 mg total) by mouth daily.  . metFORMIN (GLUCOPHAGE) 1000 MG tablet Take 1 tablet (1,000 mg total) by mouth 2 (two) times daily with a meal.  . metoprolol succinate (TOPROL-XL) 50 MG 24 hr tablet Take 1 tablet (50 mg total) by mouth daily. Take with or immediately following a meal.  . pravastatin (PRAVACHOL) 20 MG tablet Take 1 tablet (20 mg total) by mouth at bedtime.  [name] Kitchen spironolactone (ALDACTONE) 25 MG tablet Take 1 tablet (25 mg total) by mouth 2 (two) times daily.  [name] Kitchen ACCU-CHEK SOFTCLIX LANCETS lancets Once per day testing.  . blood glucose meter kit and supplies Dispense based on patient and insurance preference. Use up to four times daily as directed. (FOR  ICD-9 250.00, 250.01).  [name] Kitchen glucose blood (ACCU-CHEK AVIVA PLUS) test strip 1 each by Other route as needed for other. Use as instructed - once per day testing for now.  . ondansetron (ZOFRAN) 4 MG tablet Take 1 tablet (4 mg total) by mouth every 6 (six) hours as needed for nausea or vomiting. (Patient not taking: Reported on 02/18/2018)  . ondansetron (ZOFRAN-ODT) 4 MG disintegrating tablet DISSOLVE 1 TABLET IN MOUTH EVERY 6 HOURS AS NEEDED FOR NAUSEA  . oxyCODONE (ROXICODONE) 5 MG immediate release tablet Take 1-2 tablets (5-10 mg total) by mouth every 6 (six) hours as needed for moderate pain or severe pain. (Patient not taking: Reported on 02/18/2018)   No facility-administered encounter medications on file as of 02/18/2018.     Functional Status:   In your present state of health, do you have any difficulty performing the following activities: 02/18/2018 01/29/2018  Hearing? [place]? N -  Difficulty concentrating or making decisions? Y -  Comment concentrating "no; remembering "sometimes"; making decisions "no". -  Walking or climbing stairs? Y -  Comment walking no, climbing stairs more careful. -  Dressing or bathing? N -  Doing errands, shopping? N Y  Conservation officer, nature and eating ? N -  Using the Toilet? N -  In the past six months, have you accidently leaked urine? N -  Do  you have problems with loss of bowel control? N -  Managing your Medications? N -  Managing your Finances? N -  Housekeeping or managing your Housekeeping? N -  Some recent data might be hidden    Fall/Depression Screening:    Fall Risk  02/18/2018 02/11/2018 11/26/2017  Falls in the past year? No No No  Comment - - -  Number falls in past yr: - - (No Data)  Comment - - denies any falls in the last few months  Injury with Fall? - - -  Comment - - -  Risk Factor Category  - - -  Risk for fall due to : - - -  Risk for fall due to: Comment - - -  Follow up - - -   PHQ 2/9 Scores 02/18/2018 02/11/2018 11/21/2017 11/18/2017  11/07/2017 08/05/2017 06/10/2017  PHQ - 2 Score 0 0 0 0 0 0 0  PHQ- 9 Score - - - - - - -    Assessment: Referral received per Health Coach on 02/03/18. Referral for home evaluation/assessment of care needs and management of chronic conditions- diabetes management-has not been checking blood sugar.  72 year old with history of recent admission  following laporoscopic cholecystectomy/Ventricular tachycardia. DM, hepatitis B, HTN, Narcolepsy, hematocellular cancer, BPH, memory impairment of gradual onset.  RNCM completed home visit. Client denies any concerns post procedure. He states he has followed up with primary care provider and surgeon. He states his pain has improved and only has intermittent pain. Client denies pain at this time.   History of diabetes. Client checked his blood sugar during home visit. CBG 108 (fasting). Client acknowledges that he does not check his blood sugar like he should.   Plan: follow up re: education reinforcement of diabetes management. THN CM Care Plan Problem One     Most Recent Value  Care Plan Problem One  knowledge defict regarding diabetes management.  Role Documenting the Problem One  Care Management Coordinator  Care Plan for Problem One  Active  THN Long Term Goal   Patient will reduce Hgb A1C by .5 points within the next 90 days.  THN Long Term Goal Start Date  11/26/17  Interventions for Problem One Long Term Goal  RNCM reviewed glucose meter readings with client, encouraged client to check blood sugar and record regularly, provided [facility] calender organizer and instructed on how to use.  THN CM Short Term Goal #1   Patient will monitor CBG's daily within the next 30 days.  THN CM Short Term Goal #1 Start Date  02/18/18 [goal continued.]  Interventions for Short Term Goal #1  discussed the importance of monitoring blood sugar.  THN CM Short Term Goal #2   patient will report taking his medications every day for the next 30 days.  THN CM Short Term Goal  #2 Start Date  02/03/18  Interventions for Short Term Goal #2  medications reviewed, encouraged client to continue to take medications as prescribed.  THN CM Short Term Goal #3  patient will report decrease in surgical abdominal pain over the next 30 days.  THN CM Short Term Goal #3 Start Date  02/03/18  [facility] CM Short Term Goal #3 Met Date  02/18/18     Thea Silversmith, RN, MSN, [place] Coordinator Cell: [phone]

## 2018-02-24 DIAGNOSIS — Z9049 Acquired absence of other specified parts of digestive tract: Secondary | ICD-10-CM | POA: Diagnosis not present

## 2018-02-25 ENCOUNTER — Other Ambulatory Visit: Payer: Self-pay

## 2018-02-25 ENCOUNTER — Ambulatory Visit (INDEPENDENT_AMBULATORY_CARE_PROVIDER_SITE_OTHER): Payer: Medicare Other | Admitting: Family Medicine

## 2018-02-25 ENCOUNTER — Encounter: Payer: Self-pay | Admitting: Family Medicine

## 2018-02-25 VITALS — BP 118/66 | HR 80 | Temp 98.7°F | Ht 69.0 in | Wt 189.2 lb

## 2018-02-25 DIAGNOSIS — R945 Abnormal results of liver function studies: Secondary | ICD-10-CM | POA: Diagnosis not present

## 2018-02-25 DIAGNOSIS — E11649 Type 2 diabetes mellitus with hypoglycemia without coma: Secondary | ICD-10-CM

## 2018-02-25 DIAGNOSIS — D649 Anemia, unspecified: Secondary | ICD-10-CM | POA: Diagnosis not present

## 2018-02-25 DIAGNOSIS — R7989 Other specified abnormal findings of blood chemistry: Secondary | ICD-10-CM

## 2018-02-25 NOTE — Progress Notes (Signed)
Subjective:  By signing my name below, I, [name], attest that this documentation has been prepared under the direction and in the presence of [name], MD. Electronically Signed: [name], [place]. 02/25/2018 , 5:08 PM .  Patient was seen in Room 3 .   Patient ID: [name], male    DOB: Nov 21, 1945, 72 y.o.   MRN: [id number] Chief Complaint  Patient presents with  . review of meds    2 week f/u (Check [name] count)   HPI [name] is a 72 y.o. male  Patient was last seen on June 25th for hospitalization follow up, and review of other issues including his HTN and his diabetes. There was some question at that time regarding his medication regime at home, and had asked him to bring it in this visit.   Abdominal pain See last visit, he was post op from cholecystectomy but still had some discomfort. He had a reassuring CBC at last visit but close follow up with surgery arranged due to persistent discomfort and reported distention.   Hypomagnesemia Noted in hospital and repleted; rechecked normal at last visit.   Elevated LFT's  He has history of hepatic carcinoma, followed by oncology and general surgery.   Lab Results  Component Value Date   ALT 69 (H) 02/11/2018   AST 78 (H) 02/11/2018   ALKPHOS 160 (H) 02/11/2018   BILITOT 2.4 (H) 02/11/2018   He had CT abd and pelvis on 12/27/17, which indicated cirrhosis, portal hypertension including splenomegaly and cholelithiasis. Apparently, he had been seeing general surgery in follow up; most recent visit yesterday.   He notes intermittent abdominal pain, but has improved. He had [name] work done yesterday by Psychologist, sport and exercise, Dr. Barry Dienes, and will have the results sent in.   Post op anemia He had hemoglobin of 10.0 at last visit, that was improved from 7.7 previously.   Diabetes He is on metformin 1000 mg bid, as well as glipizide 5 mg bid. He had variable readings in the hospital, ranging from 121 to 214.    Lab Results  Component Value Date   HGBA1C 5.0 01/16/2018   Of note, he was seen by [facility] care management on July 2nd. His fasting [name] sugar was 108 at that time.   He reports he's been feeling tired and sleepy. He brought in home [name] sugar readings, with 48 on July 5th, up to 114 on July 3rd; and reading of 70 today. He brought in his medication. He mentions [facility] care management nurse will come back out towards the end of the month. He doesn't have anyone at home who can assist him in medication management regularly; does have a family member that occasionally stops by. He tries to eat breakfast every morning.   Patient Active Problem List   Diagnosis Date Noted  . Irregular heart beat 01/28/2018  . Noncompliance with CPAP treatment 08/14/2017  . Excessive daytime sleepiness 04/09/2017  . Snoring 04/09/2017  . Memory impairment of gradual onset 04/09/2017  . Aortic atherosclerosis ([name]) 11/27/2016  . 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 Altered mental status 03/05/2013  . Respiratory failure, post-operative ([name]) 03/02/2013  . Hepatitis B 02/24/2013  . Cancer, hepatocellular ([name]) 02/10/2013  . Liver tumor-bleeding 01/15/2013  . Epidermal cyst 06/18/2012  . OSA (obstructive sleep apnea) 03/13/2012  . Abnormal leg movement 12/12/2011  . [name] pressure elevated 09/06/2011  . Diabetes mellitus ([name]) 09/06/2011  . 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 BPH (benign prostatic  hyperplasia) 09/06/2011  . Diverticula of colon 09/06/2011   Past Medical History:  Diagnosis Date  . Cancer [facility])    hepatocellular cancer   . Diabetes mellitus without complication ([name])   . Dyspnea   . Hepatitis B   . Hyperlipidemia   . Hypertension   . Irregular heart beat 01/28/2018  . Narcolepsy    per office visit note of 08/2011   . Sleep apnea    cpap   Past Surgical History:  Procedure Laterality Date  . CHOLECYSTECTOMY N/A 01/28/2018   Procedure: LAPAROSCOPIC  CHOLECYSTECTOMY;  Surgeon: [name], MD;  Location: [place];  Service: General;  Laterality: N/A;  . COLONOSCOPY WITH PROPOFOL N/A 08/27/2017   Procedure: COLONOSCOPY WITH PROPOFOL;  Surgeon: Clarene Essex, MD;  Location: [facility];  Service: Endoscopy;  Laterality: N/A;  . HOT HEMOSTASIS N/A 08/27/2017   Procedure: HOT HEMOSTASIS (ARGON PLASMA COAGULATION/BICAP);  Surgeon: Clarene Essex, MD;  Location: [facility];  Service: Endoscopy;  Laterality: N/A;  . LAPAROSCOPIC CHOLECYSTECTOMY  01/28/2018  . LAPAROSCOPY  03/02/2013   Procedure: LAPAROSCOPY DIAGNOSTIC;  Surgeon: [name], MD;  Location: [facility];  Service: General;;  . LIVER ULTRASOUND  03/02/2013   Procedure: LIVER ULTRASOUND;  Surgeon: [name], MD;  Location: [facility];  Service: General;;  . OPEN PARTIAL HEPATECTOMY   03/02/2013   Procedure: OPEN PARTIAL HEPATECTOMY [83];  Surgeon: [name], MD;  Location: [facility];  Service: General;;  DX LAPAROSCOPY, INTRAOPERATIVE LIVER ULTRASOUND, OPEN PARTIAL HEPATECTOMY  . pinched nerve in back     Allergies  Allergen Reactions  . Ace Inhibitors Swelling and Other (See Comments)    Angioedema - face.    Prior to Admission medications   Medication Sig Start Date End Date Taking? Authorizing Provider  ACCU-CHEK SOFTCLIX LANCETS lancets Once per day testing. 08/05/17  Yes Wendie Agreste, MD  alfuzosin (UROXATRAL) 10 MG 24 hr tablet Take 1 tablet (10 mg total) by mouth at bedtime. 12/11/17  Yes Wendie Agreste, MD  amphetamine-dextroamphetamine (ADDERALL XR) 30 MG 24 hr capsule Take 1 capsule (30 mg total) by mouth every morning. 10/30/17  Yes Ward Givens, NP  [name] glucose meter kit and supplies Dispense based on patient and insurance preference. Use up to four times daily as directed. (FOR ICD-9 250.00, 250.01). 04/25/17  Yes Wendie Agreste, MD  donepezil (ARICEPT) 10 MG tablet Take 1 tablet (10 mg total) by mouth at bedtime. 11/05/17  Yes Dohmeier, Asencion Partridge, MD  ferrous sulfate (KP  FERROUS SULFATE) 325 (65 FE) MG tablet Take 325 mg by mouth 3 (three) times daily with meals.    Yes [provider]  glipiZIDE (GLUCOTROL) 5 MG tablet TAKE 1 TABLET BY MOUTH&amp;nbsp;&amp;nbsp;TWICE A DAY BEFORE MEALS Patient taking differently: Take 10 mg by mouth daily.  11/07/17  Yes Wendie Agreste, MD  glucose [name] (ACCU-CHEK AVIVA PLUS) test strip 1 each by Other route as needed for other. Use as instructed - once per day testing for now. 08/05/17  Yes Wendie Agreste, MD  losartan (COZAAR) 25 MG tablet Take 1 tablet (25 mg total) by mouth daily. 11/07/17  Yes Wendie Agreste, MD  metFORMIN (GLUCOPHAGE) 1000 MG tablet Take 1 tablet (1,000 mg total) by mouth 2 (two) times daily with a meal. 11/07/17  Yes Wendie Agreste, MD  metoprolol succinate (TOPROL-XL) 50 MG 24 hr tablet Take 1 tablet (50 mg total) by mouth daily. Take with or immediately following a meal. 11/07/17  Yes Carlota Raspberry,  Ranell Patrick, MD  pravastatin (PRAVACHOL) 20 MG tablet Take 1 tablet (20 mg total) by mouth at bedtime. 11/07/17  Yes Wendie Agreste, MD  spironolactone (ALDACTONE) 25 MG tablet Take 1 tablet (25 mg total) by mouth 2 (two) times daily. 11/07/17  Yes Wendie Agreste, MD   Social History   Socioeconomic History  . Marital status: Single    Spouse name: Not on file  . Number of children: 2  . Years of education: 15+  . Highest education level: Bachelor's degree (e.g., BA, AB, BS)  Occupational History  . Not on file  Social Needs  . Financial resource strain: Very hard  . Food insecurity:    Worry: Sometimes true    Inability: Sometimes true  . Transportation needs:    Medical: No    Non-medical: No  Tobacco Use  . Smoking status: Former Smoker    Years: 1.00  . Smokeless tobacco: Never Used  Substance and Sexual Activity  . Alcohol use: No  . Drug use: No  . Sexual activity: Yes  Lifestyle  . Physical activity:    Days per week: Not on file    Minutes per session: Not on file  .  Stress: To some extent  Relationships  . Social connections:    Talks on phone: Not on file    Gets together: Not on file    Attends religious service: Not on file    Active member of club or organization: Not on file    Attends meetings of clubs or organizations: Not on file    Relationship status: Not on file  . Intimate partner violence:    Fear of current or ex partner: Not on file    Emotionally abused: Not on file    Physically abused: Not on file    Forced sexual activity: Not on file  Other Topics Concern  . Not on file  Social History Narrative   Patient is single and lives alone- became widower when 1 child was 54 year old, divorced 2nd marriage   Has #2 grown children, not in area or involved in his life   Patient is retired.   Patient has a college education.   Patient is right-handed.   Patient drinks maybe two sodas daily.   Review of Systems  Constitutional: Negative for fatigue and unexpected weight change.  Eyes: Negative for visual disturbance.  Respiratory: Negative for cough, chest tightness and shortness of breath.   Cardiovascular: Negative for chest pain, palpitations and leg swelling.  Gastrointestinal: Positive for abdominal pain (intermittent). Negative for [name] in stool.  Neurological: Negative for dizziness, light-headedness and headaches.       Objective:   Physical Exam  Constitutional: He is oriented to person, place, and time. He appears well-developed and well-nourished. No distress.  HENT:  Head: Normocephalic and atraumatic.  Eyes: Pupils are equal, round, and reactive to light. EOM are normal.  Neck: Neck supple.  Cardiovascular: Normal rate, regular rhythm and normal heart sounds.  Pulmonary/Chest: Effort normal and breath sounds normal. No respiratory distress.  Musculoskeletal: Normal range of motion.  Neurological: He is alert and oriented to person, place, and time.  Skin: Skin is warm and dry.  Psychiatric: He has a normal mood  and affect. His behavior is normal.  Nursing note and vitals reviewed.   Vitals:   02/25/18 1557  BP: 118/66  Pulse: 80  Temp: 98.7 F (37.1 C)  TempSrc: Oral  SpO2: 99%  Weight: 189  lb 3.2 oz (85.8 kg)  Height: '5\' 9"'  (1.753 m)       Assessment & Plan:  [name] is a 72 y.o. male Elevated LFTs  -Repeat labs reviewed from general surgery that were improved.  Continue routine follow-up with general surgeon, including for his abdominal pain and postoperative care.  Anemia, unspecified type  -Improving at last visit, postoperative anemia that was improving.  Can recheck next visit if checked recently by his surgeon.  Type 2 diabetes mellitus with hypoglycemia without coma, without long-term current use of insulin (HCC)  -Based on home readings he appears to have some hypoglycemia, and based on last A1c is overtreated at this time.  May be a contributor to fatigue.  -Stop glipizide, but okay to continue metformin for now  - reviewed use of pill box as well as how to fill that to lessen chance of missed medications or doubling up on medications.  He expressed understanding of this process, but can also check into pill pack option through his pharmacy.    -Recheck with repeat A1c in 1 month  No orders of the defined types were placed in this encounter.  Patient Instructions    [name] sugars are running too low. Stop glipizide for now as that can cause your [name] sugar to go too low. Ok to continue metformin same dose for now. Return in 1 month for repeat diabetes test.   I will look at results form your surgeon and note from yesterday.   Fill your pill box (or have friend help) with pills as we discussed to lessen chance of missed or duplicate doses of meds. We will also look at blister pak as option. Home visit nurse can also review meds and pill box with you if needed as well. Just let me know.        IF you received an x-ray today, you will receive an invoice from  [facility]. Please contact [facility]iology at [phone] with questions or concerns regarding your invoice.   IF you received labwork today, you will receive an invoice from [place]. Please contact [facility] at [phone] with questions or concerns regarding your invoice.   Our billing staff will not be able to assist you with questions regarding bills from these companies.  You will be contacted with the lab results as soon as they are available. The fastest way to get your results is to activate your My Chart account. Instructions are located on the last page of this paperwork. If you have not heard from [place] regarding the results in 2 weeks, please contact this office.       I personally performed the services described in this documentation, which was scribed in my presence. The recorded information has been reviewed and considered for accuracy and completeness, addended by me as needed, and agree with information above.  Signed,   [name], MD [facility] at [place].  02/26/18 5:00 PM

## 2018-02-25 NOTE — Patient Instructions (Addendum)
  Blood sugars are running too low. Stop glipizide for now as that can cause your blood sugar to go too low. Ok to continue metformin same dose for now. Return in 1 month for repeat diabetes test.   I will look at results form your surgeon and note from yesterday.   Fill your pill box (or have friend help) with pills as we discussed to lessen chance of missed or duplicate doses of meds. We will also look at blister pak as option. Home visit nurse can also review meds and pill box with you if needed as well. Just let me know.        IF you received an x-ray today, you will receive an invoice from [facility] Radiology. Please contact [facility] Radiology at [phone] with questions or concerns regarding your invoice.   IF you received labwork today, you will receive an invoice from [place]. Please contact [facility] at [phone] with questions or concerns regarding your invoice.   Our billing staff will not be able to assist you with questions regarding bills from these companies.  You will be contacted with the lab results as soon as they are available. The fastest way to get your results is to activate your My Chart account. Instructions are located on the last page of this paperwork. If you have not heard from [place] regarding the results in 2 weeks, please contact this office.

## 2018-02-26 ENCOUNTER — Encounter: Payer: Self-pay | Admitting: Family Medicine

## 2018-02-28 ENCOUNTER — Telehealth: Payer: Self-pay | Admitting: Family Medicine

## 2018-02-28 NOTE — Telephone Encounter (Signed)
Pt came in to show what his glucose reading have been 7/10 - 75 7/11 - 150 7/12 - 180  Pt states he is taking Metformin 2 times daily with food. He has stopped the Glipizide.  Asked him to be sure and eat healthy grilled foods in moderation. He states he has been eating a lot of fruits - grapes, etc. Advised moderation as fruits have carbs that may raise BS.  Discussed portions of fruits, vege and protein in a balance and test BS in am If still high, call and advise so Dr. Carlota Raspberry can review and advise.

## 2018-02-28 NOTE — Telephone Encounter (Signed)
Pt came in stating that Dr. Carlota Raspberry took him off of his diabetic medication on Monday 02/24/2018 because his sugar was running low. Well now he believes his sugar is running high. He documented the past couple of days and on 02/27/2018 it was 156 and on the morning of 02/28/2018 it was 180.

## 2018-03-11 DIAGNOSIS — G4733 Obstructive sleep apnea (adult) (pediatric): Secondary | ICD-10-CM | POA: Diagnosis not present

## 2018-03-13 ENCOUNTER — Other Ambulatory Visit: Payer: Self-pay

## 2018-03-13 NOTE — Patient Outreach (Signed)
[facility]) Care Management  03/13/2018  [name] Jan 07, 1946 [id number]  Referral received per Health Coach on 02/03/18. Referral for home evaluation/assessment of care needs and management of chronic conditions- diabetes management-has not been checking blood sugar.  71 year old with history of recent admission following laporoscopic cholecystectomy/Ventricular tachycardia. DM, hepatitis B, HTN, Narcolepsy, hematocellular cancer, BPH, memory impairment.   RNCM called to follow up. No answer. Unable to leave message.   Plan: send outreach letter and follow up within 3-4 business days.  Thea Silversmith, RN, MSN, [place] Coordinator Cell: [phone]

## 2018-03-17 ENCOUNTER — Other Ambulatory Visit: Payer: Self-pay

## 2018-03-17 NOTE — Patient Outreach (Signed)
[facility]) Care Management  03/17/2018  [name] October 05, 1945 [id number]   Referral received per Health Coach on 02/03/18. Referral for home evaluation/assessment of care needs and management of chronic conditions- diabetes management-has not been checking blood sugar.  72 year old with history of recent admission following laporoscopic cholecystectomy/Ventricular tachycardia. DM, hepatitis B, HTN, Narcolepsy, hematocellular cancer, BPH, memory impairment of gradual onset.  Regarding diabetes. Client reports his blood sugar this morning was 144. He states he has been checking his blood sugars every day and that he is taking his medications as prescribed.  RNCM called to follow up client reports he has been having intermittent abdominal pains. He states he has been following up with his Psychologist, sport and exercise. He states he is awaiting a return call from his surgeon's office now. Currently client states he is not in pain.   Plan: home visit scheduled. THN CM Care Plan Problem One     Most Recent Value  Care Plan Problem One  knowledge defict regarding diabetes management.  Role Documenting the Problem One  Care Management Coordinator  Care Plan for Problem One  Active  THN Long Term Goal   Patient will reduce Hgb A1C by .5 points within the next 90 days.  [facility]N Long Term Goal Start Date  11/26/17  Interventions for Problem One Long Term Goal  RNCM discussed clients blood sugar reading and provided positive feedback that client was checking blood sugars.  [facility]N CM Short Term Goal #1   Patient will monitor CBG's daily within the next 30 days.  [facility]N CM Short Term Goal #1 Start Date  02/18/18 [goal continued.]  Interventions for Short Term Goal #1  RNCM encouraged client to continue to check blood glucose.  THN CM Short Term Goal #2   patient will report taking his medications every day for the next 30 days.  [facility]N CM Short Term Goal #2 Start Date  02/03/18  Interventions for Short Term  Goal #2  encouraged client to take medications as prescribed.  THN CM Short Term Goal #3  patient will report decrease in surgical abdominal pain over the next 30 days.  [facility]N CM Short Term Goal #3 Start Date  02/03/18  [facility] CM Short Term Goal #3 Met Date  02/18/18     Thea Silversmith, RN, MSN, [place] Coordinator Cell: [phone]

## 2018-03-27 ENCOUNTER — Other Ambulatory Visit: Payer: Self-pay

## 2018-03-27 NOTE — Patient Outreach (Signed)
[facility]) Care Management   03/27/2018  SAMAAD HASHEM [birth year] [id number]  ISSACHAR BROADY is an 72 y.o. male  Subjective:   Objective:  BP 120/60   Pulse 82   Resp 20   SpO2 98%   Review of Systems  Respiratory:       Clear, decreased breath sounds.  Cardiovascular:       S1S2 noted.    Physical Exam  Encounter Medications:   Outpatient Encounter Medications as of 03/27/2018  Medication Sig  . alfuzosin (UROXATRAL) 10 MG 24 hr tablet Take 1 tablet (10 mg total) by mouth at bedtime.  [name] Kitchen amphetamine-dextroamphetamine (ADDERALL XR) 30 MG 24 hr capsule Take 1 capsule (30 mg total) by mouth every morning.  . donepezil (ARICEPT) 10 MG tablet Take 1 tablet (10 mg total) by mouth at bedtime.  . ferrous sulfate (KP FERROUS SULFATE) 325 (65 FE) MG tablet Take 325 mg by mouth 3 (three) times daily with meals.   [name] Kitchen losartan (COZAAR) 25 MG tablet Take 1 tablet (25 mg total) by mouth daily.  . metFORMIN (GLUCOPHAGE) 1000 MG tablet Take 1 tablet (1,000 mg total) by mouth 2 (two) times daily with a meal.  . metoprolol succinate (TOPROL-XL) 50 MG 24 hr tablet Take 1 tablet (50 mg total) by mouth daily. Take with or immediately following a meal.  . pravastatin (PRAVACHOL) 20 MG tablet Take 1 tablet (20 mg total) by mouth at bedtime.  [name] Kitchen spironolactone (ALDACTONE) 25 MG tablet Take 1 tablet (25 mg total) by mouth 2 (two) times daily.  [name] Kitchen ACCU-CHEK SOFTCLIX LANCETS lancets Once per day testing.  . blood glucose meter kit and supplies Dispense based on patient and insurance preference. Use up to four times daily as directed. (FOR ICD-9 250.00, 250.01).  [name] Kitchen glipiZIDE (GLUCOTROL) 5 MG tablet TAKE 1 TABLET BY MOUTH&amp;nbsp;&amp;nbsp;TWICE A DAY BEFORE MEALS (Patient not taking: Reported on 03/27/2018)  . glucose blood (ACCU-CHEK AVIVA PLUS) test strip 1 each by Other route as needed for other. Use as instructed - once per day testing for now.   No facility-administered  encounter medications on file as of 03/27/2018.     Functional Status:   In your present state of health, do you have any difficulty performing the following activities: 02/18/2018 01/29/2018  Hearing? [place]? N -  Difficulty concentrating or making decisions? Y -  Comment concentrating "no; remembering "sometimes"; making decisions "no". -  Walking or climbing stairs? Y -  Comment walking no, climbing stairs more careful. -  Dressing or bathing? N -  Doing errands, shopping? N Y  Conservation officer, nature and eating ? N -  Using the Toilet? N -  In the past six months, have you accidently leaked urine? N -  Do you have problems with loss of bowel control? N -  Managing your Medications? N -  Managing your Finances? N -  Housekeeping or managing your Housekeeping? N -  Some recent data might be hidden    Fall/Depression Screening:    Fall Risk  02/18/2018 02/11/2018 11/26/2017  Falls in the past year? No No No  Comment - - -  Number falls in past yr: - - (No Data)  Comment - - denies any falls in the last few months  Injury with Fall? - - -  Comment - - -  Risk Factor Category  - - -  Risk for fall due to : - - -  Risk for fall due to:  Comment - - -  Follow up - - -   PHQ 2/9 Scores 02/18/2018 02/11/2018 11/21/2017 11/18/2017 11/07/2017 08/05/2017 06/10/2017  PHQ - 2 Score 0 0 0 0 0 0 0  PHQ- 9 Score - - - - - - -    Assessment: Referral received per Health Coach on 02/03/18. Referral for home evaluation/assessment of care needs and management of chronic conditions- diabetes management-has not been checking blood sugar.  72 year old with history of recent admission following laporoscopic cholecystectomy/Ventricular tachycardia. DM, hepatitis B, HTN, Narcolepsy, hematocellular cancer, BPH, memory impairment of gradual onset.  Home visit completed. Client is currently in the diabetes program. Client has been checking blood sugar and recording results. RNCM provided positive feedback and  encouraged client to continue. He states he is checking his blood sugar before he eats and tries not to eat after 9 pm. Client viewed EMMI video on Diabetes and Nutrition.  Medications reviewed: client seems to have a good understanding of medications. However, per primary care office note question of how client may be taking his medications. Client is not using a pill box at this time. He states he is going to start. He reports he does not forget to take his medication. Client is agreeable to referral to pharmacy to assess medication management.  RNCM discussed the next phase as transitioning to health coach for continued disease management. Client is agreeable to transitioning back to health coach.  RNCM discussed socialization. Client states he primarily stays at home, but states he did go the the movies recently. RNCM encouraged client to remain active and socialize. Client is agreeable to social work referral for resources that may assist with increasing socialization.  Plan: pharmacy referral and referral to social work for resources to increase socialization. Follow up with client in 2-3 weeks telephonically before transitioning back to health coach.  Thea Silversmith, RN, MSN, [place] Coordinator Cell: [phone]

## 2018-03-31 ENCOUNTER — Ambulatory Visit: Payer: Medicare Other | Admitting: Family Medicine

## 2018-04-01 ENCOUNTER — Other Ambulatory Visit: Payer: Self-pay | Admitting: Pharmacist

## 2018-04-01 NOTE — Patient Outreach (Addendum)
[facility]) Care Management  04/01/2018  [name] [birth year] [id number]   72 year old male referred to [place] [facility] for diabetes management. [place] services requested for medication adherence.  PMHx includes, but not limited to, diabetes mellitus type 2, BPH, OSA, memory impairment, HTN, history of hepatic carcinoma, cirrhosis, portal hypertension, and recent cholecystectomy.    Noted recent office visit with Dr. Carlota Raspberry in July 2019.  Glipizide d/c'cd due to hypoglycemia.  Pill box or pill pack option reviewed by provider.    Unsuccessful calls placed today to reach [name].  Home phone not answered and no voicemail available.  I left messages on phone numbers listed for emergency contacts on file [name] + [name]).   Plan: I will send letter to [name] describing [facility] services and will follow-up again later this week.   Ralene Bathe, PharmD, [place] [phone]    Return call received from [name].  He is agreeable to review medications and discuss adherence tomorrow morning.   Plan: I will outreach patient tomorrow morning.   Ralene Bathe, PharmD, [place] [phone]

## 2018-04-02 ENCOUNTER — Other Ambulatory Visit: Payer: Self-pay | Admitting: Pharmacist

## 2018-04-02 ENCOUNTER — Other Ambulatory Visit: Payer: Self-pay

## 2018-04-02 NOTE — Patient Outreach (Addendum)
[facility] [facility]) Care Management  04/02/2018  [name] [birth year] [id number]   Outreach to [name] regarding social work referral for resources to increase socialization.  [name] agreed to social worker mailing information on the following:  [facility] Senior Resources of [place] [employer]  [place] [employer].    BSW will follow up next week to ensure receipt of resources mailed.   [name], BSW Social Worker [phone]

## 2018-04-02 NOTE — Patient Outreach (Signed)
[facility]) Care Management  04/02/2018  [name] [birth year] [id number]   72 year old male referred to [place] [facility] for diabetes management. [place] services requested for medication adherence.  PMHx includes, but not limited to, diabetes mellitus type 2, BPH, OSA, memory impairment, HTN, history of hepatic carcinoma, cirrhosis, portal hypertension, and recent cholecystectomy.    [facility] pharmacy familiar with patient as he has previously been referred for medication assistance and medication adherence  Noted recent office visit with Dr. Carlota Raspberry in July 2019.  Glipizide d/c'd due to hypoglycemia.  Pill box or pill pack option reviewed by provider.  Subjective: Successful call placed to [name] today.  HIPAA identifiers verified. Patient agreeable to review medications.  He reports he has been using a pillbox and is doing well with it.  He is interested in switching to compliance packs and delivery if possible.  He reports he receives some medications from mail order ([facility]) and Field seismologist). He reports he checks his blood sugar once daily and today CBG = 122.    Objective: Hemoglobin A1C: 5.0 (01/16/18) Hemoglobin: 10.0 (02/11/18)  Medications Reviewed Today    Reviewed by Rudean [name], RPH (Pharmacist) on 04/02/18 at [phone]  Med List Status: <None>  Medication Order Taking? Sig Documenting Provider Last Dose Status Informant  ACCU-CHEK SOFTCLIX LANCETS lancets [id number] Yes Once per day testing. Wendie Agreste, MD Taking Active Self  alfuzosin (UROXATRAL) 10 MG 24 hr tablet [id number] Yes Take 1 tablet (10 mg total) by mouth at bedtime. Wendie Agreste, MD Taking Active Self  amphetamine-dextroamphetamine (ADDERALL XR) 30 MG 24 hr capsule [id number] Yes Take 1 capsule (30 mg total) by mouth every morning. Ward Givens, NP Taking Active Self  blood glucose meter kit and supplies [id number] Yes Dispense based on patient and  insurance preference. Use up to four times daily as directed. (FOR ICD-9 250.00, 250.01). Wendie Agreste, MD Taking Active Self  donepezil (ARICEPT) 10 MG tablet [id number] Yes Take 1 tablet (10 mg total) by mouth at bedtime. Dohmeier, Asencion Partridge, MD Taking Active Self  ferrous sulfate (KP FERROUS SULFATE) 325 (65 FE) MG tablet [id number] Yes Take 325 mg by mouth 3 (three) times daily with meals.  [provider] Taking Active Self  glucose blood (ACCU-CHEK AVIVA PLUS) test strip [id number] Yes 1 each by Other route as needed for other. Use as instructed - once per day testing for now. Wendie Agreste, MD Taking Active Self  losartan (COZAAR) 25 MG tablet [id number] Yes Take 1 tablet (25 mg total) by mouth daily. Wendie Agreste, MD Taking Active Self  metFORMIN (GLUCOPHAGE) 1000 MG tablet [id number] Yes Take 1 tablet (1,000 mg total) by mouth 2 (two) times daily with a meal. Wendie Agreste, MD Taking Active Self  metoprolol succinate (TOPROL-XL) 50 MG 24 hr tablet [id number] Yes Take 1 tablet (50 mg total) by mouth daily. Take with or immediately following a meal. Wendie Agreste, MD Taking Active Self  pravastatin (PRAVACHOL) 20 MG tablet [id number] Yes Take 1 tablet (20 mg total) by mouth at bedtime. Wendie Agreste, MD Taking Active Self  spironolactone (ALDACTONE) 25 MG tablet [id number] Yes Take 1 tablet (25 mg total) by mouth 2 (two) times daily. Wendie Agreste, MD Taking Active Self           Med Note Caryn Section, [place] A   Wed Jan 08, 2018 10:10 AM)  Assessment:   Drugs sorted by system:  Neurologic/Psychologic:amphetamine-dextroamphetamine, donepezil  Cardiovascular: losartan, metoprolol succinate, pravastatin, spironolatone  Gastrointestinal: ferrous sulfate  Endocrine: metformin  Miscellaneous: Uroxatral  Other issues noted:  Allergy to ACEI noted and patient on concomitant ARB.  No issues per patient thus far.    Monitor potassium closely with losartan  and spironolactone.   Medication adherence: Patient reports no issues with adherence and is currently using pillbox to aide with dosing.  He would like to start using compliance packs however [facility] and [facility] do not offer this service.  Patient agreeable to transfer prescriptions to another local pharmacy that can provide compliance packs and delivery to him.  Per review of [facility], patient recently had 61 day supply filled for all medications except donepezil on July 27 and Aug 3rd.  Next available refills will be in October.  I will reach out to patient in October to update medication list and assist with initiation of compliance packs through local pharmacy.    I reviewed [employer] OTC catalogue with patient as he is not currently using this benefit from his insurance.  I will request [facility] SW to include copy of catalogue in information being mailed to him today.  Patient voiced understanding on how to order products online.   Medication management: Recent hypoglycemia per MD notes.  Patient denies having any low blood sugars since stopping glipizide.   Patient reports he does not have a BP cuff to monitor BP at home.    Plan: I will reach out to [place] regarding BP cuff.  I will follow-up with patient in October regarding compliance packs.   Ralene Bathe, PharmD, [place] [phone]

## 2018-04-03 ENCOUNTER — Ambulatory Visit: Payer: Medicare Other | Admitting: Pharmacist

## 2018-04-04 ENCOUNTER — Ambulatory Visit (INDEPENDENT_AMBULATORY_CARE_PROVIDER_SITE_OTHER): Payer: Medicare Other | Admitting: Family Medicine

## 2018-04-04 ENCOUNTER — Encounter: Payer: Self-pay | Admitting: Family Medicine

## 2018-04-04 VITALS — BP 108/58 | HR 77 | Temp 98.8°F | Ht 69.0 in | Wt 192.4 lb

## 2018-04-04 DIAGNOSIS — D649 Anemia, unspecified: Secondary | ICD-10-CM | POA: Diagnosis not present

## 2018-04-04 DIAGNOSIS — E11649 Type 2 diabetes mellitus with hypoglycemia without coma: Secondary | ICD-10-CM | POA: Diagnosis not present

## 2018-04-04 DIAGNOSIS — R945 Abnormal results of liver function studies: Secondary | ICD-10-CM | POA: Diagnosis not present

## 2018-04-04 DIAGNOSIS — F439 Reaction to severe stress, unspecified: Secondary | ICD-10-CM | POA: Diagnosis not present

## 2018-04-04 DIAGNOSIS — R7989 Other specified abnormal findings of blood chemistry: Secondary | ICD-10-CM

## 2018-04-04 NOTE — Patient Instructions (Addendum)
For nervous symptoms, you may have some adjustment disorder that can happen after medical illness.  See information below on this condition.  It may also be helpful to meet with a therapist to discuss symptoms further, but I am happy to discuss them further in the next few weeks.  If any worsening of the symptoms, please follow-up sooner with me or the emergency room.   No change in medications at this time.  Once I have your results on blood sugar, liver test, and blood counts, we can discuss if other changes needed.  Thank you for coming in today.   Adjustment Disorder, Adult Adjustment disorder is a group of symptoms that can develop after a stressful life event, such as the loss of a job or serious physical illness. The symptoms can affect how you feel, think, and act. They may interfere with your relationships. Adjustment disorder increases your risk of suicide and substance abuse. If this disorder is not managed early, it can develop into a more serious condition, such as major depressive disorder or post-traumatic stress disorder. What are the causes? This condition happens when you have trouble recovering from or coping with a stressful life event. What increases the risk? You are more likely to develop this condition if:  You have had depression or anxiety.  You are being treated for a long-term (chronic) illness.  You are being treated for an illness that cannot be cured (terminal illness).  You have a family history of mental illness.  What are the signs or symptoms? Symptoms of this condition include:  Extreme trouble doing daily tasks, such as going to work.  Sadness, depression, or crying spells.  Worrying a lot.  Loss of enjoyment.  Change in appetite or weight.  Feelings of loss or hopelessness.  Thoughts of suicide.  Anxiety, worry, or nervousness.  Trouble sleeping.  Avoiding family and friends.  Fighting or vandalism.  Complaining of feeling sick  without being ill.  Feeling dazed or disconnected.  Nightmares.  Trouble sleeping.  Irritability.  Reckless driving.  Poor work Systems analyst.  Ignoring bills.  Symptoms of this condition start within three months of the stressful event. They do not last more than six months, unless the stressful circumstances last longer. Normal grieving after the death of a loved one is not a symptom of this condition. How is this diagnosed? To diagnose this condition, your health care provider will ask about what has happened in your life and how it has affected you. He or she may also ask about your medical history and your use of medicines, alcohol, and other substances. Your health care provider may do a physical exam and order lab tests or other studies. You may be referred to a mental health specialist. How is this treated? Treatment options for this condition include:  Counseling or talk therapy. Talk therapy is usually provided by mental health specialists.  Medicines. Certain medicines may help with depression, anxiety, and sleep.  Support groups. These offer emotional support, advice, and guidance. They are made up of people who have had similar experiences.  Observation and time. This is sometimes called "watchful waiting." In this treatment, health care providers monitor your health and behavior without other treatment. Adjustment disorder sometimes gets better on its own with time.  Follow these instructions at home:  Take over-the-counter and prescription medicines only as told by your health care provider.  Keep all follow-up visits as told by your health care provider. This is important. Contact a health  care provider if:  Your symptoms do not improve in six months.  Your symptoms get worse. Get help right away if:  You have serious thoughts about hurting yourself or someone else. If you ever feel like you may hurt yourself or others, or have thoughts about taking your own  life, get help right away. You can go to your nearest emergency department or call:  Your local emergency services (911 in the [place]  A suicide crisis helpline, such as the [place] at 934-770-9432. This is open 24 hours a day.  Summary  Adjustment disorder is a group of symptoms that can develop after a stressful life event, such as the loss of a job or serious physical illness. The symptoms can affect how you feel, think, and act. They may interfere with your relationships.  Symptoms of this condition start within three months of the stressful event. They do not last more than six months, unless the stressful circumstances last longer.  Treatment may include talk therapy, medicines, participation in a support group, or observation to see if symptoms improve.  Contact your health care provider if your symptoms get worse or do not improve in six months.  If you ever feel like you may hurt yourself or others, or have thoughts about taking your own life, get help right away. This information is not intended to replace advice given to you by your health care provider. Make sure you discuss any questions you have with your health care provider. Document Released: 04/10/2006 Document Revised: 10/05/2016 Document Reviewed: 10/05/2016 Elsevier Interactive Patient Education  [employer].    if you have lab work done today you will be contacted with your lab results within the next 2 weeks.  If you have not heard from [place] then please contact us. The fastest way to get your results is to register for My Chart.    IF you received an x-ray today, you will receive an invoice from [facility]. Please contact [facility] at [phone] with questions or concerns regarding your invoice.   IF you received labwork today, you will receive an invoice from [place]. Please contact [facility] at [phone] with questions or concerns regarding your  invoice.   Our billing staff will not be able to assist you with questions regarding bills from these companies.  You will be contacted with the lab results as soon as they are available. The fastest way to get your results is to activate your My Chart account. Instructions are located on the last page of this paperwork. If you have not heard from [place] regarding the results in 2 weeks, please contact this office.

## 2018-04-04 NOTE — Progress Notes (Signed)
Subjective:    Patient ID: [name], male    DOB: September 08, 1945, 72 y.o.   MRN: [id number]  HPI [name] is a 72 y.o. male Presents today for: Chief Complaint  Patient presents with  . Elevated LFT    f/u    Here for follow-up.  Last seen July 9, multiple concerns addressed at that time.  Elevated LFTs General surgeon had repeated lab work and were improving.  Plan for continued follow-up with his general surgeon for postoperative care.  Did have postop anemia that was also improving.  Plan for recheck today.  Lab Results  Component Value Date   ALT 69 (H) 02/11/2018   AST 78 (H) 02/11/2018   ALKPHOS 160 (H) 02/11/2018   BILITOT 2.4 (H) 02/11/2018   LFTs on 02/13/2018: AST 47, ALT 39, alk phos 128  Lab Results  Component Value Date   WBC 3.3 (A) 02/11/2018   HGB 10.0 (A) 02/11/2018   HCT 30.3 (A) 02/11/2018   MCV 94.1 02/11/2018   PLT 70 (L) 01/30/2018     Diabetes: A symptom readings appear to have some hypoglycemia and suspected overtreatment based on A1c.  Thought to be a contributor to his fatigue.  We reviewed the use of a pillbox at his last visit to minimize risk of doubling up or missing medications.  Glipizide was discontinued, and on metformin.  Plan for repeat A1c today. Lab Results  Component Value Date   HGBA1C 5.0 01/16/2018   Home reading 113 this morning, sometimes in 180. No symptomatic lows. Less fatigued than last ov but not back to 100%.   Dark stools at times. Taking iron 3 times per day.   Feels nervous at times. Does not feel depressed. Has some difficulty putting symptoms these symptoms into words. Stomach feels upset at times. Symptoms are not every day.  Only occasional.  Denies depression, suicidal or homicidal ideation, no hallucinations.   Depression screen [facility] 2/9 04/04/2018 02/18/2018 02/11/2018 11/21/2017 11/18/2017  Decreased Interest 0 0 0 0 0  Down, Depressed, Hopeless 0 0 0 0 0  PHQ - 2 Score 0 0 0 0 0  Altered  sleeping - - - - -  Tired, decreased energy - - - - -  Change in appetite - - - - -  Feeling bad or failure about yourself  - - - - -  Trouble concentrating - - - - -  Moving slowly or fidgety/restless - - - - -  Suicidal thoughts - - - - -  PHQ-9 Score - - - - -  Difficult doing work/chores - - - - -  Some recent data might be hidden    Patient Active Problem List   Diagnosis Date Noted  . Irregular heart beat 01/28/2018  . Noncompliance with CPAP treatment 08/14/2017  . Excessive daytime sleepiness 04/09/2017  . Snoring 04/09/2017  . Memory impairment of gradual onset 04/09/2017  . Aortic atherosclerosis ([place]) 11/27/2016  . 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 Altered mental status 03/05/2013  . Respiratory failure, post-operative ([place]) 03/02/2013  . Hepatitis B 02/24/2013  . Cancer, hepatocellular ([place]) 02/10/2013  . Liver tumor-bleeding 01/15/2013  . Epidermal cyst 06/18/2012  . OSA (obstructive sleep apnea) 03/13/2012  . Abnormal leg movement 12/12/2011  . Blood pressure elevated 09/06/2011  . Diabetes mellitus ([place]) 09/06/2011  . 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 BPH (benign prostatic hyperplasia) 09/06/2011  . Diverticula of colon 09/06/2011   Past Medical History:  Diagnosis Date  . Cancer [facility])    hepatocellular cancer   . Diabetes mellitus without complication ([place])   . Dyspnea   . Hepatitis B   . Hyperlipidemia   . Hypertension   . Irregular heart beat 01/28/2018  . Narcolepsy    per office visit note of 08/2011   . Sleep apnea    cpap   Past Surgical History:  Procedure Laterality Date  . CHOLECYSTECTOMY N/A 01/28/2018   Procedure: LAPAROSCOPIC CHOLECYSTECTOMY;  Surgeon: [name], MD;  Location: [facility];  Service: General;  Laterality: N/A;  . COLONOSCOPY WITH PROPOFOL N/A 08/27/2017   Procedure: COLONOSCOPY WITH PROPOFOL;  Surgeon: Clarene Essex, MD;  Location: [facility];  Service: Endoscopy;  Laterality: N/A;  . HOT  HEMOSTASIS N/A 08/27/2017   Procedure: HOT HEMOSTASIS (ARGON PLASMA COAGULATION/BICAP);  Surgeon: Clarene Essex, MD;  Location: [facility];  Service: Endoscopy;  Laterality: N/A;  . LAPAROSCOPIC CHOLECYSTECTOMY  01/28/2018  . LAPAROSCOPY  03/02/2013   Procedure: LAPAROSCOPY DIAGNOSTIC;  Surgeon: [name], MD;  Location: [facility];  Service: General;;  . LIVER ULTRASOUND  03/02/2013   Procedure: LIVER ULTRASOUND;  Surgeon: [name], MD;  Location: [facility];  Service: General;;  . OPEN PARTIAL HEPATECTOMY   03/02/2013   Procedure: OPEN PARTIAL HEPATECTOMY [83];  Surgeon: [name], MD;  Location: [facility];  Service: General;;  DX LAPAROSCOPY, INTRAOPERATIVE LIVER ULTRASOUND, OPEN PARTIAL HEPATECTOMY  . pinched nerve in back     Allergies  Allergen Reactions  . Ace Inhibitors Swelling and Other (See Comments)    Angioedema - face.    Prior to Admission medications   Medication Sig Start Date End Date Taking? Authorizing Provider  ACCU-CHEK SOFTCLIX LANCETS lancets Once per day testing. 08/05/17  Yes Wendie Agreste, MD  alfuzosin (UROXATRAL) 10 MG 24 hr tablet Take 1 tablet (10 mg total) by mouth at bedtime. 12/11/17  Yes Wendie Agreste, MD  amphetamine-dextroamphetamine (ADDERALL XR) 30 MG 24 hr capsule Take 1 capsule (30 mg total) by mouth every morning. 10/30/17  Yes Ward Givens, NP  blood glucose meter kit and supplies Dispense based on patient and insurance preference. Use up to four times daily as directed. (FOR ICD-9 250.00, 250.01). 04/25/17  Yes Wendie Agreste, MD  donepezil (ARICEPT) 10 MG tablet Take 1 tablet (10 mg total) by mouth at bedtime. 11/05/17  Yes Dohmeier, Asencion [place], MD  ferrous sulfate (KP FERROUS SULFATE) 325 (65 FE) MG tablet Take 325 mg by mouth 3 (three) times daily with meals.    Yes [provider]  glucose blood (ACCU-CHEK AVIVA PLUS) test strip 1 each by Other route as needed for other. Use as instructed - once per day testing for now. 08/05/17  Yes  Wendie Agreste, MD  losartan (COZAAR) 25 MG tablet Take 1 tablet (25 mg total) by mouth daily. 11/07/17  Yes Wendie Agreste, MD  metFORMIN (GLUCOPHAGE) 1000 MG tablet Take 1 tablet (1,000 mg total) by mouth 2 (two) times daily with a meal. 11/07/17  Yes Wendie Agreste, MD  metoprolol succinate (TOPROL-XL) 50 MG 24 hr tablet Take 1 tablet (50 mg total) by mouth daily. Take with or immediately following a meal. 11/07/17  Yes Wendie Agreste, MD  pravastatin (PRAVACHOL) 20 MG tablet Take 1 tablet (20 mg total) by mouth at bedtime. 11/07/17  Yes Wendie Agreste, MD  spironolactone (ALDACTONE) 25 MG tablet Take 1 tablet (25 mg total) by mouth 2 (two) times daily. 11/07/17  Yes Wendie Agreste, MD   Social History   Socioeconomic History  . Marital status: Single    Spouse name: Not on file  . Number of children: 2  . Years of education: 15+  . Highest education level: Bachelor's degree (e.g., BA, AB, BS)  Occupational History  . Not on file  Social Needs  . Financial resource strain: Very hard  . Food insecurity:    Worry: Sometimes true    Inability: Sometimes true  . Transportation needs:    Medical: No    Non-medical: No  Tobacco Use  . Smoking status: Former Smoker    Years: 1.00  . Smokeless tobacco: Never Used  Substance and Sexual Activity  . Alcohol use: No  . Drug use: No  . Sexual activity: Yes  Lifestyle  . Physical activity:    Days per week: Not on file    Minutes per session: Not on file  . Stress: To some extent  Relationships  . Social connections:    Talks on phone: Not on file    Gets together: Not on file    Attends religious service: Not on file    Active member of club or organization: Not on file    Attends meetings of clubs or organizations: Not on file    Relationship status: Not on file  . Intimate partner violence:    Fear of current or ex partner: Not on file    Emotionally abused: Not on file    Physically abused: Not on file     Forced sexual activity: Not on file  Other Topics Concern  . Not on file  Social History Narrative   Patient is single and lives alone- became widower when 1 child was 91 year old, divorced 2nd marriage   Has #2 grown children, not in area or involved in his life   Patient is retired.   Patient has a college education.   Patient is right-handed.   Patient drinks maybe two sodas daily.      Review of Systems  Constitutional: Negative for fatigue and unexpected weight change.  Eyes: Negative for visual disturbance.  Respiratory: Negative for cough, chest tightness and shortness of breath.   Cardiovascular: Negative for chest pain, palpitations and leg swelling.  Gastrointestinal: Negative for abdominal pain and blood in stool.  Neurological: Negative for dizziness, light-headedness and headaches.       Objective:   Physical Exam  Constitutional: He is oriented to person, place, and time. He appears well-developed and well-nourished.  HENT:  Head: Normocephalic and atraumatic.  Eyes: Pupils are equal, round, and reactive to light. EOM are normal.  Neck: No JVD present. Carotid bruit is not present.  Cardiovascular: Normal rate, regular rhythm and normal heart sounds.  No murmur heard. Pulmonary/Chest: Effort normal and breath sounds normal. He has no rales.  Musculoskeletal: He exhibits no edema.  Neurological: He is alert and oriented to person, place, and time.  Skin: Skin is warm and dry.  Psychiatric: He has a normal mood and affect.  Vitals reviewed. good eye contact. Euthymic mood, no SI/HI.  Well healed abdominal scar  Vitals:   04/04/18 1329  BP: (!) 108/58  Pulse: 77  Temp: 98.8 F (37.1 C)  TempSrc: Oral  SpO2: 100%  Weight: 192 lb 6.4 oz (87.3 kg)  Height: '5\' 9"'  (1.753 m)       Assessment & Plan:   [name] is a 72 y.o. male Anemia, unspecified type -  Plan: CBC  - repeat CBC for stability. Continue iron supplementation.   Elevated LFTs -  Plan: Comprehensive metabolic panel  - repeat lfts, prior improved.   Type 2 diabetes mellitus with hypoglycemia without coma, without long-term current use of insulin ([place]) - Plan: Hemoglobin A1c  - check A1c, appears to be tolerating current regimen better.   Situational stress  - possible adjustment disorder. Option of meeting with therapist, but handout given initially. Recommended return to discuss symptoms further next few weeks.   No orders of the defined types were placed in this encounter.  Patient Instructions   For nervous symptoms, you may have some adjustment disorder that can happen after medical illness.  See information below on this condition.  It may also be helpful to meet with a therapist to discuss symptoms further, but I am happy to discuss them further in the next few weeks.  If any worsening of the symptoms, please follow-up sooner with me or the emergency room.    Adjustment Disorder, Adult Adjustment disorder is a group of symptoms that can develop after a stressful life event, such as the loss of a job or serious physical illness. The symptoms can affect how you feel, think, and act. They may interfere with your relationships. Adjustment disorder increases your risk of suicide and substance abuse. If this disorder is not managed early, it can develop into a more serious condition, such as major depressive disorder or post-traumatic stress disorder. What are the causes? This condition happens when you have trouble recovering from or coping with a stressful life event. What increases the risk? You are more likely to develop this condition if:  You have had depression or anxiety.  You are being treated for a long-term (chronic) illness.  You are being treated for an illness that cannot be cured (terminal illness).  You have a family history of mental illness.  What are the signs or symptoms? Symptoms of this condition include:  Extreme trouble doing daily  tasks, such as going to work.  Sadness, depression, or crying spells.  Worrying a lot.  Loss of enjoyment.  Change in appetite or weight.  Feelings of loss or hopelessness.  Thoughts of suicide.  Anxiety, worry, or nervousness.  Trouble sleeping.  Avoiding family and friends.  Fighting or vandalism.  Complaining of feeling sick without being ill.  Feeling dazed or disconnected.  Nightmares.  Trouble sleeping.  Irritability.  Reckless driving.  Poor work Systems analyst.  Ignoring bills.  Symptoms of this condition start within three months of the stressful event. They do not last more than six months, unless the stressful circumstances last longer. Normal grieving after the death of a loved one is not a symptom of this condition. How is this diagnosed? To diagnose this condition, your health care provider will ask about what has happened in your life and how it has affected you. He or she may also ask about your medical history and your use of medicines, alcohol, and other substances. Your health care provider may do a physical exam and order lab tests or other studies. You may be referred to a mental health specialist. How is this treated? Treatment options for this condition include:  Counseling or talk therapy. Talk therapy is usually provided by mental health specialists.  Medicines. Certain medicines may help with depression, anxiety, and sleep.  Support groups. These offer emotional support, advice, and guidance. They are made up of people who have had similar experiences.  Observation and time.  This is sometimes called "watchful waiting." In this treatment, health care providers monitor your health and behavior without other treatment. Adjustment disorder sometimes gets better on its own with time.  Follow these instructions at home:  Take over-the-counter and prescription medicines only as told by your health care provider.  Keep all follow-up visits as  told by your health care provider. This is important. Contact a health care provider if:  Your symptoms do not improve in six months.  Your symptoms get worse. Get help right away if:  You have serious thoughts about hurting yourself or someone else. If you ever feel like you may hurt yourself or others, or have thoughts about taking your own life, get help right away. You can go to your nearest emergency department or call:  Your local emergency services (911 in the [place]  A suicide crisis helpline, such as the [place] at 5091132073. This is open 24 hours a day.  Summary  Adjustment disorder is a group of symptoms that can develop after a stressful life event, such as the loss of a job or serious physical illness. The symptoms can affect how you feel, think, and act. They may interfere with your relationships.  Symptoms of this condition start within three months of the stressful event. They do not last more than six months, unless the stressful circumstances last longer.  Treatment may include talk therapy, medicines, participation in a support group, or observation to see if symptoms improve.  Contact your health care provider if your symptoms get worse or do not improve in six months.  If you ever feel like you may hurt yourself or others, or have thoughts about taking your own life, get help right away. This information is not intended to replace advice given to you by your health care provider. Make sure you discuss any questions you have with your health care provider. Document Released: 04/10/2006 Document Revised: 10/05/2016 Document Reviewed: 10/05/2016 Elsevier Interactive Patient Education  [employer].    if you have lab work done today you will be contacted with your lab results within the next 2 weeks.  If you have not heard from [place] then please contact us. The fastest way to get your results is to register for My Chart.    IF  you received an x-ray today, you will receive an invoice from [facility]. Please contact [facility] at [phone] with questions or concerns regarding your invoice.   IF you received labwork today, you will receive an invoice from [facility]. Please contact [facility] at [phone] with questions or concerns regarding your invoice.   Our billing staff will not be able to assist you with questions regarding bills from these companies.  You will be contacted with the lab results as soon as they are available. The fastest way to get your results is to activate your My Chart account. Instructions are located on the last page of this paperwork. If you have not heard from [place] regarding the results in 2 weeks, please contact this office.       Signed,   [name], MD [facility] [place].  04/04/18 2:08 PM

## 2018-04-05 LAB — COMPREHENSIVE METABOLIC PANEL
ALT: 31 IU/L (ref 0–44)
AST: 36 IU/L (ref 0–40)
Albumin/Globulin Ratio: 1.2 (ref 1.2–2.2)
Albumin: 3.7 g/dL (ref 3.5–4.8)
Alkaline Phosphatase: 95 IU/L (ref 39–117)
BUN/Creatinine Ratio: 11 (ref 10–24)
BUN: 11 mg/dL (ref 8–27)
Bilirubin Total: 1.6 mg/dL — ABNORMAL HIGH (ref 0.0–1.2)
CO2: 19 mmol/L — ABNORMAL LOW (ref 20–29)
Calcium: 8.8 mg/dL (ref 8.6–10.2)
Chloride: 109 mmol/L — ABNORMAL HIGH (ref 96–106)
Creatinine, Ser: 1.02 mg/dL (ref 0.76–1.27)
GFR calc Af Amer: 84 mL/min/{1.73_m2} (ref >59)
GFR calc non Af Amer: 73 mL/min/{1.73_m2} (ref >59)
Globulin, Total: 3 g/dL (ref 1.5–4.5)
Glucose: 81 mg/dL (ref 65–99)
Potassium: 3.8 mmol/L (ref 3.5–5.2)
Sodium: 143 mmol/L (ref 134–144)
Total Protein: 6.7 g/dL (ref 6.0–8.5)

## 2018-04-05 LAB — CBC
Hematocrit: 26.5 % — ABNORMAL LOW (ref 37.5–51.0)
Hemoglobin: 9 g/dL — ABNORMAL LOW (ref 13.0–17.7)
MCH: 33.1 pg — ABNORMAL HIGH (ref 26.6–33.0)
MCHC: 34 g/dL (ref 31.5–35.7)
MCV: 97 fL (ref 79–97)
Platelets: 97 10*3/uL — CL (ref 150–450)
RBC: 2.72 x10E6/uL — CL (ref 4.14–5.80)
RDW: 16.2 % — ABNORMAL HIGH (ref 12.3–15.4)
WBC: 2.8 10*3/uL — ABNORMAL LOW (ref 3.4–10.8)

## 2018-04-05 LAB — HEMOGLOBIN A1C
Est. average glucose Bld gHb Est-mCnc: 108 mg/dL
Hgb A1c MFr Bld: 5.4 % (ref 4.8–5.6)

## 2018-04-09 ENCOUNTER — Other Ambulatory Visit: Payer: Self-pay | Admitting: Family Medicine

## 2018-04-09 ENCOUNTER — Other Ambulatory Visit: Payer: Self-pay

## 2018-04-09 DIAGNOSIS — D696 Thrombocytopenia, unspecified: Secondary | ICD-10-CM

## 2018-04-09 DIAGNOSIS — D649 Anemia, unspecified: Secondary | ICD-10-CM

## 2018-04-09 NOTE — Progress Notes (Signed)
recheck cbc - see lab notes.

## 2018-04-09 NOTE — Patient Outreach (Signed)
[facility]) Care Management  04/09/2018  [name] 12/12/1945 [id number]   Follow up call to [name] to ensure receipt of socialization resources and Forensic psychologist.  [name] confirmed receipt and denied having any questions.  BSW is closing case due to no other social work needs being identified at this time.   [name], BSW Social Worker [phone]

## 2018-04-14 ENCOUNTER — Ambulatory Visit (INDEPENDENT_AMBULATORY_CARE_PROVIDER_SITE_OTHER): Payer: Medicare Other | Admitting: Family Medicine

## 2018-04-14 DIAGNOSIS — D649 Anemia, unspecified: Secondary | ICD-10-CM

## 2018-04-14 DIAGNOSIS — D696 Thrombocytopenia, unspecified: Secondary | ICD-10-CM

## 2018-04-14 NOTE — Progress Notes (Signed)
Lab only visit 

## 2018-04-15 ENCOUNTER — Other Ambulatory Visit: Payer: Self-pay

## 2018-04-15 LAB — CBC WITH DIFFERENTIAL/PLATELET
Basophils Absolute: 0 10*3/uL (ref 0.0–0.2)
Basos: 0 %
EOS (ABSOLUTE): 0 10*3/uL (ref 0.0–0.4)
Eos: 1 %
Hematocrit: 25.9 % — ABNORMAL LOW (ref 37.5–51.0)
Hemoglobin: 9 g/dL — ABNORMAL LOW (ref 13.0–17.7)
Immature Grans (Abs): 0 10*3/uL (ref 0.0–0.1)
Immature Granulocytes: 0 %
Lymphocytes Absolute: 0.8 10*3/uL (ref 0.7–3.1)
Lymphs: 28 %
MCH: 32.5 pg (ref 26.6–33.0)
MCHC: 34.7 g/dL (ref 31.5–35.7)
MCV: 94 fL (ref 79–97)
Monocytes Absolute: 0.3 10*3/uL (ref 0.1–0.9)
Monocytes: 11 %
Neutrophils Absolute: 1.7 10*3/uL (ref 1.4–7.0)
Neutrophils: 60 %
Platelets: 99 10*3/uL — CL (ref 150–450)
RBC: 2.77 x10E6/uL — ABNORMAL LOW (ref 4.14–5.80)
RDW: 16.1 % — ABNORMAL HIGH (ref 12.3–15.4)
WBC: 2.8 10*3/uL — ABNORMAL LOW (ref 3.4–10.8)

## 2018-04-15 NOTE — Patient Outreach (Signed)
[facility]) Care Management  04/15/2018  [name] March 19, 1946 [id number]   RNCM called to follow up.Client denies any specific concerns or issues at this time. He reports he has been in contact with both [employer], [employer]. [place] OTC catalog and encouraged client to call the number on the back of the care to use this benefit. Also reinforced that he can obtain blood pressure cuff through this benefit.   Client is without any questions or concerns and is agreeable to transition to health coach for continued diabetes management.  Plan: transition to health coach for disease management diabetes.  Thea Silversmith, RN, MSN, [place] Coordinator Cell: [phone]

## 2018-04-16 ENCOUNTER — Telehealth: Payer: Self-pay

## 2018-04-16 ENCOUNTER — Inpatient Hospital Stay: Payer: Medicare Other | Attending: Nurse Practitioner

## 2018-04-16 ENCOUNTER — Inpatient Hospital Stay (HOSPITAL_BASED_OUTPATIENT_CLINIC_OR_DEPARTMENT_OTHER): Payer: Medicare Other | Admitting: Nurse Practitioner

## 2018-04-16 ENCOUNTER — Encounter: Payer: Self-pay | Admitting: Nurse Practitioner

## 2018-04-16 VITALS — BP 131/72 | HR 71 | Temp 97.6°F | Resp 17 | Ht 69.0 in | Wt 190.5 lb

## 2018-04-16 DIAGNOSIS — C220 Liver cell carcinoma: Secondary | ICD-10-CM

## 2018-04-16 DIAGNOSIS — D500 Iron deficiency anemia secondary to blood loss (chronic): Secondary | ICD-10-CM

## 2018-04-16 DIAGNOSIS — D61818 Other pancytopenia: Secondary | ICD-10-CM

## 2018-04-16 DIAGNOSIS — Z8505 Personal history of malignant neoplasm of liver: Secondary | ICD-10-CM | POA: Insufficient documentation

## 2018-04-16 DIAGNOSIS — K7460 Unspecified cirrhosis of liver: Secondary | ICD-10-CM | POA: Diagnosis not present

## 2018-04-16 DIAGNOSIS — E119 Type 2 diabetes mellitus without complications: Secondary | ICD-10-CM

## 2018-04-16 LAB — CBC WITH DIFFERENTIAL (CANCER CENTER ONLY)
Basophils Absolute: 0 10*3/uL (ref 0.0–0.1)
Basophils Relative: 1 %
Eosinophils Absolute: 0 10*3/uL (ref 0.0–0.5)
Eosinophils Relative: 1 %
HCT: 27.9 % — ABNORMAL LOW (ref 38.4–49.9)
Hemoglobin: 9.1 g/dL — ABNORMAL LOW (ref 13.0–17.1)
Lymphocytes Relative: 26 %
Lymphs Abs: 0.6 10*3/uL — ABNORMAL LOW (ref 0.9–3.3)
MCH: 32.3 pg (ref 27.2–33.4)
MCHC: 32.6 g/dL (ref 32.0–36.0)
MCV: 99.1 fL — ABNORMAL HIGH (ref 79.3–98.0)
Monocytes Absolute: 0.2 10*3/uL (ref 0.1–0.9)
Monocytes Relative: 10 %
Neutro Abs: 1.5 10*3/uL (ref 1.5–6.5)
Neutrophils Relative %: 62 %
Platelet Count: 80 10*3/uL — ABNORMAL LOW (ref 140–400)
RBC: 2.82 MIL/uL — ABNORMAL LOW (ref 4.20–5.82)
RDW: 16.8 % — ABNORMAL HIGH (ref 11.0–14.6)
WBC Count: 2.3 10*3/uL — ABNORMAL LOW (ref 4.0–10.3)

## 2018-04-16 LAB — FERRITIN: Ferritin: 25 ng/mL (ref 24–336)

## 2018-04-16 NOTE — Telephone Encounter (Signed)
Printed  avs and calender of upcoming appointment. Per 8/28 los 

## 2018-04-16 NOTE — Progress Notes (Signed)
  [facility] OFFICE PROGRESS NOTE   Diagnosis: Hepatocellular carcinoma, cirrhosis  INTERVAL HISTORY:   [name] returns as scheduled.  He underwent laparoscopic cholecystectomy 01/28/2018.  He overall feels well.  He has occasional abdominal pain.  Some fatigue.  He denies bleeding.  Appetite varies.  Objective:  Vital signs in last 24 hours:  Blood pressure 131/72, pulse 71, temperature 97.6 F (36.4 C), temperature source Oral, resp. rate 17, height 5\' 9"  (1.753 m), weight 190 lb 8 oz (86.4 kg), SpO2 100 %.    HEENT: Neck without mass. Lymphatics: No palpable cervical or supraclavicular lymph nodes. Resp: Lungs clear bilaterally. Cardio: Regular rate and rhythm. GI: Abdomen soft and nontender.  No hepatosplenomegaly.  No apparent ascites. Vascular: No leg edema.   Lab Results:  Lab Results  Component Value Date   WBC 2.3 (L) 04/16/2018   HGB 9.1 (L) 04/16/2018   HCT 27.9 (L) 04/16/2018   MCV 99.1 (H) 04/16/2018   PLT 80 (L) 04/16/2018   NEUTROABS 1.5 04/16/2018    Imaging:  No results found.  Medications: I have reviewed the patient's current medications.  Assessment/Plan: 1. Hepatocellular carcinoma, stage I (T1 NX) status post partial left hepatectomy 03/02/2013.  08/31/2013 AFP 2.4.   CT abdomen 08/31/2013 with postoperative changes of partial hepatectomy with small postoperative fluid collection along the resection margin. No definite signs to suggest residual or locally recurrent disease. Several borderline enlarged and minimally enlarged lymph nodes superior to the liver in the juxtapericardiac fat of the lower anterior mediastinum with the largest measuring 11 mm slightly increased compared to the prior study 01/15/2013. Several prominent non-pathologically enlarged upper abdominal ligament lymph nodes similar to the prior study. Small esophageal varices.   CT abdomen 03/03/2012 without evidence of recurrent hepatocellular carcinoma,  stable right lung base nodule  CT the abdomen and pelvis 09/06/2014 without evidence of recurrent hepatocellular carcinoma, slight enlargement of pre-cardiac lymph nodes  CT abdomen/pelvis 09/07/2015 with no findings for recurrent tumor or metastatic disease. Stable small 3 mm right middle lobe pulmonary nodule since 2014. Epicardial lymph node measured 11 mm, previously 13 mm. Stable cirrhotic changes. Fairly significant esophageal varices.  CT 09/03/2016-negative for recurrent hepatocellular carcinoma, changes of cirrhosis with varices  CT 10/15/2017- no evidence of recurrent hepatocellular carcinoma, cirrhosis/varices 2. Cirrhosis. 3. Hepatitis B core antibody positive. 4. Diabetes. 5. Severe microcytic anemia-likely iron deficiency anemia-progressive 06/03/2017. Stool Hemoccult positive 3 06/05/2017. Hemoglobin stable 6. Colonoscopy 12/27/2016-external and internal hemorrhoids. Diverticulosis in the sigmoid, descending and transverse colon. One medium polyp in the distal descending colon. 3 small polyps in the transverse colon. 2 diminutive polyps in the rectum and the proximal ascending colon. Medium-sized lipoma transverse colon. Small lipoma mid ascending colon. Multiplenonbleeding colonic angiodysplastic lesions. Pathology-tubular adenomas, hyperplastic polyps. 7. Pancytopenia secondary to cirrhosis and GI bleeding 8. Laparoscopic cholecystectomy 01/28/2018   Disposition: [name] appears stable.  He remains in clinical remission from hepatocellular carcinoma.  We will follow-up on the AFP from today.  He has pancytopenia secondary to cirrhosis.  Counts overall remain stable.  He will continue oral iron.  He will return for a CBC in 3 months, lab and follow-up in 6 months.  He will contact the office in the interim with any problems.  Plan reviewed with Dr. Benay Spice.    Ned Card ANP/GNP-BC   04/16/2018  9:36 AM

## 2018-04-17 ENCOUNTER — Encounter: Payer: Self-pay | Admitting: Family Medicine

## 2018-04-17 ENCOUNTER — Encounter: Payer: Self-pay | Admitting: *Deleted

## 2018-04-17 ENCOUNTER — Ambulatory Visit (INDEPENDENT_AMBULATORY_CARE_PROVIDER_SITE_OTHER): Payer: Medicare Other | Admitting: Family Medicine

## 2018-04-17 VITALS — BP 124/67 | HR 68 | Temp 98.4°F | Ht 69.0 in | Wt 189.2 lb

## 2018-04-17 DIAGNOSIS — D61818 Other pancytopenia: Secondary | ICD-10-CM

## 2018-04-17 DIAGNOSIS — F329 Major depressive disorder, single episode, unspecified: Secondary | ICD-10-CM | POA: Diagnosis not present

## 2018-04-17 DIAGNOSIS — D509 Iron deficiency anemia, unspecified: Secondary | ICD-10-CM | POA: Diagnosis not present

## 2018-04-17 DIAGNOSIS — R413 Other amnesia: Secondary | ICD-10-CM

## 2018-04-17 DIAGNOSIS — F32A Depression, unspecified: Secondary | ICD-10-CM

## 2018-04-17 LAB — AFP TUMOR MARKER: AFP, Serum, Tumor Marker: 1.5 ng/mL (ref 0.0–8.3)

## 2018-04-17 MED ORDER — SERTRALINE HCL 25 MG PO TABS
25.0000 mg | ORAL_TABLET | Freq: Every day | ORAL | 1 refills | Status: DC
Start: 2018-04-17 — End: 2018-06-11

## 2018-04-17 NOTE — Patient Instructions (Addendum)
Keep follow up in next few weeks with neurology  - I would like you to discuss your memory and mood symptoms at that time as well. Continue aricept same dose for now.   I would recommend trying a low dose antidepressant - zoloft 25mg  once per day for now and recheck in 3 weeks. See other information on depression  I will discuss your blood tests with Dr. Benay Spice.  Continue iron 3 times per day.    Persistent Depressive Disorder Persistent depressive disorder (PDD) is a mental health condition. PDD causes symptoms of low-level depression for 2 years or longer. It may also be called long-term (chronic) depression or dysthymia. PDD may include episodes of more severe depression that last for about 2 weeks (major depressive disorder or MDD). PDD can affect the way you think, feel, and sleep. This condition may also affect your relationships. You may be more likely to get sick if you have PDD. Symptoms of PDD occur for most of the day and may include:  Feeling tired (fatigue).  Low energy.  Eating too much or too little.  Sleeping too much or too little.  Feeling restless or agitated.  Feeling hopeless.  Feeling worthless or guilty.  Feeling worried or nervous (anxiety).  Trouble concentrating or making decisions.  Low self-esteem.  A negative way of looking at things (outlook).  Not being able to have fun or feel pleasure.  Avoiding interacting with people.  Getting angry or annoyed easily (irritability).  Acting aggressive or angry.  Follow these instructions at home: Activity  Go back to your normal activities as told by your doctor.  Exercise regularly as told by your doctor. General instructions  Take over-the-counter and prescription medicines only as told by your doctor.  Do not drink alcohol. Or, limit how much alcohol you drink to no more than 1 drink a day for nonpregnant women and 2 drinks a day for men. One drink equals 12 oz of beer, 5 oz of wine, or 1  oz of hard liquor. Alcohol can affect any antidepressant medicines you are taking. Talk with your doctor about your alcohol use.  Eat a healthy diet and get plenty of sleep.  Find activities that you enjoy each day.  Consider joining a support group. Your doctor may be able to suggest a support group.  Keep all follow-up visits as told by your doctor. This is important. Where to find more information: [employer] on Mental Illness  www.nami.org  U.S. National Institute of Mental Health  https://carter.com/  National Suicide Prevention Lifeline  838-478-6090 252-394-4475). This is free, 24-hour help.  Contact a doctor if:  Your symptoms get worse.  You have new symptoms.  You have trouble sleeping or doing your daily activities. Get help right away if:  You self-harm.  You have serious thoughts about hurting yourself or others.  You see, hear, taste, smell, or feel things that are not there (hallucinate). This information is not intended to replace advice given to you by your health care provider. Make sure you discuss any questions you have with your health care provider. Document Released: 07/18/2015 Document Revised: 03/30/2016 Document Reviewed: 03/30/2016 Elsevier Interactive Patient Education  [employer].    If you have lab work done today you will be contacted with your lab results within the next 2 weeks.  If you have not heard from [place] then please contact us. The fastest way to get your results is to register for My Chart.   IF  you received an x-ray today, you will receive an invoice from [facility]. Please contact [facility] at [phone] with questions or concerns regarding your invoice.   IF you received labwork today, you will receive an invoice from [place]. Please contact [facility] at [phone] with questions or concerns regarding your invoice.   Our billing staff will not be able to assist you with questions  regarding bills from these companies.  You will be contacted with the lab results as soon as they are available. The fastest way to get your results is to activate your My Chart account. Instructions are located on the last page of this paperwork. If you have not heard from [place] regarding the results in 2 weeks, please contact this office.

## 2018-04-17 NOTE — Progress Notes (Signed)
Subjective:    Patient ID: [name], male    DOB: November 19, 1945, 72 y.o.   MRN: [id number]  HPI  [name] is a 72 y.o. male Presents today for: Chief Complaint  Patient presents with  . lab review    follow up lab work   . memmory loss    we as staff noticed that he has been more confused and mixing up the days and dates lately (6-CIT Done)   Here for follow-up.  Most recent was seen August 16.  Did endorse some symptoms of possible adjustment disorder at that time with stressors.  Some concern regarding memory  Memory concerns.: Thought to have some component of stress/adjustment disorder given recent health issues.  Did have a slightly elevated 6 CIT screen last visit, reading of 6.  Today slightly higher with reading of 11.  He is followed by neurology with a history of narcolepsy, OSA, and history of memory impairment.  Memory was discussed in August 2018 with neurology when he was having some forgetfulness.  Noted to have short-term memory loss but not amnestic at that time.  Had a Mini-Mental status exam of 21 out of 30 points.  Has had lifelong excessive daytime sleepiness.  There was a plan at that visit to have a 22-monthfollow-up [facility] nurse [facility].  He has been on Aricept 16mand was seen in March by MeVaughan Browner Plan to continue Aricept for memory at that time at same dose.  Memory score was stable.  MMSE 23 at that time. He feels like memory is the same. Denies new confusion. Has appt with neuro 05/05/18. Still has some stress about not being able to do prior activities, less energy than prior to 2014.  Feels depressed at times, no SI. Annoyed by health issues. Does not want to meet with counselor, bu would like to try med to help with mood.   [facility] 04/17/2018 04/04/2018 02/18/2018 02/11/2018 11/21/2017  Decreased Interest 0 0 0 0 0  Down, Depressed, Hopeless 0 0 0 0 0  PHQ - 2 Score 0 0 0 0 0  Altered sleeping - -  - - -  Tired, decreased energy - - - - -  Change in appetite - - - - -  Feeling bad or failure about yourself  - - - - -  Trouble concentrating - - - - -  Moving slowly or fidgety/restless - - - - -  Suicidal thoughts - - - - -  PHQ-9 Score - - - - -  Difficult doing work/chores - - - - -  Some recent data might be hidden     6CIT Screen 04/17/2018 04/04/2017  What Year? 0 points 0 points  What month? 3 points 0 points  What time? 0 points 0 points  Count back from 20 0 points 0 points  Months in reverse 4 points 4 points  Repeat phrase 4 points 2 points  Total Score 11 6    Anemia: Pancytopenia. CBC obtained August 16, WBC 2.8, RBC 2.72 (down from 3.2), hemoglobin 9.0 (down from 10.0 previously), hematocrit 26.5, platelets 97. He returned for repeat blood work 04/14/2018 with similar readings. Seen by oncology yesterday.  History of hepatocellular carcinoma, status post partial left hepatectomy in 2014.  Noted severe microcytic anemia likely iron deficiency anemia repeat testing with stable hemoglobin.. 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  Based on overall stability of blood work  he was continued on oral iron with plan for repeat CBC in 3 months lab and follow-up in 6 months.  Oncologist Dr. Benay Spice.  No new dark stools, or rectal bleeding.  Taking iron 3 times per day.  Rare dizziness - only noted if leaning down to pick up object. Quickly resolves. No chest pains.   Lab Results  Component Value Date   WBC 2.3 (L) 04/16/2018   HGB 9.1 (L) 04/16/2018   HCT 27.9 (L) 04/16/2018   MCV 99.1 (H) 04/16/2018   PLT 80 (L) 04/16/2018       Patient Active Problem List   Diagnosis Date Noted  . Irregular heart beat 01/28/2018  . Noncompliance with CPAP treatment 08/14/2017  . Excessive daytime sleepiness 04/09/2017  . Snoring 04/09/2017  . Memory impairment of gradual onset 04/09/2017  . Aortic atherosclerosis ([place]) 11/27/2016  . 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 Altered mental status 03/05/2013  . Respiratory failure, post-operative ([place]) 03/02/2013  . Hepatitis B 02/24/2013  . Cancer, hepatocellular ([place]) 02/10/2013  . Liver tumor-bleeding 01/15/2013  . Epidermal cyst 06/18/2012  . OSA (obstructive sleep apnea) 03/13/2012  . Abnormal leg movement 12/12/2011  . Blood pressure elevated 09/06/2011  . Diabetes mellitus ([place]) 09/06/2011  . 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 BPH (benign prostatic hyperplasia) 09/06/2011  . Diverticula of colon 09/06/2011   Past Medical History:  Diagnosis Date  . Cancer [facility])    hepatocellular cancer   . Diabetes mellitus without complication ([place])   . Dyspnea   . Hepatitis B   . Hyperlipidemia   . Hypertension   . Irregular heart beat 01/28/2018  . Narcolepsy    per office visit note of 08/2011   . Sleep apnea    cpap   Past Surgical History:  Procedure Laterality Date  . CHOLECYSTECTOMY N/A 01/28/2018   Procedure: LAPAROSCOPIC CHOLECYSTECTOMY;  Surgeon: [name], MD;  Location: [facility];  Service: General;  Laterality: N/A;  . COLONOSCOPY WITH PROPOFOL N/A 08/27/2017   Procedure: COLONOSCOPY WITH PROPOFOL;  Surgeon: Clarene Essex, MD;  Location: [facility] ENDOSCOPY;  Service: Endoscopy;  Laterality: N/A;  . HOT HEMOSTASIS N/A 08/27/2017   Procedure: HOT HEMOSTASIS (ARGON PLASMA COAGULATION/BICAP);  Surgeon: Clarene Essex, MD;  Location: [facility];  Service: Endoscopy;  Laterality: N/A;  . LAPAROSCOPIC CHOLECYSTECTOMY  01/28/2018  . LAPAROSCOPY  03/02/2013   Procedure: LAPAROSCOPY DIAGNOSTIC;  Surgeon: [name], MD;  Location: [facility] ORS;  Service: General;;  . LIVER ULTRASOUND  03/02/2013   Procedure: LIVER ULTRASOUND;  Surgeon: [name], MD;  Location: [facility];  Service: General;;  . OPEN PARTIAL HEPATECTOMY   03/02/2013   Procedure: OPEN PARTIAL HEPATECTOMY [83];  Surgeon: [name], MD;  Location: [facility] OR[facility];  Service: General;;  DX LAPAROSCOPY, INTRAOPERATIVE LIVER ULTRASOUND,  OPEN PARTIAL HEPATECTOMY  . pinched nerve in back     Allergies  Allergen Reactions  . Ace Inhibitors Swelling and Other (See Comments)    Angioedema - face.    Prior to Admission medications   Medication Sig Start Date End Date Taking? Authorizing Provider  ACCU-CHEK SOFTCLIX LANCETS lancets Once per day testing. 08/05/17  Yes Wendie Agreste, MD  alfuzosin (UROXATRAL) 10 MG 24 hr tablet Take 1 tablet (10 mg total) by mouth at bedtime. 12/11/17  Yes Wendie Agreste, MD  amphetamine-dextroamphetamine (ADDERALL XR) 30 MG 24 hr capsule Take 1 capsule (30 mg total) by mouth every morning. 10/30/17  Yes Ward Givens, NP  blood glucose meter  kit and supplies Dispense based on patient and insurance preference. Use up to four times daily as directed. (FOR ICD-9 250.00, 250.01). 04/25/17  Yes Wendie Agreste, MD  donepezil (ARICEPT) 10 MG tablet Take 1 tablet (10 mg total) by mouth at bedtime. 11/05/17  Yes Dohmeier, Asencion Partridge, MD  ferrous sulfate (KP FERROUS SULFATE) 325 (65 FE) MG tablet Take 325 mg by mouth 3 (three) times daily with meals.    Yes [provider]  glucose blood (ACCU-CHEK AVIVA PLUS) test strip 1 each by Other route as needed for other. Use as instructed - once per day testing for now. 08/05/17  Yes Wendie Agreste, MD  losartan (COZAAR) 25 MG tablet Take 1 tablet (25 mg total) by mouth daily. 11/07/17  Yes Wendie Agreste, MD  metFORMIN (GLUCOPHAGE) 1000 MG tablet Take 1 tablet (1,000 mg total) by mouth 2 (two) times daily with a meal. 11/07/17  Yes Wendie Agreste, MD  metoprolol succinate (TOPROL-XL) 50 MG 24 hr tablet Take 1 tablet (50 mg total) by mouth daily. Take with or immediately following a meal. 11/07/17  Yes Wendie Agreste, MD  pravastatin (PRAVACHOL) 20 MG tablet Take 1 tablet (20 mg total) by mouth at bedtime. 11/07/17  Yes Wendie Agreste, MD  spironolactone (ALDACTONE) 25 MG tablet Take 1 tablet (25 mg total) by mouth 2 (two) times daily. 11/07/17   Yes Wendie Agreste, MD   Social History   Socioeconomic History  . Marital status: Single    Spouse name: Not on file  . Number of children: 2  . Years of education: 15+  . Highest education level: Bachelor's degree (e.g., BA, AB, BS)  Occupational History  . Not on file  Social Needs  . Financial resource strain: Very hard  . Food insecurity:    Worry: Sometimes true    Inability: Sometimes true  . Transportation needs:    Medical: No    Non-medical: No  Tobacco Use  . Smoking status: Former Smoker    Years: 1.00  . Smokeless tobacco: Never Used  Substance and Sexual Activity  . Alcohol use: No  . Drug use: No  . Sexual activity: Yes  Lifestyle  . Physical activity:    Days per week: Not on file    Minutes per session: Not on file  . Stress: To some extent  Relationships  . Social connections:    Talks on phone: Not on file    Gets together: Not on file    Attends religious service: Not on file    Active member of club or organization: Not on file    Attends meetings of clubs or organizations: Not on file    Relationship status: Not on file  . Intimate partner violence:    Fear of current or ex partner: Not on file    Emotionally abused: Not on file    Physically abused: Not on file    Forced sexual activity: Not on file  Other Topics Concern  . Not on file  Social History Narrative   Patient is single and lives alone- became widower when 1 child was 43 year old, divorced 2nd marriage   Has #2 grown children, not in area or involved in his life   Patient is retired.   Patient has a college education.   Patient is right-handed.   Patient drinks maybe two sodas daily.   Review of Systems Per HPI.     Objective:   Physical  Exam  Constitutional: He is oriented to person, place, and time. He appears well-developed and well-nourished.  HENT:  Head: Normocephalic and atraumatic.  Eyes: Pupils are equal, round, and reactive to light. EOM are normal.  Neck:  No JVD present. Carotid bruit is not present.  Cardiovascular: Normal rate, regular rhythm and normal heart sounds.  No murmur heard. Pulmonary/Chest: Effort normal and breath sounds normal. He has no rales.  Musculoskeletal: He exhibits no edema.  Neurological: He is alert and oriented to person, place, and time.  Skin: Skin is warm and dry.  Psychiatric: He has a normal mood and affect. His speech is normal and behavior is normal. He expresses no suicidal ideation.  Vitals reviewed.  Vitals:   04/17/18 1051  BP: 124/67  Pulse: 68  Temp: 98.4 F (36.9 C)  TempSrc: Oral  SpO2: 99%  Weight: 189 lb 3.2 oz (85.8 kg)  Height: '5\' 9"'  (1.753 m)          Assessment & Plan:    [name] is a 72 y.o. male Other pancytopenia (HCC) Iron deficiency anemia, unspecified iron deficiency anemia type  - as above with recent hematology/oncology eval. plan to discuss results/testing intervals with hematology. Continue oral iron suppplement for now.   Depression, unspecified depression type - Plan: sertraline (ZOLOFT) 25 MG tablet Memory difficulties  - history of memory impairment, on Aricept with stable MMSE when seen by neuro prior.  Has follow up in few weeks with neuro.   -suspect some component of depression based on timing of symptoms, versus just adjustment disorder.  Will try low dose zoloft and recheck in 3 weeks. Handout given.   Meds ordered this encounter  Medications  . sertraline (ZOLOFT) 25 MG tablet    Sig: Take 1 tablet (25 mg total) by mouth daily.    Dispense:  30 tablet    Refill:  1   Patient Instructions    Keep follow up in next few weeks with neurology  - I would like you to discuss your memory and mood symptoms at that time as well. Continue aricept same dose for now.   I would recommend trying a low dose antidepressant - zoloft 95m once per day for now and recheck in 3 weeks. See other information on depression  I will discuss your blood tests  with Dr. SBenay Spice  Continue iron 3 times per day.    Persistent Depressive Disorder Persistent depressive disorder (PDD) is a mental health condition. PDD causes symptoms of low-level depression for 2 years or longer. It may also be called long-term (chronic) depression or dysthymia. PDD may include episodes of more severe depression that last for about 2 weeks (major depressive disorder or MDD). PDD can affect the way you think, feel, and sleep. This condition may also affect your relationships. You may be more likely to get sick if you have PDD. Symptoms of PDD occur for most of the day and may include:  Feeling tired (fatigue).  Low energy.  Eating too much or too little.  Sleeping too much or too little.  Feeling restless or agitated.  Feeling hopeless.  Feeling worthless or guilty.  Feeling worried or nervous (anxiety).  Trouble concentrating or making decisions.  Low self-esteem.  A negative way of looking at things (out[place]).  Not being able to have fun or feel pleasure.  Avoiding interacting with people.  Getting angry or annoyed easily (irritability).  Acting aggressive or angry.  Follow these instructions at home: Activity  Go back to your normal activities as told by your doctor.  Exercise regularly as told by your doctor. General instructions  Take over-the-counter and prescription medicines only as told by your doctor.  Do not drink alcohol. Or, limit how much alcohol you drink to no more than 1 drink a day for nonpregnant women and 2 drinks a day for men. One drink equals 12 oz of beer, 5 oz of wine, or 1 oz of hard liquor. Alcohol can affect any antidepressant medicines you are taking. Talk with your doctor about your alcohol use.  Eat a healthy diet and get plenty of sleep.  Find activities that you enjoy each day.  Consider joining a support group. Your doctor may be able to suggest a support group.  Keep all follow-up visits as told by your  doctor. This is important. Where to find more information: [employer] on Mental Illness  www.nami.org  U.S. National Institute of Mental Health  https://carter.com/  National Suicide Prevention Lifeline  (231)387-0289 914-650-6666). This is free, 24-hour help.  Contact a doctor if:  Your symptoms get worse.  You have new symptoms.  You have trouble sleeping or doing your daily activities. Get help right away if:  You self-harm.  You have serious thoughts about hurting yourself or others.  You see, hear, taste, smell, or feel things that are not there (hallucinate). This information is not intended to replace advice given to you by your health care provider. Make sure you discuss any questions you have with your health care provider. Document Released: 07/18/2015 Document Revised: 03/30/2016 Document Reviewed: 03/30/2016 Elsevier Interactive Patient Education  [employer].    If you have lab work done today you will be contacted with your lab results within the next 2 weeks.  If you have not heard from [place] then please contact us. The fastest way to get your results is to register for My Chart.   IF you received an x-ray today, you will receive an invoice from [facility]. Please contact [facility] Radiology at [phone] with questions or concerns regarding your invoice.   IF you received labwork today, you will receive an invoice from [place]. Please contact [facility] at [phone] with questions or concerns regarding your invoice.   Our billing staff will not be able to assist you with questions regarding bills from these companies.  You will be contacted with the lab results as soon as they are available. The fastest way to get your results is to activate your My Chart account. Instructions are located on the last page of this paperwork. If you have not heard from [place] regarding the results in 2 weeks, please contact this office.        Signed,   [name], MD [facility] [place].  04/20/18 12:26 PM

## 2018-04-20 ENCOUNTER — Encounter: Payer: Self-pay | Admitting: Family Medicine

## 2018-05-01 ENCOUNTER — Other Ambulatory Visit: Payer: Self-pay | Admitting: *Deleted

## 2018-05-01 NOTE — Patient Outreach (Signed)
[place] [facility]) Care Management  05/01/2018  [name] [birth year] [id number]   RN Health Coach telephone call to patient.  [employer] compliance verified. Per patient this is not a good time. Patient requested a call back at [phone].   Plan: RN will call patient back within 10 business days  [place] Management [phone]

## 2018-05-01 NOTE — Patient Outreach (Signed)
[place] [facility]) Care Management  05/01/2018  [name] 06-24-1946 [id number]   [place] attempted follow up outreach call to patient at 4:15 as patient requested.  Patient was unavailable. Voicemail had not been set up yet.   Plan: RN will call patient again within 10 business days.  [place] Care Management [phone]

## 2018-05-03 ENCOUNTER — Encounter: Payer: Self-pay | Admitting: Neurology

## 2018-05-05 ENCOUNTER — Ambulatory Visit: Payer: Medicare Other | Admitting: Neurology

## 2018-05-05 ENCOUNTER — Encounter: Payer: Self-pay | Admitting: Neurology

## 2018-05-05 ENCOUNTER — Other Ambulatory Visit: Payer: Self-pay | Admitting: Family Medicine

## 2018-05-05 VITALS — BP 98/58 | HR 67 | Ht 69.0 in | Wt 181.0 lb

## 2018-05-05 DIAGNOSIS — F4489 Other dissociative and conversion disorders: Secondary | ICD-10-CM | POA: Diagnosis not present

## 2018-05-05 DIAGNOSIS — I10 Essential (primary) hypertension: Secondary | ICD-10-CM

## 2018-05-05 DIAGNOSIS — G47411 Narcolepsy with cataplexy: Secondary | ICD-10-CM

## 2018-05-05 DIAGNOSIS — G3184 Mild cognitive impairment, so stated: Secondary | ICD-10-CM | POA: Diagnosis not present

## 2018-05-05 DIAGNOSIS — Z9989 Dependence on other enabling machines and devices: Secondary | ICD-10-CM | POA: Diagnosis not present

## 2018-05-05 DIAGNOSIS — E119 Type 2 diabetes mellitus without complications: Secondary | ICD-10-CM

## 2018-05-05 MED ORDER — AMPHETAMINE-DEXTROAMPHET ER 30 MG PO CP24: ORAL_CAPSULE | ORAL | 0 refills | Status: DC

## 2018-05-05 MED ORDER — DONEPEZIL HCL 10 MG PO TABS: 10.0000 mg | ORAL_TABLET | Freq: Every day | ORAL | 5 refills | Status: DC

## 2018-05-05 NOTE — Progress Notes (Addendum)
SLEEP MEDICINE CLINIC   Provider:  Larey Seat, M D  Primary Care Physician:  Wendie Agreste, MD   Referring Provider: Wendie Agreste, MD  Chief Complaint  Patient presents with  . Follow-up    pt alone, rm 10. pt states that he has had surgeries completd and has not been able to restart CPAP. pt states he thought his memory was getting better, his PCP states going by test he wasnt. today MMSE was 25. 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  HISTORY FROM: patient here alone.  Today's 05 May 2018 and I have the pleasure of seeing [name]. 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  [name] is no longer able to use his CPAP in a meaningful way on his last 6 months download showed that he made a concerted effort over April 2019 after visit with Vaughan Browner.  However this did not last, the residual AHI when using CPAP was 3.0, he had very high air leaks, based on this compliance he cannot get new supplies, and feels CPAP is not in his prime interest. He appears confused and frail. He underwent cholecystectomy earlier this year, stated this ws unrelated to his liver disease.  His MMSE today was performed by RN Mardene [name], and suprisingly scored at higher point count than the last 2 tests.   This would fit  MCI as well as early dementia- a diagnosis I would favor.  He is taking Aricept regularly, he assured me.  He is only driving within his residential area to church or the supermarket, not on high ways, not at night, not in rain. I am concerned that he may forget he shouldn't drive. He hadn't been able to drive when he was diagnosed and  treated for narcolepsy over 10 years ago and had to  retire from operating a fork lift.    MMSE - Mini Mental State Exam 05/05/2018 10/30/2017 04/09/2017  Orientation to time '3 4 3  ' Orientation to Place '5 4 5  ' Registration '3 3 2  ' Attention/ Calculation '4 3 2  ' Recall '2 2 1  ' Language- name 2 objects '2 2 2  ' Language- repeat '1 1 1  ' Language- follow 3 step command '3 1 3  ' Language- read & follow direction 0 1 1  Write a sentence '1 1 1  ' Copy design 1 1 0  Total score '25 23 21       ' [place], NP  03/13/19Mr. [name] is a 72 year old male with a history of obstructive sleep apnea on CPAP and narcolepsy.  He returns today for follow-up.  His CPAP download from December shows that he use his machine 28 out of 30 days for compliance of 93%.  He uses machine greater than 4 hours 25 out of 30 days for compliance of 83%.  On average he uses his machine 6 hours and 23 minutes.  His residual AHI is 2.1 on a pressure of 8 cm use of water with EPR 1.  He has a leak in the 95th percentile at 36.8 L/min.  The patient states that he was unable to use the machine in February.  Reports that he was having other health issues.  Reports that his blood levels was low and they  were trying to find source of blood loss.  Reports that he had to have 2 blood transfusions.  He states that during this time he accumulated a lot of medical bills and therefore had to go work 2 jobs to help pay off this date.  He also states that he does use Adderall and Aricept.  Reports that he was unable to get Adderall recently due to his finances.  Patient reports that his memory has remained stable.  He lives at home alone,is able to complete all ADLs independently, and he operates a motor vehicle without difficulty.  He manages his own finances.  Interval history from 14 August 2017.  [name] who has been a longtime established narcoleptic patient with excessive daytime sleepiness also tested again positive for the presence of obstructive sleep apnea.  A baseline polysomnography was  performed on 15 April 2017 after he endorsed the Epworth sleepiness score at 17 out of 24 possible points.  His AHI was mild at 11.5, but during REM sleep AHI was exacerbated to 34.8 apneas per hour and in supine sleep to 17.2 apneas.  His mild apnea is associated with oxygen desaturation and the total desaturation time exceeded 30 minutes.  He was therefore asked to return for a CPAP titration which was performed on 17 May 2017 and was titrated to only 7 cm water pressure which reduced his apnea to 0.0.  His oxygen nadir increased to 98% saturation, and the patient slept 265 minutes with a large proportion of REM sleep.  I would consider the 7 cm setting ideal for this patient.  He also had some periodic limb movements but they did not lead to significant sleep arousals.  Overall we are meeting today to establish compliance. [name] reports that he developed head and neck pain, that kept him from using CPAP.  I have a Wi-Fi download available that states that he has not used CPAP until 13 December this year, he also did not use it on the 18th, today is a 74 and the current download would only acknowledge a compliance of less than 40%.  When using the CPAP machine he used it for 6 hours and 42 minutes, the set pressure was 8 cmH2O with 1 cm EPR, his residual AHI was 1.5. I do not want to make adjustments to the CPAP settings but I need [name] to use the machine more compliantly.  His physicians have assumed that his neck pain is a an arthritis related issue, it should not be aggravated by using CPAP. I explained to him that compliance is more than 4 hours nightly, and that means 4 out of 5 nights for hours or more. He is unable to grasp the principle- he stated " I used it more than a week this month and every day " -but only 13 out of 30 days. [facility] is his DME-  uses an Eson mask according to my orders, but he reports using a FFM ??? He needs teaching.     04/09/2017. He no longer uses  CPAP, but I'm very interested for him to come back for a sleep test. I'm worried that his AHI is higher than it used to be 10 years ago. [name] also has been narcoleptic, and he reports having short-term memory deficits. He is forgetful, he doesn't feel that it is every day to the same degree. He feels that in the morning when he is not fatigued his memory functions better than in the afternoon or  evening. This is also not every day the same. He endorsed today the Epworth sleepiness score at 17 points, he did not endorse the fatigue severity score or our geriatric depression scale.  Interval history from 02/26/2017. This is the first visit with Mr. Rupert since June 2016, in the meantime he has seen my nurse practitioner times three. Mr. Hochmuth is no longer using CPAP as he don't longer owns a machine. The sleep apnea is currently untreated, but I have no doubt that he still suffers from apnea and that he still snores. He had excessive daytime sleepiness which had been treated with Adderall. Xyrem also became unaffordable to the patient. He had tolerated Xyrem well for almost 8 years until he had to discontinue the medicine. He endorses the Epworth sleepiness score today at 16 points and the fatigue severity score at 30 points. It is clear that he is still excessively daytime sleepy but his only tool at this time is to use Adderall. I would like to retest him for sleep apnea.  The patient is now covered with [employer] in a [employer]. It should be possible for [place] to obtain a home sleep test and re-issue CPAP possibly with an auto titrator.  I have also reviewed Mr. Washington's recent medical history, he had a fall in March of this year which led to some facial laceration. He denies having had cataplectic symptoms that led to the fall, he had stumbled.  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 Excell Seltzer. This tumor  may cause additional fatigue and sleep issues. He feels his memory is slower, not amnestic.   MM: Today 07/30/2016: Mr. Poplaski is a 72 year old male with a history of narcolepsy and obstructive sleep apnea. He returns today for follow-up. The patient reports that he is back on Adderall extended release and is doing well. He denies any significant daytime sleepiness. He is able to complete all ADLs independently. 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 He does state recently he has noticed he is forgetful. States that he can be in a conversation and forget what he was going to say however he is able to recall it eventually. He does not notice any other behavior changes associated with his memory. He states that family nor friends and make any mention of his forgetfulness. The patient does have sleep apnea that is currently not treated because he was unable to afford the CPAP machine. He returns today for an evaluation.    HISTORY 01/31/15 (Jazen Spraggins): ERNEST POPOWSKI is a very pleasant 72 y.o.-year-old Right-handed male, with an underlying medical history of hypertension, hyperlipidemia, diabetes, who presents for followup consultation of his narcolepsy as well as sleep apnea.   01-31-15 : The patient is unaccompanied today. I am seeing this patientfor well over 10 years now and treat his hypersomnia with Adderall and XYREM,  He discontinued XYREM after the drug's price escalated , I had have seen him last on 06/11/2012 , at which time he indicated that he no longer uses a CPAP machine either (?).  He had a  sleep study in 2012 which showed moderate obstructive sleep apnea and was titrated on CPAP of 9 cm of water pressure. He had residual sleep apnea findings. we increased his pressure to 10 cm, which improved his residual AHI. While he was compliant with CPAP at the time he had problems with co-pays and indicated  that he could not afford CPAP.  He was tested for narcolepsy-  At some point he returned a CPAP machine to his home health company. He is willing to get restarted on treatment. He has a history of narcolepsy and indicates no recent cataplectic attacks. He was tried on Provigil which did not help sleepiness. He was last on Xyrem 4.5 g and 3 g each night In November 2015 , and he continued on Adderall XR 30 mg daily. He feels more sleepy since Xyrem stopped. Based on the recent liver surgery will need to review LFT every 3 month if patient stays on XYREM.  He has Insomnia now, on XR adderall, which he never had before. I will refill medications and he does require any refills today.He indicates no new problems. He feels that his adderall only medication regimen is working "good enough ".  Sleep habits are as follows: bedtime varies between 10.30  and 12 pm and he usually is asleep promptly. He watches Tv , lives alone- forgets the time.  He sleeps well, but wakes once or twice for bathroom breaks.  Sleeping through otherwise, he wakes at 6.30 AM, wakes up spontaneously. He naps in daytime, almost daily. He naps after dinner, too.   Sleep medical history and family sleep history:  one other family member with narcolepsy, niece on brother's  side.  Liver surgery in 2016, symptoms of liver swelling, fatigue, cramping. Cancer diagnosis.   Social history:  Retired, disabled, no tobacco use, no ETOH, caffeine - tea twice a day.no coffee, soda twice week.  Review of Systems: Out of a complete 14 system review, the patient complains of only the following symptoms, and all other reviewed systems are negative.  sleepiness.  Forgetfulness.  How likely are you to doze in the following situations: 0 = not likely, 1 = slight chance, 2 = moderate chance, 3 = high chance  Sitting and Reading?2 Watching Television?3 Sitting inactive in a public place (theater or meeting)?3 Lying down in the afternoon when circumstances  permit?3 Sitting and talking to someone?1 Sitting quietly after lunch without alcohol?3 In a car, while stopped for a few minutes in traffic? 0 As a passenger in a car for an hour without a break?2  Total = 17/ 24  Without adderall- he gave me 12 points for sleepiness on adderall. CD 05-05-2018    Epworth score 17 , Fatigue severity score n/a   , depression score n/a    Social History   Socioeconomic History  . Marital status: Single    Spouse name: Not on file  . Number of children: 2  . Years of education: 15+  . Highest education level: Bachelor's degree (e.g., BA, AB, BS)  Occupational History  . Not on file  Social Needs  . Financial resource strain: Very hard  . Food insecurity:    Worry: Sometimes true    Inability: Sometimes true  . Transportation needs:    Medical: No    Non-medical: No  Tobacco Use  . Smoking status: Former Smoker    Years: 1.00  . Smokeless tobacco: Never Used  Substance and Sexual Activity  . Alcohol use: No  . Drug use: No  . Sexual activity: Yes  Lifestyle  . Physical activity:    Days per week: Not on file    Minutes per session: Not on file  . Stress: To some extent  Relationships  . Social connections:    Talks on phone: Not on file  Gets together: Not on file    Attends religious service: Not on file    Active member of club or organization: Not on file    Attends meetings of clubs or organizations: Not on file    Relationship status: Not on file  . Intimate partner violence:    Fear of current or ex partner: Not on file    Emotionally abused: Not on file    Physically abused: Not on file    Forced sexual activity: Not on file  Other Topics Concern  . Not on file  Social History Narrative   Patient is single and lives alone- became widower when 1 child was 71 year old, divorced 2nd marriage   Has #2 grown children, not in area or involved in his life   Patient is retired.   Patient has a college education.   Patient is  right-handed.   Patient drinks maybe two sodas daily.    Family History  Problem Relation Age of Onset  . Dementia Mother   . Cancer Father   . Diabetes Brother   . Hypertension Brother   . Cancer Brother     Past Medical History:  Diagnosis Date  . Cancer [facility])    hepatocellular cancer   . Diabetes mellitus without complication ([place])   . Dyspnea   . Hepatitis B   . Hyperlipidemia   . Hypertension   . Irregular heart beat 01/28/2018  . Narcolepsy    per office visit note of 08/2011   . Sleep apnea    cpap    Past Surgical History:  Procedure Laterality Date  . CHOLECYSTECTOMY N/A 01/28/2018   Procedure: LAPAROSCOPIC CHOLECYSTECTOMY;  Surgeon: [name], MD;  Location: [place];  Service: General;  Laterality: N/A;  . COLONOSCOPY WITH PROPOFOL N/A 08/27/2017   Procedure: COLONOSCOPY WITH PROPOFOL;  Surgeon: Clarene Essex, MD;  Location: [facility];  Service: Endoscopy;  Laterality: N/A;  . HOT HEMOSTASIS N/A 08/27/2017   Procedure: HOT HEMOSTASIS (ARGON PLASMA COAGULATION/BICAP);  Surgeon: Clarene Essex, MD;  Location: [facility];  Service: Endoscopy;  Laterality: N/A;  . LAPAROSCOPIC CHOLECYSTECTOMY  01/28/2018  . LAPAROSCOPY  03/02/2013   Procedure: LAPAROSCOPY DIAGNOSTIC;  Surgeon: [name], MD;  Location: [facility];  Service: General;;  . LIVER ULTRASOUND  03/02/2013   Procedure: LIVER ULTRASOUND;  Surgeon: [name], MD;  Location: [facility];  Service: General;;  . OPEN PARTIAL HEPATECTOMY   03/02/2013   Procedure: OPEN PARTIAL HEPATECTOMY [83];  Surgeon: [name], MD;  Location: [facility];  Service: General;;  DX LAPAROSCOPY, INTRAOPERATIVE LIVER ULTRASOUND, OPEN PARTIAL HEPATECTOMY  . pinched nerve in back      Current Outpatient Medications  Medication Sig Dispense Refill  . ACCU-CHEK SOFTCLIX LANCETS lancets Once per day testing. 100 each 1  . alfuzosin (UROXATRAL) 10 MG 24 hr tablet Take 1 tablet (10 mg total) by mouth at bedtime. 90 tablet 1  .  amphetamine-dextroamphetamine (ADDERALL XR) 30 MG 24 hr capsule Take 1 capsule (30 mg total) by mouth every morning. 30 capsule 0  . blood glucose meter kit and supplies Dispense based on patient and insurance preference. Use up to four times daily as directed. (FOR ICD-9 250.00, 250.01). 1 each 0  . donepezil (ARICEPT) 10 MG tablet Take 1 tablet (10 mg total) by mouth at bedtime. 30 tablet 5  . ferrous sulfate (KP FERROUS SULFATE) 325 (65 FE) MG tablet Take 325 mg by mouth 3 (three) times daily with meals.     [name] Kitchen  glucose blood (ACCU-CHEK AVIVA PLUS) test strip 1 each by Other route as needed for other. Use as instructed - once per day testing for now. 100 each 1  . losartan (COZAAR) 25 MG tablet Take 1 tablet (25 mg total) by mouth daily. 90 tablet 1  . metFORMIN (GLUCOPHAGE) 1000 MG tablet Take 1 tablet (1,000 mg total) by mouth 2 (two) times daily with a meal. 180 tablet 1  . metoprolol succinate (TOPROL-XL) 50 MG 24 hr tablet Take 1 tablet (50 mg total) by mouth daily. Take with or immediately following a meal. 90 tablet 1  . pravastatin (PRAVACHOL) 20 MG tablet Take 1 tablet (20 mg total) by mouth at bedtime. 90 tablet 1  . sertraline (ZOLOFT) 25 MG tablet Take 1 tablet (25 mg total) by mouth daily. 30 tablet 1  . spironolactone (ALDACTONE) 25 MG tablet Take 1 tablet (25 mg total) by mouth 2 (two) times daily. 180 tablet 1   No current facility-administered medications for this visit.     Allergies as of 05/05/2018 - Review Complete 05/05/2018  Allergen Reaction Noted  . Ace inhibitors Swelling and Other (See Comments) 02/19/2012    Vitals: BP (!) 98/58   Pulse 67   Ht '5\' 9"'  (1.753 m)   Wt 181 lb (82.1 kg)   BMI 26.73 kg/m  Last Weight:  Wt Readings from Last 1 Encounters:  05/05/18 181 lb (82.1 kg)   AUQ:JFHL mass index is 26.73 kg/m.     Last Height:   Ht Readings from Last 1 Encounters:  05/05/18 '5\' 9"'  (1.753 m)    Physical exam:  General: The patient is awake, alert and  appears not in acute distress. The patient is groomed. cleanly dressed,  Head: Normocephalic, atraumatic. Neck is supple. Mallampati 5, poor dentition, .  neck circumference: 17.25 . Nasal airflow congestion , Retrognathia is seen.  Cardiovascular:  Regular rate and rhythm - NSR regular, without  murmurs or carotid bruit, and without distended neck veins. Respiratory: Lungs are clear . Skin:  Without evidence of edema. No icterus.  Trunk: BMI is 32. The patient's posture is slumped   Neurologic exam : The patient is awake and alert, oriented to place and time.   Memory subjective  described as impaired- Memory delay is described.  MMSE - Mini Mental State Exam 05/05/2018 10/30/2017 04/09/2017  Orientation to time '3 4 3  ' Orientation to Place '5 4 5  ' Registration '3 3 2  ' Attention/ Calculation '4 3 2  ' Recall '2 2 1  ' Language- name 2 objects '2 2 2  ' Language- repeat '1 1 1  ' Language- follow 3 step command '3 1 3  ' Language- read & follow direction 0 1 1  Write a sentence '1 1 1  ' Copy design 1 1 0  Total score '25 23 21    ' Attention span & concentration ability appears limited.  Speech is non- fluent, with dysphonia. Mood and affect are fatigued,confused- he remains very pleasant but is unable to answer questions well. .   Cranial nerves: Pupils are equal and briskly reactive to light. Facial motor strength is symmetric , his tongue and uvula move midline. Shoulder shrug  Is still symmetrical.  He has hearing loss- this may contribute to his confusion. 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  Motor exam: Normal tone, muscle bulk and symmetric strength in all extremities. He reports pain in the left leg- not steady but paroxysmal. Had fallen on his left hip.  Coordination:  Finger-to-nose maneuver  normal without dysmetria or tremor. Gait and station: Patient walks without assistive device- Strength within normal limits.  Stance is stable and normal. Turns with 4  Steps. No limp.  Deep tendon reflexes: in the upper and lower extremities are symmetric and intact. Down-going babinski.     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 Still has severe hypersomnia, Epworth 17 last visit-  repeated today.    2) OSA, untreated - he is not interested in returnig to treatment, his retesting was confirming apnea, /   4) some shot term memory loss - confused and amnestic.the patient reached a Mini-Mental Status Examination was 25 out of 30 points, his educational level he is a high school graduate and went 1 year to college at [employer]. He used to work as a Freight forwarder. He did not recall any learning disabilities or problems. He was lifelong excessively daytime sleepy.   The patient was advised of the nature of the diagnosed disorder , the treatment options and the  risks for general health and wellness arising from not treating the condition.   I spent more than 30 minutes of face to face time with the patient.  Greater than 50% of time was spent in counseling and coordination of care. We have discussed the diagnosis and differential and I answered the patient's questions.    Plan:  Treatment plan and additional workup :  1) OSA-  Non compliant on CPAP- will not longer follow or this diagnosis- and by now cognitively impaired with difficulties to implement CPAP.   2) Fatigue-He was diagnosed with hepato-cellulary carcinoma- see last visit in Oncology below-  and has lost weight, lost teeth, he is fatigued. This fatigue can be related to insufficient treatment of narcolepsy - and to Hepatitis B or C , which he tested positive for. He has no skin changes, no neuropathy  complaint.    2) Narcolepsy -  Adderall refilled. He has been in financial difficulties, could not afford this med in spring 2019.  3)MCI or early dementia  - Aricept tolerated, but has some GI upset in the beginning. Rv with NP in 4-6  month - MOCA/ MMSE ,  non compliant on CPAP - will d/c therapy,    Larey Seat, MD 5/88/3254, 9:82 AM  Certified in Neurology by ABPN Certified in [place] by [facility] [address] [place], [place] [address]   1. PS : Hepatocellular carcinoma, stage I (T1 NX) status post partial left hepatectomy 03/02/2013.  08/31/2013 AFP 2.4.   CT abdomen 08/31/2013 with postoperative changes of partial hepatectomy with small postoperative fluid collection along the resection margin. No definite signs to suggest residual or locally recurrent disease. Several borderline enlarged and minimally enlarged lymph nodes superior to the liver in the juxtapericardiac fat of the lower anterior mediastinum with the largest measuring 11 mm slightly increased compared to the prior study 01/15/2013. Several prominent non-pathologically enlarged upper abdominal ligament lymph nodes similar to the prior study. Small esophageal varices.   CT abdomen 03/03/2012 without evidence of recurrent hepatocellular carcinoma, stable right lung base nodule  CT the abdomen and pelvis 09/06/2014 without evidence of recurrent hepatocellular carcinoma, slight enlargement of  pre-cardiac lymph nodes  CT abdomen/pelvis 09/07/2015 with no findings for recurrent tumor or metastatic disease. Stable small 3 mm right middle lobe pulmonary nodule since 2014. Epicardial lymph node measured 11 mm, previously 13 mm. Stable cirrhotic changes. Fairly significant esophageal varices.  CT 09/03/2016-negative for recurrent hepatocellular carcinoma, changes of cirrhosis with varices  CT 10/15/2017- no evidence of recurrent hepatocellular carcinoma,  cirrhosis/varices 2. Cirrhosis. 3. Hepatitis B core antibody positive. 4. Diabetes. 5. Severe microcytic anemia-likely iron deficiency anemia-progressive 06/03/2017. Stool Hemoccult positive 3 06/05/2017. Hemoglobin stable 6. Colonoscopy 12/27/2016-external and internal hemorrhoids. Diverticulosis in the sigmoid, descending and transverse colon. One medium polyp in the distal descending colon. 3 small polyps in the transverse colon. 2 diminutive polyps in the rectum and the proximal ascending colon. Medium-sized lipoma transverse colon. Small lipoma mid ascending colon. Multiplenonbleeding colonic angiodysplastic lesions. Pathology-tubular adenomas, hyperplastic polyps. 7. Pancytopenia secondary to cirrhosis and GI bleeding 8. Laparoscopic cholecystectomy 01/28/2018   Disposition: Mr. Kirk appears stable.  He remains in clinical remission from hepatocellular carcinoma.  We will follow-up on the AFP from today.

## 2018-05-05 NOTE — Patient Instructions (Signed)
Dementia Dementia is the loss of two or more brain functions, such as:  Memory.  Decision making.  Behavior.  Speaking.  Thinking.  Problem solving.  There are many types of dementia. The most common type is called progressive dementia. Progressive dementia gets worse with time and it is irreversible. An example of this type of dementia is Alzheimer disease. What are the causes? This condition may be caused by:  Nerve cell damage in the brain.  Genetic mutations.  Certain medicines.  Multiple small strokes.  An infection, such as chronic meningitis.  A metabolic problem, such as vitamin B12 deficiency or thyroid disease.  Pressure on the brain, such as from a tumor or blood clot.  What are the signs or symptoms? Symptoms of this condition include:  Sudden changes in mood.  Depression.  Problems with balance.  Changes in personality.  Poor short-term memory.  Agitation.  Delusions.  Hallucinations.  Having a hard time: ? Speaking thoughts. ? Finding words. ? Solving problems. ? Doing familiar tasks. ? Understanding familiar ideas.  How is this diagnosed? This condition is diagnosed with an assessment by your health care provider. During this assessment, your health care provider will talk with you and your family, friends, or caregivers about your symptoms. A thorough medical history will be taken, and you will have a physical exam and tests. Tests may include:  Lab tests, such as blood or urine tests.  Imaging tests, such as a CT scan, PET scan, or MRI.  A lumbar puncture. This test involves removing and testing a small amount of the fluid that surrounds the brain and spinal cord.  An electroencephalogram (EEG). In this test, small metal discs are used to measure electrical activity in the brain.  Memory tests, cognitive tests, and neuropsychological tests. These tests evaluate brain function.  How is this treated? Treatment depends on the  cause of the dementia. It may involve taking medicines that may help:  To control the dementia.  To slow down the disease.  To manage symptoms.  In some cases, treating the cause of the dementia can improve symptoms, reverse symptoms, or slow down how quickly the dementia gets worse. Your health care provider can help direct you to support groups, organizations, and other health care providers who can help with decisions about your care. Follow these instructions at home: Medicine  Take over-the-counter and prescription medicines only as told by your health care provider.  Avoid taking medicines that can affect thinking, such as pain or sleeping medicines. Lifestyle   Make healthy lifestyle choices: ? Be physically active as told by your health care provider. ? Do not use any tobacco products, such as cigarettes, chewing tobacco, and e-cigarettes. If you need help quitting, ask your health care provider. ? Eat a healthy diet. ? Practice stress-management techniques when you get stressed. ? Stay social.  Drink enough fluid to keep your urine clear or pale yellow.  Make sure to get quality sleep. These tips can help you to get a good night's rest: ? Avoid napping during the day. ? Keep your sleeping area dark and cool. ? Avoid exercising during the few hours before you go to bed. ? Avoid caffeine products in the evening. General instructions  Work with your health care provider to determine what you need help with and what your safety needs are.  If you were given a bracelet that tracks your location, make sure to wear it.  Keep all follow-up visits as told by your   health care provider. This is important. Contact a health care provider if:  You have any new symptoms.  You have problems with choking or swallowing.  You have any symptoms of a different illness. Get help right away if:  You develop a fever.  You have new or worsening confusion.  You have new or  worsening sleepiness.  You have a hard time staying awake.  You or your family members become concerned for your safety. This information is not intended to replace advice given to you by your health care provider. Make sure you discuss any questions you have with your health care provider. Document Released: 01/30/2001 Document Revised: 12/15/2015 Document Reviewed: 05/04/2015 Elsevier Interactive Patient Education  2018 Elsevier Inc.  

## 2018-05-08 ENCOUNTER — Ambulatory Visit: Payer: Medicare Other | Admitting: Family Medicine

## 2018-05-09 ENCOUNTER — Encounter: Payer: Self-pay | Admitting: Family Medicine

## 2018-05-09 ENCOUNTER — Other Ambulatory Visit: Payer: Self-pay

## 2018-05-09 ENCOUNTER — Ambulatory Visit (INDEPENDENT_AMBULATORY_CARE_PROVIDER_SITE_OTHER): Payer: Medicare Other | Admitting: Family Medicine

## 2018-05-09 ENCOUNTER — Other Ambulatory Visit: Payer: Self-pay | Admitting: Family Medicine

## 2018-05-09 ENCOUNTER — Telehealth: Payer: Self-pay | Admitting: Emergency Medicine

## 2018-05-09 VITALS — BP 102/50 | HR 73 | Temp 97.9°F | Ht 69.0 in | Wt 179.0 lb

## 2018-05-09 DIAGNOSIS — I10 Essential (primary) hypertension: Secondary | ICD-10-CM

## 2018-05-09 DIAGNOSIS — R42 Dizziness and giddiness: Secondary | ICD-10-CM

## 2018-05-09 DIAGNOSIS — G3184 Mild cognitive impairment, so stated: Secondary | ICD-10-CM

## 2018-05-09 DIAGNOSIS — D61818 Other pancytopenia: Secondary | ICD-10-CM

## 2018-05-09 MED ORDER — METOPROLOL SUCCINATE ER 25 MG PO TB24: 25.0000 mg | ORAL_TABLET | Freq: Every day | ORAL | 1 refills | Status: DC

## 2018-05-09 NOTE — Telephone Encounter (Addendum)
He verbalized that he is taking this 2-3 times per day.   ----- Message from [name], NP sent at 05/09/2018  4:12 PM EDT ----- Please make sure he is taking oral iron 2-3 times a day.

## 2018-05-09 NOTE — Patient Instructions (Addendum)
Based on last test with neurologist, memory testing was stable/improved. No change in memory medicine for now.   I am sorry you have had to deal with the health issues you have experienced. That can affect mood and still think zoloft may still help. We can see how that med is working in next month. If you would like to meet with a therapist, I am happy to provide you some numbers.   Dizziness may be related to the anemia, but can also be form your medicines. Decrease metoprolol to 25mg  per day.    If you have lab work done today you will be contacted with your lab results within the next 2 weeks.  If you have not heard from [place] then please contact us. The fastest way to get your results is to register for My Chart.   IF you received an x-ray today, you will receive an invoice from [facility]. Please contact [facility] at [phone] with questions or concerns regarding your invoice.   IF you received labwork today, you will receive an invoice from [place]. Please contact [facility] at [phone] with questions or concerns regarding your invoice.   Our billing staff will not be able to assist you with questions regarding bills from these companies.  You will be contacted with the lab results as soon as they are available. The fastest way to get your results is to activate your My Chart account. Instructions are located on the last page of this paperwork. If you have not heard from [place] regarding the results in 2 weeks, please contact this office.

## 2018-05-09 NOTE — Progress Notes (Signed)
Subjective:  By signing my name below, I, [name], attest that this documentation has been prepared under the direction and in the presence of [name], MD. Electronically Signed: [name], [place]. 05/09/2018 , 9:40 AM .  Patient was seen in Room 1 .   Patient ID: [name], male    DOB: 08-26-1945, 71 y.o.   MRN: [id number] Chief Complaint  Patient presents with  . Depression    f/u   . Dizziness   HPI [name] is a 72 y.o. male Here for follow up.   Memory concerns Patient has a history of memory impairment on Aricept, but did have some component of depression at office visit Aug 29th. He was started on Zoloft 25 mg qd. He was seen by neurology on Sept 16th for mild cognitive impairment; plan for follow up in 4-6 months with nurse practitioner. Long time day time somnolence thought due to non compliant treatment with OSA previously; at this point, thought to have difficulty with cognitive impairment to implement CPAP so attempts at therapy discontinued. Notes reviewed, he was still driving, independent with ADL's and discontinued Adderall for narcolepsy.   Patient has noticed memory has improved with Aricept. He's still independent with ADL's. He did have MMSE score 25/30 4 days ago, improved from score of 23 on March 13th, and score of 21 from Aug 2018. He states not using CPAP because it's more trouble than it's worth. He also notes having decreased appetite.   He mentions not feeling any difference with starting sertraline. He still feels frustrated due to going to the hospital multiple times regarding his anemia. He's also upset that he can't do things he used to do. He denies any side effects with sertraline. He declines counseling or meeting with a therapist.    Dizziness He has a history of HTN, reported rare dizziness with leaning down to pick up objects at last visit. He has a history of anemia with pancytopenia. He takes Losartan 25 mg qd,  aldactone 25 mg qd, and Toprol 50 mg qd.   He still notices dizziness with fast positional changes, and notes dizziness is unchanged.   Anemia Lab Results  Component Value Date   WBC 2.3 (L) 04/16/2018   HGB 9.1 (L) 04/16/2018   HCT 27.9 (L) 04/16/2018   MCV 99.1 (H) 04/16/2018   PLT 80 (L) 04/16/2018   He did have hematology evaluation on 8/28; pancytopenia thought to be stable. He was to continue on iron, and repeat CBC in 3 months.    Patient Active Problem List   Diagnosis Date Noted  . 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 Insufficient treatment with nasal CPAP 05/05/2018  . 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 Irregular heart beat 01/28/2018  . Noncompliance with CPAP treatment 08/14/2017  . Excessive daytime sleepiness 04/09/2017  . Snoring 04/09/2017  . Memory impairment of gradual onset 04/09/2017  . Aortic atherosclerosis ([place]) 11/27/2016  . 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 Altered mental status 03/05/2013  . Respiratory failure, post-operative ([place]) 03/02/2013  . Hepatitis B 02/24/2013  . Cancer, hepatocellular ([place]) 02/10/2013  . Liver tumor-bleeding 01/15/2013  . Epidermal cyst 06/18/2012  . OSA (obstructive sleep apnea) 03/13/2012  . Abnormal leg movement 12/12/2011  . Blood pressure elevated 09/06/2011  . Diabetes mellitus ([place]) 09/06/2011  . 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 BPH (benign prostatic hyperplasia) 09/06/2011  . Diverticula of colon  09/06/2011   Past Medical History:  Diagnosis Date  . Cancer [facility])    hepatocellular cancer   . Diabetes mellitus without complication ([place])   . Dyspnea   . Hepatitis B   . Hyperlipidemia   . Hypertension   . Irregular heart beat 01/28/2018  . Narcolepsy    per office visit note of 08/2011   . Sleep apnea    cpap   Past Surgical History:  Procedure Laterality Date  . CHOLECYSTECTOMY N/A 01/28/2018   Procedure:  LAPAROSCOPIC CHOLECYSTECTOMY;  Surgeon: [name], MD;  Location: [place];  Service: General;  Laterality: N/A;  . COLONOSCOPY WITH PROPOFOL N/A 08/27/2017   Procedure: COLONOSCOPY WITH PROPOFOL;  Surgeon: Clarene Essex, MD;  Location: [facility];  Service: Endoscopy;  Laterality: N/A;  . HOT HEMOSTASIS N/A 08/27/2017   Procedure: HOT HEMOSTASIS (ARGON PLASMA COAGULATION/BICAP);  Surgeon: Clarene Essex, MD;  Location[facility];  Service: Endoscopy;  Laterality: N/A;  . LAPAROSCOPIC CHOLECYSTECTOMY  01/28/2018  . LAPAROSCOPY  03/02/2013   Procedure: LAPAROSCOPY DIAGNOSTIC;  Surgeon: [name], MD;  Location: [facility];  Service: General;;  . LIVER ULTRASOUND  03/02/2013   Procedure: LIVER ULTRASOUND;  Surgeon: [name], MD;  Location: [facility];  Service: General;;  . OPEN PARTIAL HEPATECTOMY   03/02/2013   Procedure: OPEN PARTIAL HEPATECTOMY [83];  Surgeon: [name], MD;  Location: [facility];  Service: General;;  DX LAPAROSCOPY, INTRAOPERATIVE LIVER ULTRASOUND, OPEN PARTIAL HEPATECTOMY  . pinched nerve in back     Allergies  Allergen Reactions  . Ace Inhibitors Swelling and Other (See Comments)    Angioedema - face.    Prior to Admission medications   Medication Sig Start Date End Date Taking? Authorizing Provider  ACCU-CHEK SOFTCLIX LANCETS lancets Once per day testing. 08/05/17   Wendie Agreste, MD  alfuzosin (UROXATRAL) 10 MG 24 hr tablet Take 1 tablet (10 mg total) by mouth at bedtime. 12/11/17   Wendie Agreste, MD  amphetamine-dextroamphetamine (ADDERALL XR) 30 MG 24 hr capsule Take daily by mouth with water 30 minutes before breakfast. 05/05/18   Dohmeier, Asencion Partridge, MD  blood glucose meter kit and supplies Dispense based on patient and insurance preference. Use up to four times daily as directed. (FOR ICD-9 250.00, 250.01). 04/25/17   Wendie Agreste, MD  donepezil (ARICEPT) 10 MG tablet Take 1 tablet (10 mg total) by mouth at bedtime. 05/05/18   Dohmeier, Asencion Partridge, MD  ferrous sulfate  (KP FERROUS SULFATE) 325 (65 FE) MG tablet Take 325 mg by mouth 3 (three) times daily with meals.     [provider]  glucose blood (ACCU-CHEK AVIVA PLUS) test strip 1 each by Other route as needed for other. Use as instructed - once per day testing for now. 08/05/17   Wendie Agreste, MD  losartan (COZAAR) 25 MG tablet Take 1 tablet (25 mg total) by mouth daily. 11/07/17   Wendie Agreste, MD  losartan (COZAAR) 25 MG tablet TAKE 1 TABLET BY MOUTH  DAILY 05/05/18   Wendie Agreste, MD  metFORMIN (GLUCOPHAGE) 1000 MG tablet Take 1 tablet (1,000 mg total) by mouth 2 (two) times daily with a meal. 11/07/17   Wendie Agreste, MD  metFORMIN (GLUCOPHAGE) 1000 MG tablet TAKE 1 TABLET BY MOUTH TWO  TIMES DAILY WITH A MEAL 05/05/18   Wendie Agreste, MD  metoprolol succinate (TOPROL-XL) 50 MG 24 hr tablet Take 1 tablet (50 mg total) by mouth daily. Take with or immediately following  a meal. 11/07/17   Wendie Agreste, MD  pravastatin (PRAVACHOL) 20 MG tablet Take 1 tablet (20 mg total) by mouth at bedtime. 11/07/17   Wendie Agreste, MD  sertraline (ZOLOFT) 25 MG tablet Take 1 tablet (25 mg total) by mouth daily. 04/17/18   Wendie Agreste, MD  spironolactone (ALDACTONE) 25 MG tablet Take 1 tablet (25 mg total) by mouth 2 (two) times daily. 11/07/17   Wendie Agreste, MD   Social History   Socioeconomic History  . Marital status: Single    Spouse name: Not on file  . Number of children: 2  . Years of education: 15+  . Highest education level: Bachelor's degree (e.g., BA, AB, BS)  Occupational History  . Not on file  Social Needs  . Financial resource strain: Very hard  . Food insecurity:    Worry: Sometimes true    Inability: Sometimes true  . Transportation needs:    Medical: No    Non-medical: No  Tobacco Use  . Smoking status: Former Smoker    Years: 1.00  . Smokeless tobacco: Never Used  Substance and Sexual Activity  . Alcohol use: No  . Drug use: No  . Sexual  activity: Yes  Lifestyle  . Physical activity:    Days per week: Not on file    Minutes per session: Not on file  . Stress: To some extent  Relationships  . Social connections:    Talks on phone: Not on file    Gets together: Not on file    Attends religious service: Not on file    Active member of club or organization: Not on file    Attends meetings of clubs or organizations: Not on file    Relationship status: Not on file  . Intimate partner violence:    Fear of current or ex partner: Not on file    Emotionally abused: Not on file    Physically abused: Not on file    Forced sexual activity: Not on file  Other Topics Concern  . Not on file  Social History Narrative   Patient is single and lives alone- became widower when 1 child was 62 year old, divorced 2nd marriage   Has #2 grown children, not in area or involved in his life   Patient is retired.   Patient has a college education.   Patient is right-handed.   Patient drinks maybe two sodas daily.   Review of Systems  Constitutional: Positive for appetite change. Negative for fatigue and unexpected weight change.  Eyes: Negative for visual disturbance.  Respiratory: Negative for cough, chest tightness and shortness of breath.   Cardiovascular: Negative for chest pain, palpitations and leg swelling.  Gastrointestinal: Negative for abdominal pain and blood in stool.  Neurological: Positive for dizziness. Negative for light-headedness and headaches.  Psychiatric/Behavioral: Positive for confusion (improved).       Objective:   Physical Exam  Constitutional: He is oriented to person, place, and time. He appears well-developed and well-nourished.  HENT:  Head: Normocephalic and atraumatic.  Eyes: Pupils are equal, round, and reactive to light. EOM are normal.  Neck: No JVD present. Carotid bruit is not present.  Cardiovascular: Normal rate, regular rhythm and normal heart sounds.  No murmur heard. Pulmonary/Chest: Effort  normal and breath sounds normal. He has no rales.  Musculoskeletal: He exhibits no edema.  Neurological: He is alert and oriented to person, place, and time.  Skin: Skin is warm and dry.  Psychiatric: He has a normal mood and affect.  Vitals reviewed.   Vitals:   05/09/18 0909 05/09/18 0913  BP: (!) 104/46 (!) 102/50  Pulse: 73   Temp: 97.9 F (36.6 C)   TempSrc: Oral   SpO2: 99%   Weight: 179 lb (81.2 kg)   Height: _0  (1.753 m)    Orthostatic VS for the past 24 hrs (Last 3 readings):  BP- Lying Pulse- Lying BP- Standing at 0 minutes Pulse- Standing at 0 minutes BP- Standing at 3 minutes Pulse- Standing at 3 minutes  05/09/18 0918 123/67 67 94/56 76 104/64 75       Assessment & Plan:   [name] is a 72 y.o. male Episode of dizziness - Plan: CBC with Differential/Platelet Essential hypertension - Plan: metoprolol succinate (TOPROL-XL) 25 MG 24 hr tablet Other pancytopenia (HCC) - Plan: CBC with Differential/Platelet  -Dizziness may be related to anemia as well as relative low blood pressure.  -Decrease metoprolol dose to 37m qd.   - recheck CBC although stable by previous notes with hematology.  Continue iron.  MCI (mild cognitive impairment)  -Overall stable and improved MMSE at last neurology visit.  Also suspect some depression versus adjustment disorder with his health issues.  Zoloft was started last visit, may need more time to see effectiveness.  Recheck 1 month, option of therapist given as well.  Meds ordered this encounter  Medications  . metoprolol succinate (TOPROL-XL) 25 MG 24 hr tablet    Sig: Take 1 tablet (25 mg total) by mouth daily. Take with or immediately following a meal.    Dispense:  90 tablet    Refill:  1   Patient Instructions   Based on last test with neurologist, memory testing was stable/improved. No change in memory medicine for now.   I am sorry you have had to deal with the health issues you have experienced. That can  affect mood and still think zoloft may still help. We can see how that med is working in next month. If you would like to meet with a therapist, I am happy to provide you some numbers.   Dizziness may be related to the anemia, but can also be form your medicines. Decrease metoprolol to 239mper day.    If you have lab work done today you will be contacted with your lab results within the next 2 weeks.  If you have not heard from [place] please contact us[place]oreaThe fastest way to get your results is to register for My Chart.   IF you received an x-ray today, you will receive an [facility]. Please contact [facility] at [phone] questions or concerns regarding your invoice.   IF you received labwork today, you will receive an invoice from [place] [facility] at [phone] questions or concerns regarding your invoice.   Our billing staff will not be able to assist you with questions regarding bills from these companies.  You will be contacted with the lab results as soon as they are available. The fastest way to get your results is to activate your My Chart account. Instructions are located on the last page of this paperwork. If you have not heard from us[place]orea[place]arding the results in 2 weeks, please contact this office.       I personally performed the services described in this documentation, which was scribed in my presence. The recorded information has been reviewed and considered for accuracy and  completeness, addended by me as needed, and agree with information above.  Signed,   [name], MD Primary Care at [place].  05/11/18 5:43 PM

## 2018-05-10 LAB — CBC WITH DIFFERENTIAL/PLATELET
Basophils Absolute: 0 10*3/uL (ref 0.0–0.2)
Basos: 1 %
EOS (ABSOLUTE): 0 10*3/uL (ref 0.0–0.4)
Eos: 1 %
Hematocrit: 29.8 % — ABNORMAL LOW (ref 37.5–51.0)
Hemoglobin: 9.8 g/dL — ABNORMAL LOW (ref 13.0–17.7)
Immature Grans (Abs): 0 10*3/uL (ref 0.0–0.1)
Immature Granulocytes: 0 %
Lymphocytes Absolute: 0.5 10*3/uL — ABNORMAL LOW (ref 0.7–3.1)
Lymphs: 16 %
MCH: 32 pg (ref 26.6–33.0)
MCHC: 32.9 g/dL (ref 31.5–35.7)
MCV: 97 fL (ref 79–97)
Monocytes Absolute: 0.2 10*3/uL (ref 0.1–0.9)
Monocytes: 8 %
Neutrophils Absolute: 2.2 10*3/uL (ref 1.4–7.0)
Neutrophils: 74 %
Platelets: 98 10*3/uL — CL (ref 150–450)
RBC: 3.06 x10E6/uL — ABNORMAL LOW (ref 4.14–5.80)
RDW: 14.6 % (ref 12.3–15.4)
WBC: 2.9 10*3/uL — ABNORMAL LOW (ref 3.4–10.8)

## 2018-05-11 ENCOUNTER — Encounter: Payer: Self-pay | Admitting: Family Medicine

## 2018-05-14 ENCOUNTER — Other Ambulatory Visit: Payer: Self-pay | Admitting: *Deleted

## 2018-05-14 NOTE — Patient Outreach (Signed)
[place] [facility]) Care Management  05/14/2018  JANICE SEALES [birth year] [id number]   [place] attempted follow up outreach call to patient.  Patient was unavailable. Per voicemail message the mailbox has not been set up yet.  Plan: RN will call patient again within 10 business days.  [place] Care Management [phone]

## 2018-05-20 ENCOUNTER — Other Ambulatory Visit: Payer: Self-pay | Admitting: Pharmacist

## 2018-05-20 NOTE — Patient Outreach (Signed)
[place] [facility]) Care Management  05/20/2018  ZORAN YANKEE 10-08-1945 [id number]  72 y.o. year old male referred to [facility] for medication management.  Follow-up call to patient today regarding transition to compliance packs for medication adherence as discussed with patient in August.  Patient did not answer phone today and does not have a voicemail set up to leave a message.   Plan: I will call patient back again within 3-4 business days.   Ralene Bathe, PharmD, [place] [phone]

## 2018-05-26 ENCOUNTER — Other Ambulatory Visit: Payer: Self-pay | Admitting: Pharmacist

## 2018-05-26 NOTE — Patient Outreach (Signed)
[place] [facility]) Care Management  05/26/2018  [name] September 08, 1945 [id number]  Reason for consult: Medication adherence, set up compliance packs  Unsuccessful telephone call attempt #2 to patient.   -Message left requesting a return call.    Plan:  I will mail patient a letter describing [facility] services.  I will make another outreach attempt to patient within 3-4 business days.   Ralene Bathe, PharmD, [place] [phone]

## 2018-05-27 ENCOUNTER — Other Ambulatory Visit: Payer: Self-pay | Admitting: *Deleted

## 2018-05-27 NOTE — Patient Outreach (Signed)
[place] [facility]) Care Management  05/27/2018  [name] [birth year] [id number]   [place] attempted #3 follow up outreach call to patient.  Patient was unavailable.  Voicemail box not set up.  Plan: RN will close patient within 10 business days. [place] Care Management [phone]

## 2018-05-28 ENCOUNTER — Other Ambulatory Visit: Payer: Self-pay | Admitting: Pharmacist

## 2018-05-28 NOTE — Patient Outreach (Signed)
[place] [facility]) Care Management  [place]  05/28/2018  DAESHAUN SPECHT [birth year] [id number]   Reason for referral: medication assistance  Unsuccessful telephone call attempt #3 to patient.   -Unable to leave message.    Plan:   I will follow-up on 10th business day from opening case. If no response from patient at this time, I will close [facility] case.    Ralene Bathe, PharmD, [place] [phone]

## 2018-06-02 ENCOUNTER — Other Ambulatory Visit: Payer: Self-pay | Admitting: Pharmacist

## 2018-06-02 NOTE — Patient Outreach (Signed)
[place] [facility]) Care Management [place]  06/02/2018  [name] April 07, 1946 [id number]  Reason for referral: assisting with transition to compliance packs  [facility] pharmacy case is being closed due to the following reasons:  We have been unable to establish and/or maintain contact with the patient.  Ralene Bathe, PharmD, [place] [phone]

## 2018-06-10 ENCOUNTER — Other Ambulatory Visit: Payer: Self-pay | Admitting: *Deleted

## 2018-06-10 NOTE — Patient Outreach (Signed)
[place] [facility]) Care Management  06/10/2018  [name] February 24, 1946 [id number]  Case closure: Multiple attempts to establish contact with patient without success. No response from letter mailed to patient. Case is being closed at this time  Plan: [place] will close based on inability to establish contact with patient.   [place] Care Management [phone]

## 2018-06-11 ENCOUNTER — Encounter: Payer: Self-pay | Admitting: Family Medicine

## 2018-06-11 ENCOUNTER — Other Ambulatory Visit: Payer: Self-pay

## 2018-06-11 ENCOUNTER — Ambulatory Visit (INDEPENDENT_AMBULATORY_CARE_PROVIDER_SITE_OTHER): Payer: Medicare Other | Admitting: Family Medicine

## 2018-06-11 VITALS — BP 115/65 | HR 71 | Temp 98.2°F | Ht 69.0 in | Wt 179.2 lb

## 2018-06-11 DIAGNOSIS — E11649 Type 2 diabetes mellitus with hypoglycemia without coma: Secondary | ICD-10-CM

## 2018-06-11 DIAGNOSIS — Z23 Encounter for immunization: Secondary | ICD-10-CM | POA: Diagnosis not present

## 2018-06-11 DIAGNOSIS — I10 Essential (primary) hypertension: Secondary | ICD-10-CM

## 2018-06-11 DIAGNOSIS — D61818 Other pancytopenia: Secondary | ICD-10-CM | POA: Diagnosis not present

## 2018-06-11 DIAGNOSIS — E1165 Type 2 diabetes mellitus with hyperglycemia: Secondary | ICD-10-CM | POA: Diagnosis not present

## 2018-06-11 DIAGNOSIS — E119 Type 2 diabetes mellitus without complications: Secondary | ICD-10-CM

## 2018-06-11 DIAGNOSIS — F32A Depression, unspecified: Secondary | ICD-10-CM

## 2018-06-11 DIAGNOSIS — E785 Hyperlipidemia, unspecified: Secondary | ICD-10-CM

## 2018-06-11 DIAGNOSIS — R6889 Other general symptoms and signs: Secondary | ICD-10-CM

## 2018-06-11 DIAGNOSIS — F329 Major depressive disorder, single episode, unspecified: Secondary | ICD-10-CM

## 2018-06-11 LAB — POCT CBC
Granulocyte percent: 77.8 %G (ref 37–80)
HCT, POC: 27.8 % — AB (ref 29–41)
Hemoglobin: 9.2 g/dL — AB (ref 9.5–13.5)
Lymph, poc: 0.8 (ref 0.6–3.4)
MCH, POC: 32.3 pg — AB (ref 27–31.2)
MCHC: 33.1 g/dL (ref 31.8–35.4)
MCV: 97.5 fL (ref 76–111)
MID (cbc): 0.1 (ref 0–0.9)
MPV: 6.7 fL (ref 0–99.8)
POC Granulocyte: 3.2 (ref 2–6.9)
POC LYMPH PERCENT: 20.6 %L (ref 10–50)
POC MID %: 1.6 %M (ref 0–12)
Platelet Count, POC: 115 10*3/uL — AB (ref 142–424)
RBC: 2.86 M/uL — AB (ref 4.69–6.13)
RDW, POC: 17.8 %
WBC: 4.1 10*3/uL — AB (ref 4.6–10.2)

## 2018-06-11 LAB — GLUCOSE, POCT (MANUAL RESULT ENTRY): POC Glucose: 114 mg/dl — AB (ref 70–99)

## 2018-06-11 MED ORDER — METFORMIN HCL 1000 MG PO TABS: ORAL_TABLET | ORAL | 1 refills | Status: DC

## 2018-06-11 MED ORDER — LOSARTAN POTASSIUM 25 MG PO TABS: 25.0000 mg | ORAL_TABLET | Freq: Every day | ORAL | 1 refills | Status: DC

## 2018-06-11 MED ORDER — SERTRALINE HCL 25 MG PO TABS: 25.0000 mg | ORAL_TABLET | Freq: Every day | ORAL | 1 refills | Status: DC

## 2018-06-11 MED ORDER — PRAVASTATIN SODIUM 20 MG PO TABS: 20.0000 mg | ORAL_TABLET | Freq: Every day | ORAL | 1 refills | Status: DC

## 2018-06-11 MED ORDER — SPIRONOLACTONE 25 MG PO TABS: 25.0000 mg | ORAL_TABLET | Freq: Two times a day (BID) | ORAL | 1 refills | Status: DC

## 2018-06-11 MED ORDER — ALFUZOSIN HCL ER 10 MG PO TB24: 10.0000 mg | ORAL_TABLET | Freq: Every day | ORAL | 1 refills | Status: DC

## 2018-06-11 NOTE — Progress Notes (Signed)
Subjective:  By signing my name below, I, [name], attest that this documentation has been prepared under the direction and in the presence of Edward Ray, MD. Electronically Signed: [name], [name]. 06/11/2018 , 9:27 AM .  Patient was seen in Room 12 .   Patient ID: [name], male    DOB: 1946-07-14, 72 y.o.   MRN: [id number] Chief Complaint  Patient presents with  . chronic Condition    1 m f/u   . Medication Refill    glucose strips,   HPI [name] is a 72 y.o. male Here for follow up from Sept 20th visit. He also received flu shot today.   Memory/mood He's followed by neurology. He had MMSE 25/30 in Sept, which had improved from 23 in March, and 21 from Aug 2018. He has been started on Zoloft 25 mg for depression, and continued on Aricept for memory impairment.   Patient states his mood comes and goes. He denies SI/HI or self harm.   Depression screen [name] 2/9 05/09/2018 04/17/2018 04/04/2018 02/18/2018 02/11/2018  Decreased Interest 0 0 0 0 0  Down, Depressed, Hopeless 0 0 0 0 0  PHQ - 2 Score 0 0 0 0 0  Altered sleeping - - - - -  Tired, decreased energy - - - - -  Change in appetite - - - - -  Feeling bad or failure about yourself  - - - - -  Trouble concentrating - - - - -  Moving slowly or fidgety/restless - - - - -  Suicidal thoughts - - - - -  PHQ-9 Score - - - - -  Difficult doing work/chores - - - - -  Some recent data might be hidden    Anemia with pancytopenia Hematology visit in Aug thought to be stable. He was continued on iron, planned repeat in 3 months. He did have some episodic dizziness with leaning down or fast positional changes. Had decreased metoprolol to 25 mg in Sept. He's followed by hematology/oncology due to history of hepatocellular cancer.   Lab Results  Component Value Date   HGB 9.8 (L) 05/09/2018   HGB 9.1 (L) 04/16/2018   HGB 9.0 (L) 04/14/2018   Lab Results  Component Value Date   PLT 98 (LL)  05/09/2018   PLT 80 (L) 04/16/2018   PLT 99 (LL) 04/14/2018   Lab Results  Component Value Date   WBC 2.9 (L) 05/09/2018   WBC 2.3 (L) 04/16/2018   WBC 2.8 (L) 04/14/2018   Patient reports feeling cold natured, more than usual; as well as feeling decreased energy. He was informed to take 3 iron supplements daily. He denies noticing any [name] in his stool.   Lab Results  Component Value Date   TSH 1.360 05/20/2017    Diabetes His medications were reviewed at prior visit, due to concerns about adherence. When he was seen in July, he was on metformin 1000 mg bid and glipizide 5 mg bid. He had lows down to 48 at that time, and stopped glipizide. He's currently on metformin 1000 mg bid only.   Lab Results  Component Value Date   HGBA1C 5.4 04/04/2018   Patient reports checking his [name] sugar recently, with lowest running around 115-118.    HTN He takes Losartan 25 mg qd, Toprol 25 mg qd, aldactone 25 mg bid and alfuzosin 10 mg qd.   Lab Results  Component Value Date   CREATININE 1.02 04/04/2018  He notes normally not feeling any dizziness; but if he bends down over, he does have some dizziness.   Patient Active Problem List   Diagnosis Date Noted  . 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 Insufficient treatment with nasal CPAP 05/05/2018  . 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 Irregular heart beat 01/28/2018  . Noncompliance with CPAP treatment 08/14/2017  . Excessive daytime sleepiness 04/09/2017  . Snoring 04/09/2017  . Memory impairment of gradual onset 04/09/2017  . Aortic atherosclerosis ([place]) 11/27/2016  . 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 Altered mental status 03/05/2013  . Respiratory failure, post-operative ([name]) 03/02/2013  . Hepatitis B 02/24/2013  . Cancer, hepatocellular ([name]) 02/10/2013  . Liver tumor-bleeding 01/15/2013  . Epidermal cyst 06/18/2012  .  OSA (obstructive sleep apnea) 03/13/2012  . Abnormal leg movement 12/12/2011  . [name] pressure elevated 09/06/2011  . Diabetes mellitus ([name]) 09/06/2011  . 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 BPH (benign prostatic hyperplasia) 09/06/2011  . Diverticula of colon 09/06/2011   Past Medical History:  Diagnosis Date  . Cancer [name])    hepatocellular cancer   . Diabetes mellitus without complication ([name])   . Dyspnea   . Hepatitis B   . Hyperlipidemia   . Hypertension   . Irregular heart beat 01/28/2018  . Narcolepsy    per office visit note of 08/2011   . Sleep apnea    cpap   Past Surgical History:  Procedure Laterality Date  . CHOLECYSTECTOMY N/A 01/28/2018   Procedure: LAPAROSCOPIC CHOLECYSTECTOMY;  Surgeon: [name], MD;  Location: [facility];  Service: General;  Laterality: N/A;  . COLONOSCOPY WITH PROPOFOL N/A 08/27/2017   Procedure: COLONOSCOPY WITH PROPOFOL;  Surgeon: Clarene Essex, MD;  Location: [facility];  Service: Endoscopy;  Laterality: N/A;  . HOT HEMOSTASIS N/A 08/27/2017   Procedure: HOT HEMOSTASIS (ARGON PLASMA COAGULATION/BICAP);  Surgeon: Clarene Essex, MD;  Location: [facility];  Service: Endoscopy;  Laterality: N/A;  . LAPAROSCOPIC CHOLECYSTECTOMY  01/28/2018  . LAPAROSCOPY  03/02/2013   Procedure: LAPAROSCOPY DIAGNOSTIC;  Surgeon: [name], MD;  Location: [facility];  Service: General;;  . LIVER ULTRASOUND  03/02/2013   Procedure: LIVER ULTRASOUND;  Surgeon: [name], MD;  Location: [facility];  Service: General;;  . OPEN PARTIAL HEPATECTOMY   03/02/2013   Procedure: OPEN PARTIAL HEPATECTOMY [83];  Surgeon: [name], MD;  Location: [facility];  Service: General;;  DX LAPAROSCOPY, INTRAOPERATIVE LIVER ULTRASOUND, OPEN PARTIAL HEPATECTOMY  . pinched nerve in back     Allergies  Allergen Reactions  . Ace Inhibitors Swelling and Other (See Comments)    Angioedema - face.    Prior to Admission medications   Medication Sig Start Date End Date Taking? Authorizing Provider    ACCU-CHEK SOFTCLIX LANCETS lancets Once per day testing. 08/05/17   Wendie Agreste, MD  alfuzosin (UROXATRAL) 10 MG 24 hr tablet Take 1 tablet (10 mg total) by mouth at bedtime. 12/11/17   Wendie Agreste, MD  amphetamine-dextroamphetamine (ADDERALL XR) 30 MG 24 hr capsule Take daily by mouth with water 30 minutes before breakfast. 05/05/18   Dohmeier, Asencion Partridge, MD  [name] glucose meter kit and supplies Dispense based on patient and insurance preference. Use up to four times daily as directed. (FOR ICD-9 250.00, 250.01). 04/25/17   Wendie Agreste, MD  donepezil (ARICEPT) 10 MG tablet Take 1 tablet (10 mg total) by mouth at bedtime. 05/05/18   Dohmeier, Asencion Partridge,  MD  ferrous sulfate (KP FERROUS SULFATE) 325 (65 FE) MG tablet Take 325 mg by mouth 3 (three) times daily with meals.     [provider]  glucose [name] (ACCU-CHEK AVIVA PLUS) test strip 1 each by Other route as needed for other. Use as instructed - once per day testing for now. 08/05/17   Wendie Agreste, MD  losartan (COZAAR) 25 MG tablet Take 1 tablet (25 mg total) by mouth daily. 11/07/17   Wendie Agreste, MD  metFORMIN (GLUCOPHAGE) 1000 MG tablet TAKE 1 TABLET BY MOUTH TWO  TIMES DAILY WITH A MEAL 05/05/18   Wendie Agreste, MD  metoprolol succinate (TOPROL-XL) 25 MG 24 hr tablet Take 1 tablet (25 mg total) by mouth daily. Take with or immediately following a meal. 05/09/18   Wendie Agreste, MD  pravastatin (PRAVACHOL) 20 MG tablet Take 1 tablet (20 mg total) by mouth at bedtime. 11/07/17   Wendie Agreste, MD  sertraline (ZOLOFT) 25 MG tablet Take 1 tablet (25 mg total) by mouth daily. 04/17/18   Wendie Agreste, MD  spironolactone (ALDACTONE) 25 MG tablet TAKE 1 TABLET BY MOUTH TWICE DAILY 05/12/18   Wendie Agreste, MD   Social History   Socioeconomic History  . Marital status: Single    Spouse name: Not on file  . Number of children: 2  . Years of education: 15+  . Highest education level: Bachelor's degree  (e.g., BA, AB, BS)  Occupational History  . Not on file  Social Needs  . Financial resource strain: Very hard  . Food insecurity:    Worry: Sometimes true    Inability: Sometimes true  . Transportation needs:    Medical: No    Non-medical: No  Tobacco Use  . Smoking status: Former Smoker    Years: 1.00  . Smokeless tobacco: Never Used  Substance and Sexual Activity  . Alcohol use: No  . Drug use: No  . Sexual activity: Yes  Lifestyle  . Physical activity:    Days per week: Not on file    Minutes per session: Not on file  . Stress: To some extent  Relationships  . Social connections:    Talks on phone: Not on file    Gets together: Not on file    Attends religious service: Not on file    Active member of club or organization: Not on file    Attends meetings of clubs or organizations: Not on file    Relationship status: Not on file  . Intimate partner violence:    Fear of current or ex partner: Not on file    Emotionally abused: Not on file    Physically abused: Not on file    Forced sexual activity: Not on file  Other Topics Concern  . Not on file  Social History Narrative   Patient is single and lives alone- became widower when 1 child was 75 year old, divorced 2nd marriage   Has #2 grown children, not in area or involved in his life   Patient is retired.   Patient has a college education.   Patient is right-handed.   Patient drinks maybe two sodas daily.   Review of Systems  Constitutional: Negative for fatigue and unexpected weight change.  Eyes: Negative for visual disturbance.  Respiratory: Negative for cough, chest tightness and shortness of breath.   Cardiovascular: Negative for chest pain, palpitations and leg swelling.  Gastrointestinal: Negative for abdominal pain and [name] in  stool.  Neurological: Positive for dizziness (with fast positional changes). Negative for light-headedness and headaches.       Objective:   Physical Exam  Constitutional: He  is oriented to person, place, and time. He appears well-developed and well-nourished.  HENT:  Head: Normocephalic and atraumatic.  Eyes: Pupils are equal, round, and reactive to light. EOM are normal.  Neck: No JVD present. Carotid bruit is not present.  Cardiovascular: Normal rate, regular rhythm and normal heart sounds.  No murmur heard. Pulmonary/Chest: Effort normal and breath sounds normal. He has no rales.  Musculoskeletal: He exhibits no edema.  Neurological: He is alert and oriented to person, place, and time.  Skin: Skin is warm and dry.  Psychiatric: He has a normal mood and affect.  Vitals reviewed.   Vitals:   06/11/18 0826  BP: 115/65  Pulse: 71  Temp: 98.2 F (36.8 C)  TempSrc: Oral  SpO2: 100%  Weight: 179 lb 3.2 oz (81.3 kg)  Height: '5\' 9"'  (1.753 m)   Results for orders placed or performed in visit on 06/11/18  POCT CBC  Result Value Ref Range   WBC 4.1 (A) 4.6 - 10.2 K/uL   Lymph, poc 0.8 0.6 - 3.4   POC LYMPH PERCENT 20.6 10 - 50 %L   MID (cbc) 0.1 0 - 0.9   POC MID % 1.6 0 - 12 %M   POC Granulocyte 3.2 2 - 6.9   Granulocyte percent 77.8 37 - 80 %G   RBC 2.86 (A) 4.69 - 6.13 M/uL   Hemoglobin 9.2 (A) 9.5 - 13.5 g/dL   HCT, POC 27.8 (A) 29 - 41 %   MCV 97.5 76 - 111 fL   MCH, POC 32.3 (A) 27 - 31.2 pg   MCHC 33.1 31.8 - 35.4 g/dL   RDW, POC 17.8 %   Platelet Count, POC 115 (A) 142 - 424 K/uL   MPV 6.7 0 - 99.8 fL  POCT glucose (manual entry)  Result Value Ref Range   POC Glucose 114 (A) 70 - 99 mg/dl        Assessment & Plan:   BERTRUM HELMSTETTER is a 72 y.o. male Other pancytopenia ([place]) - Plan: POCT CBC  -Hemoglobin slightly lower but other cell lines looked improved.  Continue iron supplement 3 times per day, recheck levels in 1 month.  RTC precautions if new symptoms.  Has hematology follow-up planned as well.  Type 2 diabetes mellitus with hypoglycemia without coma, without long-term current use of insulin ([facility]) - Plan:  Microalbumin/Creatinine Ratio, Urine, POCT glucose (manual entry)  -Tolerating metformin without hypoglycemia.  Improved off sulfonylurea.  Plan on recheck A1c in 1 month  Cold intolerance - Plan: TSH  -Screen with TSH, but [name] be related to anemia.  Treatment as above  Essential hypertension - Plan: losartan (COZAAR) 25 MG tablet, spironolactone (ALDACTONE) 25 MG tablet  -Stable, stop metoprolol for now as that [name] be contributing to lightheadedness.  Continue losartan, spironolactone.  Hyperlipidemia, unspecified hyperlipidemia type - Plan: pravastatin (PRAVACHOL) 20 MG tablet  -Continue same dose, consider fasting labs next visit.  Depression, unspecified depression type - Plan: sertraline (ZOLOFT) 25 MG tablet  -Tolerating current dose of Zoloft, no changes for now.  Meds ordered this encounter  Medications  . alfuzosin (UROXATRAL) 10 MG 24 hr tablet    Sig: Take 1 tablet (10 mg total) by mouth at bedtime.    Dispense:  90 tablet    Refill:  1  .  losartan (COZAAR) 25 MG tablet    Sig: Take 1 tablet (25 mg total) by mouth daily.    Dispense:  90 tablet    Refill:  1  . metFORMIN (GLUCOPHAGE) 1000 MG tablet    Sig: TAKE 1 TABLET BY MOUTH TWO  TIMES DAILY WITH A MEAL    Dispense:  180 tablet    Refill:  1  . pravastatin (PRAVACHOL) 20 MG tablet    Sig: Take 1 tablet (20 mg total) by mouth at bedtime.    Dispense:  90 tablet    Refill:  1  . sertraline (ZOLOFT) 25 MG tablet    Sig: Take 1 tablet (25 mg total) by mouth daily.    Dispense:  30 tablet    Refill:  1  . spironolactone (ALDACTONE) 25 MG tablet    Sig: Take 1 tablet (25 mg total) by mouth 2 (two) times daily.    Dispense:  180 tablet    Refill:  1   Patient Instructions    Stop metoprolol at this time as it [name] be contributing to lightheadedness.  No other change in medication today.  Please follow-up in the next 1 month for repeat diabetes testing and can repeat [name] counts at that time as well. Continue iron  3 times per day.   Return to the clinic or go to the nearest emergency room if any of your symptoms worsen or new symptoms occur.  If you have lab work done today you will be contacted with your lab results within the next 2 weeks.  If you have not heard from [place] then please contact us. The fastest way to get your results is to register for My Chart.   IF you received an x-[name] today, you will receive an invoice from [facility]. Please contact [facility] at [phone] with questions or concerns regarding your invoice.   IF you received labwork today, you will receive an invoice from [place]. Please contact [facility] at [phone] with questions or concerns regarding your invoice.   Our billing staff will not be able to assist you with questions regarding bills from these companies.  You will be contacted with the lab results as soon as they are available. The fastest way to get your results is to activate your My Chart account. Instructions are located on the last page of this paperwork. If you have not heard from [place] regarding the results in 2 weeks, please contact this office.       I personally performed the services described in this documentation, which was scribed in my presence. The recorded information has been reviewed and considered for accuracy and completeness, addended by me as needed, and agree with information above.  Signed,   Edward Ray, MD [facility].  06/11/18 10:20 AM

## 2018-06-11 NOTE — Patient Instructions (Addendum)
  Stop metoprolol at this time as it may be contributing to lightheadedness.  No other change in medication today.  Please follow-up in the next 1 month for repeat diabetes testing and can repeat blood counts at that time as well. Continue iron 3 times per day.   Return to the clinic or go to the nearest emergency room if any of your symptoms worsen or new symptoms occur.  If you have lab work done today you will be contacted with your lab results within the next 2 weeks.  If you have not heard from [place] then please contact us. The fastest way to get your results is to register for My Chart.   IF you received an x-ray today, you will receive an invoice from [facility]. Please contact [facility] Radiology at [phone] with questions or concerns regarding your invoice.   IF you received labwork today, you will receive an invoice from [place]. Please contact [facility] at [phone] with questions or concerns regarding your invoice.   Our billing staff will not be able to assist you with questions regarding bills from these companies.  You will be contacted with the lab results as soon as they are available. The fastest way to get your results is to activate your My Chart account. Instructions are located on the last page of this paperwork. If you have not heard from [place] regarding the results in 2 weeks, please contact this office.

## 2018-06-12 LAB — MICROALBUMIN / CREATININE URINE RATIO
Creatinine, Urine: 204.1 mg/dL
Microalb/Creat Ratio: 1.6 mg/g creat (ref 0.0–30.0)
Microalbumin, Urine: 3.3 ug/mL

## 2018-06-12 LAB — TSH: TSH: 0.745 u[IU]/mL (ref 0.450–4.500)

## 2018-06-17 ENCOUNTER — Ambulatory Visit: Payer: Self-pay | Admitting: *Deleted

## 2018-06-17 NOTE — Telephone Encounter (Signed)
Patient reports he fell in the bathroom on Sunday night and hit his R side. Patient thought he would improve but he is still sore. He thinks he needs to be seen. Appointment scheduled for tomorrow morning. Strict instructions given- if he has any changes in status- he should go to ED. He states understanding.  Reason for Disposition . [1] MODERATE back pain (e.g., interferes with normal activities) AND [2] present > 3 days  Answer Assessment - Initial Assessment Questions 1. ONSET: "When did the pain begin?"      Patient fell Sunday night 2. LOCATION: "Where does it hurt?" (upper, mid or lower back)     Area- between R side and back 3. SEVERITY: "How bad is the pain?"  (e.g., Scale 1-10; mild, moderate, or severe)   - MILD (1-3): doesn't interfere with normal activities    - MODERATE (4-7): interferes with normal activities or awakens from sleep    - SEVERE (8-10): excruciating pain, unable to do any normal activities      When patient moves he feels a stinging effect- hurts 4. PATTERN: "Is the pain constant?" (e.g., yes, no; constant, intermittent)      Pain is more when he moves 5. RADIATION: "Does the pain shoot into your legs or elsewhere?"     no 6. CAUSE:  "What do you think is causing the back pain?"      fell 7. BACK OVERUSE:  "Any recent lifting of heavy objects, strenuous work or exercise?"     Patient fell in bathroom 8. MEDICATIONS: "What have you taken so far for the pain?" (e.g., nothing, acetaminophen, NSAIDS)     no 9. NEUROLOGIC SYMPTOMS: "Do you have any weakness, numbness, or problems with bowel/bladder control?"     Some pain with deep breath 10. OTHER SYMPTOMS: "Do you have any other symptoms?" (e.g., fever, abdominal pain, burning with urination, blood in urine)       Swelling at sight- some bruising 11. PREGNANCY: "Is there any chance you are pregnant?" (e.g., yes, no; LMP)       n/a  Protocols used: BACK PAIN-A-AH

## 2018-06-18 ENCOUNTER — Other Ambulatory Visit: Payer: Self-pay

## 2018-06-18 ENCOUNTER — Ambulatory Visit (INDEPENDENT_AMBULATORY_CARE_PROVIDER_SITE_OTHER): Payer: Medicare Other

## 2018-06-18 ENCOUNTER — Ambulatory Visit (INDEPENDENT_AMBULATORY_CARE_PROVIDER_SITE_OTHER): Payer: Medicare Other | Admitting: Emergency Medicine

## 2018-06-18 ENCOUNTER — Encounter: Payer: Self-pay | Admitting: Emergency Medicine

## 2018-06-18 VITALS — BP 110/50 | HR 104 | Temp 98.4°F | Resp 16 | Ht 69.0 in | Wt 174.4 lb

## 2018-06-18 DIAGNOSIS — S2231XA Fracture of one rib, right side, initial encounter for closed fracture: Secondary | ICD-10-CM | POA: Diagnosis not present

## 2018-06-18 DIAGNOSIS — W19XXXA Unspecified fall, initial encounter: Secondary | ICD-10-CM

## 2018-06-18 DIAGNOSIS — R0781 Pleurodynia: Secondary | ICD-10-CM | POA: Diagnosis not present

## 2018-06-18 MED ORDER — TRAMADOL HCL 50 MG PO TABS: 50.0000 mg | ORAL_TABLET | Freq: Three times a day (TID) | ORAL | 0 refills | Status: DC | PRN

## 2018-06-18 NOTE — Patient Instructions (Addendum)
If you have lab work done today you will be contacted with your lab results within the next 2 weeks.  If you have not heard from [place] then please contact us. The fastest way to get your results is to register for My Chart.   IF you received an x-ray today, you will receive an invoice from [facility]. Please contact [facility] at [phone] with questions or concerns regarding your invoice.   IF you received labwork today, you will receive an invoice from [place]. Please contact [facility] at [phone] with questions or concerns regarding your invoice.   Our billing staff will not be able to assist you with questions regarding bills from these companies.  You will be contacted with the lab results as soon as they are available. The fastest way to get your results is to activate your My Chart account. Instructions are located on the last page of this paperwork. If you have not heard from [place] regarding the results in 2 weeks, please contact this office.     Rib Contusion A rib contusion is a deep bruise on your rib area. Contusions are the result of a blunt trauma that causes bleeding and injury to the tissues under the skin. A rib contusion may involve bruising of the ribs and of the skin and muscles in the area. The skin overlying the contusion may turn blue, purple, or yellow. Minor injuries will give you a painless contusion, but more severe contusions may stay painful and swollen for a few weeks. What are the causes? A contusion is usually caused by a blow, trauma, or direct force to an area of the body. This often occurs while playing contact sports. What are the signs or symptoms?  Swelling and redness of the injured area.  Discoloration of the injured area.  Tenderness and soreness of the injured area.  Pain with or without movement. How is this diagnosed? The diagnosis can be made by taking a medical history and performing a physical exam. An X-ray, CT  scan, or MRI may be needed to determine if there were any associated injuries, such as broken bones (fractures) or internal injuries. How is this treated? Often, the best treatment for a rib contusion is rest. Icing or applying cold compresses to the injured area may help reduce swelling and inflammation. Deep breathing exercises may be recommended to reduce the risk of partial lung collapse and pneumonia. Over-the-counter or prescription medicines may also be recommended for pain control. Follow these instructions at home:  Apply ice to the injured area: ? Put ice in a plastic bag. ? Place a towel between your skin and the bag. ? Leave the ice on for 20 minutes, 2-3 times per day.  Take medicines only as directed by your health care provider.  Rest the injured area. Avoid strenuous activity and any activities or movements that cause pain. Be careful during activities and avoid bumping the injured area.  Perform deep-breathing exercises as directed by your health care provider.  Do not lift anything that is heavier than 5 lb (2.3 kg) until your health care provider approves.  Do not use any tobacco products, including cigarettes, chewing tobacco, or electronic cigarettes. If you need help quitting, ask your health care provider. Contact a health care provider if:  You have increased bruising or swelling.  You have pain that is not controlled with treatment.  You have a fever. Get help right away if:  You have difficulty breathing or shortness  of breath.  You develop a continual cough, or you cough up thick or bloody sputum.  You feel sick to your stomach (nauseous), you throw up (vomit), or you have abdominal pain. This information is not intended to replace advice given to you by your health care provider. Make sure you discuss any questions you have with your health care provider. Document Released: 05/01/2001 Document Revised: 01/12/2016 Document Reviewed: 05/18/2014 Elsevier  Interactive Patient Education  2018 [employer].  Rib Fracture A rib fracture is a break or crack in one of the bones of the ribs. The ribs are like a cage that goes around your upper chest. A broken or cracked rib is often painful, but most do not cause other problems. Most rib fractures heal on their own in 1-3 months. Follow these instructions at home:  Avoid activities that cause pain to the injured area. Protect your injured area.  Slowly increase activity as told by your doctor.  Take medicine as told by your doctor.  Put ice on the injured area for the first 1-2 days after you have been treated or as told by your doctor. ? Put ice in a plastic bag. ? Place a towel between your skin and the bag. ? Leave the ice on for 15-20 minutes at a time, every 2 hours while you are awake.  Do deep breathing as told by your doctor. You may be told to: ? Take deep breaths many times a day. ? Cough many times a day while hugging a pillow. ? Use a device (incentive spirometer) to perform deep breathing many times a day.  Drink enough fluids to keep your pee (urine) clear or pale yellow.  Do not wear a rib belt or binder. These do not allow you to breathe deeply. Get help right away if:  You have a fever.  You have trouble breathing.  You cannot stop coughing.  You cough up thick or bloody spit (mucus).  You feel sick to your stomach (nauseous), throw up (vomit), or have belly (abdominal) pain.  Your pain gets worse and medicine does not help. This information is not intended to replace advice given to you by your health care provider. Make sure you discuss any questions you have with your health care provider. Document Released: 05/15/2008 Document Revised: 01/12/2016 Document Reviewed: 10/08/2012 Elsevier Interactive Patient Education  [employer].

## 2018-06-18 NOTE — Progress Notes (Signed)
[name] 72 y.o.   Chief Complaint  Patient presents with  . Fall    on Sunday night, hit right side,  "got up to take pill, turned right in the room instead of the left, and that's when I went down."    HISTORY OF PRESENT ILLNESS: This is a 72 y.o. male fell 3 days ago and injured right posterior lower rib cage.  Denies any other injuries.  Denies syncope.  No other significant symptoms.  HPI   Prior to Admission medications   Medication Sig Start Date End Date Taking? Authorizing Provider  ACCU-CHEK SOFTCLIX LANCETS lancets Once per day testing. 08/05/17  Yes Wendie Agreste, MD  alfuzosin (UROXATRAL) 10 MG 24 hr tablet Take 1 tablet (10 mg total) by mouth at bedtime. 06/11/18  Yes Wendie Agreste, MD  amphetamine-dextroamphetamine (ADDERALL XR) 30 MG 24 hr capsule Take daily by mouth with water 30 minutes before breakfast. 05/05/18  Yes Dohmeier, Asencion Partridge, MD  blood glucose meter kit and supplies Dispense based on patient and insurance preference. Use up to four times daily as directed. (FOR ICD-9 250.00, 250.01). 04/25/17  Yes Wendie Agreste, MD  donepezil (ARICEPT) 10 MG tablet Take 1 tablet (10 mg total) by mouth at bedtime. 05/05/18  Yes Dohmeier, Asencion Partridge, MD  ferrous sulfate (KP FERROUS SULFATE) 325 (65 FE) MG tablet Take 325 mg by mouth 3 (three) times daily with meals.    Yes [provider]  glucose blood (ACCU-CHEK AVIVA PLUS) test strip 1 each by Other route as needed for other. Use as instructed - once per day testing for now. 08/05/17  Yes Wendie Agreste, MD  losartan (COZAAR) 25 MG tablet Take 1 tablet (25 mg total) by mouth daily. 06/11/18  Yes Wendie Agreste, MD  metFORMIN (GLUCOPHAGE) 1000 MG tablet TAKE 1 TABLET BY MOUTH TWO  TIMES DAILY WITH A MEAL 06/11/18  Yes Wendie Agreste, MD  pravastatin (PRAVACHOL) 20 MG tablet Take 1 tablet (20 mg total) by mouth at bedtime. 06/11/18  Yes Wendie Agreste, MD  sertraline (ZOLOFT) 25 MG tablet Take  1 tablet (25 mg total) by mouth daily. 06/11/18  Yes Wendie Agreste, MD  spironolactone (ALDACTONE) 25 MG tablet Take 1 tablet (25 mg total) by mouth 2 (two) times daily. 06/11/18  Yes Wendie Agreste, MD    Allergies  Allergen Reactions  . Ace Inhibitors Swelling and Other (See Comments)    Angioedema - face.     Patient Active Problem List   Diagnosis Date Noted  . 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 Insufficient treatment with nasal CPAP 05/05/2018  . 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 Irregular heart beat 01/28/2018  . Noncompliance with CPAP treatment 08/14/2017  . Excessive daytime sleepiness 04/09/2017  . Snoring 04/09/2017  . Memory impairment of gradual onset 04/09/2017  . Aortic atherosclerosis ([place]) 11/27/2016  . 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 Altered mental status 03/05/2013  . Respiratory failure, post-operative ([place]) 03/02/2013  . Hepatitis B 02/24/2013  . Cancer, hepatocellular ([place]) 02/10/2013  . Liver tumor-bleeding 01/15/2013  . Epidermal cyst 06/18/2012  . OSA (obstructive sleep apnea) 03/13/2012  . Abnormal leg movement 12/12/2011  . Blood pressure elevated 09/06/2011  . Diabetes mellitus ([place]) 09/06/2011  . 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 BPH (benign prostatic hyperplasia) 09/06/2011  . Diverticula of colon 09/06/2011    Past Medical History:  Diagnosis  Date  . Cancer [facility])    hepatocellular cancer   . Diabetes mellitus without complication ([place])   . Dyspnea   . Hepatitis B   . Hyperlipidemia   . Hypertension   . Irregular heart beat 01/28/2018  . Narcolepsy    per office visit note of 08/2011   . Sleep apnea    cpap    Past Surgical History:  Procedure Laterality Date  . CHOLECYSTECTOMY N/A 01/28/2018   Procedure: LAPAROSCOPIC CHOLECYSTECTOMY;  Surgeon: [name], MD;  Location: [address];  Service: General;  Laterality: N/A;  .  COLONOSCOPY WITH PROPOFOL N/A 08/27/2017   Procedure: COLONOSCOPY WITH PROPOFOL;  Surgeon: Clarene Essex, MD;  Location: [facility];  Service: Endoscopy;  Laterality: N/A;  . HOT HEMOSTASIS N/A 08/27/2017   Procedure: HOT HEMOSTASIS (ARGON PLASMA COAGULATION/BICAP);  Surgeon: Clarene Essex, MD;  Location: [facility];  Service: Endoscopy;  Laterality: N/A;  . LAPAROSCOPIC CHOLECYSTECTOMY  01/28/2018  . LAPAROSCOPY  03/02/2013   Procedure: LAPAROSCOPY DIAGNOSTIC;  Surgeon: [name], MD;  Location: [facility];  Service: General;;  . LIVER ULTRASOUND  03/02/2013   Procedure: LIVER ULTRASOUND;  Surgeon: [name], MD;  Location: [facility];  Service: General;;  . OPEN PARTIAL HEPATECTOMY   03/02/2013   Procedure: OPEN PARTIAL HEPATECTOMY [83];  Surgeon: [name], MD;  Location: [facility];  Service: General;;  DX LAPAROSCOPY, INTRAOPERATIVE LIVER ULTRASOUND, OPEN PARTIAL HEPATECTOMY  . pinched nerve in back      Social History   Socioeconomic History  . Marital status: Single    Spouse name: Not on file  . Number of children: 2  . Years of education: 15+  . Highest education level: Bachelor's degree (e.g., BA, AB, BS)  Occupational History  . Not on file  Social Needs  . Financial resource strain: Very hard  . Food insecurity:    Worry: Sometimes true    Inability: Sometimes true  . Transportation needs:    Medical: No    Non-medical: No  Tobacco Use  . Smoking status: Former Smoker    Years: 1.00  . Smokeless tobacco: Never Used  Substance and Sexual Activity  . Alcohol use: No  . Drug use: No  . Sexual activity: Yes  Lifestyle  . Physical activity:    Days per week: Not on file    Minutes per session: Not on file  . Stress: To some extent  Relationships  . Social connections:    Talks on phone: Not on file    Gets together: Not on file    Attends religious service: Not on file    Active member of club or organization: Not on file    Attends meetings of clubs or organizations:  Not on file    Relationship status: Not on file  . Intimate partner violence:    Fear of current or ex partner: Not on file    Emotionally abused: Not on file    Physically abused: Not on file    Forced sexual activity: Not on file  Other Topics Concern  . Not on file  Social History Narrative   Patient is single and lives alone- became widower when 1 child was 64 year old, divorced 2nd marriage   Has #2 grown children, not in area or involved in his life   Patient is retired.   Patient has a college education.   Patient is right-handed.   Patient drinks maybe two sodas daily.    Family History  Problem  Relation Age of Onset  . Dementia Mother   . Cancer Father   . Diabetes Brother   . Hypertension Brother   . Cancer Brother      Review of Systems  Constitutional: Negative.  Negative for chills and fever.  HENT: Negative.  Negative for sore throat.   Eyes: Negative.  Negative for blurred vision and double vision.  Respiratory: Negative.  Negative for cough and shortness of breath.   Cardiovascular: Negative.  Negative for chest pain, palpitations and leg swelling.  Gastrointestinal: Negative.  Negative for abdominal pain, blood in stool, melena, nausea and vomiting.  Genitourinary: Negative.   Skin: Negative.  Negative for rash.  Neurological: Negative.  Negative for dizziness, sensory change, speech change, focal weakness, seizures, loss of consciousness and headaches.  Endo/Heme/Allergies: Negative.   All other systems reviewed and are negative.   Vitals:   06/18/18 0834  BP: (!) 110/50  Pulse: (!) 104  Resp: 16  Temp: 98.4 F (36.9 C)  SpO2: 100%  Repeat pulse: 82 and regular  Physical Exam  Constitutional: He is oriented to person, place, and time. He appears well-developed and well-nourished.  HENT:  Head: Normocephalic and atraumatic.  Right Ear: External ear normal.  Left Ear: External ear normal.  Nose: Nose normal.  Mouth/Throat: Oropharynx is clear  and moist.  Eyes: Pupils are equal, round, and reactive to light. Conjunctivae and EOM are normal.  Neck: Normal range of motion. Neck supple. No JVD present.  Cardiovascular: Normal rate, regular rhythm and normal heart sounds.  Heart rate: 82 and regular  Pulmonary/Chest: Effort normal and breath sounds normal. He exhibits tenderness (Right posterior lower rib cage).  Abdominal: Soft. Bowel sounds are normal. He exhibits no distension. There is no tenderness.  Musculoskeletal: Normal range of motion.  Lymphadenopathy:    He has no cervical adenopathy.  Neurological: He is alert and oriented to person, place, and time. No sensory deficit. He exhibits normal muscle tone.  Skin: Skin is warm and dry. Capillary refill takes less than 2 seconds. No rash noted.  Psychiatric: He has a normal mood and affect. His behavior is normal.  Vitals reviewed.  Dg Ribs Unilateral W/chest Right  Result Date: 06/18/2018 CLINICAL DATA:  Rib pain.  Fall. EXAM: RIGHT RIBS AND CHEST - 3+ VIEW COMPARISON:  02/11/2018 FINDINGS: There is a nondisplaced fracture of the RIGHT T10 rib laterally. Bones otherwise unremarkable. There is no evidence of pneumothorax or pleural effusion. Both lungs are clear. Heart size and mediastinal contours are within normal limits. IMPRESSION: Nondisplaced fracture RIGHT T10 rib laterally. Electronically Signed   By: Staci Righter M.D.   On: 06/18/2018 09:36    A total of 40 minutes was spent in the room with the patient, greater than 50% of which was in counseling/coordination of care regarding diagnosis, treatment, medications, and need for follow-up in 2 to 3 weeks.  ASSESSMENT & PLAN: Auden was seen today for fall.  Diagnoses and all orders for this visit:  Rib pain on right side -     DG Ribs Unilateral W/Chest Right; Future -     traMADol (ULTRAM) 50 MG tablet; Take 1 tablet (50 mg total) by mouth every 8 (eight) hours as needed.  Closed fracture of one rib of right side,  initial encounter  Accidental fall, initial encounter    Patient Instructions       If you have lab work done today you will be contacted with your lab results within the next  2 weeks.  If you have not heard from [place] then please contact us. The fastest way to get your results is to register for My Chart.   IF you received an x-ray today, you will receive an invoice from [facility]. Please contact [facility] Radiology at [phone] with questions or concerns regarding your invoice.   IF you received labwork today, you will receive an invoice from [place]. Please contact [facility] at [phone] with questions or concerns regarding your invoice.   Our billing staff will not be able to assist you with questions regarding bills from these companies.  You will be contacted with the lab results as soon as they are available. The fastest way to get your results is to activate your My Chart account. Instructions are located on the last page of this paperwork. If you have not heard from [place] regarding the results in 2 weeks, please contact this office.     Rib Contusion A rib contusion is a deep bruise on your rib area. Contusions are the result of a blunt trauma that causes bleeding and injury to the tissues under the skin. A rib contusion may involve bruising of the ribs and of the skin and muscles in the area. The skin overlying the contusion may turn blue, purple, or yellow. Minor injuries will give you a painless contusion, but more severe contusions may stay painful and swollen for a few weeks. What are the causes? A contusion is usually caused by a blow, trauma, or direct force to an area of the body. This often occurs while playing contact sports. What are the signs or symptoms?  Swelling and redness of the injured area.  Discoloration of the injured area.  Tenderness and soreness of the injured area.  Pain with or without movement. How is this diagnosed? The diagnosis  can be made by taking a medical history and performing a physical exam. An X-ray, CT scan, or MRI may be needed to determine if there were any associated injuries, such as broken bones (fractures) or internal injuries. How is this treated? Often, the best treatment for a rib contusion is rest. Icing or applying cold compresses to the injured area may help reduce swelling and inflammation. Deep breathing exercises may be recommended to reduce the risk of partial lung collapse and pneumonia. Over-the-counter or prescription medicines may also be recommended for pain control. Follow these instructions at home:  Apply ice to the injured area: ? Put ice in a plastic bag. ? Place a towel between your skin and the bag. ? Leave the ice on for 20 minutes, 2-3 times per day.  Take medicines only as directed by your health care provider.  Rest the injured area. Avoid strenuous activity and any activities or movements that cause pain. Be careful during activities and avoid bumping the injured area.  Perform deep-breathing exercises as directed by your health care provider.  Do not lift anything that is heavier than 5 lb (2.3 kg) until your health care provider approves.  Do not use any tobacco products, including cigarettes, chewing tobacco, or electronic cigarettes. If you need help quitting, ask your health care provider. Contact a health care provider if:  You have increased bruising or swelling.  You have pain that is not controlled with treatment.  You have a fever. Get help right away if:  You have difficulty breathing or shortness of breath.  You develop a continual cough, or you cough up thick or bloody sputum.  You feel sick to  your stomach (nauseous), you throw up (vomit), or you have abdominal pain. This information is not intended to replace advice given to you by your health care provider. Make sure you discuss any questions you have with your health care provider. Document  Released: 05/01/2001 Document Revised: 01/12/2016 Document Reviewed: 05/18/2014 Elsevier Interactive Patient Education  2018 [employer].  Rib Fracture A rib fracture is a break or crack in one of the bones of the ribs. The ribs are like a cage that goes around your upper chest. A broken or cracked rib is often painful, but most do not cause other problems. Most rib fractures heal on their own in 1-3 months. Follow these instructions at home:  Avoid activities that cause pain to the injured area. Protect your injured area.  Slowly increase activity as told by your doctor.  Take medicine as told by your doctor.  Put ice on the injured area for the first 1-2 days after you have been treated or as told by your doctor. ? Put ice in a plastic bag. ? Place a towel between your skin and the bag. ? Leave the ice on for 15-20 minutes at a time, every 2 hours while you are awake.  Do deep breathing as told by your doctor. You may be told to: ? Take deep breaths many times a day. ? Cough many times a day while hugging a pillow. ? Use a device (incentive spirometer) to perform deep breathing many times a day.  Drink enough fluids to keep your pee (urine) clear or pale yellow.  Do not wear a rib belt or binder. These do not allow you to breathe deeply. Get help right away if:  You have a fever.  You have trouble breathing.  You cannot stop coughing.  You cough up thick or bloody spit (mucus).  You feel sick to your stomach (nauseous), throw up (vomit), or have belly (abdominal) pain.  Your pain gets worse and medicine does not help. This information is not intended to replace advice given to you by your health care provider. Make sure you discuss any questions you have with your health care provider. Document Released: 05/15/2008 Document Revised: 01/12/2016 Document Reviewed: 10/08/2012 Elsevier Interactive Patient Education  2018 [employer]       Agustina Caroli, MD [facility]

## 2018-06-24 ENCOUNTER — Encounter: Payer: Self-pay | Admitting: *Deleted

## 2018-07-08 ENCOUNTER — Ambulatory Visit: Payer: Medicare Other | Admitting: Family Medicine

## 2018-07-16 ENCOUNTER — Inpatient Hospital Stay: Payer: Medicare Other | Attending: Oncology

## 2018-07-16 DIAGNOSIS — C220 Liver cell carcinoma: Secondary | ICD-10-CM | POA: Insufficient documentation

## 2018-07-16 DIAGNOSIS — D500 Iron deficiency anemia secondary to blood loss (chronic): Secondary | ICD-10-CM | POA: Diagnosis not present

## 2018-07-16 LAB — CBC WITH DIFFERENTIAL (CANCER CENTER ONLY)
Abs Immature Granulocytes: 0.01 10*3/uL (ref 0.00–0.07)
Basophils Absolute: 0 10*3/uL (ref 0.0–0.1)
Basophils Relative: 0 %
Eosinophils Absolute: 0 10*3/uL (ref 0.0–0.5)
Eosinophils Relative: 1 %
HCT: 26.3 % — ABNORMAL LOW (ref 39.0–52.0)
Hemoglobin: 8.3 g/dL — ABNORMAL LOW (ref 13.0–17.0)
Immature Granulocytes: 0 %
Lymphocytes Relative: 17 %
Lymphs Abs: 0.6 10*3/uL — ABNORMAL LOW (ref 0.7–4.0)
MCH: 30.9 pg (ref 26.0–34.0)
MCHC: 31.6 g/dL (ref 30.0–36.0)
MCV: 97.8 fL (ref 80.0–100.0)
Monocytes Absolute: 0.3 10*3/uL (ref 0.1–1.0)
Monocytes Relative: 8 %
Neutro Abs: 2.6 10*3/uL (ref 1.7–7.7)
Neutrophils Relative %: 74 %
Platelet Count: 106 10*3/uL — ABNORMAL LOW (ref 150–400)
RBC: 2.69 MIL/uL — ABNORMAL LOW (ref 4.22–5.81)
RDW: 14.4 % (ref 11.5–15.5)
WBC Count: 3.6 10*3/uL — ABNORMAL LOW (ref 4.0–10.5)
nRBC: 0 % (ref 0.0–0.2)

## 2018-07-22 ENCOUNTER — Telehealth: Payer: Self-pay | Admitting: Emergency Medicine

## 2018-07-22 ENCOUNTER — Other Ambulatory Visit: Payer: Self-pay | Admitting: Nurse Practitioner

## 2018-07-22 ENCOUNTER — Telehealth: Payer: Self-pay | Admitting: Oncology

## 2018-07-22 DIAGNOSIS — D500 Iron deficiency anemia secondary to blood loss (chronic): Secondary | ICD-10-CM

## 2018-07-22 DIAGNOSIS — C220 Liver cell carcinoma: Secondary | ICD-10-CM

## 2018-07-22 NOTE — Telephone Encounter (Addendum)
Called patient. No answer and no VM set up. Called patients niece and asked her to please have [name] call us back regarding this note.   ----- Message from [name], NP sent at 07/22/2018  8:45 AM EST ----- Please let him know the hemoglobin is lower and confirm that he is taking oral iron.  Repeat lab in 1 month.  I have entered orders and sent a scheduling message.

## 2018-07-22 NOTE — Telephone Encounter (Signed)
Scheduled appt per 123 sch message - unable to leave message - sent reminder letter in the mail with appt date and time.

## 2018-07-22 NOTE — Telephone Encounter (Signed)
Spoke to patient. He states he takes his Oral Iron every day. Appt date given for next lab draw. He verbalized understanding of this.

## 2018-07-30 ENCOUNTER — Ambulatory Visit (INDEPENDENT_AMBULATORY_CARE_PROVIDER_SITE_OTHER): Payer: Medicare Other | Admitting: Family Medicine

## 2018-07-30 ENCOUNTER — Encounter

## 2018-07-30 ENCOUNTER — Other Ambulatory Visit: Payer: Self-pay

## 2018-07-30 ENCOUNTER — Encounter: Payer: Self-pay | Admitting: Family Medicine

## 2018-07-30 VITALS — BP 103/62 | HR 108 | Temp 98.4°F | Resp 18 | Ht 69.0 in | Wt 163.4 lb

## 2018-07-30 DIAGNOSIS — E119 Type 2 diabetes mellitus without complications: Secondary | ICD-10-CM

## 2018-07-30 DIAGNOSIS — Z9181 History of falling: Secondary | ICD-10-CM | POA: Diagnosis not present

## 2018-07-30 DIAGNOSIS — R000 Tachycardia, unspecified: Secondary | ICD-10-CM | POA: Diagnosis not present

## 2018-07-30 DIAGNOSIS — R42 Dizziness and giddiness: Secondary | ICD-10-CM

## 2018-07-30 DIAGNOSIS — D649 Anemia, unspecified: Secondary | ICD-10-CM | POA: Diagnosis not present

## 2018-07-30 DIAGNOSIS — F329 Major depressive disorder, single episode, unspecified: Secondary | ICD-10-CM

## 2018-07-30 DIAGNOSIS — F32A Depression, unspecified: Secondary | ICD-10-CM

## 2018-07-30 DIAGNOSIS — R634 Abnormal weight loss: Secondary | ICD-10-CM

## 2018-07-30 LAB — POCT CBC
Granulocyte percent: 81.1 %G — AB (ref 37–80)
HCT, POC: 24.5 % — AB (ref 29–41)
Hemoglobin: 8.1 g/dL — AB (ref 9.5–13.5)
Lymph, poc: 0.4 — AB (ref 0.6–3.4)
MCH, POC: 31.3 pg — AB (ref 27–31.2)
MCHC: 33 g/dL (ref 31.8–35.4)
MCV: 95 fL (ref 76–111)
MID (cbc): 0.2 (ref 0–0.9)
MPV: 6.2 fL (ref 0–99.8)
POC Granulocyte: 2.7 (ref 2–6.9)
POC LYMPH PERCENT: 13.1 %L (ref 10–50)
POC MID %: 5.8 %M (ref 0–12)
Platelet Count, POC: 141 10*3/uL — AB (ref 142–424)
RBC: 2.58 M/uL — AB (ref 4.69–6.13)
RDW, POC: 16.6 %
WBC: 3.3 10*3/uL — AB (ref 4.6–10.2)

## 2018-07-30 LAB — IFOBT (OCCULT BLOOD): IFOBT: NEGATIVE

## 2018-07-30 LAB — POCT GLYCOSYLATED HEMOGLOBIN (HGB A1C): Hemoglobin A1C: 4.8 % (ref 4.0–5.6)

## 2018-07-30 LAB — GLUCOSE, POCT (MANUAL RESULT ENTRY): POC Glucose: 158 mg/dl — AB (ref 70–99)

## 2018-07-30 MED ORDER — SERTRALINE HCL 25 MG PO TABS: 25.0000 mg | ORAL_TABLET | Freq: Every day | ORAL | 1 refills | Status: DC

## 2018-07-30 MED ORDER — METFORMIN HCL 500 MG PO TABS: 500.0000 mg | ORAL_TABLET | Freq: Two times a day (BID) | ORAL | 1 refills | Status: DC

## 2018-07-30 MED ORDER — GLUCOSE BLOOD VI STRP: 1.0000 | ORAL_STRIP | 1 refills | Status: DC | PRN

## 2018-07-30 NOTE — Patient Instructions (Addendum)
I am concerned about your weight loss, and fatigue symptoms.  Blood sugar test indicates that you may be overtreated.   decrease metformin to 500 mg twice per day for now.  I sent that to your pharmacy Blood pressure also borderline low, stop losartan for now.   Hemoglobin slightly lower than previous reading, but no sign of blood in the stool today.  Make sure to drink plenty of fluids, eat meals 3 times per day if possible and recheck with me in 2 days to decide on next step and recheck labs.   If any worsening lightheadedness, dizziness, or new symptoms call 911 or go to the emergency room  bring meds and pill box to next visit.  Return to the clinic or go to the nearest emergency room if any of your symptoms worsen or new symptoms occur.  If you have lab work done today you will be contacted with your lab results within the next 2 weeks.  If you have not heard from [place] then please contact us. The fastest way to get your results is to register for My Chart.   IF you received an x-ray today, you will receive an invoice from [facility]. Please contact [facility] at [phone] with questions or concerns regarding your invoice.   IF you received labwork today, you will receive an invoice from [place]. Please contact [facility] at [phone] with questions or concerns regarding your invoice.   Our billing staff will not be able to assist you with questions regarding bills from these companies.  You will be contacted with the lab results as soon as they are available. The fastest way to get your results is to activate your My Chart account. Instructions are located on the last page of this paperwork. If you have not heard from [place] regarding the results in 2 weeks, please contact this office.

## 2018-07-30 NOTE — Progress Notes (Signed)
Subjective:    Patient ID: JENNA ARDOIN, male    DOB: 03-May-1946, 72 y.o.   MRN: [id number]  HPI REYMUNDO WINSHIP is a 72 y.o. male Presents today for: Chief Complaint  Patient presents with  . Medication Refill    sertaline, lancets, and donepezil    History of fall Seen October 30 by Dr. Mitchel Honour after accidental fall at home.  Was getting up to take a pill, turned right into the lef and then fell.  Denied syncope.  Right rib cage pain.  Noted to have nondisplaced fracture of the right T10 rib laterally, treated with tramadol. Does have some more lightheadedness episodes recently, but dizziness in past as well.  Doesn't feel well. Tired and fatigue past month.  Did fall one week later and hit glasses.  Did not break the glasses, no new injuries.  Denies CP or dyspnea.  Finished ultram, pain with certain movements only.   Diabetes:  Takes metformin 1000 mg twice daily, he is on ARB and statin. Normal urine microalbumin creatinine ratio on 06/11/2018. Up-to-date on ophthalmology exam, foot exam, Pneumovax.  Lab Results  Component Value Date   HGBA1C 5.4 04/04/2018   HGBA1C 5.0 01/16/2018   HGBA1C 7.9 (H) 11/07/2017   Lab Results  Component Value Date   MICROALBUR 0.8 04/19/2016   LDLCALC 81 11/07/2017   CREATININE 1.02 04/04/2018    Memory/mood Followed by neurologist.  MMSE in September 20 5 out of 30 which had improved from 23 in March, 21 in August 2018.  He has been continued on Aricept 10 mg (# 30 with 5 rf from neuro in September) for his memory impairment and Zoloft 25 mg daily for depression.   Depression screen [facility] 2/9 07/30/2018 06/18/2018 05/09/2018 04/17/2018 04/04/2018  Decreased Interest 0 0 0 0 0  Down, Depressed, Hopeless 0 0 0 0 0  PHQ - 2 Score 0 0 0 0 0  Altered sleeping - - - - -  Tired, decreased energy - - - - -  Change in appetite - - - - -  Feeling bad or failure about yourself  - - - - -  Trouble concentrating - - - - -  Moving slowly  or fidgety/restless - - - - -  Suicidal thoughts - - - - -  PHQ-9 Score - - - - -  Difficult doing work/chores - - - - -  Some recent data might be hidden   History of hepatocellular carcinoma and pancytopenia.  Followed by [facility], seen by Ned Card.  Recent testing with low hemoglobin, plan for repeat hemoglobin in 1 month but telephone note to verify he is taking the oral iron.  Reportedly taking oral iron - 3 times per day, but has missed some doses - taking 2 on most days, 3 on other days.  Too tired to get up at times and skipping some meals, and losing weight. Not depressed.  Denies confusion.   Wt Readings from Last 3 Encounters:  07/30/18 163 lb 6.4 oz (74.1 kg)  06/18/18 174 lb 6.4 oz (79.1 kg)  06/11/18 179 lb 3.2 oz (81.3 kg)    Lab Results  Component Value Date   WBC 3.6 (L) 07/16/2018   HGB 8.3 (L) 07/16/2018   HCT 26.3 (L) 07/16/2018   MCV 97.8 07/16/2018   PLT 106 (L) 07/16/2018  Hemoglobin decreased from 9.8 on September 20, but platelets and WBC were stable from previous reading On losartan and spirinolactone  for BP.  Dark stools when taking 3 iron in a day.  No abd pain. BP Readings from Last 3 Encounters:  07/30/18 103/62  06/18/18 (!) 110/50  06/11/18 115/65    Patient Active Problem List   Diagnosis Date Noted  . 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 Insufficient treatment with nasal CPAP 05/05/2018  . 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 Irregular heart beat 01/28/2018  . Noncompliance with CPAP treatment 08/14/2017  . Excessive daytime sleepiness 04/09/2017  . Snoring 04/09/2017  . Memory impairment of gradual onset 04/09/2017  . Aortic atherosclerosis ([place]) 11/27/2016  . 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 Altered mental status 03/05/2013  . Respiratory failure, post-operative ([place]) 03/02/2013  . Hepatitis B 02/24/2013  .  Cancer, hepatocellular ([place]) 02/10/2013  . Liver tumor-bleeding 01/15/2013  . Epidermal cyst 06/18/2012  . OSA (obstructive sleep apnea) 03/13/2012  . Abnormal leg movement 12/12/2011  . Blood pressure elevated 09/06/2011  . Diabetes mellitus ([place]) 09/06/2011  . 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 BPH (benign prostatic hyperplasia) 09/06/2011  . Diverticula of colon 09/06/2011   Past Medical History:  Diagnosis Date  . Cancer [facility])    hepatocellular cancer   . Diabetes mellitus without complication ([place])   . Dyspnea   . Hepatitis B   . Hyperlipidemia   . Hypertension   . Irregular heart beat 01/28/2018  . Narcolepsy    per office visit note of 08/2011   . Sleep apnea    cpap   Past Surgical History:  Procedure Laterality Date  . CHOLECYSTECTOMY N/A 01/28/2018   Procedure: LAPAROSCOPIC CHOLECYSTECTOMY;  Surgeon: [name], MD;  Location: [place];  Service: General;  Laterality: N/A;  . COLONOSCOPY WITH PROPOFOL N/A 08/27/2017   Procedure: COLONOSCOPY WITH PROPOFOL;  Surgeon: Clarene Essex, MD;  Location: [facility];  Service: Endoscopy;  Laterality: N/A;  . HOT HEMOSTASIS N/A 08/27/2017   Procedure: HOT HEMOSTASIS (ARGON PLASMA COAGULATION/BICAP);  Surgeon: Clarene Essex, MD;  Location: [facility];  Service: Endoscopy;  Laterality: N/A;  . LAPAROSCOPIC CHOLECYSTECTOMY  01/28/2018  . LAPAROSCOPY  03/02/2013   Procedure: LAPAROSCOPY DIAGNOSTIC;  Surgeon: [name], MD;  Location: [facility];  Service: General;;  . LIVER ULTRASOUND  03/02/2013   Procedure: LIVER ULTRASOUND;  Surgeon: [name], MD;  Location: [facility];  Service: General;;  . OPEN PARTIAL HEPATECTOMY   03/02/2013   Procedure: OPEN PARTIAL HEPATECTOMY [83];  Surgeon: [name], MD;  Location: [facility];  Service: General;;  DX LAPAROSCOPY, INTRAOPERATIVE LIVER ULTRASOUND, OPEN PARTIAL HEPATECTOMY  . pinched nerve in back     Allergies  Allergen Reactions  . Ace Inhibitors Swelling and Other (See Comments)    Angioedema - face.     Prior to Admission medications   Medication Sig Start Date End Date Taking? Authorizing Provider  ACCU-CHEK SOFTCLIX LANCETS lancets Once per day testing. 08/05/17   Wendie Agreste, MD  alfuzosin (UROXATRAL) 10 MG 24 hr tablet Take 1 tablet (10 mg total) by mouth at bedtime. 06/11/18   Wendie Agreste, MD  amphetamine-dextroamphetamine (ADDERALL XR) 30 MG 24 hr capsule Take daily by mouth with water 30 minutes before breakfast. 05/05/18   Dohmeier, Asencion Partridge, MD  blood glucose meter kit and supplies Dispense based on patient and insurance preference. Use up to four times daily as directed. (FOR ICD-9 250.00, 250.01). 04/25/17   Wendie Agreste, MD  donepezil (ARICEPT) 10 MG tablet Take  1 tablet (10 mg total) by mouth at bedtime. 05/05/18   Dohmeier, Asencion Partridge, MD  ferrous sulfate (KP FERROUS SULFATE) 325 (65 FE) MG tablet Take 325 mg by mouth 3 (three) times daily with meals.     [provider]  glucose blood (ACCU-CHEK AVIVA PLUS) test strip 1 each by Other route as needed for other. Use as instructed - once per day testing for now. 08/05/17   Wendie Agreste, MD  losartan (COZAAR) 25 MG tablet Take 1 tablet (25 mg total) by mouth daily. 06/11/18   Wendie Agreste, MD  metFORMIN (GLUCOPHAGE) 1000 MG tablet TAKE 1 TABLET BY MOUTH TWO  TIMES DAILY WITH A MEAL 06/11/18   Wendie Agreste, MD  pravastatin (PRAVACHOL) 20 MG tablet Take 1 tablet (20 mg total) by mouth at bedtime. 06/11/18   Wendie Agreste, MD  sertraline (ZOLOFT) 25 MG tablet Take 1 tablet (25 mg total) by mouth daily. 06/11/18   Wendie Agreste, MD  spironolactone (ALDACTONE) 25 MG tablet Take 1 tablet (25 mg total) by mouth 2 (two) times daily. 06/11/18   Wendie Agreste, MD  traMADol (ULTRAM) 50 MG tablet Take 1 tablet (50 mg total) by mouth every 8 (eight) hours as needed. 06/18/18   Horald Pollen, MD   Social History   Socioeconomic History  . Marital status: Single    Spouse name: Not on file  .  Number of children: 2  . Years of education: 15+  . Highest education level: Bachelor's degree (e.g., BA, AB, BS)  Occupational History  . Not on file  Social Needs  . Financial resource strain: Very hard  . Food insecurity:    Worry: Sometimes true    Inability: Sometimes true  . Transportation needs:    Medical: No    Non-medical: No  Tobacco Use  . Smoking status: Former Smoker    Years: 1.00  . Smokeless tobacco: Never Used  Substance and Sexual Activity  . Alcohol use: No  . Drug use: No  . Sexual activity: Yes  Lifestyle  . Physical activity:    Days per week: Not on file    Minutes per session: Not on file  . Stress: To some extent  Relationships  . Social connections:    Talks on phone: Not on file    Gets together: Not on file    Attends religious service: Not on file    Active member of club or organization: Not on file    Attends meetings of clubs or organizations: Not on file    Relationship status: Not on file  . Intimate partner violence:    Fear of current or ex partner: Not on file    Emotionally abused: Not on file    Physically abused: Not on file    Forced sexual activity: Not on file  Other Topics Concern  . Not on file  Social History Narrative   Patient is single and lives alone- became widower when 1 child was 68 year old, divorced 2nd marriage   Has #2 grown children, not in area or involved in his life   Patient is retired.   Patient has a college education.   Patient is right-handed.   Patient drinks maybe two sodas daily.    Review of Systems Per HPI.      Objective:   Physical Exam  Constitutional: He is oriented to person, place, and time. He appears well-developed and well-nourished.  HENT:  Head:  Normocephalic and atraumatic.  Eyes: Pupils are equal, round, and reactive to light. EOM are normal.  Neck: No JVD present. Carotid bruit is not present.  Cardiovascular: Regular rhythm and normal heart sounds.  No murmur  heard. Tachycardic, but regular  Pulmonary/Chest: Effort normal and breath sounds normal. He has no rales.  Musculoskeletal: He exhibits no edema.  Neurological: He is alert and oriented to person, place, and time.  Skin: Skin is warm and dry.  Psychiatric: He has a normal mood and affect.  Vitals reviewed.  Vitals:   07/30/18 1050  BP: 103/62  Pulse: (!) 108  Resp: 18  Temp: 98.4 F (36.9 C)  TempSrc: Oral  SpO2: 100%  Weight: 163 lb 6.4 oz (74.1 kg)  Height: '5\' 9"'  (1.753 m)   EKG: Sinus tachycardia.  Nonspecific T wave, no acute findings. Results for orders placed or performed in visit on 07/30/18  POCT CBC  Result Value Ref Range   WBC 3.3 (A) 4.6 - 10.2 K/uL   Lymph, poc 0.4 (A) 0.6 - 3.4   POC LYMPH PERCENT 13.1 10 - 50 %L   MID (cbc) 0.2 0 - 0.9   POC MID % 5.8 0 - 12 %M   POC Granulocyte 2.7 2 - 6.9   Granulocyte percent 81.1 (A) 37 - 80 %G   RBC 2.58 (A) 4.69 - 6.13 M/uL   Hemoglobin 8.1 (A) 9.5 - 13.5 g/dL   HCT, POC 24.5 (A) 29 - 41 %   MCV 95.0 76 - 111 fL   MCH, POC 31.3 (A) 27 - 31.2 pg   MCHC 33.0 31.8 - 35.4 g/dL   RDW, POC 16.6 %   Platelet Count, POC 141 (A) 142 - 424 K/uL   MPV 6.2 0 - 99.8 fL  POCT glucose (manual entry)  Result Value Ref Range   POC Glucose 158 (A) 70 - 99 mg/dl  POCT glycosylated hemoglobin (Hb A1C)  Result Value Ref Range   Hemoglobin A1C 4.8 4.0 - 5.6 %   HbA1c POC (<> result, manual entry)     HbA1c, POC (prediabetic range)     HbA1c, POC (controlled diabetic range)    IFOBT POC (occult bld, rslt in office)  Result Value Ref Range   IFOBT Negative    Over 40 minutes of face-to-face care with greater than 50% counseling.  Assessment & Plan:   JODIE LEINER is a 72 y.o. male History of fall  Lightheadedness - Plan: POCT CBC, POCT glucose (manual entry), Orthostatic vital signs, Comprehensive metabolic panel  Type 2 diabetes mellitus without complication, without long-term current use of insulin (HCC) - Plan:  POCT glucose (manual entry), POCT glycosylated hemoglobin (Hb A1C), Comprehensive metabolic panel, metFORMIN (GLUCOPHAGE) 500 MG tablet, glucose blood (ACCU-CHEK AVIVA PLUS) test strip  Anemia, unspecified type - Plan: POCT CBC, IFOBT POC (occult bld, rslt in office), IFOBT POC (occult bld, rslt in office)  Dizziness - Plan: POCT CBC, POCT glucose (manual entry), IFOBT POC (occult bld, rslt in office), IFOBT POC (occult bld, rslt in office), Comprehensive metabolic panel  Tachycardia - Plan: EKG 12-Lead  Loss of weight  Depression, unspecified depression type - Plan: sertraline (ZOLOFT) 25 MG tablet   History of multiple chronic medical problems, but concern for subacute change in his status with weight loss, persistent dizziness, some tachycardia, and now history of 2 falls.   - Possible contributor with relative over controlled diabetes or possible incorrect dosing of medications given A1c versus in office glucose  reading.  Decrease metformin to 500 mg twice daily for now and bring pill minder along with medications to next visit in 2 days  -History of pancytopenia with anemia, heme-negative stool in office but hemoglobin has decreased from 9.8 September to 8.3 at most recent check from hematology November 27, now 8.1 in the office.  Decreased appetite and meals likely impacting his ability to take iron, which is likely impacting his anemia.  Suspect some tachycardia may be related to the anemia, with borderline low blood pressure.  Stop losartan for now, recommended hydration at home, meals as able, recheck CBC in 48 hours  -Denies acute depression symptoms affecting his appetite/fatigue.  Decided to continue Zoloft same dose for now.  -ER/911 precautions were given if any acute changes or worsening symptoms prior to follow-up visit with understanding expressed.  Meds ordered this encounter  Medications  . metFORMIN (GLUCOPHAGE) 500 MG tablet    Sig: Take 1 tablet (500 mg total) by mouth 2  (two) times daily with a meal.    Dispense:  60 tablet    Refill:  1  . sertraline (ZOLOFT) 25 MG tablet    Sig: Take 1 tablet (25 mg total) by mouth daily.    Dispense:  90 tablet    Refill:  1  . glucose blood (ACCU-CHEK AVIVA PLUS) test strip    Sig: 1 each by Other route as needed for other. Use as instructed - once per day testing for now.    Dispense:  100 each    Refill:  1    Please consider 90 day supplies to promote better adherence   Patient Instructions   I am concerned about your weight loss, and fatigue symptoms.  Blood sugar test indicates that you may be overtreated.   decrease metformin to 500 mg twice per day for now.  I sent that to your pharmacy Blood pressure also borderline low, stop losartan for now.   Hemoglobin slightly lower than previous reading, but no sign of blood in the stool today.  Make sure to drink plenty of fluids, eat meals 3 times per day if possible and recheck with me in 2 days to decide on next step and recheck labs.   If any worsening lightheadedness, dizziness, or new symptoms call 911 or go to the emergency room  bring meds and pill box to next visit.  Return to the clinic or go to the nearest emergency room if any of your symptoms worsen or new symptoms occur.  If you have lab work done today you will be contacted with your lab results within the next 2 weeks.  If you have not heard from [place] then please contact us. The fastest way to get your results is to register for My Chart.   IF you received an x-ray today, you will receive an invoice from [facility]. Please contact [facility] at [phone] with questions or concerns regarding your invoice.   IF you received labwork today, you will receive an invoice from [place]. Please contact [facility] at [phone] with questions or concerns regarding your invoice.   Our billing staff will not be able to assist you with questions regarding bills from these companies.  You  will be contacted with the lab results as soon as they are available. The fastest way to get your results is to activate your My Chart account. Instructions are located on the last page of this paperwork. If you have not heard from [place] regarding the results  in 2 weeks, please contact this office.      Signed,   [name], MD [facility] [place].  07/31/18 10:44 PM

## 2018-07-31 ENCOUNTER — Ambulatory Visit: Payer: Medicare Other | Admitting: Family Medicine

## 2018-07-31 ENCOUNTER — Telehealth: Payer: Self-pay | Admitting: Family Medicine

## 2018-07-31 ENCOUNTER — Telehealth: Payer: Self-pay

## 2018-07-31 LAB — SPECIMEN STATUS

## 2018-07-31 LAB — COMPREHENSIVE METABOLIC PANEL
ALT: 18 IU/L (ref 0–44)
AST: 30 IU/L (ref 0–40)
Albumin/Globulin Ratio: 1.1 — ABNORMAL LOW (ref 1.2–2.2)
Albumin: 3.5 g/dL (ref 3.5–4.8)
Alkaline Phosphatase: 136 IU/L — ABNORMAL HIGH (ref 39–117)
BUN/Creatinine Ratio: 9 — ABNORMAL LOW (ref 10–24)
BUN: 10 mg/dL (ref 8–27)
Bilirubin Total: 0.9 mg/dL (ref 0.0–1.2)
CO2: 19 mmol/L — ABNORMAL LOW (ref 20–29)
Calcium: 8.5 mg/dL — ABNORMAL LOW (ref 8.6–10.2)
Chloride: 108 mmol/L — ABNORMAL HIGH (ref 96–106)
Creatinine, Ser: 1.06 mg/dL (ref 0.76–1.27)
GFR calc Af Amer: 80 mL/min/{1.73_m2} (ref >59)
GFR calc non Af Amer: 69 mL/min/{1.73_m2} (ref >59)
Globulin, Total: 3.2 g/dL (ref 1.5–4.5)
Glucose: 138 mg/dL — ABNORMAL HIGH (ref 65–99)
Potassium: 3.9 mmol/L (ref 3.5–5.2)
Sodium: 142 mmol/L (ref 134–144)
Total Protein: 6.7 g/dL (ref 6.0–8.5)

## 2018-07-31 NOTE — Telephone Encounter (Signed)
Call from [facility].  RBC 2.53.  Provider notified, pt has f/u scheduled.

## 2018-07-31 NOTE — Telephone Encounter (Signed)
[name] form [facility] called stat RBC 2.53.  Result called to [facility]

## 2018-08-01 ENCOUNTER — Encounter: Payer: Self-pay | Admitting: Family Medicine

## 2018-08-01 ENCOUNTER — Ambulatory Visit (INDEPENDENT_AMBULATORY_CARE_PROVIDER_SITE_OTHER): Payer: Medicare Other | Admitting: Family Medicine

## 2018-08-01 ENCOUNTER — Other Ambulatory Visit: Payer: Self-pay

## 2018-08-01 VITALS — BP 120/62 | HR 85 | Temp 97.6°F | Ht 69.0 in | Wt 168.2 lb

## 2018-08-01 DIAGNOSIS — D649 Anemia, unspecified: Secondary | ICD-10-CM

## 2018-08-01 DIAGNOSIS — D61818 Other pancytopenia: Secondary | ICD-10-CM | POA: Diagnosis not present

## 2018-08-01 DIAGNOSIS — R5383 Other fatigue: Secondary | ICD-10-CM | POA: Diagnosis not present

## 2018-08-01 DIAGNOSIS — E119 Type 2 diabetes mellitus without complications: Secondary | ICD-10-CM

## 2018-08-01 LAB — POCT CBC
Granulocyte percent: 70.6 %G (ref 37–80)
HCT, POC: 23.9 % — AB (ref 29–41)
Hemoglobin: 8 g/dL — AB (ref 11–14.6)
Lymph, poc: 0.9 (ref 0.6–3.4)
MCH, POC: 31.4 pg — AB (ref 27–31.2)
MCHC: 33.5 g/dL (ref 31.8–35.4)
MCV: 94 fL (ref 76–111)
MID (cbc): 0.3 (ref 0–0.9)
MPV: 6.1 fL (ref 0–99.8)
POC Granulocyte: 2.8 (ref 2–6.9)
POC LYMPH PERCENT: 21.9 %L (ref 10–50)
POC MID %: 7.5 %M (ref 0–12)
Platelet Count, POC: 120 10*3/uL — AB (ref 142–424)
RBC: 2.55 M/uL — AB (ref 4.69–6.13)
RDW, POC: 16 %
WBC: 3.9 10*3/uL — AB (ref 4.6–10.2)

## 2018-08-01 LAB — GLUCOSE, POCT (MANUAL RESULT ENTRY): POC Glucose: 114 mg/dl — AB (ref 70–99)

## 2018-08-01 MED ORDER — METFORMIN HCL 500 MG PO TABS: 500.0000 mg | ORAL_TABLET | Freq: Two times a day (BID) | ORAL | 1 refills | Status: DC

## 2018-08-01 NOTE — Progress Notes (Signed)
Subjective:    Patient ID: [name], male    DOB: Jan 27, 1946, 72 y.o.   MRN: [id number]  HPI [name] is a 72 y.o. male Presents today for: Chief Complaint  Patient presents with  . Fatigue  . Fall    3-4 weeks ago  . Medication Refill   Here for follow-up of fatigue.  See office visit 2 days ago.  History of anemia with pancytopenia, diabetes with relatively low recent A1c, decreased appetite, with suspected decreased iron use and worsening anemia but heme-negative stool at last visit, and weight loss.  2 recent falls.  Hemoglobin had decreased from 9.8 in September to 8.3 November 27, 8.1 when checked 2 days ago.  Noted to be tachycardic at last visit with borderline blood pressure.  Losartan was discontinued.   Decreased metformin to 500 mg twice daily at last visit given relative low A1c.   Feels better compared to 2 days ago. Less of a sick feeling.  Occasional lightheadedness, but no falls. Sitting on side of bed initially helps.  Feels much better than he did few days ago.  Taking iron twice yesterday, one today. (feosol 65 - 2 -3 times per day if eating).   Eating.  3 meals yesterday.  No meal replacement supplements used right now but did take some of those in the hospital.  Sore at times in area of rib fracture.  Occasional twinge of soreness in his right upper belly, but no pain currently. Patient Active Problem List   Diagnosis Date Noted  . 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 Insufficient treatment with nasal CPAP 05/05/2018  . 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 Irregular heart beat 01/28/2018  . Noncompliance with CPAP treatment 08/14/2017  . Excessive daytime sleepiness 04/09/2017  . Snoring 04/09/2017  . Memory impairment of gradual onset 04/09/2017  . Aortic atherosclerosis ([place]) 11/27/2016  . 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 Altered  mental status 03/05/2013  . Respiratory failure, post-operative ([place]) 03/02/2013  . Hepatitis B 02/24/2013  . Cancer, hepatocellular ([place]) 02/10/2013  . Liver tumor-bleeding 01/15/2013  . Epidermal cyst 06/18/2012  . OSA (obstructive sleep apnea) 03/13/2012  . Abnormal leg movement 12/12/2011  . Blood pressure elevated 09/06/2011  . Diabetes mellitus ([place]) 09/06/2011  . 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 BPH (benign prostatic hyperplasia) 09/06/2011  . Diverticula of colon 09/06/2011   Past Medical History:  Diagnosis Date  . Cancer [facility])    hepatocellular cancer   . Diabetes mellitus without complication ([place])   . Dyspnea   . Hepatitis B   . Hyperlipidemia   . Hypertension   . Irregular heart beat 01/28/2018  . Narcolepsy    per office visit note of 08/2011   . Sleep apnea    cpap   Past Surgical History:  Procedure Laterality Date  . CHOLECYSTECTOMY N/A 01/28/2018   Procedure: LAPAROSCOPIC CHOLECYSTECTOMY;  Surgeon: [name], MD;  Location: [facility];  Service: General;  Laterality: N/A;  . COLONOSCOPY WITH PROPOFOL N/A 08/27/2017   Procedure: COLONOSCOPY WITH PROPOFOL;  Surgeon: Clarene Essex, MD;  Location: [facility];  Service: Endoscopy;  Laterality: N/A;  . HOT HEMOSTASIS N/A 08/27/2017   Procedure: HOT HEMOSTASIS (ARGON PLASMA COAGULATION/BICAP);  Surgeon: Clarene Essex, MD;  Location: [facility];  Service: Endoscopy;  Laterality: N/A;  . LAPAROSCOPIC CHOLECYSTECTOMY  01/28/2018  . LAPAROSCOPY  03/02/2013   Procedure: LAPAROSCOPY DIAGNOSTIC;  Surgeon: Dorris Fetch  Barry Dienes, MD;  Location: [facility];  Service: General;;  . LIVER ULTRASOUND  03/02/2013   Procedure: LIVER ULTRASOUND;  Surgeon: [name], MD;  Location: [facility];  Service: General;;  . OPEN PARTIAL HEPATECTOMY   03/02/2013   Procedure: OPEN PARTIAL HEPATECTOMY [83];  Surgeon: [name], MD;  Location: [facility];  Service: General;;  DX LAPAROSCOPY, INTRAOPERATIVE LIVER ULTRASOUND, OPEN PARTIAL HEPATECTOMY  . pinched nerve in back      Allergies  Allergen Reactions  . Ace Inhibitors Swelling and Other (See Comments)    Angioedema - face.    Prior to Admission medications   Medication Sig Start Date End Date Taking? Authorizing Provider  ACCU-CHEK SOFTCLIX LANCETS lancets Once per day testing. 08/05/17  Yes Wendie Agreste, MD  alfuzosin (UROXATRAL) 10 MG 24 hr tablet Take 1 tablet (10 mg total) by mouth at bedtime. 06/11/18  Yes Wendie Agreste, MD  amphetamine-dextroamphetamine (ADDERALL XR) 30 MG 24 hr capsule Take daily by mouth with water 30 minutes before breakfast. 05/05/18  Yes Dohmeier, Asencion Partridge, MD  blood glucose meter kit and supplies Dispense based on patient and insurance preference. Use up to four times daily as directed. (FOR ICD-9 250.00, 250.01). 04/25/17  Yes Wendie Agreste, MD  donepezil (ARICEPT) 10 MG tablet Take 1 tablet (10 mg total) by mouth at bedtime. 05/05/18  Yes Dohmeier, Asencion Partridge, MD  ferrous sulfate (KP FERROUS SULFATE) 325 (65 FE) MG tablet Take 325 mg by mouth 3 (three) times daily with meals.    Yes [provider]  glucose blood (ACCU-CHEK AVIVA PLUS) test strip 1 each by Other route as needed for other. Use as instructed - once per day testing for now. 07/30/18  Yes Wendie Agreste, MD  sertraline (ZOLOFT) 25 MG tablet Take 1 tablet (25 mg total) by mouth daily. 07/30/18  Yes Wendie Agreste, MD  spironolactone (ALDACTONE) 25 MG tablet Take 1 tablet (25 mg total) by mouth 2 (two) times daily. 06/11/18  Yes Wendie Agreste, MD  losartan (COZAAR) 25 MG tablet Take 1 tablet (25 mg total) by mouth daily. Patient not taking: Reported on 08/01/2018 06/11/18   Wendie Agreste, MD  metFORMIN (GLUCOPHAGE) 500 MG tablet Take 1 tablet (500 mg total) by mouth 2 (two) times daily with a meal. Patient not taking: Reported on 08/01/2018 07/30/18   Wendie Agreste, MD  pravastatin (PRAVACHOL) 20 MG tablet Take 1 tablet (20 mg total) by mouth at bedtime. Patient not taking: Reported on  08/01/2018 06/11/18   Wendie Agreste, MD  traMADol (ULTRAM) 50 MG tablet Take 1 tablet (50 mg total) by mouth every 8 (eight) hours as needed. Patient not taking: Reported on 08/01/2018 06/18/18   Horald Pollen, MD   Social History   Socioeconomic History  . Marital status: Single    Spouse name: Not on file  . Number of children: 2  . Years of education: 15+  . Highest education level: Bachelor's degree (e.g., BA, AB, BS)  Occupational History  . Not on file  Social Needs  . Financial resource strain: Very hard  . Food insecurity:    Worry: Sometimes true    Inability: Sometimes true  . Transportation needs:    Medical: No    Non-medical: No  Tobacco Use  . Smoking status: Former Smoker    Years: 1.00  . Smokeless tobacco: Never Used  Substance and Sexual Activity  . Alcohol use: No  . Drug use: No  .  Sexual activity: Yes  Lifestyle  . Physical activity:    Days per week: Not on file    Minutes per session: Not on file  . Stress: To some extent  Relationships  . Social connections:    Talks on phone: Not on file    Gets together: Not on file    Attends religious service: Not on file    Active member of club or organization: Not on file    Attends meetings of clubs or organizations: Not on file    Relationship status: Not on file  . Intimate partner violence:    Fear of current or ex partner: Not on file    Emotionally abused: Not on file    Physically abused: Not on file    Forced sexual activity: Not on file  Other Topics Concern  . Not on file  Social History Narrative   Patient is single and lives alone- became widower when 1 child was 78 year old, divorced 2nd marriage   Has #2 grown children, not in area or involved in his life   Patient is retired.   Patient has a college education.   Patient is right-handed.   Patient drinks maybe two sodas daily.    Review of Systems As above.     Objective:   Physical Exam Vitals signs reviewed.    Constitutional:      Appearance: He is well-developed.  HENT:     Head: Normocephalic and atraumatic.  Eyes:     Pupils: Pupils are equal, round, and reactive to light.  Neck:     Vascular: No carotid bruit or JVD.  Cardiovascular:     Rate and Rhythm: Normal rate and regular rhythm.     Heart sounds: Normal heart sounds. No murmur.  Pulmonary:     Effort: Pulmonary effort is normal.     Breath sounds: Normal breath sounds. No rales.  Abdominal:     General: Abdomen is flat. Bowel sounds are normal.     Palpations: Abdomen is soft.     Tenderness: There is no abdominal tenderness. There is no guarding.  Skin:    General: Skin is warm and dry.  Neurological:     Mental Status: He is alert and oriented to person, place, and time.    Vitals:   08/01/18 1642  BP: 120/62  Pulse: 85  Temp: 97.6 F (36.4 C)  TempSrc: Oral  SpO2: 100%  Weight: 168 lb 3.2 oz (76.3 kg)  Height: '5\' 9"'  (1.753 m)     Results for orders placed or performed in visit on 08/01/18  POCT glucose (manual entry)  Result Value Ref Range   POC Glucose 114 (A) 70 - 99 mg/dl  POCT CBC  Result Value Ref Range   WBC 3.9 (A) 4.6 - 10.2 K/uL   Lymph, poc 0.9 0.6 - 3.4   POC LYMPH PERCENT 21.9 10 - 50 %L   MID (cbc) 0.3 0 - 0.9   POC MID % 7.5 0 - 12 %M   POC Granulocyte 2.8 2 - 6.9   Granulocyte percent 70.6 37 - 80 %G   RBC 2.55 (A) 4.69 - 6.13 M/uL   Hemoglobin 8.0 (A) 11 - 14.6 g/dL   HCT, POC 23.9 (A) 29 - 41 %   MCV 94.0 76 - 111 fL   MCH, POC 31.4 (A) 27 - 31.2 pg   MCHC 33.5 31.8 - 35.4 g/dL   RDW, POC 16.0 %   Platelet  Count, POC 120 (A) 142 - 424 K/uL   MPV 6.1 0 - 99.8 fL       Assessment & Plan:    [name] is a 72 y.o. male Other pancytopenia ([place]) - Plan: POCT CBC  Fatigue, unspecified type - Plan: POCT glucose (manual entry), POCT CBC  Type 2 diabetes mellitus without complication, without long-term current use of insulin ([place]) - Plan: POCT glucose (manual entry),  metFORMIN (GLUCOPHAGE) 500 MG tablet  Anemia, unspecified type - Plan: POCT CBC  Fatigue symptoms improved as well as slight improvement in dizziness since last visit.  Hemoglobin is overall stable.  Anticipate some improvement in hemoglobin with increased iron intake.  Suspect prior symptoms may have been due to overcontrolled blood pressure, and possibly relative low blood sugars.  Tachycardia has resolved.  -Recheck in 3 days for repeat CBC for stability, then can determine follow-up plan with hematology  -Increase meals per day discussed including meal replacement shake if needed, coupon given for Glucerna   -  Still some intermittent right-sided pain from rib fracture.  Lungs were clear on exam, normal effort/no distress.  Reported episodic pain in the right upper quadrant, which may be radiating from his right chest wall, but if that pain persist or is more frequent, would consider imaging to evaluate for abdominal source of pain/injury.  If any drop in hemoglobin, would recommend imaging sooner, and ER eval.  -ER precautions over the weekend discussed if any worsening symptoms.  Understanding expressed.  Meds ordered this encounter  Medications  . metFORMIN (GLUCOPHAGE) 500 MG tablet    Sig: Take 1 tablet (500 mg total) by mouth 2 (two) times daily with a meal.    Dispense:  60 tablet    Refill:  1   Patient Instructions   I am glad to hear that you are feeling better today than 2 days ago.  Blood count is about the same as it was 2 days ago, still low hemoglobin/iron.  Try to take iron supplements 3 times per day if possible with meals.   Do not take losartan for now.   Metformin 56m dose is at your pharmacy.   If needed can use Glucerna shake if you are not feeling like a  full meal.  Make sure to drink plenty of fluids and follow-up with me in 3 days for repeat blood counts.    If any new lightheadedness dizziness or feeling fatigued like the other day, please go to the  emergency room as that could be a sign your blood counts are lower. Return to the clinic or go to the nearest emergency room if any of your symptoms worsen or new symptoms occur.   Anemia Anemia is a condition in which you do not have enough red blood cells or hemoglobin. Hemoglobin is a substance in red blood cells that carries oxygen. When you do not have enough red blood cells or hemoglobin (are anemic), your body cannot get enough oxygen and your organs may not work properly. As a result, you may feel very tired or have other problems. What are the causes? Common causes of anemia include:  Excessive bleeding. Anemia can be caused by excessive bleeding inside or outside the body, including bleeding from the intestine or from periods in women.  Poor nutrition.  Long-lasting (chronic) kidney, thyroid, and liver disease.  Bone marrow disorders.  Cancer and treatments for cancer.  HIV (human immunodeficiency virus) and AIDS (acquired immunodeficiency syndrome).  Treatments for HIV  and AIDS.  Spleen problems.  Blood disorders.  Infections, medicines, and autoimmune disorders that destroy red blood cells.  What are the signs or symptoms? Symptoms of this condition include:  Minor weakness.  Dizziness.  Headache.  Feeling heartbeats that are irregular or faster than normal (palpitations).  Shortness of breath, especially with exercise.  Paleness.  Cold sensitivity.  Indigestion.  Nausea.  Difficulty sleeping.  Difficulty concentrating.  Symptoms may occur suddenly or develop slowly. If your anemia is mild, you may not have symptoms. How is this diagnosed? This condition is diagnosed based on:  Blood tests.  Your medical history.  A physical exam.  Bone marrow biopsy.  Your health care provider may also check your stool (feces) for blood and may do additional testing to look for the cause of your bleeding. You may also have other tests,  including:  Imaging tests, such as a CT scan or MRI.  Endoscopy.  Colonoscopy.  How is this treated? Treatment for this condition depends on the cause. If you continue to lose a lot of blood, you may need to be treated at a hospital. Treatment may include:  Taking supplements of iron, vitamin I20, or folic acid.  Taking a hormone medicine (erythropoietin) that can help to stimulate red blood cell growth.  Having a blood transfusion. This may be needed if you lose a lot of blood.  Making changes to your diet.  Having surgery to remove your spleen.  Follow these instructions at home:  Take over-the-counter and prescription medicines only as told by your health care provider.  Take supplements only as told by your health care provider.  Follow any diet instructions that you were given.  Keep all follow-up visits as told by your health care provider. This is important. Contact a health care provider if:  You develop new bleeding anywhere in the body. Get help right away if:  You are very weak.  You are short of breath.  You have pain in your abdomen or chest.  You are dizzy or feel faint.  You have trouble concentrating.  You have bloody or black, tarry stools.  You vomit repeatedly or you vomit up blood. Summary  Anemia is a condition in which you do not have enough red blood cells or enough of a substance in your red blood cells that carries oxygen (hemoglobin).  Symptoms may occur suddenly or develop slowly.  If your anemia is mild, you may not have symptoms.  This condition is diagnosed with blood tests as well as a medical history and physical exam. Other tests may be needed.  Treatment for this condition depends on the cause of the anemia. This information is not intended to replace advice given to you by your health care provider. Make sure you discuss any questions you have with your health care provider. Document Released: 09/13/2004 Document Revised:  09/07/2016 Document Reviewed: 09/07/2016 Elsevier Interactive Patient Education  [employer].    If you have lab work done today you will be contacted with your lab results within the next 2 weeks.  If you have not heard from [place] then please contact us. The fastest way to get your results is to register for My Chart.   IF you received an x-ray today, you will receive an invoice from [facility]. Please contact [facility] at [phone] with questions or concerns regarding your invoice.   IF you received labwork today, you will receive an invoice from [facility]. Please contact [facility] at [phone]  with questions or concerns regarding your invoice.   Our billing staff will not be able to assist you with questions regarding bills from these companies.  You will be contacted with the lab results as soon as they are available. The fastest way to get your results is to activate your My Chart account. Instructions are located on the last page of this paperwork. If you have not heard from [place] regarding the results in 2 weeks, please contact this office.       Signed,   [name], MD [facility].  08/03/18 9:34 PM

## 2018-08-01 NOTE — Patient Instructions (Addendum)
I am glad to hear that you are feeling better today than 2 days ago.  Blood count is about the same as it was 2 days ago, still low hemoglobin/iron.  Try to take iron supplements 3 times per day if possible with meals.   Do not take losartan for now.   Metformin 560m dose is at your pharmacy.   If needed can use Glucerna shake if you are not feeling like a  full meal.  Make sure to drink plenty of fluids and follow-up with me in 3 days for repeat blood counts.    If any new lightheadedness dizziness or feeling fatigued like the other day, please go to the emergency room as that could be a sign your blood counts are lower. Return to the clinic or go to the nearest emergency room if any of your symptoms worsen or new symptoms occur.   Anemia Anemia is a condition in which you do not have enough red blood cells or hemoglobin. Hemoglobin is a substance in red blood cells that carries oxygen. When you do not have enough red blood cells or hemoglobin (are anemic), your body cannot get enough oxygen and your organs may not work properly. As a result, you may feel very tired or have other problems. What are the causes? Common causes of anemia include:  Excessive bleeding. Anemia can be caused by excessive bleeding inside or outside the body, including bleeding from the intestine or from periods in women.  Poor nutrition.  Long-lasting (chronic) kidney, thyroid, and liver disease.  Bone marrow disorders.  Cancer and treatments for cancer.  HIV (human immunodeficiency virus) and AIDS (acquired immunodeficiency syndrome).  Treatments for HIV and AIDS.  Spleen problems.  Blood disorders.  Infections, medicines, and autoimmune disorders that destroy red blood cells.  What are the signs or symptoms? Symptoms of this condition include:  Minor weakness.  Dizziness.  Headache.  Feeling heartbeats that are irregular or faster than normal (palpitations).  Shortness of breath, especially  with exercise.  Paleness.  Cold sensitivity.  Indigestion.  Nausea.  Difficulty sleeping.  Difficulty concentrating.  Symptoms may occur suddenly or develop slowly. If your anemia is mild, you may not have symptoms. How is this diagnosed? This condition is diagnosed based on:  Blood tests.  Your medical history.  A physical exam.  Bone marrow biopsy.  Your health care provider may also check your stool (feces) for blood and may do additional testing to look for the cause of your bleeding. You may also have other tests, including:  Imaging tests, such as a CT scan or MRI.  Endoscopy.  Colonoscopy.  How is this treated? Treatment for this condition depends on the cause. If you continue to lose a lot of blood, you may need to be treated at a hospital. Treatment may include:  Taking supplements of iron, vitamin BV78 or folic acid.  Taking a hormone medicine (erythropoietin) that can help to stimulate red blood cell growth.  Having a blood transfusion. This may be needed if you lose a lot of blood.  Making changes to your diet.  Having surgery to remove your spleen.  Follow these instructions at home:  Take over-the-counter and prescription medicines only as told by your health care provider.  Take supplements only as told by your health care provider.  Follow any diet instructions that you were given.  Keep all follow-up visits as told by your health care provider. This is important. Contact a health care provider if:  You develop new bleeding anywhere in the body. Get help right away if:  You are very weak.  You are short of breath.  You have pain in your abdomen or chest.  You are dizzy or feel faint.  You have trouble concentrating.  You have bloody or black, tarry stools.  You vomit repeatedly or you vomit up blood. Summary  Anemia is a condition in which you do not have enough red blood cells or enough of a substance in your red blood  cells that carries oxygen (hemoglobin).  Symptoms may occur suddenly or develop slowly.  If your anemia is mild, you may not have symptoms.  This condition is diagnosed with blood tests as well as a medical history and physical exam. Other tests may be needed.  Treatment for this condition depends on the cause of the anemia. This information is not intended to replace advice given to you by your health care provider. Make sure you discuss any questions you have with your health care provider. Document Released: 09/13/2004 Document Revised: 09/07/2016 Document Reviewed: 09/07/2016 Elsevier Interactive Patient Education  [employer].    If you have lab work done today you will be contacted with your lab results within the next 2 weeks.  If you have not heard from [place] then please contact us. The fastest way to get your results is to register for My Chart.   IF you received an x-ray today, you will receive an invoice from [facility]. Please contact [facility] at [phone] with questions or concerns regarding your invoice.   IF you received labwork today, you will receive an invoice from [place]. Please contact [facility] at [phone] with questions or concerns regarding your invoice.   Our billing staff will not be able to assist you with questions regarding bills from these companies.  You will be contacted with the lab results as soon as they are available. The fastest way to get your results is to activate your My Chart account. Instructions are located on the last page of this paperwork. If you have not heard from [place] regarding the results in 2 weeks, please contact this office.

## 2018-08-03 ENCOUNTER — Encounter: Payer: Self-pay | Admitting: Family Medicine

## 2018-08-04 ENCOUNTER — Telehealth: Payer: Self-pay | Admitting: Family Medicine

## 2018-08-04 ENCOUNTER — Ambulatory Visit (INDEPENDENT_AMBULATORY_CARE_PROVIDER_SITE_OTHER): Payer: Medicare Other | Admitting: Family Medicine

## 2018-08-04 DIAGNOSIS — D649 Anemia, unspecified: Secondary | ICD-10-CM

## 2018-08-04 LAB — POCT CBC
Granulocyte percent: 78.7 %G (ref 37–80)
HCT, POC: 23.5 % — AB (ref 29–41)
Hemoglobin: 7.8 g/dL — AB (ref 11–14.6)
Lymph, poc: 0.6 (ref 0.6–3.4)
MCH, POC: 31.3 pg — AB (ref 27–31.2)
MCHC: 33.2 g/dL (ref 31.8–35.4)
MCV: 94.3 fL (ref 76–111)
MID (cbc): 0.3 (ref 0–0.9)
MPV: 6.8 fL (ref 0–99.8)
POC Granulocyte: 3.1 (ref 2–6.9)
POC LYMPH PERCENT: 14.6 %L (ref 10–50)
POC MID %: 6.7 %M (ref 0–12)
Platelet Count, POC: 118 10*3/uL — AB (ref 142–424)
RBC: 2.49 M/uL — AB (ref 4.69–6.13)
RDW, POC: 17 %
WBC: 3.9 10*3/uL — AB (ref 4.6–10.2)

## 2018-08-04 NOTE — Progress Notes (Signed)
See telephone note -lab only visit after brief discussion of symptoms.  Lab Results  Component Value Date   WBC 3.9 (A) 08/04/2018   HGB 7.8 (A) 08/04/2018   HCT 23.5 (A) 08/04/2018   MCV 94.3 08/04/2018   PLT WILL FOLLOW 07/30/2018

## 2018-08-04 NOTE — Telephone Encounter (Signed)
Plan for OV, but unable to schedule.  Feels ok.  Eating 3 meals per day, taking iron 3 times per day.  No new lightheadedness or dizziness. Feel same as 3 days ago. Will check cbc today. Then follow up TBD.

## 2018-08-06 ENCOUNTER — Encounter (HOSPITAL_COMMUNITY): Payer: Self-pay | Admitting: Emergency Medicine

## 2018-08-06 ENCOUNTER — Other Ambulatory Visit: Payer: Self-pay

## 2018-08-06 ENCOUNTER — Observation Stay (HOSPITAL_COMMUNITY)
Admission: EM | Admit: 2018-08-06 | Discharge: 2018-08-08 | Disposition: A | Discharge status: Home / Self Care | Payer: Medicare Other | Attending: Family Medicine | Admitting: Family Medicine

## 2018-08-06 ENCOUNTER — Encounter: Payer: Self-pay | Admitting: Family Medicine

## 2018-08-06 ENCOUNTER — Emergency Department (HOSPITAL_COMMUNITY): Payer: Medicare Other

## 2018-08-06 ENCOUNTER — Ambulatory Visit (INDEPENDENT_AMBULATORY_CARE_PROVIDER_SITE_OTHER): Payer: Medicare Other | Admitting: Family Medicine

## 2018-08-06 VITALS — BP 98/57 | HR 101 | Temp 97.9°F | Resp 18 | Ht 69.0 in | Wt 167.0 lb

## 2018-08-06 DIAGNOSIS — S3991XA Unspecified injury of abdomen, initial encounter: Secondary | ICD-10-CM | POA: Diagnosis not present

## 2018-08-06 DIAGNOSIS — R42 Dizziness and giddiness: Secondary | ICD-10-CM

## 2018-08-06 DIAGNOSIS — I951 Orthostatic hypotension: Secondary | ICD-10-CM | POA: Diagnosis present

## 2018-08-06 DIAGNOSIS — I10 Essential (primary) hypertension: Secondary | ICD-10-CM | POA: Diagnosis not present

## 2018-08-06 DIAGNOSIS — E119 Type 2 diabetes mellitus without complications: Secondary | ICD-10-CM | POA: Diagnosis not present

## 2018-08-06 DIAGNOSIS — E785 Hyperlipidemia, unspecified: Secondary | ICD-10-CM

## 2018-08-06 DIAGNOSIS — G4733 Obstructive sleep apnea (adult) (pediatric): Secondary | ICD-10-CM | POA: Diagnosis present

## 2018-08-06 DIAGNOSIS — R1011 Right upper quadrant pain: Secondary | ICD-10-CM

## 2018-08-06 DIAGNOSIS — R5383 Other fatigue: Secondary | ICD-10-CM

## 2018-08-06 DIAGNOSIS — D649 Anemia, unspecified: Principal | ICD-10-CM | POA: Insufficient documentation

## 2018-08-06 DIAGNOSIS — Z87891 Personal history of nicotine dependence: Secondary | ICD-10-CM | POA: Diagnosis not present

## 2018-08-06 DIAGNOSIS — Z79899 Other long term (current) drug therapy: Secondary | ICD-10-CM | POA: Insufficient documentation

## 2018-08-06 DIAGNOSIS — C220 Liver cell carcinoma: Secondary | ICD-10-CM | POA: Diagnosis not present

## 2018-08-06 DIAGNOSIS — W19XXXA Unspecified fall, initial encounter: Secondary | ICD-10-CM | POA: Diagnosis not present

## 2018-08-06 DIAGNOSIS — R52 Pain, unspecified: Secondary | ICD-10-CM | POA: Diagnosis not present

## 2018-08-06 DIAGNOSIS — R2681 Unsteadiness on feet: Secondary | ICD-10-CM | POA: Insufficient documentation

## 2018-08-06 DIAGNOSIS — R531 Weakness: Secondary | ICD-10-CM | POA: Diagnosis not present

## 2018-08-06 DIAGNOSIS — D61818 Other pancytopenia: Secondary | ICD-10-CM | POA: Diagnosis not present

## 2018-08-06 DIAGNOSIS — R7889 Finding of other specified substances, not normally found in blood: Secondary | ICD-10-CM | POA: Diagnosis not present

## 2018-08-06 DIAGNOSIS — E11649 Type 2 diabetes mellitus with hypoglycemia without coma: Secondary | ICD-10-CM

## 2018-08-06 LAB — CBC
HCT: 23.8 % — ABNORMAL LOW (ref 39.0–52.0)
Hemoglobin: 7.1 g/dL — ABNORMAL LOW (ref 13.0–17.0)
MCH: 30.9 pg (ref 26.0–34.0)
MCHC: 29.8 g/dL — ABNORMAL LOW (ref 30.0–36.0)
MCV: 103.5 fL — ABNORMAL HIGH (ref 80.0–100.0)
Platelets: 87 10*3/uL — ABNORMAL LOW (ref 150–400)
RBC: 2.3 MIL/uL — ABNORMAL LOW (ref 4.22–5.81)
RDW: 15.3 % (ref 11.5–15.5)
WBC: 3.5 10*3/uL — ABNORMAL LOW (ref 4.0–10.5)
nRBC: 0 % (ref 0.0–0.2)

## 2018-08-06 LAB — COMPREHENSIVE METABOLIC PANEL
ALT: 30 U/L (ref 0–44)
AST: 41 U/L (ref 15–41)
Albumin: 3.1 g/dL — ABNORMAL LOW (ref 3.5–5.0)
Alkaline Phosphatase: 121 U/L (ref 38–126)
Anion gap: 8 (ref 5–15)
BUN: 11 mg/dL (ref 8–23)
CO2: 19 mmol/L — ABNORMAL LOW (ref 22–32)
Calcium: 8.3 mg/dL — ABNORMAL LOW (ref 8.9–10.3)
Chloride: 111 mmol/L (ref 98–111)
Creatinine, Ser: 1.03 mg/dL (ref 0.61–1.24)
GFR calc Af Amer: >60 mL/min (ref >60)
GFR calc non Af Amer: >60 mL/min (ref >60)
Glucose, Bld: 113 mg/dL — ABNORMAL HIGH (ref 70–99)
Potassium: 5 mmol/L (ref 3.5–5.1)
Sodium: 138 mmol/L (ref 135–145)
Total Bilirubin: 1.7 mg/dL — ABNORMAL HIGH (ref 0.3–1.2)
Total Protein: 6.4 g/dL — ABNORMAL LOW (ref 6.5–8.1)

## 2018-08-06 LAB — POCT CBC
Granulocyte percent: 79.9 %G (ref 37–80)
HCT, POC: 24.1 % — AB (ref 29–41)
Hemoglobin: 7.9 g/dL — AB (ref 11–14.6)
Lymph, poc: 0.7 (ref 0.6–3.4)
MCH, POC: 31 pg (ref 27–31.2)
MCHC: 32.8 g/dL (ref 31.8–35.4)
MCV: 94.6 fL (ref 76–111)
MID (cbc): 0.1 (ref 0–0.9)
MPV: 7.5 fL (ref 0–99.8)
POC Granulocyte: 3 (ref 2–6.9)
POC LYMPH PERCENT: 17.8 %L (ref 10–50)
POC MID %: 2.3 %M (ref 0–12)
Platelet Count, POC: 108 10*3/uL — AB (ref 142–424)
RBC: 2.55 M/uL — AB (ref 4.69–6.13)
RDW, POC: 17.7 %
WBC: 3.8 10*3/uL — AB (ref 4.6–10.2)

## 2018-08-06 LAB — CBC WITH DIFFERENTIAL/PLATELET
Abs Immature Granulocytes: 0.02 10*3/uL (ref 0.00–0.07)
Basophils Absolute: 0 10*3/uL (ref 0.0–0.1)
Basophils Relative: 1 %
Eosinophils Absolute: 0 10*3/uL (ref 0.0–0.5)
Eosinophils Relative: 1 %
HCT: 25.4 % — ABNORMAL LOW (ref 39.0–52.0)
Hemoglobin: 7.5 g/dL — ABNORMAL LOW (ref 13.0–17.0)
Immature Granulocytes: 1 %
Lymphocytes Relative: 10 %
Lymphs Abs: 0.4 10*3/uL — ABNORMAL LOW (ref 0.7–4.0)
MCH: 30.7 pg (ref 26.0–34.0)
MCHC: 29.5 g/dL — ABNORMAL LOW (ref 30.0–36.0)
MCV: 104.1 fL — ABNORMAL HIGH (ref 80.0–100.0)
Monocytes Absolute: 0.3 10*3/uL (ref 0.1–1.0)
Monocytes Relative: 9 %
Neutro Abs: 3 10*3/uL (ref 1.7–7.7)
Neutrophils Relative %: 78 %
Platelets: 85 10*3/uL — ABNORMAL LOW (ref 150–400)
RBC: 2.44 MIL/uL — ABNORMAL LOW (ref 4.22–5.81)
RDW: 15.4 % (ref 11.5–15.5)
WBC: 3.8 10*3/uL — ABNORMAL LOW (ref 4.0–10.5)
nRBC: 0 % (ref 0.0–0.2)

## 2018-08-06 LAB — URINALYSIS, ROUTINE W REFLEX MICROSCOPIC
Bilirubin Urine: NEGATIVE
Glucose, UA: NEGATIVE mg/dL
Hgb urine dipstick: NEGATIVE
Ketones, ur: 5 mg/dL — AB
Leukocytes, UA: NEGATIVE
Nitrite: NEGATIVE
Protein, ur: NEGATIVE mg/dL
Specific Gravity, Urine: 1.021 (ref 1.005–1.030)
pH: 5 (ref 5.0–8.0)

## 2018-08-06 LAB — GLUCOSE, POCT (MANUAL RESULT ENTRY): POC Glucose: 129 mg/dl — AB (ref 70–99)

## 2018-08-06 LAB — GLUCOSE, CAPILLARY: Glucose-Capillary: 84 mg/dL (ref 70–99)

## 2018-08-06 LAB — VITAMIN B12: Vitamin B-12: 171 pg/mL — ABNORMAL LOW (ref 180–914)

## 2018-08-06 LAB — POC OCCULT BLOOD, ED: Fecal Occult Bld: NEGATIVE

## 2018-08-06 LAB — LIPASE, BLOOD: Lipase: 36 U/L (ref 11–51)

## 2018-08-06 MED ORDER — IOPAMIDOL (ISOVUE-300) INJECTION 61%
INTRAVENOUS | Status: AC
  Filled 2018-08-06: qty 100

## 2018-08-06 MED ORDER — FERROUS SULFATE 325 (65 FE) MG PO TABS
325.0000 mg | ORAL_TABLET | Freq: Three times a day (TID) | ORAL | Status: DC
  Administered 2018-08-07 – 2018-08-08 (×5): 325 mg via ORAL
  Filled 2018-08-06 (×5): qty 1

## 2018-08-06 MED ORDER — ACETAMINOPHEN 325 MG PO TABS: 650.0000 mg | ORAL_TABLET | Freq: Four times a day (QID) | ORAL | Status: DC | PRN

## 2018-08-06 MED ORDER — ONDANSETRON HCL 4 MG PO TABS: 4.0000 mg | ORAL_TABLET | Freq: Four times a day (QID) | ORAL | Status: DC | PRN

## 2018-08-06 MED ORDER — SERTRALINE HCL 25 MG PO TABS
25.0000 mg | ORAL_TABLET | Freq: Every day | ORAL | Status: DC
  Administered 2018-08-07 – 2018-08-08 (×2): 25 mg via ORAL
  Filled 2018-08-06 (×2): qty 1

## 2018-08-06 MED ORDER — POLYETHYLENE GLYCOL 3350 17 G PO PACK: 17.0000 g | PACK | Freq: Every day | ORAL | Status: DC | PRN

## 2018-08-06 MED ORDER — IOPAMIDOL (ISOVUE-300) INJECTION 61%
100.0000 mL | Freq: Once | INTRAVENOUS | Status: AC | PRN
  Administered 2018-08-06: 100 mL via INTRAVENOUS

## 2018-08-06 MED ORDER — DONEPEZIL HCL 10 MG PO TABS
10.0000 mg | ORAL_TABLET | Freq: Every day | ORAL | Status: DC
  Administered 2018-08-06 – 2018-08-07 (×2): 10 mg via ORAL
  Filled 2018-08-06 (×2): qty 1

## 2018-08-06 MED ORDER — ONDANSETRON HCL 4 MG/2ML IJ SOLN: 4.0000 mg | Freq: Four times a day (QID) | INTRAMUSCULAR | Status: DC | PRN

## 2018-08-06 MED ORDER — SODIUM CHLORIDE (PF) 0.9 % IJ SOLN
INTRAMUSCULAR | Status: AC
  Filled 2018-08-06: qty 50

## 2018-08-06 MED ORDER — INSULIN ASPART 100 UNIT/ML ~~LOC~~ SOLN
0.0000 [IU] | Freq: Three times a day (TID) | SUBCUTANEOUS | Status: DC
  Administered 2018-08-08: 2 [IU] via SUBCUTANEOUS

## 2018-08-06 MED ORDER — PRAVASTATIN SODIUM 20 MG PO TABS
20.0000 mg | ORAL_TABLET | Freq: Every day | ORAL | Status: DC
  Administered 2018-08-06 – 2018-08-07 (×2): 20 mg via ORAL
  Filled 2018-08-06 (×2): qty 1

## 2018-08-06 MED ORDER — ACETAMINOPHEN 650 MG RE SUPP: 650.0000 mg | Freq: Four times a day (QID) | RECTAL | Status: DC | PRN

## 2018-08-06 MED ORDER — SODIUM CHLORIDE 0.9 % IV SOLN
INTRAVENOUS | Status: AC
  Administered 2018-08-06: 23:00:00 via INTRAVENOUS

## 2018-08-06 NOTE — Progress Notes (Signed)
Pt. seen for CPAP order and currently does not wear, made aware to notify if needing set up.

## 2018-08-06 NOTE — ED Provider Notes (Signed)
[place] DEPT Provider Note   CSN: [id number] Arrival date & time: 08/06/18  1433     History   Chief Complaint Chief Complaint  Patient presents with  . Fatigue  . Abnormal Lab         HPI [name] is a 72 y.o. male.  HPI 72 year old male with a history of hepatocellular cancer, diabetes, hepatitis B presents from his PCPs office via EMS for anemia and concern for liver injury.  About 3 weeks ago he fell and injured his right ribs and was told he had a rib fracture.  He states since then he has been having some right-sided flank/abdominal pain.  However this is mostly only when someone presses on this area.  Currently denies any pain while talking to me.  He has a chronic cough but no significant worsening of the cough or new cough.  No chest pain or shortness of breath.  No vomiting or blood in stool.  No urinary symptoms.  He has been told he is anemic and his PCP note indicates that he is concerned the patient injured his liver or has some other new acute liver abnormality.  The patient's doctor asked him if he had lightheadedness and he states he does whenever he for stands up first thing in the morning.  However the patient tells me this is been ongoing for about 2+ years and is stable. He received about 800 mL IVF by EMS.  Past Medical History:  Diagnosis Date  . Cancer [facility])    hepatocellular cancer   . Diabetes mellitus without complication ([place])   . Dyspnea   . Hepatitis B   . Hyperlipidemia   . Hypertension   . Irregular heart beat 01/28/2018  . Narcolepsy    per office visit note of 08/2011   . Sleep apnea    cpap    Patient Active Problem List   Diagnosis Date Noted  . 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 Insufficient treatment with nasal CPAP 05/05/2018  . 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 Irregular heart beat 01/28/2018  . Noncompliance with  CPAP treatment 08/14/2017  . Excessive daytime sleepiness 04/09/2017  . Snoring 04/09/2017  . Memory impairment of gradual onset 04/09/2017  . Aortic atherosclerosis ([place]) 11/27/2016  . 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 Altered mental status 03/05/2013  . Respiratory failure, post-operative ([place]) 03/02/2013  . Hepatitis B 02/24/2013  . Cancer, hepatocellular ([place]) 02/10/2013  . Liver tumor-bleeding 01/15/2013  . Epidermal cyst 06/18/2012  . OSA (obstructive sleep apnea) 03/13/2012  . Abnormal leg movement 12/12/2011  . Blood pressure elevated 09/06/2011  . Diabetes mellitus ([place]) 09/06/2011  . 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 BPH (benign prostatic hyperplasia) 09/06/2011  . Diverticula of colon 09/06/2011    Past Surgical History:  Procedure Laterality Date  . CHOLECYSTECTOMY N/A 01/28/2018   Procedure: LAPAROSCOPIC CHOLECYSTECTOMY;  Surgeon: [name], MD;  Location: [facility];  Service: General;  Laterality: N/A;  . COLONOSCOPY WITH PROPOFOL N/A 08/27/2017   Procedure: COLONOSCOPY WITH PROPOFOL;  Surgeon: Clarene Essex, MD;  Location: [facility];  Service: Endoscopy;  Laterality: N/A;  . HOT HEMOSTASIS N/A 08/27/2017   Procedure: HOT HEMOSTASIS (ARGON PLASMA COAGULATION/BICAP);  Surgeon: Clarene Essex, MD;  Location: [facility];  Service: Endoscopy;  Laterality: N/A;  . LAPAROSCOPIC CHOLECYSTECTOMY  01/28/2018  . LAPAROSCOPY  03/02/2013   Procedure: LAPAROSCOPY DIAGNOSTIC;  Surgeon: [name],  MD;  Location: [facility];  Service: General;;  . LIVER ULTRASOUND  03/02/2013   Procedure: LIVER ULTRASOUND;  Surgeon: [name], MD;  Location: [facility];  Service: General;;  . OPEN PARTIAL HEPATECTOMY   03/02/2013   Procedure: OPEN PARTIAL HEPATECTOMY [83];  Surgeon: [name], MD;  Location: [facility];  Service: General;;  DX LAPAROSCOPY, INTRAOPERATIVE LIVER ULTRASOUND, OPEN PARTIAL HEPATECTOMY  . pinched nerve in back          Home Medications    Prior to  Admission medications   Medication Sig Start Date End Date Taking? Authorizing Provider  alfuzosin (UROXATRAL) 10 MG 24 hr tablet Take 1 tablet (10 mg total) by mouth at bedtime. 06/11/18  Yes Wendie Agreste, MD  amphetamine-dextroamphetamine (ADDERALL XR) 30 MG 24 hr capsule Take daily by mouth with water 30 minutes before breakfast. 05/05/18  Yes Dohmeier, Asencion Partridge, MD  donepezil (ARICEPT) 10 MG tablet Take 1 tablet (10 mg total) by mouth at bedtime. 05/05/18  Yes Dohmeier, Asencion Partridge, MD  ferrous sulfate (KP FERROUS SULFATE) 325 (65 FE) MG tablet Take 325 mg by mouth 3 (three) times daily with meals.    Yes [provider]  metFORMIN (GLUCOPHAGE) 500 MG tablet Take 1 tablet (500 mg total) by mouth 2 (two) times daily with a meal. 08/01/18  Yes Wendie Agreste, MD  pravastatin (PRAVACHOL) 20 MG tablet Take 1 tablet (20 mg total) by mouth at bedtime. 06/11/18  Yes Wendie Agreste, MD  sertraline (ZOLOFT) 25 MG tablet Take 1 tablet (25 mg total) by mouth daily. 07/30/18  Yes Wendie Agreste, MD  spironolactone (ALDACTONE) 25 MG tablet Take 1 tablet (25 mg total) by mouth 2 (two) times daily. 06/11/18  Yes Wendie Agreste, MD  ACCU-CHEK SOFTCLIX LANCETS lancets Once per day testing. 08/05/17   Wendie Agreste, MD  blood glucose meter kit and supplies Dispense based on patient and insurance preference. Use up to four times daily as directed. (FOR ICD-9 250.00, 250.01). 04/25/17   Wendie Agreste, MD  glucose blood (ACCU-CHEK AVIVA PLUS) test strip 1 each by Other route as needed for other. Use as instructed - once per day testing for now. 07/30/18   Wendie Agreste, MD  traMADol (ULTRAM) 50 MG tablet Take 1 tablet (50 mg total) by mouth every 8 (eight) hours as needed. Patient not taking: Reported on 08/01/2018 06/18/18   Horald Pollen, MD    Family History Family History  Problem Relation Age of Onset  . Dementia Mother   . Cancer Father   . Diabetes Brother   .  Hypertension Brother   . Cancer Brother     Social History Social History   Tobacco Use  . Smoking status: Former Smoker    Years: 1.00  . Smokeless tobacco: Never Used  Substance Use Topics  . Alcohol use: No  . Drug use: No     Allergies   Ace inhibitors   Review of Systems Review of Systems  Constitutional: Positive for fatigue (chronic). Negative for fever.  Respiratory: Positive for cough (chronic). Negative for shortness of breath.   Cardiovascular: Negative for chest pain.  Gastrointestinal: Positive for abdominal pain. Negative for blood in stool and vomiting.  Genitourinary: Negative for dysuria and hematuria.  Neurological: Positive for light-headedness.  All other systems reviewed and are negative.    Physical Exam Updated Vital Signs BP (!) 142/73   Pulse 77   Temp (!) 97.4 F (36.3 C) (Oral)  Resp 19   Ht _0  (1.753 m)   Wt 74.4 kg   SpO2 100%   BMI 24.22 kg/m   Physical Exam Vitals signs and nursing note reviewed.  Constitutional:      Appearance: He is well-developed.  HENT:     Head: Normocephalic and atraumatic.     Right Ear: External ear normal.     Left Ear: External ear normal.     Nose: Nose normal.  Eyes:     General:        Right eye: No discharge.        Left eye: No discharge.  Neck:     Musculoskeletal: Neck supple.  Cardiovascular:     Rate and Rhythm: Normal rate and regular rhythm.     Heart sounds: Normal heart sounds.  Pulmonary:     Effort: Pulmonary effort is normal.     Breath sounds: Normal breath sounds.  Abdominal:     Palpations: Abdomen is soft.     Tenderness: There is abdominal tenderness (mild).    Genitourinary:    Comments: Essentially no stool on DRE Skin:    General: Skin is warm and dry.  Neurological:     Mental Status: He is alert.  Psychiatric:        Mood and Affect: Mood is not anxious.      ED Treatments / Results  Labs (all labs ordered are listed, but only abnormal results  are displayed) Labs Reviewed  COMPREHENSIVE METABOLIC PANEL - Abnormal; Notable for the following components:      Result Value   CO2 19 (*)    Glucose, Bld 113 (*)    Calcium 8.3 (*)    Total Protein 6.4 (*)    Albumin 3.1 (*)    Total Bilirubin 1.7 (*)    All other components within normal limits  CBC WITH DIFFERENTIAL/PLATELET - Abnormal; Notable for the following components:   WBC 3.8 (*)    RBC 2.44 (*)    Hemoglobin 7.5 (*)    HCT 25.4 (*)    MCV 104.1 (*)    MCHC 29.5 (*)    Platelets 85 (*)    Lymphs Abs 0.4 (*)    All other components within normal limits  URINALYSIS, ROUTINE W REFLEX MICROSCOPIC - Abnormal; Notable for the following components:   Ketones, ur 5 (*)    All other components within normal limits  CBC - Abnormal; Notable for the following components:   WBC 3.5 (*)    RBC 2.30 (*)    Hemoglobin 7.1 (*)    HCT 23.8 (*)    MCV 103.5 (*)    MCHC 29.8 (*)    Platelets 87 (*)    All other components within normal limits  LIPASE, BLOOD  POC OCCULT BLOOD, ED    EKG None  Radiology Ct Abdomen Pelvis W Contrast  Result Date: 08/06/2018 CLINICAL DATA:  Anemia. Recent fall. Evaluate for liver laceration. EXAM: CT ABDOMEN AND PELVIS WITH CONTRAST TECHNIQUE: Multidetector CT imaging of the abdomen and pelvis was performed using the standard protocol following bolus administration of intravenous contrast. CONTRAST:  165m ISOVUE-300 IOPAMIDOL (ISOVUE-300) INJECTION 61% COMPARISON:  CT abdomen pelvis-12/27/2017; 10/15/2017 FINDINGS: Lower chest: Limited visualization of lower thorax demonstrates minimal dependent subpleural ground-glass atelectasis. No discrete focal airspace opacities. No pleural effusion. Borderline cardiomegaly. Small amount of pericardial fluid, presumably physiologic. Hepatobiliary: There is diffuse decreased attenuation of the hepatic parenchyma on this postcontrast examination. Postsurgical change involving the  anterior aspects of the medial  segment of the left lobe of the liver. No discrete hepatic lesions. Post cholecystectomy. No intra or extrahepatic biliary ductal dilatation. Grossly unchanged mesenteric congestion without evidence of ascites. Nonocclusive thrombus is seen within the portal vein at the level of the portal splenule confluence (image 26, series 2) minimally progressed compared to the 12/2017 examination. Pancreas: Normal appearance of the pancreas Spleen: The spleen is borderline enlarged measuring 13.3 cm in length (image 63, series 5). Adrenals/Urinary Tract: There is symmetric enhancement and excretion of the bilateral kidneys. There is an approximately 1.1 cm hypoattenuating nonenhancing cyst involving the posterosuperior aspect the right kidney. No discrete left-sided renal lesions. No definite renal stones this postcontrast examination. No urinary obstruction or perinephric stranding. Normal appearance the bilateral adrenal glands. Normal appearance of the urinary bladder given degree distention. Stomach/Bowel: Bowel is normal in course and caliber without discrete area of wall thickening. Normal appearance of the terminal ileum. The appendix is not visualized however there is no definitive pericecal inflammatory change. Vascular/Lymphatic: Multiple esophageal varices are noted about the distal aspect of the esophagus (representative image 12, series 2). Tiny gastric varices are noted about the fundus (image 19, series 2). Moderate amount of atherosclerotic plaque with a normal caliber abdominal aorta, not definitely resulting in hemodynamically significant stenosis. The major branch vessels of the abdominal aorta appear patent on this non CTA examination though there is exuberant calcifications involving the origin of the celiac artery. No bulky retroperitoneal, mesenteric, pelvic or inguinal lymphadenopathy. Reproductive: Normal appearance of the prostate gland. Displaced surgical clip noted within the midline of the lower  pelvis. No free fluid the pelvic cul-de-sac. Other: Regional soft tissues appear normal. Musculoskeletal: No acute or aggressive osseous abnormalities. 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 IMPRESSION: 1. Stigmata of cirrhosis and portal venous hypertension including splenomegaly and multiple enlarged esophageal and tiny gastric varices. Given history of anemia, further evaluation with endoscopy could be performed as indicated. 2. Otherwise, no explanation for patient's anemia. Specifically, no evidence liver or splenic laceration. 3. Similar findings of borderline splenomegaly and mesenteric congestion without evidence of ascites. 4. 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 5. Avascular necrosis of the bilateral femoral heads, left greater than right, without articular surface collapse or secondary degenerative change. 6. Interval cholecystectomy. 7. Borderline cardiomegaly. 8.  Aortic Atherosclerosis (ICD10-I70.0). Electronically Signed   By: Sandi Mariscal M.D.   On: 08/06/2018 18:28    Procedures Procedures (including critical care time)  Medications Ordered in ED Medications  iopamidol (ISOVUE-300) 61 % injection (has no administration in time range)  sodium chloride (PF) 0.9 % injection (has no administration in time range)  iopamidol (ISOVUE-300) 61 % injection 100 mL (100 mLs Intravenous Contrast Given 08/06/18 1753)     Initial Impression / Assessment and Plan / ED Course  I have reviewed the triage vital signs and the nursing notes.  Pertinent labs & imaging results that were available during my care of the patient were reviewed by me and considered in my medical decision making (see chart for  details).     Patient overall appears well and his CT scan shows multiple chronic findings but no new acute emergent pathology.  He is noted to have varices on his CT scan.  Of note,  his hemoglobin is downtrending.  It was 7.9 earlier today, 7.5 on initial check here and then 7.1 on a recheck about 3 hours later.  Part of this might be delusional from having received about a liter of IV fluid from EMS but in this setting I am still concerned about acute bleeding.  Unclear where it is coming from as he does not have obvious melena.  However he is higher risk for bleeding with his cirrhosis and he will need admission for further observation and serial hemoglobins.  Hospitalist will admit.  Final Clinical Impressions(s) / ED Diagnoses   Final diagnoses:  Symptomatic anemia    ED Discharge Orders    None       Sherwood Gambler, MD 08/06/18 2033

## 2018-08-06 NOTE — H&P (Signed)
History and Physical    CHARLE CLEAR IZT:[id number] DOB: Nov 11, 1945 DOA: 08/06/2018  PCP: Wendie Agreste, MD  Patient coming from: PCP office  I have personally briefly reviewed patient's old medical records in [place]  Chief Complaint: Lightheadedness, right lateral chest wall pain  HPI: [name] is a 72 y.o. male with medical history significant for hepatocellular carcinoma, type 2 diabetes, hypertension, hyperlipidemia, history hepatitis B, pancytopenia, and OSA presents from his PCP office for further evaluation of anemia and orthostasis.  Patient reports a fall at home about 2 months ago and was found to have a nondisplaced fracture of his right T10 rib laterally.  He has had continued intermittent pain at that site since then.  He has been getting lightheaded when he stands up quickly, but denies passing out or further falls.  He says if he takes his time to get up he does not have this lightheadedness.  He presented to his PCP office earlier today for fatigue, dizziness, and anemia evaluation.  He was noted to have a hemoglobin of 7.9 but was tachycardic and orthostatic in the office.  He has been on iron supplementation and reports dark stool which he attributes to his iron pills.  He otherwise denies any obvious bleeding including epistaxis, hemoptysis, hematemesis, hematuria, or hematochezia.  ED Course:  Initial vitals showed BP 124/57, pulse 89, RR 16, temp 97.4 Fahrenheit, SPO2 100% on room air.  Orthostatic vitals were positive.   Labs are notable for WBC 3.8, hemoglobin 7.5  (7.9 three hours prior, trending down from ~10 over the last year), MCV 104, platelets 85k (baseline ~90-100k).  FOBT was negative.  CT abdomen/pelvis with contrast showed changes consistent with cirrhosis and portal venous hypertension including splenomegaly and multiple large esophageal, gastric varices.  A nonocclusive thrombus at the level of the portal splenic confluence was  noted, slightly progressed compared to May 2019 exam.  The hospital service was consulted for further evaluation management of orthostasis and anemia.  Review of Systems: As per HPI otherwise 10 point review of systems negative.    Past Medical History:  Diagnosis Date  . Cancer [facility])    hepatocellular cancer   . Diabetes mellitus without complication ([place])   . Dyspnea   . Hepatitis B   . Hyperlipidemia   . Hypertension   . Irregular heart beat 01/28/2018  . Narcolepsy    per office visit note of 08/2011   . Sleep apnea    cpap    Past Surgical History:  Procedure Laterality Date  . CHOLECYSTECTOMY N/A 01/28/2018   Procedure: LAPAROSCOPIC CHOLECYSTECTOMY;  Surgeon: [name], MD;  Location: [facility];  Service: General;  Laterality: N/A;  . COLONOSCOPY WITH PROPOFOL N/A 08/27/2017   Procedure: COLONOSCOPY WITH PROPOFOL;  Surgeon: Clarene Essex, MD;  Location: [facility];  Service: Endoscopy;  Laterality: N/A;  . HOT HEMOSTASIS N/A 08/27/2017   Procedure: HOT HEMOSTASIS (ARGON PLASMA COAGULATION/BICAP);  Surgeon: Clarene Essex, MD;  Location: [facility];  Service: Endoscopy;  Laterality: N/A;  . LAPAROSCOPIC CHOLECYSTECTOMY  01/28/2018  . LAPAROSCOPY  03/02/2013   Procedure: LAPAROSCOPY DIAGNOSTIC;  Surgeon: [name], MD;  Location: [facility];  Service: General;;  . LIVER ULTRASOUND  03/02/2013   Procedure: LIVER ULTRASOUND;  Surgeon: [name], MD;  Location: [facility];  Service: General;;  . OPEN PARTIAL HEPATECTOMY   03/02/2013   Procedure: OPEN PARTIAL HEPATECTOMY [83];  Surgeon: [name], MD;  Location: [facility];  Service: General;;  DX  LAPAROSCOPY, INTRAOPERATIVE LIVER ULTRASOUND, OPEN PARTIAL HEPATECTOMY  . pinched nerve in back       reports that he has quit smoking. He quit after 1.00 year of use. He has never used smokeless tobacco. He reports that he does not drink alcohol or use drugs.  Allergies  Allergen Reactions  . Ace Inhibitors Swelling and Other (See Comments)     Angioedema - face.     Family History  Problem Relation Age of Onset  . Dementia Mother   . Cancer Father   . Diabetes Brother   . Hypertension Brother   . Cancer Brother      Prior to Admission medications   Medication Sig Start Date End Date Taking? Authorizing Provider  alfuzosin (UROXATRAL) 10 MG 24 hr tablet Take 1 tablet (10 mg total) by mouth at bedtime. 06/11/18  Yes Wendie Agreste, MD  amphetamine-dextroamphetamine (ADDERALL XR) 30 MG 24 hr capsule Take daily by mouth with water 30 minutes before breakfast. 05/05/18  Yes Dohmeier, Asencion Partridge, MD  donepezil (ARICEPT) 10 MG tablet Take 1 tablet (10 mg total) by mouth at bedtime. 05/05/18  Yes Dohmeier, Asencion Partridge, MD  ferrous sulfate (KP FERROUS SULFATE) 325 (65 FE) MG tablet Take 325 mg by mouth 3 (three) times daily with meals.    Yes [provider]  metFORMIN (GLUCOPHAGE) 500 MG tablet Take 1 tablet (500 mg total) by mouth 2 (two) times daily with a meal. 08/01/18  Yes Wendie Agreste, MD  pravastatin (PRAVACHOL) 20 MG tablet Take 1 tablet (20 mg total) by mouth at bedtime. 06/11/18  Yes Wendie Agreste, MD  sertraline (ZOLOFT) 25 MG tablet Take 1 tablet (25 mg total) by mouth daily. 07/30/18  Yes Wendie Agreste, MD  spironolactone (ALDACTONE) 25 MG tablet Take 1 tablet (25 mg total) by mouth 2 (two) times daily. 06/11/18  Yes Wendie Agreste, MD  ACCU-CHEK SOFTCLIX LANCETS lancets Once per day testing. 08/05/17   Wendie Agreste, MD  blood glucose meter kit and supplies Dispense based on patient and insurance preference. Use up to four times daily as directed. (FOR ICD-9 250.00, 250.01). 04/25/17   Wendie Agreste, MD  glucose blood (ACCU-CHEK AVIVA PLUS) test strip 1 each by Other route as needed for other. Use as instructed - once per day testing for now. 07/30/18   Wendie Agreste, MD  traMADol (ULTRAM) 50 MG tablet Take 1 tablet (50 mg total) by mouth every 8 (eight) hours as needed. Patient not taking:  Reported on 08/01/2018 06/18/18   Horald Pollen, MD    Physical Exam: Vitals:   08/06/18 1441 08/06/18 1443 08/06/18 1700 08/06/18 1900  BP:  (!) 124/57 119/68 (!) 142/73  Pulse:  89 87 77  Resp:  _0 Temp:  (!) 97.4 F (36.3 C)    TempSrc:  Oral    SpO2: 100% 100% 98% 100%  Weight:  74.4 kg    Height:  _1  (1.753 m)      Constitutional: Elderly man lying flat in bed, NAD, calm, comfortable Eyes: PERRL, lids and conjunctivae normal ENMT: Mucous membranes are moist. Posterior pharynx clear of any exudate or lesions.poor dentition.  Neck: normal, supple, no masses. Respiratory: clear to auscultation bilaterally, no wheezing, no crackles. Normal respiratory effort. No accessory muscle use.  Cardiovascular: Regular rate and rhythm, no murmurs / rubs / gallops. No extremity edema. Abdomen: no tenderness, no masses palpated. No hepatosplenomegaly. Bowel sounds positive.  Musculoskeletal: Point  tenderness right lateral chest wall along the lower rib.  No clubbing / cyanosis. No joint deformity upper and lower extremities. Good ROM, no contractures. Normal muscle tone.  Skin: no rashes, lesions, ulcers. No induration Neurologic: CN 2-12 grossly intact. Sensation intact. Strength 5/5 in all 4.  Psychiatric: Normal judgment and insight. Alert and oriented x 3. Normal mood.     Labs on Admission: I have personally reviewed following labs and imaging studies  CBC: Recent Labs  Lab 08/01/18 1715 08/04/18 1202 08/06/18 1241 08/06/18 1549 08/06/18 1852  WBC 3.9* 3.9* 3.8* 3.8* 3.5*  NEUTROABS  --   --   --  3.0  --   HGB 8.0* 7.8* 7.9* 7.5* 7.1*  HCT 23.9* 23.5* 24.1* 25.4* 23.8*  MCV 94.0 94.3 94.6 104.1* 103.5*  PLT  --   --   --  85* 87*   Basic Metabolic Panel: Recent Labs  Lab 08/06/18 1549  NA 138  K 5.0  CL 111  CO2 19*  GLUCOSE 113*  BUN 11  CREATININE 1.03  CALCIUM 8.3*   GFR: Estimated Creatinine Clearance: 63.9 mL/min (by C-G formula based on  SCr of 1.03 mg/dL). Liver Function Tests: Recent Labs  Lab 08/06/18 1549  AST 41  ALT 30  ALKPHOS 121  BILITOT 1.7*  PROT 6.4*  ALBUMIN 3.1*   Recent Labs  Lab 08/06/18 1549  LIPASE 36   No results for input(s): AMMONIA in the last 168 hours. Coagulation Profile: No results for input(s): INR, PROTIME in the last 168 hours. Cardiac Enzymes: No results for input(s): CKTOTAL, CKMB, CKMBINDEX, TROPONINI in the last 168 hours. BNP (last 3 results) No results for input(s): PROBNP in the last 8760 hours. HbA1C: No results for input(s): HGBA1C in the last 72 hours. CBG: No results for input(s): GLUCAP in the last 168 hours. Lipid Profile: No results for input(s): CHOL, HDL, LDLCALC, TRIG, CHOLHDL, LDLDIRECT in the last 72 hours. Thyroid Function Tests: No results for input(s): TSH, T4TOTAL, FREET4, T3FREE, THYROIDAB in the last 72 hours. Anemia Panel: No results for input(s): VITAMINB12, FOLATE, FERRITIN, TIBC, IRON, RETICCTPCT in the last 72 hours. Urine analysis:    Component Value Date/Time   COLORURINE YELLOW 08/06/2018 [place] 08/06/2018 1549   LABSPEC 1.021 08/06/2018 1549   PHURINE 5.0 08/06/2018 1549   GLUCOSEU NEGATIVE 08/06/2018 1549   HGBUR NEGATIVE 08/06/2018 1549   BILIRUBINUR NEGATIVE 08/06/2018 1549   BILIRUBINUR neg 01/15/2013 1733   KETONESUR 5 (A) 08/06/2018 1549   PROTEINUR NEGATIVE 08/06/2018 1549   UROBILINOGEN 2.0 01/15/2013 1733   NITRITE NEGATIVE 08/06/2018 1549   LEUKOCYTESUR NEGATIVE 08/06/2018 1549    Radiological Exams on Admission: Ct Abdomen Pelvis W Contrast  Result Date: 08/06/2018 CLINICAL DATA:  Anemia. Recent fall. Evaluate for liver laceration. EXAM: CT ABDOMEN AND PELVIS WITH CONTRAST TECHNIQUE: Multidetector CT imaging of the abdomen and pelvis was performed using the standard protocol following bolus administration of intravenous contrast. CONTRAST:  126m ISOVUE-300 IOPAMIDOL (ISOVUE-300) INJECTION 61%  COMPARISON:  CT abdomen pelvis-12/27/2017; 10/15/2017 FINDINGS: Lower chest: Limited visualization of lower thorax demonstrates minimal dependent subpleural ground-glass atelectasis. No discrete focal airspace opacities. No pleural effusion. Borderline cardiomegaly. Small amount of pericardial fluid, presumably physiologic. Hepatobiliary: There is diffuse decreased attenuation of the hepatic parenchyma on this postcontrast examination. Postsurgical change involving the anterior aspects of the medial segment of the left lobe of the liver. No discrete hepatic lesions. Post cholecystectomy. No intra or extrahepatic biliary ductal dilatation. Grossly unchanged  mesenteric congestion without evidence of ascites. Nonocclusive thrombus is seen within the portal vein at the level of the portal splenule confluence (image 26, series 2) minimally progressed compared to the 12/2017 examination. Pancreas: Normal appearance of the pancreas Spleen: The spleen is borderline enlarged measuring 13.3 cm in length (image 63, series 5). Adrenals/Urinary Tract: There is symmetric enhancement and excretion of the bilateral kidneys. There is an approximately 1.1 cm hypoattenuating nonenhancing cyst involving the posterosuperior aspect the right kidney. No discrete left-sided renal lesions. No definite renal stones this postcontrast examination. No urinary obstruction or perinephric stranding. Normal appearance the bilateral adrenal glands. Normal appearance of the urinary bladder given degree distention. Stomach/Bowel: Bowel is normal in course and caliber without discrete area of wall thickening. Normal appearance of the terminal ileum. The appendix is not visualized however there is no definitive pericecal inflammatory change. Vascular/Lymphatic: Multiple esophageal varices are noted about the distal aspect of the esophagus (representative image 12, series 2). Tiny gastric varices are noted about the fundus (image 19, series 2). Moderate  amount of atherosclerotic plaque with a normal caliber abdominal aorta, not definitely resulting in hemodynamically significant stenosis. The major branch vessels of the abdominal aorta appear patent on this non CTA examination though there is exuberant calcifications involving the origin of the celiac artery. No bulky retroperitoneal, mesenteric, pelvic or inguinal lymphadenopathy. Reproductive: Normal appearance of the prostate gland. Displaced surgical clip noted within the midline of the lower pelvis. No free fluid the pelvic cul-de-sac. Other: Regional soft tissues appear normal. Musculoskeletal: No acute or aggressive osseous abnormalities. 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 IMPRESSION: 1. Stigmata of cirrhosis and portal venous hypertension including splenomegaly and multiple enlarged esophageal and tiny gastric varices. Given history of anemia, further evaluation with endoscopy could be performed as indicated. 2. Otherwise, no explanation for patient's anemia. Specifically, no evidence liver or splenic laceration. 3. Similar findings of borderline splenomegaly and mesenteric congestion without evidence of ascites. 4. 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 5. Avascular necrosis of the bilateral femoral heads, left greater than right, without articular surface collapse or secondary degenerative change. 6. Interval cholecystectomy. 7. Borderline cardiomegaly. 8.  Aortic Atherosclerosis (ICD10-I70.0). Electronically Signed   By: Sandi Mariscal M.D.   On: 08/06/2018 18:28     Assessment/Plan Principal Problem:   Orthostatic hypotension Active Problems:   Diabetes mellitus (HCC)   OSA (obstructive sleep apnea)    Cancer, hepatocellular (HCC)   Hypertension   Hyperlipidemia   Pancytopenia (HCC)   COYE DAWOOD is a 72 y.o. male with medical history significant for hepatocellular carcinoma, type 2 diabetes, hypertension, hyperlipidemia, history hepatitis B, pancytopenia, and OSA presents from his PCP office for further evaluation of anemia and orthostasis.  Orthostatic hypotension, hx HTN: Orthostatic vitals positive on admission.  He was previously on losartan which was temporarily discontinued by his PCP due to orthostasis.  He has been taking spironolactone. -Hold home spironolactone, alfuzosin -IV fluids overnight -Recheck orthostatic vitals in a.m.  Pancytopenia: Patient with chronic pancytopenia suspect secondary to history of cirrhosis.  His hemoglobin has been slowly trending down over the last year without any obvious bleeding.  Colonoscopy in January 2019 showed multiple nonbleeding colonic angiodysplastic lesions treated with argon plasma coagulation, diverticulosis in the sigmoid and ascending colon.  He tells me he also had a capsule endoscopy which did not show bleeding, but I am unable to find those records.  His mild decrease in hemoglobin today may be secondary to dilution and/or phlebotomy. -Repeat CBC in a.m. -Continue monitor for signs/symptoms of bleeding -Consider GI evaluation with upper endoscopy and/or colonoscopy; this may be able to be done on an outpatient basis -Continue home iron supplement -Check vitamin B12 given macrocytosis   Cirrhosis with esophageal/gastric varices, Hx hepatocellular carcinoma: Currently compensated without encephalopathy or abdominal distention. -Continue to monitor  Type 2 diabetes: Holding home metformin. -SSI  Hyperlipidemia: -Continue home pravastatin  OSA: CPAP nightly  DVT prophylaxis: SCDs Code Status: DNR, confirmed with patient Family Communication: None at bedside Disposition Plan: Anticipate discharge to home pending  improvement in orthostasis, stable hemoglobin Consults called: None Admission status: Observation   Zada Finders MD [facility] Pager [phone]  If 7PM-7AM, please contact night-coverage [email or web address] Password [facility]  08/06/2018, 9:34 PM

## 2018-08-06 NOTE — Progress Notes (Signed)
Subjective:    Patient ID: [name], male    DOB: 1946-01-18, 72 y.o.   MRN: [id number]  HPI [name] is a 72 y.o. male Presents today for: Chief Complaint  Patient presents with  . Follow-up   Here for follow-up of fatigue.  See previous office visits.  Fatigue was thought to be multifactorial with possible overtreatment of diabetes, metformin was decreased from 1000 mg to 500 mg BId.  Also thought to be overtreated hypertension.  Losartan was discontinued at 12/11 visit.  Additionally has history of pancytopenia, had lower hemoglobin than usual when checked by hematology on November 27 with a hemoglobin of 8.3 (decreased from 9.2 on 06/11/2018).  Thoughtthat some of this decline may have been from not taking iron as often as his appetite had decreased, less p.o. intake and less iron intake.  Had also experienced some weight loss, which we also thought might be due to less p.o. intake with decreased appetite. Heme negative stool testing 1 week ago.   Symptomatically was improving on 12/13 visit.  Hemoglobin with minimal change 8.0, down from 8.1 on 12/11.  He presented 2 days ago just for lab only visit for recheck CBC, I briefly spoke to him that day and he felt that his symptoms were stable from the 13th.  He was eating 3 meals per day, taking iron 3 times per day, denied any new lightheadedness or dizziness.  Hemoglobin 7.8 at that time.  Today feels more fatigued. Some less energy. Dark stools with iron, but  No bleeding. No falls since last visit or LOC. More dizzy this morning. Improved after 10 mins.   Had fall with R rib fracture 10/30.does note some soreness in R flank/lateral pain iun upper abdomen at times since then.  No abdominal distension. No abd bruising. Did fall in early November, caught self - scraped nose. No other injuries.   Has been drinking some juice, water today. 3 meals yesterday, and took iron 3 times yesterday.   Still on spirinolactone 59m  BID, metforrmin 5072mBID- home meds in bottles brought to office. Not taking losartan.     Patient Active Problem List   Diagnosis Date Noted  . 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 Insufficient treatment with nasal CPAP 05/05/2018  . 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 Irregular heart beat 01/28/2018  . Noncompliance with CPAP treatment 08/14/2017  . Excessive daytime sleepiness 04/09/2017  . Snoring 04/09/2017  . Memory impairment of gradual onset 04/09/2017  . Aortic atherosclerosis (HCWilson04/05/2017  . 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 Altered mental status 03/05/2013  . Respiratory failure, post-operative (HCLenox07/14/2014  . Hepatitis B 02/24/2013  . Cancer, hepatocellular (HCMinto06/24/2014  . Liver tumor-bleeding 01/15/2013  . Epidermal cyst 06/18/2012  . OSA (obstructive sleep apnea) 03/13/2012  . Abnormal leg movement 12/12/2011  . Blood pressure elevated 09/06/2011  . Diabetes mellitus (HCWilton Center01/17/2013  . 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 BPH (benign prostatic hyperplasia) 09/06/2011  . Diverticula of colon 09/06/2011   Past Medical History:  Diagnosis Date  . [facility]   hepatocellular cancer   . Diabetes mellitus without complication (HC[facility]  . Dyspnea   . Hepatitis B   . Hyperlipidemia   . Hypertension   . Irregular heart beat 01/28/2018  . Narcolepsy    per office visit note of 08/2011   . Sleep apnea  cpap   Past Surgical History:  Procedure Laterality Date  . CHOLECYSTECTOMY N/A 01/28/2018   Procedure: LAPAROSCOPIC CHOLECYSTECTOMY;  Surgeon: [name], MD;  Location: [facility];  Service: General;  Laterality: N/A;  . COLONOSCOPY WITH PROPOFOL N/A 08/27/2017   Procedure: COLONOSCOPY WITH PROPOFOL;  Surgeon: Clarene Essex, MD;  [facility];  Service: Endoscopy;  Laterality: N/A;  . HOT HEMOSTASIS N/A 08/27/2017   Procedure: HOT HEMOSTASIS (ARGON PLASMA  COAGULATION/BICAP);  Surgeon: Clarene Essex, MD;  [facility];  Service: Endoscopy;  Laterality: N/A;  . LAPAROSCOPIC CHOLECYSTECTOMY  01/28/2018  . LAPAROSCOPY  03/02/2013   Procedure: LAPAROSCOPY DIAGNOSTIC;  Surgeon: [name], MD;  [facility];  Service: General;;  . LIVER ULTRASOUND  03/02/2013   Procedure: LIVER ULTRASOUND;  Surgeon: [name], MD;  [facility];  Service: General;;  . OPEN PARTIAL HEPATECTOMY   03/02/2013   Procedure: OPEN PARTIAL HEPATECTOMY [83];  Surgeon: [name], MD;  Location[facility];  Service: General;;  DX LAPAROSCOPY, INTRAOPERATIVE LIVER ULTRASOUND, OPEN PARTIAL HEPATECTOMY  . pinched nerve in back     Allergies  Allergen Reactions  . Ace Inhibitors Swelling and Other (See Comments)    Angioedema - face.    Prior to Admission medications   Medication Sig Start Date End Date Taking? Authorizing Provider  ACCU-CHEK SOFTCLIX LANCETS lancets Once per day testing. 08/05/17  Yes Wendie Agreste, MD  alfuzosin (UROXATRAL) 10 MG 24 hr tablet Take 1 tablet (10 mg total) by mouth at bedtime. 06/11/18  Yes Wendie Agreste, MD  amphetamine-dextroamphetamine (ADDERALL XR) 30 MG 24 hr capsule Take daily by mouth with water 30 minutes before breakfast. 05/05/18  Yes Dohmeier, Asencion Partridge, MD  blood glucose meter kit and supplies Dispense based on patient and insurance preference. Use up to four times daily as directed. (FOR ICD-9 250.00, 250.01). 04/25/17  Yes Wendie Agreste, MD  donepezil (ARICEPT) 10 MG tablet Take 1 tablet (10 mg total) by mouth at bedtime. 05/05/18  Yes Dohmeier, Asencion Partridge, MD  ferrous sulfate (KP FERROUS SULFATE) 325 (65 FE) MG tablet Take 325 mg by mouth 3 (three) times daily with meals.    Yes [provider]  glucose blood (ACCU-CHEK AVIVA PLUS) test strip 1 each by Other route as needed for other. Use as instructed - once per day testing for now. 07/30/18  Yes Wendie Agreste, MD  metFORMIN (GLUCOPHAGE) 500 MG tablet  Take 1 tablet (500 mg total) by mouth 2 (two) times daily with a meal. 08/01/18  Yes Wendie Agreste, MD  sertraline (ZOLOFT) 25 MG tablet Take 1 tablet (25 mg total) by mouth daily. 07/30/18  Yes Wendie Agreste, MD  spironolactone (ALDACTONE) 25 MG tablet Take 1 tablet (25 mg total) by mouth 2 (two) times daily. 06/11/18  Yes Wendie Agreste, MD  pravastatin (PRAVACHOL) 20 MG tablet Take 1 tablet (20 mg total) by mouth at bedtime. Patient not taking: Reported on 08/01/2018 06/11/18   Wendie Agreste, MD  traMADol (ULTRAM) 50 MG tablet Take 1 tablet (50 mg total) by mouth every 8 (eight) hours as needed. Patient not taking: Reported on 08/01/2018 06/18/18   Horald Pollen, MD   Social History   Socioeconomic History  . Marital status: Single    Spouse name: Not on file  . Number of children: 2  . Years of education: 15+  . Highest education level: Bachelor's degree (e.g., BA, AB, BS)  Occupational History  . Not on  file  Social Needs  . Financial resource strain: Very hard  . Food insecurity:    Worry: Sometimes true    Inability: Sometimes true  . Transportation needs:    Medical: No    Non-medical: No  Tobacco Use  . Smoking status: Former Smoker    Years: 1.00  . Smokeless tobacco: Never Used  Substance and Sexual Activity  . Alcohol use: No  . Drug use: No  . Sexual activity: Yes  Lifestyle  . Physical activity:    Days per week: Not on file    Minutes per session: Not on file  . Stress: To some extent  Relationships  . Social connections:    Talks on phone: Not on file    Gets together: Not on file    Attends religious service: Not on file    Active member of club or organization: Not on file    Attends meetings of clubs or organizations: Not on file    Relationship status: Not on file  . Intimate partner violence:    Fear of current or ex partner: Not on file    Emotionally abused: Not on file    Physically abused: Not on file    Forced sexual  activity: Not on file  Other Topics Concern  . Not on file  Social History Narrative   Patient is single and lives alone- became widower when 1 child was 75 year old, divorced 2nd marriage   Has #2 grown children, not in area or involved in his life   Patient is retired.   Patient has a college education.   Patient is right-handed.   Patient drinks maybe two sodas daily.    Review of Systems     Objective:   Physical Exam Vitals signs reviewed.  Constitutional:      Appearance: He is well-developed.  HENT:     Head: Normocephalic and atraumatic.  Eyes:     Pupils: Pupils are equal, round, and reactive to light.  Neck:     Vascular: No carotid bruit or JVD.  Cardiovascular:     Rate and Rhythm: Normal rate and regular rhythm.     Heart sounds: Normal heart sounds. No murmur.  Pulmonary:     Effort: Pulmonary effort is normal.     Breath sounds: Normal breath sounds. No rales.  Abdominal:     Tenderness: There is abdominal tenderness (RUQ. no rebound/guarding.  Skin intact, negative Grey Turner/negative: cullen).  Skin:    General: Skin is warm and dry.  Neurological:     Mental Status: He is alert and oriented to person, place, and time.    Vitals:   08/06/18 1230  BP: (!) 98/57  Pulse: (!) 101  Resp: 18  Temp: 97.9 F (36.6 C)  TempSrc: Oral  SpO2: 100%  Weight: 167 lb (75.8 kg)  Height: _0  (1.753 m)   Orthostatic VS for the past 24 hrs (Last 3 readings):  BP- Lying Pulse- Lying BP- Sitting Pulse- Sitting BP- Standing at 0 minutes Pulse- Standing at 0 minutes BP- Standing at 3 minutes Pulse- Standing at 3 minutes  08/06/18 1317 114/70 87 123/72 91 92/58 105 103/67 104    Results for orders placed or performed in visit on 08/06/18  POCT CBC  Result Value Ref Range   WBC 3.8 (A) 4.6 - 10.2 K/uL   Lymph, poc 0.7 0.6 - 3.4   POC LYMPH PERCENT 17.8 10 - 50 %L   MID (cbc)  0.1 0 - 0.9   POC MID % 2.3 0 - 12 %M   POC Granulocyte 3.0 2 - 6.9   Granulocyte  percent 79.9 37 - 80 %G   RBC 2.55 (A) 4.69 - 6.13 M/uL   Hemoglobin 7.9 (A) 11 - 14.6 g/dL   HCT, POC 24.1 (A) 29 - 41 %   MCV 94.6 76 - 111 fL   MCH, POC 31.0 27 - 31.2 pg   MCHC 32.8 31.8 - 35.4 g/dL   RDW, POC 17.7 %   Platelet Count, POC 108 (A) 142 - 424 K/uL   MPV 7.5 0 - 99.8 fL   Does not feel dizzy sitting, or at present.      Assessment & Plan:  [name] is a 72 y.o. male Anemia, unspecified type - Plan: POCT CBC  Fatigue, unspecified type - Plan: POCT glucose (manual entry)  Dizziness  RUQ abdominal pain  Complicated medical history as above including pancytopenia, diabetes, fall in October.  Has had some intermittent worsening fatigue, dizziness, worsened anemia with some improvement temporarily off losartan and lower dose of metformin, now more dizzy, fatigued today.   - Pancytopenia, followed by hematology. Hemoglobin 7.8 when checked 2 days ago, 7.9 now, but tachycardic. orthostatic on exam today.  Has been taking iron 3 times daily. Overall worsening anemia in spite of increased iron intake. Heme negative stool last week. eval in ER to decide on transfusion, and further eval for worsening anemia.   -Still some right upper quadrant discomfort on exam, reports this has been intermittent since prior fall, differential includes intra-abdominal injury with previous fall and possible intermittent bleeding. May need abdominal imaging/CT.   - still on spirinolactone, BP improved last visit - now lower. Likely will need to hold diuretic temporarily.   - sent to ER by EMS for above. IV placed for transport.  2:07 PM Report to EMS with transfer of care.   No orders of the defined types were placed in this encounter.  Patient Instructions       If you have lab work done today you will be contacted with your lab results within the next 2 weeks.  If you have not heard from [place] then please contact us. The fastest way to get your results is to register for My  Chart.   IF you received an x-ray today, you will receive an invoice from [facility]. Please contact [facility] at [phone] with questions or concerns regarding your invoice.   IF you received labwork today, you will receive an invoice from [place]. Please contact [facility] at [phone] with questions or concerns regarding your invoice.   Our billing staff will not be able to assist you with questions regarding bills from these companies.  You will be contacted with the lab results as soon as they are available. The fastest way to get your results is to activate your My Chart account. Instructions are located on the last page of this paperwork. If you have not heard from [place] regarding the results in 2 weeks, please contact this office.      Signed,   [name], MD [facility].  08/06/18 1:47 PM

## 2018-08-06 NOTE — ED Notes (Signed)
ED TO INPATIENT HANDOFF REPORT  Name/Age/Gender [name] 72 y.o. male  Code Status Code Status History    Date Active Date Inactive Code Status Order ID Comments User Context   01/28/2018 2240 01/30/2018 2114 Full Code [id number]  [name], MD Inpatient   03/02/2013 1618 03/09/2013 1715 Full Code [id number]  [name], MD Inpatient   01/16/2013 0307 01/19/2013 1348 Full Code [id number]  Odis Hollingshead, MD Inpatient    Advance Directive Documentation     Most Recent Value  Type of Advance Directive  Healthcare Power of Attorney  Pre-existing out of facility DNR order (yellow form or pink MOST form)  -  "MOST" Form in Place?  -      Home/SNF/Other Given to floor  Chief Complaint weakness  Level of Care/Admitting Diagnosis ED Disposition    ED Disposition Condition [place]: [facility] [[id number]]  Level of Care: Med-Surg [16]  Diagnosis: Orthostatic hypotension [458.0.ICD-9-CM]  Admitting Physician: Lenore Cordia [[id number]]  Attending Physician: Lenore Cordia [[id number]]  PT Class (Do Not Modify): Observation [104]  PT Acc Code (Do Not Modify): Observation [10022]       Medical History Past Medical History:  Diagnosis Date  . Cancer [facility])    hepatocellular cancer   . Diabetes mellitus without complication ([place])   . Dyspnea   . Hepatitis B   . Hyperlipidemia   . Hypertension   . Irregular heart beat 01/28/2018  . Narcolepsy    per office visit note of 08/2011   . Sleep apnea    cpap    Allergies Allergies  Allergen Reactions  . Ace Inhibitors Swelling and Other (See Comments)    Angioedema - face.     IV Location/Drains/Wounds Patient Lines/Drains/Airways Status   Active Line/Drains/Airways    Name:   Placement date:   Placement time:   Site:   Days:   Peripheral IV 12/27/17 Left Hand   12/27/17    0336    Hand   222   Peripheral IV 08/06/18 Left Forearm   08/06/18    1350    Forearm   less than 1    Peripheral IV 08/06/18 Left Wrist   08/06/18    1442    Wrist   less than 1   Incision 02/04/13 Abdomen Mid;Upper   02/04/13    1034     2009   Incision 03/02/13 Abdomen   03/02/13    1150     1983   Incision (Closed) 01/28/18 Abdomen Other (Comment)   01/28/18    1534     190   Incision - 1 Port Abdomen Umbilicus   18/84/16    6063     1983   Incision - 5 Ports Abdomen 1: Medial;Upper 2: Medial;Mid 3: Umbilicus 4: Right;Lateral;Lower 5: Right;Lower   01/28/18    1410     190          Labs/Imaging Results for orders placed or performed during the hospital encounter of 08/06/18 (from the past 48 hour(s))  Comprehensive metabolic panel     Status: Abnormal   Collection Time: 08/06/18  3:49 PM  Result Value Ref Range   Sodium 138 135 - 145 mmol/L   Potassium 5.0 3.5 - 5.1 mmol/L   Chloride 111 98 - 111 mmol/L   CO2 19 (L) 22 - 32 mmol/L   Glucose, Bld 113 (H) 70 - 99 mg/dL   BUN 11  8 - 23 mg/dL   Creatinine, Ser 1.03 0.61 - 1.24 mg/dL   Calcium 8.3 (L) 8.9 - 10.3 mg/dL   Total Protein 6.4 (L) 6.5 - 8.1 g/dL   Albumin 3.1 (L) 3.5 - 5.0 g/dL   AST 41 15 - 41 U/L   ALT 30 0 - 44 U/L   Alkaline Phosphatase 121 38 - 126 U/L   Total Bilirubin 1.7 (H) 0.3 - 1.2 mg/dL   GFR calc non Af Amer >60 >60 mL/min   GFR calc Af Amer >60 >60 mL/min   Anion gap 8 5 - 15    Comment: Performed at [facility], [place] [address], [place], [place] [address]  Lipase, blood     Status: None   Collection Time: 08/06/18  3:49 PM  Result Value Ref Range   Lipase 36 11 - 51 U/L    Comment: Performed at [facility], [place] [address], [place], [place] [address]  CBC with Differential     Status: Abnormal   Collection Time: 08/06/18  3:49 PM  Result Value Ref Range   WBC 3.8 (L) 4.0 - 10.5 K/uL   RBC 2.44 (L) 4.22 - 5.81 MIL/uL   Hemoglobin 7.5 (L) 13.0 - 17.0 g/dL   HCT 25.4 (L) 39.0 - 52.0 %   MCV 104.1 (H) 80.0 - 100.0 fL   MCH 30.7 26.0 - 34.0 pg   MCHC 29.5 (L)  30.0 - 36.0 g/dL   RDW 15.4 11.5 - 15.5 %   Platelets 85 (L) 150 - 400 K/uL    Comment: REPEATED TO VERIFY PLATELET COUNT CONFIRMED BY SMEAR SPECIMEN CHECKED FOR CLOTS Immature Platelet Fraction may be clinically indicated, consider ordering this additional test [id number]    nRBC 0.0 0.0 - 0.2 %   Neutrophils Relative % 78 %   Neutro Abs 3.0 1.7 - 7.7 K/uL   Lymphocytes Relative 10 %   Lymphs Abs 0.4 (L) 0.7 - 4.0 K/uL   Monocytes Relative 9 %   Monocytes Absolute 0.3 0.1 - 1.0 K/uL   Eosinophils Relative 1 %   Eosinophils Absolute 0.0 0.0 - 0.5 K/uL   Basophils Relative 1 %   Basophils Absolute 0.0 0.0 - 0.1 K/uL   RBC Morphology See Note     Comment: ELLIPTOCYTES PRESENT   Immature Granulocytes 1 %   Abs Immature Granulocytes 0.02 0.00 - 0.07 K/uL   Ovalocytes PRESENT     Comment: Performed at [facility], [place] [address], [place], [place] [address]  Urinalysis, Routine w reflex microscopic     Status: Abnormal   Collection Time: 08/06/18  3:49 PM  Result Value Ref Range   Color, Urine YELLOW YELLOW   APPearance CLEAR CLEAR   Specific Gravity, Urine 1.021 1.005 - 1.030   pH 5.0 5.0 - 8.0   Glucose, UA NEGATIVE NEGATIVE mg/dL   Hgb urine dipstick NEGATIVE NEGATIVE   Bilirubin Urine NEGATIVE NEGATIVE   Ketones, ur 5 (A) NEGATIVE mg/dL   Protein, ur NEGATIVE NEGATIVE mg/dL   Nitrite NEGATIVE NEGATIVE   Leukocytes, UA NEGATIVE NEGATIVE    Comment: Performed at [facility] [address] [place], [place] [address]  CBC     Status: Abnormal   Collection Time: 08/06/18  6:52 PM  Result Value Ref Range   WBC 3.5 (L) 4.0 - 10.5 K/uL   RBC 2.30 (L) 4.22 - 5.81 MIL/uL   Hemoglobin 7.1 (L) 13.0 - 17.0 g/dL  HCT 23.8 (L) 39.0 - 52.0 %   MCV 103.5 (H) 80.0 - 100.0 fL   MCH 30.9 26.0 - 34.0 pg   MCHC 29.8 (L) 30.0 - 36.0 g/dL   RDW 15.3 11.5 - 15.5 %   Platelets 87 (L) 150 - 400 K/uL    Comment: Immature Platelet Fraction may  be clinically indicated, consider ordering this additional test [id number] CONSISTENT WITH PREVIOUS RESULT    nRBC 0.0 0.0 - 0.2 %    Comment: Performed at [facility], [place] [address], [place], [place] [address]  POC occult blood, ED     Status: None   Collection Time: 08/06/18  7:56 PM  Result Value Ref Range   Fecal Occult Bld NEGATIVE NEGATIVE   Ct Abdomen Pelvis W Contrast  Result Date: 08/06/2018 CLINICAL DATA:  Anemia. Recent fall. Evaluate for liver laceration. EXAM: CT ABDOMEN AND PELVIS WITH CONTRAST TECHNIQUE: Multidetector CT imaging of the abdomen and pelvis was performed using the standard protocol following bolus administration of intravenous contrast. CONTRAST:  141mL ISOVUE-300 IOPAMIDOL (ISOVUE-300) INJECTION 61% COMPARISON:  CT abdomen pelvis-12/27/2017; 10/15/2017 FINDINGS: Lower chest: Limited visualization of lower thorax demonstrates minimal dependent subpleural ground-glass atelectasis. No discrete focal airspace opacities. No pleural effusion. Borderline cardiomegaly. Small amount of pericardial fluid, presumably physiologic. Hepatobiliary: There is diffuse decreased attenuation of the hepatic parenchyma on this postcontrast examination. Postsurgical change involving the anterior aspects of the medial segment of the left lobe of the liver. No discrete hepatic lesions. Post cholecystectomy. No intra or extrahepatic biliary ductal dilatation. Grossly unchanged mesenteric congestion without evidence of ascites. Nonocclusive thrombus is seen within the portal vein at the level of the portal splenule confluence (image 26, series 2) minimally progressed compared to the 12/2017 examination. Pancreas: Normal appearance of the pancreas Spleen: The spleen is borderline enlarged measuring 13.3 cm in length (image 63, series 5). Adrenals/Urinary Tract: There is symmetric enhancement and excretion of the bilateral kidneys. There is an approximately 1.1 cm hypoattenuating  nonenhancing cyst involving the posterosuperior aspect the right kidney. No discrete left-sided renal lesions. No definite renal stones this postcontrast examination. No urinary obstruction or perinephric stranding. Normal appearance the bilateral adrenal glands. Normal appearance of the urinary bladder given degree distention. Stomach/Bowel: Bowel is normal in course and caliber without discrete area of wall thickening. Normal appearance of the terminal ileum. The appendix is not visualized however there is no definitive pericecal inflammatory change. Vascular/Lymphatic: Multiple esophageal varices are noted about the distal aspect of the esophagus (representative image 12, series 2). Tiny gastric varices are noted about the fundus (image 19, series 2). Moderate amount of atherosclerotic plaque with a normal caliber abdominal aorta, not definitely resulting in hemodynamically significant stenosis. The major branch vessels of the abdominal aorta appear patent on this non CTA examination though there is exuberant calcifications involving the origin of the celiac artery. No bulky retroperitoneal, mesenteric, pelvic or inguinal lymphadenopathy. Reproductive: Normal appearance of the prostate gland. Displaced surgical clip noted within the midline of the lower pelvis. No free fluid the pelvic cul-de-sac. Other: Regional soft tissues appear normal. Musculoskeletal: No acute or aggressive osseous abnormalities. 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 IMPRESSION: 1. Stigmata of cirrhosis and portal venous hypertension including splenomegaly and multiple enlarged esophageal and tiny gastric varices. Given history of anemia, further evaluation with endoscopy could be  performed as indicated. 2. Otherwise, no explanation for  patient's anemia. Specifically, no evidence liver or splenic laceration. 3. Similar findings of borderline splenomegaly and mesenteric congestion without evidence of ascites. 4. 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 5. Avascular necrosis of the bilateral femoral heads, left greater than right, without articular surface collapse or secondary degenerative change. 6. Interval cholecystectomy. 7. Borderline cardiomegaly. 8.  Aortic Atherosclerosis (ICD10-I70.0). Electronically Signed   By: Sandi Mariscal M.D.   On: 08/06/2018 18:28    Pending Labs Unresulted Labs (From admission, onward)   None      Vitals/Pain Today's Vitals   08/06/18 1443 08/06/18 1444 08/06/18 1700 08/06/18 1900  BP: (!) 124/57  119/68 (!) 142/73  Pulse: 89  87 77  Resp: 16  19 19   Temp: (!) 97.4 F (36.3 C)     TempSrc: Oral     SpO2: 100%  98% 100%  Weight: 74.4 kg     Height: 5\' 9"  (1.753 m)     PainSc:  0-No pain      Isolation Precautions No active isolations  Medications Medications  iopamidol (ISOVUE-300) 61 % injection (has no administration in time range)  sodium chloride (PF) 0.9 % injection (has no administration in time range)  iopamidol (ISOVUE-300) 61 % injection 100 mL (100 mLs Intravenous Contrast Given 08/06/18 1753)    Mobility walks

## 2018-08-06 NOTE — ED Triage Notes (Signed)
Pt arrived via [facility] from dr. Gabriel Carina Follow-up from last week hgb 7.9 known anemia Pt has fatigue and weakness, orthostatic changes. hgb 7.8  Fell and broke rib a couple weeks ago. Hx of liver problems. PCP concern for possble liver lac  20 L wrist 400 ml total fluid

## 2018-08-06 NOTE — Patient Instructions (Addendum)
  Depending on ER eval you may need to be admitted or follow up outpatient further testing   If you have lab work done today you will be contacted with your lab results within the next 2 weeks.  If you have not heard from [place] then please contact us. The fastest way to get your results is to register for My Chart.   IF you received an x-ray today, you will receive an invoice from [facility] Radiology. Please contact [facility] Radiology at [phone] with questions or concerns regarding your invoice.   IF you received labwork today, you will receive an invoice from [facility]. Please contact [facility] at [phone] with questions or concerns regarding your invoice.   Our billing staff will not be able to assist you with questions regarding bills from these companies.  You will be contacted with the lab results as soon as they are available. The fastest way to get your results is to activate your My Chart account. Instructions are located on the last page of this paperwork. If you have not heard from [place] regarding the results in 2 weeks, please contact this office.

## 2018-08-07 DIAGNOSIS — I951 Orthostatic hypotension: Secondary | ICD-10-CM | POA: Diagnosis not present

## 2018-08-07 LAB — CBC
HCT: 22.8 % — ABNORMAL LOW (ref 39.0–52.0)
Hemoglobin: 6.7 g/dL — CL (ref 13.0–17.0)
MCH: 29.8 pg (ref 26.0–34.0)
MCHC: 29.4 g/dL — ABNORMAL LOW (ref 30.0–36.0)
MCV: 101.3 fL — ABNORMAL HIGH (ref 80.0–100.0)
Platelets: 84 10*3/uL — ABNORMAL LOW (ref 150–400)
RBC: 2.25 MIL/uL — ABNORMAL LOW (ref 4.22–5.81)
RDW: 15.4 % (ref 11.5–15.5)
WBC: 3.8 10*3/uL — ABNORMAL LOW (ref 4.0–10.5)
nRBC: 0 % (ref 0.0–0.2)

## 2018-08-07 LAB — BASIC METABOLIC PANEL
Anion gap: 8 (ref 5–15)
BUN: 12 mg/dL (ref 8–23)
CO2: 21 mmol/L — ABNORMAL LOW (ref 22–32)
Calcium: 8.3 mg/dL — ABNORMAL LOW (ref 8.9–10.3)
Chloride: 110 mmol/L (ref 98–111)
Creatinine, Ser: 0.86 mg/dL (ref 0.61–1.24)
GFR calc Af Amer: >60 mL/min (ref >60)
GFR calc non Af Amer: >60 mL/min (ref >60)
Glucose, Bld: 93 mg/dL (ref 70–99)
Potassium: 4 mmol/L (ref 3.5–5.1)
Sodium: 139 mmol/L (ref 135–145)

## 2018-08-07 LAB — GLUCOSE, CAPILLARY
Glucose-Capillary: 89 mg/dL (ref 70–99)
Glucose-Capillary: 108 mg/dL — ABNORMAL HIGH (ref 70–99)
Glucose-Capillary: 78 mg/dL (ref 70–99)

## 2018-08-07 LAB — PREPARE RBC (CROSSMATCH)

## 2018-08-07 MED ORDER — SODIUM CHLORIDE 0.9% IV SOLUTION
Freq: Once | INTRAVENOUS | Status: AC
  Administered 2018-08-07: 11:00:00 via INTRAVENOUS

## 2018-08-07 MED ORDER — ENSURE ENLIVE PO LIQD
237.0000 mL | Freq: Two times a day (BID) | ORAL | Status: DC
  Administered 2018-08-07 – 2018-08-08 (×2): 237 mL via ORAL

## 2018-08-07 MED ORDER — ADULT MULTIVITAMIN W/MINERALS CH
1.0000 | ORAL_TABLET | Freq: Every day | ORAL | Status: DC
  Administered 2018-08-07 – 2018-08-08 (×2): 1 via ORAL
  Filled 2018-08-07 (×2): qty 1

## 2018-08-07 NOTE — Progress Notes (Signed)
OT Cancellation Note  Patient Details Name: [name] MRN: [id number] DOB: 23-Apr-1946   Cancelled Treatment:    Reason Eval/Treat Not Completed: Medical issues which prohibited therapy.  Pt to get blood.  Will check back later today or tomorrow  Sahar Ryback 08/07/2018, 10:12 AM  Lesle Chris, OTR/L Acute Rehabilitation Services [phone] [facility] pager [phone] office 08/07/2018

## 2018-08-07 NOTE — Progress Notes (Addendum)
CRITICAL VALUE ALERT  Critical Value:  Hgb 6.7  Date & Time Notied: 08/07/18 at 5:25 am  Provider Notified: K. Schorr  Orders Received/Actions taken:Type and Screen with order to transfuse 1 unit RBC's

## 2018-08-07 NOTE — Consult Note (Addendum)
   [facility] CM Inpatient Consult   08/07/2018  HARIM BI Jan 29, 1946 [id number]    Referral received from inpatient [facility] for [place] [facility] services for community resources, pharmacy support and complex case management.   Went to bedside to speak with Mr. Bonnie about [place] [employer] program.   Mr. Reiling agreeable to [place] [facility] and [facility] written consent obtained. [facility] folder provided.   Mr. Apollo endorses he lives alone. States he is interested in mobile meals.  Reports his neighbor sometimes takes him to MD appointments. Confirms Primary Care MD is Dr. Carlota Raspberry with Primary Care at [facility] (office listed as doing transition of care calls). Confirmed best contact number for Mr. Burry as [phone]. Mr. Ocain also states he has trouble affording his medications and has had some medication changes.  Mr. Councilman is agreeable to [place], [facility] Pharmacist, and [facility] Social Worker referrals.   Will make inpatient RNCM aware [facility] will follow post discharge.   Marthenia Rolling, MSN-Ed, RN,BSN [facility] Liaison [phone]    M

## 2018-08-07 NOTE — Progress Notes (Signed)
Patient Demographics:    [name], is a 72 y.o. male, DOB - [birth year], JTT:[id number]  Admit date - 08/06/2018   Admitting Physician Lenore Cordia, MD  Outpatient Primary MD for the patient is Wendie Agreste, MD  LOS - 0   Chief Complaint  Patient presents with  . Fatigue  . Abnormal Lab             Subjective:    [name] today has no fevers, no emesis,  No chest pain, dizziness has improved  Assessment  & Plan :    Principal Problem:   Orthostatic hypotension Active Problems:   Diabetes mellitus (HCC)   OSA (obstructive sleep apnea)   Cancer, hepatocellular (HCC)   Hypertension   Hyperlipidemia   Pancytopenia (HCC)  Brief Summary  72 y.o. 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, type 2 diabetes, hypertension, hyperlipidemia, history hepatitis B, Pancytopenia, and OSA admitted  from his PCP office for further evaluation of Anemia and orthostasis on 08/06/18  Plan: 1)Symptomatic Anemia in a patient with chronic pancytopenia in the setting of hepatocellular carcinoma with portal hypertension and esophageal and gastric varices --- transfuse 1 unit of packed cells  2) Falls at Home-rib fracture, requested PT eval  3)Orthostatic Hypotension--- --should improve with transfusion of packed cells, please see #1 above  4)DM2-hemoglobin A1c is 4.8, so we will stop metformin, Use Novolog/Humalog Sliding scale insulin with Accu-Cheks/Fingersticks as ordered .lasta  5)OSA-continue CPAP nightly  6) pancytopenia--in the setting of underlying hepatocellular carcinoma with chronic portal hypertension and esophageal and gastric varices and liver cirrhosis  Disposition/Need for in-Hospital Stay- patient unable to be discharged at this time due to significant orthostatic hypotension requiring  transfusion of PRBCs  Code Status : DNR  Disposition Plan  : home with [facility] due to history of falls  Consults  :  PT  DVT Prophylaxis  :   SCDs   Lab Results  Component Value Date   PLT 84 (L) 08/07/2018    Inpatient Medications  Scheduled Meds: . donepezil  10 mg Oral QHS  . feeding supplement (ENSURE ENLIVE)  237 mL Oral BID BM  . ferrous sulfate  325 mg Oral TID WC  . insulin aspart  0-9 Units Subcutaneous TID WC  . multivitamin with minerals  1 tablet Oral Daily  . pravastatin  20 mg Oral QHS  . sertraline  25 mg Oral Daily   Continuous Infusions: PRN Meds:.acetaminophen **OR** acetaminophen, ondansetron **OR** ondansetron (ZOFRAN) IV, polyethylene glycol    Anti-infectives (From admission, onward)   None        Objective:   Vitals:   08/07/18 0600 08/07/18 1115 08/07/18 1135 08/07/18 1342  BP: 105/69 113/62 105/60 122/63  Pulse: 87 80 76 72  Resp: 18 18 18    Temp: 98 F (36.7 C) 98 F (36.7 C) 98.5 F (36.9 C) 98.3 F (36.8 C)  TempSrc: Oral Oral Oral Oral  SpO2: 100% 100% 100% 100%  Weight:      Height:        Wt Readings from Last 3 Encounters:  08/06/18 74.4 kg  08/06/18 75.8 kg  08/01/18 76.3 kg     Intake/Output Summary (Last  24 hours) at 08/07/2018 1851 Last data filed at 08/07/2018 1846 Gross per 24 hour  Intake 2147.69 ml  Output 1000 ml  Net 1147.69 ml     Physical Exam Patient is examined daily including today on 08/07/18 , exams remain the same as of yesterday except that has changed   Gen:- Awake Alert,  In no apparent distress  HEENT:- [place].AT, No sclera icterus Neck-Supple Neck,No JVD,.  Lungs-  CTAB , fair symmetrical air movement CV- S1, S2 normal, regular  Abd-  +ve B.Sounds, Abd Soft, No tenderness,    Extremity/Skin:- No  edema, pedal pulses present  Psych-affect is appropriate, oriented x3 Neuro-no new focal deficits, no tremors, complains of orthostatic dizziness and unsteady gait   Data Review:   Micro  Results No results found for this or any previous visit (from the past 240 hour(s)).  Radiology Reports Ct Abdomen Pelvis W Contrast  Result Date: 08/06/2018 CLINICAL DATA:  Anemia. Recent fall. Evaluate for liver laceration. EXAM: CT ABDOMEN AND PELVIS WITH CONTRAST TECHNIQUE: Multidetector CT imaging of the abdomen and pelvis was performed using the standard protocol following bolus administration of intravenous contrast. CONTRAST:  155mL ISOVUE-300 IOPAMIDOL (ISOVUE-300) INJECTION 61% COMPARISON:  CT abdomen pelvis-12/27/2017; 10/15/2017 FINDINGS: Lower chest: Limited visualization of lower thorax demonstrates minimal dependent subpleural ground-glass atelectasis. No discrete focal airspace opacities. No pleural effusion. Borderline cardiomegaly. Small amount of pericardial fluid, presumably physiologic. Hepatobiliary: There is diffuse decreased attenuation of the hepatic parenchyma on this postcontrast examination. Postsurgical change involving the anterior aspects of the medial segment of the left lobe of the liver. No discrete hepatic lesions. Post cholecystectomy. No intra or extrahepatic biliary ductal dilatation. Grossly unchanged mesenteric congestion without evidence of ascites. Nonocclusive thrombus is seen within the portal vein at the level of the portal splenule confluence (image 26, series 2) minimally progressed compared to the 12/2017 examination. Pancreas: Normal appearance of the pancreas Spleen: The spleen is borderline enlarged measuring 13.3 cm in length (image 63, series 5). Adrenals/Urinary Tract: There is symmetric enhancement and excretion of the bilateral kidneys. There is an approximately 1.1 cm hypoattenuating nonenhancing cyst involving the posterosuperior aspect the right kidney. No discrete left-sided renal lesions. No definite renal stones this postcontrast examination. No urinary obstruction or perinephric stranding. Normal appearance the bilateral adrenal glands. Normal  appearance of the urinary bladder given degree distention. Stomach/Bowel: Bowel is normal in course and caliber without discrete area of wall thickening. Normal appearance of the terminal ileum. The appendix is not visualized however there is no definitive pericecal inflammatory change. Vascular/Lymphatic: Multiple esophageal varices are noted about the distal aspect of the esophagus (representative image 12, series 2). Tiny gastric varices are noted about the fundus (image 19, series 2). Moderate amount of atherosclerotic plaque with a normal caliber abdominal aorta, not definitely resulting in hemodynamically significant stenosis. The major branch vessels of the abdominal aorta appear patent on this non CTA examination though there is exuberant calcifications involving the origin of the celiac artery. No bulky retroperitoneal, mesenteric, pelvic or inguinal lymphadenopathy. Reproductive: Normal appearance of the prostate gland. Displaced surgical clip noted within the midline of the lower pelvis. No free fluid the pelvic cul-de-sac. Other: Regional soft tissues appear normal. Musculoskeletal: No acute or aggressive osseous abnormalities. 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 IMPRESSION: 1. Stigmata of cirrhosis and portal venous hypertension including  splenomegaly and multiple enlarged esophageal and tiny gastric varices. Given history of anemia, further evaluation with endoscopy could be performed as indicated. 2. Otherwise, no explanation for patient's anemia. Specifically, no evidence liver or splenic laceration. 3. Similar findings of borderline splenomegaly and mesenteric congestion without evidence of ascites. 4. Nonocclusive thrombus at the level of the portal splenic confluence, slightly progressed  compared to the 12/2017 examination. 5. Avascular necrosis of the bilateral femoral heads, left greater than right, without articular surface collapse or secondary degenerative change. 6. Interval cholecystectomy. 7. Borderline cardiomegaly. 8.  Aortic Atherosclerosis (ICD10-I70.0). Electronically Signed   By: Sandi Mariscal M.D.   On: 08/06/2018 18:28     CBC Recent Labs  Lab 08/04/18 1202 08/06/18 1241 08/06/18 1549 08/06/18 1852 08/07/18 0338  WBC 3.9* 3.8* 3.8* 3.5* 3.8*  HGB 7.8* 7.9* 7.5* 7.1* 6.7*  HCT 23.5* 24.1* 25.4* 23.8* 22.8*  PLT  --   --  85* 87* 84*  MCV 94.3 94.6 104.1* 103.5* 101.3*  MCH 31.3* 31.0 30.7 30.9 29.8  MCHC 33.2 32.8 29.5* 29.8* 29.4*  RDW  --   --  15.4 15.3 15.4  LYMPHSABS  --   --  0.4*  --   --   MONOABS  --   --  0.3  --   --   EOSABS  --   --  0.0  --   --   BASOSABS  --   --  0.0  --   --     Chemistries  Recent Labs  Lab 08/06/18 1549 08/07/18 0338  NA 138 139  K 5.0 4.0  CL 111 110  CO2 19* 21*  GLUCOSE 113* 93  BUN 11 12  CREATININE 1.03 0.86  CALCIUM 8.3* 8.3*  AST 41  --   ALT 30  --   ALKPHOS 121  --   BILITOT 1.7*  --    ------------------------------------------------------------------------------------------------------------------ No results for input(s): CHOL, HDL, LDLCALC, TRIG, CHOLHDL, LDLDIRECT in the last 72 hours.  Lab Results  Component Value Date   HGBA1C 4.8 07/30/2018   ------------------------------------------------------------------------------------------------------------------ No results for input(s): TSH, T4TOTAL, T3FREE, THYROIDAB in the last 72 hours.  Invalid input(s): FREET3 ------------------------------------------------------------------------------------------------------------------ Recent Labs    08/06/18 2200  VITAMINB12 171*    Roxan Hockey M.D on 08/07/2018 at 6:51 PM  Pager---[phone] Go to [email or web address] - password [email or web address] for contact info  [facility] - Office   [phone]

## 2018-08-07 NOTE — Progress Notes (Signed)
Pt refused CPAP qhs again tonight.  Pt encouraged to contact RT should he change his mind.

## 2018-08-07 NOTE — Progress Notes (Signed)
PT Cancellation Note  Patient Details Name: [name] MRN: [id number] DOB: 10-Feb-1946   Cancelled Treatment:    Reason Eval/Treat Not Completed: Medical issues which prohibited therapy(low Hgb, to get blood, will check back as schedule permits.)   Toron Bowring,KATHrine E 08/07/2018, 10:54 AM Carmelia Bake, PT, DPT Acute Rehabilitation Services Office: [phone] Pager: [phone]

## 2018-08-07 NOTE — Care Management (Signed)
CM consult. This CM has contacted [facility] to provide community resources at discharge and continued follow up.  Marney Doctor RN,BSN [phone]

## 2018-08-07 NOTE — Evaluation (Signed)
Physical Therapy One Time Evaluation Patient Details Name: [name] MRN: [id number] DOB: Aug 28, 1945 Today's Date: 08/07/2018   History of Present Illness  72 y.o. male with medical history significant for hepatocellular carcinoma, type 2 diabetes, hypertension, hyperlipidemia, hepatitis B, pancytopenia, and OSA presents from his PCP office for further evaluation of symptomatic anemia  Clinical Impression  Patient evaluated by Physical Therapy with no further acute PT needs identified. All education has been completed and the patient has no further questions.  Pt received one unit PRBCs and very agreeable to mobilize.  Pt reports he ambulated better then he has in a long time and did not have any symptoms.  Pt states he usually is very fatigued and has been having more trouble with IADLs.  Pt also states he sometimes goes a day without eating because "it's too hard to get up and make something."  Pt would benefit from any increased home care such as an aide, if possible. See below for any follow-up Physical Therapy or equipment needs. PT is signing off. Thank you for this referral.     Follow Up Recommendations No PT follow up(would benefit from aide if possible)    Equipment Recommendations  None recommended by PT    Recommendations for Other Services       Precautions / Restrictions Precautions Precautions: Fall      Mobility  Bed Mobility Overal bed mobility: Modified Independent                Transfers Overall transfer level: Needs assistance Equipment used: None Transfers: Sit to/from Stand Sit to Stand: Min guard         General transfer comment: min/guard provided as pt reports hx of dizziness upon first standing (states he normally waits a few minutes at home before standing, which also performed here and pt denies any symptoms, pt also s/p PRBCs)  Ambulation/Gait Ambulation/Gait assistance: Min guard;Supervision Gait Distance (Feet): 400  Feet Assistive device: None Gait Pattern/deviations: Step-through pattern     General Gait Details: slow but steady gait, pt denies any symptoms, pt reports normally he fatigues quickly however felt good to ambulate today  Stairs            Wheelchair Mobility    Modified Rankin (Stroke Patients Only)       Balance Overall balance assessment: History of Falls                                           Pertinent Vitals/Pain Pain Assessment: No/denies pain    Home Living Family/patient expects to be discharged to:: Private residence Living Arrangements: Alone     Home Access: Level entry     Home Layout: One level Home Equipment: Cane - single point      Prior Function Level of Independence: Independent with assistive device(s)         Comments: uses a SPC "around the house"     Hand Dominance        Extremity/Trunk Assessment        Lower Extremity Assessment Lower Extremity Assessment: Overall WFL for tasks assessed    Cervical / Trunk Assessment Cervical / Trunk Assessment: Normal  Communication   Communication: HOH  Cognition Arousal/Alertness: Awake/alert Behavior During Therapy: WFL for tasks assessed/performed Overall Cognitive Status: Within Functional Limits for tasks assessed  General Comments      Exercises     Assessment/Plan    PT Assessment Patent does not need any further PT services  PT Problem List         PT Treatment Interventions      PT Goals (Current goals can be found in the Care Plan section)  Acute Rehab PT Goals PT Goal Formulation: All assessment and education complete, DC therapy    Frequency     Barriers to discharge        Co-evaluation               AM-PAC PT "6 Clicks" Mobility  Outcome Measure Help needed turning from your back to your side while in a flat bed without using bedrails?: None Help needed moving  from lying on your back to sitting on the side of a flat bed without using bedrails?: None Help needed moving to and from a bed to a chair (including a wheelchair)?: None Help needed standing up from a chair using your arms (e.g., wheelchair or bedside chair)?: A Little Help needed to walk in hospital room?: A Little Help needed climbing 3-5 steps with a railing? : A Little 6 Click Score: 21    End of Session Equipment Utilized During Treatment: Gait belt Activity Tolerance: Patient tolerated treatment well Patient left: in bed;with call bell/phone within reach Nurse Communication: Mobility status PT Visit Diagnosis: Unsteadiness on feet (R26.81)    Time: 4037-5436 PT Time Calculation (min) (ACUTE ONLY): 22 min   Charges:   PT Evaluation $PT Eval Low Complexity: [place], PT, DPT Acute Rehabilitation Services Office: [phone] Pager: [phone]   Trena Platt 08/07/2018, 3:24 PM

## 2018-08-07 NOTE — Progress Notes (Signed)
Initial Nutrition Assessment  DOCUMENTATION CODES:   (Will assess for malnutrition at follow-up.)  INTERVENTION:  - Will order Ensure Enlive po BID, each supplement provides 350 kcal and 20 grams of protein - Will order daily multivitamin with minerals. - Continue to encourage PO intakes.    NUTRITION DIAGNOSIS:   Increased nutrient needs related to chronic illness, catabolic illness, cancer and cancer related treatments as evidenced by estimated needs.  GOAL:   Patient will meet greater than or equal to 90% of their needs  MONITOR:   PO intake, Supplement acceptance, Weight trends, Labs  REASON FOR ASSESSMENT:   Malnutrition Screening Tool  ASSESSMENT:   72 y.o. male with medical history significant for hepatocellular carcinoma, type 2 DM, HTN, hyperlipidemia, hepatitis B, pancytopenia, and OSA. He presented to the ED from his PCP office for further evaluation of anemia and orthostasis. Patient reported a fall at home about 2 months ago and was found to have a nondisplaced fracture of his right T10 rib; he has had intermittent pain at the site since that time. He has been getting lightheaded when he stands up quickly, but denies passing out or further falls. At his PCP's office he was noted to have a hemoglobin of 7.9 but was tachycardic and orthostatic in the office. He has been on iron supplementation and reports dark stool.  BMI indicates normal weight. Per chart review, patient consumed 100% of breakfast this AM; patient confirms this and denies any abdominal pain or nausea with breakfast. He states he was feeling hungry this AM but that he has had a decreased appetite the past few months d/t chemo treatment, lightheadedness, and also from pain following fall in October with associated rib fx. He states that if he eats too much he does begin to feel pressure in rib cage area.   Per chart review, current weight is 164 lb and weight on 10/30 was 174 lb. This indicates 10 lb  weight loss (5.7% body weight) in the past 1.5 months; not significant for time frame. On 9/16 he weighed 181 lb which indicates 17 lb weight loss (9.4% body weight)  In the past 3 months; significant for time frame.    Medications reviewed; 325 mg ferrous sulfate TID, sliding scale Novolog.  Labs reviewed; Ca: 8.3 mg/dL.    NUTRITION - FOCUSED PHYSICAL EXAM:  Will attempt at follow-up.  Diet Order:   Diet Order            Diet heart healthy/carb modified Room service appropriate? Yes; Fluid consistency: Thin  Diet effective now              EDUCATION NEEDS:   No education needs have been identified at this time  Skin:  Skin Assessment: Reviewed RN Assessment  Last BM:  PTA/unknown  Height:   Ht Readings from Last 1 Encounters:  08/06/18 5\' 9"  (1.753 m)    Weight:   Wt Readings from Last 1 Encounters:  08/06/18 74.4 kg    Ideal Body Weight:  72.73 kg  BMI:  Body mass index is 24.22 kg/m.  Estimated Nutritional Needs:   Kcal:  2200-2400 kcal  Protein:  115-125 grams  Fluid:  >/= 2.2 L/day     Jarome Matin, MS, RD, LDN, [facility] Inpatient Clinical Dietitian Pager # [phone] After hours/weekend pager # [phone]

## 2018-08-08 DIAGNOSIS — I951 Orthostatic hypotension: Secondary | ICD-10-CM | POA: Diagnosis not present

## 2018-08-08 LAB — CBC
HCT: 25.2 % — ABNORMAL LOW (ref 39.0–52.0)
Hemoglobin: 7.8 g/dL — ABNORMAL LOW (ref 13.0–17.0)
MCH: 31.3 pg (ref 26.0–34.0)
MCHC: 31 g/dL (ref 30.0–36.0)
MCV: 101.2 fL — ABNORMAL HIGH (ref 80.0–100.0)
Platelets: 77 10*3/uL — ABNORMAL LOW (ref 150–400)
RBC: 2.49 MIL/uL — ABNORMAL LOW (ref 4.22–5.81)
RDW: 15.7 % — ABNORMAL HIGH (ref 11.5–15.5)
WBC: 4 10*3/uL (ref 4.0–10.5)
nRBC: 0 % (ref 0.0–0.2)

## 2018-08-08 LAB — TYPE AND SCREEN
ABO/RH(D): B POS
Antibody Screen: NEGATIVE
Unit division: 0

## 2018-08-08 LAB — GLUCOSE, CAPILLARY
Glucose-Capillary: 99 mg/dL (ref 70–99)
Glucose-Capillary: 188 mg/dL — ABNORMAL HIGH (ref 70–99)

## 2018-08-08 LAB — BPAM RBC
Blood Product Expiration Date: 202001202359
ISSUE DATE / TIME: 201912191107
Unit Type and Rh: 7300

## 2018-08-08 MED ORDER — ENSURE ENLIVE PO LIQD: 237.0000 mL | Freq: Two times a day (BID) | ORAL | 12 refills | Status: DC

## 2018-08-08 MED ORDER — FERROUS SULFATE 325 (65 FE) MG PO TABS: 325.0000 mg | ORAL_TABLET | Freq: Three times a day (TID) | ORAL | 3 refills | Status: DC

## 2018-08-08 MED ORDER — SPIRONOLACTONE 25 MG PO TABS: 25.0000 mg | ORAL_TABLET | Freq: Every day | ORAL | 1 refills | Status: DC

## 2018-08-08 MED ORDER — ONDANSETRON HCL 4 MG PO TABS: 4.0000 mg | ORAL_TABLET | Freq: Four times a day (QID) | ORAL | 0 refills | Status: DC | PRN

## 2018-08-08 MED ORDER — ADULT MULTIVITAMIN W/MINERALS CH: 1.0000 | ORAL_TABLET | Freq: Every day | ORAL | 2 refills | Status: DC

## 2018-08-08 NOTE — Discharge Instructions (Signed)
1)Avoid ibuprofen/Advil/Aleve/Motrin/Goody Powders/Naproxen/BC powders/Meloxicam/Diclofenac/Indomethacin and other Nonsteroidal anti-inflammatory medications as these will make you more likely to bleed and can cause stomach ulcers, can also cause Kidney problems.   2)Repeat CBC/complete blood count with your doctor on Monday, 08/11/2018  3) stop metformin due to tendency to was low blood sugar  4)Take all your other medications as Prescribed

## 2018-08-08 NOTE — Care Management Obs Status (Signed)
[facility] NOTIFICATION   Patient Details  Name: [name] MRN: [id number] Date of Birth: [birth year]   Medicare Observation Status Notification Given:  Yes    Lynnell Catalan, RN 08/08/2018, 12:18 PM

## 2018-08-08 NOTE — Discharge Summary (Signed)
[name], is a 72 y.o. male  DOB 1946/05/14  MRN [id number].  Admission date:  08/06/2018  Admitting Physician  Lenore Cordia, MD  Discharge Date:  08/08/2018   Primary MD  Wendie Agreste, MD  Recommendations for primary care physician for things to follow:   1)Avoid ibuprofen/Advil/Aleve/Motrin/Goody Powders/Naproxen/BC powders/Meloxicam/Diclofenac/Indomethacin and other Nonsteroidal anti-inflammatory medications as these will make you more likely to bleed and can cause stomach ulcers, can also cause Kidney problems.   2)Repeat CBC/complete blood count with your doctor on Monday, 08/11/2018  3) stop metformin due to tendency towards low blood sugar  4)Take all your other medications as Prescribed  Admission Diagnosis  Symptomatic anemia [D64.9]   Discharge Diagnosis  Symptomatic anemia [D64.9]    Principal Problem:   Orthostatic hypotension Active Problems:   Diabetes mellitus (HCC)   OSA (obstructive sleep apnea)   Cancer, hepatocellular (HCC)   Hypertension   Hyperlipidemia   Pancytopenia ([place])      Past Medical History:  Diagnosis Date  . Cancer [facility])    hepatocellular cancer   . Diabetes mellitus without complication ([place])   . Dyspnea   . Hepatitis B   . Hyperlipidemia   . Hypertension   . Irregular heart beat 01/28/2018  . Narcolepsy    per office visit note of 08/2011   . Sleep apnea    cpap    Past Surgical History:  Procedure Laterality Date  . CHOLECYSTECTOMY N/A 01/28/2018   Procedure: LAPAROSCOPIC CHOLECYSTECTOMY;  Surgeon: [name], MD;  Location: [facility];  Service: General;  Laterality: N/A;  . COLONOSCOPY WITH PROPOFOL N/A 08/27/2017   Procedure: COLONOSCOPY WITH PROPOFOL;  Surgeon: Clarene Essex, MD;  Location: [facility];  Service: Endoscopy;  Laterality: N/A;  . HOT HEMOSTASIS N/A 08/27/2017   Procedure: HOT HEMOSTASIS (ARGON PLASMA  COAGULATION/BICAP);  Surgeon: Clarene Essex, MD;  Location: [facility];  Service: Endoscopy;  Laterality: N/A;  . LAPAROSCOPIC CHOLECYSTECTOMY  01/28/2018  . LAPAROSCOPY  03/02/2013   Procedure: LAPAROSCOPY DIAGNOSTIC;  Surgeon: [name], MD;  [facility];  Service: General;;  . LIVER ULTRASOUND  03/02/2013   Procedure: LIVER ULTRASOUND;  Surgeon: [name], MD;  [facility];  Service: General;;  . OPEN PARTIAL HEPATECTOMY   03/02/2013   Procedure: OPEN PARTIAL HEPATECTOMY [83];  Surgeon: [name], MD;  [facility];  Service: General;;  DX LAPAROSCOPY, INTRAOPERATIVE LIVER ULTRASOUND, OPEN PARTIAL HEPATECTOMY  . pinched nerve in back       HPI  from the history and physical done on the day of admission:    72 y.o. male with medical history significant for hepatocellular carcinoma, type 2 diabetes, hypertension, hyperlipidemia, history hepatitis B, pancytopenia, and OSA presents from his PCP office for further evaluation of anemia and orthostasis.  Patient reports a fall at home about 2 months ago and was found to have a nondisplaced fracture of his right T10 rib laterally.  He has had continued intermittent pain at that site since then.  He has  been getting lightheaded when he stands up quickly, but denies passing out or further falls.  He says if he takes his time to get up he does not have this lightheadedness.  He presented to his PCP office earlier today for fatigue, dizziness, and anemia evaluation.  He was noted to have a hemoglobin of 7.9 but was tachycardic and orthostatic in the office.  He has been on iron supplementation and reports dark stool which he attributes to his iron pills.  He otherwise denies any obvious bleeding including epistaxis, hemoptysis, hematemesis, hematuria, or hematochezia.  ED Course:  Initial vitals showed BP 124/57, pulse 89, RR 16, temp 97.4 Fahrenheit, SPO2 100% on room air.  Orthostatic vitals were positive.   Labs are notable for WBC  3.8, hemoglobin 7.5  (7.9 three hours prior, trending down from ~10 over the last year), MCV 104, platelets 85k (baseline ~90-100k).  FOBT was negative.  CT abdomen/pelvis with contrast showed changes consistent with cirrhosis and portal venous hypertension including splenomegaly and multiple large esophageal, gastric varices.  A nonocclusive thrombus at the level of the portal splenic confluence was noted, slightly progressed compared to May 2019 exam.  The hospital service was consulted for further evaluation management of orthostasis and anemia.   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, type 2 diabetes, hypertension, hyperlipidemia, history hepatitis B, Pancytopenia, and OSA admitted  from his PCP office for further evaluation of Anemia and orthostasis on 08/06/18   Plan: 1)Symptomatic Anemia in a patient with chronic pancytopenia in the setting of hepatocellular carcinoma with portal hypertension and esophageal and gastric varices --- stool occult blood is neg, he received  1 unit of packed cells, hemoglobin went up from 6.7 to 7.8, hemodynamics improved, CT abdomen and pelvis without any acute findings to explain patient's anemia, loss of chronic changes and findings in the hepatobiliary areas  2) Falls at Home-rib fracture--- Xrays demonstrated nondisplaced right 10th rib fracture laterally, PT evaluation appreciated , will discharge home with home health services  3)Orthostatic Hypotension--- --resolved after transfusion of packed cells, please see #1 above, decrease Aldactone to once daily from twice daily  4)DM2-hemoglobin A1c is 4.8, stopped metformin, at risk for hypoglycemia,   5)OSA-continue CPAP nightly  6)Pancytopenia--in the setting of underlying hepatocellular carcinoma with chronic portal hypertension and esophageal and gastric varices  and liver cirrhosis--no evidence of ongoing bleeding  Code Status : DNR  Disposition Plan  : home with [facility] services due to history of falls and rib fracture  Consults  :  PT  Discharge Condition: stable  Follow UP-PCP for repeat CBC on 08/11/2018   Diet and Activity recommendation:  As advised  Discharge Instructions    Discharge Instructions    AMB Referral to [place] Management   Complete by:  As directed    Please assign to [facility] for complex case management, DM. [facility] Pharmacist for med affordability and review. [facility] Social Worker for [employer]. Written consent obtained. Currently at [facility]. Please call with questions. Thanks. Marthenia Rolling, [place], [facility] [phone]   Reason for consult:  Please assign to [place]   Diagnoses of:  Diabetes   Expected date of contact:  1-3 days (reserved for hospital discharges)   Call MD for:  difficulty breathing, headache or visual disturbances   Complete by:  As directed    Call MD for:  persistant  dizziness or light-headedness   Complete by:  As directed    Call MD for:  persistant nausea and vomiting   Complete by:  As directed    Call MD for:  severe uncontrolled pain   Complete by:  As directed    Call MD for:  temperature >100.4   Complete by:  As directed    Diet - low sodium heart healthy   Complete by:  As directed    Diet Carb Modified   Complete by:  As directed    Discharge instructions   Complete by:  As directed    1)Avoid ibuprofen/Advil/Aleve/Motrin/Goody Powders/Naproxen/BC powders/Meloxicam/Diclofenac/Indomethacin and other Nonsteroidal anti-inflammatory medications as these will make you more likely to bleed and can cause stomach ulcers, can also cause Kidney problems.   2)Repeat CBC/complete blood count with your doctor on Monday, 08/11/2018  3) stop metformin due to tendency to was low blood sugar  4)Take all your other medications as Prescribed     Increase activity slowly   Complete by:  As directed         Discharge Medications     Allergies as of 08/08/2018      Reactions   Ace Inhibitors Swelling, Other (See Comments)   Angioedema - face.       Medication List    STOP taking these medications   metFORMIN 500 MG tablet Commonly known as:  GLUCOPHAGE   traMADol 50 MG tablet Commonly known as:  ULTRAM     TAKE these medications   ACCU-CHEK SOFTCLIX LANCETS lancets Once per day testing.   alfuzosin 10 MG 24 hr tablet Commonly known as:  UROXATRAL Take 1 tablet (10 mg total) by mouth at bedtime.   amphetamine-dextroamphetamine 30 MG 24 hr capsule Commonly known as:  ADDERALL XR Take daily by mouth with water 30 minutes before breakfast.   blood glucose meter kit and supplies Dispense based on patient and insurance preference. Use up to four times daily as directed. (FOR ICD-9 250.00, 250.01).   donepezil 10 MG tablet Commonly known as:  ARICEPT Take 1 tablet (10 mg total) by mouth at bedtime.   feeding supplement (ENSURE ENLIVE) Liqd Take 237 mLs by mouth 2 (two) times daily between meals.   ferrous sulfate 325 (65 FE) MG tablet Commonly known as:  KP FERROUS SULFATE Take 1 tablet (325 mg total) by mouth 3 (three) times daily with meals.   glucose blood test strip Commonly known as:  ACCU-CHEK AVIVA PLUS 1 each by Other route as needed for other. Use as instructed - once per day testing for now.   multivitamin with minerals Tabs tablet Take 1 tablet by mouth daily. Start taking on:  August 09, 2018   ondansetron 4 MG tablet Commonly known as:  ZOFRAN Take 1 tablet (4 mg total) by mouth every 6 (six) hours as needed for nausea or vomiting.   pravastatin 20 MG tablet Commonly known as:  PRAVACHOL Take 1 tablet (20 mg total) by mouth at bedtime.   sertraline 25 MG tablet Commonly known as:  ZOLOFT Take 1 tablet (25 mg total) by mouth daily.   spironolactone 25 MG tablet Commonly known as:   ALDACTONE Take 1 tablet (25 mg total) by mouth daily. What changed:  when to take this      Major procedures and Radiology Reports - PLEASE review detailed and final reports for all details, in brief -   Ct Abdomen Pelvis W Contrast  Result Date: 08/06/2018 CLINICAL DATA:  Anemia. Recent fall. Evaluate for liver laceration. EXAM: CT ABDOMEN AND PELVIS WITH CONTRAST TECHNIQUE: Multidetector CT imaging of the abdomen and pelvis was performed using the standard protocol following bolus administration of intravenous contrast. CONTRAST:  137m ISOVUE-300 IOPAMIDOL (ISOVUE-300) INJECTION 61% COMPARISON:  CT abdomen pelvis-12/27/2017; 10/15/2017 FINDINGS: Lower chest: Limited visualization of lower thorax demonstrates minimal dependent subpleural ground-glass atelectasis. No discrete focal airspace opacities. No pleural effusion. Borderline cardiomegaly. Small amount of pericardial fluid, presumably physiologic. Hepatobiliary: There is diffuse decreased attenuation of the hepatic parenchyma on this postcontrast examination. Postsurgical change involving the anterior aspects of the medial segment of the left lobe of the liver. No discrete hepatic lesions. Post cholecystectomy. No intra or extrahepatic biliary ductal dilatation. Grossly unchanged mesenteric congestion without evidence of ascites. Nonocclusive thrombus is seen within the portal vein at the level of the portal splenule confluence (image 26, series 2) minimally progressed compared to the 12/2017 examination. Pancreas: Normal appearance of the pancreas Spleen: The spleen is borderline enlarged measuring 13.3 cm in length (image 63, series 5). Adrenals/Urinary Tract: There is symmetric enhancement and excretion of the bilateral kidneys. There is an approximately 1.1 cm hypoattenuating nonenhancing cyst involving the posterosuperior aspect the right kidney. No discrete left-sided renal lesions. No definite renal stones this postcontrast examination. No  urinary obstruction or perinephric stranding. Normal appearance the bilateral adrenal glands. Normal appearance of the urinary bladder given degree distention. Stomach/Bowel: Bowel is normal in course and caliber without discrete area of wall thickening. Normal appearance of the terminal ileum. The appendix is not visualized however there is no definitive pericecal inflammatory change. Vascular/Lymphatic: Multiple esophageal varices are noted about the distal aspect of the esophagus (representative image 12, series 2). Tiny gastric varices are noted about the fundus (image 19, series 2). Moderate amount of atherosclerotic plaque with a normal caliber abdominal aorta, not definitely resulting in hemodynamically significant stenosis. The major branch vessels of the abdominal aorta appear patent on this non CTA examination though there is exuberant calcifications involving the origin of the celiac artery. No bulky retroperitoneal, mesenteric, pelvic or inguinal lymphadenopathy. Reproductive: Normal appearance of the prostate gland. Displaced surgical clip noted within the midline of the lower pelvis. No free fluid the pelvic cul-de-sac. Other: Regional soft tissues appear normal. Musculoskeletal: No acute or aggressive osseous abnormalities. 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 IMPRESSION: 1. Stigmata of cirrhosis and portal venous hypertension including splenomegaly and multiple enlarged esophageal and tiny gastric varices. Given history of anemia, further evaluation with endoscopy could be performed as indicated. 2. Otherwise, no explanation for patient's anemia. Specifically, no evidence liver or splenic laceration. 3. Similar findings of borderline splenomegaly and mesenteric congestion without evidence of  ascites. 4. 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 5. Avascular necrosis of the bilateral femoral heads, left greater than right, without articular surface collapse or secondary degenerative change. 6. Interval cholecystectomy. 7. Borderline cardiomegaly. 8.  Aortic Atherosclerosis (ICD10-I70.0). Electronically Signed   By: JSandi MariscalM.D.   On: 08/06/2018 18:28    Micro Results   Today   Subjective    RSaverio Dankertoday has no new complaints, no significant orthostatic dizziness, no chest pains, no palpitations no fevers or chills eating and drinking okay          Patient has  been seen and examined prior to discharge   Objective   Blood pressure 104/60, pulse 78, temperature 98.8 F (37.1 C), temperature source Oral, resp. rate 15, height _0  (1.753 m), weight 74.4 kg, SpO2 100 %.   Intake/Output Summary (Last 24 hours) at 08/08/2018 1411 Last data filed at 08/08/2018 0930 Gross per 24 hour  Intake 780 ml  Output 600 ml  Net 180 ml    Exam Gen:- Awake Alert, no acute distress  HEENT:- [place].AT, No sclera icterus Neck-Supple Neck,No JVD,.  Lungs-  CTAB , good air movement bilaterally , tenderness over right 10th rib area laterally CV- S1, S2 normal, regular Abd-  +ve B.Sounds, Abd Soft, No tenderness,    Extremity/Skin:- No  edema,   good pulses Psych-affect is appropriate, oriented x3 Neuro-no new focal deficits, no tremors    Data Review   CBC w Diff:  Lab Results  Component Value Date   WBC 4.0 08/08/2018   HGB 7.8 (L) 08/08/2018   HGB WILL FOLLOW 07/30/2018   HGB 10.4 (L) 08/15/2017   HCT 25.2 (L) 08/08/2018   HCT WILL FOLLOW 07/30/2018   HCT 31.8 (L) 08/15/2017   PLT 77 (L) 08/08/2018   PLT WILL FOLLOW 07/30/2018   LYMPHOPCT 10 08/06/2018   LYMPHOPCT 24.3 08/15/2017   MONOPCT 9 08/06/2018   MONOPCT 10.4 08/15/2017   EOSPCT 1 08/06/2018   EOSPCT 1.7 08/15/2017    BASOPCT 1 08/06/2018   BASOPCT 0.6 08/15/2017    CMP:  Lab Results  Component Value Date   NA 139 08/07/2018   NA 142 07/30/2018   NA 138 09/03/2016   K 4.0 08/07/2018   K 4.4 09/03/2016   CL 110 08/07/2018   CO2 21 (L) 08/07/2018   CO2 19 (L) 09/03/2016   BUN 12 08/07/2018   BUN 10 07/30/2018   BUN 13.2 09/03/2016   CREATININE 0.86 08/07/2018   CREATININE 1.25 10/15/2017   CREATININE 1.1 09/03/2016   PROT 6.4 (L) 08/06/2018   PROT 6.7 07/30/2018   PROT 7.8 03/03/2014   ALBUMIN 3.1 (L) 08/06/2018   ALBUMIN 3.5 07/30/2018   ALBUMIN 3.7 03/03/2014   BILITOT 1.7 (H) 08/06/2018   BILITOT 0.9 07/30/2018   BILITOT 1.0 10/15/2017   BILITOT 0.75 03/03/2014   ALKPHOS 121 08/06/2018   ALKPHOS 77 03/03/2014   AST 41 08/06/2018   AST 31 10/15/2017   AST 36 (H) 03/03/2014   ALT 30 08/06/2018   ALT 31 10/15/2017   ALT 32 03/03/2014  .   Total Discharge time is about 33 minutes  Roxan Hockey M.D on 08/08/2018 at 2:11 PM  Pager---[phone]  Go to [email or web address] - password [email or web address] for contact info  [facility] - Office  [phone]

## 2018-08-08 NOTE — Evaluation (Signed)
Occupational Therapy Evaluation Patient Details Name: [name] MRN: [id number] DOB: [birth year] Today's Date: 08/08/2018    History of Present Illness 72 y.o. male with medical history significant for hepatocellular carcinoma, type 2 diabetes, hypertension, hyperlipidemia, hepatitis B, pancytopenia, and OSA presents from his PCP office for further evaluation of symptomatic anemia   Clinical Impression   Pt was admitted for the above. At baseline, he is mod I but reports he just does the best he can and let's IADLs go at times.  He currently needs min guard A overall.  Plan is home today. Would benefit from continued OT if another service is present (as HHOT cannot stand alone)    Follow Up Recommendations  Home health OT if another service goes in as HHOT cannot stand alone   Equipment Recommendations  None recommended by OT    Recommendations for Other Services       Precautions / Restrictions Precautions Precautions: Fall Restrictions Weight Bearing Restrictions: No      Mobility Bed Mobility Overal bed mobility: Modified Independent             General bed mobility comments: flat bed  Transfers   Equipment used: None;Rolling walker (2 wheeled)   Sit to Stand: Min guard              Balance Overall balance assessment: History of Falls                                         ADL either performed or assessed with clinical judgement   ADL Overall ADL's : Needs assistance/impaired                         Toilet Transfer: Min guard;Transfer board;BSC;RW             General ADL Comments: uses a cane at home.  Set up for adls, sit to stand. Performed dressing.  Pt tends to change positions quickly; encouraged him to pause at eob before standing.  Pt has a reacher at home to retrieve things. Socks take extra time and effort. Educated on pacing and taking his time, breaking down activities and sitting when possible.        Vision         Perception     Praxis      Pertinent Vitals/Pain Pain Assessment: No/denies pain     Hand Dominance     Extremity/Trunk Assessment Upper Extremity Assessment Upper Extremity Assessment: Overall WFL for tasks assessed           Communication Communication Communication: HOH   Cognition Arousal/Alertness: Awake/alert Behavior During Therapy: WFL for tasks assessed/performed Overall Cognitive Status: Within Functional Limits for tasks assessed                                     General Comments       Exercises     Shoulder Instructions      Home Living Family/patient expects to be discharged to:: Private residence Living Arrangements: Alone     Home Access: Level entry     Home Layout: One level     Bathroom Shower/Tub: Teacher, early years/pre: Standard     Home Equipment: Cane - single point   Additional Comments: doesn't  drive; asks neighbor to pick up food.  Does the best he can with IADLs.  Has been sponge bathing for awhile      Prior Functioning/Environment          Comments: uses a SPC "around the house".  [place] [address] twice recently   Unsure if he blacked out.  Not dizzy this session         OT Problem List:        OT Treatment/Interventions:      OT Goals(Current goals can be found in the care plan section) Acute Rehab OT Goals Patient Stated Goal: home  OT Goal Formulation: All assessment and education complete, DC therapy  OT Frequency:     Barriers to D/C:            Co-evaluation              AM-PAC OT "6 Clicks" Daily Activity     Outcome Measure Help from another person eating meals?: None Help from another person taking care of personal grooming?: A Little Help from another person toileting, which includes using toliet, bedpan, or urinal?: A Little Help from another person bathing (including washing, rinsing, drying)?: A Little Help from another person to put on and  taking off regular upper body clothing?: A Little Help from another person to put on and taking off regular lower body clothing?: A Little 6 Click Score: 19   End of Session    Activity Tolerance: Patient tolerated treatment well Patient left: in chair;with call bell/phone within reach  OT Visit Diagnosis: Unsteadiness on feet (R26.81)                Time: 7588-3254 OT Time Calculation (min): 23 min Charges:  OT General Charges $OT Visit: 1 Visit OT Evaluation $OT Eval Low Complexity: [place], OTR/L Acute Rehabilitation Services [phone] [facility] pager [phone] office 08/08/2018  Eltha Tingley 08/08/2018, 3:34 PM

## 2018-08-11 ENCOUNTER — Other Ambulatory Visit: Payer: Self-pay | Admitting: *Deleted

## 2018-08-11 ENCOUNTER — Other Ambulatory Visit: Payer: Self-pay

## 2018-08-11 ENCOUNTER — Ambulatory Visit: Payer: Self-pay | Admitting: Pharmacist

## 2018-08-11 ENCOUNTER — Other Ambulatory Visit: Payer: Self-pay | Admitting: Pharmacist

## 2018-08-11 NOTE — Patient Outreach (Signed)
[place] [facility]) Care Management  [place]  08/11/2018  [name] 04/05/1946 [id number]   Reason for call: medication management  Unsuccessful telephone call attempt #1 to patient.   Unable to leave message. No VM set up.  Plan:  I will make another outreach attempt to patient within 3-4 business days  I will arrange home visit with [place] if schedule aligns   Regina Eck, PharmD, [place]  [phone]

## 2018-08-11 NOTE — Patient Outreach (Signed)
[place] [facility]) Care Management  08/11/2018  DOM HAVERLAND 03-03-1946 [id number]   Initial outreach to patient regarding social work referral for [employer].  BSW informed Mr. Dudash that a referral can be submitted to the [employer] program and, unfortunately, there is a wait list of approximately six months.  BSW inquired about his insurance coverage as [employer] and [employer] are listed in [place].  Mr. Fill reported that he only has [employer]. Therefore, [employer] meals cannot be requested from [facility].  BSW agreed to utilize gift card from [employer] and [employer] for him.  Mr. Plucinski requested that they be delivered tomorrow morning.    [name], BSW Social Worker [phone]

## 2018-08-11 NOTE — Patient Outreach (Signed)
[place] [facility]) Care Management  08/11/2018  LYSANDER CALIXTE 11-03-1945 [id number]   Referral received from hospital liaison as member was recently admitted to hospital 12/18-12/20 for symptomatic anemia and orthostatic hypotension.  Primary MD office will complete transition of care assessment.  Per chart, he also has history of hypertension, hepatitis B, liver cancer, diabetes, BPH, mild cognitive impairment with memory loss.    Call placed to member, identity verified.  This care manager introduced self and purpose of call, [facility] care management services explained.  He report he is doing well, denies any complaints.  Noted he has follow up appointment on 12/30, state he will ask family/friend to provide transportation.  This care manager attempted to complete telephone assessment and establish care plan, however his phone was not cooperating. He attempted to call this care manager back twice but was still having difficulty hearing the conversation.  This care manager was able to schedule home visit for this week, will complete assessment and establish individualized care plan at that time.  Valente David, [place], MSN [place] [phone]

## 2018-08-12 ENCOUNTER — Other Ambulatory Visit: Payer: Self-pay

## 2018-08-12 NOTE — Patient Outreach (Signed)
[facility]) Care Management  08/12/2018  [name] 1945/10/28 [id number]   BSW utilized gift cards from [employer] to purchase groceries for [name].  Groceries were delivered to him today.  BSW provided him with [employer] [place] [employer] [place] which list local food pantries and kitchens.  BSW informed him that he has been added to the wait list for mobile meals although he may not qualify because he is not home bound.  BSW is closing case at this time but encouraged him to call if other social work needs arise.     [name], BSW Social Worker [phone]

## 2018-08-14 ENCOUNTER — Other Ambulatory Visit: Payer: Self-pay | Admitting: Pharmacist

## 2018-08-14 ENCOUNTER — Ambulatory Visit: Payer: Self-pay | Admitting: Pharmacist

## 2018-08-14 ENCOUNTER — Other Ambulatory Visit: Payer: Self-pay

## 2018-08-14 NOTE — Patient Outreach (Addendum)
[facility]) Care Management   08/14/2018  [name] 1946-06-10 [id number]  [name] is an 73 y.o. male  Subjective: Member alert and oriented x4. Denies pain. MD completing Transition of Care Assessment.   Objective:  BP 118/62 (BP Location: Right Arm, Patient Position: Sitting, Cuff Size: Normal)   Pulse 74   Resp 18   Ht 1.753 m (_0 )   SpO2 98%   BMI 24.22 kg/m    Review of Systems  Constitutional: Positive for weight loss.  HENT: Positive for hearing loss.   Eyes: Negative.   Respiratory: Negative.   Cardiovascular: Negative.   Gastrointestinal: Negative.   Genitourinary: Negative.   Neurological: Negative.   Endo/Heme/Allergies: Negative.   Psychiatric/Behavioral: Negative.     Physical Exam  Constitutional: He is oriented to person, place, and time. He appears well-developed and well-nourished.  HENT:  Head: Normocephalic.  Neck: Normal range of motion. Neck supple.  Cardiovascular: Normal rate, regular rhythm, normal heart sounds and intact distal pulses.  Respiratory: Effort normal and breath sounds normal.  GI: Soft. Bowel sounds are normal.  Musculoskeletal: Normal range of motion.  Neurological: He is alert and oriented to person, place, and time.  Skin: Skin is warm and dry.  Psychiatric: He has a normal mood and affect.   Encounter Medications:   Outpatient Encounter Medications as of 08/14/2018  Medication Sig  . ACCU-CHEK SOFTCLIX LANCETS lancets Once per day testing.  [name] Kitchen alfuzosin (UROXATRAL) 10 MG 24 hr tablet Take 1 tablet (10 mg total) by mouth at bedtime.  [name] Kitchen amphetamine-dextroamphetamine (ADDERALL XR) 30 MG 24 hr capsule Take daily by mouth with water 30 minutes before breakfast.  . blood glucose meter kit and supplies Dispense based on patient and insurance preference. Use up to four times daily as directed. (FOR ICD-9 250.00, 250.01).  [name] Kitchen donepezil (ARICEPT) 10 MG tablet Take 1 tablet (10 mg total) by  mouth at bedtime.  . feeding supplement, ENSURE ENLIVE, (ENSURE ENLIVE) LIQD Take 237 mLs by mouth 2 (two) times daily between meals.  . ferrous sulfate (KP FERROUS SULFATE) 325 (65 FE) MG tablet Take 1 tablet (325 mg total) by mouth 3 (three) times daily with meals.  [name] Kitchen glucose blood (ACCU-CHEK AVIVA PLUS) test strip 1 each by Other route as needed for other. Use as instructed - once per day testing for now.  . Multiple Vitamin (MULTIVITAMIN WITH MINERALS) TABS tablet Take 1 tablet by mouth daily.  . ondansetron (ZOFRAN) 4 MG tablet Take 1 tablet (4 mg total) by mouth every 6 (six) hours as needed for nausea or vomiting.  . pravastatin (PRAVACHOL) 20 MG tablet Take 1 tablet (20 mg total) by mouth at bedtime.  . sertraline (ZOLOFT) 25 MG tablet Take 1 tablet (25 mg total) by mouth daily.  [name] Kitchen spironolactone (ALDACTONE) 25 MG tablet Take 1 tablet (25 mg total) by mouth daily.   No facility-administered encounter medications on file as of 08/14/2018.     Functional Status:   In your present state of health, do you have any difficulty performing the following activities: 08/14/2018 08/06/2018  Hearing? Y Y  Comment has hearing aide -  Vision? N N  Difficulty concentrating or making decisions? Y N  Comment sometimes per member -  Walking or climbing stairs? Tempie Donning  Comment walks with cane -  Dressing or bathing? Y Y  Doing errands, shopping? N N  Preparing Food and eating ? Y -  Comment at times -  Using the Toilet? N -  In the past six months, have you accidently leaked urine? N -  Do you have problems with loss of bowel control? N -  Managing your Medications? N -  Comment denies difficulty taking medications from bottles -  Managing your Finances? N -  Housekeeping or managing your Housekeeping? N -  Some recent data might be hidden    Fall/Depression Screening:    Fall Risk  08/14/2018 08/06/2018 08/01/2018  Falls in the past year? 1 0 1  Comment - - -  Number falls in past yr: 1 -  1  Comment - - -  Injury with Fall? 1 - 1  Comment - - -  Risk Factor Category  - - -  Risk for fall due to : History of fall(s) - History of fall(s)  Risk for fall due to: Comment went to bed, 0100 wanted some water, turned left , went into bathroom, hit head, couldn't get up, laid there until got enough strength to push up, this happened on sunday, went to MD on Monday and was prescribed pain meds. Not still taking pain meds - -  Follow up Falls evaluation completed;Falls prevention discussed;Education provided - Falls prevention discussed   PHQ 2/9 Scores 08/14/2018 08/06/2018 08/01/2018 07/30/2018 06/18/2018 05/09/2018 04/17/2018  PHQ - 2 Score 0 0 0 0 0 0 0  PHQ- 9 Score - - - - - - -    Assessment:  Referral received from Preceptor RN on 08-11-3018 for recent hospital discharge and assessment of chronic condition: Anemia . Visited member at home today.  Almyra Free, Pharmacist on site to provide medication education and assistance to member.  72 year old with PMH: Recent observation admission due to symptomatic anemia requiring blood transfusion. PMH: Orthostatic hypotension; HTN; OSA; post op respiratory failure; liver tumor; hepatocellular cancer; DM; mild cognitive impairment with memory loss; Chronic confusional state; BPH; Pancytopenia; CPAP; Narcolepsy.  Confirmed with member follow up appointments (to include next PCP appointment on 08-18-2018 and Lab on 08-22-2018) and hospital discharge instructions. Member denies concerns about transportation and states that his family and friends assist with transportation and member also has own car in yard. SW assisting member with groceries being delivered to [employer] home and placing member on waiting list for mobile meals.   Member requesting Life Alert due to c/o recent fall at home with broken rib and lack of caregiver support in the home. Member states he is unable to prepare own meals at times due to weakness and reports >5lbs weight loss in last  3 months.   Plan: Will follow up with [employer] insurance company: [employer] Medicare and include member on call within 1 week concerning Life Alert.   [facility] CM Care Plan Problem One     Most Recent Value  Care Plan Problem One  Risk for readmission r/t Knowledge deficit regarding causative factors with disease: Anemia  Role Documenting the Problem One  Care Management Coordinator  Care Plan for Problem One  Active  THN Long Term Goal   Member will describe known facts about Anemia and treatment plan within next 45 days  [facility]N Long Term Goal Start Date  08/14/18  Interventions for Problem One Long Term Goal  Assessed member's knowledge of anemia, s/s relating to anemia, when to contact MD or call 911  [facility] CM Short Term Goal #1   Member will discuss HGB levels with PCP at next office visit.  [facility] CM Short Term Goal #1 Start Date  08/14/18  Interventions for Short Term Goal #1  Educated member about Hemoglobin levels and relativity to anemia,  Assess c/o fatigue,  pharmacist present to discuss all medications including iron  [facility] CM Short Term Goal #2   Member will not experience s/s of anemia within next 30 days  [facility] CM Short Term Goal #2 Start Date  08/14/18  Interventions for Short Term Goal #2  Reinforced information on hospital discharge instruction sheet: avoid Ibuprofen,  Educated member on NSAIDS use and side effects      [name] [employer], [place] Management Coordinator  [phone] [email or web address]

## 2018-08-14 NOTE — Patient Outreach (Signed)
[place] [facility]) Care Management  [place]   08/14/2018  [name] 25-Oct-1945 [id number]  Reason for referral: Medication Management   Referral source: [facility] Inpatient Liaison Current insurance:[employer]  PMHx includes but not limited to:  Dementia, T2DM, HTN, HLD narcolepsy, BPH, hepatocellular cancer  Outreach:  Successful home visit with [name].  HIPAA identifiers verified. [facility] CM RNs were also present during this visit.  Medications were reviewed per patient and most recent AVS.  Patient states he is 100% compliant with medications, however I'm concerned about his cognitive ability based on certain interactions during interview.  His medications were mixed with medications that had been discontinued or old.  He was also out of his donepezil.  For the most part, he was able to recall his medications and purpose.  He voiced interest in compliance packaging.  Reviewed medications that had recently changed and also medications that were continued.  Objective: Lab Results  Component Value Date   CREATININE 0.86 08/07/2018   CREATININE 1.03 08/06/2018   CREATININE 1.06 07/30/2018    Lab Results  Component Value Date   HGBA1C 4.8 07/30/2018    Lipid Panel     Component Value Date/Time   CHOL 152 11/07/2017 1750   TRIG 91 11/07/2017 1750   HDL 53 11/07/2017 1750   CHOLHDL 2.9 11/07/2017 1750   CHOLHDL 2.6 12/14/2015 1337   VLDL 18 12/14/2015 1337   LDLCALC 81 11/07/2017 1750    BP Readings from Last 3 Encounters:  08/08/18 104/60  08/06/18 (!) 98/57  08/01/18 120/62    Allergies  Allergen Reactions  . Ace Inhibitors Swelling and Other (See Comments)    Angioedema - face.     Medications Reviewed Today    Reviewed by Lavera Guise, [facility] (Pharmacist) on 08/14/18 at 1338  Med List Status: <None>  Medication Order Taking? Sig Documenting Provider Last Dose Status Informant  ACCU-CHEK SOFTCLIX LANCETS lancets [id number]  Yes Once per day testing. Wendie Agreste, MD Taking Active Self  alfuzosin (UROXATRAL) 10 MG 24 hr tablet [id number] Yes Take 1 tablet (10 mg total) by mouth at bedtime. Wendie Agreste, MD Taking Active Self  amphetamine-dextroamphetamine (ADDERALL XR) 30 MG 24 hr capsule [id number] Yes Take daily by mouth with water 30 minutes before breakfast. Dohmeier, Asencion Partridge, MD Taking Active Self  blood glucose meter kit and supplies [id number] Yes Dispense based on patient and insurance preference. Use up to four times daily as directed. (FOR ICD-9 250.00, 250.01). Wendie Agreste, MD Taking Active Self  donepezil (ARICEPT) 10 MG tablet [id number]  Take 1 tablet (10 mg total) by mouth at bedtime. Dohmeier, Asencion Partridge, MD  Active Self  feeding supplement, ENSURE ENLIVE, (ENSURE ENLIVE) LIQD [id number] Yes Take 237 mLs by mouth 2 (two) times daily between meals. Roxan Hockey, MD Taking Active   ferrous sulfate (KP FERROUS SULFATE) 325 (65 FE) MG tablet [id number] Yes Take 1 tablet (325 mg total) by mouth 3 (three) times daily with meals. Roxan Hockey, MD Taking Active   glucose blood (ACCU-CHEK AVIVA PLUS) test strip [id number]  1 each by Other route as needed for other. Use as instructed - once per day testing for now. Wendie Agreste, MD  Active Self  Multiple Vitamin (MULTIVITAMIN WITH MINERALS) TABS tablet [id number] Yes Take 1 tablet by mouth daily. Roxan Hockey, MD Taking Active   ondansetron (ZOFRAN) 4 MG tablet [id number] Yes Take 1 tablet (4 mg total) by mouth every 6 (six) hours  as needed for nausea or vomiting. Roxan Hockey, MD Taking Active   pravastatin (PRAVACHOL) 20 MG tablet [id number] Yes Take 1 tablet (20 mg total) by mouth at bedtime. Wendie Agreste, MD Taking Active Self  sertraline (ZOLOFT) 25 MG tablet [id number] Yes Take 1 tablet (25 mg total) by mouth daily. Wendie Agreste, MD Taking Active Self  spironolactone (ALDACTONE) 25 MG tablet [id number] Yes Take 1 tablet (25 mg total)  by mouth daily. Roxan Hockey, MD Taking Active           Assessment:  Drugs sorted by system:  Neurologic/Psychologic:  sertraline, donepezil, amphetamine/daxtroamphetamine  Cardiovascular: spironolactone, pravastatin  Gastrointestinal: ondansetron  Genitourinary: afluzosin  Vitamins/Minerals/Supplements: MVI, ferrous sulfate  Medication Review Findings:  . Metformin discontinued at last hospitalization--removed from patients daily pill box and set to the side . Patient states that adderall (generic) is expensive, however he only takes as needed on days that he needs to be alert-->will research insurance & copay . Removed all medications that were held or discontinued from main pill box and moved to the side per patient's request.  Patient did not want Pharmacist to removed medications from home. [name] Kitchen Refill called into [facility] for donepzil   Plan: -Will follow up with patient after PCP visit to discuss switching to compliance packaging.   Regina Eck, PharmD, [place]  [phone]

## 2018-08-18 ENCOUNTER — Encounter: Payer: Self-pay | Admitting: Family Medicine

## 2018-08-18 ENCOUNTER — Telehealth: Payer: Self-pay | Admitting: Family Medicine

## 2018-08-18 ENCOUNTER — Other Ambulatory Visit: Payer: Self-pay

## 2018-08-18 ENCOUNTER — Ambulatory Visit (INDEPENDENT_AMBULATORY_CARE_PROVIDER_SITE_OTHER): Payer: Medicare Other | Admitting: Family Medicine

## 2018-08-18 VITALS — BP 107/63 | HR 82 | Temp 98.6°F | Ht 69.0 in | Wt 169.6 lb

## 2018-08-18 DIAGNOSIS — D61818 Other pancytopenia: Secondary | ICD-10-CM

## 2018-08-18 DIAGNOSIS — D649 Anemia, unspecified: Secondary | ICD-10-CM | POA: Diagnosis not present

## 2018-08-18 LAB — CBC
Hematocrit: 25.3 % — ABNORMAL LOW (ref 37.5–51.0)
Hemoglobin: 8.4 g/dL — ABNORMAL LOW (ref 13.0–17.7)
MCH: 30 pg (ref 26.6–33.0)
MCHC: 33.2 g/dL (ref 31.5–35.7)
MCV: 90 fL (ref 79–97)
Platelets: 131 10*3/uL — ABNORMAL LOW (ref 150–450)
RBC: 2.8 x10E6/uL — ABNORMAL LOW (ref 4.14–5.80)
RDW: 16.1 % — ABNORMAL HIGH (ref 12.3–15.4)
WBC: 3.5 10*3/uL (ref 3.4–10.8)

## 2018-08-18 NOTE — Telephone Encounter (Signed)
Pt. Came into office and was seen today 08/18/18. Per Dr. Vonna Kotyk request a hold has been placed at 1:40pm on Monday 08/25/18. The pt. Is to be seen on that day. If at all possible please edit the template and schedule an appt. For the pt.

## 2018-08-18 NOTE — Patient Instructions (Addendum)
Home physical therapy should help with strength but that may take some time.  I will recheck blood count today. If any new lightheadedness or dizziness, or feeling worse - go immediately to the emergency room.   If any increasing pain in side, we can look at other treatment options if needed.   Ensure ok if needed as meal replacement, but try to eat other meals throughout the day. Meals on wheels should help, but see the little green book and blue book for resources. If there are questions on resources - please reach out to Education officer, museum or nurse from [facility].   Recheck with me in 1 week for blood counts and blood pressure check.   Return to the clinic or go to the nearest emergency room if any of your symptoms worsen or new symptoms occur.   If you have lab work done today you will be contacted with your lab results within the next 2 weeks.  If you have not heard from [place] then please contact us. The fastest way to get your results is to register for My Chart.   IF you received an x-ray today, you will receive an invoice from [facility]. Please contact [facility] at [phone] with questions or concerns regarding your invoice.   IF you received labwork today, you will receive an invoice from [facility]. Please contact [facility] at [phone] with questions or concerns regarding your invoice.   Our billing staff will not be able to assist you with questions regarding bills from these companies.  You will be contacted with the lab results as soon as they are available. The fastest way to get your results is to activate your My Chart account. Instructions are located on the last page of this paperwork. If you have not heard from [place] regarding the results in 2 weeks, please contact this office.

## 2018-08-18 NOTE — Progress Notes (Addendum)
By signing my name below, I, Schuyler Bain, attest that this documentation has been prepared under the direction and in the presence of Dr. Carlota Raspberry. Electronically Signed: Baldwin Jamaica, Medical Scribe 08/18/18   Subjective:    Patient ID: [name], male    DOB: 1946-02-20, 72 y.o.   MRN: [id number] Chief Complaint  Patient presents with  . Hospitalization Follow-up   HPI [name] is a 72 y.o. male who presents to Primary Care at [facility] for a hospital follow up. I last saw him on 08/06/18 with symptomatic anemia at that time, BP of 98/57, and a HGB of 7.9. Had been experiencing fatigue, and adjusted diabetes meds as well as BP meds previously, with decreasing Metformin to 575m BID and stopping Losartan. He was admitted !2/18 through 12/20. Metformin was discontinued due to tendency towards lower blood sugars. Initial HGB in ED was 7.5, negative FOBT. 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 His anemia was thought to be part of his chronic pancytopenia with history of hepatocellular carcinoma. He received one unit of PRBC and HGB increased from 6.7 (on 08/07/18) to 7.8.   His Aldactone was decreased to once daily. There was no apparent evidence of ongoing bleeding, plan for outpatient CBC monitoring. He did undergo PT eval with home health planned for prior right tenth rib fracture [facility] He was seen by community resources last week.   I also previously started the pt on Ensure for increased nutrition. The pt notes that he has been able to start Ensure and believes this has helped his energy levels. He notes that he is not eating other meals throughout the day, but he has been recently referred to Meals on Wheels which he notes will begin in 6 months. He was also given information on local resources for help with food. He notes that in 6 months he will be receiving some help at home with his  meals. The pt has his social worker's contact information and has been encouraged to reach out to them again.   The pt notes that he has been doing better since he left the hospital, though he "hasn't built up his strength yet." He has continued working with PT at home. The pt denies feeling that there is anything additional he needs at home. The pt notes that he does not live with anyone at home.  He is on iron supplementation 2-3 times per day.  The pt denies any blood in the urine or blood in the stools. He denies feeling light headed or dizziness, unless he stands up too quickly.   The pt notes that he continues to have intermittent pain in his lower right rib. He notes that the frequency of this pain is improving, as is the severity.   The pt also notes that he "needs to drink more water."   Patient Active Problem List   Diagnosis Date Noted  . Orthostatic hypotension 08/06/2018  . Hypertension 08/06/2018  . Hyperlipidemia 08/06/2018  . Pancytopenia ([place] 08/06/2018  . 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 Insufficient treatment with nasal CPAP 05/05/2018  . 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 Irregular heart beat 01/28/2018  . Noncompliance with CPAP treatment 08/14/2017  . Excessive daytime sleepiness 04/09/2017  . Snoring 04/09/2017  . Memory impairment of gradual onset 04/09/2017  . Aortic atherosclerosis ([place] 11/27/2016  . 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 Altered mental status 03/05/2013  . Respiratory failure, post-operative ([place]) 03/02/2013  . Hepatitis B 02/24/2013  . Cancer, hepatocellular ([place]) 02/10/2013  . Liver tumor-bleeding 01/15/2013  . Epidermal cyst 06/18/2012  . OSA (obstructive sleep apnea) 03/13/2012  . Abnormal leg movement 12/12/2011  . Blood pressure elevated 09/06/2011  . Diabetes mellitus ([place]) 09/06/2011  . 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 BPH (benign  prostatic hyperplasia) 09/06/2011  . Diverticula of colon 09/06/2011   Past Medical History:  Diagnosis Date  . Cancer [facility])    hepatocellular cancer   . Diabetes mellitus without complication ([place])   . Dyspnea   . Hepatitis B   . Hyperlipidemia   . Hypertension   . Irregular heart beat 01/28/2018  . Narcolepsy    per office visit note of 08/2011   . Sleep apnea    cpap   Past Surgical History:  Procedure Laterality Date  . CHOLECYSTECTOMY N/A 01/28/2018   Procedure: LAPAROSCOPIC CHOLECYSTECTOMY;  Surgeon: [name], MD;  Location: [place];  Service: General;  Laterality: N/A;  . COLONOSCOPY WITH PROPOFOL N/A 08/27/2017   Procedure: COLONOSCOPY WITH PROPOFOL;  Surgeon: Clarene Essex, MD;  Location: [facility];  Service: Endoscopy;  Laterality: N/A;  . HOT HEMOSTASIS N/A 08/27/2017   Procedure: HOT HEMOSTASIS (ARGON PLASMA COAGULATION/BICAP);  Surgeon: Clarene Essex, MD;  Location[facility];  Service: Endoscopy;  Laterality: N/A;  . LAPAROSCOPIC CHOLECYSTECTOMY  01/28/2018  . LAPAROSCOPY  03/02/2013   Procedure: LAPAROSCOPY DIAGNOSTIC;  Surgeon: [name], MD;  Location: [facility];  Service: General;;  . LIVER ULTRASOUND  03/02/2013   Procedure: LIVER ULTRASOUND;  Surgeon: [name], MD;  Location: [facility];  Service: General;;  . OPEN PARTIAL HEPATECTOMY   03/02/2013   Procedure: OPEN PARTIAL HEPATECTOMY [83];  Surgeon: [name], MD;  Location: [facility];  Service: General;;  DX LAPAROSCOPY, INTRAOPERATIVE LIVER ULTRASOUND, OPEN PARTIAL HEPATECTOMY  . pinched nerve in back     Allergies  Allergen Reactions  . Ace Inhibitors Swelling and Other (See Comments)    Angioedema - face.    Prior to Admission medications   Medication Sig Start Date End Date Taking? Authorizing Provider  ACCU-CHEK SOFTCLIX LANCETS lancets Once per day testing. 08/05/17  Yes Wendie Agreste, MD  alfuzosin (UROXATRAL) 10 MG 24 hr tablet Take 1 tablet (10 mg total) by mouth at bedtime. 06/11/18  Yes  Wendie Agreste, MD  amphetamine-dextroamphetamine (ADDERALL XR) 30 MG 24 hr capsule Take daily by mouth with water 30 minutes before breakfast. 05/05/18  Yes Dohmeier, Asencion Partridge, MD  blood glucose meter kit and supplies Dispense based on patient and insurance preference. Use up to four times daily as directed. (FOR ICD-9 250.00, 250.01). 04/25/17  Yes Wendie Agreste, MD  donepezil (ARICEPT) 10 MG tablet Take 1 tablet (10 mg total) by mouth at bedtime. 05/05/18  Yes Dohmeier, Asencion Partridge, MD  feeding supplement, ENSURE ENLIVE, (ENSURE ENLIVE) LIQD Take 237 mLs by mouth 2 (two) times daily between meals. 08/08/18  Yes Emokpae, Courage, MD  ferrous sulfate (KP FERROUS SULFATE) 325 (65 FE) MG tablet Take 1 tablet (325 mg total) by mouth 3 (three) times daily with meals. 08/08/18  Yes Emokpae, Courage, MD  glucose blood (ACCU-CHEK AVIVA PLUS) test strip 1 each by Other route as needed for other. Use as instructed - once per day testing for now. 07/30/18  Yes Wendie Agreste, MD  Multiple Vitamin (MULTIVITAMIN WITH MINERALS) TABS tablet Take 1 tablet by  mouth daily. 08/09/18  Yes Emokpae, Courage, MD  ondansetron (ZOFRAN) 4 MG tablet Take 1 tablet (4 mg total) by mouth every 6 (six) hours as needed for nausea or vomiting. 08/08/18  Yes Emokpae, Courage, MD  pravastatin (PRAVACHOL) 20 MG tablet Take 1 tablet (20 mg total) by mouth at bedtime. 06/11/18  Yes Wendie Agreste, MD  sertraline (ZOLOFT) 25 MG tablet Take 1 tablet (25 mg total) by mouth daily. 07/30/18  Yes Wendie Agreste, MD  spironolactone (ALDACTONE) 25 MG tablet Take 1 tablet (25 mg total) by mouth daily. 08/08/18  Yes Roxan Hockey, MD   Social History   Socioeconomic History  . Marital status: Divorced    Spouse name: Not on file  . Number of children: 2  . Years of education: 15+  . Highest education level: Bachelor's degree (e.g., BA, AB, BS)  Occupational History  . Not on file  Social Needs  . Financial resource strain: Very  hard  . Food insecurity:    Worry: Sometimes true    Inability: Sometimes true  . Transportation needs:    Medical: No    Non-medical: No  Tobacco Use  . Smoking status: Former Smoker    Years: 5.00    Types: Cigarettes  . Smokeless tobacco: Never Used  Substance and Sexual Activity  . Alcohol use: No    Frequency: Never  . Drug use: No  . Sexual activity: Not Currently  Lifestyle  . Physical activity:    Days per week: 0 days    Minutes per session: 0 min  . Stress: Not at all  Relationships  . Social connections:    Talks on phone: Once a week    Gets together: Once a week    Attends religious service: 1 to 4 times per year    Active member of club or organization: No    Attends meetings of clubs or organizations: Never    Relationship status: Divorced  . Intimate partner violence:    Fear of current or ex partner: Not on file    Emotionally abused: Not on file    Physically abused: Not on file    Forced sexual activity: Not on file  Other Topics Concern  . Not on file  Social History Narrative   Patient is single and lives alone- became widower when 1 child was 64 year old, divorced 2nd marriage   Has #2 grown children, not in area or involved in his life   Patient is retired.   Patient has a college education.   Patient is right-handed.   Patient drinks maybe two sodas daily.   Review of Systems  Respiratory: Negative for chest tightness and shortness of breath.   Cardiovascular: Negative for chest pain.  Gastrointestinal: Negative for blood in stool.  Genitourinary: Negative for hematuria.  Neurological: Negative for dizziness and light-headedness.       Objective:   Physical Exam Vitals signs reviewed.  Constitutional:      General: He is not in acute distress.    Appearance: Normal appearance. He is well-developed. He is not diaphoretic.  HENT:     Head: Normocephalic and atraumatic.  Eyes:     Pupils: Pupils are equal, round, and reactive to  light.  Neck:     Vascular: No carotid bruit or JVD.  Cardiovascular:     Rate and Rhythm: Normal rate and regular rhythm.     Heart sounds: Normal heart sounds. No murmur. No gallop.  Pulmonary:     Effort: Pulmonary effort is normal.     Breath sounds: Normal breath sounds. No rales.  Abdominal:     General: Abdomen is flat. Bowel sounds are normal.     Palpations: Abdomen is soft.     Comments: Slight tenderness along the right lateral lower ribs. Gray-turner negative.  Skin:    General: Skin is warm and dry.  Neurological:     Mental Status: He is alert and oriented to person, place, and time.  Psychiatric:        Mood and Affect: Mood normal.        Behavior: Behavior normal.        Thought Content: Thought content normal.        Judgment: Judgment normal.    Vitals:   08/18/18 1135  BP: 107/63  Pulse: 82  Temp: 98.6 F (37 C)  TempSrc: Oral  SpO2: 100%  Weight: 169 lb 9.6 oz (76.9 kg)  Height: '5\' 9"'  (1.753 m)    Results for orders placed or performed in visit on 08/18/18  CBC  Result Value Ref Range   WBC 3.5 3.4 - 10.8 x10E3/uL   RBC 2.80 (L) 4.14 - 5.80 x10E6/uL   Hemoglobin 8.4 (L) 13.0 - 17.7 g/dL   Hematocrit 25.3 (L) 37.5 - 51.0 %   MCV 90 79 - 97 fL   MCH 30.0 26.6 - 33.0 pg   MCHC 33.2 31.5 - 35.7 g/dL   RDW 16.1 (H) 12.3 - 15.4 %   Platelets 131 (L) 150 - 450 x10E3/uL       Assessment & Plan:  EMIN FOREE is a 72 y.o. male Other pancytopenia ([place]) - Plan: CBC  Symptomatic anemia - Plan: CBC  Status post hospitalization, has improved after transfusion, tolerating oral iron intake.  Repeat CBC obtained which is improving.  Plan on recheck in 1 week.  Borderline blood pressure, but asymptomatic at present.  Recheck 1 week and consider decrease spironolactone dosing.  ER precautions given in the meantime.  Has resources at home now with social work, physical therapy.  Meal options discussed again including green book/Blue book.  Rib  pain is improving, if prescription meds needed advised to let me know but with previous liver issues and prior fatigue/dizziness, would be cautious with pain relievers.  No orders of the defined types were placed in this encounter.  Patient Instructions   Home physical therapy should help with strength but that may take some time.  I will recheck blood count today. If any new lightheadedness or dizziness, or feeling worse - go immediately to the emergency room.   If any increasing pain in side, we can look at other treatment options if needed.   Ensure ok if needed as meal replacement, but try to eat other meals throughout the day. Meals on wheels should help, but see the little green book and blue book for resources. If there are questions on resources - please reach out to Education officer, museum or nurse from [facility].   Recheck with me in 1 week for blood counts and blood pressure check.   Return to the clinic or go to the nearest emergency room if any of your symptoms worsen or new symptoms occur.   If you have lab work done today you will be contacted with your lab results within the next 2 weeks.  If you have not heard from [place] then please contact us. The fastest way to get your results is  to register for My Chart.   IF you received an x-ray today, you will receive an invoice from [facility]. Please contact [facility] at [phone] with questions or concerns regarding your invoice.   IF you received labwork today, you will receive an invoice from [place]. Please contact [facility] at [phone] with questions or concerns regarding your invoice.   Our billing staff will not be able to assist you with questions regarding bills from these companies.  You will be contacted with the lab results as soon as they are available. The fastest way to get your results is to activate your My Chart account. Instructions are located on the last page of this paperwork. If you have not heard  from [place] regarding the results in 2 weeks, please contact this office.       I personally performed the services described in this documentation, which was scribed in my presence. The recorded information has been reviewed and considered for accuracy and completeness, addended by me as needed, and agree with information above.  Signed,   [name], MD Primary Care at [place].  08/18/18 12:42 PM

## 2018-08-19 ENCOUNTER — Other Ambulatory Visit: Payer: Self-pay

## 2018-08-19 NOTE — Patient Outreach (Signed)
[place] [facility]) Care Management  08/19/2018  [name] [birth year] [id number]    72 year old with PMH: Recent observation admission due to symptomatic anemia requiring blood transfusion. PMH: Orthostatic hypotension; HTN; OSA; post op respiratory failure; liver tumor; hepatocellular cancer; DM; mild cognitive impairment with memory loss; Chronic confusional state; BPH; Pancytopenia; CPAP; Narcolepsy.  Member requested Life Alert due to c/o recent fall at home with broken rib and lack of caregiver support in the home. Attempted to call member at member preferred number today with plan to call members [facility] insurance with member on phone in order to assist with request for Life Alert; however, no answer and unable to leave HIPPA Compliant voicemail message.  Will follow up with member within 3-4 business days.   [name] "Edward" [name], RN-BSN  [facility] Care Management  Community Care Management Coordinator  [phone] [email or web address]

## 2018-08-21 ENCOUNTER — Ambulatory Visit: Payer: Self-pay | Admitting: Pharmacist

## 2018-08-22 ENCOUNTER — Inpatient Hospital Stay: Payer: Medicare Other | Attending: Oncology

## 2018-08-22 DIAGNOSIS — D500 Iron deficiency anemia secondary to blood loss (chronic): Secondary | ICD-10-CM

## 2018-08-22 DIAGNOSIS — C220 Liver cell carcinoma: Secondary | ICD-10-CM | POA: Diagnosis not present

## 2018-08-22 LAB — CBC WITH DIFFERENTIAL (CANCER CENTER ONLY)
Abs Immature Granulocytes: 0.01 10*3/uL (ref 0.00–0.07)
Basophils Absolute: 0 10*3/uL (ref 0.0–0.1)
Basophils Relative: 1 %
Eosinophils Absolute: 0 10*3/uL (ref 0.0–0.5)
Eosinophils Relative: 1 %
HCT: 26.2 % — ABNORMAL LOW (ref 39.0–52.0)
Hemoglobin: 7.9 g/dL — ABNORMAL LOW (ref 13.0–17.0)
Immature Granulocytes: 0 %
Lymphocytes Relative: 11 %
Lymphs Abs: 0.4 10*3/uL — ABNORMAL LOW (ref 0.7–4.0)
MCH: 30.2 pg (ref 26.0–34.0)
MCHC: 30.2 g/dL (ref 30.0–36.0)
MCV: 100 fL (ref 80.0–100.0)
Monocytes Absolute: 0.6 10*3/uL (ref 0.1–1.0)
Monocytes Relative: 16 %
Neutro Abs: 2.5 10*3/uL (ref 1.7–7.7)
Neutrophils Relative %: 71 %
Platelet Count: 113 10*3/uL — ABNORMAL LOW (ref 150–400)
RBC: 2.62 MIL/uL — ABNORMAL LOW (ref 4.22–5.81)
RDW: 16.2 % — ABNORMAL HIGH (ref 11.5–15.5)
WBC Count: 3.5 10*3/uL — ABNORMAL LOW (ref 4.0–10.5)
nRBC: 0 % (ref 0.0–0.2)

## 2018-08-25 ENCOUNTER — Ambulatory Visit: Payer: Medicare Other | Admitting: Family Medicine

## 2018-08-25 ENCOUNTER — Other Ambulatory Visit: Payer: Self-pay | Admitting: Pharmacist

## 2018-08-25 ENCOUNTER — Ambulatory Visit: Payer: Self-pay | Admitting: Pharmacist

## 2018-08-25 LAB — FERRITIN: Ferritin: 50 ng/mL (ref 24–336)

## 2018-08-25 NOTE — Patient Outreach (Signed)
[facility]) Care Management  [place]  08/25/2018  [name] October 23, 1945 [id number]   Reason for call: medication management/initiate compliance packaging  Unsuccessful telephone call attempt #1 to patient.   Unable to leave message VM box not set up  Plan:  I will make another outreach attempt to patient within 3-4 business days   Regina Eck, PharmD, [place]  [phone]

## 2018-08-26 ENCOUNTER — Telehealth: Payer: Self-pay

## 2018-08-26 ENCOUNTER — Other Ambulatory Visit: Payer: Self-pay

## 2018-08-26 ENCOUNTER — Ambulatory Visit: Payer: Self-pay

## 2018-08-26 NOTE — Telephone Encounter (Signed)
TC to patient per Lattie Haw to Make sure he is taking oral iron. Patient stated that he is still taking iron. Patient also told torepeat cbc with blood bank sample in 2 weeks, and to call for any symptoms of anemia. Pt verbalized understanding. No further problems or concerns at this time.

## 2018-08-26 NOTE — Patient Outreach (Signed)
[place] [facility]) Care Management  08/26/2018  MARTEL GALVAN 1945/11/16 [id number]   2nd Unsuccessful Outreach attempt to contact member. During initial home assessment, member requested Life Alert due to c/o recent fall at home with broken rib and lack of caregiver support in the home. Attempted to call member at member preferred number today with plan to call members [facility] insurance with member on phone in order to assist with request for Life Alert; however, no answer and unable to leave HIPPA Compliant voicemail message.  Will send Unsuccessful Outreach Letter to patient today. Will follow up with member within 3-4 business days.    [name], RN-BSN  [facility] Care Management  Community Care Management Coordinator  [phone] [email or web address]

## 2018-08-26 NOTE — Telephone Encounter (Signed)
TC to patient. Unable to leave voicemail. Will try again later

## 2018-08-27 ENCOUNTER — Telehealth: Payer: Self-pay | Admitting: Family Medicine

## 2018-08-27 NOTE — Telephone Encounter (Signed)
Called patient as No show last appt and unable to reach notes reviewed.  Talked to patient and he unintentionally missed last appt. Will have schedulers call him to reschedule. Also advised that [facility] CM may also be calling back as well. Understanding expressed.

## 2018-08-28 ENCOUNTER — Ambulatory Visit: Payer: Self-pay | Admitting: Pharmacist

## 2018-08-29 ENCOUNTER — Telehealth: Payer: Self-pay | Admitting: Family Medicine

## 2018-08-29 ENCOUNTER — Other Ambulatory Visit: Payer: Self-pay | Admitting: Pharmacist

## 2018-08-29 NOTE — Telephone Encounter (Signed)
Per Dr. Carlota Raspberry, called pt and set up pt with Dr. Carlota Raspberry for Monday 09/01/18 at 11:20. I advised pt of time, building and late policy. Pt acknowledged.

## 2018-08-29 NOTE — Patient Outreach (Signed)
[place] [facility]) Care Management  [place]  08/29/2018  REIS GOGA 1946-06-21 [id number]   Reason for call: medication management  Unsuccessful telephone call attempt #2 to patient.   Unable to leave message  Plan:  I will make another outreach attempt to patient within 3-4 business days   Regina Eck, PharmD, [place]  [phone]

## 2018-09-01 ENCOUNTER — Encounter: Payer: Self-pay | Admitting: Family Medicine

## 2018-09-01 ENCOUNTER — Telehealth: Payer: Self-pay | Admitting: Family Medicine

## 2018-09-01 ENCOUNTER — Ambulatory Visit (INDEPENDENT_AMBULATORY_CARE_PROVIDER_SITE_OTHER): Payer: Medicare Other | Admitting: Family Medicine

## 2018-09-01 ENCOUNTER — Other Ambulatory Visit: Payer: Self-pay

## 2018-09-01 ENCOUNTER — Other Ambulatory Visit: Payer: Self-pay | Admitting: Pharmacist

## 2018-09-01 ENCOUNTER — Ambulatory Visit: Payer: Self-pay

## 2018-09-01 ENCOUNTER — Ambulatory Visit: Payer: Self-pay | Admitting: Pharmacist

## 2018-09-01 VITALS — BP 115/61 | HR 90 | Temp 97.5°F | Resp 16 | Ht 69.0 in | Wt 169.6 lb

## 2018-09-01 DIAGNOSIS — D649 Anemia, unspecified: Secondary | ICD-10-CM

## 2018-09-01 DIAGNOSIS — D61818 Other pancytopenia: Secondary | ICD-10-CM | POA: Diagnosis not present

## 2018-09-01 LAB — POCT CBC
Granulocyte percent: 77.5 %G (ref 37–80)
HCT, POC: 23.2 % — AB (ref 29–41)
Hemoglobin: 7.5 g/dL — AB (ref 11–14.6)
Lymph, poc: 0.6 (ref 0.6–3.4)
MCH, POC: 29.1 pg (ref 27–31.2)
MCHC: 32.5 g/dL (ref 31.8–35.4)
MCV: 89.5 fL (ref 76–111)
MID (cbc): 0.3 (ref 0–0.9)
MPV: 6.7 fL (ref 0–99.8)
POC Granulocyte: 2.9 (ref 2–6.9)
POC LYMPH PERCENT: 15.3 %L (ref 10–50)
POC MID %: 7.2 %M (ref 0–12)
Platelet Count, POC: 90 10*3/uL — AB (ref 142–424)
RBC: 2.59 M/uL — AB (ref 4.69–6.13)
RDW, POC: 17.9 %
WBC: 3.8 10*3/uL — AB (ref 4.6–10.2)

## 2018-09-01 LAB — IFOBT (OCCULT BLOOD): IFOBT: NEGATIVE

## 2018-09-01 NOTE — Telephone Encounter (Signed)
Copied from [place]. Topic: Quick Communication - See Telephone Encounter >> Sep 01, 2018  9:24 AM Vernona Rieger wrote: CRM for notification. See Telephone encounter for: 09/01/18.  Thressa Sheller, RN with [facility] called and advised she has tried to reach this patient on 3 different encounters regarding him wanting the " life alert ". She said she sees that he has an appt with Dr Carlota Raspberry today at 11:20 and would like Dr Carlota Raspberry or his nurse to let him know that she has tried to contact him and can not get him on the number in his chart. She asked to please have him call her at [phone]

## 2018-09-01 NOTE — Patient Outreach (Signed)
[facility]) Care Management  [place]  09/01/2018  [name] 07-17-1946 [id number]   Reason for call: medication managment  Unsuccessful telephone call attempt #3 to patient.   Unable to leave message. VM box not set up  Plan:  I will close this case as I have not been able to maintain contact with this patient I will route letter and inform other [facility] disciplines of case closure.  Regina Eck, PharmD, [place]  [phone]

## 2018-09-01 NOTE — Patient Outreach (Signed)
[place] [facility]) Care Management  09/01/2018  [name] 11-Sep-1945 [id number]  3rd Unsuccessful Outreach attempt to contact member. During initial home assessment, member requestedLife Alert due to c/o recent fall at home with broken rib and lack of caregiver support in the home.Attempted to call memberat member preferred number todaywith plan to call members [facility] insurance with member on phone in order to assist with request for Life Alert; however, no answer and unable to leave HIPPA Compliant voicemail message.  Called member's PCP [name] [phone]) and spoke to [place], as member has PCP office visit scheduled for today at 1120. [place] made aware of attempts to contact member and requested for PCP to make member aware. Provided [place] with this RNCM [facility] contact number. [place] stated that a message has been sent to PCP.  Will await for return call from member. Per Workflow, will place member on hold on schedule for 10 business days.   Edward Mola "ANN" [name], RN-BSN  [facility] [facility] Care Management  Community Care Management Coordinator  [phone] [email or web address]

## 2018-09-01 NOTE — Patient Instructions (Addendum)
  Hemoglobin is lower than when tested 10 days ago as well as last office visit, but blood pressures are stable, no blood noted in the stool, and based on your lack of symptoms we can monitor this closely as an outpatient at this time.  Please return for lab only visit in 2 days for recheck of blood count.  If at any point in time you notice blood in your stool, dark tar colored stools, lightheadedness, dizziness, chest pain, or shortness of breath, proceed to the emergency room immediately.  Please call Ms. Chambers regarding setting up [employer]. She has been unable to reach you.  [name], [place] Management Coordinator  [phone]  Return to the clinic or go to the nearest emergency room if any of your symptoms worsen or new symptoms occur.   If you have lab work done today you will be contacted with your lab results within the next 2 weeks.  If you have not heard from [place] then please contact us. The fastest way to get your results is to register for My Chart.   IF you received an x-ray today, you will receive an invoice from [facility]. Please contact [facility] at [phone] with questions or concerns regarding your invoice.   IF you received labwork today, you will receive an invoice from [place]. Please contact [facility] at [phone] with questions or concerns regarding your invoice.   Our billing staff will not be able to assist you with questions regarding bills from these companies.  You will be contacted with the lab results as soon as they are available. The fastest way to get your results is to activate your My Chart account. Instructions are located on the last page of this paperwork. If you have not heard from [place] regarding the results in 2 weeks, please contact this office.

## 2018-09-01 NOTE — Telephone Encounter (Signed)
Spoke with pt and advise him to give the office a call to get his life alert set up, he verbalized understanding.

## 2018-09-01 NOTE — Progress Notes (Signed)
Subjective:    Patient ID: [name], male    DOB: 1946-03-03, 73 y.o.   MRN: [id number]  HPI [name] is a 73 y.o. male Presents today for: Chief Complaint  Patient presents with  . Follow-up     repeat cbc   Here for follow-up.  Prior symptomatic anemia in December, pancytopenia history.  Transfuse with 1 unit of PRBC in hospital with improvement of hemoglobin from 6.7-7.8 in December.  Aldactone was also decreased to once per day.  Previously decreased his metformin, which was ultimately stopped as A1c was 4.8 on December 11, and stopped losartan.  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  No apparent findings to indicate cause for acute anemia.  Fecal occult blood testing was negative.  When I saw him on December 30 he was feeling better.  He was still working on his strength, had physical therapy at home.  Also had care management working with him for nutrition options including plan for Meals on Wheels, and booklets given for local resources to help with food.  He was also drinking Ensure to help with nutrition at last visit.   Hemoglobin improved to 8.4 on December 30, platelets 131, WBC 3.5.  Plan for recheck hemoglobin in 1 week. He is followed by hematology/oncology at [facility].  Had a ferritin of 50 on January 3.  Hemoglobin of 7.9 on January 3.  Telephone note reviewed from January 7 to verify he was taking iron.  Plan for repeat blood sample in 2 weeks.   Today states occasional small amounts of blood in nasal d/c but no true epistaxis. No blood in stool or dark/tar colored stools. No abd pain. No chest pain or shortness of breath.   Denies lightheadedness or dizziness with standing.  Denies new fatigue. Working with PT. Eating more than prior and  taking ensure.  Taking iron 3 per day, and vitamin 1 per day.  Feels better today than last OV.    Wt Readings from Last 3 Encounters:  09/01/18 169 lb 9.6 oz (76.9 kg)  08/18/18 169 lb 9.6 oz (76.9 kg)  08/06/18 164 lb (74.4 kg)      Patient Active Problem List   Diagnosis Date Noted  . Orthostatic hypotension 08/06/2018  . Hypertension 08/06/2018  . Hyperlipidemia 08/06/2018  . Pancytopenia ([place]) 08/06/2018  . 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 Insufficient treatment with nasal CPAP 05/05/2018  . 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 Irregular heart beat 01/28/2018  . Noncompliance with CPAP treatment 08/14/2017  . Excessive daytime sleepiness 04/09/2017  . Snoring 04/09/2017  . Memory impairment of gradual onset 04/09/2017  . Aortic atherosclerosis ([place]) 11/27/2016  . 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 Altered mental status 03/05/2013  . Respiratory failure, post-operative ([place]) 03/02/2013  . Hepatitis B 02/24/2013  . Cancer, hepatocellular ([place]) 02/10/2013  . Liver tumor-bleeding 01/15/2013  . Epidermal cyst 06/18/2012  . OSA (obstructive sleep apnea) 03/13/2012  . Abnormal leg movement 12/12/2011  . Blood pressure elevated 09/06/2011  . Diabetes mellitus ([place]) 09/06/2011  . 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 BPH (benign prostatic hyperplasia) 09/06/2011  . Diverticula of colon 09/06/2011   Past Medical History:  Diagnosis Date  . Cancer [facility])    hepatocellular cancer   .  Diabetes mellitus without complication ([place])   . Dyspnea   . Hepatitis B   . Hyperlipidemia   . Hypertension   . Irregular heart beat 01/28/2018  . Narcolepsy    per office visit note of 08/2011   . Sleep apnea    cpap   Past Surgical History:  Procedure Laterality Date  . CHOLECYSTECTOMY N/A 01/28/2018   Procedure: LAPAROSCOPIC CHOLECYSTECTOMY;  Surgeon: [name], MD;  Location: [place];   Service: General;  Laterality: N/A;  . COLONOSCOPY WITH PROPOFOL N/A 08/27/2017   Procedure: COLONOSCOPY WITH PROPOFOL;  Surgeon: Clarene Essex, MD;  Location: [facility];  Service: Endoscopy;  Laterality: N/A;  . HOT HEMOSTASIS N/A 08/27/2017   Procedure: HOT HEMOSTASIS (ARGON PLASMA COAGULATION/BICAP);  Surgeon: Clarene Essex, MD;  Location: [facility];  Service: Endoscopy;  Laterality: N/A;  . LAPAROSCOPIC CHOLECYSTECTOMY  01/28/2018  . LAPAROSCOPY  03/02/2013   Procedure: LAPAROSCOPY DIAGNOSTIC;  Surgeon: [name], MD;  Location: [facility];  Service: General;;  . LIVER ULTRASOUND  03/02/2013   Procedure: LIVER ULTRASOUND;  Surgeon: [name], MD;  Location: [facility];  Service: General;;  . OPEN PARTIAL HEPATECTOMY   03/02/2013   Procedure: OPEN PARTIAL HEPATECTOMY [83];  Surgeon: [name], MD;  Location: [facility];  Service: General;;  DX LAPAROSCOPY, INTRAOPERATIVE LIVER ULTRASOUND, OPEN PARTIAL HEPATECTOMY  . pinched nerve in back     Allergies  Allergen Reactions  . Ace Inhibitors Swelling and Other (See Comments)    Angioedema - face.    Prior to Admission medications   Medication Sig Start Date End Date Taking? Authorizing Provider  ACCU-CHEK SOFTCLIX LANCETS lancets Once per day testing. 08/05/17  Yes Wendie Agreste, MD  alfuzosin (UROXATRAL) 10 MG 24 hr tablet Take 1 tablet (10 mg total) by mouth at bedtime. 06/11/18  Yes Wendie Agreste, MD  amphetamine-dextroamphetamine (ADDERALL XR) 30 MG 24 hr capsule Take daily by mouth with water 30 minutes before breakfast. 05/05/18  Yes Dohmeier, Asencion Partridge, MD  blood glucose meter kit and supplies Dispense based on patient and insurance preference. Use up to four times daily as directed. (FOR ICD-9 250.00, 250.01). 04/25/17  Yes Wendie Agreste, MD  donepezil (ARICEPT) 10 MG tablet Take 1 tablet (10 mg total) by mouth at bedtime. 05/05/18  Yes Dohmeier, Asencion Partridge, MD  feeding supplement, ENSURE ENLIVE, (ENSURE ENLIVE) LIQD Take 237 mLs by mouth  2 (two) times daily between meals. 08/08/18  Yes Emokpae, Courage, MD  ferrous sulfate (KP FERROUS SULFATE) 325 (65 FE) MG tablet Take 1 tablet (325 mg total) by mouth 3 (three) times daily with meals. 08/08/18  Yes Emokpae, Courage, MD  glucose blood (ACCU-CHEK AVIVA PLUS) test strip 1 each by Other route as needed for other. Use as instructed - once per day testing for now. 07/30/18  Yes Wendie Agreste, MD  Multiple Vitamin (MULTIVITAMIN WITH MINERALS) TABS tablet Take 1 tablet by mouth daily. 08/09/18  Yes Emokpae, Courage, MD  pravastatin (PRAVACHOL) 20 MG tablet Take 1 tablet (20 mg total) by mouth at bedtime. 06/11/18  Yes Wendie Agreste, MD  sertraline (ZOLOFT) 25 MG tablet Take 1 tablet (25 mg total) by mouth daily. 07/30/18  Yes Wendie Agreste, MD  spironolactone (ALDACTONE) 25 MG tablet Take 1 tablet (25 mg total) by mouth daily. 08/08/18  Yes Roxan Hockey, MD   Social History   Socioeconomic History  . Marital status: Divorced    Spouse name: Not on file  . Number of children:  2  . Years of education: 15+  . Highest education level: Bachelor's degree (e.g., BA, AB, BS)  Occupational History  . Not on file  Social Needs  . Financial resource strain: Very hard  . Food insecurity:    Worry: Sometimes true    Inability: Sometimes true  . Transportation needs:    Medical: No    Non-medical: No  Tobacco Use  . Smoking status: Former Smoker    Years: 5.00    Types: Cigarettes  . Smokeless tobacco: Never Used  Substance and Sexual Activity  . Alcohol use: No    Frequency: Never  . Drug use: No  . Sexual activity: Not Currently  Lifestyle  . Physical activity:    Days per week: 0 days    Minutes per session: 0 min  . Stress: Not at all  Relationships  . Social connections:    Talks on phone: Once a week    Gets together: Once a week    Attends religious service: 1 to 4 times per year    Active member of club or organization: No    Attends meetings of  clubs or organizations: Never    Relationship status: Divorced  . Intimate partner violence:    Fear of current or ex partner: Not on file    Emotionally abused: Not on file    Physically abused: Not on file    Forced sexual activity: Not on file  Other Topics Concern  . Not on file  Social History Narrative   Patient is single and lives alone- became widower when 1 child was 16 year old, divorced 2nd marriage   Has #2 grown children, not in area or involved in his life   Patient is retired.   Patient has a college education.   Patient is right-handed.   Patient drinks maybe two sodas daily.    Review of Systems Per HPI.     Objective:   Physical Exam Vitals:   09/01/18 1051  BP: 115/61  Pulse: 90  Resp: 16  Temp: (!) 97.5 F (36.4 C)  TempSrc: Oral  SpO2: 100%  Weight: 169 lb 9.6 oz (76.9 kg)  Height: 5' 9" (1.753 m)      Results for orders placed or performed in visit on 09/01/18  POCT CBC  Result Value Ref Range   WBC 3.8 (A) 4.6 - 10.2 K/uL   Lymph, poc 0.6 0.6 - 3.4   POC LYMPH PERCENT 15.3 10 - 50 %L   MID (cbc) 0.3 0 - 0.9   POC MID % 7.2 0 - 12 %M   POC Granulocyte 2.9 2 - 6.9   Granulocyte percent 77.5 37 - 80 %G   RBC 2.59 (A) 4.69 - 6.13 M/uL   Hemoglobin 7.5 (A) 11 - 14.6 g/dL   HCT, POC 23.2 (A) 29 - 41 %   MCV 89.5 76 - 111 fL   MCH, POC 29.1 27 - 31.2 pg   MCHC 32.5 31.8 - 35.4 g/dL   RDW, POC 17.9 %   Platelet Count, POC 90 (A) 142 - 424 K/uL   MPV 6.7 0 - 99.8 fL  IFOBT POC (occult bld, rslt in office)  Result Value Ref Range   IFOBT Negative    Orthostatic VS for the past 24 hrs (Last 3 readings):  BP- Lying Pulse- Lying BP- Standing at 0 minutes Pulse- Standing at 0 minutes BP- Standing at 3 minutes Pulse- Standing at 3 minutes  09/01/18 1238  119/71 75 105/65 79 116/71 87       Assessment & Plan:  [name] is a 73 y.o. male Other pancytopenia ([place]) - Plan: POCT CBC, CBC with Differential/Platelet  Anemia, unspecified  type - Plan: IFOBT POC (occult bld, rslt in office), Orthostatic vital signs, IFOBT POC (occult bld, rslt in office), CBC with Differential/Platelet  Chronic anemia with slight decrease hemoglobin from recent testing.  Does note increase diet, which may be hydration effect, but he is also asymptomatic in the office today.  Denies any lightheadedness/dizziness, chest pain, dyspnea, blood in stool or abdominal pain.  Orthostatics appeared okay, and heme negative stool testing. Reports compliance with iron supplement.   -Placed lab only order for him to recheck CBC in 48 hours, but at any point he feels increased fatigue, lightheadedness, dizziness, symptomatic advised to proceed to the emergency room.  Understanding expressed.  No orders of the defined types were placed in this encounter.  Patient Instructions    Hemoglobin is lower than when tested 10 days ago as well as last office visit, but blood pressures are stable, no blood noted in the stool, and based on your lack of symptoms we can monitor this closely as an outpatient at this time.  Please return for lab only visit in 2 days for recheck of blood count.  If at any point in time you notice blood in your stool, dark tar colored stools, lightheadedness, dizziness, chest pain, or shortness of breath, proceed to the emergency room immediately.  Please call Ms. Chambers regarding setting up [employer]. She has been unable to reach you.  Elizabeth "ANN" [name], [place] Management Coordinator  [phone]  Return to the clinic or go to the nearest emergency room if any of your symptoms worsen or new symptoms occur.   If you have lab work done today you will be contacted with your lab results within the next 2 weeks.  If you have not heard from [place] then please contact us. The fastest way to get your results is to register for My Chart.   IF you received an x-ray today, you will receive an invoice from  [facility]. Please contact [facility] at [phone] with questions or concerns regarding your invoice.   IF you received labwork today, you will receive an invoice from [facility]. Please contact [facility] at [phone] with questions or concerns regarding your invoice.   Our billing staff will not be able to assist you with questions regarding bills from these companies.  You will be contacted with the lab results as soon as they are available. The fastest way to get your results is to activate your My Chart account. Instructions are located on the last page of this paperwork. If you have not heard from [place] regarding the results in 2 weeks, please contact this office.      Signed,   [name], MD [facility] [place].  09/01/18 12:53 PM

## 2018-09-03 ENCOUNTER — Ambulatory Visit (INDEPENDENT_AMBULATORY_CARE_PROVIDER_SITE_OTHER): Payer: Medicare Other | Admitting: Family Medicine

## 2018-09-03 DIAGNOSIS — D649 Anemia, unspecified: Secondary | ICD-10-CM

## 2018-09-03 DIAGNOSIS — D61818 Other pancytopenia: Secondary | ICD-10-CM

## 2018-09-03 LAB — CBC WITH DIFFERENTIAL/PLATELET
Basophils Absolute: 0 10*3/uL (ref 0.0–0.2)
Basos: 0 %
EOS (ABSOLUTE): 0.1 10*3/uL (ref 0.0–0.4)
Eos: 2 %
Hematocrit: 24.4 % — ABNORMAL LOW (ref 37.5–51.0)
Hemoglobin: 7.6 g/dL — ABNORMAL LOW (ref 13.0–17.7)
Immature Grans (Abs): 0 10*3/uL (ref 0.0–0.1)
Immature Granulocytes: 0 %
Lymphocytes Absolute: 0.6 10*3/uL — ABNORMAL LOW (ref 0.7–3.1)
Lymphs: 23 %
MCH: 29.2 pg (ref 26.6–33.0)
MCHC: 31.1 g/dL — ABNORMAL LOW (ref 31.5–35.7)
MCV: 94 fL (ref 79–97)
Monocytes Absolute: 0.2 10*3/uL (ref 0.1–0.9)
Monocytes: 8 %
Neutrophils Absolute: 1.8 10*3/uL (ref 1.4–7.0)
Neutrophils: 67 %
Platelets: 118 10*3/uL — ABNORMAL LOW (ref 150–450)
RBC: 2.6 x10E6/uL — CL (ref 4.14–5.80)
RDW: 15.7 % — ABNORMAL HIGH (ref 11.6–15.4)
WBC: 2.7 10*3/uL — ABNORMAL LOW (ref 3.4–10.8)

## 2018-09-03 NOTE — Progress Notes (Signed)
Lab only visit -pt not seen. HGB 7.5 3 days ago.   Lab Results  Component Value Date   WBC 3.8 (A) 09/01/2018   HGB 7.5 (A) 09/01/2018   HCT 23.2 (A) 09/01/2018   MCV 89.5 09/01/2018   PLT 113 (L) 08/22/2018

## 2018-09-08 ENCOUNTER — Other Ambulatory Visit: Payer: Self-pay

## 2018-09-08 NOTE — Patient Outreach (Addendum)
[name]) Care Management  09/08/2018  [name] 08-28-1945 [id number]   73 year old with PMH: Recent observation admission due tosymptomaticanemia requiring blood transfusion. PMH: Orthostatic hypotension; HTN; OSA; post op respiratory failure; liver tumor; hepatocellular cancer; DM; mild cognitive impairment with memory loss; Chronic confusional state; BPH; Pancytopenia; CPAP; Narcolepsy.  Member requested Life Alert due to c/o recent fall at home with broken rib and lack of caregiver support in the home.  Member returned call today and HIPPA identifiers verified. Member continues to request assistance with obtaining Life Alert via [facility] insurance. Member provided [facility] with number [phone] for [facility]. Called number with member on phone (3 way call) and [name] answered phone.   [name] made aware of member's request for Life Alert; however, [name] stated that Personal Emergency Response [facility]) is not a covered benefit for member.   Falls: member denies any recent falls  [place]: Member stated he had labs drawn; however, he is unable to remember education provided via PCP concerning HGB level. Per MD notes, member's HGB slightly decreased; however, member asymptomatic. Member encouraged to call PCP if he desires more information concerning lab values and member verbalized understanding and agreement. Member denies any obvious blood in stool; denies black tarry stool and states stool color "brown"; denies feelings of weakness, SOB, rapid heart rate. Educated member on s/s of Anemia. Member encouraged to call 911 if above signs/symptoms noted.   Medication: member verified medication and stated that he has been compliant with taking medications as ordered.   Appointments: member completed f/u visit with PCP on 09-01-2018. Upcoming appointments discussed.  Member denies any other concerns at this time.  Will follow up with member next  month.  THN CM Care Plan Problem One     Most Recent Value  Care Plan Problem One  Risk for readmission r/t Knowledge deficit regarding causative factors with disease: Anemia  Role Documenting the Problem One  Care Management Coordinator  Care Plan for Problem One  Active  THN Long Term Goal   Member will describe known facts about Anemia and treatment plan within next 45 days  THN Long Term Goal Start Date  08/14/18  [facility] CM Short Term Goal #1   Member will discuss HGB levels with PCP at next office visit.  THN CM Short Term Goal #1 Start Date  08/14/18  Interventions for Short Term Goal #1  Assessed member's knowledge of current hemoglobin levels,  Empowered member to discuss hemoglobin levels with PCP  THN CM Short Term Goal #2   Member will not experience s/s of anemia within next 30 days  THN CM Short Term Goal #2 Start Date  08/14/18  Interventions for Short Term Goal #2  Evaluated member's rememberance of previous education of s/s of anemia      [name] "ANN" [employer], [place] Management Coordinator  [phone] [name]

## 2018-09-12 NOTE — Progress Notes (Signed)
Called pt and informed him of providers message. He stated that he was doing well today.

## 2018-09-17 ENCOUNTER — Other Ambulatory Visit: Payer: Self-pay

## 2018-09-18 ENCOUNTER — Encounter (HOSPITAL_COMMUNITY): Payer: Self-pay

## 2018-09-18 ENCOUNTER — Emergency Department (HOSPITAL_COMMUNITY)
Admission: EM | Admit: 2018-09-18 | Discharge: 2018-09-18 | Disposition: A | Discharge status: Home / Self Care | Payer: Medicare Other | Attending: Emergency Medicine | Admitting: Emergency Medicine

## 2018-09-18 ENCOUNTER — Emergency Department (HOSPITAL_COMMUNITY): Payer: Medicare Other

## 2018-09-18 ENCOUNTER — Other Ambulatory Visit: Payer: Self-pay

## 2018-09-18 DIAGNOSIS — Z87891 Personal history of nicotine dependence: Secondary | ICD-10-CM | POA: Diagnosis not present

## 2018-09-18 DIAGNOSIS — Z79899 Other long term (current) drug therapy: Secondary | ICD-10-CM | POA: Diagnosis not present

## 2018-09-18 DIAGNOSIS — E119 Type 2 diabetes mellitus without complications: Secondary | ICD-10-CM | POA: Diagnosis not present

## 2018-09-18 DIAGNOSIS — K5730 Diverticulosis of large intestine without perforation or abscess without bleeding: Secondary | ICD-10-CM | POA: Diagnosis not present

## 2018-09-18 DIAGNOSIS — I10 Essential (primary) hypertension: Secondary | ICD-10-CM | POA: Diagnosis not present

## 2018-09-18 DIAGNOSIS — K7460 Unspecified cirrhosis of liver: Secondary | ICD-10-CM | POA: Diagnosis not present

## 2018-09-18 DIAGNOSIS — R1030 Lower abdominal pain, unspecified: Secondary | ICD-10-CM | POA: Diagnosis not present

## 2018-09-18 DIAGNOSIS — K5900 Constipation, unspecified: Secondary | ICD-10-CM | POA: Diagnosis not present

## 2018-09-18 LAB — URINALYSIS, ROUTINE W REFLEX MICROSCOPIC
Bilirubin Urine: NEGATIVE
Glucose, UA: NEGATIVE mg/dL
Hgb urine dipstick: NEGATIVE
Ketones, ur: NEGATIVE mg/dL
Leukocytes, UA: NEGATIVE
Nitrite: NEGATIVE
Protein, ur: NEGATIVE mg/dL
Specific Gravity, Urine: 1.026 (ref 1.005–1.030)
pH: 5 (ref 5.0–8.0)

## 2018-09-18 LAB — COMPREHENSIVE METABOLIC PANEL WITH GFR
ALT: 35 U/L (ref 0–44)
AST: 39 U/L (ref 15–41)
Albumin: 3.1 g/dL — ABNORMAL LOW (ref 3.5–5.0)
Alkaline Phosphatase: 167 U/L — ABNORMAL HIGH (ref 38–126)
Anion gap: 8 (ref 5–15)
BUN: 10 mg/dL (ref 8–23)
CO2: 21 mmol/L — ABNORMAL LOW (ref 22–32)
Calcium: 8.5 mg/dL — ABNORMAL LOW (ref 8.9–10.3)
Chloride: 108 mmol/L (ref 98–111)
Creatinine, Ser: 1.17 mg/dL (ref 0.61–1.24)
GFR calc Af Amer: >60 mL/min
GFR calc non Af Amer: >60 mL/min
Glucose, Bld: 122 mg/dL — ABNORMAL HIGH (ref 70–99)
Potassium: 3.3 mmol/L — ABNORMAL LOW (ref 3.5–5.1)
Sodium: 137 mmol/L (ref 135–145)
Total Bilirubin: 1.4 mg/dL — ABNORMAL HIGH (ref 0.3–1.2)
Total Protein: 6.8 g/dL (ref 6.5–8.1)

## 2018-09-18 LAB — CBC
HCT: 30.2 % — ABNORMAL LOW (ref 39.0–52.0)
Hemoglobin: 9 g/dL — ABNORMAL LOW (ref 13.0–17.0)
MCH: 29.9 pg (ref 26.0–34.0)
MCHC: 29.8 g/dL — ABNORMAL LOW (ref 30.0–36.0)
MCV: 100.3 fL — ABNORMAL HIGH (ref 80.0–100.0)
Platelets: 109 10*3/uL — ABNORMAL LOW (ref 150–400)
RBC: 3.01 MIL/uL — ABNORMAL LOW (ref 4.22–5.81)
RDW: 16.5 % — ABNORMAL HIGH (ref 11.5–15.5)
WBC: 5 10*3/uL (ref 4.0–10.5)
nRBC: 0 % (ref 0.0–0.2)

## 2018-09-18 LAB — LIPASE, BLOOD: Lipase: 32 U/L (ref 11–51)

## 2018-09-18 MED ORDER — IOPAMIDOL (ISOVUE-300) INJECTION 61%
100.0000 mL | Freq: Once | INTRAVENOUS | Status: AC | PRN
  Administered 2018-09-18: 100 mL via INTRAVENOUS

## 2018-09-18 MED ORDER — SODIUM CHLORIDE 0.9% FLUSH: 3.0000 mL | Freq: Once | INTRAVENOUS | Status: DC

## 2018-09-18 MED ORDER — SODIUM CHLORIDE 0.9 % IV BOLUS
500.0000 mL | Freq: Once | INTRAVENOUS | Status: AC
  Administered 2018-09-18: 500 mL via INTRAVENOUS

## 2018-09-18 MED ORDER — IOPAMIDOL (ISOVUE-300) INJECTION 61%
INTRAVENOUS | Status: AC
  Filled 2018-09-18: qty 100

## 2018-09-18 MED ORDER — SODIUM CHLORIDE (PF) 0.9 % IJ SOLN
INTRAMUSCULAR | Status: AC
  Filled 2018-09-18: qty 50

## 2018-09-18 NOTE — ED Triage Notes (Signed)
patient reports that he has not had a normal BM in 5 days. Patient c/o intermittent mid abdominal pain. Patient states he has not taken any meds for constipation. Patient denies any n/V.

## 2018-09-18 NOTE — Discharge Instructions (Addendum)
° °  Abdominal Pain, Adult Abdominal pain can be caused by many things. Often, abdominal pain is not serious and it gets better with no treatment or by being treated at home. However, sometimes abdominal pain is serious. Your health care provider will do a medical history and a physical exam to try to determine the cause of your abdominal pain. Follow these instructions at home:  Take over-the-counter and prescription medicines only as told by your health care provider. Do not take a laxative unless told by your health care provider.  Drink enough fluid to keep your urine clear or pale yellow.  Watch your condition for any changes.  Keep all follow-up visits as told by your health care provider. This is important. Contact a health care provider if:  Your abdominal pain changes or gets worse.  You are not hungry or you lose weight without trying.  You are constipated or have diarrhea for more than 2-3 days.  You have pain when you urinate or have a bowel movement.  Your abdominal pain wakes you up at night.  Your pain gets worse with meals, after eating, or with certain foods.  You are throwing up and cannot keep anything down.  You have a fever. Get help right away if:  Your pain does not go away as soon as your health care provider told you to expect.  You cannot stop throwing up.  Your pain is only in areas of the abdomen, such as the right side or the left lower portion of the abdomen.  You have bloody or black stools, or stools that look like tar.  You have severe pain, cramping, or bloating in your abdomen.  You have signs of dehydration, such as: ? Dark urine, very little urine, or no urine. ? Cracked lips. ? Dry mouth. ? Sunken eyes. ? Sleepiness. ? Weakness. This information is not intended to replace advice given to you by your health care provider. Make sure you discuss any questions you have with your health care provider. Document Released: 05/16/2005  Document Revised: 02/24/2016 Document Reviewed: 01/18/2016 Elsevier Interactive Patient Education  2019 [place] can take miralax, available over the counter for constipation.

## 2018-09-18 NOTE — ED Provider Notes (Signed)
[facility] DEPT Provider Note   CSN: [id number] Arrival date & time: 09/18/18  1148     History   Chief Complaint Chief Complaint  Patient presents with  . Constipation  . Abdominal Pain    HPI [name] is a 73 y.o. male.  The history is provided by the patient and medical records. No language interpreter was used.  Constipation  Associated symptoms: abdominal pain   Abdominal Pain  Associated symptoms: constipation    [name] is a 73 y.o. male who presents to the Emergency Department complaining of abdominal pain. He presents to the emergency department from home for evaluation of abdominal pain. He reports four days of constipation with progressive lower abdominal pain. Pain is waxing and waning incomes and sharp episodes. He denies any fevers, nausea. He does have difficulty with urination. He is able to pass flatus. He saw his doctor one week ago and was told to take a medication for constipation. He states that he took this medication, but is unsure what it is with no change in his symptoms. Symptoms are moderate, waxing and waning, worsening. Past Medical History:  Diagnosis Date  . Cancer [facility])    hepatocellular cancer   . Diabetes mellitus without complication ([place])   . Dyspnea   . Hepatitis B   . Hyperlipidemia   . Hypertension   . Irregular heart beat 01/28/2018  . Narcolepsy    per office visit note of 08/2011   . Sleep apnea    cpap    Patient Active Problem List   Diagnosis Date Noted  . Orthostatic hypotension 08/06/2018  . Hypertension 08/06/2018  . Hyperlipidemia 08/06/2018  . Pancytopenia ([place]) 08/06/2018  . 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 Insufficient treatment with nasal CPAP 05/05/2018  . 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 Irregular heart beat 01/28/2018  . Noncompliance with CPAP treatment 08/14/2017  . Excessive  daytime sleepiness 04/09/2017  . Snoring 04/09/2017  . Memory impairment of gradual onset 04/09/2017  . Aortic atherosclerosis ([place]) 11/27/2016  . 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 Altered mental status 03/05/2013  . Respiratory failure, post-operative ([place]) 03/02/2013  . Hepatitis B 02/24/2013  . Cancer, hepatocellular ([place]) 02/10/2013  . Liver tumor-bleeding 01/15/2013  . Epidermal cyst 06/18/2012  . OSA (obstructive sleep apnea) 03/13/2012  . Abnormal leg movement 12/12/2011  . Blood pressure elevated 09/06/2011  . Diabetes mellitus ([place]) 09/06/2011  . 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 BPH (benign prostatic hyperplasia) 09/06/2011  . Diverticula of colon 09/06/2011    Past Surgical History:  Procedure Laterality Date  . CHOLECYSTECTOMY N/A 01/28/2018   Procedure: LAPAROSCOPIC CHOLECYSTECTOMY;  Surgeon: [name], MD;  Location: [facility];  Service: General;  Laterality: N/A;  . COLONOSCOPY WITH PROPOFOL N/A 08/27/2017   Procedure: COLONOSCOPY WITH PROPOFOL;  Surgeon: Clarene Essex, MD;  Location: [facility];  Service: Endoscopy;  Laterality: N/A;  . HOT HEMOSTASIS N/A 08/27/2017   Procedure: HOT HEMOSTASIS (ARGON PLASMA COAGULATION/BICAP);  Surgeon: Clarene Essex, MD;  Location: [facility];  Service: Endoscopy;  Laterality: N/A;  . LAPAROSCOPIC CHOLECYSTECTOMY  01/28/2018  . LAPAROSCOPY  03/02/2013   Procedure: LAPAROSCOPY DIAGNOSTIC;  Surgeon: [name], MD;  Location: [facility];  Service: General;;  . LIVER ULTRASOUND  03/02/2013   Procedure: LIVER ULTRASOUND;  Surgeon: [name], MD;  [facility];  Service: General;;  . OPEN PARTIAL HEPATECTOMY   03/02/2013  Procedure: OPEN PARTIAL HEPATECTOMY [83];  Surgeon: [name], MD;  Location: [facility];  Service: General;;  DX LAPAROSCOPY, INTRAOPERATIVE LIVER ULTRASOUND, OPEN PARTIAL HEPATECTOMY  . pinched nerve in back          Home Medications    Prior to Admission medications   Medication Sig  Start Date End Date Taking? Authorizing Provider  alfuzosin (UROXATRAL) 10 MG 24 hr tablet Take 1 tablet (10 mg total) by mouth at bedtime. 06/11/18  Yes Wendie Agreste, MD  amphetamine-dextroamphetamine (ADDERALL XR) 30 MG 24 hr capsule Take daily by mouth with water 30 minutes before breakfast. 05/05/18  Yes Dohmeier, Asencion Partridge, MD  donepezil (ARICEPT) 10 MG tablet Take 1 tablet (10 mg total) by mouth at bedtime. 05/05/18  Yes Dohmeier, Asencion Partridge, MD  feeding supplement, ENSURE ENLIVE, (ENSURE ENLIVE) LIQD Take 237 mLs by mouth 2 (two) times daily between meals. 08/08/18  Yes Emokpae, Courage, MD  ferrous sulfate (KP FERROUS SULFATE) 325 (65 FE) MG tablet Take 1 tablet (325 mg total) by mouth 3 (three) times daily with meals. 08/08/18  Yes Roxan Hockey, MD  Multiple Vitamin (MULTIVITAMIN WITH MINERALS) TABS tablet Take 1 tablet by mouth daily. 08/09/18  Yes Emokpae, Courage, MD  pravastatin (PRAVACHOL) 20 MG tablet Take 1 tablet (20 mg total) by mouth at bedtime. 06/11/18  Yes Wendie Agreste, MD  sertraline (ZOLOFT) 25 MG tablet Take 1 tablet (25 mg total) by mouth daily. 07/30/18  Yes Wendie Agreste, MD  spironolactone (ALDACTONE) 25 MG tablet Take 1 tablet (25 mg total) by mouth daily. 08/08/18  Yes Roxan Hockey, MD  ACCU-CHEK SOFTCLIX LANCETS lancets Once per day testing. 08/05/17   Wendie Agreste, MD  blood glucose meter kit and supplies Dispense based on patient and insurance preference. Use up to four times daily as directed. (FOR ICD-9 250.00, 250.01). 04/25/17   Wendie Agreste, MD  glucose blood (ACCU-CHEK AVIVA PLUS) test strip 1 each by Other route as needed for other. Use as instructed - once per day testing for now. 07/30/18   Wendie Agreste, MD    Family History Family History  Problem Relation Age of Onset  . Dementia Mother   . Cancer Father   . Diabetes Brother   . Hypertension Brother   . Cancer Brother     Social History Social History   Tobacco Use  .  Smoking status: Former Smoker    Years: 5.00    Types: Cigarettes  . Smokeless tobacco: Never Used  Substance Use Topics  . Alcohol use: No    Frequency: Never  . Drug use: No     Allergies   Ace inhibitors   Review of Systems Review of Systems  Gastrointestinal: Positive for abdominal pain and constipation.  All other systems reviewed and are negative.    Physical Exam Updated Vital Signs BP (!) 143/70   Pulse 72   Temp 98.6 F (37 C) (Oral)   Resp 15   Ht '5\' 9"'  (1.753 m)   Wt 76.7 kg   SpO2 100%   BMI 24.96 kg/m   Physical Exam Vitals signs and nursing note reviewed.  Constitutional:      Appearance: He is well-developed.  HENT:     Head: Normocephalic and atraumatic.  Cardiovascular:     Rate and Rhythm: Normal rate and regular rhythm.     Heart sounds: No murmur.  Pulmonary:     Effort: Pulmonary effort is normal. No respiratory distress.  Breath sounds: Normal breath sounds.  Abdominal:     Palpations: Abdomen is soft.     Tenderness: There is no rebound.     Comments: Moderate lower abdominal tenderness   Musculoskeletal:        General: No swelling or tenderness.  Skin:    General: Skin is warm and dry.  Neurological:     Mental Status: He is alert and oriented to person, place, and time.  Psychiatric:        Behavior: Behavior normal.      ED Treatments / Results  Labs (all labs ordered are listed, but only abnormal results are displayed) Labs Reviewed  COMPREHENSIVE METABOLIC PANEL - Abnormal; Notable for the following components:      Result Value   Potassium 3.3 (*)    CO2 21 (*)    Glucose, Bld 122 (*)    Calcium 8.5 (*)    Albumin 3.1 (*)    Alkaline Phosphatase 167 (*)    Total Bilirubin 1.4 (*)    All other components within normal limits  CBC - Abnormal; Notable for the following components:   RBC 3.01 (*)    Hemoglobin 9.0 (*)    HCT 30.2 (*)    MCV 100.3 (*)    MCHC 29.8 (*)    RDW 16.5 (*)    Platelets 109 (*)      All other components within normal limits  URINALYSIS, ROUTINE W REFLEX MICROSCOPIC - Abnormal; Notable for the following components:   Color, Urine AMBER (*)    APPearance HAZY (*)    All other components within normal limits  LIPASE, BLOOD    EKG None  Radiology Ct Abdomen Pelvis W Contrast  Result Date: 09/18/2018 CLINICAL DATA:  No bowel movement for 5 days. Intermittent mid abdominal pain. EXAM: CT ABDOMEN AND PELVIS WITH CONTRAST TECHNIQUE: Multidetector CT imaging of the abdomen and pelvis was performed using the standard protocol following bolus administration of intravenous contrast. CONTRAST:  133m ISOVUE-300 IOPAMIDOL (ISOVUE-300) INJECTION 61% COMPARISON:  August 06, 2018. FINDINGS: Lower chest: Paraseptal varices, stable. The lower chest is otherwise unchanged and unremarkable. Hepatobiliary: Hepatic steatosis. Known cirrhosis. Previous cholecystectomy. No intra or extrahepatic biliary duct dilatation. The intrahepatic branches of the pulmonary artery are patent. 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 Pancreas: Unremarkable. No pancreatic ductal dilatation or surrounding inflammatory changes. Spleen: The spleen measures 14 cm in cranial caudal dimension, similar in the interval. The spleen is otherwise normal. Adrenals/Urinary Tract: Adrenal glands are normal. Cyst in the posterior right kidney. No suspicious renal masses. No ureterectasis or ureteral stones. The bladder is decompressed but otherwise unremarkable. Stomach/Bowel: The stomach is normal. The small bowel is normal. There is colonic diverticulosis, particularly in the sigmoid colon without diverticulitis. The colon is otherwise normal. The appendix is not seen but there is no secondary evidence of appendicitis. Vascular/Lymphatic: The abdominal aorta demonstrates mild atherosclerotic change. No aneurysm or dissection. No  adenopathy. Reproductive: Prostate is unremarkable. Other: Splenic, gastric, and paraesophageal varices again noted. Minimal fluid in the pericolic gutters, likely due to cirrhosis. No free air. 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 No acute bony abnormalities. IMPRESSION: 1. Cirrhosis with varices and splenomegaly. 2. 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 3. Colonic diverticulosis without diverticulitis. The appendix is not seen but there is no secondary evidence of appendicitis. 4. Abdominal aortic  atherosclerotic change. 5. 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 Electronically Signed   By: Dorise Bullion III M.D   On: 09/18/2018 18:22    Procedures Procedures (including critical care time)  Medications Ordered in ED Medications  sodium chloride flush (NS) 0.9 % injection 3 mL (has no administration in time range)  sodium chloride 0.9 % bolus 500 mL (0 mLs Intravenous Stopped 09/18/18 1816)  iopamidol (ISOVUE-300) 61 % injection 100 mL (100 mLs Intravenous Contrast Given 09/18/18 1740)     Initial Impression / Assessment and Plan / ED Course  I have reviewed the triage vital signs and the nursing notes.  Pertinent labs & imaging results that were available during my care of the patient were reviewed by me and considered in my medical decision making (see chart for details).    Patient with history of cirrhosis here for evaluation of constipation and abdominal pain. He does have tenderness on examination. Labs demonstrate improvement in his chronic anemia. CT abdomen and pelvis obtained due to his tenderness, without acute abnormalities.  On repeat assessment patient states he feels improved, no significant abdominal pain except for on deep palpation. No evidence of acute diverticulitis, UTI. Labs are near his baseline. CT does demonstrate chronic findings. Counseled patient on home care for abdominal pain, possible constipation. Discussed  outpatient follow-up and return precautions.  Final Clinical Impressions(s) / ED Diagnoses   Final diagnoses:  Lower abdominal pain    ED Discharge Orders    None       Quintella Reichert, MD 09/18/18 2203

## 2018-09-18 NOTE — ED Notes (Signed)
Pt is aware a urine sample is needed, but is unable to provide one at this time. Specimen cup provided in lobby.

## 2018-09-22 ENCOUNTER — Other Ambulatory Visit: Payer: Self-pay

## 2018-09-22 NOTE — Patient Outreach (Signed)
[facility]) Care Management  09/22/2018  [name] [birth year] [id number]  73 year old with PMH: Recent observation admission due tosymptomaticanemia requiring blood transfusion. PMH: Orthostatic hypotension; HTN; OSA; post op respiratory failure; liver tumor; hepatocellular cancer; DM; mild cognitive impairment with memory loss; Chronic confusional state; BPH; Pancytopenia; CPAP; Narcolepsy.   Member with one hospitalization within last 6 months and has recent ED visit on 09-18-2018 due to c/o abdominal pain. Associated symptoms: constipation.  Called member today and member answered the phone and HIPPA identifiers verified.   Constipation: Member states that he purchased metamucil as recommended via MD and states small formed stool today. Education provided to member concerning s/s of constipation and when to call 911 as stated on discharge summary. Reinforced education on discharge summary.   Anemia: Discussed member's last hemoglobin level of 7.6 with member. Reinforced education of s/s of blood loss and when to call 911 (obvious bleeding or black tarry stools, c/o  dizziness, fainting.    Member currently denies any needs and denies complaints.   Will follow up with member on Feb. 20, 2020 as previously planned and member aware to call RN CM with any concerns.   [facility] CM Care Plan Problem One     Most Recent Value  Care Plan Problem One  Risk for readmission r/t Knowledge deficit regarding causative factors with disease: Anemia  Role Documenting the Problem One  Care Management Coordinator  Care Plan for Problem One  Active  THN Long Term Goal   Member will describe known facts about Anemia and treatment plan within next 45 days  THN Long Term Goal Start Date  09/22/18  Interventions for Problem One Long Term Goal  Encouraged member to state s/s of anemia,  Reiinforced s/s of anemia,  Evaluated member's knowledge of when to call 911  [facility] CM Short Term Goal #1    Member will discuss HGB levels with PCP at next office visit.  THN CM Short Term Goal #1 Start Date  08/14/18  THN CM Short Term Goal #1 Met Date  09/22/18  THN CM Short Term Goal #2   Member will not experience s/s of anemia within next 30 days  THN CM Short Term Goal #2 Start Date  08/14/18  [facility] CM Short Term Goal #2 Met Date  09/22/18  THN CM Short Term Goal #3  member will state HGB level within next 30 days  THN CM Short Term Goal #3 Start Date  09/22/18  Interventions for Short Tern Goal #3  Assessed member's knowledge of last HGB level,  Provided member with last HGB level of 7.6    THN CM Care Plan Problem Two     Most Recent Value  Care Plan Problem Two  Constipation related to lack of activity as manifested by recent Ed visit with diagnosis of constipation  Role Documenting the Problem Two  Care Management [place] for Problem Two  Active  Interventions for Problem Two Long Term Goal   Assessed for last soft formed stool,  assessed for treatment and prevention stratagies,   THN Long Term Goal  Will will report measures that will prevent recurrence of constipation within next 30 days.  THN Long Term Goal Start Date  09/22/18  [facility] CM Short Term Goal #1   member will report passage of soft formed stool at a frequency percieved as "normal" by the member  Interventions for Short Term Goal #2   Assessed knowledge of s/s  of constipation,  Educated member concerning importance of taking metamucil medication prescribed via MD     [name], [name] Management Coordinator  [phone] [name]

## 2018-10-09 ENCOUNTER — Other Ambulatory Visit: Payer: Self-pay

## 2018-10-09 NOTE — Patient Outreach (Addendum)
[facility]) Care Management  10/09/2018  [name] 1946-02-13 [id number]   Called member at preferred number and HIPPA identifier verified.   Member stated that he has been experiencing nausea; however, states he has been having bowel movements. Member states he is taking Metamucil in the mornings. Encouraged member to speak to his PCP and member verbalized agreement.   Member denies any s/s of bleeding. Member unable to state last HGB levels. Educated member concerning Anemia and hemoglobin levels. Member confirmed his next lab is on 10-15-2018 and his next PCP visit 27-2020. Member encouraged to speak to PCP concerning hemoglobin levels. Member requesting for RN CM to assist member with writing down a list of things to speak to PCP about.   Member states that he needs assistance with dressing and with meals at times. Will send SW consult and member states he will discuss this with his PCP as well.   Will follow up with member at home on 10-15-2018 to assist member with list of things to discuss with his PCP on next visit 10-16-2018.   THN CM Care Plan Problem One     Most Recent Value  Care Plan Problem One  Risk for readmission r/t Knowledge deficit regarding causative factors with disease: Anemia  Role Documenting the Problem One  Care Management Coordinator  Care Plan for Problem One  Active  THN Long Term Goal   Member will describe known facts about Anemia and treatment plan within next 45 days  THN Long Term Goal Start Date  09/22/18  Interventions for Problem One Long Term Goal  Assessed knowledge of anemia,  Assessed for s/s of bleeding  THN CM Short Term Goal #1   Member will discuss HGB levels with PCP on next visit 10-16-2018  [facility] CM Short Term Goal #1 Start Date  10/09/18  Interventions for Short Term Goal #1  Discussed member's lab date of 10-15-2018. Encouraged member to ask PCP about HGB level  THN CM Short Term Goal #2   Member will not experience  s/s of anemia within next 30 days  THN CM Short Term Goal #2 Start Date  08/14/18  [facility] CM Short Term Goal #2 Met Date  09/22/18  THN CM Short Term Goal #3  member will state HGB level within next 30 days  THN CM Short Term Goal #3 Start Date  09/22/18  Interventions for Short Tern Goal #3  Re-inforced education concerning HGB level    THN CM Care Plan Problem Two     Most Recent Value  Care Plan Problem Two  Constipation related to lack of activity as manifested by recent Ed visit with diagnosis of constipation  Role Documenting the Problem Two  Care Management [place] for Problem Two  Active  Interventions for Problem Two Long Term Goal   Assessed whick o-t-c medication member taking to prevent constipation  THN Long Term Goal  Will will report measures that will prevent recurrence of constipation within next 30 days.  THN Long Term Goal Start Date  09/22/18  [facility] CM Short Term Goal #1   member will report passage of soft formed stool at a frequency percieved as "normal" by the member  Interventions for Short Term Goal #2   Encouraged member to discuss with PCP any concerns about whether or not he should continue to take metamucil     [name], [place] Management  Community Care Management Coordinator  [phone] [name]

## 2018-10-09 NOTE — Addendum Note (Signed)
Addended by: Suzie Portela A on: 10/09/2018 08:28 PM   Modules accepted: Orders

## 2018-10-10 ENCOUNTER — Other Ambulatory Visit: Payer: Self-pay | Admitting: Licensed Clinical Social Worker

## 2018-10-10 NOTE — Patient Outreach (Signed)
[facility]) Care Management  10/10/2018  ALASTAIR HENNES [birth year] [id number]  [facility] CSW received new social work consult and referral on 10/10/2018 stating that member is requesting assistance at home with bathing, dressing, and with cooking. [facility] CSW completed initial outreach call to patient on 10/10/2018 and was able to reach him successfully. HIPPA verifications received successfully. [facility] CSW has worked with patient in the past and is familiar with patient and his needs. [facility] CSW reintroduced self, reason for call and of [place] social work support services. Patient reports that he does not want long term care placement at this time and wants to continue to live in his home but needs increased home support in order to live there safely due to ongoing falls and lack of mobility. Patient DOES NOT qualify for Medicaid and therefore will have to pay out of pocket for PCS. However, [facility] CSW is aware of a program called [employer] with DSS which is available to those in the community that aren't eligible for Medicaid but need an aide in the home. Patient is agreeable to [place] making referral to this program. Patient is aware that this current wait list is up to 1 year and a 3-4 months. Patient is also on the wait list for Meals on Wheels and has 6 more months on the wait list as well. [facility] CSW provided education on available senior resources within the area that would be helpful to him and his specific needs. Patient was advised to ask PCP for [facility] Aide involvement to assist with bathing as he is struggling with this currently. Patient has PCP appointment next week. Patient appreciative of call and education. Patient gives permission for [facility] CSW to place him on the wait list for the [employer] [place] wait list. Patient agreeable to social work signing off as well. [facility] CSW contacted DSS on 10/10/2018 and successfully placed referral. Once patient is off the wait list, he will be able to  receive the maximum hours that they offer as he was evaluated at a Level 4. [facility] CSW will route note to [place] and will sign off at this time as patient was agreeable to plan and had all questions/concerns have been answered in terms of community resource/social work needs.  [name], BSW, MSW, [email or web address] Phone: [phone] Fax: [phone]

## 2018-10-14 ENCOUNTER — Other Ambulatory Visit: Payer: Self-pay

## 2018-10-14 NOTE — Patient Outreach (Signed)
[facility]) Care Management  10/14/2018  [name] 13-Apr-1946 [id number]   Called member at preferred number; however no answer and unable to leave HIPPA compliant voicemail message.   Member requested last week for assistance with writing down a list of things to discuss with hie PCP on member's next PCP visit 10-16-2018.   Will await return call from member. Will call member again on tomorrow.   [name], RN-BSN  [facility] Care Management  Community Care Management Coordinator  [phone] [name]

## 2018-10-14 NOTE — Patient Outreach (Signed)
[facility]) Care Management  10/14/2018  [name] 30-Apr-1946 [id number]   Request received from [place], [name], to mail information about [place] through [place].  Mailed today.  [name], BSW Social Worker [phone]

## 2018-10-15 ENCOUNTER — Other Ambulatory Visit: Payer: Self-pay

## 2018-10-15 ENCOUNTER — Other Ambulatory Visit: Payer: Self-pay | Admitting: Neurology

## 2018-10-15 ENCOUNTER — Other Ambulatory Visit: Payer: Self-pay | Admitting: *Deleted

## 2018-10-15 ENCOUNTER — Inpatient Hospital Stay: Payer: Medicare Other

## 2018-10-15 ENCOUNTER — Inpatient Hospital Stay: Payer: Medicare Other | Attending: Oncology

## 2018-10-15 ENCOUNTER — Telehealth: Payer: Self-pay | Admitting: Oncology

## 2018-10-15 ENCOUNTER — Inpatient Hospital Stay (HOSPITAL_BASED_OUTPATIENT_CLINIC_OR_DEPARTMENT_OTHER): Payer: Medicare Other | Admitting: Oncology

## 2018-10-15 VITALS — BP 116/71 | HR 75 | Temp 97.7°F | Resp 19 | Ht 69.0 in | Wt 172.5 lb

## 2018-10-15 DIAGNOSIS — K7460 Unspecified cirrhosis of liver: Secondary | ICD-10-CM | POA: Diagnosis not present

## 2018-10-15 DIAGNOSIS — E119 Type 2 diabetes mellitus without complications: Secondary | ICD-10-CM | POA: Insufficient documentation

## 2018-10-15 DIAGNOSIS — D61818 Other pancytopenia: Secondary | ICD-10-CM | POA: Diagnosis not present

## 2018-10-15 DIAGNOSIS — D500 Iron deficiency anemia secondary to blood loss (chronic): Secondary | ICD-10-CM

## 2018-10-15 DIAGNOSIS — Z8505 Personal history of malignant neoplasm of liver: Secondary | ICD-10-CM | POA: Insufficient documentation

## 2018-10-15 DIAGNOSIS — C220 Liver cell carcinoma: Secondary | ICD-10-CM

## 2018-10-15 LAB — CBC WITH DIFFERENTIAL (CANCER CENTER ONLY)
Abs Immature Granulocytes: 0 10*3/uL (ref 0.00–0.07)
Basophils Absolute: 0 10*3/uL (ref 0.0–0.1)
Basophils Relative: 1 %
Eosinophils Absolute: 0 10*3/uL (ref 0.0–0.5)
Eosinophils Relative: 1 %
HCT: 26.8 % — ABNORMAL LOW (ref 39.0–52.0)
Hemoglobin: 8.1 g/dL — ABNORMAL LOW (ref 13.0–17.0)
Immature Granulocytes: 0 %
Lymphocytes Relative: 22 %
Lymphs Abs: 0.6 10*3/uL — ABNORMAL LOW (ref 0.7–4.0)
MCH: 29 pg (ref 26.0–34.0)
MCHC: 30.2 g/dL (ref 30.0–36.0)
MCV: 96.1 fL (ref 80.0–100.0)
Monocytes Absolute: 0.3 10*3/uL (ref 0.1–1.0)
Monocytes Relative: 11 %
Neutro Abs: 1.9 10*3/uL (ref 1.7–7.7)
Neutrophils Relative %: 65 %
Platelet Count: 104 10*3/uL — ABNORMAL LOW (ref 150–400)
RBC: 2.79 MIL/uL — ABNORMAL LOW (ref 4.22–5.81)
RDW: 16 % — ABNORMAL HIGH (ref 11.5–15.5)
WBC Count: 2.9 10*3/uL — ABNORMAL LOW (ref 4.0–10.5)
nRBC: 0 % (ref 0.0–0.2)

## 2018-10-15 LAB — FERRITIN: Ferritin: 22 ng/mL — ABNORMAL LOW (ref 24–336)

## 2018-10-15 MED ORDER — DONEPEZIL HCL 10 MG PO TABS: 10.0000 mg | ORAL_TABLET | Freq: Every day | ORAL | 1 refills | Status: DC

## 2018-10-15 NOTE — Progress Notes (Signed)
Reports he was out of his ferrous sulfate for about 1 week. Started taking again on 10/12/18.

## 2018-10-15 NOTE — Telephone Encounter (Signed)
Scheduled appt per 2/26 los. ° °Printed calendar and avs. °

## 2018-10-15 NOTE — Progress Notes (Signed)
[name] OFFICE PROGRESS NOTE   Diagnosis: Cirrhosis, pancytopenia  INTERVAL HISTORY:   [name] returns as scheduled.  He reports malaise.  He felt better after a red cell transfusion in December.  He fell in October and fractured a right rib.  He continues to have discomfort in this area.  No bleeding.  Objective:  Vital signs in last 24 hours:  Blood pressure 116/71, pulse 75, temperature 97.7 F (36.5 C), temperature source Oral, resp. rate 19, height 5\' 9"  (1.753 m), weight 172 lb 8 oz (78.2 kg), SpO2 100 %.    Resp: Lungs clear bilaterally Cardio: Regular rate and rhythm GI: Nontender, no hepatosplenomegaly, no apparent ascites Vascular: No leg edema Skill skeletal: Tender at the right low anterior lateral ribs  Lab Results:  Lab Results  Component Value Date   WBC 2.9 (L) 10/15/2018   HGB 8.1 (L) 10/15/2018   HCT 26.8 (L) 10/15/2018   MCV 96.1 10/15/2018   PLT 104 (L) 10/15/2018   NEUTROABS 1.9 10/15/2018    CMP  Lab Results  Component Value Date   NA 137 09/18/2018   K 3.3 (L) 09/18/2018   CL 108 09/18/2018   CO2 21 (L) 09/18/2018   GLUCOSE 122 (H) 09/18/2018   BUN 10 09/18/2018   CREATININE 1.17 09/18/2018   CALCIUM 8.5 (L) 09/18/2018   PROT 6.8 09/18/2018   ALBUMIN 3.1 (L) 09/18/2018   AST 39 09/18/2018   ALT 35 09/18/2018   ALKPHOS 167 (H) 09/18/2018   BILITOT 1.4 (H) 09/18/2018   GFRNONAA >60 09/18/2018   GFRAA >60 09/18/2018     Medications: I have reviewed the patient's current medications.   Assessment/Plan: 1. Hepatocellular carcinoma, stage I (T1 NX) status post partial left hepatectomy 03/02/2013.  08/31/2013 AFP 2.4.   CT abdomen 08/31/2013 with postoperative changes of partial hepatectomy with small postoperative fluid collection along the resection margin. No definite signs to suggest residual or locally recurrent disease. Several borderline enlarged and minimally enlarged lymph nodes superior to the  liver in the juxtapericardiac fat of the lower anterior mediastinum with the largest measuring 11 mm slightly increased compared to the prior study 01/15/2013. Several prominent non-pathologically enlarged upper abdominal ligament lymph nodes similar to the prior study. Small esophageal varices.   CT abdomen 03/03/2012 without evidence of recurrent hepatocellular carcinoma, stable right lung base nodule  CT the abdomen and pelvis 09/06/2014 without evidence of recurrent hepatocellular carcinoma, slight enlargement of pre-cardiac lymph nodes  CT abdomen/pelvis 09/07/2015 with no findings for recurrent tumor or metastatic disease. Stable small 3 mm right middle lobe pulmonary nodule since 2014. Epicardial lymph node measured 11 mm, previously 13 mm. Stable cirrhotic changes. Fairly significant esophageal varices.  CT 09/03/2016-negative for recurrent hepatocellular carcinoma, changes of cirrhosis with varices  CT 10/15/2017- no evidence of recurrent hepatocellular carcinoma, cirrhosis/varices 2. Cirrhosis. 3. Hepatitis B core antibody positive. 4. Diabetes. 5. Severe microcytic anemia-likely iron deficiency anemia-progressive 06/03/2017. Stool Hemoccult positive 3 06/05/2017. Hemoglobin stable 6. Colonoscopy 12/27/2016-external and internal hemorrhoids. Diverticulosis in the sigmoid, descending and transverse colon. One medium polyp in the distal descending colon. 3 small polyps in the transverse colon. 2 diminutive polyps in the rectum and the proximal ascending colon. Medium-sized lipoma transverse colon. Small lipoma mid ascending colon. Multiplenonbleeding colonic angiodysplastic lesions. Pathology-tubular adenomas, hyperplastic polyps. 7. Pancytopenia secondary to cirrhosis and GI bleeding 8. Laparoscopic cholecystectomy 01/28/2018    Disposition: [name] appears stable.  He has severe anemia secondary to cirrhosis, GI bleeding, and  a history of iron deficiency.  We will follow-up  on the ferritin from today.  We will arrange for a red cell transfusion.  I will follow-up on the most recent endoscopy report from Dr. Watt Climes. [name] remains in clinical remission from hepatocellular carcinoma.  He will return for an office visit and repeat CBC in 3 weeks.  Betsy Coder, MD  10/15/2018  9:25 AM

## 2018-10-15 NOTE — Patient Outreach (Signed)
[facility]) Care Management  10/15/2018  WITT PLITT 10-12-1945 [id number]   Called member at preferred number and member answered phone. Member, last week, requested for RN CM to assist with writing a list of things to discuss with his PCP on tomorrow 10-16-2018. Member requested for RN CM to place list in his mailbox. Discussed with member items listed. 1. Member requesting Home PCA; member to ask PCP for order for Home health Aide. SW also working with member for assistance at home.  2. Member c/o nausea at times and member with recent hospitalization for constipation. Member taking metamucil and reports that medication is working; however, his bowel movements are not as at his regular pattern. 3 Member with PMH of anemia. Member's last hemoglobin level 9. Normal values 13 to 17. Member to ask PCP for stated gaol of HBG level  Member stated that he visited his "cancer doctor" today and MD recommending for member to go on Saturday to hospital for blood transfusion. .   Member denies any further concerns and verbalizes appreciation of [facility] services.  Will place list in member's mailbox today to discuss with his PCP tomorrow.   [name], RN-BSN  [facility] Care Management  Community Care Management Coordinator  [phone] [email or web address]

## 2018-10-16 ENCOUNTER — Encounter

## 2018-10-16 ENCOUNTER — Telehealth: Payer: Self-pay | Admitting: *Deleted

## 2018-10-16 ENCOUNTER — Ambulatory Visit (INDEPENDENT_AMBULATORY_CARE_PROVIDER_SITE_OTHER): Payer: Medicare Other | Admitting: Family Medicine

## 2018-10-16 ENCOUNTER — Encounter: Payer: Self-pay | Admitting: Family Medicine

## 2018-10-16 ENCOUNTER — Other Ambulatory Visit: Payer: Self-pay

## 2018-10-16 VITALS — BP 123/65 | HR 80 | Temp 98.2°F | Resp 16 | Ht 69.0 in | Wt 175.0 lb

## 2018-10-16 DIAGNOSIS — E876 Hypokalemia: Secondary | ICD-10-CM

## 2018-10-16 DIAGNOSIS — Z9181 History of falling: Secondary | ICD-10-CM

## 2018-10-16 DIAGNOSIS — D649 Anemia, unspecified: Secondary | ICD-10-CM

## 2018-10-16 DIAGNOSIS — R2689 Other abnormalities of gait and mobility: Secondary | ICD-10-CM

## 2018-10-16 DIAGNOSIS — K5900 Constipation, unspecified: Secondary | ICD-10-CM | POA: Diagnosis not present

## 2018-10-16 LAB — AFP TUMOR MARKER: AFP, Serum, Tumor Marker: 2.8 ng/mL (ref 0.0–8.3)

## 2018-10-16 NOTE — Patient Instructions (Addendum)
Ok to continue metamucil to help prevent constipation. If that worsens - return for recheck. miralax over the counter if needed on occasion.   I will ask home health to evaluate for safety at home and other needs as we discussed.   Use cane to lessen risk of falls. See other fall prevention info below.   Anemia may be a cause of some of your symptoms. Keep on iron, follow up for transfusion as planned in few days and keep follow up with oncologist.   I will check some electrolytes, but follow up with me in 3-4 weeks for repeat blood sugar testing.   Return to the clinic or go to the nearest emergency room if any of your symptoms worsen or new symptoms occur.   Fall Prevention in the Home, Adult Falls can cause injuries and can affect people from all age groups. There are many simple things that you can do to make your home safe and to help prevent falls. Ask for help when making these changes, if needed. What actions can I take to prevent falls? General instructions  Use good lighting in all rooms. Replace any light bulbs that burn out.  Turn on lights if it is dark. Use night-lights.  Place frequently used items in easy-to-reach places. Lower the shelves around your home if necessary.  Set up furniture so that there are clear paths around it. Avoid moving your furniture around.  Remove throw rugs and other tripping hazards from the floor.  Avoid walking on wet floors.  Fix any uneven floor surfaces.  Add color or contrast paint or tape to grab bars and handrails in your home. Place contrasting color strips on the first and last steps of stairways.  When you use a stepladder, make sure that it is completely opened and that the sides are firmly locked. Have someone hold the ladder while you are using it. Do not climb a closed stepladder.  Be aware of any and all pets. What can I do in the bathroom?      Keep the floor dry. Immediately clean up any water that spills onto the  floor.  Remove soap buildup in the tub or shower on a regular basis.  Use non-skid mats or decals on the floor of the tub or shower.  Attach bath mats securely with double-sided, non-slip rug tape.  If you need to sit down while you are in the shower, use a plastic, non-slip stool.  Install grab bars by the toilet and in the tub and shower. Do not use towel bars as grab bars. What can I do in the bedroom?  Make sure that a bedside light is easy to reach.  Do not use oversized bedding that drapes onto the floor.  Have a firm chair that has side arms to use for getting dressed. What can I do in the kitchen?  Clean up any spills right away.  If you need to reach for something above you, use a sturdy step stool that has a grab bar.  Keep electrical cables out of the way.  Do not use floor polish or wax that makes floors slippery. If you must use wax, make sure that it is non-skid floor wax. What can I do in the stairways?  Do not leave any items on the stairs.  Make sure that you have a light switch at the top of the stairs and the bottom of the stairs. Have them installed if you do not have them.  Make sure that there are handrails on both sides of the stairs. Fix handrails that are broken or loose. Make sure that handrails are as long as the stairways.  Install non-slip stair treads on all stairs in your home.  Avoid having throw rugs at the top or bottom of stairways, or secure the rugs with carpet tape to prevent them from moving.  Choose a carpet design that does not hide the edge of steps on the stairway.  Check any carpeting to make sure that it is firmly attached to the stairs. Fix any carpet that is loose or worn. What can I do on the outside of my home?  Use bright outdoor lighting.  Regularly repair the edges of walkways and driveways and fix any cracks.  Remove high doorway thresholds.  Trim any shrubbery on the main path into your home.  Regularly check  that handrails are securely fastened and in good repair. Both sides of any steps should have handrails.  Install guardrails along the edges of any raised decks or porches.  Clear walkways of debris and clutter, including tools and rocks.  Have leaves, snow, and ice cleared regularly.  Use sand or salt on walkways during winter months.  In the garage, clean up any spills right away, including grease or oil spills. What other actions can I take?  Wear closed-toe shoes that fit well and support your feet. Wear shoes that have rubber soles or low heels.  Use mobility aids as needed, such as canes, walkers, scooters, and crutches.  Review your medicines with your health care provider. Some medicines can cause dizziness or changes in blood pressure, which increase your risk of falling. Talk with your health care provider about other ways that you can decrease your risk of falls. This may include working with a physical therapist or trainer to improve your strength, balance, and endurance. Where to find more information  Centers for Disease Control and Prevention, STEADI: WebmailGuide.co.za  [employer] on Aging: BrainJudge.co.uk Contact a health care provider if:  You are afraid of falling at home.  You feel weak, drowsy, or dizzy at home.  You fall at home. Summary  There are many simple things that you can do to make your home safe and to help prevent falls.  Ways to make your home safe include removing tripping hazards and installing grab bars in the bathroom.  Ask for help when making these changes in your home. This information is not intended to replace advice given to you by your health care provider. Make sure you discuss any questions you have with your health care provider. Document Released: 07/27/2002 Document Revised: 03/21/2017 Document Reviewed: 03/21/2017 Elsevier Interactive Patient Education  2019 [employer].   Constipation,  Adult Constipation is when a person has fewer bowel movements in a week than normal, has difficulty having a bowel movement, or has stools that are dry, hard, or larger than normal. Constipation may be caused by an underlying condition. It may become worse with age if a person takes certain medicines and does not take in enough fluids. Follow these instructions at home: Eating and drinking   Eat foods that have a lot of fiber, such as fresh fruits and vegetables, whole grains, and beans.  Limit foods that are high in fat, low in fiber, or overly processed, such as french fries, hamburgers, cookies, candies, and soda.  Drink enough fluid to keep your urine clear or pale yellow. General instructions  Exercise regularly or as told  by your health care provider.  Go to the restroom when you have the urge to go. Do not hold it in.  Take over-the-counter and prescription medicines only as told by your health care provider. These include any fiber supplements.  Practice pelvic floor retraining exercises, such as deep breathing while relaxing the lower abdomen and pelvic floor relaxation during bowel movements.  Watch your condition for any changes.  Keep all follow-up visits as told by your health care provider. This is important. Contact a health care provider if:  You have pain that gets worse.  You have a fever.  You do not have a bowel movement after 4 days.  You vomit.  You are not hungry.  You lose weight.  You are bleeding from the anus.  You have thin, pencil-like stools. Get help right away if:  You have a fever and your symptoms suddenly get worse.  You leak stool or have blood in your stool.  Your abdomen is bloated.  You have severe pain in your abdomen.  You feel dizzy or you faint. This information is not intended to replace advice given to you by your health care provider. Make sure you discuss any questions you have with your health care provider. Document  Released: 05/04/2004 Document Revised: 02/24/2016 Document Reviewed: 01/25/2016 Elsevier Interactive Patient Education  [employer].    If you have lab work done today you will be contacted with your lab results within the next 2 weeks.  If you have not heard from [place] then please contact us. The fastest way to get your results is to register for My Chart.   IF you received an x-ray today, you will receive an invoice from [facility]. Please contact [facility] at [phone] with questions or concerns regarding your invoice.   IF you received labwork today, you will receive an invoice from [facility]. Please contact [facility] at [phone] with questions or concerns regarding your invoice.   Our billing staff will not be able to assist you with questions regarding bills from these companies.  You will be contacted with the lab results as soon as they are available. The fastest way to get your results is to activate your My Chart account. Instructions are located on the last page of this paperwork. If you have not heard from [place] regarding the results in 2 weeks, please contact this office.

## 2018-10-16 NOTE — Progress Notes (Signed)
Subjective:    Patient ID: [name], male    DOB: [birth year], 73 y.o.   MRN: [id number]  HPI [name] is a 73 y.o. male Presents today for: Chief Complaint  Patient presents with  . Abdominal Pain    here today for a f/u from hospital visit on 09/18/18. Abd pain is much better. Patient also stated he has an appt to go back to the ED on 10/18/18 to have a blood transfusion  . Fall    Patient stated he has multipal falls this past year and not sure what is causing him to fall. His last fall was at least 6-7 wk ago and he broke his rib on the right side   History multiple medical problems including pancytopenia, mild cognitive impairment, narcolepsy, OSA diabetes, hypertension.   Previously received transfusion for symptomatic anemia, hemoglobin 6.7-7.8 in December.  Aldactone had been decreased to daily use.  Metformin was ultimately stopped as his A1c had decreased to 4.8 in December.  Losartan also decreased due to concern for low blood pressure and fatigue/dizziness.  He is followed by hematology/oncology at [facility].  When I saw him in January he was working with physical therapy, denied new fatigue, lightheadedness or dizziness.  Was eating more and using Ensure supplements as well as iron 3 times per day, multivitamin 1/day.  Hemoglobin 7.5 at that time.  Heme-negative stool, blood pressure was stable in office, not orthostatic at that time.  Repeat hemoglobin 7.6 on January 15, improved to 9 on January 30, 8.1 yesterday at appointment with oncologist/hematologist.  Did note he had been out of his iron for about a week but restarted on February 23.Severe anemia thought to be secondary to cirrhosis, GI bleeding and history of iron deficiency.  Plan for red blood cell transfusion at office visit yesterday.  Plan to recheck CBC in 3 weeks  He was seen at [facility], ER on January 30 with lower abdominal pain. Had a constipation at that time.  Normal CT without  acute abnormalities.  Has had some falls in past year.  R T10 rib fracture in October last year.  [address] going up stairs a few weeks ago - caught self with handrail. Not sure how fell - no injury.  Less lightheadedness - prior daily symptoms. Some fatigue - blood transfusion this Saturday.  Taking iron 3 times per day.  Occasional soreness in abdomen - RUQ.. no n/v.. last BM last night, daily BM. Taking metamucil.  Some difficulty with washing legs difficulty getting into tub, some difficulty with getting out of bed at times, then does not fix food -2-3 times per week.  Denies depression.  Would like home health eval for possible help bathing, dressing and meals at times.  Has [facility] care management - discussed needs yesterday.  Home health aide.  Drives to doctor's appointments.  [address] out of bathtub in past - ?3 weeks ago. No injuries.  Has not had PT.  Has used cane for stability- at home as well.   Hypokalemia - K 3.3 at ER visit 09/18/18.  Glucose 122 last month.   Patient Active Problem List   Diagnosis Date Noted  . Orthostatic hypotension 08/06/2018  . Hypertension 08/06/2018  . Hyperlipidemia 08/06/2018  . Pancytopenia ([place]) 08/06/2018  . 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 Insufficient treatment with nasal CPAP 05/05/2018  . 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 Irregular heart  beat 01/28/2018  . Noncompliance with CPAP treatment 08/14/2017  . Excessive daytime sleepiness 04/09/2017  . Snoring 04/09/2017  . Memory impairment of gradual onset 04/09/2017  . Aortic atherosclerosis ([place]) 11/27/2016  . 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 Altered mental status 03/05/2013  . Respiratory failure, post-operative ([place]) 03/02/2013  . Hepatitis B 02/24/2013  . Cancer, hepatocellular ([place]) 02/10/2013  . Liver tumor-bleeding 01/15/2013  . Epidermal cyst 06/18/2012  . OSA (obstructive  sleep apnea) 03/13/2012  . Abnormal leg movement 12/12/2011  . Blood pressure elevated 09/06/2011  . Diabetes mellitus ([place]) 09/06/2011  . 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 BPH (benign prostatic hyperplasia) 09/06/2011  . Diverticula of colon 09/06/2011   Past Medical History:  Diagnosis Date  . Cancer [facility])    hepatocellular cancer   . Diabetes mellitus without complication ([place])   . Dyspnea   . Hepatitis B   . Hyperlipidemia   . Hypertension   . Irregular heart beat 01/28/2018  . Narcolepsy    per office visit note of 08/2011   . Sleep apnea    cpap   Past Surgical History:  Procedure Laterality Date  . CHOLECYSTECTOMY N/A 01/28/2018   Procedure: LAPAROSCOPIC CHOLECYSTECTOMY;  Surgeon: [name], MD;  Location: [place];  Service: General;  Laterality: N/A;  . COLONOSCOPY WITH PROPOFOL N/A 08/27/2017   Procedure: COLONOSCOPY WITH PROPOFOL;  Surgeon: Clarene Essex, MD;  Location: [facility];  Service: Endoscopy;  Laterality: N/A;  . HOT HEMOSTASIS N/A 08/27/2017   Procedure: HOT HEMOSTASIS (ARGON PLASMA COAGULATION/BICAP);  Surgeon: Clarene Essex, MD;  Location: [facility];  Service: Endoscopy;  Laterality: N/A;  . LAPAROSCOPIC CHOLECYSTECTOMY  01/28/2018  . LAPAROSCOPY  03/02/2013   Procedure: LAPAROSCOPY DIAGNOSTIC;  Surgeon: [name], MD;  Location: [facility];  Service: General;;  . LIVER ULTRASOUND  03/02/2013   Procedure: LIVER ULTRASOUND;  Surgeon: [name], MD;  Location: [facility];  Service: General;;  . OPEN PARTIAL HEPATECTOMY   03/02/2013   Procedure: OPEN PARTIAL HEPATECTOMY [83];  Surgeon: [name], MD;  Location: [facility];  Service: General;;  DX LAPAROSCOPY, INTRAOPERATIVE LIVER ULTRASOUND, OPEN PARTIAL HEPATECTOMY  . pinched nerve in back     Allergies  Allergen Reactions  . Ace Inhibitors Swelling and Other (See Comments)    Angioedema - face.    Prior to Admission medications   Medication Sig Start Date End Date Taking? Authorizing Provider  ACCU-CHEK SOFTCLIX  LANCETS lancets Once per day testing. 08/05/17  Yes Wendie Agreste, MD  alfuzosin (UROXATRAL) 10 MG 24 hr tablet Take 1 tablet (10 mg total) by mouth at bedtime. 06/11/18  Yes Wendie Agreste, MD  amphetamine-dextroamphetamine (ADDERALL XR) 30 MG 24 hr capsule Take daily by mouth with water 30 minutes before breakfast. 05/05/18  Yes Dohmeier, Asencion Partridge, MD  blood glucose meter kit and supplies Dispense based on patient and insurance preference. Use up to four times daily as directed. (FOR ICD-9 250.00, 250.01). 04/25/17  Yes Wendie Agreste, MD  donepezil (ARICEPT) 10 MG tablet Take 1 tablet (10 mg total) by mouth at bedtime. 10/15/18  Yes Dohmeier, Asencion Partridge, MD  feeding supplement, ENSURE ENLIVE, (ENSURE ENLIVE) LIQD Take 237 mLs by mouth 2 (two) times daily between meals. 08/08/18  Yes Emokpae, Courage, MD  ferrous sulfate (KP FERROUS SULFATE) 325 (65 FE) MG tablet Take 1 tablet (325 mg total) by mouth 3 (three) times daily with meals. 08/08/18  Yes Emokpae, Courage, MD  glucose blood (ACCU-CHEK AVIVA PLUS) test  strip 1 each by Other route as needed for other. Use as instructed - once per day testing for now. 07/30/18  Yes Wendie Agreste, MD  Multiple Vitamin (MULTIVITAMIN WITH MINERALS) TABS tablet Take 1 tablet by mouth daily. 08/09/18  Yes Emokpae, Courage, MD  pravastatin (PRAVACHOL) 20 MG tablet Take 1 tablet (20 mg total) by mouth at bedtime. 06/11/18  Yes Wendie Agreste, MD  psyllium (METAMUCIL) 58.6 % powder Take 1 packet by mouth daily. Member purchased over-the-counter 09/22/18  Yes [provider]  sertraline (ZOLOFT) 25 MG tablet Take 1 tablet (25 mg total) by mouth daily. 07/30/18  Yes Wendie Agreste, MD  spironolactone (ALDACTONE) 25 MG tablet Take 1 tablet (25 mg total) by mouth daily. 08/08/18  Yes Roxan Hockey, MD   Social History   Socioeconomic History  . Marital status: Divorced    Spouse name: Not on file  . Number of children: 2  . Years of education: 15+   . Highest education level: Bachelor's degree (e.g., BA, AB, BS)  Occupational History  . Not on file  Social Needs  . Financial resource strain: Very hard  . Food insecurity:    Worry: Sometimes true    Inability: Sometimes true  . Transportation needs:    Medical: No    Non-medical: No  Tobacco Use  . Smoking status: Former Smoker    Years: 5.00    Types: Cigarettes  . Smokeless tobacco: Never Used  Substance and Sexual Activity  . Alcohol use: No    Frequency: Never  . Drug use: No  . Sexual activity: Not Currently  Lifestyle  . Physical activity:    Days per week: 0 days    Minutes per session: 0 min  . Stress: Not at all  Relationships  . Social connections:    Talks on phone: Once a week    Gets together: Once a week    Attends religious service: 1 to 4 times per year    Active member of club or organization: No    Attends meetings of clubs or organizations: Never    Relationship status: Divorced  . Intimate partner violence:    Fear of current or ex partner: Not on file    Emotionally abused: Not on file    Physically abused: Not on file    Forced sexual activity: Not on file  Other Topics Concern  . Not on file  Social History Narrative   Patient is single and lives alone- became widower when 1 child was 67 year old, divorced 2nd marriage   Has #2 grown children, not in area or involved in his life   Patient is retired.   Patient has a college education.   Patient is right-handed.   Patient drinks maybe two sodas daily.    Review of Systems     Objective:   Physical Exam Vitals signs reviewed.  Constitutional:      Appearance: He is well-developed.  HENT:     Head: Normocephalic and atraumatic.  Eyes:     Pupils: Pupils are equal, round, and reactive to light.  Neck:     Vascular: No carotid bruit or JVD.  Cardiovascular:     Rate and Rhythm: Normal rate and regular rhythm.     Heart sounds: Normal heart sounds. No murmur.  Pulmonary:      Effort: Pulmonary effort is normal.     Breath sounds: Normal breath sounds. No rales.  Abdominal:  Tenderness: There is no abdominal tenderness.  Skin:    General: Skin is warm and dry.  Neurological:     Mental Status: He is alert and oriented to person, place, and time.    Vitals:   10/16/18 1411  BP: 123/65  Pulse: 80  Resp: 16  Temp: 98.2 F (36.8 C)  TempSrc: Oral  SpO2: 100%  Weight: 175 lb (79.4 kg)  Height: '5\' 9"'$  (1.753 m)   Results for orders placed or performed in visit on 38/18/29  Basic metabolic panel  Result Value Ref Range   Glucose 108 (H) 65 - 99 mg/dL   BUN 10 8 - 27 mg/dL   Creatinine, Ser 1.01 0.76 - 1.27 mg/dL   GFR calc non Af Amer 73 >59 mL/min/1.73   GFR calc Af Amer 84 >59 mL/min/1.73   BUN/Creatinine Ratio 10 10 - 24   Sodium 143 134 - 144 mmol/L   Potassium 4.4 3.5 - 5.2 mmol/L   Chloride 109 (H) 96 - 106 mmol/L   CO2 22 20 - 29 mmol/L   Calcium 8.8 8.6 - 10.2 mg/dL       Assessment & Plan:   KAUSHIK MAUL is a 73 y.o. male History of falling - Plan: Ambulatory referral to Home Health Anemia, unspecified type - Plan: Ambulatory referral to [place] problem - Plan: Ambulatory referral to Home Health  -Chronic anemia with other chronic medical issues as above.  Some episodes of general fatigue, difficulty with activities of daily living as above.  We will request home health evaluation for both safety as well as assess needs for personal aide, physical therapy.  Use cane to lessen risk of falls, handout given on fall prevention at home.  Constipation, unspecified constipation type  -Metamucil daily should help but also an option of MiraLAX if needed for bowel regimen.  Likely complicated by use of iron supplements.  Hypokalemia - Plan: Basic metabolic panel  -Repeat testing reassuring  No orders of the defined types were placed in this encounter.  Patient Instructions    Ok to continue metamucil to help prevent  constipation. If that worsens - return for recheck. miralax over the counter if needed on occasion.   I will ask home health to evaluate for safety at home and other needs as we discussed.   Use cane to lessen risk of falls. See other fall prevention info below.   Anemia may be a cause of some of your symptoms. Keep on iron, follow up for transfusion as planned in few days and keep follow up with oncologist.   I will check some electrolytes, but follow up with me in 3-4 weeks for repeat blood sugar testing.   Return to the clinic or go to the nearest emergency room if any of your symptoms worsen or new symptoms occur.   Fall Prevention in the Home, Adult Falls can cause injuries and can affect people from all age groups. There are many simple things that you can do to make your home safe and to help prevent falls. Ask for help when making these changes, if needed. What actions can I take to prevent falls? General instructions  Use good lighting in all rooms. Replace any light bulbs that burn out.  Turn on lights if it is dark. Use night-lights.  Place frequently used items in easy-to-reach places. Lower the shelves around your home if necessary.  Set up furniture so that there are clear paths around it. Avoid moving your  furniture around.  Remove throw rugs and other tripping hazards from the floor.  Avoid walking on wet floors.  Fix any uneven floor surfaces.  Add color or contrast paint or tape to grab bars and handrails in your home. Place contrasting color strips on the first and last steps of stairways.  When you use a stepladder, make sure that it is completely opened and that the sides are firmly locked. Have someone hold the ladder while you are using it. Do not climb a closed stepladder.  Be aware of any and all pets. What can I do in the bathroom?      Keep the floor dry. Immediately clean up any water that spills onto the floor.  Remove soap buildup in the tub  or shower on a regular basis.  Use non-skid mats or decals on the floor of the tub or shower.  Attach bath mats securely with double-sided, non-slip rug tape.  If you need to sit down while you are in the shower, use a plastic, non-slip stool.  Install grab bars by the toilet and in the tub and shower. Do not use towel bars as grab bars. What can I do in the bedroom?  Make sure that a bedside light is easy to reach.  Do not use oversized bedding that drapes onto the floor.  Have a firm chair that has side arms to use for getting dressed. What can I do in the kitchen?  Clean up any spills right away.  If you need to reach for something above you, use a sturdy step stool that has a grab bar.  Keep electrical cables out of the way.  Do not use floor polish or wax that makes floors slippery. If you must use wax, make sure that it is non-skid floor wax. What can I do in the stairways?  Do not leave any items on the stairs.  Make sure that you have a light switch at the top of the stairs and the bottom of the stairs. Have them installed if you do not have them.  Make sure that there are handrails on both sides of the stairs. Fix handrails that are broken or loose. Make sure that handrails are as long as the stairways.  Install non-slip stair treads on all stairs in your home.  Avoid having throw rugs at the top or bottom of stairways, or secure the rugs with carpet tape to prevent them from moving.  Choose a carpet design that does not hide the edge of steps on the stairway.  Check any carpeting to make sure that it is firmly attached to the stairs. Fix any carpet that is loose or worn. What can I do on the outside of my home?  Use bright outdoor lighting.  Regularly repair the edges of walkways and driveways and fix any cracks.  Remove high doorway thresholds.  Trim any shrubbery on the main path into your home.  Regularly check that handrails are securely fastened and in  good repair. Both sides of any steps should have handrails.  Install guardrails along the edges of any raised decks or porches.  Clear walkways of debris and clutter, including tools and rocks.  Have leaves, snow, and ice cleared regularly.  Use sand or salt on walkways during winter months.  In the garage, clean up any spills right away, including grease or oil spills. What other actions can I take?  Wear closed-toe shoes that fit well and support your feet. Wear shoes  that have rubber soles or low heels.  Use mobility aids as needed, such as canes, walkers, scooters, and crutches.  Review your medicines with your health care provider. Some medicines can cause dizziness or changes in blood pressure, which increase your risk of falling. Talk with your health care provider about other ways that you can decrease your risk of falls. This may include working with a physical therapist or trainer to improve your strength, balance, and endurance. Where to find more information  Centers for Disease Control and Prevention, [place]: WebmailGuide.co.za  [employer] on Aging: BrainJudge.co.uk Contact a health care provider if:  You are afraid of falling at home.  You feel weak, drowsy, or dizzy at home.  You fall at home. Summary  There are many simple things that you can do to make your home safe and to help prevent falls.  Ways to make your home safe include removing tripping hazards and installing grab bars in the bathroom.  Ask for help when making these changes in your home. This information is not intended to replace advice given to you by your health care provider. Make sure you discuss any questions you have with your health care provider. Document Released: 07/27/2002 Document Revised: 03/21/2017 Document Reviewed: 03/21/2017 Elsevier Interactive Patient Education  2019 [employer].   Constipation, Adult Constipation is when a person has fewer bowel  movements in a week than normal, has difficulty having a bowel movement, or has stools that are dry, hard, or larger than normal. Constipation may be caused by an underlying condition. It may become worse with age if a person takes certain medicines and does not take in enough fluids. Follow these instructions at home: Eating and drinking   Eat foods that have a lot of fiber, such as fresh fruits and vegetables, whole grains, and beans.  Limit foods that are high in fat, low in fiber, or overly processed, such as french fries, hamburgers, cookies, candies, and soda.  Drink enough fluid to keep your urine clear or pale yellow. General instructions  Exercise regularly or as told by your health care provider.  Go to the restroom when you have the urge to go. Do not hold it in.  Take over-the-counter and prescription medicines only as told by your health care provider. These include any fiber supplements.  Practice pelvic floor retraining exercises, such as deep breathing while relaxing the lower abdomen and pelvic floor relaxation during bowel movements.  Watch your condition for any changes.  Keep all follow-up visits as told by your health care provider. This is important. Contact a health care provider if:  You have pain that gets worse.  You have a fever.  You do not have a bowel movement after 4 days.  You vomit.  You are not hungry.  You lose weight.  You are bleeding from the anus.  You have thin, pencil-like stools. Get help right away if:  You have a fever and your symptoms suddenly get worse.  You leak stool or have blood in your stool.  Your abdomen is bloated.  You have severe pain in your abdomen.  You feel dizzy or you faint. This information is not intended to replace advice given to you by your health care provider. Make sure you discuss any questions you have with your health care provider. Document Released: 05/04/2004 Document Revised: 02/24/2016  Document Reviewed: 01/25/2016 Elsevier Interactive Patient Education  [employer].    If you have lab work done today you  will be contacted with your lab results within the next 2 weeks.  If you have not heard from [place] then please contact us. The fastest way to get your results is to register for My Chart.   IF you received an x-ray today, you will receive an invoice from [facility]. Please contact [facility] at [phone] with questions or concerns regarding your invoice.   IF you received labwork today, you will receive an invoice from [facility]. Please contact [facility] at [phone] with questions or concerns regarding your invoice.   Our billing staff will not be able to assist you with questions regarding bills from these companies.  You will be contacted with the lab results as soon as they are available. The fastest way to get your results is to activate your My Chart account. Instructions are located on the last page of this paperwork. If you have not heard from [place] regarding the results in 2 weeks, please contact this office.       Signed,   [name], MD [facility] [place].  10/19/18 8:40 AM

## 2018-10-16 NOTE — Telephone Encounter (Signed)
TCT patient regarding his lab work from 10/15/18.  Spoke with patient and informed him that his iron levels are still low. He confirmed that he is taking his iron tablets 3 x a day 'most of the time'. Advised pt to do his best to be consistently take the iron 3 x a day. He is aware of his upcoming appts.  He is to be transfused on Saturday, 10/18/18 with 2 units of PRBC's

## 2018-10-16 NOTE — Telephone Encounter (Signed)
-----   Message from Ladell Pier, MD sent at 10/15/2018  5:33 PM EST ----- Please call patient, be sure he is taking iron 3  Times a day, we can give iv iron if he is unable to get the ferrous sulfate

## 2018-10-17 ENCOUNTER — Other Ambulatory Visit: Payer: Self-pay | Admitting: *Deleted

## 2018-10-17 DIAGNOSIS — C220 Liver cell carcinoma: Secondary | ICD-10-CM

## 2018-10-17 DIAGNOSIS — D500 Iron deficiency anemia secondary to blood loss (chronic): Secondary | ICD-10-CM

## 2018-10-17 LAB — BASIC METABOLIC PANEL
BUN/Creatinine Ratio: 10 (ref 10–24)
BUN: 10 mg/dL (ref 8–27)
CO2: 22 mmol/L (ref 20–29)
Calcium: 8.8 mg/dL (ref 8.6–10.2)
Chloride: 109 mmol/L — ABNORMAL HIGH (ref 96–106)
Creatinine, Ser: 1.01 mg/dL (ref 0.76–1.27)
GFR calc Af Amer: 84 mL/min/{1.73_m2} (ref >59)
GFR calc non Af Amer: 73 mL/min/{1.73_m2} (ref >59)
Glucose: 108 mg/dL — ABNORMAL HIGH (ref 65–99)
Potassium: 4.4 mmol/L (ref 3.5–5.2)
Sodium: 143 mmol/L (ref 134–144)

## 2018-10-17 LAB — PREPARE RBC (CROSSMATCH)

## 2018-10-18 ENCOUNTER — Inpatient Hospital Stay: Payer: Medicare Other

## 2018-10-18 DIAGNOSIS — E119 Type 2 diabetes mellitus without complications: Secondary | ICD-10-CM | POA: Diagnosis not present

## 2018-10-18 DIAGNOSIS — K7460 Unspecified cirrhosis of liver: Secondary | ICD-10-CM | POA: Diagnosis not present

## 2018-10-18 DIAGNOSIS — D500 Iron deficiency anemia secondary to blood loss (chronic): Secondary | ICD-10-CM

## 2018-10-18 DIAGNOSIS — Z8505 Personal history of malignant neoplasm of liver: Secondary | ICD-10-CM | POA: Diagnosis not present

## 2018-10-18 DIAGNOSIS — C220 Liver cell carcinoma: Secondary | ICD-10-CM

## 2018-10-18 DIAGNOSIS — D61818 Other pancytopenia: Secondary | ICD-10-CM | POA: Diagnosis not present

## 2018-10-18 MED ORDER — SODIUM CHLORIDE 0.9% IV SOLUTION
250.0000 mL | Freq: Once | INTRAVENOUS | Status: DC
  Filled 2018-10-18: qty 250

## 2018-10-18 NOTE — Patient Instructions (Signed)
Blood Transfusion, Adult, Care After This sheet gives you information about how to care for yourself after your procedure. Your doctor may also give you more specific instructions. If you have problems or questions, contact your doctor. Follow these instructions at home:   Take over-the-counter and prescription medicines only as told by your doctor.  Go back to your normal activities as told by your doctor.  Follow instructions from your doctor about how to take care of the area where an IV tube was put into your vein (insertion site). Make sure you: ? Wash your hands with soap and water before you change your bandage (dressing). If there is no soap and water, use hand sanitizer. ? Change your bandage as told by your doctor.  Check your IV insertion site every day for signs of infection. Check for: ? More redness, swelling, or pain. ? More fluid or blood. ? Warmth. ? Pus or a bad smell. Contact a doctor if:  You have more redness, swelling, or pain around the IV insertion site.  You have more fluid or blood coming from the IV insertion site.  Your IV insertion site feels warm to the touch.  You have pus or a bad smell coming from the IV insertion site.  Your pee (urine) turns pink, red, or brown.  You feel weak after doing your normal activities. Get help right away if:  You have signs of a serious allergic or body defense (immune) system reaction, including: ? Itchiness. ? Hives. ? Trouble breathing. ? Anxiety. ? Pain in your chest or lower back. ? Fever, flushing, and chills. ? Fast pulse. ? Rash. ? Watery poop (diarrhea). ? Throwing up (vomiting). ? Dark pee. ? Serious headache. ? Dizziness. ? Stiff neck. ? Yellow color in your face or the white parts of your eyes (jaundice). Summary  After a blood transfusion, return to your normal activities as told by your doctor.  Every day, check for signs of infection where the IV tube was put into your vein.  Some  signs of infection are warm skin, more redness and pain, more fluid or blood, and pus or a bad smell where the needle went in.  Contact your doctor if you feel weak or have any unusual symptoms. This information is not intended to replace advice given to you by your health care provider. Make sure you discuss any questions you have with your health care provider. Document Released: 08/27/2014 Document Revised: 03/30/2016 Document Reviewed: 03/30/2016 Elsevier Interactive Patient Education  2019 Elsevier Inc.  

## 2018-10-19 ENCOUNTER — Encounter: Payer: Self-pay | Admitting: Family Medicine

## 2018-10-20 ENCOUNTER — Telehealth: Payer: Self-pay | Admitting: Family Medicine

## 2018-10-20 DIAGNOSIS — D649 Anemia, unspecified: Secondary | ICD-10-CM | POA: Diagnosis not present

## 2018-10-20 DIAGNOSIS — I10 Essential (primary) hypertension: Secondary | ICD-10-CM | POA: Diagnosis not present

## 2018-10-20 DIAGNOSIS — I951 Orthostatic hypotension: Secondary | ICD-10-CM | POA: Diagnosis not present

## 2018-10-20 DIAGNOSIS — I700 Atherosclerosis of aorta: Secondary | ICD-10-CM | POA: Diagnosis not present

## 2018-10-20 DIAGNOSIS — E119 Type 2 diabetes mellitus without complications: Secondary | ICD-10-CM | POA: Diagnosis not present

## 2018-10-20 LAB — TYPE AND SCREEN
ABO/RH(D): B POS
Antibody Screen: POSITIVE
Unit division: 0
Unit division: 0

## 2018-10-20 LAB — BPAM RBC
Blood Product Expiration Date: 202003212359
Blood Product Expiration Date: 202003262359
ISSUE DATE / TIME: 202002290852
ISSUE DATE / TIME: 202002290852
Unit Type and Rh: 7300
Unit Type and Rh: 7300

## 2018-10-20 NOTE — Telephone Encounter (Unsigned)
Copied from [phone]. Topic: Quick Communication - Home Health Verbal Orders >> Oct 20, 2018 12:34 PM Carolyn Stare wrote: Caller/Agency  Denise with [facility] cal lto say she does think the pt is safe at home by himself and does not think he is taking his medicine   She is requesting a call about the doctor thoughts   Callback Number  [phone]   Requesting  Social Work

## 2018-10-22 DIAGNOSIS — E119 Type 2 diabetes mellitus without complications: Secondary | ICD-10-CM | POA: Diagnosis not present

## 2018-10-22 DIAGNOSIS — I700 Atherosclerosis of aorta: Secondary | ICD-10-CM | POA: Diagnosis not present

## 2018-10-22 DIAGNOSIS — D649 Anemia, unspecified: Secondary | ICD-10-CM | POA: Diagnosis not present

## 2018-10-22 DIAGNOSIS — I10 Essential (primary) hypertension: Secondary | ICD-10-CM | POA: Diagnosis not present

## 2018-10-22 DIAGNOSIS — I951 Orthostatic hypotension: Secondary | ICD-10-CM | POA: Diagnosis not present

## 2018-10-24 DIAGNOSIS — I951 Orthostatic hypotension: Secondary | ICD-10-CM | POA: Diagnosis not present

## 2018-10-24 DIAGNOSIS — E119 Type 2 diabetes mellitus without complications: Secondary | ICD-10-CM | POA: Diagnosis not present

## 2018-10-24 DIAGNOSIS — I700 Atherosclerosis of aorta: Secondary | ICD-10-CM | POA: Diagnosis not present

## 2018-10-24 DIAGNOSIS — I10 Essential (primary) hypertension: Secondary | ICD-10-CM | POA: Diagnosis not present

## 2018-10-24 DIAGNOSIS — D649 Anemia, unspecified: Secondary | ICD-10-CM | POA: Diagnosis not present

## 2018-10-24 NOTE — Telephone Encounter (Signed)
Have attempted to call pt on 3 different occasions with no answer to follow-up about his medication and to see if he can come into the office with Dr. [name] and go over medications. Will try again today.

## 2018-10-24 NOTE — Telephone Encounter (Signed)
I have attempted to called the pt and there was no answer once again.

## 2018-10-25 DIAGNOSIS — I951 Orthostatic hypotension: Secondary | ICD-10-CM | POA: Diagnosis not present

## 2018-10-25 DIAGNOSIS — D649 Anemia, unspecified: Secondary | ICD-10-CM | POA: Diagnosis not present

## 2018-10-25 DIAGNOSIS — I10 Essential (primary) hypertension: Secondary | ICD-10-CM | POA: Diagnosis not present

## 2018-10-25 DIAGNOSIS — E119 Type 2 diabetes mellitus without complications: Secondary | ICD-10-CM | POA: Diagnosis not present

## 2018-10-25 DIAGNOSIS — I700 Atherosclerosis of aorta: Secondary | ICD-10-CM | POA: Diagnosis not present

## 2018-10-27 DIAGNOSIS — I700 Atherosclerosis of aorta: Secondary | ICD-10-CM | POA: Diagnosis not present

## 2018-10-27 DIAGNOSIS — I10 Essential (primary) hypertension: Secondary | ICD-10-CM | POA: Diagnosis not present

## 2018-10-27 DIAGNOSIS — I951 Orthostatic hypotension: Secondary | ICD-10-CM | POA: Diagnosis not present

## 2018-10-27 DIAGNOSIS — E119 Type 2 diabetes mellitus without complications: Secondary | ICD-10-CM | POA: Diagnosis not present

## 2018-10-27 DIAGNOSIS — D649 Anemia, unspecified: Secondary | ICD-10-CM | POA: Diagnosis not present

## 2018-10-28 DIAGNOSIS — I700 Atherosclerosis of aorta: Secondary | ICD-10-CM | POA: Diagnosis not present

## 2018-10-28 DIAGNOSIS — E119 Type 2 diabetes mellitus without complications: Secondary | ICD-10-CM | POA: Diagnosis not present

## 2018-10-28 DIAGNOSIS — I951 Orthostatic hypotension: Secondary | ICD-10-CM | POA: Diagnosis not present

## 2018-10-28 DIAGNOSIS — I10 Essential (primary) hypertension: Secondary | ICD-10-CM | POA: Diagnosis not present

## 2018-10-28 DIAGNOSIS — D649 Anemia, unspecified: Secondary | ICD-10-CM | POA: Diagnosis not present

## 2018-10-29 ENCOUNTER — Other Ambulatory Visit: Payer: Self-pay

## 2018-10-29 ENCOUNTER — Inpatient Hospital Stay: Payer: Medicare Other

## 2018-10-29 ENCOUNTER — Inpatient Hospital Stay: Payer: Medicare Other | Attending: Oncology | Admitting: Nurse Practitioner

## 2018-10-29 ENCOUNTER — Telehealth: Payer: Self-pay | Admitting: Nurse Practitioner

## 2018-10-29 ENCOUNTER — Telehealth: Payer: Self-pay

## 2018-10-29 ENCOUNTER — Encounter: Payer: Self-pay | Admitting: Nurse Practitioner

## 2018-10-29 VITALS — BP 130/76 | HR 81 | Temp 98.4°F | Resp 18 | Ht 69.0 in | Wt 173.0 lb

## 2018-10-29 DIAGNOSIS — D509 Iron deficiency anemia, unspecified: Secondary | ICD-10-CM | POA: Diagnosis not present

## 2018-10-29 DIAGNOSIS — K7460 Unspecified cirrhosis of liver: Secondary | ICD-10-CM | POA: Diagnosis not present

## 2018-10-29 DIAGNOSIS — E119 Type 2 diabetes mellitus without complications: Secondary | ICD-10-CM | POA: Diagnosis not present

## 2018-10-29 DIAGNOSIS — C220 Liver cell carcinoma: Secondary | ICD-10-CM | POA: Diagnosis not present

## 2018-10-29 DIAGNOSIS — D500 Iron deficiency anemia secondary to blood loss (chronic): Secondary | ICD-10-CM

## 2018-10-29 DIAGNOSIS — I8500 Esophageal varices without bleeding: Secondary | ICD-10-CM | POA: Diagnosis not present

## 2018-10-29 LAB — CBC WITH DIFFERENTIAL (CANCER CENTER ONLY)
Abs Immature Granulocytes: 0.01 10*3/uL (ref 0.00–0.07)
Basophils Absolute: 0 10*3/uL (ref 0.0–0.1)
Basophils Relative: 1 %
Eosinophils Absolute: 0 10*3/uL (ref 0.0–0.5)
Eosinophils Relative: 1 %
HCT: 31.6 % — ABNORMAL LOW (ref 39.0–52.0)
Hemoglobin: 9.9 g/dL — ABNORMAL LOW (ref 13.0–17.0)
Immature Granulocytes: 0 %
Lymphocytes Relative: 18 %
Lymphs Abs: 0.6 10*3/uL — ABNORMAL LOW (ref 0.7–4.0)
MCH: 30.7 pg (ref 26.0–34.0)
MCHC: 31.3 g/dL (ref 30.0–36.0)
MCV: 98.1 fL (ref 80.0–100.0)
Monocytes Absolute: 0.2 10*3/uL (ref 0.1–1.0)
Monocytes Relative: 7 %
Neutro Abs: 2.4 10*3/uL (ref 1.7–7.7)
Neutrophils Relative %: 73 %
Platelet Count: 70 10*3/uL — ABNORMAL LOW (ref 150–400)
RBC: 3.22 MIL/uL — ABNORMAL LOW (ref 4.22–5.81)
RDW: 16.5 % — ABNORMAL HIGH (ref 11.5–15.5)
WBC Count: 3.3 10*3/uL — ABNORMAL LOW (ref 4.0–10.5)
nRBC: 0 % (ref 0.0–0.2)

## 2018-10-29 NOTE — Telephone Encounter (Signed)
Scheduled appt per 3/11 los. ° °Printed calendar and avs. °

## 2018-10-29 NOTE — Progress Notes (Signed)
[name] OFFICE PROGRESS NOTE   Diagnosis: Cirrhosis, pancytopenia  INTERVAL HISTORY:   [name] returns as scheduled.  He was transfused 2 units of blood 10/18/2018.  He has occasional nosebleeds.  No other bleeding.  He is taking oral iron 3 times a day.  His appetite is better.  He denies abdominal pain.  Objective:  Vital signs in last 24 hours:  Blood pressure 130/76, pulse 81, temperature 98.4 F (36.9 C), temperature source Oral, resp. rate 18, height 5\' 9"  (1.753 m), weight 173 lb (78.5 kg), SpO2 100 %.    Resp: Lungs clear bilaterally. Cardio: Regular rate and rhythm. GI: Abdomen soft and nontender.  No hepatomegaly.  No splenomegaly. Vascular: No leg edema.   Lab Results:  Lab Results  Component Value Date   WBC 3.3 (L) 10/29/2018   HGB 9.9 (L) 10/29/2018   HCT 31.6 (L) 10/29/2018   MCV 98.1 10/29/2018   PLT 70 (L) 10/29/2018   NEUTROABS 2.4 10/29/2018    Imaging:  No results found.  Medications: I have reviewed the patient's current medications.  Assessment/Plan: 1. Hepatocellular carcinoma, stage I (T1 NX) status post partial left hepatectomy 03/02/2013.  08/31/2013 AFP 2.4.   CT abdomen 08/31/2013 with postoperative changes of partial hepatectomy with small postoperative fluid collection along the resection margin. No definite signs to suggest residual or locally recurrent disease. Several borderline enlarged and minimally enlarged lymph nodes superior to the liver in the juxtapericardiac fat of the lower anterior mediastinum with the largest measuring 11 mm slightly increased compared to the prior study 01/15/2013. Several prominent non-pathologically enlarged upper abdominal ligament lymph nodes similar to the prior study. Small esophageal varices.   CT abdomen 03/03/2012 without evidence of recurrent hepatocellular carcinoma, stable right lung base nodule  CT the abdomen and pelvis 09/06/2014 without evidence of recurrent  hepatocellular carcinoma, slight enlargement of pre-cardiac lymph nodes  CT abdomen/pelvis 09/07/2015 with no findings for recurrent tumor or metastatic disease. Stable small 3 mm right middle lobe pulmonary nodule since 2014. Epicardial lymph node measured 11 mm, previously 13 mm. Stable cirrhotic changes. Fairly significant esophageal varices.  CT 09/03/2016-negative for recurrent hepatocellular carcinoma, changes of cirrhosis with varices  CT 10/15/2017- no evidence of recurrent hepatocellular carcinoma, cirrhosis/varices 2. Cirrhosis. 3. Hepatitis B core antibody positive. 4. Diabetes. 5. Severe microcytic anemia-likely iron deficiency anemia-progressive 06/03/2017. Stool Hemoccult positive 3 06/05/2017.  6. Colonoscopy 12/27/2016-external and internal hemorrhoids. Diverticulosis in the sigmoid, descending and transverse colon. One medium polyp in the distal descending colon. 3 small polyps in the transverse colon. 2 diminutive polyps in the rectum and the proximal ascending colon. Medium-sized lipoma transverse colon. Small lipoma mid ascending colon. Multiplenonbleeding colonic angiodysplastic lesions. Pathology-tubular adenomas, hyperplastic polyps. 7. Pancytopenia secondary to cirrhosis and GI bleeding 8. Laparoscopic cholecystectomy 01/28/2018 9. Decreased iron stores 10/15/2018; transfused 2 units of blood 10/18/2018     Disposition: [name] appears stable.  Hemoglobin is better following the recent blood transfusion.  He had decreased iron stores on 10/15/2018.  We discussed IV iron.  He understands there is a risk of a severe allergic reaction.  He agrees to proceed.  He will return for [facility] 11/04/2018 and 11/11/2018.  We will contact Dr. Perley [name] office for the most recent endoscopy report.  He will return for lab and a follow-up visit in approximately 1 month.  He will contact the office in the interim with any problems.  Plan reviewed with Dr. Benay [name].    Ned Card  ANP/GNP-BC  10/29/2018  12:50 PM

## 2018-10-29 NOTE — Telephone Encounter (Signed)
TC per Lattie Haw called Dr. Perley Jain office and requested recent endoscopy report for pt. Fax received of results and placed on Dr. Gearldine Shown desk.

## 2018-11-03 ENCOUNTER — Ambulatory Visit: Payer: Medicare Other | Admitting: Neurology

## 2018-11-03 ENCOUNTER — Telehealth: Payer: Self-pay | Admitting: Neurology

## 2018-11-03 DIAGNOSIS — I700 Atherosclerosis of aorta: Secondary | ICD-10-CM | POA: Diagnosis not present

## 2018-11-03 DIAGNOSIS — I10 Essential (primary) hypertension: Secondary | ICD-10-CM | POA: Diagnosis not present

## 2018-11-03 DIAGNOSIS — D649 Anemia, unspecified: Secondary | ICD-10-CM | POA: Diagnosis not present

## 2018-11-03 DIAGNOSIS — E119 Type 2 diabetes mellitus without complications: Secondary | ICD-10-CM | POA: Diagnosis not present

## 2018-11-03 DIAGNOSIS — I951 Orthostatic hypotension: Secondary | ICD-10-CM | POA: Diagnosis not present

## 2018-11-03 NOTE — Telephone Encounter (Signed)
Called the patient to discuss when to get him rescheduled. No answer and was unable to leave a message. Patient is higher risk for compromised immune system. Dr Dohmeier recommends having the patient push apt out for 3 mths. Can be with NP or MD first available. (possibly can use April 7th slot if things have cleared by then)

## 2018-11-04 ENCOUNTER — Inpatient Hospital Stay: Payer: Medicare Other

## 2018-11-04 ENCOUNTER — Other Ambulatory Visit: Payer: Self-pay

## 2018-11-04 ENCOUNTER — Other Ambulatory Visit: Payer: Self-pay | Admitting: Oncology

## 2018-11-04 VITALS — BP 123/66 | HR 74 | Temp 98.6°F | Resp 18

## 2018-11-04 DIAGNOSIS — D509 Iron deficiency anemia, unspecified: Secondary | ICD-10-CM | POA: Insufficient documentation

## 2018-11-04 DIAGNOSIS — E119 Type 2 diabetes mellitus without complications: Secondary | ICD-10-CM | POA: Diagnosis not present

## 2018-11-04 DIAGNOSIS — K7460 Unspecified cirrhosis of liver: Secondary | ICD-10-CM | POA: Diagnosis not present

## 2018-11-04 DIAGNOSIS — I8500 Esophageal varices without bleeding: Secondary | ICD-10-CM | POA: Diagnosis not present

## 2018-11-04 DIAGNOSIS — D500 Iron deficiency anemia secondary to blood loss (chronic): Secondary | ICD-10-CM

## 2018-11-04 DIAGNOSIS — C220 Liver cell carcinoma: Secondary | ICD-10-CM | POA: Diagnosis not present

## 2018-11-04 MED ORDER — SODIUM CHLORIDE 0.9 % IV SOLN
Freq: Once | INTRAVENOUS | Status: AC
  Administered 2018-11-04: 08:00:00 via INTRAVENOUS
  Filled 2018-11-04: qty 250

## 2018-11-04 MED ORDER — SODIUM CHLORIDE 0.9 % IV SOLN
510.0000 mg | Freq: Once | INTRAVENOUS | Status: AC
  Administered 2018-11-04: 510 mg via INTRAVENOUS
  Filled 2018-11-04: qty 17

## 2018-11-04 NOTE — Patient Instructions (Signed)

## 2018-11-05 DIAGNOSIS — I10 Essential (primary) hypertension: Secondary | ICD-10-CM | POA: Diagnosis not present

## 2018-11-05 DIAGNOSIS — D649 Anemia, unspecified: Secondary | ICD-10-CM | POA: Diagnosis not present

## 2018-11-05 DIAGNOSIS — E119 Type 2 diabetes mellitus without complications: Secondary | ICD-10-CM | POA: Diagnosis not present

## 2018-11-05 DIAGNOSIS — I951 Orthostatic hypotension: Secondary | ICD-10-CM | POA: Diagnosis not present

## 2018-11-05 DIAGNOSIS — I700 Atherosclerosis of aorta: Secondary | ICD-10-CM | POA: Diagnosis not present

## 2018-11-06 DIAGNOSIS — I951 Orthostatic hypotension: Secondary | ICD-10-CM | POA: Diagnosis not present

## 2018-11-06 DIAGNOSIS — E119 Type 2 diabetes mellitus without complications: Secondary | ICD-10-CM | POA: Diagnosis not present

## 2018-11-06 DIAGNOSIS — I10 Essential (primary) hypertension: Secondary | ICD-10-CM | POA: Diagnosis not present

## 2018-11-06 DIAGNOSIS — D649 Anemia, unspecified: Secondary | ICD-10-CM | POA: Diagnosis not present

## 2018-11-06 DIAGNOSIS — I700 Atherosclerosis of aorta: Secondary | ICD-10-CM | POA: Diagnosis not present

## 2018-11-10 DIAGNOSIS — E119 Type 2 diabetes mellitus without complications: Secondary | ICD-10-CM | POA: Diagnosis not present

## 2018-11-10 DIAGNOSIS — I700 Atherosclerosis of aorta: Secondary | ICD-10-CM | POA: Diagnosis not present

## 2018-11-10 DIAGNOSIS — I10 Essential (primary) hypertension: Secondary | ICD-10-CM | POA: Diagnosis not present

## 2018-11-10 DIAGNOSIS — D649 Anemia, unspecified: Secondary | ICD-10-CM | POA: Diagnosis not present

## 2018-11-10 DIAGNOSIS — I951 Orthostatic hypotension: Secondary | ICD-10-CM | POA: Diagnosis not present

## 2018-11-11 ENCOUNTER — Other Ambulatory Visit: Payer: Self-pay

## 2018-11-11 ENCOUNTER — Inpatient Hospital Stay: Payer: Medicare Other

## 2018-11-11 VITALS — BP 135/70 | HR 69 | Temp 98.0°F | Resp 17

## 2018-11-11 DIAGNOSIS — D500 Iron deficiency anemia secondary to blood loss (chronic): Secondary | ICD-10-CM

## 2018-11-11 DIAGNOSIS — E119 Type 2 diabetes mellitus without complications: Secondary | ICD-10-CM | POA: Diagnosis not present

## 2018-11-11 DIAGNOSIS — D509 Iron deficiency anemia, unspecified: Secondary | ICD-10-CM | POA: Diagnosis not present

## 2018-11-11 DIAGNOSIS — I8500 Esophageal varices without bleeding: Secondary | ICD-10-CM | POA: Diagnosis not present

## 2018-11-11 DIAGNOSIS — C220 Liver cell carcinoma: Secondary | ICD-10-CM | POA: Diagnosis not present

## 2018-11-11 DIAGNOSIS — K7460 Unspecified cirrhosis of liver: Secondary | ICD-10-CM | POA: Diagnosis not present

## 2018-11-11 MED ORDER — SODIUM CHLORIDE 0.9 % IV SOLN
Freq: Once | INTRAVENOUS | Status: AC
  Administered 2018-11-11: 09:00:00 via INTRAVENOUS
  Filled 2018-11-11: qty 250

## 2018-11-11 MED ORDER — SODIUM CHLORIDE 0.9 % IV SOLN
510.0000 mg | Freq: Once | INTRAVENOUS | Status: AC
  Administered 2018-11-11: 510 mg via INTRAVENOUS
  Filled 2018-11-11: qty 17

## 2018-11-11 NOTE — Patient Instructions (Signed)

## 2018-11-13 ENCOUNTER — Telehealth: Payer: Self-pay | Admitting: Neurology

## 2018-11-13 ENCOUNTER — Other Ambulatory Visit: Payer: Self-pay

## 2018-11-13 ENCOUNTER — Telehealth (INDEPENDENT_AMBULATORY_CARE_PROVIDER_SITE_OTHER): Payer: Medicare Other | Admitting: Family Medicine

## 2018-11-13 ENCOUNTER — Other Ambulatory Visit: Payer: Self-pay | Admitting: Neurology

## 2018-11-13 DIAGNOSIS — D649 Anemia, unspecified: Secondary | ICD-10-CM | POA: Diagnosis not present

## 2018-11-13 DIAGNOSIS — G47411 Narcolepsy with cataplexy: Secondary | ICD-10-CM

## 2018-11-13 DIAGNOSIS — R5383 Other fatigue: Secondary | ICD-10-CM

## 2018-11-13 DIAGNOSIS — Z8781 Personal history of (healed) traumatic fracture: Secondary | ICD-10-CM | POA: Diagnosis not present

## 2018-11-13 DIAGNOSIS — Z9181 History of falling: Secondary | ICD-10-CM | POA: Diagnosis not present

## 2018-11-13 MED ORDER — AMPHETAMINE-DEXTROAMPHET ER 30 MG PO CP24: ORAL_CAPSULE | ORAL | 0 refills | Status: DC

## 2018-11-13 NOTE — Patient Instructions (Signed)
° ° ° °  If you have lab work done today you will be contacted with your lab results within the next 2 weeks.  If you have not heard from us then please contact us. The fastest way to get your results is to register for My Chart. ° ° °IF you received an x-ray today, you will receive an invoice from [facility]. Please contact [facility] at [phone] with questions or concerns regarding your invoice.  ° °IF you received labwork today, you will receive an invoice from [facility]. Please contact [facility] at [phone] with questions or concerns regarding your invoice.  ° °Our billing staff will not be able to assist you with questions regarding bills from these companies. ° °You will be contacted with the lab results as soon as they are available. The fastest way to get your results is to activate your My Chart account. Instructions are located on the last page of this paperwork. If you have not heard from us regarding the results in 2 weeks, please contact this office. °  ° ° ° °

## 2018-11-13 NOTE — Progress Notes (Signed)
Virtual Visit via Telephone Note  I connected with [name] on 11/13/18 at 9:42 AM  by telephone and verified that I am speaking with the correct person using two identifiers.   I discussed the limitations, risks, security and privacy concerns of performing an evaluation and management service by telephone and the availability of in person appointments. I also discussed with the patient that there may be a patient responsible charge related to this service. The patient expressed understanding and agreed to proceed, consent obtained  CC: follow up- history of falls, anemia, constipation  History of Present Illness:  History of falls: See notes from 1 month ago.  Chronic anemia, some episodes of general fatigue, home health eval placed and now has assistance at home including physical therapy.  Recommended using a cane to lessen risk of falls and fall prevention handout given for home. Doing better. Walking with cane for better. Home assistance program ended Friday - working on getting other resources. Eating ok, no difficulty with getting food.  On recent falls.    Anemia: Followed by hematology/oncology, history of hepatocellular carcinoma with left hepatectomy in July 2014.  CT 10/15/2017 no evidence of recurrent hepatocellular carcinoma. - office visit March 11 with Ned Card, NP.  Had iron infusion on March 17th, prior blood transfusion -2 units packed red blood cells February 29th.  Lab Results  Component Value Date   WBC 3.3 (L) 10/29/2018   HGB 9.9 (L) 10/29/2018   HCT 31.6 (L) 10/29/2018   MCV 98.1 10/29/2018   PLT 70 (L) 10/29/2018  energy level is better - makes him feel better. Tired at times, but less than in past.   Narcolepsy: Followed by [facility] neurology, Dr. Brett Fairy.  Takes Adderall XR 30 mg daily.  Also on Aricept 10 mg daily for memory loss. Plans on calling pharmacy and then their office this morning for refill.   R sided pain: Right T10 rib  fracture in October 2019.  Same area as in past. Pain is improving. Less pains, not using otc meds.    Patient Active Problem List   Diagnosis Date Noted  . Iron deficiency anemia 11/04/2018  . Orthostatic hypotension 08/06/2018  . Hypertension 08/06/2018  . Hyperlipidemia 08/06/2018  . Pancytopenia ([place]) 08/06/2018  . 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 Insufficient treatment with nasal CPAP 05/05/2018  . 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 Irregular heart beat 01/28/2018  . Noncompliance with CPAP treatment 08/14/2017  . Excessive daytime sleepiness 04/09/2017  . Snoring 04/09/2017  . Memory impairment of gradual onset 04/09/2017  . Aortic atherosclerosis ([place]) 11/27/2016  . 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 Altered mental status 03/05/2013  . Respiratory failure, post-operative ([place]) 03/02/2013  . Hepatitis B 02/24/2013  . Cancer, hepatocellular ([place]) 02/10/2013  . Liver tumor-bleeding 01/15/2013  . Epidermal cyst 06/18/2012  . OSA (obstructive sleep apnea) 03/13/2012  . Abnormal leg movement 12/12/2011  . Blood pressure elevated 09/06/2011  . Diabetes mellitus ([place]) 09/06/2011  . 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 BPH (benign prostatic hyperplasia) 09/06/2011  . Diverticula of colon 09/06/2011   Past Medical History:  Diagnosis Date  . Cancer [facility])    hepatocellular cancer   . Diabetes mellitus without complication ([place])   . Dyspnea   . Hepatitis B   . Hyperlipidemia   . Hypertension   . Irregular heart beat 01/28/2018  . Narcolepsy    per office visit  note of 08/2011   . Sleep apnea    cpap   Past Surgical History:  Procedure Laterality Date  . CHOLECYSTECTOMY N/A 01/28/2018   Procedure: LAPAROSCOPIC CHOLECYSTECTOMY;  Surgeon: [name], MD;  Location: [facility];  Service: General;  Laterality: N/A;  . COLONOSCOPY WITH PROPOFOL N/A 08/27/2017   Procedure: COLONOSCOPY  WITH PROPOFOL;  Surgeon: Clarene Essex, MD;  Location: [facility];  Service: Endoscopy;  Laterality: N/A;  . HOT HEMOSTASIS N/A 08/27/2017   Procedure: HOT HEMOSTASIS (ARGON PLASMA COAGULATION/BICAP);  Surgeon: Clarene Essex, MD;  [facility];  Service: Endoscopy;  Laterality: N/A;  . LAPAROSCOPIC CHOLECYSTECTOMY  01/28/2018  . LAPAROSCOPY  03/02/2013   Procedure: LAPAROSCOPY DIAGNOSTIC;  Surgeon: [name], MD;  Location: [facility];  Service: General;;  . LIVER ULTRASOUND  03/02/2013   Procedure: LIVER ULTRASOUND;  Surgeon: [name], MD;  [facility];  Service: General;;  . OPEN PARTIAL HEPATECTOMY   03/02/2013   Procedure: OPEN PARTIAL HEPATECTOMY [83];  Surgeon: [name], MD;  Location: [facility];  Service: General;;  DX LAPAROSCOPY, INTRAOPERATIVE LIVER ULTRASOUND, OPEN PARTIAL HEPATECTOMY  . pinched nerve in back     Allergies  Allergen Reactions  . Ace Inhibitors Swelling and Other (See Comments)    Angioedema - face.    Prior to Admission medications   Medication Sig Start Date End Date Taking? Authorizing Provider  ACCU-CHEK SOFTCLIX LANCETS lancets Once per day testing. 08/05/17  Yes Wendie Agreste, MD  alfuzosin (UROXATRAL) 10 MG 24 hr tablet Take 1 tablet (10 mg total) by mouth at bedtime. 06/11/18  Yes Wendie Agreste, MD  amphetamine-dextroamphetamine (ADDERALL XR) 30 MG 24 hr capsule Take daily by mouth with water 30 minutes before breakfast. 05/05/18  Yes Dohmeier, Asencion Partridge, MD  blood glucose meter kit and supplies Dispense based on patient and insurance preference. Use up to four times daily as directed. (FOR ICD-9 250.00, 250.01). 04/25/17  Yes Wendie Agreste, MD  donepezil (ARICEPT) 10 MG tablet Take 1 tablet (10 mg total) by mouth at bedtime. 10/15/18  Yes Dohmeier, Asencion Partridge, MD  feeding supplement, ENSURE ENLIVE, (ENSURE ENLIVE) LIQD Take 237 mLs by mouth 2 (two) times daily between meals. 08/08/18  Yes Emokpae, Courage, MD  ferrous sulfate (KP FERROUS  SULFATE) 325 (65 FE) MG tablet Take 1 tablet (325 mg total) by mouth 3 (three) times daily with meals. 08/08/18  Yes Emokpae, Courage, MD  glucose blood (ACCU-CHEK AVIVA PLUS) test strip 1 each by Other route as needed for other. Use as instructed - once per day testing for now. 07/30/18  Yes Wendie Agreste, MD  Multiple Vitamin (MULTIVITAMIN WITH MINERALS) TABS tablet Take 1 tablet by mouth daily. 08/09/18  Yes Emokpae, Courage, MD  pravastatin (PRAVACHOL) 20 MG tablet Take 1 tablet (20 mg total) by mouth at bedtime. 06/11/18  Yes Wendie Agreste, MD  psyllium (METAMUCIL) 58.6 % powder Take 1 packet by mouth daily. Member purchased over-the-counter 09/22/18  Yes [provider]  sertraline (ZOLOFT) 25 MG tablet Take 1 tablet (25 mg total) by mouth daily. 07/30/18  Yes Wendie Agreste, MD  spironolactone (ALDACTONE) 25 MG tablet Take 1 tablet (25 mg total) by mouth daily. 08/08/18  Yes Roxan Hockey, MD   Social History   Socioeconomic History  . Marital status: Divorced    Spouse name: Not on file  . Number of children: 2  . Years of education: 15+  . Highest education level: Bachelor's degree (e.g., BA, AB,  BS)  Occupational History  . Not on file  Social Needs  . Financial resource strain: Very hard  . Food insecurity:    Worry: Sometimes true    Inability: Sometimes true  . Transportation needs:    Medical: No    Non-medical: No  Tobacco Use  . Smoking status: Former Smoker    Years: 5.00    Types: Cigarettes  . Smokeless tobacco: Never Used  Substance and Sexual Activity  . Alcohol use: No    Frequency: Never  . Drug use: No  . Sexual activity: Not Currently  Lifestyle  . Physical activity:    Days per week: 0 days    Minutes per session: 0 min  . Stress: Not at all  Relationships  . Social connections:    Talks on phone: Once a week    Gets together: Once a week    Attends religious service: 1 to 4 times per year    Active member of club or  organization: No    Attends meetings of clubs or organizations: Never    Relationship status: Divorced  . Intimate partner violence:    Fear of current or ex partner: Not on file    Emotionally abused: Not on file    Physically abused: Not on file    Forced sexual activity: Not on file  Other Topics Concern  . Not on file  Social History Narrative   Patient is single and lives alone- became widower when 1 child was 22 year old, divorced 2nd marriage   Has #2 grown children, not in area or involved in his life   Patient is retired.   Patient has a college education.   Patient is right-handed.   Patient drinks maybe two sodas daily.     Observations/Objective: Speaking normally on phone, following instructions and discussion without difficulty.  Denies any acute distress  Assessment and Plan: History of falling  -Denies recent falls.  Now using assistive device with cane, and has undergone some physical therapy, home assistance.  Continue fall precautions, use of cane, call if worsening fatigue.  Energy level is likely improved with improved anemia.  Narcolepsy with cataplexy  -Unknown medication, but that is been prescribed, monitored by neurology.  Phone number provided initially for his pharmacy to call for refill and for neurologist if needed  Anemia, unspecified type Fatigue, unspecified type  -Improved after transfusion and iron transfusion.  Followed by hematology.  Advised to call or RTC precautions if increasing fatigue or new symptoms  History of rib fracture  -Pain in same area as prior rib fracture, improved, not requiring any over-the-counter treatments.  RTC precautions if worsening  Follow Up Instructions: 2 months unless any worsening fatigue, recurrence of falling, worsening flank/side pain, or any other new symptoms.  Understanding expressed.   I discussed the assessment and treatment plan with the patient. The patient was provided an opportunity to ask  questions and all were answered. The patient agreed with the plan and demonstrated an understanding of the instructions.   The patient was advised to call back or seek an in-person evaluation if the symptoms worsen or if the condition fails to improve as anticipated.  I provided 11 minutes of non-face-to-face time during this encounter.  Signed,   [name], MD [facility].  11/13/18 9:55 AM

## 2018-11-13 NOTE — Telephone Encounter (Signed)
I have routed this request to Dr Dohmeier for review. The pt is due for the medication and [place] registry was verified.  

## 2018-11-13 NOTE — Telephone Encounter (Signed)
Pt is needing a refill on his amphetamine-dextroamphetamine (ADDERALL XR) 30 MG 24 hr capsule sent to [facility] in [facility]

## 2018-11-13 NOTE — Progress Notes (Signed)
Patient stated he is still having pain in right abd area off/on since he broke his ribs on that side. No pain yesterday nor today just on occ. Patient do not have any other issus at this time except down to 2 pills left on his adderall. So he will need a refill on this med.

## 2018-11-14 ENCOUNTER — Ambulatory Visit: Payer: Medicare Other

## 2018-11-14 ENCOUNTER — Other Ambulatory Visit: Payer: Self-pay

## 2018-11-14 NOTE — Patient Outreach (Signed)
[facility]) Care Management  11/14/2018  NARCISO STOUTENBURG 17-Oct-1945 [id number]  Unsuccessful Outreach x1  Called member at preferred number for monthly outreach call; however, no answer . Unable to leave HIPPA compliant voicemail.   Will return call to member within 3-4 business days.  [name], RN-BSN  [facility] Care Management  Community Care Management Coordinator  [phone] [email or web address]

## 2018-11-19 ENCOUNTER — Other Ambulatory Visit: Payer: Self-pay

## 2018-11-19 NOTE — Patient Outreach (Signed)
[facility]) Care Management  11/19/2018  LELAN CUSH 1945/11/25 [id number]  Unsuccessful Outreach x2  Called member at preferred number for monthly outreach call; however, no answer . Unable to leave HIPPA compliant voicemail.   Will return call to member within 3-4 business days. Will send Unsuccessful Outreach Letter.  [name], RN-BSN  [facility] Care Management  Community Care Management Coordinator  [phone] [email or web address]

## 2018-11-24 ENCOUNTER — Other Ambulatory Visit: Payer: Self-pay

## 2018-11-24 NOTE — Patient Outreach (Signed)
[facility]) Care Management  11/24/2018  WILKES POTVIN [birth year] [id number]   Unsuccessful Outreach x3  Called member at preferred number for monthly outreach call; however, no answer . Unable to leave HIPPA compliant voicemail.   RN CM following for history of falls and anemia.  Per member's PCP notation on 11-13-2018: Falls: Member denies recent falls and recommended using ambulatory aids for ambulation: cane.  "Anemia: Followed by hematology/oncology, history of hepatocellular carcinoma with left hepatectomy in July 2014.  CT 10/15/2017 no evidence of recurrent hepatocellular carcinoma. - office visit March 11 with Ned Card, NP.  Had iron infusion on March 17th, prior blood transfusion -2 units packed red blood cells February 29th. energy level is better - makes him feel better. Tired at times, but less than in past."  HGB on 10-29-2018 is 9.9 from 8.1. On 10-15-2018 Ferritin 22.   Food: Member voiced some insecurities per MD notation.  RN CM with 3 unsuccessful outreach attempts. Will place member on hold per work flow. Will send SW referral for meal assistance.  [name], RN-BSN  [facility] Care Management  Community Care Management Coordinator  [phone] [email or web address]

## 2018-11-25 ENCOUNTER — Other Ambulatory Visit: Payer: Self-pay

## 2018-11-25 NOTE — Patient Outreach (Signed)
[facility]) Care Management  11/25/2018  CLEDIS SOHN 1945-11-14 [id number]   Successful outreach to patient today.  Patient reports difficulty with food resources especially during this time of COVID-19.  BSW agreed to submit referral for patient for meal delivery program with [facility]. Referral was submitted and receipt was confirmed.  BSW called patient back to inform him that he will receive seven days worth of meals for the next four Fridays. BSW instructed patient to remove meals from delivery containers, place in personal containers, discard delivery containers, and thoroughly was hands.  BSW will follow up with him next week to ensure receipt of meals.    [name], BSW Social Worker [phone]

## 2018-11-28 ENCOUNTER — Other Ambulatory Visit: Payer: Self-pay | Admitting: Nurse Practitioner

## 2018-11-28 DIAGNOSIS — C220 Liver cell carcinoma: Secondary | ICD-10-CM

## 2018-11-28 DIAGNOSIS — D500 Iron deficiency anemia secondary to blood loss (chronic): Secondary | ICD-10-CM

## 2018-12-01 ENCOUNTER — Other Ambulatory Visit: Payer: Self-pay

## 2018-12-01 NOTE — Patient Outreach (Signed)
[facility]) Care Management  12/01/2018  WILLIAMSON CAVANAH Aug 28, 1945 [id number]   Successful outreach to patient to confirm receipt of meals from [place].  Patient confirmed that he received seven meals on Friday, 4/10.20.  BSW reminded him that he will receive deliveries on the next three Fridays.  BSW will follow up with him within the next three weeks to determine if meal delivery should be extended.   [name], BSW Social Worker [phone]

## 2018-12-02 ENCOUNTER — Inpatient Hospital Stay: Payer: Medicare Other | Attending: Oncology

## 2018-12-02 ENCOUNTER — Other Ambulatory Visit: Payer: Self-pay | Admitting: Nurse Practitioner

## 2018-12-02 ENCOUNTER — Other Ambulatory Visit: Payer: Self-pay

## 2018-12-02 ENCOUNTER — Telehealth: Payer: Self-pay | Admitting: *Deleted

## 2018-12-02 ENCOUNTER — Inpatient Hospital Stay: Payer: Medicare Other | Admitting: Nurse Practitioner

## 2018-12-02 DIAGNOSIS — D500 Iron deficiency anemia secondary to blood loss (chronic): Secondary | ICD-10-CM | POA: Diagnosis not present

## 2018-12-02 DIAGNOSIS — C220 Liver cell carcinoma: Secondary | ICD-10-CM

## 2018-12-02 LAB — CBC WITH DIFFERENTIAL (CANCER CENTER ONLY)
Abs Immature Granulocytes: 0.01 10*3/uL (ref 0.00–0.07)
Basophils Absolute: 0 10*3/uL (ref 0.0–0.1)
Basophils Relative: 1 %
Eosinophils Absolute: 0 10*3/uL (ref 0.0–0.5)
Eosinophils Relative: 1 %
HCT: 29.9 % — ABNORMAL LOW (ref 39.0–52.0)
Hemoglobin: 9.4 g/dL — ABNORMAL LOW (ref 13.0–17.0)
Immature Granulocytes: 0 %
Lymphocytes Relative: 19 %
Lymphs Abs: 0.6 10*3/uL — ABNORMAL LOW (ref 0.7–4.0)
MCH: 31.9 pg (ref 26.0–34.0)
MCHC: 31.4 g/dL (ref 30.0–36.0)
MCV: 101.4 fL — ABNORMAL HIGH (ref 80.0–100.0)
Monocytes Absolute: 0.3 10*3/uL (ref 0.1–1.0)
Monocytes Relative: 8 %
Neutro Abs: 2.2 10*3/uL (ref 1.7–7.7)
Neutrophils Relative %: 71 %
Platelet Count: 84 10*3/uL — ABNORMAL LOW (ref 150–400)
RBC: 2.95 MIL/uL — ABNORMAL LOW (ref 4.22–5.81)
RDW: 15.3 % (ref 11.5–15.5)
WBC Count: 3.1 10*3/uL — ABNORMAL LOW (ref 4.0–10.5)
nRBC: 0 % (ref 0.0–0.2)

## 2018-12-02 LAB — FERRITIN: Ferritin: 124 ng/mL (ref 24–336)

## 2018-12-02 NOTE — Telephone Encounter (Signed)
Called, spoke with the patient regarding his appts for May and June. Appts scheduled and gave the patient the dates/times. Patient requested his lab results from today, forwarded the message to the desk RN. Explained that the nurse would call him back.

## 2018-12-16 ENCOUNTER — Other Ambulatory Visit: Payer: Self-pay

## 2018-12-16 NOTE — Patient Outreach (Signed)
[facility]) Care Management  12/16/2018  COLEBY YETT 1946/07/01 [id number]   Follow up call to patient regarding meal delivery through [facility] informed patient that he will continue to receive meals until further notice.  Because program was created in response to virus, the end date is undetermined at this point.  BSW will follow up with patient as we approach the end of the meal delivery to inform him.   [name], BSW Social Worker [phone]

## 2018-12-17 ENCOUNTER — Ambulatory Visit: Payer: Medicare Other

## 2018-12-29 ENCOUNTER — Other Ambulatory Visit: Payer: Self-pay

## 2018-12-29 NOTE — Patient Outreach (Signed)
[name] Care Management  12/29/2018  [name] 01/14/1946 [id number]   Case Closure according to workflow due to inability to maintain contact with member. Discipline Closure Letter sent to PCP. [facility] SW made aware and SW continues to assist member with meals. No other disciplines involved at this time.   [name], RN-BSN  [facility] Care Management  Community Care Management Coordinator  [phone] [email or web address]

## 2019-01-01 ENCOUNTER — Other Ambulatory Visit: Payer: Self-pay

## 2019-01-01 ENCOUNTER — Inpatient Hospital Stay: Payer: Medicare Other | Attending: Oncology

## 2019-01-01 ENCOUNTER — Ambulatory Visit: Payer: Medicare Other | Admitting: Nurse Practitioner

## 2019-01-01 DIAGNOSIS — C220 Liver cell carcinoma: Secondary | ICD-10-CM | POA: Diagnosis not present

## 2019-01-01 DIAGNOSIS — D500 Iron deficiency anemia secondary to blood loss (chronic): Secondary | ICD-10-CM

## 2019-01-01 LAB — CBC WITH DIFFERENTIAL (CANCER CENTER ONLY)
Abs Immature Granulocytes: 0.01 10*3/uL (ref 0.00–0.07)
Basophils Absolute: 0 10*3/uL (ref 0.0–0.1)
Basophils Relative: 1 %
Eosinophils Absolute: 0.1 10*3/uL (ref 0.0–0.5)
Eosinophils Relative: 2 %
HCT: 29.9 % — ABNORMAL LOW (ref 39.0–52.0)
Hemoglobin: 9.4 g/dL — ABNORMAL LOW (ref 13.0–17.0)
Immature Granulocytes: 0 %
Lymphocytes Relative: 23 %
Lymphs Abs: 0.7 10*3/uL (ref 0.7–4.0)
MCH: 31.3 pg (ref 26.0–34.0)
MCHC: 31.4 g/dL (ref 30.0–36.0)
MCV: 99.7 fL (ref 80.0–100.0)
Monocytes Absolute: 0.4 10*3/uL (ref 0.1–1.0)
Monocytes Relative: 12 %
Neutro Abs: 1.9 10*3/uL (ref 1.7–7.7)
Neutrophils Relative %: 62 %
Platelet Count: 110 10*3/uL — ABNORMAL LOW (ref 150–400)
RBC: 3 MIL/uL — ABNORMAL LOW (ref 4.22–5.81)
RDW: 13.9 % (ref 11.5–15.5)
WBC Count: 3.1 10*3/uL — ABNORMAL LOW (ref 4.0–10.5)
nRBC: 0 % (ref 0.0–0.2)

## 2019-01-01 LAB — FERRITIN: Ferritin: 33 ng/mL (ref 24–336)

## 2019-01-02 LAB — AFP TUMOR MARKER: AFP, Serum, Tumor Marker: 2 ng/mL (ref 0.0–8.3)

## 2019-01-05 ENCOUNTER — Other Ambulatory Visit: Payer: Self-pay

## 2019-01-05 NOTE — Patient Outreach (Signed)
[facility]) Care Management  01/05/2019  [name] 01-05-1946 [id number]   Patient linked to [place] for meal delivery.  No other social work needs identified.  BSW closing case.  [name], BSW Social Worker [phone]

## 2019-01-16 DIAGNOSIS — H2513 Age-related nuclear cataract, bilateral: Secondary | ICD-10-CM | POA: Diagnosis not present

## 2019-01-16 DIAGNOSIS — H40013 Open angle with borderline findings, low risk, bilateral: Secondary | ICD-10-CM | POA: Diagnosis not present

## 2019-01-16 DIAGNOSIS — E119 Type 2 diabetes mellitus without complications: Secondary | ICD-10-CM | POA: Diagnosis not present

## 2019-01-16 DIAGNOSIS — H25013 Cortical age-related cataract, bilateral: Secondary | ICD-10-CM | POA: Diagnosis not present

## 2019-01-16 LAB — HM DIABETES EYE EXAM

## 2019-01-26 ENCOUNTER — Encounter: Payer: Self-pay | Admitting: Family Medicine

## 2019-01-27 LAB — SAMPLE TO BLOOD BANK

## 2019-02-02 ENCOUNTER — Encounter: Payer: Self-pay | Admitting: Nurse Practitioner

## 2019-02-02 ENCOUNTER — Other Ambulatory Visit: Payer: Self-pay

## 2019-02-02 ENCOUNTER — Inpatient Hospital Stay: Payer: Medicare Other

## 2019-02-02 ENCOUNTER — Inpatient Hospital Stay: Payer: Medicare Other | Attending: Oncology | Admitting: Nurse Practitioner

## 2019-02-02 VITALS — BP 138/61 | HR 74 | Temp 99.0°F | Resp 17 | Ht 69.0 in | Wt 188.9 lb

## 2019-02-02 DIAGNOSIS — C220 Liver cell carcinoma: Secondary | ICD-10-CM

## 2019-02-02 DIAGNOSIS — D500 Iron deficiency anemia secondary to blood loss (chronic): Secondary | ICD-10-CM

## 2019-02-02 DIAGNOSIS — R918 Other nonspecific abnormal finding of lung field: Secondary | ICD-10-CM | POA: Insufficient documentation

## 2019-02-02 DIAGNOSIS — E119 Type 2 diabetes mellitus without complications: Secondary | ICD-10-CM | POA: Diagnosis not present

## 2019-02-02 DIAGNOSIS — D508 Other iron deficiency anemias: Secondary | ICD-10-CM | POA: Diagnosis not present

## 2019-02-02 DIAGNOSIS — Z8505 Personal history of malignant neoplasm of liver: Secondary | ICD-10-CM | POA: Diagnosis not present

## 2019-02-02 LAB — CBC WITH DIFFERENTIAL (CANCER CENTER ONLY)
Abs Immature Granulocytes: 0 10*3/uL (ref 0.00–0.07)
Basophils Absolute: 0 10*3/uL (ref 0.0–0.1)
Basophils Relative: 1 %
Eosinophils Absolute: 0 10*3/uL (ref 0.0–0.5)
Eosinophils Relative: 1 %
HCT: 29.5 % — ABNORMAL LOW (ref 39.0–52.0)
Hemoglobin: 9.3 g/dL — ABNORMAL LOW (ref 13.0–17.0)
Immature Granulocytes: 0 %
Lymphocytes Relative: 17 %
Lymphs Abs: 0.5 10*3/uL — ABNORMAL LOW (ref 0.7–4.0)
MCH: 29.7 pg (ref 26.0–34.0)
MCHC: 31.5 g/dL (ref 30.0–36.0)
MCV: 94.2 fL (ref 80.0–100.0)
Monocytes Absolute: 0.2 10*3/uL (ref 0.1–1.0)
Monocytes Relative: 8 %
Neutro Abs: 2 10*3/uL (ref 1.7–7.7)
Neutrophils Relative %: 73 %
Platelet Count: 89 10*3/uL — ABNORMAL LOW (ref 150–400)
RBC: 3.13 MIL/uL — ABNORMAL LOW (ref 4.22–5.81)
RDW: 14.5 % (ref 11.5–15.5)
WBC Count: 2.8 10*3/uL — ABNORMAL LOW (ref 4.0–10.5)
nRBC: 0 % (ref 0.0–0.2)

## 2019-02-02 LAB — FERRITIN: Ferritin: 23 ng/mL — ABNORMAL LOW (ref 24–336)

## 2019-02-02 NOTE — Progress Notes (Signed)
  [place] OFFICE PROGRESS NOTE   Diagnosis: Cirrhosis, pancytopenia  INTERVAL HISTORY:   [name] returns as scheduled.  He overall is feeling well.  Aside from occasional nosebleeds he denies bleeding.  Specifically no bloody or black stools.  He reports a good appetite.  No nausea or vomiting.  He has stable dyspnea on exertion.  No cough or fever.  Objective:  Vital signs in last 24 hours:  Blood pressure 138/61, pulse 74, temperature 99 F (37.2 C), temperature source Oral, resp. rate 17, height 5\' 9"  (1.753 m), weight 188 lb 14.4 oz (85.7 kg), SpO2 100 %.    HEENT: No thrush or ulcers. Lymphatics: No palpable cervical, supraclavicular or axillary lymph nodes. GI: Abdomen soft and nontender.  No hepatomegaly.  No mass. Vascular: No leg edema.   Lab Results:  Lab Results  Component Value Date   WBC 2.8 (L) 02/02/2019   HGB 9.3 (L) 02/02/2019   HCT 29.5 (L) 02/02/2019   MCV 94.2 02/02/2019   PLT 89 (L) 02/02/2019   NEUTROABS 2.0 02/02/2019    Imaging:  No results found.  Medications: I have reviewed the patient's current medications.  Assessment/Plan: 1. Hepatocellular carcinoma, stage I (T1 NX) status post partial left hepatectomy 03/02/2013.  08/31/2013 AFP 2.4.   CT abdomen 08/31/2013 with postoperative changes of partial hepatectomy with small postoperative fluid collection along the resection margin. No definite signs to suggest residual or locally recurrent disease. Several borderline enlarged and minimally enlarged lymph nodes superior to the liver in the juxtapericardiac fat of the lower anterior mediastinum with the largest measuring 11 mm slightly increased compared to the prior study 01/15/2013. Several prominent non-pathologically enlarged upper abdominal ligament lymph nodes similar to the prior study. Small esophageal varices.   CT abdomen 03/03/2012 without evidence of recurrent hepatocellular carcinoma, stable right lung  base nodule  CT the abdomen and pelvis 09/06/2014 without evidence of recurrent hepatocellular carcinoma, slight enlargement of pre-cardiac lymph nodes  CT abdomen/pelvis 09/07/2015 with no findings for recurrent tumor or metastatic disease. Stable small 3 mm right middle lobe pulmonary nodule since 2014. Epicardial lymph node measured 11 mm, previously 13 mm. Stable cirrhotic changes. Fairly significant esophageal varices.  CT 09/03/2016-negative for recurrent hepatocellular carcinoma, changes of cirrhosis with varices  CT 10/15/2017-no evidence of recurrent hepatocellular carcinoma, cirrhosis/varices 2. Cirrhosis. 3. Hepatitis B core antibody positive. 4. Diabetes. 5. Severe microcytic anemia-likely iron deficiency anemia-progressive 06/03/2017. Stool Hemoccult positive 3 06/05/2017.  6. Colonoscopy 12/27/2016-external and internal hemorrhoids. Diverticulosis in the sigmoid, descending and transverse colon. One medium polyp in the distal descending colon. 3 small polyps in the transverse colon. 2 diminutive polyps in the rectum and the proximal ascending colon. Medium-sized lipoma transverse colon. Small lipoma mid ascending colon. Multiplenonbleeding colonic angiodysplastic lesions. Pathology-tubular adenomas, hyperplastic polyps. 7. Pancytopenia secondary to cirrhosis and GI bleeding 8. Laparoscopic cholecystectomy 01/28/2018 9. Decreased iron stores 10/15/2018; transfused 2 units of blood 10/18/2018; Feraheme 11/04/2018 and 11/11/2018    Disposition: [name] appears stable.  He received IV iron in March.  Hemoglobin remains stable.  We will follow-up on the ferritin from today.  He will return for a follow-up CBC in 1 month.  We will see him with lab and follow-up in 2 months.  He will contact the office in the interim with any problems.    Ned Card ANP/GNP-BC   02/02/2019  1:53 PM

## 2019-02-04 ENCOUNTER — Telehealth: Payer: Self-pay | Admitting: Oncology

## 2019-02-04 NOTE — Telephone Encounter (Signed)
Scheduled per los. Called and left msg. Mailed printout  °

## 2019-02-09 ENCOUNTER — Telehealth: Payer: Self-pay

## 2019-02-09 NOTE — Telephone Encounter (Signed)
Spoke with pt, advised that ferritin level from last week returned low. Advised that he will need Feraheme weekly x2. Pt verbalized understanding.

## 2019-02-09 NOTE — Telephone Encounter (Signed)
Spoke with pt in regards to having Feraheme weekly. Pt states that Mondays or Wednesday will work for treatment.

## 2019-02-09 NOTE — Telephone Encounter (Signed)
-----   Message from [name], NP sent at 02/09/2019  9:04 AM EDT ----- Please let him know the ferritin from last week returned low.  We would like to schedule him for Feraheme weekly x2.  Please let me know the dates and I will enter orders.

## 2019-02-11 ENCOUNTER — Telehealth: Payer: Self-pay | Admitting: Oncology

## 2019-02-11 NOTE — Telephone Encounter (Signed)
Scheduled appt per 6/24 sch message - pt is aware of appt date and time   

## 2019-02-18 ENCOUNTER — Other Ambulatory Visit: Payer: Self-pay | Admitting: Oncology

## 2019-02-18 ENCOUNTER — Inpatient Hospital Stay: Payer: Medicare Other | Attending: Oncology

## 2019-02-18 ENCOUNTER — Other Ambulatory Visit: Payer: Self-pay

## 2019-02-18 VITALS — BP 129/69 | HR 67 | Temp 98.0°F | Resp 17

## 2019-02-18 DIAGNOSIS — Z8505 Personal history of malignant neoplasm of liver: Secondary | ICD-10-CM | POA: Insufficient documentation

## 2019-02-18 DIAGNOSIS — D500 Iron deficiency anemia secondary to blood loss (chronic): Secondary | ICD-10-CM | POA: Insufficient documentation

## 2019-02-18 MED ORDER — SODIUM CHLORIDE 0.9 % IV SOLN
510.0000 mg | Freq: Once | INTRAVENOUS | Status: AC
  Administered 2019-02-18: 510 mg via INTRAVENOUS
  Filled 2019-02-18: qty 17

## 2019-02-18 MED ORDER — SODIUM CHLORIDE 0.9 % IV SOLN
Freq: Once | INTRAVENOUS | Status: AC
  Administered 2019-02-18: 08:00:00 via INTRAVENOUS
  Filled 2019-02-18: qty 250

## 2019-02-18 NOTE — Patient Instructions (Signed)

## 2019-02-25 ENCOUNTER — Inpatient Hospital Stay: Payer: Medicare Other

## 2019-02-25 ENCOUNTER — Other Ambulatory Visit: Payer: Self-pay

## 2019-02-25 VITALS — BP 153/73 | HR 60 | Temp 97.8°F | Resp 18

## 2019-02-25 DIAGNOSIS — D500 Iron deficiency anemia secondary to blood loss (chronic): Secondary | ICD-10-CM

## 2019-02-25 DIAGNOSIS — Z8505 Personal history of malignant neoplasm of liver: Secondary | ICD-10-CM | POA: Diagnosis not present

## 2019-02-25 MED ORDER — SODIUM CHLORIDE 0.9 % IV SOLN
510.0000 mg | Freq: Once | INTRAVENOUS | Status: AC
  Administered 2019-02-25: 510 mg via INTRAVENOUS
  Filled 2019-02-25: qty 510

## 2019-02-25 MED ORDER — SODIUM CHLORIDE 0.9 % IV SOLN
Freq: Once | INTRAVENOUS | Status: AC
  Administered 2019-02-25: 08:00:00 via INTRAVENOUS
  Filled 2019-02-25: qty 250

## 2019-02-25 NOTE — Patient Instructions (Signed)

## 2019-02-27 ENCOUNTER — Other Ambulatory Visit: Payer: Self-pay

## 2019-02-27 MED ORDER — DIPHENHYDRAMINE HCL 50 MG/ML IJ SOLN
INTRAMUSCULAR | Status: AC
  Filled 2019-02-27: qty 1

## 2019-02-27 NOTE — Patient Outreach (Signed)
[place] [facility]) Care Management  02/27/2019  [name] 12-03-1945 [id number]    Telephone Screen Referral Date : 02/12/19 Referral Source:  [facility] High Risk Referral Reason:  Screening for possible needs Insurance:  [facility]   Outreach attempt # 1 to patient.  No answer.  No voicemail pick up after 5 rings unable to leave a message   Plan: [name] will send letter.  RN Health Coach will make an outreach attempt to the patient within four business days.   [name] RN, BSN, [place] Direct Dial:  [phone] Fax: [phone]

## 2019-03-02 ENCOUNTER — Other Ambulatory Visit: Payer: Self-pay

## 2019-03-02 ENCOUNTER — Inpatient Hospital Stay: Payer: Medicare Other

## 2019-03-02 DIAGNOSIS — C220 Liver cell carcinoma: Secondary | ICD-10-CM

## 2019-03-02 DIAGNOSIS — D500 Iron deficiency anemia secondary to blood loss (chronic): Secondary | ICD-10-CM

## 2019-03-02 DIAGNOSIS — Z8505 Personal history of malignant neoplasm of liver: Secondary | ICD-10-CM | POA: Diagnosis not present

## 2019-03-02 LAB — CBC WITH DIFFERENTIAL (CANCER CENTER ONLY)
Abs Immature Granulocytes: 0.01 10*3/uL (ref 0.00–0.07)
Basophils Absolute: 0 10*3/uL (ref 0.0–0.1)
Basophils Relative: 1 %
Eosinophils Absolute: 0 10*3/uL (ref 0.0–0.5)
Eosinophils Relative: 1 %
HCT: 30.5 % — ABNORMAL LOW (ref 39.0–52.0)
Hemoglobin: 10 g/dL — ABNORMAL LOW (ref 13.0–17.0)
Immature Granulocytes: 0 %
Lymphocytes Relative: 19 %
Lymphs Abs: 0.6 10*3/uL — ABNORMAL LOW (ref 0.7–4.0)
MCH: 31.1 pg (ref 26.0–34.0)
MCHC: 32.8 g/dL (ref 30.0–36.0)
MCV: 94.7 fL (ref 80.0–100.0)
Monocytes Absolute: 0.3 10*3/uL (ref 0.1–1.0)
Monocytes Relative: 10 %
Neutro Abs: 2.1 10*3/uL (ref 1.7–7.7)
Neutrophils Relative %: 69 %
Platelet Count: 68 10*3/uL — ABNORMAL LOW (ref 150–400)
RBC: 3.22 MIL/uL — ABNORMAL LOW (ref 4.22–5.81)
RDW: 16.6 % — ABNORMAL HIGH (ref 11.5–15.5)
WBC Count: 3 10*3/uL — ABNORMAL LOW (ref 4.0–10.5)
nRBC: 0 % (ref 0.0–0.2)

## 2019-03-02 LAB — FERRITIN: Ferritin: 441 ng/mL — ABNORMAL HIGH (ref 24–336)

## 2019-03-03 ENCOUNTER — Other Ambulatory Visit: Payer: Self-pay

## 2019-03-03 NOTE — Patient Outreach (Signed)
[facility] [facility]) Care Management  03/03/2019  [name] [birth year] [id number]    Telephone Screen Referral Date : 02/12/19 Referral Source:  [facility] High Risk Referral Reason:  Screening for possible needs Insurance:  [facility]   Outreach attempt #  2 to patient.  HIPAA provided by the patient  .  Explained to the patient health coach role and [facility] services.  Social: The patient lives in the home alone.  He states he does not have any family that stays close to him to assist him and he is about the healthiest one of his friends.  He states that he is independent with his ADLS and independent /assist with his IADLS.  He states he could use help in the home cleaning.  He informed me that he has been put on a list and is currently waiting for help.  He has transportation to his appointments.  Equipment in the home include: cane and eyeglasses (reading).  Conditions: Per the patient he has diabetes and hypertension.  The patient said that he is diet controlled.  His last a1c on 07/30/18 was 4.8.  He states that he has not been told that he needs to check his blood sugars.  His states his blood pressure areunder control.  He does not feel that he needs education for his conditions.  Medications: Per the patient he is on about 6 medications.  He is able to mange his medications.  He state he has problems paying for his Amphetamine-dextroamphetamine 30 mg.  He states that his co-pay for the medications is about 200 hundred dollars. RN Health Coach will put in a referral to pharmacy for medication assistance.  Appointments:The patient states that he has an appointment with his PCP in the next 2-3 months  Plan: [place] will place a referral to pharmacy to help with medication assistance. No other disciplines needed at this time.   [name] RN, BSN, [place] Direct Dial:  [phone] Fax: [phone]

## 2019-03-06 ENCOUNTER — Other Ambulatory Visit: Payer: Self-pay | Admitting: Pharmacist

## 2019-03-06 NOTE — Patient Outreach (Signed)
[facility]) [place]   03/06/2019  CASTER FAYETTE 10-01-1945 [id number]  Reason for referral: Medication Assistance with Adderall XR -per review of [facility], patient has had [place] referrals in the past which were closed due to inability to maintain contact with patient  Referral source: [place] Current insurance: [employer]  Outreach:  Unsuccessful telephone call attempt #1 to patient. Unable to leave message  Plan:  -I will mail patient an unsuccessful outreach letter.  -I will make another outreach attempt to patient within 3-4 business days.    Ralene Bathe, PharmD, [place] [phone]

## 2019-03-09 ENCOUNTER — Ambulatory Visit: Payer: Medicare Other | Admitting: Registered Nurse

## 2019-03-09 ENCOUNTER — Encounter: Payer: Self-pay | Admitting: Registered Nurse

## 2019-03-09 ENCOUNTER — Other Ambulatory Visit: Payer: Self-pay

## 2019-03-09 VITALS — BP 134/68 | HR 68 | Temp 98.6°F | Resp 16 | Ht 69.0 in | Wt 190.0 lb

## 2019-03-09 DIAGNOSIS — Z13220 Encounter for screening for lipoid disorders: Secondary | ICD-10-CM | POA: Diagnosis not present

## 2019-03-09 DIAGNOSIS — Z13228 Encounter for screening for other metabolic disorders: Secondary | ICD-10-CM | POA: Diagnosis not present

## 2019-03-09 DIAGNOSIS — Z1329 Encounter for screening for other suspected endocrine disorder: Secondary | ICD-10-CM | POA: Diagnosis not present

## 2019-03-09 DIAGNOSIS — Z130 Encounter for screening for diseases of the blood and blood-forming organs and certain disorders involving the immune mechanism: Secondary | ICD-10-CM

## 2019-03-09 NOTE — Progress Notes (Signed)
[name] is a pleasant 73 y.o. male presenting today for Medicare Annual Wellness Visit.    Date of last exam: 04/04/2017  Interpreter used for this visit? No  Patient Care Team: Wendie Agreste, MD as PCP - General (Family Medicine) Dohmeier, Asencion Partridge, MD as Consulting Physician (Neurology) Festus Aloe, MD as Consulting Physician (Urology) Rudean [name], [facility] as [place] Management (Pharmacist)   Other items to address today: Pt needs labs drawn   Other Screening: Last screening for diabetes:  Lab Results  Component Value Date   HGBA1C 4.8 07/30/2018    Last lipid screening: Lab Results  Component Value Date   CHOL 152 11/07/2017   HDL 53 11/07/2017   LDLCALC 81 11/07/2017   TRIG 91 11/07/2017   CHOLHDL 2.9 11/07/2017     ADVANCE DIRECTIVES: Discussed: yes On File: no Materials Provided: no  Immunization status:  Immunization History  Administered Date(s) Administered  . Influenza Split 06/02/2012  . Influenza, High Dose Seasonal PF 06/11/2018  . Influenza,inj,Quad PF,6+ Mos 06/01/2013, 04/19/2014, 06/13/2015, 04/19/2016, 05/20/2017  . Pneumococcal Conjugate-13 03/14/2015  . Pneumococcal Polysaccharide-23 08/03/2011, 06/02/2012     Health Maintenance Due  Topic Date Due  . TETANUS/TDAP  08/26/1963  . HEMOGLOBIN A1C  01/29/2019     Functional Status Survey: Is the patient deaf or have difficulty hearing?: Yes Does the patient have difficulty seeing, even when wearing glasses/contacts?: No Does the patient have difficulty concentrating, remembering, or making decisions?: Yes Does the patient have difficulty walking or climbing stairs?: No Does the patient have difficulty dressing or bathing?: No Does the patient have difficulty doing errands alone such as visiting a doctor's office or shopping?: No   6CIT Screen 03/09/2019 04/17/2018 04/04/2017  What Year? 0 points 0 points 0 points  What month? 0 points 3  points 0 points  What time? 0 points 0 points 0 points  Count back from 20 2 points 0 points 0 points  Months in reverse 2 points 4 points 4 points  Repeat phrase 4 points 4 points 2 points  Total Score '8 11 6         ' Home Environment: Lives alone. Safe and comfortable at home. Denies tripping hazards such as throw rugs. Denies grab bars in shower   Patient Active Problem List   Diagnosis Date Noted  . Iron deficiency anemia 11/04/2018  . Orthostatic hypotension 08/06/2018  . Hypertension 08/06/2018  . Hyperlipidemia 08/06/2018  . Pancytopenia ([place]) 08/06/2018  . 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 Insufficient treatment with nasal CPAP 05/05/2018  . 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 Irregular heart beat 01/28/2018  . Noncompliance with CPAP treatment 08/14/2017  . Excessive daytime sleepiness 04/09/2017  . Snoring 04/09/2017  . Memory impairment of gradual onset 04/09/2017  . Aortic atherosclerosis ([place]) 11/27/2016  . 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 Altered mental status 03/05/2013  . Respiratory failure, post-operative ([place]) 03/02/2013  . Hepatitis B 02/24/2013  . Cancer, hepatocellular ([place]) 02/10/2013  . Liver tumor-bleeding 01/15/2013  . Epidermal cyst 06/18/2012  . OSA (obstructive sleep apnea) 03/13/2012  . Abnormal leg movement 12/12/2011  . Blood pressure elevated 09/06/2011  . Diabetes mellitus ([place]) 09/06/2011  . 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 BPH (benign prostatic hyperplasia) 09/06/2011  . Diverticula of colon 09/06/2011     Past Medical History:  Diagnosis Date  . Cancer ([place])  hepatocellular cancer   . Diabetes mellitus without complication ([place])   . Dyspnea   . Hepatitis B   . Hyperlipidemia   . Hypertension   . Irregular heart beat 01/28/2018  . Narcolepsy    per office visit note of 08/2011   . Sleep apnea    cpap     Past Surgical  History:  Procedure Laterality Date  . CHOLECYSTECTOMY N/A 01/28/2018   Procedure: LAPAROSCOPIC CHOLECYSTECTOMY;  Surgeon: [name], MD;  Location: [facility];  Service: General;  Laterality: N/A;  . COLONOSCOPY WITH PROPOFOL N/A 08/27/2017   Procedure: COLONOSCOPY WITH PROPOFOL;  Surgeon: Clarene Essex, MD;  Location: [facility];  Service: Endoscopy;  Laterality: N/A;  . HOT HEMOSTASIS N/A 08/27/2017   Procedure: HOT HEMOSTASIS (ARGON PLASMA COAGULATION/BICAP);  Surgeon: Clarene Essex, MD;  Location: [facility];  Service: Endoscopy;  Laterality: N/A;  . LAPAROSCOPIC CHOLECYSTECTOMY  01/28/2018  . LAPAROSCOPY  03/02/2013   Procedure: LAPAROSCOPY DIAGNOSTIC;  Surgeon: [name], MD;  [facility]: [facility];  Service: General;;  . LIVER ULTRASOUND  03/02/2013   Procedure: LIVER ULTRASOUND;  Surgeon: [name], MD;  [facility];  Service: General;;  . OPEN PARTIAL HEPATECTOMY   03/02/2013   Procedure: OPEN PARTIAL HEPATECTOMY [83];  Surgeon: [name], MD;  Location: [facility];  Service: General;;  DX LAPAROSCOPY, INTRAOPERATIVE LIVER ULTRASOUND, OPEN PARTIAL HEPATECTOMY  . pinched nerve in back       Family History  Problem Relation Age of Onset  . Dementia Mother   . Cancer Father   . Diabetes Brother   . Hypertension Brother   . Cancer Brother      Social History   Socioeconomic History  . Marital status: Divorced    Spouse name: Not on file  . Number of children: 2  . Years of education: 15+  . Highest education level: Bachelor's degree (e.g., BA, AB, BS)  Occupational History  . Not on file  Social Needs  . Financial resource strain: Very hard  . Food insecurity    Worry: Sometimes true    Inability: Sometimes true  . Transportation needs    Medical: No    Non-medical: No  Tobacco Use  . Smoking status: Former Smoker    Years: 5.00    Types: Cigarettes  . Smokeless tobacco: Never Used  Substance and Sexual Activity  . Alcohol use: No    Frequency: Never  . Drug  use: No  . Sexual activity: Not Currently  Lifestyle  . Physical activity    Days per week: 0 days    Minutes per session: 0 min  . Stress: Not at all  Relationships  . Social Herbalist on phone: Once a week    Gets together: Once a week    Attends religious service: 1 to 4 times per year    Active member of club or organization: No    Attends meetings of clubs or organizations: Never    Relationship status: Divorced  . Intimate partner violence    Fear of current or ex partner: Not on file    Emotionally abused: Not on file    Physically abused: Not on file    Forced sexual activity: Not on file  Other Topics Concern  . Not on file  Social History Narrative   Patient is single and lives alone- became widower when 1 child was 88 year old, divorced 2nd marriage   Has #2 grown children, not in area  or involved in his life   Patient is retired.   Patient has a college education.   Patient is right-handed.   Patient drinks maybe two sodas daily.     Allergies  Allergen Reactions  . Ace Inhibitors Swelling and Other (See Comments)    Angioedema - face.      Prior to Admission medications   Medication Sig Start Date End Date Taking? Authorizing Provider  ACCU-CHEK SOFTCLIX LANCETS lancets Once per day testing. 08/05/17  Yes Wendie Agreste, MD  alfuzosin (UROXATRAL) 10 MG 24 hr tablet Take 1 tablet (10 mg total) by mouth at bedtime. 06/11/18  Yes Wendie Agreste, MD  amphetamine-dextroamphetamine (ADDERALL XR) 30 MG 24 hr capsule Take daily by mouth with water 30 minutes before breakfast. 11/13/18  Yes Dohmeier, Asencion Partridge, MD  blood glucose meter kit and supplies Dispense based on patient and insurance preference. Use up to four times daily as directed. (FOR ICD-9 250.00, 250.01). 04/25/17  Yes Wendie Agreste, MD  donepezil (ARICEPT) 10 MG tablet Take 1 tablet (10 mg total) by mouth at bedtime. 10/15/18  Yes Dohmeier, Asencion Partridge, MD  feeding supplement, ENSURE ENLIVE,  (ENSURE ENLIVE) LIQD Take 237 mLs by mouth 2 (two) times daily between meals. 08/08/18  Yes Emokpae, Courage, MD  ferrous sulfate (KP FERROUS SULFATE) 325 (65 FE) MG tablet Take 1 tablet (325 mg total) by mouth 3 (three) times daily with meals. 08/08/18  Yes Emokpae, Courage, MD  glucose blood (ACCU-CHEK AVIVA PLUS) test strip 1 each by Other route as needed for other. Use as instructed - once per day testing for now. 07/30/18  Yes Wendie Agreste, MD  Multiple Vitamin (MULTIVITAMIN WITH MINERALS) TABS tablet Take 1 tablet by mouth daily. 08/09/18  Yes Emokpae, Courage, MD  pravastatin (PRAVACHOL) 20 MG tablet Take 1 tablet (20 mg total) by mouth at bedtime. 06/11/18  Yes Wendie Agreste, MD  psyllium (METAMUCIL) 58.6 % powder Take 1 packet by mouth daily. Member purchased over-the-counter 09/22/18  Yes [provider]  sertraline (ZOLOFT) 25 MG tablet Take 1 tablet (25 mg total) by mouth daily. 07/30/18  Yes Wendie Agreste, MD  spironolactone (ALDACTONE) 25 MG tablet Take 1 tablet (25 mg total) by mouth daily. 08/08/18  Yes Roxan Hockey, MD     Depression screen [facility] 2/9 03/09/2019 03/09/2019 03/03/2019 10/16/2018 09/01/2018  Decreased Interest 0 1 1 0 0  Down, Depressed, Hopeless 0 1 0 0 0  PHQ - 2 Score 0 2 1 0 0  Altered sleeping 0 0 - - -  Tired, decreased energy 0 1 - - -  Change in appetite 0 0 - - -  Feeling bad or failure about yourself  0 0 - - -  Trouble concentrating - - - - -  Moving slowly or fidgety/restless 0 1 - - -  Suicidal thoughts 0 0 - - -  PHQ-9 Score 0 4 - - -  Difficult doing work/chores - - - - -  Some recent data might be hidden     Fall Risk  03/09/2019 03/09/2019 10/16/2018 09/01/2018 08/18/2018  Falls in the past year? 1 0 1 0 0  Comment - - - - -  Number falls in past yr: 0 - 1 - -  Comment - - - - -  Injury with Fall? 1 0 1 - -  Comment - - broke ribs - -  Risk Factor Category  - - - - -  Risk for fall  due to : History of fall(s);Impaired  balance/gait - History of fall(s) - -  Risk for fall due to: Comment - - - - -  Follow up Falls prevention discussed;Education provided - Falls evaluation completed - -      PHYSICAL EXAM: BP 134/68   Pulse 68   Temp 98.6 F (37 C) (Oral)   Resp 16   Ht '5\' 9"'  (1.753 m)   Wt 190 lb (86.2 kg)   SpO2 96%   BMI 28.06 kg/m    Wt Readings from Last 3 Encounters:  03/09/19 190 lb (86.2 kg)  02/02/19 188 lb 14.4 oz (85.7 kg)  10/29/18 173 lb (78.5 kg)      Hearing Screening   '125Hz'  '250Hz'  '500Hz'  '1000Hz'  '2000Hz'  '3000Hz'  '4000Hz'  '6000Hz'  '8000Hz'   Right ear:           Left ear:             Visual Acuity Screening   Right eye Left eye Both eyes  Without correction:     With correction: '20/25 20/25 20/25 '      Physical Exam As this visit was conducted by video chat, a physical exam did not take place.  Education/Counseling provided regarding diet and exercise, prevention of chronic diseases, smoking/tobacco cessation, if applicable, and reviewed "Covered Medicare Preventive Services."   ASSESSMENT/PLAN: 1. Screening for endocrine, metabolic and immunity disorder   2. Lipid screening      Thank you for your visit with Primary Care at [facility] today.  Maximiano Coss, NP [facility] Primary Care at [place], [place] [address] [phone] - [phone]

## 2019-03-09 NOTE — Patient Instructions (Signed)
° ° ° °  If you have lab work done today you will be contacted with your lab results within the next 2 weeks.  If you have not heard from us then please contact us. The fastest way to get your results is to register for My Chart. ° ° °IF you received an x-ray today, you will receive an invoice from [facility]. Please contact [facility] at [phone] with questions or concerns regarding your invoice.  ° °IF you received labwork today, you will receive an invoice from [facility]. Please contact [facility] at [phone] with questions or concerns regarding your invoice.  ° °Our billing staff will not be able to assist you with questions regarding bills from these companies. ° °You will be contacted with the lab results as soon as they are available. The fastest way to get your results is to activate your My Chart account. Instructions are located on the last page of this paperwork. If you have not heard from us regarding the results in 2 weeks, please contact this office. °  ° ° ° °

## 2019-03-10 LAB — LIPID PANEL
Chol/HDL Ratio: 1.8 ratio (ref 0.0–5.0)
Cholesterol, Total: 137 mg/dL (ref 100–199)
HDL: 76 mg/dL (ref >39)
LDL Calculated: 48 mg/dL (ref 0–99)
Triglycerides: 65 mg/dL (ref 0–149)
VLDL Cholesterol Cal: 13 mg/dL (ref 5–40)

## 2019-03-10 LAB — COMPREHENSIVE METABOLIC PANEL
ALT: 30 IU/L (ref 0–44)
AST: 44 IU/L — ABNORMAL HIGH (ref 0–40)
Albumin/Globulin Ratio: 1 — ABNORMAL LOW (ref 1.2–2.2)
Albumin: 3.5 g/dL — ABNORMAL LOW (ref 3.7–4.7)
Alkaline Phosphatase: 125 IU/L — ABNORMAL HIGH (ref 39–117)
BUN/Creatinine Ratio: 9 — ABNORMAL LOW (ref 10–24)
BUN: 9 mg/dL (ref 8–27)
Bilirubin Total: 1.2 mg/dL (ref 0.0–1.2)
CO2: 21 mmol/L (ref 20–29)
Calcium: 8.7 mg/dL (ref 8.6–10.2)
Chloride: 108 mmol/L — ABNORMAL HIGH (ref 96–106)
Creatinine, Ser: 1.03 mg/dL (ref 0.76–1.27)
GFR calc Af Amer: 82 mL/min/{1.73_m2} (ref >59)
GFR calc non Af Amer: 71 mL/min/{1.73_m2} (ref >59)
Globulin, Total: 3.6 g/dL (ref 1.5–4.5)
Glucose: 165 mg/dL — ABNORMAL HIGH (ref 65–99)
Potassium: 4 mmol/L (ref 3.5–5.2)
Sodium: 144 mmol/L (ref 134–144)
Total Protein: 7.1 g/dL (ref 6.0–8.5)

## 2019-03-10 LAB — CBC WITH DIFFERENTIAL/PLATELET
Basophils Absolute: 0 10*3/uL (ref 0.0–0.2)
Basos: 1 %
EOS (ABSOLUTE): 0 10*3/uL (ref 0.0–0.4)
Eos: 1 %
Hematocrit: 30.8 % — ABNORMAL LOW (ref 37.5–51.0)
Hemoglobin: 10.2 g/dL — ABNORMAL LOW (ref 13.0–17.7)
Immature Grans (Abs): 0 10*3/uL (ref 0.0–0.1)
Immature Granulocytes: 0 %
Lymphocytes Absolute: 0.5 10*3/uL — ABNORMAL LOW (ref 0.7–3.1)
Lymphs: 18 %
MCH: 30.6 pg (ref 26.6–33.0)
MCHC: 33.1 g/dL (ref 31.5–35.7)
MCV: 93 fL (ref 79–97)
Monocytes Absolute: 0.3 10*3/uL (ref 0.1–0.9)
Monocytes: 10 %
Neutrophils Absolute: 2.1 10*3/uL (ref 1.4–7.0)
Neutrophils: 70 %
Platelets: 85 10*3/uL — CL (ref 150–450)
RBC: 3.33 x10E6/uL — ABNORMAL LOW (ref 4.14–5.80)
RDW: 16.9 % — ABNORMAL HIGH (ref 11.6–15.4)
WBC: 3 10*3/uL — ABNORMAL LOW (ref 3.4–10.8)

## 2019-03-10 LAB — HEMOGLOBIN A1C
Est. average glucose Bld gHb Est-mCnc: 126 mg/dL
Hgb A1c MFr Bld: 6 % — ABNORMAL HIGH (ref 4.8–5.6)

## 2019-03-10 NOTE — Progress Notes (Signed)
Resulted reviewed with myself, Caro Hight, RN, and Dr. [name], MD, who is the patient's PCP. Given this patient's history of pancytopenia, there are no concerning trends at this time. He is followed by hematology consistently. Otherwise, his A1c is elevated compared to last reading - however, this patient has a history of hypoglycemia with A1cs as low as 4.8 seven months prior. After speaking with Dr. Carlota Raspberry, he is in agreement that a reasonable A1c goal for this patient is below 7.5   Kathrin Ruddy, NP

## 2019-03-12 ENCOUNTER — Other Ambulatory Visit: Payer: Self-pay | Admitting: Pharmacist

## 2019-03-12 ENCOUNTER — Ambulatory Visit: Payer: Self-pay | Admitting: Pharmacist

## 2019-03-12 NOTE — Patient Outreach (Addendum)
[facility]) [place]  03/12/2019  [name] [birth year] [id number]   Reason for referral: Medication Assistance with Adderall XR -per review of [facility], patient has had [place] referrals in the past which were closed due to inability to maintain contact with patient  Referral source: [place] Current insurance: [facility]  Outreach:  Unsuccessful telephone call attempt #2 to patient.   Unable to leave message  Plan:  -I will make another outreach attempt to patient within 3-4 business days.    Ralene Bathe, PharmD, [place] [phone]  Addendum:   Incoming call from patient.  He reports he uses [facility] representative.  He states his current insurance is [employer].  He reports he last filled his Adderall XR medication ~ 4 months ago and prefers to take the brand name only.    Call placed to [facility].  Patient last filled in March, cost $55 / 30 day supply.  C-opay currently $80.53 / 30 DS, RX on hold if patient would like it iflled.    Reviewed Good RX coupon prices for [employer] / 30 DS.    Patient voiced understanding.  He is not sure he can still afford it but he will try to come up with the money.  No further medication questions.   [facility] pharmacy case is being closed due to the following reasons: -No further medication assistance options for Addrerall XR -I have provided my contact information if patient or family needs to reach out to me in the future.  -Thank you for allowing [facility] pharmacy to be involved in this patient's care.   Ralene Bathe, PharmD, [place] [phone]

## 2019-03-19 ENCOUNTER — Ambulatory Visit: Payer: Self-pay | Admitting: Pharmacist

## 2019-03-30 ENCOUNTER — Telehealth: Payer: Self-pay | Admitting: Oncology

## 2019-03-30 ENCOUNTER — Inpatient Hospital Stay: Payer: Medicare Other

## 2019-03-30 ENCOUNTER — Other Ambulatory Visit: Payer: Self-pay

## 2019-03-30 ENCOUNTER — Inpatient Hospital Stay: Payer: Medicare Other | Attending: Oncology | Admitting: Oncology

## 2019-03-30 VITALS — BP 139/65 | HR 68 | Temp 98.8°F | Resp 17 | Ht 69.0 in | Wt 190.2 lb

## 2019-03-30 DIAGNOSIS — D124 Benign neoplasm of descending colon: Secondary | ICD-10-CM | POA: Insufficient documentation

## 2019-03-30 DIAGNOSIS — K621 Rectal polyp: Secondary | ICD-10-CM | POA: Insufficient documentation

## 2019-03-30 DIAGNOSIS — E119 Type 2 diabetes mellitus without complications: Secondary | ICD-10-CM | POA: Insufficient documentation

## 2019-03-30 DIAGNOSIS — K644 Residual hemorrhoidal skin tags: Secondary | ICD-10-CM | POA: Insufficient documentation

## 2019-03-30 DIAGNOSIS — D1779 Benign lipomatous neoplasm of other sites: Secondary | ICD-10-CM | POA: Diagnosis not present

## 2019-03-30 DIAGNOSIS — D500 Iron deficiency anemia secondary to blood loss (chronic): Secondary | ICD-10-CM | POA: Diagnosis not present

## 2019-03-30 DIAGNOSIS — K648 Other hemorrhoids: Secondary | ICD-10-CM | POA: Diagnosis not present

## 2019-03-30 DIAGNOSIS — C220 Liver cell carcinoma: Secondary | ICD-10-CM | POA: Insufficient documentation

## 2019-03-30 DIAGNOSIS — K5520 Angiodysplasia of colon without hemorrhage: Secondary | ICD-10-CM | POA: Insufficient documentation

## 2019-03-30 DIAGNOSIS — I81 Portal vein thrombosis: Secondary | ICD-10-CM | POA: Diagnosis not present

## 2019-03-30 DIAGNOSIS — D123 Benign neoplasm of transverse colon: Secondary | ICD-10-CM | POA: Insufficient documentation

## 2019-03-30 DIAGNOSIS — I8510 Secondary esophageal varices without bleeding: Secondary | ICD-10-CM | POA: Insufficient documentation

## 2019-03-30 DIAGNOSIS — D61818 Other pancytopenia: Secondary | ICD-10-CM | POA: Insufficient documentation

## 2019-03-30 DIAGNOSIS — Z79899 Other long term (current) drug therapy: Secondary | ICD-10-CM | POA: Diagnosis not present

## 2019-03-30 DIAGNOSIS — D122 Benign neoplasm of ascending colon: Secondary | ICD-10-CM | POA: Diagnosis not present

## 2019-03-30 DIAGNOSIS — K7460 Unspecified cirrhosis of liver: Secondary | ICD-10-CM | POA: Insufficient documentation

## 2019-03-30 DIAGNOSIS — B1910 Unspecified viral hepatitis B without hepatic coma: Secondary | ICD-10-CM | POA: Diagnosis not present

## 2019-03-30 LAB — CMP (CANCER CENTER ONLY)
ALT: 24 U/L (ref 0–44)
AST: 37 U/L (ref 15–41)
Albumin: 3.3 g/dL — ABNORMAL LOW (ref 3.5–5.0)
Alkaline Phosphatase: 118 U/L (ref 38–126)
Anion gap: 11 (ref 5–15)
BUN: 9 mg/dL (ref 8–23)
CO2: 23 mmol/L (ref 22–32)
Calcium: 9 mg/dL (ref 8.9–10.3)
Chloride: 106 mmol/L (ref 98–111)
Creatinine: 1.28 mg/dL — ABNORMAL HIGH (ref 0.61–1.24)
GFR, Est AFR Am: >60 mL/min (ref >60)
GFR, Estimated: 55 mL/min — ABNORMAL LOW (ref >60)
Glucose, Bld: 230 mg/dL — ABNORMAL HIGH (ref 70–99)
Potassium: 4 mmol/L (ref 3.5–5.1)
Sodium: 140 mmol/L (ref 135–145)
Total Bilirubin: 1.4 mg/dL — ABNORMAL HIGH (ref 0.3–1.2)
Total Protein: 7.2 g/dL (ref 6.5–8.1)

## 2019-03-30 LAB — CBC WITH DIFFERENTIAL (CANCER CENTER ONLY)
Abs Immature Granulocytes: 0.01 10*3/uL (ref 0.00–0.07)
Basophils Absolute: 0 10*3/uL (ref 0.0–0.1)
Basophils Relative: 1 %
Eosinophils Absolute: 0 10*3/uL (ref 0.0–0.5)
Eosinophils Relative: 2 %
HCT: 32.9 % — ABNORMAL LOW (ref 39.0–52.0)
Hemoglobin: 11.1 g/dL — ABNORMAL LOW (ref 13.0–17.0)
Immature Granulocytes: 0 %
Lymphocytes Relative: 23 %
Lymphs Abs: 0.6 10*3/uL — ABNORMAL LOW (ref 0.7–4.0)
MCH: 31.3 pg (ref 26.0–34.0)
MCHC: 33.7 g/dL (ref 30.0–36.0)
MCV: 92.7 fL (ref 80.0–100.0)
Monocytes Absolute: 0.2 10*3/uL (ref 0.1–1.0)
Monocytes Relative: 9 %
Neutro Abs: 1.8 10*3/uL (ref 1.7–7.7)
Neutrophils Relative %: 65 %
Platelet Count: 58 10*3/uL — ABNORMAL LOW (ref 150–400)
RBC: 3.55 MIL/uL — ABNORMAL LOW (ref 4.22–5.81)
RDW: 14.9 % (ref 11.5–15.5)
WBC Count: 2.7 10*3/uL — ABNORMAL LOW (ref 4.0–10.5)
nRBC: 0 % (ref 0.0–0.2)

## 2019-03-30 LAB — FERRITIN: Ferritin: 213 ng/mL (ref 24–336)

## 2019-03-30 NOTE — Progress Notes (Signed)
[facility] OFFICE PROGRESS NOTE   Diagnosis: Hepatocellular carcinoma, anemia  INTERVAL HISTORY:   Mr. Mcglasson returns as scheduled.  He reports intermittent discomfort in the right knee and right upper abdomen.  He is taking iron.  Objective:  Vital signs in last 24 hours:  Blood pressure 139/65, pulse 68, temperature 98.8 F (37.1 C), temperature source Oral, resp. rate 17, height 5\' 9"  (1.753 m), weight 190 lb 3.2 oz (86.3 kg), SpO2 100 %.    Physical examination secondary to distancing with the COVID pandemic GI: No hepatosplenomegaly, nontender Vascular: No leg edema   Lab Results:  Lab Results  Component Value Date   WBC 2.7 (L) 03/30/2019   HGB 11.1 (L) 03/30/2019   HCT 32.9 (L) 03/30/2019   MCV 92.7 03/30/2019   PLT 58 (L) 03/30/2019   NEUTROABS 1.8 03/30/2019    CMP  Lab Results  Component Value Date   NA 140 03/30/2019   K 4.0 03/30/2019   CL 106 03/30/2019   CO2 23 03/30/2019   GLUCOSE 230 (H) 03/30/2019   BUN 9 03/30/2019   CREATININE 1.28 (H) 03/30/2019   CALCIUM 9.0 03/30/2019   PROT 7.2 03/30/2019   ALBUMIN 3.3 (L) 03/30/2019   AST 37 03/30/2019   ALT 24 03/30/2019   ALKPHOS 118 03/30/2019   BILITOT 1.4 (H) 03/30/2019   GFRNONAA 55 (L) 03/30/2019   GFRAA >60 03/30/2019    Medications: I have reviewed the patient's current medications.   Assessment/Plan: 1. Hepatocellular carcinoma, stage I (T1 NX) status post partial left hepatectomy 03/02/2013.  08/31/2013 AFP 2.4.   CT abdomen 08/31/2013 with postoperative changes of partial hepatectomy with small postoperative fluid collection along the resection margin. No definite signs to suggest residual or locally recurrent disease. Several borderline enlarged and minimally enlarged lymph nodes superior to the liver in the juxtapericardiac fat of the lower anterior mediastinum with the largest measuring 11 mm slightly increased compared to the prior study 01/15/2013. Several  prominent non-pathologically enlarged upper abdominal ligament lymph nodes similar to the prior study. Small esophageal varices.   CT abdomen 03/03/2012 without evidence of recurrent hepatocellular carcinoma, stable right lung base nodule  CT the abdomen and pelvis 09/06/2014 without evidence of recurrent hepatocellular carcinoma, slight enlargement of pre-cardiac lymph nodes  CT abdomen/pelvis 09/07/2015 with no findings for recurrent tumor or metastatic disease. Stable small 3 mm right middle lobe pulmonary nodule since 2014. Epicardial lymph node measured 11 mm, previously 13 mm. Stable cirrhotic changes. Fairly significant esophageal varices.  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 Cirrhosis. 3. Hepatitis B core antibody positive. 4. Diabetes. 5. Severe microcytic anemia-likely iron deficiency anemia-progressive 06/03/2017. Stool Hemoccult positive 3 06/05/2017.  6. Colonoscopy 12/27/2016-external and internal hemorrhoids. Diverticulosis in the sigmoid, descending and transverse colon. One medium polyp in the distal descending colon. 3 small polyps in the transverse colon. 2 diminutive polyps in the rectum and the proximal ascending colon. Medium-sized lipoma transverse colon. Small lipoma mid ascending colon. Multiplenonbleeding colonic angiodysplastic lesions. Pathology-tubular adenomas, hyperplastic polyps. 7. Pancytopenia secondary to cirrhosis and GI bleeding 8. Laparoscopic cholecystectomy 01/28/2018 9. Decreased iron stores 10/15/2018; transfused 2 units of blood 10/18/2018; Feraheme 11/04/2018 and 11/11/2018   Disposition: Mr. Foerster is in clinical remission from hepatocellular carcinoma.  The hemoglobin is improved.  He will continue iron therapy.   He will  return for an office visit in 3 months.  We will check an AFP when he returns in 3 months.  Betsy Coder, MD  03/30/2019  9:14 AM

## 2019-03-30 NOTE — Telephone Encounter (Signed)
Scheduled appt per 8/10 los - gave patient AVS and calender per los.   

## 2019-05-27 ENCOUNTER — Ambulatory Visit (INDEPENDENT_AMBULATORY_CARE_PROVIDER_SITE_OTHER): Payer: Medicare Other | Admitting: Family Medicine

## 2019-05-27 ENCOUNTER — Other Ambulatory Visit: Payer: Self-pay

## 2019-05-27 ENCOUNTER — Encounter: Payer: Self-pay | Admitting: Family Medicine

## 2019-05-27 VITALS — BP 127/66 | HR 78 | Temp 98.7°F | Wt 195.8 lb

## 2019-05-27 DIAGNOSIS — D649 Anemia, unspecified: Secondary | ICD-10-CM | POA: Diagnosis not present

## 2019-05-27 DIAGNOSIS — R195 Other fecal abnormalities: Secondary | ICD-10-CM

## 2019-05-27 DIAGNOSIS — N400 Enlarged prostate without lower urinary tract symptoms: Secondary | ICD-10-CM | POA: Diagnosis not present

## 2019-05-27 DIAGNOSIS — E785 Hyperlipidemia, unspecified: Secondary | ICD-10-CM

## 2019-05-27 DIAGNOSIS — E119 Type 2 diabetes mellitus without complications: Secondary | ICD-10-CM

## 2019-05-27 DIAGNOSIS — F329 Major depressive disorder, single episode, unspecified: Secondary | ICD-10-CM

## 2019-05-27 DIAGNOSIS — Z23 Encounter for immunization: Secondary | ICD-10-CM

## 2019-05-27 DIAGNOSIS — F32A Depression, unspecified: Secondary | ICD-10-CM

## 2019-05-27 DIAGNOSIS — K5900 Constipation, unspecified: Secondary | ICD-10-CM | POA: Diagnosis not present

## 2019-05-27 DIAGNOSIS — I10 Essential (primary) hypertension: Secondary | ICD-10-CM

## 2019-05-27 MED ORDER — ALFUZOSIN HCL ER 10 MG PO TB24: 10.0000 mg | ORAL_TABLET | Freq: Every day | ORAL | 1 refills | Status: DC

## 2019-05-27 MED ORDER — SPIRONOLACTONE 25 MG PO TABS: 25.0000 mg | ORAL_TABLET | Freq: Every day | ORAL | 1 refills | Status: DC

## 2019-05-27 MED ORDER — SERTRALINE HCL 25 MG PO TABS: 25.0000 mg | ORAL_TABLET | Freq: Every day | ORAL | 1 refills | Status: DC

## 2019-05-27 MED ORDER — PRAVASTATIN SODIUM 20 MG PO TABS: 20.0000 mg | ORAL_TABLET | Freq: Every day | ORAL | 1 refills | Status: DC

## 2019-05-27 NOTE — Patient Instructions (Addendum)
I refilled the alfuzosin. This is for enlarged prostate. We will try to get you an appointment with urolgist ( Dr. Junious Silk) for follow up appt. [id number]  Bring all meds to visit in 6 weeks. If any difference in home meds compared to list in office - let me know. We will check labs next visit.   See information on constipation. Make sure to drink plenty of water and fiber in diet. citrucel is an over the counter fiber supplement if needed. Stop supplement if any diarrhea.    Constipation, Adult Constipation is when a person:  Poops (has a bowel movement) fewer times in a week than normal.  Has a hard time pooping.  Has poop that is dry, hard, or bigger than normal. Follow these instructions at home: Eating and drinking   Eat foods that have a lot of fiber, such as: ? Fresh fruits and vegetables. ? Whole grains. ? Beans.  Eat less of foods that are high in fat, low in fiber, or overly processed, such as: ? [place] fries. ? Hamburgers. ? Cookies. ? Candy. ? Soda.  Drink enough fluid to keep your pee (urine) clear or pale yellow. General instructions  Exercise regularly or as told by your doctor.  Go to the restroom when you feel like you need to poop. Do not hold it in.  Take over-the-counter and prescription medicines only as told by your doctor. These include any fiber supplements.  Do pelvic floor retraining exercises, such as: ? Doing deep breathing while relaxing your lower belly (abdomen). ? Relaxing your pelvic floor while pooping.  Watch your condition for any changes.  Keep all follow-up visits as told by your doctor. This is important. Contact a doctor if:  You have pain that gets worse.  You have a fever.  You have not pooped for 4 days.  You throw up (vomit).  You are not hungry.  You lose weight.  You are bleeding from the anus.  You have thin, pencil-like poop (stool). Get help right away if:  You have a fever, and your  symptoms suddenly get worse.  You leak poop or have blood in your poop.  Your belly feels hard or bigger than normal (is bloated).  You have very bad belly pain.  You feel dizzy or you faint. This information is not intended to replace advice given to you by your health care provider. Make sure you discuss any questions you have with your health care provider. Document Released: 01/23/2008 Document Revised: 07/19/2017 Document Reviewed: 01/25/2016 Elsevier Patient Education  [employer].   If you have lab work done today you will be contacted with your lab results within the next 2 weeks.  If you have not heard from [place] then please contact us. The fastest way to get your results is to register for My Chart.   IF you received an x-ray today, you will receive an invoice from [facility]. Please contact [facility] at [phone] with questions or concerns regarding your invoice.   IF you received labwork today, you will receive an invoice from [facility]. Please contact [facility] at [phone] with questions or concerns regarding your invoice.   Our billing staff will not be able to assist you with questions regarding bills from these companies.  You will be contacted with the lab results as soon as they are available. The fastest way to get your results is to activate your My Chart account. Instructions are located on the last page of  this paperwork. If you have not heard from [place] regarding the results in 2 weeks, please contact this office.

## 2019-05-27 NOTE — Progress Notes (Signed)
Subjective:    Patient ID: [name], male    DOB: 01-17-1946, 73 y.o.   MRN: [id number]  HPI [name] is a 73 y.o. male Presents today for: Chief Complaint  Patient presents with  . chronic medical condition     7 month f/u    Difficulty hearing during visit. Wearing hearing aids, but still had to speak loudly with repeat of questions multiple times - eventual understanding expressed.   History of falls: Discussed in January, then March telemed.  referred for home health eval. Use of cane and fall prevention discussed. Doing better at March visit.  Denied food insecurity or recent fall at that time.  No recent falls.  Has not had any recent falls.  Eating and drinking ok, sufficient access to food.   Anemia: Followed by Dr. Benay Spice for iron deficiency anemia.   Lab Results  Component Value Date   WBC 2.7 (L) 03/30/2019   HGB 11.1 (L) 03/30/2019   HCT 32.9 (L) 03/30/2019   MCV 92.7 03/30/2019   PLT 58 (L) 03/30/2019   Narcolepsy: Dr. Brett Fairy, Adderall XR 3m QD  Memory loss: followed by neuro above for mild congnitive impairment. appt in October.  Aricept 136mqd.    Depression:  [facility] 05/27/2019 03/09/2019 03/09/2019 03/03/2019 10/16/2018  Decreased Interest 0 0 1 1 0  Down, Depressed, Hopeless 0 0 1 0 0  PHQ - 2 Score 0 0 2 1 0  Altered sleeping - 0 0 - -  Tired, decreased energy - 0 1 - -  Change in appetite - 0 0 - -  Feeling bad or failure about yourself  - 0 0 - -  Trouble concentrating - - - - -  Moving slowly or fidgety/restless - 0 1 - -  Suicidal thoughts - 0 0 - -  PHQ-9 Score - 0 4 - -  Difficult doing work/chores - - - - -  Some recent data might be hidden  zoloft 2543md for mood. Unsure if taking.    BPH: Urology Dr. EskJunious Silkppt in 11/2017. Plan on 1 year follow up. Continued on alfuzosin, not sure if taking alfuzosin. Did not bring meds today - some concern in past what meds he was taking.  Some  frequent urination at times, nocturia 2 times per night.  No dysuria No fever.   Diabetes:  Stable in July. Prior relative low readings. Diet control only at this time.  Microalbumin: normal ratio in 05/2018. .  [name] Kitchenptho, foot exam, pneumovax: Up to date.  On statin.   Lab Results  Component Value Date   HGBA1C 6.0 (H) 03/09/2019   HGBA1C 4.8 07/30/2018   HGBA1C 5.4 04/04/2018   Lab Results  Component Value Date   MICROALBUR 0.8 04/19/2016   LDLCALC 48 03/09/2019   CREATININE 1.28 (H) 03/30/2019   Wt Readings from Last 3 Encounters:  05/27/19 195 lb 12.8 oz (88.8 kg)  03/30/19 190 lb 3.2 oz (86.3 kg)  03/09/19 190 lb (86.2 kg)     Hyperlipidemia:  Lab Results  Component Value Date   CHOL 137 03/09/2019   HDL 76 03/09/2019   LDLCALC 48 03/09/2019   TRIG 65 03/09/2019   CHOLHDL 1.8 03/09/2019   Lab Results  Component Value Date   ALT 24 03/30/2019   AST 37 03/30/2019   ALKPHOS 118 03/30/2019   BILITOT 1.4 (H) 03/30/2019  pravastatin 29m76m. No new side effects.    Hx of  hepatocellular CA followed by oncology. OV 8/10. Clinical remission - recheck 3 months.  No abd pain. Stomach appears bigger at times, then smaller. Last BM this am. Somewhat hard stool. Sometimes few days b/t BM's. Not always hard stools - sometimes soft.   Patient Active Problem List   Diagnosis Date Noted  . Iron deficiency anemia 11/04/2018  . Orthostatic hypotension 08/06/2018  . Hypertension 08/06/2018  . Hyperlipidemia 08/06/2018  . Pancytopenia ([place]) 08/06/2018  . 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 Insufficient treatment with nasal CPAP 05/05/2018  . 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 Irregular heart beat 01/28/2018  . Noncompliance with CPAP treatment 08/14/2017  . Excessive daytime sleepiness 04/09/2017  . Snoring 04/09/2017  . Memory impairment of gradual onset 04/09/2017  . Aortic atherosclerosis ([place])  11/27/2016  . 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 Altered mental status 03/05/2013  . Respiratory failure, post-operative ([place]) 03/02/2013  . Hepatitis B 02/24/2013  . Cancer, hepatocellular ([place]) 02/10/2013  . Liver tumor-bleeding 01/15/2013  . Epidermal cyst 06/18/2012  . OSA (obstructive sleep apnea) 03/13/2012  . Abnormal leg movement 12/12/2011  . Blood pressure elevated 09/06/2011  . Diabetes mellitus ([place]) 09/06/2011  . 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 BPH (benign prostatic hyperplasia) 09/06/2011  . Diverticula of colon 09/06/2011   Past Medical History:  Diagnosis Date  . Cancer [facility])    hepatocellular cancer   . Diabetes mellitus without complication ([place])   . Dyspnea   . Hepatitis B   . Hyperlipidemia   . Hypertension   . Irregular heart beat 01/28/2018  . Narcolepsy    per office visit note of 08/2011   . Sleep apnea    cpap   Past Surgical History:  Procedure Laterality Date  . CHOLECYSTECTOMY N/A 01/28/2018   Procedure: LAPAROSCOPIC CHOLECYSTECTOMY;  Surgeon: [name], MD;  Location: [facility];  Service: General;  Laterality: N/A;  . COLONOSCOPY WITH PROPOFOL N/A 08/27/2017   Procedure: COLONOSCOPY WITH PROPOFOL;  Surgeon: Clarene Essex, MD;  Location: [facility];  Service: Endoscopy;  Laterality: N/A;  . HOT HEMOSTASIS N/A 08/27/2017   Procedure: HOT HEMOSTASIS (ARGON PLASMA COAGULATION/BICAP);  Surgeon: Clarene Essex, MD;  Location: [facility];  Service: Endoscopy;  Laterality: N/A;  . LAPAROSCOPIC CHOLECYSTECTOMY  01/28/2018  . LAPAROSCOPY  03/02/2013   Procedure: LAPAROSCOPY DIAGNOSTIC;  Surgeon: [name], MD;  Location: [facility];  Service: General;;  . LIVER ULTRASOUND  03/02/2013   Procedure: LIVER ULTRASOUND;  Surgeon: [name], MD;  Location: [facility];  Service: General;;  . OPEN PARTIAL HEPATECTOMY   03/02/2013   Procedure: OPEN PARTIAL HEPATECTOMY [83];  Surgeon: [name], MD;  Location: [facility];  Service: General;;  DX  LAPAROSCOPY, INTRAOPERATIVE LIVER ULTRASOUND, OPEN PARTIAL HEPATECTOMY  . pinched nerve in back     Allergies  Allergen Reactions  . Ace Inhibitors Swelling and Other (See Comments)    Angioedema - face.    Prior to Admission medications   Medication Sig Start Date End Date Taking? Authorizing Provider  ACCU-CHEK SOFTCLIX LANCETS lancets Once per day testing. 08/05/17  Yes Wendie Agreste, MD  alfuzosin (UROXATRAL) 10 MG 24 hr tablet Take 1 tablet (10 mg total) by mouth at bedtime. 06/11/18  Yes Wendie Agreste, MD  amphetamine-dextroamphetamine (ADDERALL XR) 30 MG 24 hr capsule Take daily by mouth with water 30 minutes before breakfast. 11/13/18  Yes Dohmeier, Asencion Partridge, MD  blood glucose meter kit and  supplies Dispense based on patient and insurance preference. Use up to four times daily as directed. (FOR ICD-9 250.00, 250.01). 04/25/17  Yes Wendie Agreste, MD  donepezil (ARICEPT) 10 MG tablet Take 1 tablet (10 mg total) by mouth at bedtime. 10/15/18  Yes Dohmeier, Asencion Partridge, MD  feeding supplement, ENSURE ENLIVE, (ENSURE ENLIVE) LIQD Take 237 mLs by mouth 2 (two) times daily between meals. 08/08/18  Yes Emokpae, Courage, MD  ferrous sulfate (KP FERROUS SULFATE) 325 (65 FE) MG tablet Take 1 tablet (325 mg total) by mouth 3 (three) times daily with meals. 08/08/18  Yes Emokpae, Courage, MD  glucose blood (ACCU-CHEK AVIVA PLUS) test strip 1 each by Other route as needed for other. Use as instructed - once per day testing for now. 07/30/18  Yes Wendie Agreste, MD  Multiple Vitamin (MULTIVITAMIN WITH MINERALS) TABS tablet Take 1 tablet by mouth daily. 08/09/18  Yes Emokpae, Courage, MD  pravastatin (PRAVACHOL) 20 MG tablet Take 1 tablet (20 mg total) by mouth at bedtime. 06/11/18  Yes Wendie Agreste, MD  psyllium (METAMUCIL) 58.6 % powder Take 1 packet by mouth daily. Member purchased over-the-counter 09/22/18  Yes [provider]  sertraline (ZOLOFT) 25 MG tablet Take 1 tablet (25 mg  total) by mouth daily. 07/30/18  Yes Wendie Agreste, MD  spironolactone (ALDACTONE) 25 MG tablet Take 1 tablet (25 mg total) by mouth daily. 08/08/18  Yes Roxan Hockey, MD   Social History   Socioeconomic History  . Marital status: Divorced    Spouse name: Not on file  . Number of children: 2  . Years of education: 15+  . Highest education level: Bachelor's degree (e.g., BA, AB, BS)  Occupational History  . Not on file  Social Needs  . Financial resource strain: Very hard  . Food insecurity    Worry: Sometimes true    Inability: Sometimes true  . Transportation needs    Medical: No    Non-medical: No  Tobacco Use  . Smoking status: Former Smoker    Years: 5.00    Types: Cigarettes  . Smokeless tobacco: Never Used  Substance and Sexual Activity  . Alcohol use: No    Frequency: Never  . Drug use: No  . Sexual activity: Not Currently  Lifestyle  . Physical activity    Days per week: 0 days    Minutes per session: 0 min  . Stress: Not at all  Relationships  . Social Herbalist on phone: Once a week    Gets together: Once a week    Attends religious service: 1 to 4 times per year    Active member of club or organization: No    Attends meetings of clubs or organizations: Never    Relationship status: Divorced  . Intimate partner violence    Fear of current or ex partner: Not on file    Emotionally abused: Not on file    Physically abused: Not on file    Forced sexual activity: Not on file  Other Topics Concern  . Not on file  Social History Narrative   Patient is single and lives alone- became widower when 1 child was 60 year old, divorced 2nd marriage   Has #2 grown children, not in area or involved in his life   Patient is retired.   Patient has a college education.   Patient is right-handed.   Patient drinks maybe two sodas daily.    Review of Systems Per  HPI;     Objective:   Physical Exam Vitals signs reviewed.  Constitutional:       Appearance: He is well-developed.  HENT:     Head: Normocephalic and atraumatic.  Eyes:     Pupils: Pupils are equal, round, and reactive to light.  Neck:     Vascular: No carotid bruit or JVD.  Cardiovascular:     Rate and Rhythm: Normal rate and regular rhythm.     Heart sounds: Normal heart sounds. No murmur.  Pulmonary:     Effort: Pulmonary effort is normal.     Breath sounds: Normal breath sounds. No rales.  Abdominal:     Tenderness: There is no abdominal tenderness.  Skin:    General: Skin is warm and dry.  Neurological:     Mental Status: He is alert and oriented to person, place, and time.  Psychiatric:        Mood and Affect: Mood normal.        Behavior: Behavior normal.    Vitals:   05/27/19 1415  BP: 127/66  Pulse: 78  Temp: 98.7 F (37.1 C)  TempSrc: Oral  SpO2: 97%  Weight: 195 lb 12.8 oz (88.8 kg)      Assessment & Plan:    [name] is a 73 y.o. male Constipation, unspecified constipation type Hard stool  -Preventative techniques discussed.  Handout given.  Need for prophylactic vaccination and inoculation against influenza - Plan: Flu Vaccine QUAD High Dose(Fluad)  Benign prostatic hyperplasia, unspecified whether lower urinary tract symptoms present - Plan: alfuzosin (UROXATRAL) 10 MG 24 hr tablet  -Meds refilled.  Plan for him to bring meds in office to verify doses and actual medications used at home, and follow-up with urology.  Anemia, unspecified type  -Followed by hematology/oncology.  Type 2 diabetes mellitus without complication, without long-term current use of insulin ([place]) - Plan: Microalbumin / creatinine urine ratio  -As above plan for an office review of medications to verify  Depression, unspecified depression type - Plan: sertraline (ZOLOFT) 25 MG tablet  Stable, continue Zoloft same dose.  Essential hypertension - Plan: spironolactone (ALDACTONE) 25 MG tablet  -Stable, refill Aldactone  Hyperlipidemia,  unspecified hyperlipidemia type - Plan: pravastatin (PRAVACHOL) 20 MG tablet  -Tolerating pravastatin continue same.  Meds ordered this encounter  Medications  . alfuzosin (UROXATRAL) 10 MG 24 hr tablet    Sig: Take 1 tablet (10 mg total) by mouth at bedtime.    Dispense:  90 tablet    Refill:  1  . sertraline (ZOLOFT) 25 MG tablet    Sig: Take 1 tablet (25 mg total) by mouth daily.    Dispense:  90 tablet    Refill:  1  . spironolactone (ALDACTONE) 25 MG tablet    Sig: Take 1 tablet (25 mg total) by mouth daily.    Dispense:  90 tablet    Refill:  1  . pravastatin (PRAVACHOL) 20 MG tablet    Sig: Take 1 tablet (20 mg total) by mouth at bedtime.    Dispense:  90 tablet    Refill:  1   Patient Instructions     I refilled the alfuzosin. This is for enlarged prostate. We will try to get you an appointment with urolgist ( Dr. Junious Silk) for follow up appt. [id number]:[phone]  Bring all meds to visit in 6 weeks. If any difference in home meds compared to list in office - let me know. We will check labs next visit.  See information on constipation. Make sure to drink plenty of water and fiber in diet. citrucel is an over the counter fiber supplement if needed. Stop supplement if any diarrhea.    Constipation, Adult Constipation is when a person:  Poops (has a bowel movement) fewer times in a week than normal.  Has a hard time pooping.  Has poop that is dry, hard, or bigger than normal. Follow these instructions at home: Eating and drinking   Eat foods that have a lot of fiber, such as: ? Fresh fruits and vegetables. ? Whole grains. ? Beans.  Eat less of foods that are high in fat, low in fiber, or overly processed, such as: ? [place] fries. ? Hamburgers. ? Cookies. ? Candy. ? Soda.  Drink enough fluid to keep your pee (urine) clear or pale yellow. General instructions  Exercise regularly or as told by your doctor.  Go to the restroom when you feel like you need  to poop. Do not hold it in.  Take over-the-counter and prescription medicines only as told by your doctor. These include any fiber supplements.  Do pelvic floor retraining exercises, such as: ? Doing deep breathing while relaxing your lower belly (abdomen). ? Relaxing your pelvic floor while pooping.  Watch your condition for any changes.  Keep all follow-up visits as told by your doctor. This is important. Contact a doctor if:  You have pain that gets worse.  You have a fever.  You have not pooped for 4 days.  You throw up (vomit).  You are not hungry.  You lose weight.  You are bleeding from the anus.  You have thin, pencil-like poop (stool). Get help right away if:  You have a fever, and your symptoms suddenly get worse.  You leak poop or have blood in your poop.  Your belly feels hard or bigger than normal (is bloated).  You have very bad belly pain.  You feel dizzy or you faint. This information is not intended to replace advice given to you by your health care provider. Make sure you discuss any questions you have with your health care provider. Document Released: 01/23/2008 Document Revised: 07/19/2017 Document Reviewed: 01/25/2016 Elsevier Patient Education  [employer].   If you have lab work done today you will be contacted with your lab results within the next 2 weeks.  If you have not heard from [place] then please contact us. The fastest way to get your results is to register for My Chart.   IF you received an x-ray today, you will receive an invoice from [facility]. Please contact [facility] at [phone] with questions or concerns regarding your invoice.   IF you received labwork today, you will receive an invoice from [facility]. Please contact [facility] at [phone] with questions or concerns regarding your invoice.   Our billing staff will not be able to assist you with questions regarding bills from these companies.  You  will be contacted with the lab results as soon as they are available. The fastest way to get your results is to activate your My Chart account. Instructions are located on the last page of this paperwork. If you have not heard from [place] regarding the results in 2 weeks, please contact this office.      Signed,   [name], MD [facility].  05/27/19 7:03 PM

## 2019-06-09 ENCOUNTER — Ambulatory Visit: Payer: Medicare Other | Admitting: Adult Health

## 2019-06-09 ENCOUNTER — Other Ambulatory Visit: Payer: Self-pay

## 2019-06-09 ENCOUNTER — Encounter: Payer: Self-pay | Admitting: Adult Health

## 2019-06-09 VITALS — BP 130/78 | HR 70 | Temp 97.5°F | Ht 69.0 in | Wt 192.0 lb

## 2019-06-09 DIAGNOSIS — G47411 Narcolepsy with cataplexy: Secondary | ICD-10-CM | POA: Diagnosis not present

## 2019-06-09 DIAGNOSIS — G3184 Mild cognitive impairment, so stated: Secondary | ICD-10-CM | POA: Diagnosis not present

## 2019-06-09 DIAGNOSIS — G47419 Narcolepsy without cataplexy: Secondary | ICD-10-CM

## 2019-06-09 MED ORDER — AMPHETAMINE-DEXTROAMPHET ER 30 MG PO CP24: ORAL_CAPSULE | ORAL | 0 refills | Status: DC

## 2019-06-09 MED ORDER — DONEPEZIL HCL 10 MG PO TABS: 10.0000 mg | ORAL_TABLET | Freq: Every day | ORAL | 3 refills | Status: DC

## 2019-06-09 NOTE — Progress Notes (Signed)
PATIENT: [name] DOB: 1945-11-24  REASON FOR VISIT: follow up HISTORY FROM: patient  HISTORY OF PRESENT ILLNESS: Today 06/09/19:  [name] is a 73 year old male with a history of memory disturbance and narcolepsy.  He returns today for follow-up.  The patient feels that his memory is better.  He lives at home alone.  He is able to complete his ADLs independently.  He operates a Teacher, music.  He prepares his own meals.  He states in regards to his narcolepsy he only takes Adderall on an as-needed basis.  He reports that cost is a factor therefore he does not take it daily.  The patient forgot his hearing aids today.  HISTORY  Today's 05 May 2018 and I have the pleasure of seeing [name]. 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  [name] is no longer able to use his CPAP in a meaningful way on his last 6 months download showed that he made a concerted effort over April 2019 after visit with Vaughan Browner.  However this did not last, the residual AHI when using CPAP was 3.0, he had very high air leaks, based on this compliance he cannot get new supplies, and feels CPAP is not in his prime interest. He appears confused and frail. He underwent cholecystectomy earlier this year, stated this ws unrelated to his liver disease.  His MMSE today was performed by RN Mardene [name], and suprisingly scored at higher point count than the last 2 tests.   This would fit  MCI as well as early dementia- a diagnosis I would favor.  He is taking Aricept regularly, he assured me.  He is only driving within his residential area to church or the supermarket, not on high ways, not at night, not in rain. I am concerned that he may forget he shouldn't drive. He hadn't been able to drive when he was diagnosed and  treated for narcolepsy over 10 years ago and had to retire from operating a fork  lift.    REVIEW OF SYSTEMS: Out of a complete 14 system review of symptoms, the patient complains only of the following symptoms, and all other reviewed systems are negative.  Epworth sleepiness score 10 fatigue severity score 49  ALLERGIES: Allergies  Allergen Reactions   Ace Inhibitors Swelling and Other (See Comments)    Angioedema - face.     HOME MEDICATIONS: Outpatient Medications Prior to Visit  Medication Sig Dispense Refill   ACCU-CHEK SOFTCLIX LANCETS lancets Once per day testing. 100 each 1   alfuzosin (UROXATRAL) 10 MG 24 hr tablet Take 1 tablet (10 mg total) by mouth at bedtime. 90 tablet 1   amphetamine-dextroamphetamine (ADDERALL XR) 30 MG 24 hr capsule Take daily by mouth with water 30 minutes before breakfast. 30 capsule 0   blood glucose meter kit and supplies Dispense based on patient and insurance preference. Use up to four times daily as directed. (FOR ICD-9 250.00, 250.01). 1 each 0   donepezil (ARICEPT) 10 MG tablet Take 1 tablet (10 mg total) by mouth at bedtime. 90 tablet 1   feeding supplement, ENSURE ENLIVE, (ENSURE ENLIVE) LIQD Take 237 mLs by mouth 2 (two) times daily between meals. 60 Bottle 12   ferrous sulfate (KP FERROUS SULFATE) 325 (65 FE) MG tablet Take 1 tablet (325 mg total) by mouth 3 (three) times daily with meals. 90 tablet 3   glucose blood (ACCU-CHEK AVIVA  PLUS) test strip 1 each by Other route as needed for other. Use as instructed - once per day testing for now. 100 each 1   Multiple Vitamin (MULTIVITAMIN WITH MINERALS) TABS tablet Take 1 tablet by mouth daily. 30 tablet 2   pravastatin (PRAVACHOL) 20 MG tablet Take 1 tablet (20 mg total) by mouth at bedtime. 90 tablet 1   psyllium (METAMUCIL) 58.6 % powder Take 1 packet by mouth daily. Member purchased over-the-counter     sertraline (ZOLOFT) 25 MG tablet Take 1 tablet (25 mg total) by mouth daily. 90 tablet 1   spironolactone (ALDACTONE) 25 MG tablet Take 1 tablet (25 mg total)  by mouth daily. 90 tablet 1   No facility-administered medications prior to visit.     PAST MEDICAL HISTORY: Past Medical History:  Diagnosis Date   Cancer ([place])    hepatocellular cancer    Diabetes mellitus without complication ([place])    Dyspnea    Hepatitis B    Hyperlipidemia    Hypertension    Irregular heart beat 01/28/2018   Narcolepsy    per office visit note of 08/2011    Sleep apnea    cpap    PAST SURGICAL HISTORY: Past Surgical History:  Procedure Laterality Date   CHOLECYSTECTOMY N/A 01/28/2018   Procedure: LAPAROSCOPIC CHOLECYSTECTOMY;  Surgeon: [name], MD;  Location: [facility];  Service: General;  Laterality: N/A;   COLONOSCOPY WITH PROPOFOL N/A 08/27/2017   Procedure: COLONOSCOPY WITH PROPOFOL;  Surgeon: Clarene Essex, MD;  Location: [facility];  Service: Endoscopy;  Laterality: N/A;   HOT HEMOSTASIS N/A 08/27/2017   Procedure: HOT HEMOSTASIS (ARGON PLASMA COAGULATION/BICAP);  Surgeon: Clarene Essex, MD;  Location: [facility];  Service: Endoscopy;  Laterality: N/A;   LAPAROSCOPIC CHOLECYSTECTOMY  01/28/2018   LAPAROSCOPY  03/02/2013   Procedure: LAPAROSCOPY DIAGNOSTIC;  Surgeon: [name], MD;  Location: [facility];  Service: General;;   LIVER ULTRASOUND  03/02/2013   Procedure: LIVER ULTRASOUND;  Surgeon: [name], MD;  Location: [facility];  Service: General;;   OPEN PARTIAL HEPATECTOMY   03/02/2013   Procedure: OPEN PARTIAL HEPATECTOMY [83];  Surgeon: [name], MD;  Location: [facility];  Service: General;;  DX LAPAROSCOPY, INTRAOPERATIVE LIVER ULTRASOUND, OPEN PARTIAL HEPATECTOMY   pinched nerve in back      FAMILY HISTORY: Family History  Problem Relation Age of Onset   Dementia Mother    Cancer Father    Diabetes Brother    Hypertension Brother    Cancer Brother     SOCIAL HISTORY: Social History   Socioeconomic History   Marital status: Divorced    Spouse name: Not on file   Number of children: 2   Years of education:  15+   Highest education level: Bachelor's degree (e.g., BA, AB, BS)  Occupational History   Not on file  Social Needs   Financial resource strain: Very hard   Food insecurity    Worry: Sometimes true    Inability: Sometimes true   Transportation needs    Medical: No    Non-medical: No  Tobacco Use   Smoking status: Former Smoker    Years: 5.00    Types: Cigarettes   Smokeless tobacco: Never Used  Substance and Sexual Activity   Alcohol use: No    Frequency: Never   Drug use: No   Sexual activity: Not Currently  Lifestyle   Physical activity    Days per week: 0 days    Minutes per session: 0 min  Stress: Not at all  Relationships   Social connections    Talks on phone: Once a week    Gets together: Once a week    Attends religious service: 1 to 4 times per year    Active member of club or organization: No    Attends meetings of clubs or organizations: Never    Relationship status: Divorced   Intimate partner violence    Fear of current or ex partner: Not on file    Emotionally abused: Not on file    Physically abused: Not on file    Forced sexual activity: Not on file  Other Topics Concern   Not on file  Social History Narrative   Patient is single and lives alone- became widower when 1 child was 10 year old, divorced 2nd marriage   Has #2 grown children, not in area or involved in his life   Patient is retired.   Patient has a college education.   Patient is right-handed.   Patient drinks maybe two sodas daily.      PHYSICAL EXAM  Vitals:   06/09/19 0827  BP: 130/78  Pulse: 70  Temp: (!) 97.5 F (36.4 C)  Weight: 192 lb (87.1 kg)  Height: _0  (1.753 m)   Body mass index is 28.35 kg/m.   MMSE - Mini Mental State Exam 06/09/2019 05/05/2018 10/30/2017  Not completed: Unable to complete - -  Orientation to time _1 Orientation to Place _2 Registration - 3 3  Attention/ Calculation - 4 3  Recall - 2 2  Language- name 2 objects  - 2 2  Language- repeat - 1 1  Language- follow 3 step command - 3 1  Language- read & follow direction - 0 1  Write a sentence - 1 1  Copy design - 1 1  Total score - 25 23     Generalized: Well developed, in no acute distress   Neurological examination  Mentation: Alert oriented to time, place, history taking. Follows all commands speech and language fluent Cranial nerve II-XII: Pupils were equal round reactive to light. Extraocular movements were full, visual field were full on confrontational test. . Head turning and shoulder shrug  were normal and symmetric.  Hard of hearing Motor: The motor testing reveals 5 over 5 strength of all 4 extremities. Good symmetric motor tone is noted throughout.  Sensory: Sensory testing is intact to soft touch on all 4 extremities. No evidence of extinction is noted.  Coordination: Cerebellar testing reveals good finger-nose-finger and heel-to-shin bilaterally.  Gait and station: Gait is normal.  Reflexes: Deep tendon reflexes are symmetric and normal bilaterally.   DIAGNOSTIC DATA (LABS, IMAGING, TESTING) - I reviewed patient records, labs, notes, testing and imaging myself where available.  Lab Results  Component Value Date   WBC 2.7 (L) 03/30/2019   HGB 11.1 (L) 03/30/2019   HCT 32.9 (L) 03/30/2019   MCV 92.7 03/30/2019   PLT 58 (L) 03/30/2019      Component Value Date/Time   NA 140 03/30/2019 0827   NA 144 03/09/2019 0919   NA 138 09/03/2016 0748   K 4.0 03/30/2019 0827   K 4.4 09/03/2016 0748   CL 106 03/30/2019 0827   CO2 23 03/30/2019 0827   CO2 19 (L) 09/03/2016 0748   GLUCOSE 230 (H) 03/30/2019 0827   GLUCOSE 174 (H) 09/03/2016 0748   BUN 9 03/30/2019 0827   BUN 9 03/09/2019 0919  BUN 13.2 09/03/2016 0748   CREATININE 1.28 (H) 03/30/2019 0827   CREATININE 1.1 09/03/2016 0748   CALCIUM 9.0 03/30/2019 0827   CALCIUM 9.4 09/03/2016 0748   PROT 7.2 03/30/2019 0827   PROT 7.1 03/09/2019 0919   PROT 7.8 03/03/2014 0754    ALBUMIN 3.3 (L) 03/30/2019 0827   ALBUMIN 3.5 (L) 03/09/2019 0919   ALBUMIN 3.7 03/03/2014 0754   AST 37 03/30/2019 0827   AST 36 (H) 03/03/2014 0754   ALT 24 03/30/2019 0827   ALT 32 03/03/2014 0754   ALKPHOS 118 03/30/2019 0827   ALKPHOS 77 03/03/2014 0754   BILITOT 1.4 (H) 03/30/2019 0827   BILITOT 0.75 03/03/2014 0754   GFRNONAA 55 (L) 03/30/2019 0827   GFRNONAA 82 12/14/2015 1337   GFRAA >60 03/30/2019 0827   GFRAA >89 12/14/2015 1337   Lab Results  Component Value Date   CHOL 137 03/09/2019   HDL 76 03/09/2019   LDLCALC 48 03/09/2019   TRIG 65 03/09/2019   CHOLHDL 1.8 03/09/2019   Lab Results  Component Value Date   HGBA1C 6.0 (H) 03/09/2019   Lab Results  Component Value Date   VITAMINB12 171 (L) 08/06/2018   Lab Results  Component Value Date   TSH 0.745 06/11/2018      ASSESSMENT AND PLAN 73 y.o. year old male  has a past medical history of Cancer ([place]), Diabetes mellitus without complication ([place]), Dyspnea, Hepatitis B, Hyperlipidemia, Hypertension, Irregular heart beat (01/28/2018), Narcolepsy, and Sleep apnea. here with:  1.  Memory disturbance 2.  Narcolepsy  We were unable to complete a memory test today due to his hearing.  He forgot his hearing aids.  We will continue to monitor the memory.  He will remain on Aricept. I will refill Adderall.  He is to take this on an as-needed basis for daytime sleepiness.  He is advised that if his symptoms worsen or he develops new symptoms he should let us know.  He will follow-up in 1 year or sooner if needed.    Ward Givens, MSN, NP-C 06/09/2019, 8:44 AM [facility] [address] [place], [place] [address] [phone]

## 2019-06-09 NOTE — Patient Instructions (Signed)
Your Plan:  Continue to monitor memory adderall as needed for daytime sleepiness If your symptoms worsen or you develop new symptoms please let us know.    Thank you for coming to see [place] at [facility]. I hope we have been able to provide you high quality care today.  You may receive a patient satisfaction survey over the next few weeks. We would appreciate your feedback and comments so that we may continue to improve ourselves and the health of our patients. '

## 2019-06-10 ENCOUNTER — Telehealth: Payer: Self-pay | Admitting: Family Medicine

## 2019-06-10 NOTE — Telephone Encounter (Signed)
[name] came in and is wanting to know if it is okay for him to have some teeth pulled from his dentist. He states that his dentist was asking him a series of questions and he didn't know how to answer them. I told him he may have to do a OV but justs wants me to ask the provider first.

## 2019-06-23 NOTE — Telephone Encounter (Signed)
Blood pressure appears to have been controlled recently, which is something they would look at at the dentist.  It depends on specific type of anesthesia as well.  Would recommend his dentist send specific information on planned procedure, as well as any specific questions he was being asked, then can determine if visit needed.  Thanks.

## 2019-06-23 NOTE — Telephone Encounter (Signed)
Please advise? Is there anything that would stop this pt from getting his teeth pulled?

## 2019-06-24 NOTE — Telephone Encounter (Signed)
Tried to call pt back for more information but there was no answer and no voice mail set up so will try to call again later.

## 2019-06-29 ENCOUNTER — Inpatient Hospital Stay: Payer: Medicare Other

## 2019-06-29 ENCOUNTER — Other Ambulatory Visit: Payer: Self-pay

## 2019-06-29 ENCOUNTER — Telehealth: Payer: Self-pay | Admitting: *Deleted

## 2019-06-29 ENCOUNTER — Encounter: Payer: Self-pay | Admitting: Nurse Practitioner

## 2019-06-29 ENCOUNTER — Inpatient Hospital Stay: Payer: Medicare Other | Attending: Oncology | Admitting: Nurse Practitioner

## 2019-06-29 VITALS — BP 135/60 | HR 71 | Temp 98.0°F | Resp 16 | Wt 193.0 lb

## 2019-06-29 DIAGNOSIS — D122 Benign neoplasm of ascending colon: Secondary | ICD-10-CM | POA: Diagnosis not present

## 2019-06-29 DIAGNOSIS — D123 Benign neoplasm of transverse colon: Secondary | ICD-10-CM | POA: Diagnosis not present

## 2019-06-29 DIAGNOSIS — D500 Iron deficiency anemia secondary to blood loss (chronic): Secondary | ICD-10-CM | POA: Diagnosis not present

## 2019-06-29 DIAGNOSIS — K5520 Angiodysplasia of colon without hemorrhage: Secondary | ICD-10-CM | POA: Insufficient documentation

## 2019-06-29 DIAGNOSIS — K648 Other hemorrhoids: Secondary | ICD-10-CM | POA: Insufficient documentation

## 2019-06-29 DIAGNOSIS — D124 Benign neoplasm of descending colon: Secondary | ICD-10-CM | POA: Insufficient documentation

## 2019-06-29 DIAGNOSIS — R911 Solitary pulmonary nodule: Secondary | ICD-10-CM | POA: Insufficient documentation

## 2019-06-29 DIAGNOSIS — D1779 Benign lipomatous neoplasm of other sites: Secondary | ICD-10-CM | POA: Diagnosis not present

## 2019-06-29 DIAGNOSIS — C220 Liver cell carcinoma: Secondary | ICD-10-CM | POA: Insufficient documentation

## 2019-06-29 DIAGNOSIS — K621 Rectal polyp: Secondary | ICD-10-CM | POA: Insufficient documentation

## 2019-06-29 DIAGNOSIS — K7460 Unspecified cirrhosis of liver: Secondary | ICD-10-CM | POA: Diagnosis not present

## 2019-06-29 DIAGNOSIS — E1165 Type 2 diabetes mellitus with hyperglycemia: Secondary | ICD-10-CM | POA: Insufficient documentation

## 2019-06-29 DIAGNOSIS — R599 Enlarged lymph nodes, unspecified: Secondary | ICD-10-CM | POA: Insufficient documentation

## 2019-06-29 DIAGNOSIS — K5730 Diverticulosis of large intestine without perforation or abscess without bleeding: Secondary | ICD-10-CM | POA: Diagnosis not present

## 2019-06-29 DIAGNOSIS — K644 Residual hemorrhoidal skin tags: Secondary | ICD-10-CM | POA: Insufficient documentation

## 2019-06-29 DIAGNOSIS — K922 Gastrointestinal hemorrhage, unspecified: Secondary | ICD-10-CM | POA: Diagnosis not present

## 2019-06-29 DIAGNOSIS — D61818 Other pancytopenia: Secondary | ICD-10-CM | POA: Diagnosis not present

## 2019-06-29 DIAGNOSIS — Z79899 Other long term (current) drug therapy: Secondary | ICD-10-CM | POA: Insufficient documentation

## 2019-06-29 LAB — CMP (CANCER CENTER ONLY)
ALT: 23 U/L (ref 0–44)
AST: 31 U/L (ref 15–41)
Albumin: 3.3 g/dL — ABNORMAL LOW (ref 3.5–5.0)
Alkaline Phosphatase: 126 U/L (ref 38–126)
Anion gap: 11 (ref 5–15)
BUN: 7 mg/dL — ABNORMAL LOW (ref 8–23)
CO2: 23 mmol/L (ref 22–32)
Calcium: 8.7 mg/dL — ABNORMAL LOW (ref 8.9–10.3)
Chloride: 102 mmol/L (ref 98–111)
Creatinine: 1.17 mg/dL (ref 0.61–1.24)
GFR, Est AFR Am: >60 mL/min (ref >60)
GFR, Estimated: >60 mL/min (ref >60)
Glucose, Bld: 381 mg/dL — ABNORMAL HIGH (ref 70–99)
Potassium: 3.8 mmol/L (ref 3.5–5.1)
Sodium: 136 mmol/L (ref 135–145)
Total Bilirubin: 1.4 mg/dL — ABNORMAL HIGH (ref 0.3–1.2)
Total Protein: 7.4 g/dL (ref 6.5–8.1)

## 2019-06-29 LAB — CBC WITH DIFFERENTIAL (CANCER CENTER ONLY)
Abs Immature Granulocytes: 0.01 10*3/uL (ref 0.00–0.07)
Basophils Absolute: 0 10*3/uL (ref 0.0–0.1)
Basophils Relative: 1 %
Eosinophils Absolute: 0 10*3/uL (ref 0.0–0.5)
Eosinophils Relative: 1 %
HCT: 32.9 % — ABNORMAL LOW (ref 39.0–52.0)
Hemoglobin: 10.9 g/dL — ABNORMAL LOW (ref 13.0–17.0)
Immature Granulocytes: 0 %
Lymphocytes Relative: 17 %
Lymphs Abs: 0.5 10*3/uL — ABNORMAL LOW (ref 0.7–4.0)
MCH: 30.3 pg (ref 26.0–34.0)
MCHC: 33.1 g/dL (ref 30.0–36.0)
MCV: 91.4 fL (ref 80.0–100.0)
Monocytes Absolute: 0.2 10*3/uL (ref 0.1–1.0)
Monocytes Relative: 8 %
Neutro Abs: 2.2 10*3/uL (ref 1.7–7.7)
Neutrophils Relative %: 73 %
Platelet Count: 68 10*3/uL — ABNORMAL LOW (ref 150–400)
RBC: 3.6 MIL/uL — ABNORMAL LOW (ref 4.22–5.81)
RDW: 13.4 % (ref 11.5–15.5)
WBC Count: 3 10*3/uL — ABNORMAL LOW (ref 4.0–10.5)
nRBC: 0 % (ref 0.0–0.2)

## 2019-06-29 LAB — FERRITIN: Ferritin: 49 ng/mL (ref 24–336)

## 2019-06-29 NOTE — Progress Notes (Signed)
[facility] OFFICE PROGRESS NOTE   Diagnosis: Hepatocellular carcinoma, anemia  INTERVAL HISTORY:   [name] returns as scheduled.  Overall he feels well.  He is taking iron pills.  He denies abdominal pain.  Bowels moving regularly with Metamucil.  We discussed his elevated blood sugar from labs today.  He initially reported taking 2 diabetes medications.  He then reported he is not taking any medication for diabetes.  He does not check blood sugars routinely at home.  He is not following a diabetic diet.  Objective:  Vital signs in last 24 hours:  Blood pressure 135/60, pulse 71, temperature 98 F (36.7 C), temperature source Temporal, resp. rate 16, weight 193 lb (87.5 kg), SpO2 100 %.    HEENT: No thrush or ulcers. GI: Abdomen soft and nontender.  No hepatosplenomegaly. Vascular: No leg edema.   Lab Results:  Lab Results  Component Value Date   WBC 3.0 (L) 06/29/2019   HGB 10.9 (L) 06/29/2019   HCT 32.9 (L) 06/29/2019   MCV 91.4 06/29/2019   PLT 68 (L) 06/29/2019   NEUTROABS 2.2 06/29/2019    Imaging:  No results found.  Medications: I have reviewed the patient's current medications.  Assessment/Plan: 1. Hepatocellular carcinoma, stage I (T1 NX) status post partial left hepatectomy 03/02/2013.  08/31/2013 AFP 2.4.   CT abdomen 08/31/2013 with postoperative changes of partial hepatectomy with small postoperative fluid collection along the resection margin. No definite signs to suggest residual or locally recurrent disease. Several borderline enlarged and minimally enlarged lymph nodes superior to the liver in the juxtapericardiac fat of the lower anterior mediastinum with the largest measuring 11 mm slightly increased compared to the prior study 01/15/2013. Several prominent non-pathologically enlarged upper abdominal ligament lymph nodes similar to the prior study. Small esophageal varices.   CT abdomen 03/03/2012 without evidence of  recurrent hepatocellular carcinoma, stable right lung base nodule  CT the abdomen and pelvis 09/06/2014 without evidence of recurrent hepatocellular carcinoma, slight enlargement of pre-cardiac lymph nodes  CT abdomen/pelvis 09/07/2015 with no findings for recurrent tumor or metastatic disease. Stable small 3 mm right middle lobe pulmonary nodule since 2014. Epicardial lymph node measured 11 mm, previously 13 mm. Stable cirrhotic changes. Fairly significant esophageal varices.  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 Cirrhosis. 3. Hepatitis B core antibody positive. 4. Diabetes. 5. Severe microcytic anemia-likely iron deficiency anemia-progressive 06/03/2017. Stool Hemoccult positive 3 06/05/2017.  6. Colonoscopy 12/27/2016-external and internal hemorrhoids. Diverticulosis in the sigmoid, descending and transverse colon. One medium polyp in the distal descending colon. 3 small polyps in the transverse colon. 2 diminutive polyps in the rectum and the proximal ascending colon. Medium-sized lipoma transverse colon. Small lipoma mid ascending colon. Multiplenonbleeding colonic angiodysplastic lesions. Pathology-tubular adenomas, hyperplastic polyps. 7. Pancytopenia secondary to cirrhosis and GI bleeding 8. Laparoscopic cholecystectomy 01/28/2018 9. Decreased iron stores 10/15/2018; transfused 2 units of blood 10/18/2018; Feraheme 11/04/2018 and 11/11/2018   Disposition: [name] appears unchanged.  We reviewed the CBC from today.  Hemoglobin is stable.  He will continue oral iron.  He remains in clinical remission from hepatocellular carcinoma.  We will follow-up on the AFP from today.  We are referring him for CTs in 3 months.  Blood sugar today is markedly  elevated.  We are contacting Dr. Rolly Salter office for an appointment this week.  I discussed good hydration, avoiding concentrated sweets and the importance of checking his blood sugar.  He will return for lab and follow-up in 3 months, CT scan a few days prior.  He will contact the office in the interim with any problems.  Plan reviewed with Dr. Benay Spice.  20 minutes were spent face-to-face at today's visit with the majority of that time involved in counseling/coordination of care.    Ned Card ANP/GNP-BC   06/29/2019  8:41 AM

## 2019-06-29 NOTE — Telephone Encounter (Signed)
Per Ned Card NP, called pt PCP to make MD aware of pt CBG 381. PCP office agreed to see pt 11/13 for f/u appt. Pt made aware verbalized understanding of new appt time and date with PCP.

## 2019-06-30 LAB — AFP TUMOR MARKER: AFP, Serum, Tumor Marker: 2.1 ng/mL (ref 0.0–8.3)

## 2019-07-03 ENCOUNTER — Other Ambulatory Visit: Payer: Self-pay

## 2019-07-03 ENCOUNTER — Encounter: Payer: Self-pay | Admitting: Family Medicine

## 2019-07-03 ENCOUNTER — Ambulatory Visit (INDEPENDENT_AMBULATORY_CARE_PROVIDER_SITE_OTHER): Payer: Medicare Other | Admitting: Family Medicine

## 2019-07-03 VITALS — BP 130/66 | HR 88 | Temp 98.7°F | Ht 69.0 in | Wt 191.4 lb

## 2019-07-03 DIAGNOSIS — R5383 Other fatigue: Secondary | ICD-10-CM

## 2019-07-03 DIAGNOSIS — E1165 Type 2 diabetes mellitus with hyperglycemia: Secondary | ICD-10-CM | POA: Diagnosis not present

## 2019-07-03 LAB — POCT GLYCOSYLATED HEMOGLOBIN (HGB A1C): Hemoglobin A1C: 9.8 % — AB (ref 4.0–5.6)

## 2019-07-03 LAB — GLUCOSE, POCT (MANUAL RESULT ENTRY): POC Glucose: 266 mg/dl — AB (ref 70–99)

## 2019-07-03 MED ORDER — METFORMIN HCL 500 MG PO TABS: 500.0000 mg | ORAL_TABLET | Freq: Two times a day (BID) | ORAL | 1 refills | Status: DC

## 2019-07-03 NOTE — Patient Instructions (Addendum)
  Metformin restarted as your blood sugar is running too high again.  Take that twice per day and follow-up with me in 2 weeks.  If you are able to check your blood sugars at home, bring me a copy of those readings.  Checking it once per day is reasonable.  We can discuss fatigue further at next visit but elevated blood sugar may be contributing.  Return to the clinic or go to the nearest emergency room if any of your symptoms worsen or new symptoms occur.  Thank you for coming in today.    If you have lab work done today you will be contacted with your lab results within the next 2 weeks.  If you have not heard from [place] then please contact us. The fastest way to get your results is to register for My Chart.   IF you received an x-ray today, you will receive an invoice from [facility]. Please contact [facility] at [phone] with questions or concerns regarding your invoice.   IF you received labwork today, you will receive an invoice from [facility]. Please contact [facility] at [phone] with questions or concerns regarding your invoice.   Our billing staff will not be able to assist you with questions regarding bills from these companies.  You will be contacted with the lab results as soon as they are available. The fastest way to get your results is to activate your My Chart account. Instructions are located on the last page of this paperwork. If you have not heard from [place] regarding the results in 2 weeks, please contact this office.

## 2019-07-03 NOTE — Progress Notes (Signed)
Subjective:  Patient ID: [name], male    DOB: 02-19-1946  Age: 73 y.o. MRN: [id number]  CC:  Chief Complaint  Patient presents with  . Diabetes    f/u     HPI KOURTNEY MONTESINOS presents for   Diabetes: Has been well controlled in the past with risk of hypoglycemia, low A1c's in the 4.8 range last December, only minimally increased to 6.0 in July.  Discontinued medicines in the past due to risk for hypoglycemia. Recently has had elevated readings, CBG 381 noted at hematology/oncology.  Followed for history of hepatocellular carcinoma and anemia/pancytopenia.  Hemoglobin was stable at November 9 visit at 10.9. Previously treated with metformin, glipizide, Januvia in years past.   No recent home readings.  No n/v, no new abd pain.  No new urinary sx's, change in thirst or blurry vision.    Lab Results  Component Value Date   HGBA1C 6.0 (H) 03/09/2019   HGBA1C 4.8 07/30/2018   HGBA1C 5.4 04/04/2018   Lab Results  Component Value Date   MICROALBUR 0.8 04/19/2016   LDLCALC 48 03/09/2019   CREATININE 1.17 06/29/2019   History of cognitive impairment -memory disturbance and narcolepsy, treated by neurology, last appointment October 20.  Independent in ADLs at that time, was driving but just locally.  Taking Adderall as needed.  Forgot his hearing aids that visit.  Was taking Aricept, continue same.  Continue Adderall for as needed basis for daytime sleepiness.  He did receive assistance from [facility] earlier this year, as well as home health physical therapy.  Declines needs at home at this time.  Living by self, performs own LDL's.  Stays in bed all day at times. Just does not feel liek getting out of bed at times, but onlyat times. Does some house work, Freight forwarder yard at times. Denies depression/depressed mood.  Has someone call to check on him every now and then, and has meals delivered to him.  Not taking adderall regularly due to cost.  Not using  pill minder/pill box.   History Patient Active Problem List   Diagnosis Date Noted  . Iron deficiency anemia 11/04/2018  . Orthostatic hypotension 08/06/2018  . Hypertension 08/06/2018  . Hyperlipidemia 08/06/2018  . Pancytopenia ([place]) 08/06/2018  . 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 Insufficient treatment with nasal CPAP 05/05/2018  . 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 Irregular heart beat 01/28/2018  . Noncompliance with CPAP treatment 08/14/2017  . Excessive daytime sleepiness 04/09/2017  . Snoring 04/09/2017  . Memory impairment of gradual onset 04/09/2017  . Aortic atherosclerosis ([place]) 11/27/2016  . 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 Altered mental status 03/05/2013  . Respiratory failure, post-operative ([place]) 03/02/2013  . Hepatitis B 02/24/2013  . Cancer, hepatocellular ([place]) 02/10/2013  . Liver tumor-bleeding 01/15/2013  . Epidermal cyst 06/18/2012  . OSA (obstructive sleep apnea) 03/13/2012  . Abnormal leg movement 12/12/2011  . Blood pressure elevated 09/06/2011  . Diabetes mellitus ([place]) 09/06/2011  . 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 BPH (benign prostatic hyperplasia) 09/06/2011  . Diverticula of colon 09/06/2011   Past Medical History:  Diagnosis Date  . Cancer [facility])    hepatocellular cancer   . Diabetes mellitus without complication ([place])   . Dyspnea   . Hepatitis B   . Hyperlipidemia   . Hypertension   . Irregular heart beat 01/28/2018  . Narcolepsy    per office visit  note of 08/2011   . Sleep apnea    cpap   Past Surgical History:  Procedure Laterality Date  . CHOLECYSTECTOMY N/A 01/28/2018   Procedure: LAPAROSCOPIC CHOLECYSTECTOMY;  Surgeon: [name], MD;  Location: [place];  Service: General;  Laterality: N/A;  . COLONOSCOPY WITH PROPOFOL N/A 08/27/2017   Procedure: COLONOSCOPY WITH PROPOFOL;  Surgeon: Clarene Essex, MD;  Location: [facility];   Service: Endoscopy;  Laterality: N/A;  . HOT HEMOSTASIS N/A 08/27/2017   Procedure: HOT HEMOSTASIS (ARGON PLASMA COAGULATION/BICAP);  Surgeon: Clarene Essex, MD;  Location: [facility];  Service: Endoscopy;  Laterality: N/A;  . LAPAROSCOPIC CHOLECYSTECTOMY  01/28/2018  . LAPAROSCOPY  03/02/2013   Procedure: LAPAROSCOPY DIAGNOSTIC;  Surgeon: [name], MD;  Location: [facility];  Service: General;;  . LIVER ULTRASOUND  03/02/2013   Procedure: LIVER ULTRASOUND;  Surgeon: [name], MD;  Location: [facility];  Service: General;;  . OPEN PARTIAL HEPATECTOMY   03/02/2013   Procedure: OPEN PARTIAL HEPATECTOMY [83];  Surgeon: [name], MD;  Location: [facility];  Service: General;;  DX LAPAROSCOPY, INTRAOPERATIVE LIVER ULTRASOUND, OPEN PARTIAL HEPATECTOMY  . pinched nerve in back     Allergies  Allergen Reactions  . Ace Inhibitors Swelling and Other (See Comments)    Angioedema - face.    Prior to Admission medications   Medication Sig Start Date End Date Taking? Authorizing Provider  alfuzosin (UROXATRAL) 10 MG 24 hr tablet Take 1 tablet (10 mg total) by mouth at bedtime. 05/27/19  Yes Wendie Agreste, MD  donepezil (ARICEPT) 10 MG tablet Take 1 tablet (10 mg total) by mouth at bedtime. 06/09/19  Yes Ward Givens, NP  ferrous sulfate (KP FERROUS SULFATE) 325 (65 FE) MG tablet Take 1 tablet (325 mg total) by mouth 3 (three) times daily with meals. 08/08/18  Yes Roxan Hockey, MD  Multiple Vitamin (MULTIVITAMIN WITH MINERALS) TABS tablet Take 1 tablet by mouth daily. 08/09/18  Yes Emokpae, Courage, MD  pravastatin (PRAVACHOL) 20 MG tablet Take 1 tablet (20 mg total) by mouth at bedtime. 05/27/19  Yes Wendie Agreste, MD  psyllium (METAMUCIL) 58.6 % powder Take 1 packet by mouth daily. Member purchased over-the-counter 09/22/18  Yes [provider]  sertraline (ZOLOFT) 25 MG tablet Take 1 tablet (25 mg total) by mouth daily. 05/27/19  Yes Wendie Agreste, MD  spironolactone (ALDACTONE) 25 MG  tablet Take 1 tablet (25 mg total) by mouth daily. 05/27/19  Yes Wendie Agreste, MD  ACCU-CHEK SOFTCLIX LANCETS lancets Once per day testing. Patient not taking: Reported on 07/03/2019 08/05/17   Wendie Agreste, MD  amphetamine-dextroamphetamine (ADDERALL XR) 30 MG 24 hr capsule Take daily PRN by mouth with water 30 minutes before breakfast. Patient not taking: Reported on 07/03/2019 06/09/19   Ward Givens, NP  blood glucose meter kit and supplies Dispense based on patient and insurance preference. Use up to four times daily as directed. (FOR ICD-9 250.00, 250.01). Patient not taking: Reported on 07/03/2019 04/25/17   Wendie Agreste, MD  feeding supplement, ENSURE ENLIVE, (ENSURE ENLIVE) LIQD Take 237 mLs by mouth 2 (two) times daily between meals. Patient not taking: Reported on 07/03/2019 08/08/18   Roxan Hockey, MD  glucose blood (ACCU-CHEK AVIVA PLUS) test strip 1 each by Other route as needed for other. Use as instructed - once per day testing for now. Patient not taking: Reported on 07/03/2019 07/30/18   Wendie Agreste, MD   Social History   Socioeconomic History  . Marital  status: Divorced    Spouse name: Not on file  . Number of children: 2  . Years of education: 15+  . Highest education level: Bachelor's degree (e.g., BA, AB, BS)  Occupational History  . Not on file  Social Needs  . Financial resource strain: Very hard  . Food insecurity    Worry: Sometimes true    Inability: Sometimes true  . Transportation needs    Medical: No    Non-medical: No  Tobacco Use  . Smoking status: Former Smoker    Years: 5.00    Types: Cigarettes  . Smokeless tobacco: Never Used  Substance and Sexual Activity  . Alcohol use: No    Frequency: Never  . Drug use: No  . Sexual activity: Not Currently  Lifestyle  . Physical activity    Days per week: 0 days    Minutes per session: 0 min  . Stress: Not at all  Relationships  . Social Herbalist on phone: Once  a week    Gets together: Once a week    Attends religious service: 1 to 4 times per year    Active member of club or organization: No    Attends meetings of clubs or organizations: Never    Relationship status: Divorced  . Intimate partner violence    Fear of current or ex partner: Not on file    Emotionally abused: Not on file    Physically abused: Not on file    Forced sexual activity: Not on file  Other Topics Concern  . Not on file  Social History Narrative   Patient is single and lives alone- became widower when 1 child was 15 year old, divorced 2nd marriage   Has #2 grown children, not in area or involved in his life   Patient is retired.   Patient has a college education.   Patient is right-handed.   Patient drinks maybe two sodas daily.    Review of Systems   Objective:   Vitals:   07/03/19 1446  BP: 130/66  Pulse: 88  Temp: 98.7 F (37.1 C)  TempSrc: Oral  SpO2: 97%  Weight: 191 lb 6.4 oz (86.8 kg)  Height: _0  (1.753 m)     Physical Exam Vitals signs reviewed.  Constitutional:      Appearance: He is well-developed.  HENT:     Head: Normocephalic and atraumatic.  Eyes:     Pupils: Pupils are equal, round, and reactive to light.  Neck:     Vascular: No carotid bruit or JVD.  Cardiovascular:     Rate and Rhythm: Normal rate and regular rhythm.     Heart sounds: Normal heart sounds. No murmur.  Pulmonary:     Effort: Pulmonary effort is normal.     Breath sounds: Normal breath sounds. No rales.  Skin:    General: Skin is warm and dry.  Neurological:     Mental Status: He is alert and oriented to person, place, and time.     Comments: Responding appropriately to questions. No distress        Assessment & Plan:  CLEAVE TERNES is a 73 y.o. male . Type 2 diabetes mellitus with hyperglycemia, without long-term current use of insulin (HCC) - Plan: POCT glucose (manual entry), POCT glycosylated hemoglobin (Hb A1C), metFORMIN (GLUCOPHAGE) 500  MG tablet  Fatigue, unspecified type - Plan: POCT glucose (manual entry)  Unfortunately his diabetes has started to worsen and now significant elevations.  Will restart medications, initially with Metformin 500 mg twice daily.  Potential side effects discussed.  Recheck in 2 weeks.  Intermittent episodes of fatigue at home may be multifactorial including his anemia, narcolepsy, depression although denies new depression symptoms, but most recently may be in part due to hyperglycemia.  Current meds may also be contributing.  Will discuss further at follow-up in 2 weeks, RTC precautions if worse symptoms sooner.  Denies any acute needs at home right now, consider social work eval if perceived needs on follow-up visit.  No orders of the defined types were placed in this encounter.  Patient Instructions    Metformin restarted as your blood sugar is running too high again.  Take that twice per day and follow-up with me in 2 weeks.  If you are able to check your blood sugars at home, bring me a copy of those readings.  Checking it once per day is reasonable.  We can discuss fatigue further at next visit but elevated blood sugar may be contributing.  Return to the clinic or go to the nearest emergency room if any of your symptoms worsen or new symptoms occur.  Thank you for coming in today.    If you have lab work done today you will be contacted with your lab results within the next 2 weeks.  If you have not heard from [place] then please contact us. The fastest way to get your results is to register for My Chart.   IF you received an x-ray today, you will receive an invoice from [facility]. Please contact [facility] at [phone] with questions or concerns regarding your invoice.   IF you received labwork today, you will receive an invoice from [place]. Please contact [facility] at [phone] with questions or concerns regarding your invoice.   Our billing staff will not be  able to assist you with questions regarding bills from these companies.  You will be contacted with the lab results as soon as they are available. The fastest way to get your results is to activate your My Chart account. Instructions are located on the last page of this paperwork. If you have not heard from [place] regarding the results in 2 weeks, please contact this office.         Signed, [name], MD [facility]

## 2019-07-08 ENCOUNTER — Ambulatory Visit: Payer: Medicare Other | Admitting: Family Medicine

## 2019-07-17 ENCOUNTER — Other Ambulatory Visit: Payer: Self-pay

## 2019-07-17 ENCOUNTER — Ambulatory Visit (INDEPENDENT_AMBULATORY_CARE_PROVIDER_SITE_OTHER): Payer: Medicare Other | Admitting: Family Medicine

## 2019-07-17 ENCOUNTER — Encounter: Payer: Self-pay | Admitting: Family Medicine

## 2019-07-17 VITALS — BP 128/67 | HR 68 | Temp 97.9°F | Wt 193.8 lb

## 2019-07-17 DIAGNOSIS — E1165 Type 2 diabetes mellitus with hyperglycemia: Secondary | ICD-10-CM | POA: Diagnosis not present

## 2019-07-17 DIAGNOSIS — R5383 Other fatigue: Secondary | ICD-10-CM

## 2019-07-17 LAB — GLUCOSE, POCT (MANUAL RESULT ENTRY): POC Glucose: 211 mg/dl — AB (ref 70–99)

## 2019-07-17 MED ORDER — BLOOD GLUCOSE METER KIT: PACK | 0 refills | Status: DC

## 2019-07-17 NOTE — Patient Instructions (Addendum)
Continue Metformin twice per day with meals.  Check blood sugar once per day either fasting in the morning or 2 hours after a meal.  Okay to just check once per day but bring a record of those to the next visit if you can.  Fatigue can be due to multiple causes including the recent elevated blood sugars, narcolepsy, anemia or your medications.  If that is worsening, please be seen sooner than next visit in 1 month. Return to the clinic or go to the nearest emergency room if any of your symptoms worsen or new symptoms occur.    Fatigue If you have fatigue, you feel tired all the time and have a lack of energy or a lack of motivation. Fatigue may make it difficult to start or complete tasks because of exhaustion. In general, occasional or mild fatigue is often a normal response to activity or life. However, long-lasting (chronic) or extreme fatigue may be a symptom of a medical condition. Follow these instructions at home: General instructions  Watch your fatigue for any changes.  Go to bed and get up at the same time every day.  Avoid fatigue by pacing yourself during the day and getting enough sleep at night.  Maintain a healthy weight. Medicines  Take over-the-counter and prescription medicines only as told by your health care provider.  Take a multivitamin, if told by your health care provider.  Do not use herbal or dietary supplements unless they are approved by your health care provider. Activity   Exercise regularly, as told by your health care provider.  Use or practice techniques to help you relax, such as yoga, tai chi, meditation, or massage therapy. Eating and drinking   Avoid heavy meals in the evening.  Eat a well-balanced diet, which includes lean proteins, whole grains, plenty of fruits and vegetables, and low-fat dairy products.  Avoid consuming too much caffeine.  Avoid the use of alcohol.  Drink enough fluid to keep your urine pale  yellow. Lifestyle  Change situations that cause you stress. Try to keep your work and personal schedule in balance.  Do not use any products that contain nicotine or tobacco, such as cigarettes and e-cigarettes. If you need help quitting, ask your health care provider.  Do not use drugs. Contact a health care provider if:  Your fatigue does not get better.  You have a fever.  You suddenly lose or gain weight.  You have headaches.  You have trouble falling asleep or sleeping through the night.  You feel angry, guilty, anxious, or sad.  You are unable to have a bowel movement (constipation).  Your skin is dry.  You have swelling in your legs or another part of your body. Get help right away if:  You feel confused.  Your vision is blurry.  You feel faint or you pass out.  You have a severe headache.  You have severe pain in your abdomen, your back, or the area between your waist and hips (pelvis).  You have chest pain, shortness of breath, or an irregular or fast heartbeat.  You are unable to urinate, or you urinate less than normal.  You have abnormal bleeding, such as bleeding from the rectum, vagina, nose, lungs, or nipples.  You vomit blood.  You have thoughts about hurting yourself or others. If you ever feel like you may hurt yourself or others, or have thoughts about taking your own life, get help right away. You can go to your nearest emergency department  or call:  Your local emergency services (911 in the [place]  A suicide crisis helpline, such as the [place] at 650-085-1058. This is open 24 hours a day. Summary  If you have fatigue, you feel tired all the time and have a lack of energy or a lack of motivation.  Fatigue may make it difficult to start or complete tasks because of exhaustion.  Long-lasting (chronic) or extreme fatigue may be a symptom of a medical condition.  Exercise regularly, as told by your health  care provider.  Change situations that cause you stress. Try to keep your work and personal schedule in balance. This information is not intended to replace advice given to you by your health care provider. Make sure you discuss any questions you have with your health care provider. Document Released: 06/03/2007 Document Revised: 11/27/2018 Document Reviewed: 05/01/2017 Elsevier Patient Education  [employer].    If you have lab work done today you will be contacted with your lab results within the next 2 weeks.  If you have not heard from [place] then please contact us. The fastest way to get your results is to register for My Chart.   IF you received an x-ray today, you will receive an invoice from [facility]. Please contact [facility] at [phone] with questions or concerns regarding your invoice.   IF you received labwork today, you will receive an invoice from [place]. Please contact [facility] at [phone] with questions or concerns regarding your invoice.   Our billing staff will not be able to assist you with questions regarding bills from these companies.  You will be contacted with the lab results as soon as they are available. The fastest way to get your results is to activate your My Chart account. Instructions are located on the last page of this paperwork. If you have not heard from [place] regarding the results in 2 weeks, please contact this office.

## 2019-07-17 NOTE — Progress Notes (Signed)
Subjective:  Patient ID: [name], male    DOB: [birth year]  Age: 73 y.o. MRN: [id number]  CC:  Chief Complaint  Patient presents with   Diabetes    f/u and having some fatigue thats been on/off for a while but not fatigued today    HPI [name] presents for   Diabetes: Complicated by hyperglycemia Restarted on medication last visit for increasing readings.  A1c up to 9.8 recently.  Restarted on Metformin 500 mg twice daily.  We did discuss fatigue last visit which was thought to be possibly multifactorial with narcolepsy, anemia, depression although denied recent depression symptoms, as well as hyperglycemia.  May also be related to some of the medication he is taking.  Taking metformin with meals - 2 times per day. No new side effects.  Has not been checking blood sugars - not sure where machine is.   Not having fatigue daily - depends on activity. About the same as last visit - every now and then feels tired. Past few months. Has been taking adderall for narcolepsy. No sleepiness when driving. Will not drive if fatigued.  Needs new handicap placard.  Fatigue with walking distances - anemia.    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   Insufficient treatment with nasal CPAP 05/05/2018   MCI (mild cognitive impairment) with memory loss 05/05/2018   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   Irregular heart beat 01/28/2018   Noncompliance with CPAP treatment 08/14/2017   Excessive daytime  sleepiness 04/09/2017   Snoring 04/09/2017   Memory impairment of gradual onset 04/09/2017   Aortic atherosclerosis ([place]) 11/27/2016   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 11/11/2013   Altered mental status 03/05/2013   Respiratory failure, post-operative ([place]) 03/02/2013   Hepatitis B 02/24/2013   Cancer, hepatocellular ([place]) 02/10/2013   Liver tumor-bleeding 01/15/2013   Epidermal cyst 06/18/2012   OSA (obstructive sleep apnea) 03/13/2012   Abnormal leg movement 12/12/2011   Blood pressure elevated 09/06/2011   Diabetes mellitus ([place]) 09/06/2011   Narcolepsy 09/06/2011   BPH (benign prostatic hyperplasia) 09/06/2011   Diverticula of colon 09/06/2011   Past Medical History:  Diagnosis Date   Cancer ([place])    hepatocellular cancer    Diabetes mellitus without complication ([place])    Dyspnea    Hepatitis B    Hyperlipidemia    Hypertension    Irregular heart beat 01/28/2018   Narcolepsy    per office visit note of 08/2011    Sleep apnea    cpap   Past Surgical History:  Procedure Laterality Date   CHOLECYSTECTOMY N/A 01/28/2018   Procedure: LAPAROSCOPIC CHOLECYSTECTOMY;  Surgeon: [name], MD;  Location: [facility];  Service: General;  Laterality: N/A;   COLONOSCOPY WITH PROPOFOL N/A 08/27/2017   Procedure: COLONOSCOPY WITH PROPOFOL;  Surgeon: Clarene Essex, MD;  [facility];  Service: Endoscopy;  Laterality: N/A;   HOT HEMOSTASIS N/A 08/27/2017   Procedure: HOT HEMOSTASIS (ARGON PLASMA COAGULATION/BICAP);  Surgeon: Clarene Essex,  MD;  [facility];  Service: Endoscopy;  Laterality: N/A;   LAPAROSCOPIC CHOLECYSTECTOMY  01/28/2018   LAPAROSCOPY  03/02/2013   Procedure: LAPAROSCOPY DIAGNOSTIC;  Surgeon: [name], MD;  [facility];  Service: General;;   LIVER ULTRASOUND  03/02/2013   Procedure: LIVER ULTRASOUND;  Surgeon: [name], MD;  [facility];  Service: General;;   OPEN PARTIAL HEPATECTOMY    03/02/2013   Procedure: OPEN PARTIAL HEPATECTOMY [83];  Surgeon: [name], MD;  [facility];  Service: General;;  DX LAPAROSCOPY, INTRAOPERATIVE LIVER ULTRASOUND, OPEN PARTIAL HEPATECTOMY   pinched nerve in back     Allergies  Allergen Reactions   Ace Inhibitors Swelling and Other (See Comments)    Angioedema - face.    Prior to Admission medications   Medication Sig Start Date End Date Taking? Authorizing Provider  ACCU-CHEK SOFTCLIX LANCETS lancets Once per day testing. 08/05/17  Yes Wendie Agreste, MD  alfuzosin (UROXATRAL) 10 MG 24 hr tablet Take 1 tablet (10 mg total) by mouth at bedtime. 05/27/19  Yes Wendie Agreste, MD  amphetamine-dextroamphetamine (ADDERALL XR) 30 MG 24 hr capsule Take daily PRN by mouth with water 30 minutes before breakfast. 06/09/19  Yes Ward Givens, NP  blood glucose meter kit and supplies Dispense based on patient and insurance preference. Use up to four times daily as directed. (FOR ICD-9 250.00, 250.01). 04/25/17  Yes Wendie Agreste, MD  donepezil (ARICEPT) 10 MG tablet Take 1 tablet (10 mg total) by mouth at bedtime. 06/09/19  Yes Ward Givens, NP  feeding supplement, ENSURE ENLIVE, (ENSURE ENLIVE) LIQD Take 237 mLs by mouth 2 (two) times daily between meals. 08/08/18  Yes Emokpae, Courage, MD  ferrous sulfate (KP FERROUS SULFATE) 325 (65 FE) MG tablet Take 1 tablet (325 mg total) by mouth 3 (three) times daily with meals. 08/08/18  Yes Emokpae, Courage, MD  glucose blood (ACCU-CHEK AVIVA PLUS) test strip 1 each by Other route as needed for other. Use as instructed - once per day testing for now. 07/30/18  Yes Wendie Agreste, MD  metFORMIN (GLUCOPHAGE) 500 MG tablet Take 1 tablet (500 mg total) by mouth 2 (two) times daily with a meal. 07/03/19  Yes Wendie Agreste, MD  Multiple Vitamin (MULTIVITAMIN WITH MINERALS) TABS tablet Take 1 tablet by mouth daily. 08/09/18  Yes Emokpae, Courage, MD  pravastatin (PRAVACHOL) 20 MG tablet Take 1  tablet (20 mg total) by mouth at bedtime. 05/27/19  Yes Wendie Agreste, MD  psyllium (METAMUCIL) 58.6 % powder Take 1 packet by mouth daily. Member purchased over-the-counter 09/22/18  Yes [provider]  sertraline (ZOLOFT) 25 MG tablet Take 1 tablet (25 mg total) by mouth daily. 05/27/19  Yes Wendie Agreste, MD  spironolactone (ALDACTONE) 25 MG tablet Take 1 tablet (25 mg total) by mouth daily. 05/27/19  Yes Wendie Agreste, MD   Social History   Socioeconomic History   Marital status: Divorced    Spouse name: Not on file   Number of children: 2   Years of education: 15+   Highest education level: Bachelor's degree (e.g., BA, AB, BS)  Occupational History   Not on file  Social Needs   Financial resource strain: Very hard   Food insecurity    Worry: Sometimes true    Inability: Sometimes true   Transportation needs    Medical: No    Non-medical: No  Tobacco Use   Smoking status: Former Smoker    Years:  5.00    Types: Cigarettes   Smokeless tobacco: Never Used  Substance and Sexual Activity   Alcohol use: No    Frequency: Never   Drug use: No   Sexual activity: Not Currently  Lifestyle   Physical activity    Days per week: 0 days    Minutes per session: 0 min   Stress: Not at all  Relationships   Social connections    Talks on phone: Once a week    Gets together: Once a week    Attends religious service: 1 to 4 times per year    Active member of club or organization: No    Attends meetings of clubs or organizations: Never    Relationship status: Divorced   Intimate partner violence    Fear of current or ex partner: Not on file    Emotionally abused: Not on file    Physically abused: Not on file    Forced sexual activity: Not on file  Other Topics Concern   Not on file  Social History Narrative   Patient is single and lives alone- became widower when 1 child was 82 year old, divorced 2nd marriage   Has #2 grown children, not in area  or involved in his life   Patient is retired.   Patient has a college education.   Patient is right-handed.   Patient drinks maybe two sodas daily.    Review of Systems  Constitutional: Positive for fatigue.  Respiratory: Negative for shortness of breath.   Cardiovascular: Negative for chest pain.  Gastrointestinal: Negative for abdominal pain and blood in stool.   Other per HPI.   Objective:   Vitals:   07/17/19 0834  BP: 128/67  Pulse: 68  Temp: 97.9 F (36.6 C)  TempSrc: Oral  SpO2: 99%  Weight: 193 lb 12.8 oz (87.9 kg)     Physical Exam Vitals signs reviewed.  Constitutional:      Appearance: He is well-developed.  HENT:     Head: Normocephalic and atraumatic.  Eyes:     Pupils: Pupils are equal, round, and reactive to light.  Neck:     Vascular: No carotid bruit or JVD.  Cardiovascular:     Rate and Rhythm: Normal rate and regular rhythm.     Heart sounds: Normal heart sounds. No murmur.  Pulmonary:     Effort: Pulmonary effort is normal.     Breath sounds: Normal breath sounds. No rales.  Skin:    General: Skin is warm and dry.  Neurological:     Mental Status: He is alert and oriented to person, place, and time.     Results for orders placed or performed in visit on 07/17/19  POCT glucose (manual entry)  Result Value Ref Range   POC Glucose 211 (A) 70 - 99 mg/dl    Assessment & Plan:  [name] is a 73 y.o. male . Type 2 diabetes mellitus with hyperglycemia, without long-term current use of insulin (HCC) - Plan: POCT glucose (manual entry), blood glucose meter kit and supplies  Fatigue, unspecified type  As previously discussed,  fatigue may be multifactorial with his history of narcolepsy, anemia/pancytopenia, and with elevated glucose from worsening diabetic control.  Reportedly he is taking Metformin twice per day with meals.  In office testing slightly elevated today.  Will provide new meter, have him monitor readings at home with  recheck in 1 month, sooner if worsening fatigue or other new symptoms.  ER/RTC precautions.  Understanding of plan expressed.    No orders of the defined types were placed in this encounter.  Patient Instructions    Continue Metformin twice per day with meals.  Check blood sugar once per day either fasting in the morning or 2 hours after a meal.  Okay to just check once per day but bring a record of those to the next visit if you can.  Fatigue can be due to multiple causes including the recent elevated blood sugars, narcolepsy, anemia or your medications.  If that is worsening, please be seen sooner than next visit in 1 month. Return to the clinic or go to the nearest emergency room if any of your symptoms worsen or new symptoms occur.    Fatigue If you have fatigue, you feel tired all the time and have a lack of energy or a lack of motivation. Fatigue may make it difficult to start or complete tasks because of exhaustion. In general, occasional or mild fatigue is often a normal response to activity or life. However, long-lasting (chronic) or extreme fatigue may be a symptom of a medical condition. Follow these instructions at home: General instructions  Watch your fatigue for any changes.  Go to bed and get up at the same time every day.  Avoid fatigue by pacing yourself during the day and getting enough sleep at night.  Maintain a healthy weight. Medicines  Take over-the-counter and prescription medicines only as told by your health care provider.  Take a multivitamin, if told by your health care provider.  Do not use herbal or dietary supplements unless they are approved by your health care provider. Activity   Exercise regularly, as told by your health care provider.  Use or practice techniques to help you relax, such as yoga, tai chi, meditation, or massage therapy. Eating and drinking   Avoid heavy meals in the evening.  Eat a well-balanced diet, which includes  lean proteins, whole grains, plenty of fruits and vegetables, and low-fat dairy products.  Avoid consuming too much caffeine.  Avoid the use of alcohol.  Drink enough fluid to keep your urine pale yellow. Lifestyle  Change situations that cause you stress. Try to keep your work and personal schedule in balance.  Do not use any products that contain nicotine or tobacco, such as cigarettes and e-cigarettes. If you need help quitting, ask your health care provider.  Do not use drugs. Contact a health care provider if:  Your fatigue does not get better.  You have a fever.  You suddenly lose or gain weight.  You have headaches.  You have trouble falling asleep or sleeping through the night.  You feel angry, guilty, anxious, or sad.  You are unable to have a bowel movement (constipation).  Your skin is dry.  You have swelling in your legs or another part of your body. Get help right away if:  You feel confused.  Your vision is blurry.  You feel faint or you pass out.  You have a severe headache.  You have severe pain in your abdomen, your back, or the area between your waist and hips (pelvis).  You have chest pain, shortness of breath, or an irregular or fast heartbeat.  You are unable to urinate, or you urinate less than normal.  You have abnormal bleeding, such as bleeding from the rectum, vagina, nose, lungs, or nipples.  You vomit blood.  You have thoughts about hurting yourself or others. If you ever feel like you may  hurt yourself or others, or have thoughts about taking your own life, get help right away. You can go to your nearest emergency department or call:  Your local emergency services (911 in the [place]  A suicide crisis helpline, such as the [place] at 628-029-6357. This is open 24 hours a day. Summary  If you have fatigue, you feel tired all the time and have a lack of energy or a lack of motivation.  Fatigue may  make it difficult to start or complete tasks because of exhaustion.  Long-lasting (chronic) or extreme fatigue may be a symptom of a medical condition.  Exercise regularly, as told by your health care provider.  Change situations that cause you stress. Try to keep your work and personal schedule in balance. This information is not intended to replace advice given to you by your health care provider. Make sure you discuss any questions you have with your health care provider. Document Released: 06/03/2007 Document Revised: 11/27/2018 Document Reviewed: 05/01/2017 Elsevier Patient Education  [employer].    If you have lab work done today you will be contacted with your lab results within the next 2 weeks.  If you have not heard from [place] then please contact us. The fastest way to get your results is to register for My Chart.   IF you received an x-ray today, you will receive an invoice from [facility]. Please contact [facility] at [phone] with questions or concerns regarding your invoice.   IF you received labwork today, you will receive an invoice from [facility]. Please contact [facility] at [phone] with questions or concerns regarding your invoice.   Our billing staff will not be able to assist you with questions regarding bills from these companies.  You will be contacted with the lab results as soon as they are available. The fastest way to get your results is to activate your My Chart account. Instructions are located on the last page of this paperwork. If you have not heard from [place] regarding the results in 2 weeks, please contact this office.          Signed, [name], MD [facility]

## 2019-07-26 ENCOUNTER — Encounter: Payer: Self-pay | Admitting: Family Medicine

## 2019-07-29 ENCOUNTER — Other Ambulatory Visit: Payer: Self-pay

## 2019-07-29 ENCOUNTER — Ambulatory Visit (INDEPENDENT_AMBULATORY_CARE_PROVIDER_SITE_OTHER): Payer: Medicare Other | Admitting: Otolaryngology

## 2019-07-29 VITALS — Temp 97.3°F

## 2019-07-29 DIAGNOSIS — H6123 Impacted cerumen, bilateral: Secondary | ICD-10-CM

## 2019-07-29 NOTE — Progress Notes (Signed)
HPI: [name] is a 73 y.o. 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  He is [facility] by [facility] to have his [facility].  He has had longstanding hearing loss in both ears.  He had sudden left ear SNHL over 6 years ago.  He had a hearing test at that time in 2014 that showed moderate severe hearing loss in the right ear and almost complete sensorineural hearing loss in the left ear.  He has a hearing aid for the right ear.  The left ear has severe hearing loss and is not really amenable to a hearing aid but would require an cochlear implant to improve any hearing in the left ear.  He has a hearing aid that he uses in the right ear..  Past Medical History:  Diagnosis Date  . Cancer [facility])    hepatocellular cancer   . Diabetes mellitus without complication ([place])   . Dyspnea   . Hepatitis B   . Hyperlipidemia   . Hypertension   . Irregular heart beat 01/28/2018  . Narcolepsy    per office visit note of 08/2011   . Sleep apnea    cpap   Past Surgical History:  Procedure Laterality Date  . CHOLECYSTECTOMY N/A 01/28/2018   Procedure: LAPAROSCOPIC CHOLECYSTECTOMY;  Surgeon: [name], MD;  Location: ;  Service: General;  Laterality: N/A;  . COLONOSCOPY WITH PROPOFOL N/A 08/27/2017   Procedure: COLONOSCOPY WITH PROPOFOL;  Surgeon: Clarene Essex, MD;  [facility];  Service: Endoscopy;  Laterality: N/A;  . HOT HEMOSTASIS N/A 08/27/2017   Procedure: HOT HEMOSTASIS (ARGON PLASMA COAGULATION/BICAP);  Surgeon: Clarene Essex, MD;  Location: [facility];  Service: Endoscopy;  Laterality: N/A;  . LAPAROSCOPIC CHOLECYSTECTOMY  01/28/2018  . LAPAROSCOPY  03/02/2013   Procedure: LAPAROSCOPY DIAGNOSTIC;  Surgeon: [name], MD;  [facility];  Service: General;;  . LIVER ULTRASOUND  03/02/2013   Procedure: LIVER ULTRASOUND;  Surgeon: [name], MD;  [facility];  Service: General;;  . OPEN PARTIAL HEPATECTOMY   03/02/2013    Procedure: OPEN PARTIAL HEPATECTOMY [83];  Surgeon: [name], MD;  [facility];  Service: General;;  DX LAPAROSCOPY, INTRAOPERATIVE LIVER ULTRASOUND, OPEN PARTIAL HEPATECTOMY  . pinched nerve in back     Social History   Socioeconomic History  . Marital status: Divorced    Spouse name: Not on file  . Number of children: 2  . Years of education: 15+  . Highest education level: Bachelor's degree (e.g., BA, AB, BS)  Occupational History  . Not on file  Social Needs  . Financial resource strain: Very hard  . Food insecurity    Worry: Sometimes true    Inability: Sometimes true  . Transportation needs    Medical: No    Non-medical: No  Tobacco Use  . Smoking status: Former Smoker    Years: 5.00    Types: Cigarettes  . Smokeless tobacco: Never Used  Substance and Sexual Activity  . Alcohol use: No    Frequency: Never  . Drug use: No  . Sexual activity: Not Currently  Lifestyle  . Physical activity    Days per week: 0 days    Minutes per session: 0 min  . Stress: Not at all  Relationships  . Social connections    Talks on phone: Once a week    Gets together: Once a week    Attends religious service: 1 to 4 times per year  Active member of club or organization: No    Attends meetings of clubs or organizations: Never    Relationship status: Divorced  Other Topics Concern  . Not on file  Social History Narrative   Patient is single and lives alone- became widower when 1 child was 16 year old, divorced 2nd marriage   Has #2 grown children, not in area or involved in his life   Patient is retired.   Patient has a college education.   Patient is right-handed.   Patient drinks maybe two sodas daily.   Family History  Problem Relation Age of Onset  . Dementia Mother   . Cancer Father   . Diabetes Brother   . Hypertension Brother   . Cancer Brother    Allergies  Allergen Reactions  . Ace Inhibitors Swelling and Other (See Comments)    Angioedema - face.     Prior to Admission medications   Medication Sig Start Date End Date Taking? Authorizing Provider  ACCU-CHEK SOFTCLIX LANCETS lancets Once per day testing. 08/05/17  Yes Wendie Agreste, MD  alfuzosin (UROXATRAL) 10 MG 24 hr tablet Take 1 tablet (10 mg total) by mouth at bedtime. 05/27/19  Yes Wendie Agreste, MD  amphetamine-dextroamphetamine (ADDERALL XR) 30 MG 24 hr capsule Take daily PRN by mouth with water 30 minutes before breakfast. 06/09/19  Yes Ward Givens, NP  blood glucose meter kit and supplies Dispense based on patient and insurance preference. Use once per day. 07/17/19  Yes Wendie Agreste, MD  donepezil (ARICEPT) 10 MG tablet Take 1 tablet (10 mg total) by mouth at bedtime. 06/09/19  Yes Ward Givens, NP  feeding supplement, ENSURE ENLIVE, (ENSURE ENLIVE) LIQD Take 237 mLs by mouth 2 (two) times daily between meals. 08/08/18  Yes Emokpae, Courage, MD  ferrous sulfate (KP FERROUS SULFATE) 325 (65 FE) MG tablet Take 1 tablet (325 mg total) by mouth 3 (three) times daily with meals. 08/08/18  Yes Emokpae, Courage, MD  glucose blood (ACCU-CHEK AVIVA PLUS) test strip 1 each by Other route as needed for other. Use as instructed - once per day testing for now. 07/30/18  Yes Wendie Agreste, MD  metFORMIN (GLUCOPHAGE) 500 MG tablet Take 1 tablet (500 mg total) by mouth 2 (two) times daily with a meal. 07/03/19  Yes Wendie Agreste, MD  Multiple Vitamin (MULTIVITAMIN WITH MINERALS) TABS tablet Take 1 tablet by mouth daily. 08/09/18  Yes Emokpae, Courage, MD  pravastatin (PRAVACHOL) 20 MG tablet Take 1 tablet (20 mg total) by mouth at bedtime. 05/27/19  Yes Wendie Agreste, MD  psyllium (METAMUCIL) 58.6 % powder Take 1 packet by mouth daily. Member purchased over-the-counter 09/22/18  Yes [provider]  sertraline (ZOLOFT) 25 MG tablet Take 1 tablet (25 mg total) by mouth daily. 05/27/19  Yes Wendie Agreste, MD  spironolactone (ALDACTONE) 25 MG tablet Take 1 tablet  (25 mg total) by mouth daily. 05/27/19  Yes Wendie Agreste, MD     Positive ROS: Otherwise negative no vertigo or headaches.  All other systems have been reviewed and were otherwise negative with the exception of those mentioned in the HPI and as above.  Physical Exam: Constitutional: Alert, well-appearing, no acute distress Ears: External ears without lesions or tenderness. Ear canals he has minimal wax buildup in the ears that was cleaned in the office the TMs are otherwise clear bilaterally.. Nasal: External nose without lesions. Clear nasal passages Oral: Oropharynx clear. Neck: No palpable adenopathy or  masses Respiratory: Breathing comfortably  Skin: No facial/neck lesions or rash noted.  Cerumen impaction removal  Date/Time: 07/29/2019 11:01 AM Performed by: Rozetta Nunnery, MD Authorized by: Rozetta Nunnery, MD   Consent:    Consent obtained:  Verbal   Consent given by:  Patient   Risks discussed:  Pain and bleeding Procedure details:    Location:  L ear and R ear   Procedure type: curette   Post-procedure details:    Inspection:  TM intact and canal normal   Hearing quality:  Improved   Patient tolerance of procedure:  Tolerated well, no immediate complications Comments:     Ear canals were cleaned bilaterally.  TMs were otherwise clear with good mobility on pneumatic otoscopy.    Assessment: Cerumen buildup. 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 Severe left ear sensorineural hearing loss.  Plan: Would recommend use of a hearing aid in the right ear.   The left ear would require cochlear implant in order to get any hearing. Suggested contacting Dr. Benjamine Mola here in [place] or contacting [facility] or [facility] to consider cochlear implant surgery.  Radene Journey, MD

## 2019-08-24 ENCOUNTER — Other Ambulatory Visit: Payer: Self-pay

## 2019-08-24 ENCOUNTER — Ambulatory Visit (INDEPENDENT_AMBULATORY_CARE_PROVIDER_SITE_OTHER): Payer: Medicare Other | Admitting: Family Medicine

## 2019-08-24 VITALS — BP 124/62 | HR 71 | Temp 98.0°F | Resp 16 | Ht 69.0 in | Wt 196.0 lb

## 2019-08-24 DIAGNOSIS — Z7189 Other specified counseling: Secondary | ICD-10-CM

## 2019-08-24 DIAGNOSIS — E1165 Type 2 diabetes mellitus with hyperglycemia: Secondary | ICD-10-CM

## 2019-08-24 LAB — GLUCOSE, POCT (MANUAL RESULT ENTRY): POC Glucose: 204 mg/dl — AB (ref 70–99)

## 2019-08-24 NOTE — Progress Notes (Signed)
Subjective:  Patient ID: [name], male    DOB: 1945-09-09  Age: 74 y.o. MRN: [id number]  CC:  Chief Complaint  Patient presents with  . Follow-up    check on [place]  . Fatigue    started 1 year ago. pt is sleeping well.random sleeping pattern.    HPI [name] presents for   Diabetes: Complicated by hyperglycemia.  Was started back on medication for increasing readings in November last year.  A1c had increased from 6.0 in July to 9.8 in November.  He was having fatigue that was thought to be multifactorial with narcolepsy, anemia, depression as well as hyperglycemia.  Follow-up November 27, Metformin 500 mg twice daily without any side effects. Glucose 211 on 11/27. Fatigue was only dependent on activity, and not daily.  Had some trouble with meter - has new one now.  Home readings: Taking once per day. 202 this morning, sometimes 150, low of 65. Thinks that diet has caused the spikes, but better on diet now.  No soda/sweet tea. Would like to meet with nutritionist.  Feels ok. No worsening fatigue.  Occasional missed dose of metformin - about 2 per week.   Microalbumin: due.  Optho, foot exam, pneumovax: up to date.   Lab Results  Component Value Date   HGBA1C 9.8 (A) 07/03/2019   HGBA1C 6.0 (H) 03/09/2019   HGBA1C 4.8 07/30/2018   Lab Results  Component Value Date   MICROALBUR 0.8 04/19/2016   LDLCALC 48 03/09/2019   CREATININE 1.17 06/29/2019   Counseling about Covid vaccine  Has option for vaccine at [facility].  Discussed his risk factors. Had some questions about different vaccines and risks of those vaccines.   History Patient Active Problem List   Diagnosis Date Noted  . Iron deficiency anemia 11/04/2018  . Orthostatic hypotension 08/06/2018  . Hypertension 08/06/2018  . Hyperlipidemia 08/06/2018  . Pancytopenia ([place]) 08/06/2018  . 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 Insufficient treatment with nasal CPAP 05/05/2018  .  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 Irregular heart beat 01/28/2018  . Noncompliance with CPAP treatment 08/14/2017  . Excessive daytime sleepiness 04/09/2017  . Snoring 04/09/2017  . Memory impairment of gradual onset 04/09/2017  . Aortic atherosclerosis ([place]) 11/27/2016  . 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 Altered mental status 03/05/2013  . Respiratory failure, post-operative ([place]) 03/02/2013  . Hepatitis B 02/24/2013  . Cancer, hepatocellular ([place]) 02/10/2013  . Liver tumor-bleeding 01/15/2013  . Epidermal cyst 06/18/2012  . OSA (obstructive sleep apnea) 03/13/2012  . Abnormal leg movement 12/12/2011  . Blood pressure elevated 09/06/2011  . Diabetes mellitus ([place]) 09/06/2011  . 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 BPH (benign prostatic hyperplasia) 09/06/2011  . Diverticula of colon 09/06/2011   Past Medical History:  Diagnosis Date  . Cancer [facility])    hepatocellular cancer   . Diabetes mellitus without complication ([place])   . Dyspnea   . Hepatitis B   . Hyperlipidemia   . Hypertension   . Irregular heart beat 01/28/2018  . Narcolepsy    per office visit note of 08/2011   . Sleep apnea    cpap   Past Surgical History:  Procedure Laterality Date  . CHOLECYSTECTOMY N/A 01/28/2018   Procedure: LAPAROSCOPIC CHOLECYSTECTOMY;  Surgeon: [name], MD;  Location: [place];  Service: General;  Laterality: N/A;  . COLONOSCOPY WITH PROPOFOL N/A 08/27/2017   Procedure: COLONOSCOPY WITH  PROPOFOL;  Surgeon: Clarene Essex, MD;  Location: [facility];  Service: Endoscopy;  Laterality: N/A;  . HOT HEMOSTASIS N/A 08/27/2017   Procedure: HOT HEMOSTASIS (ARGON PLASMA COAGULATION/BICAP);  Surgeon: Clarene Essex, MD;  Location: [facility];  Service: Endoscopy;  Laterality: N/A;  . LAPAROSCOPIC CHOLECYSTECTOMY  01/28/2018  . LAPAROSCOPY  03/02/2013   Procedure: LAPAROSCOPY DIAGNOSTIC;  Surgeon: [name],  MD;  Location: [facility];  Service: General;;  . LIVER ULTRASOUND  03/02/2013   Procedure: LIVER ULTRASOUND;  Surgeon: [name], MD;  Location: [facility];  Service: General;;  . OPEN PARTIAL HEPATECTOMY   03/02/2013   Procedure: OPEN PARTIAL HEPATECTOMY [83];  Surgeon: [name], MD;  Location: [facility];  Service: General;;  DX LAPAROSCOPY, INTRAOPERATIVE LIVER ULTRASOUND, OPEN PARTIAL HEPATECTOMY  . pinched nerve in back     Allergies  Allergen Reactions  . Ace Inhibitors Swelling and Other (See Comments)    Angioedema - face.    Prior to Admission medications   Medication Sig Start Date End Date Taking? Authorizing Provider  ACCU-CHEK SOFTCLIX LANCETS lancets Once per day testing. 08/05/17  Yes Wendie Agreste, MD  alfuzosin (UROXATRAL) 10 MG 24 hr tablet Take 1 tablet (10 mg total) by mouth at bedtime. 05/27/19  Yes Wendie Agreste, MD  amphetamine-dextroamphetamine (ADDERALL XR) 30 MG 24 hr capsule Take daily PRN by mouth with water 30 minutes before breakfast. 06/09/19  Yes Ward Givens, NP  blood glucose meter kit and supplies Dispense based on patient and insurance preference. Use once per day. 07/17/19  Yes Wendie Agreste, MD  donepezil (ARICEPT) 10 MG tablet Take 1 tablet (10 mg total) by mouth at bedtime. 06/09/19  Yes Ward Givens, NP  feeding supplement, ENSURE ENLIVE, (ENSURE ENLIVE) LIQD Take 237 mLs by mouth 2 (two) times daily between meals. 08/08/18  Yes Emokpae, Courage, MD  ferrous sulfate (KP FERROUS SULFATE) 325 (65 FE) MG tablet Take 1 tablet (325 mg total) by mouth 3 (three) times daily with meals. 08/08/18  Yes Emokpae, Courage, MD  glucose blood (ACCU-CHEK AVIVA PLUS) test strip 1 each by Other route as needed for other. Use as instructed - once per day testing for now. 07/30/18  Yes Wendie Agreste, MD  metFORMIN (GLUCOPHAGE) 500 MG tablet Take 1 tablet (500 mg total) by mouth 2 (two) times daily with a meal. 07/03/19  Yes Wendie Agreste, MD  Multiple  Vitamin (MULTIVITAMIN WITH MINERALS) TABS tablet Take 1 tablet by mouth daily. 08/09/18  Yes Emokpae, Courage, MD  pravastatin (PRAVACHOL) 20 MG tablet Take 1 tablet (20 mg total) by mouth at bedtime. 05/27/19  Yes Wendie Agreste, MD  psyllium (METAMUCIL) 58.6 % powder Take 1 packet by mouth daily. Member purchased over-the-counter 09/22/18  Yes [provider]  sertraline (ZOLOFT) 25 MG tablet Take 1 tablet (25 mg total) by mouth daily. 05/27/19  Yes Wendie Agreste, MD  spironolactone (ALDACTONE) 25 MG tablet Take 1 tablet (25 mg total) by mouth daily. 05/27/19  Yes Wendie Agreste, MD   Social History   Socioeconomic History  . Marital status: Divorced    Spouse name: Not on file  . Number of children: 2  . Years of education: 15+  . Highest education level: Bachelor's degree (e.g., BA, AB, BS)  Occupational History  . Not on file  Tobacco Use  . Smoking status: Former Smoker    Years: 5.00    Types: Cigarettes  . Smokeless tobacco: Never Used  Substance  and Sexual Activity  . Alcohol use: No  . Drug use: No  . Sexual activity: Not Currently  Other Topics Concern  . Not on file  Social History Narrative   Patient is single and lives alone- became widower when 1 child was 84 year old, divorced 2nd marriage   Has #2 grown children, not in area or involved in his life   Patient is retired.   Patient has a college education.   Patient is right-handed.   Patient drinks maybe two sodas daily.   Social Determinants of Health   Financial Resource Strain:   . Difficulty of Paying Living Expenses: Not on file  Food Insecurity:   . Worried About Charity fundraiser in the Last Year: Not on file  . Ran Out of Food in the Last Year: Not on file  Transportation Needs:   . Lack of Transportation (Medical): Not on file  . Lack of Transportation (Non-Medical): Not on file  Physical Activity:   . Days of Exercise per Week: Not on file  . Minutes of Exercise per Session: Not  on file  Stress:   . Feeling of Stress : Not on file  Social Connections:   . Frequency of Communication with Friends and Family: Not on file  . Frequency of Social Gatherings with Friends and Family: Not on file  . Attends Religious Services: Not on file  . Active Member of Clubs or Organizations: Not on file  . Attends Archivist Meetings: Not on file  . Marital Status: Not on file  Intimate Partner Violence:   . Fear of Current or Ex-Partner: Not on file  . Emotionally Abused: Not on file  . Physically Abused: Not on file  . Sexually Abused: Not on file    Review of Systems   Objective:   Vitals:   08/24/19 1042  BP: 124/62  Pulse: 71  Resp: 16  Temp: 98 F (36.7 C)  TempSrc: Temporal  SpO2: 99%  Weight: 196 lb (88.9 kg)  Height: _0  (1.753 m)     Physical Exam Vitals reviewed.  Constitutional:      Appearance: He is well-developed.  HENT:     Head: Normocephalic and atraumatic.  Eyes:     Pupils: Pupils are equal, round, and reactive to light.  Neck:     Vascular: No carotid bruit or JVD.  Cardiovascular:     Rate and Rhythm: Normal rate and regular rhythm.     Heart sounds: Normal heart sounds. No murmur.  Pulmonary:     Effort: Pulmonary effort is normal.     Breath sounds: Normal breath sounds. No rales.  Abdominal:     Tenderness: There is no abdominal tenderness.  Skin:    General: Skin is warm and dry.  Neurological:     Mental Status: He is alert and oriented to person, place, and time.     28 min visit with greater than 50% counseling.   Results for orders placed or performed in visit on 08/24/19  Microalbumin / creatinine urine ratio  Result Value Ref Range   Creatinine, Urine 172.0 Not Estab. mg/dL   Microalbumin, Urine 7.5 Not Estab. ug/mL   Microalb/Creat Ratio 4 0 - 29 mg/g creat  POCT glucose (manual entry)  Result Value Ref Range   POC Glucose 204 (A) 70 - 99 mg/dl     Assessment & Plan:  [name] is  a 74 y.o. male . Type 2 diabetes  mellitus with hyperglycemia, without long-term current use of insulin (HCC) - Plan: POCT glucose (manual entry), Microalbumin / creatinine urine ratio, Amb ref to Medical Nutrition Therapy-MNT  Counseled about COVID-19 virus infection  Fatigue stable, likely multifactorial as discussed previously.  Vital signs stable, nonfocal exam.  Suspect some variation in glycemic control due to diet.  Continue same dose Metformin for now, refer to nutritionist, handout given on diabetes and nutrition.  Pill minder also recommended for compliance with medication.  Recheck A1c in 1 month.   Discussed COVID-19 vaccine, potential concerns were addressed.  Handout given with links for further information.  No orders of the defined types were placed in this encounter.  Patient Instructions   I will refer you to nutritionist to talk about diet with diabetes.  Continue metformin twice per day - set alarm or use pill minder to help remember dosing.  See handout on covid vaccine questions, let me know if there are further questions.   Recheck in 1 month for diabetes.    Diabetes Mellitus and Nutrition, Adult When you have diabetes (diabetes mellitus), it is very important to have healthy eating habits because your blood sugar (glucose) levels are greatly affected by what you eat and drink. Eating healthy foods in the appropriate amounts, at about the same times every day, can help you:  Control your blood glucose.  Lower your risk of heart disease.  Improve your blood pressure.  Reach or maintain a healthy weight. Every person with diabetes is different, and each person has different needs for a meal plan. Your health care provider may recommend that you work with a diet and nutrition specialist (dietitian) to make a meal plan that is best for you. Your meal plan may vary depending on factors such as:  The calories you need.  The medicines you take.  Your  weight.  Your blood glucose, blood pressure, and cholesterol levels.  Your activity level.  Other health conditions you have, such as heart or kidney disease. How do carbohydrates affect me? Carbohydrates, also called carbs, affect your blood glucose level more than any other type of food. Eating carbs naturally raises the amount of glucose in your blood. Carb counting is a method for keeping track of how many carbs you eat. Counting carbs is important to keep your blood glucose at a healthy level, especially if you use insulin or take certain oral diabetes medicines. It is important to know how many carbs you can safely have in each meal. This is different for every person. Your dietitian can help you calculate how many carbs you should have at each meal and for each snack. Foods that contain carbs include:  Bread, cereal, rice, pasta, and crackers.  Potatoes and corn.  Peas, beans, and lentils.  Milk and yogurt.  Fruit and juice.  Desserts, such as cakes, cookies, ice cream, and candy. How does alcohol affect me? Alcohol can cause a sudden decrease in blood glucose (hypoglycemia), especially if you use insulin or take certain oral diabetes medicines. Hypoglycemia can be a life-threatening condition. Symptoms of hypoglycemia (sleepiness, dizziness, and confusion) are similar to symptoms of having too much alcohol. If your health care provider says that alcohol is safe for you, follow these guidelines:  Limit alcohol intake to no more than 1 drink per day for nonpregnant women and 2 drinks per day for men. One drink equals 12 oz of beer, 5 oz of wine, or 1 oz of hard liquor.  Do not drink on  an empty stomach.  Keep yourself hydrated with water, diet soda, or unsweetened iced tea.  Keep in mind that regular soda, juice, and other mixers may contain a lot of sugar and must be counted as carbs. What are tips for following this plan?  Reading food labels  Start by checking the  serving size on the "Nutrition Facts" label of packaged foods and drinks. The amount of calories, carbs, fats, and other nutrients listed on the label is based on one serving of the item. Many items contain more than one serving per package.  Check the total grams (g) of carbs in one serving. You can calculate the number of servings of carbs in one serving by dividing the total carbs by 15. For example, if a food has 30 g of total carbs, it would be equal to 2 servings of carbs.  Check the number of grams (g) of saturated and trans fats in one serving. Choose foods that have low or no amount of these fats.  Check the number of milligrams (mg) of salt (sodium) in one serving. Most people should limit total sodium intake to less than 2,300 mg per day.  Always check the nutrition information of foods labeled as "low-fat" or "nonfat". These foods may be higher in added sugar or refined carbs and should be avoided.  Talk to your dietitian to identify your daily goals for nutrients listed on the label. Shopping  Avoid buying canned, premade, or processed foods. These foods tend to be high in fat, sodium, and added sugar.  Shop around the outside edge of the grocery store. This includes fresh fruits and vegetables, bulk grains, fresh meats, and fresh dairy. Cooking  Use low-heat cooking methods, such as baking, instead of high-heat cooking methods like deep frying.  Cook using healthy oils, such as olive, canola, or sunflower oil.  Avoid cooking with butter, cream, or high-fat meats. Meal planning  Eat meals and snacks regularly, preferably at the same times every day. Avoid going long periods of time without eating.  Eat foods high in fiber, such as fresh fruits, vegetables, beans, and whole grains. Talk to your dietitian about how many servings of carbs you can eat at each meal.  Eat 4-6 ounces (oz) of lean protein each day, such as lean meat, chicken, fish, eggs, or tofu. One oz of lean  protein is equal to: ? 1 oz of meat, chicken, or fish. ? 1 egg. ?  cup of tofu.  Eat some foods each day that contain healthy fats, such as avocado, nuts, seeds, and fish. Lifestyle  Check your blood glucose regularly.  Exercise regularly as told by your health care provider. This may include: ? 150 minutes of moderate-intensity or vigorous-intensity exercise each week. This could be brisk walking, biking, or water aerobics. ? Stretching and doing strength exercises, such as yoga or weightlifting, at least 2 times a week.  Take medicines as told by your health care provider.  Do not use any products that contain nicotine or tobacco, such as cigarettes and e-cigarettes. If you need help quitting, ask your health care provider.  Work with a Social worker or diabetes educator to identify strategies to manage stress and any emotional and social challenges. Questions to ask a health care provider  Do I need to meet with a diabetes educator?  Do I need to meet with a dietitian?  What number can I call if I have questions?  When are the best times to check my blood glucose?  Where to find more information:  American Diabetes Association: diabetes.org  Academy of Nutrition and Dietetics: www.eatright.[employer] of Diabetes and Digestive and Kidney Diseases (NIH): [employer] Summary  A healthy meal plan will help you control your blood glucose and maintain a healthy lifestyle.  Working with a diet and nutrition specialist (dietitian) can help you make a meal plan that is best for you.  Keep in mind that carbohydrates (carbs) and alcohol have immediate effects on your blood glucose levels. It is important to count carbs and to use alcohol carefully. This information is not intended to replace advice given to you by your health care provider. Make sure you discuss any questions you have with your health care provider. Document Revised: 07/19/2017 Document Reviewed:  09/10/2016 Elsevier Patient Education  [employer].    If you have lab work done today you will be contacted with your lab results within the next 2 weeks.  If you have not heard from [place] then please contact us. The fastest way to get your results is to register for My Chart.   IF you received an x-ray today, you will receive an invoice from [facility]. Please contact [facility] at [phone] with questions or concerns regarding your invoice.   IF you received labwork today, you will receive an invoice from [facility]. Please contact [facility] at [phone] with questions or concerns regarding your invoice.   Our billing staff will not be able to assist you with questions regarding bills from these companies.  You will be contacted with the lab results as soon as they are available. The fastest way to get your results is to activate your My Chart account. Instructions are located on the last page of this paperwork. If you have not heard from [place] regarding the results in 2 weeks, please contact this office.         Signed, [name], MD [facility]

## 2019-08-24 NOTE — Patient Instructions (Addendum)
I will refer you to nutritionist to talk about diet with diabetes.  Continue metformin twice per day - set alarm or use pill minder to help remember dosing.  See handout on covid vaccine questions, let me know if there are further questions.   Recheck in 1 month for diabetes.    Diabetes Mellitus and Nutrition, Adult When you have diabetes (diabetes mellitus), it is very important to have healthy eating habits because your blood sugar (glucose) levels are greatly affected by what you eat and drink. Eating healthy foods in the appropriate amounts, at about the same times every day, can help you:  Control your blood glucose.  Lower your risk of heart disease.  Improve your blood pressure.  Reach or maintain a healthy weight. Every person with diabetes is different, and each person has different needs for a meal plan. Your health care provider may recommend that you work with a diet and nutrition specialist (dietitian) to make a meal plan that is best for you. Your meal plan may vary depending on factors such as:  The calories you need.  The medicines you take.  Your weight.  Your blood glucose, blood pressure, and cholesterol levels.  Your activity level.  Other health conditions you have, such as heart or kidney disease. How do carbohydrates affect me? Carbohydrates, also called carbs, affect your blood glucose level more than any other type of food. Eating carbs naturally raises the amount of glucose in your blood. Carb counting is a method for keeping track of how many carbs you eat. Counting carbs is important to keep your blood glucose at a healthy level, especially if you use insulin or take certain oral diabetes medicines. It is important to know how many carbs you can safely have in each meal. This is different for every person. Your dietitian can help you calculate how many carbs you should have at each meal and for each snack. Foods that contain carbs include:  Bread,  cereal, rice, pasta, and crackers.  Potatoes and corn.  Peas, beans, and lentils.  Milk and yogurt.  Fruit and juice.  Desserts, such as cakes, cookies, ice cream, and candy. How does alcohol affect me? Alcohol can cause a sudden decrease in blood glucose (hypoglycemia), especially if you use insulin or take certain oral diabetes medicines. Hypoglycemia can be a life-threatening condition. Symptoms of hypoglycemia (sleepiness, dizziness, and confusion) are similar to symptoms of having too much alcohol. If your health care provider says that alcohol is safe for you, follow these guidelines:  Limit alcohol intake to no more than 1 drink per day for nonpregnant women and 2 drinks per day for men. One drink equals 12 oz of beer, 5 oz of wine, or 1 oz of hard liquor.  Do not drink on an empty stomach.  Keep yourself hydrated with water, diet soda, or unsweetened iced tea.  Keep in mind that regular soda, juice, and other mixers may contain a lot of sugar and must be counted as carbs. What are tips for following this plan?  Reading food labels  Start by checking the serving size on the "Nutrition Facts" label of packaged foods and drinks. The amount of calories, carbs, fats, and other nutrients listed on the label is based on one serving of the item. Many items contain more than one serving per package.  Check the total grams (g) of carbs in one serving. You can calculate the number of servings of carbs in one serving by dividing the  total carbs by 15. For example, if a food has 30 g of total carbs, it would be equal to 2 servings of carbs.  Check the number of grams (g) of saturated and trans fats in one serving. Choose foods that have low or no amount of these fats.  Check the number of milligrams (mg) of salt (sodium) in one serving. Most people should limit total sodium intake to less than 2,300 mg per day.  Always check the nutrition information of foods labeled as "low-fat" or  "nonfat". These foods may be higher in added sugar or refined carbs and should be avoided.  Talk to your dietitian to identify your daily goals for nutrients listed on the label. Shopping  Avoid buying canned, premade, or processed foods. These foods tend to be high in fat, sodium, and added sugar.  Shop around the outside edge of the grocery store. This includes fresh fruits and vegetables, bulk grains, fresh meats, and fresh dairy. Cooking  Use low-heat cooking methods, such as baking, instead of high-heat cooking methods like deep frying.  Cook using healthy oils, such as olive, canola, or sunflower oil.  Avoid cooking with butter, cream, or high-fat meats. Meal planning  Eat meals and snacks regularly, preferably at the same times every day. Avoid going long periods of time without eating.  Eat foods high in fiber, such as fresh fruits, vegetables, beans, and whole grains. Talk to your dietitian about how many servings of carbs you can eat at each meal.  Eat 4-6 ounces (oz) of lean protein each day, such as lean meat, chicken, fish, eggs, or tofu. One oz of lean protein is equal to: ? 1 oz of meat, chicken, or fish. ? 1 egg. ?  cup of tofu.  Eat some foods each day that contain healthy fats, such as avocado, nuts, seeds, and fish. Lifestyle  Check your blood glucose regularly.  Exercise regularly as told by your health care provider. This may include: ? 150 minutes of moderate-intensity or vigorous-intensity exercise each week. This could be brisk walking, biking, or water aerobics. ? Stretching and doing strength exercises, such as yoga or weightlifting, at least 2 times a week.  Take medicines as told by your health care provider.  Do not use any products that contain nicotine or tobacco, such as cigarettes and e-cigarettes. If you need help quitting, ask your health care provider.  Work with a Social worker or diabetes educator to identify strategies to manage stress and  any emotional and social challenges. Questions to ask a health care provider  Do I need to meet with a diabetes educator?  Do I need to meet with a dietitian?  What number can I call if I have questions?  When are the best times to check my blood glucose? Where to find more information:  American Diabetes Association: diabetes.org  Academy of Nutrition and Dietetics: www.eatright.[employer] of Diabetes and Digestive and Kidney Diseases (NIH): [employer] Summary  A healthy meal plan will help you control your blood glucose and maintain a healthy lifestyle.  Working with a diet and nutrition specialist (dietitian) can help you make a meal plan that is best for you.  Keep in mind that carbohydrates (carbs) and alcohol have immediate effects on your blood glucose levels. It is important to count carbs and to use alcohol carefully. This information is not intended to replace advice given to you by your health care provider. Make sure you discuss any questions you have with your  health care provider. Document Revised: 07/19/2017 Document Reviewed: 09/10/2016 Elsevier Patient Education  [employer].    If you have lab work done today you will be contacted with your lab results within the next 2 weeks.  If you have not heard from [place] then please contact us. The fastest way to get your results is to register for My Chart.   IF you received an x-ray today, you will receive an invoice from [facility]. Please contact [facility] at [phone] with questions or concerns regarding your invoice.   IF you received labwork today, you will receive an invoice from [place]. Please contact [facility] at [phone] with questions or concerns regarding your invoice.   Our billing staff will not be able to assist you with questions regarding bills from these companies.  You will be contacted with the lab results as soon as they are available. The fastest  way to get your results is to activate your My Chart account. Instructions are located on the last page of this paperwork. If you have not heard from [place] regarding the results in 2 weeks, please contact this office.

## 2019-08-25 ENCOUNTER — Encounter: Payer: Self-pay | Admitting: Family Medicine

## 2019-08-25 LAB — MICROALBUMIN / CREATININE URINE RATIO
Creatinine, Urine: 172 mg/dL
Microalb/Creat Ratio: 4 mg/g creat (ref 0–29)
Microalbumin, Urine: 7.5 ug/mL

## 2019-09-10 ENCOUNTER — Ambulatory Visit: Payer: Medicare Other | Admitting: Dietician

## 2019-09-17 ENCOUNTER — Other Ambulatory Visit: Payer: Self-pay

## 2019-09-17 DIAGNOSIS — Z20822 Contact with and (suspected) exposure to covid-19: Secondary | ICD-10-CM

## 2019-09-18 LAB — NOVEL CORONAVIRUS, NAA: SARS-CoV-2, NAA: NOT DETECTED

## 2019-09-21 ENCOUNTER — Ambulatory Visit: Payer: Self-pay

## 2019-09-24 ENCOUNTER — Encounter: Payer: Self-pay | Admitting: Family Medicine

## 2019-09-24 ENCOUNTER — Other Ambulatory Visit: Payer: Self-pay

## 2019-09-24 ENCOUNTER — Telehealth: Payer: Self-pay | Admitting: Emergency Medicine

## 2019-09-24 ENCOUNTER — Ambulatory Visit (INDEPENDENT_AMBULATORY_CARE_PROVIDER_SITE_OTHER): Payer: Medicare Other | Admitting: Family Medicine

## 2019-09-24 VITALS — BP 123/76 | HR 71 | Temp 98.1°F | Ht 69.0 in | Wt 190.0 lb

## 2019-09-24 DIAGNOSIS — E1165 Type 2 diabetes mellitus with hyperglycemia: Secondary | ICD-10-CM | POA: Diagnosis not present

## 2019-09-24 DIAGNOSIS — E1142 Type 2 diabetes mellitus with diabetic polyneuropathy: Secondary | ICD-10-CM

## 2019-09-24 LAB — POCT GLYCOSYLATED HEMOGLOBIN (HGB A1C): Hemoglobin A1C: 8.3 % — AB (ref 4.0–5.6)

## 2019-09-24 LAB — GLUCOSE, POCT (MANUAL RESULT ENTRY): POC Glucose: 239 mg/dl — AB (ref 70–99)

## 2019-09-24 MED ORDER — METFORMIN HCL 850 MG PO TABS: 850.0000 mg | ORAL_TABLET | Freq: Two times a day (BID) | ORAL | 1 refills | Status: DC

## 2019-09-24 NOTE — Telephone Encounter (Signed)
Please reschedule patient referral. Patient was unaware and forgot appt. And Im not sure which facility you have referred this patient

## 2019-09-24 NOTE — Progress Notes (Signed)
Subjective:  Patient ID: [name], male    DOB: 07/29/1946  Age: 74 y.o. MRN: [id number]  CC:  Chief Complaint  Patient presents with  . Follow-up    on Diabetes type 2. pt states that some times he loses sensation in his feet but this isn't a constant thing. pt states his blood sugar has been running high. pt checks his blood sugar 3x a week.    HPI [name] presents for   Diabetes: Complicated by hyperglycemia.  Increasing readings last year, restarted on medication in November.  Tolerating Metformin 500 mg twice daily at last visit.  Was missing approximately 2 doses per week. Variable readings reported last visit as well as 65, up to 202.  Was making some improvements with diet, referred to nutritionist.  Pill minder recommended for compliance with medication. Taking metformin 538m BID.no diarrhea or new side effects. Rare diarrhea, no vomiting.   Missing 2 doses per week.  Reading 223 this morning.  Denies symptomatic lows. Unsure of lowest reading.  Microalbumin: ratio normal 08/24/19.  Occasional burning/itching feeling under feet comes and goes. Past month.  No show for nutrition appointment - he does not remember the appointment. Would like to meet with nutritionist.  Optho, foot exam, pneumovax: up to date.   On pravachol for statin.     Lab Results  Component Value Date   HGBA1C 8.3 (A) 09/24/2019   HGBA1C 9.8 (A) 07/03/2019   HGBA1C 6.0 (H) 03/09/2019   Lab Results  Component Value Date   MICROALBUR 0.8 04/19/2016   LDLCALC 48 03/09/2019   CREATININE 1.17 06/29/2019   Depression screen PHQ 2/9 09/24/2019 08/24/2019 07/17/2019 07/03/2019 05/27/2019  Decreased Interest 0 0 0 0 0  Down, Depressed, Hopeless 0 0 0 0 0  PHQ - 2 Score 0 0 0 0 0  Altered sleeping - - - - -  Tired, decreased energy - - - - -  Change in appetite - - - - -  Feeling bad or failure about yourself  - - - - -  Trouble concentrating - - - - -  Moving slowly or  fidgety/restless - - - - -  Suicidal thoughts - - - - -  PHQ-9 Score - - - - -  Difficult doing work/chores - - - - -  Some recent data might be hidden     History Patient Active Problem List   Diagnosis Date Noted  . Iron deficiency anemia 11/04/2018  . Orthostatic hypotension 08/06/2018  . Hypertension 08/06/2018  . Hyperlipidemia 08/06/2018  . Pancytopenia ([place] 08/06/2018  . 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 Insufficient treatment with nasal CPAP 05/05/2018  . 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 Irregular heart beat 01/28/2018  . Noncompliance with CPAP treatment 08/14/2017  . Excessive daytime sleepiness 04/09/2017  . Snoring 04/09/2017  . Memory impairment of gradual onset 04/09/2017  . Aortic atherosclerosis ([place] 11/27/2016  . 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 Altered mental status 03/05/2013  . Respiratory failure, post-operative ([place] 03/02/2013  . Hepatitis B 02/24/2013  . Cancer, hepatocellular ([place] 02/10/2013  . Liver tumor-bleeding 01/15/2013  . Epidermal cyst 06/18/2012  . OSA (obstructive sleep apnea) 03/13/2012  . Abnormal leg movement 12/12/2011  . Blood pressure elevated 09/06/2011  . Diabetes mellitus ([place] 09/06/2011  . 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 BPH (benign prostatic hyperplasia) 09/06/2011  . Diverticula of colon 09/06/2011  Past Medical History:  Diagnosis Date  . Cancer [facility])    hepatocellular cancer   . Diabetes mellitus without complication ([place])   . Dyspnea   . Hepatitis B   . Hyperlipidemia   . Hypertension   . Irregular heart beat 01/28/2018  . Narcolepsy    per office visit note of 08/2011   . Sleep apnea    cpap   Past Surgical History:  Procedure Laterality Date  . CHOLECYSTECTOMY N/A 01/28/2018   Procedure: LAPAROSCOPIC CHOLECYSTECTOMY;  Surgeon: [name], MD;  Location: [facility];  Service: General;  Laterality: N/A;    . COLONOSCOPY WITH PROPOFOL N/A 08/27/2017   Procedure: COLONOSCOPY WITH PROPOFOL;  Surgeon: Clarene Essex, MD;  Location: [facility];  Service: Endoscopy;  Laterality: N/A;  . HOT HEMOSTASIS N/A 08/27/2017   Procedure: HOT HEMOSTASIS (ARGON PLASMA COAGULATION/BICAP);  Surgeon: Clarene Essex, MD;  Location: [facility];  Service: Endoscopy;  Laterality: N/A;  . LAPAROSCOPIC CHOLECYSTECTOMY  01/28/2018  . LAPAROSCOPY  03/02/2013   Procedure: LAPAROSCOPY DIAGNOSTIC;  Surgeon: [name], MD;  Location: [facility];  Service: General;;  . LIVER ULTRASOUND  03/02/2013   Procedure: LIVER ULTRASOUND;  Surgeon: [name], MD;  Location: [facility];  Service: General;;  . OPEN PARTIAL HEPATECTOMY   03/02/2013   Procedure: OPEN PARTIAL HEPATECTOMY [83];  Surgeon: [name], MD;  Location: [facility];  Service: General;;  DX LAPAROSCOPY, INTRAOPERATIVE LIVER ULTRASOUND, OPEN PARTIAL HEPATECTOMY  . pinched nerve in back     Allergies  Allergen Reactions  . Ace Inhibitors Swelling and Other (See Comments)    Angioedema - face.    Prior to Admission medications   Medication Sig Start Date End Date Taking? Authorizing Provider  ACCU-CHEK SOFTCLIX LANCETS lancets Once per day testing. 08/05/17  Yes Wendie Agreste, MD  alfuzosin (UROXATRAL) 10 MG 24 hr tablet Take 1 tablet (10 mg total) by mouth at bedtime. 05/27/19  Yes Wendie Agreste, MD  amphetamine-dextroamphetamine (ADDERALL XR) 30 MG 24 hr capsule Take daily PRN by mouth with water 30 minutes before breakfast. 06/09/19  Yes Ward Givens, NP  blood glucose meter kit and supplies Dispense based on patient and insurance preference. Use once per day. 07/17/19  Yes Wendie Agreste, MD  donepezil (ARICEPT) 10 MG tablet Take 1 tablet (10 mg total) by mouth at bedtime. 06/09/19  Yes Ward Givens, NP  feeding supplement, ENSURE ENLIVE, (ENSURE ENLIVE) LIQD Take 237 mLs by mouth 2 (two) times daily between meals. 08/08/18  Yes Emokpae, Courage, MD  ferrous  sulfate (KP FERROUS SULFATE) 325 (65 FE) MG tablet Take 1 tablet (325 mg total) by mouth 3 (three) times daily with meals. 08/08/18  Yes Emokpae, Courage, MD  glucose blood (ACCU-CHEK AVIVA PLUS) test strip 1 each by Other route as needed for other. Use as instructed - once per day testing for now. 07/30/18  Yes Wendie Agreste, MD  metFORMIN (GLUCOPHAGE) 500 MG tablet Take 1 tablet (500 mg total) by mouth 2 (two) times daily with a meal. 07/03/19  Yes Wendie Agreste, MD  Multiple Vitamin (MULTIVITAMIN WITH MINERALS) TABS tablet Take 1 tablet by mouth daily. 08/09/18  Yes Emokpae, Courage, MD  pravastatin (PRAVACHOL) 20 MG tablet Take 1 tablet (20 mg total) by mouth at bedtime. 05/27/19  Yes Wendie Agreste, MD  psyllium (METAMUCIL) 58.6 % powder Take 1 packet by mouth daily. Member purchased over-the-counter 09/22/18  Yes [provider]  sertraline (ZOLOFT) 25 MG tablet Take 1  tablet (25 mg total) by mouth daily. 05/27/19  Yes Wendie Agreste, MD  spironolactone (ALDACTONE) 25 MG tablet Take 1 tablet (25 mg total) by mouth daily. 05/27/19  Yes Wendie Agreste, MD   Social History   Socioeconomic History  . Marital status: Divorced    Spouse name: Not on file  . Number of children: 2  . Years of education: 15+  . Highest education level: Bachelor's degree (e.g., BA, AB, BS)  Occupational History  . Not on file  Tobacco Use  . Smoking status: Former Smoker    Years: 5.00    Types: Cigarettes  . Smokeless tobacco: Never Used  Substance and Sexual Activity  . Alcohol use: No  . Drug use: No  . Sexual activity: Not Currently  Other Topics Concern  . Not on file  Social History Narrative   Patient is single and lives alone- became widower when 1 child was 89 year old, divorced 2nd marriage   Has #2 grown children, not in area or involved in his life   Patient is retired.   Patient has a college education.   Patient is right-handed.   Patient drinks maybe two sodas daily.    Social Determinants of Health   Financial Resource Strain:   . Difficulty of Paying Living Expenses: Not on file  Food Insecurity:   . Worried About Charity fundraiser in the Last Year: Not on file  . Ran Out of Food in the Last Year: Not on file  Transportation Needs:   . Lack of Transportation (Medical): Not on file  . Lack of Transportation (Non-Medical): Not on file  Physical Activity:   . Days of Exercise per Week: Not on file  . Minutes of Exercise per Session: Not on file  Stress:   . Feeling of Stress : Not on file  Social Connections:   . Frequency of Communication with Friends and Family: Not on file  . Frequency of Social Gatherings with Friends and Family: Not on file  . Attends Religious Services: Not on file  . Active Member of Clubs or Organizations: Not on file  . Attends Archivist Meetings: Not on file  . Marital Status: Not on file  Intimate Partner Violence:   . Fear of Current or Ex-Partner: Not on file  . Emotionally Abused: Not on file  . Physically Abused: Not on file  . Sexually Abused: Not on file    Review of Systems Per HPI.   Objective:   Vitals:   09/24/19 1057  BP: 123/76  Pulse: 71  Temp: 98.1 F (36.7 C)  TempSrc: Temporal  SpO2: 99%  Weight: 190 lb (86.2 kg)  Height: '5\' 9"'  (1.753 m)    Physical Exam Vitals reviewed.  Constitutional:      Appearance: He is well-developed.  HENT:     Head: Normocephalic and atraumatic.  Eyes:     Pupils: Pupils are equal, round, and reactive to light.  Neck:     Vascular: No carotid bruit or JVD.  Cardiovascular:     Rate and Rhythm: Normal rate and regular rhythm.     Heart sounds: Normal heart sounds. No murmur.  Pulmonary:     Effort: Pulmonary effort is normal.     Breath sounds: Normal breath sounds. No rales.  Abdominal:     Tenderness: There is no abdominal tenderness.  Skin:    General: Skin is warm and dry.  Neurological:     Mental  Status: He is alert and  oriented to person, place, and time.  Psychiatric:        Mood and Affect: Mood normal.        Behavior: Behavior normal.   Foot exam: Few calluses on right plantar foot without wounds.  Reports episodic dysesthesias in the arch of the foot bilaterally, no rash, asymptomatic at present.  Results for orders placed or performed in visit on 09/24/19  POCT glucose (manual entry)  Result Value Ref Range   POC Glucose 239 (A) 70 - 99 mg/dl  POCT glycosylated hemoglobin (Hb A1C)  Result Value Ref Range   Hemoglobin A1C 8.3 (A) 4.0 - 5.6 %   HbA1c POC (<> result, manual entry)     HbA1c, POC (prediabetic range)     HbA1c, POC (controlled diabetic range)        Assessment & Plan:  JETHRO RADKE is a 74 y.o. male . Type 2 diabetes mellitus with hyperglycemia, without long-term current use of insulin (HCC) - Plan: POCT glucose (manual entry), POCT glycosylated hemoglobin (Hb A1C), metFORMIN (GLUCOPHAGE) 850 MG tablet  -Uncontrolled, will increase Metformin but also try to have him meet with diabetic nutritionist to help with meal planning.  Recheck 6 weeks.  Sooner if any new side effects at higher dose.  Diabetic polyneuropathy associated with type 2 diabetes mellitus (HCC)  -Intermittent symptoms.  Option of gabapentin but will initially try to improve diabetes control as above.  Recheck 6 weeks.  Meds ordered this encounter  Medications  . metFORMIN (GLUCOPHAGE) 850 MG tablet    Sig: Take 1 tablet (850 mg total) by mouth 2 (two) times daily with a meal.    Dispense:  180 tablet    Refill:  1   Patient Instructions   Blood sugar still too high.  Increase metformin dose to 850 mg, still take 1 pill twice per day, follow-up in 6 weeks.  Tingling in your feet may be related to the high blood sugar.  As that improves I would like to see that go away.  If that is happening daily, I do have another medicine we can start.  Follow-up if worsening.  Return to the clinic or go to the  nearest emergency room if any of your symptoms worsen or new symptoms occur.   If you have lab work done today you will be contacted with your lab results within the next 2 weeks.  If you have not heard from [place] then please contact us. The fastest way to get your results is to register for My Chart.   IF you received an x-ray today, you will receive an invoice from [facility]. Please contact [facility] at [phone] with questions or concerns regarding your invoice.   IF you received labwork today, you will receive an invoice from [place]. Please contact [facility] at [phone] with questions or concerns regarding your invoice.   Our billing staff will not be able to assist you with questions regarding bills from these companies.  You will be contacted with the lab results as soon as they are available. The fastest way to get your results is to activate your My Chart account. Instructions are located on the last page of this paperwork. If you have not heard from [place] regarding the results in 2 weeks, please contact this office.         Signed, [name], MD [facility]

## 2019-09-24 NOTE — Patient Instructions (Addendum)
Blood sugar still too high.  Increase metformin dose to 850 mg, still take 1 pill twice per day, follow-up in 6 weeks.  Tingling in your feet may be related to the high blood sugar.  As that improves I would like to see that go away.  If that is happening daily, I do have another medicine we can start.  Follow-up if worsening.  Return to the clinic or go to the nearest emergency room if any of your symptoms worsen or new symptoms occur.   If you have lab work done today you will be contacted with your lab results within the next 2 weeks.  If you have not heard from [place] then please contact us. The fastest way to get your results is to register for My Chart.   IF you received an x-ray today, you will receive an invoice from [facility]. Please contact [facility] at [phone] with questions or concerns regarding your invoice.   IF you received labwork today, you will receive an invoice from [place]. Please contact [facility] at [phone] with questions or concerns regarding your invoice.   Our billing staff will not be able to assist you with questions regarding bills from these companies.  You will be contacted with the lab results as soon as they are available. The fastest way to get your results is to activate your My Chart account. Instructions are located on the last page of this paperwork. If you have not heard from [place] regarding the results in 2 weeks, please contact this office.

## 2019-09-28 ENCOUNTER — Ambulatory Visit (HOSPITAL_COMMUNITY)
Admission: RE | Admit: 2019-09-28 | Discharge: 2019-09-28 | Disposition: A | Discharge status: Home / Self Care | Payer: Medicare Other | Source: Ambulatory Visit | Attending: Nurse Practitioner | Admitting: Nurse Practitioner

## 2019-09-28 ENCOUNTER — Other Ambulatory Visit: Payer: Self-pay

## 2019-09-28 ENCOUNTER — Inpatient Hospital Stay: Payer: Medicare Other | Attending: Oncology

## 2019-09-28 DIAGNOSIS — K5520 Angiodysplasia of colon without hemorrhage: Secondary | ICD-10-CM | POA: Insufficient documentation

## 2019-09-28 DIAGNOSIS — D500 Iron deficiency anemia secondary to blood loss (chronic): Secondary | ICD-10-CM | POA: Diagnosis present

## 2019-09-28 DIAGNOSIS — K644 Residual hemorrhoidal skin tags: Secondary | ICD-10-CM | POA: Insufficient documentation

## 2019-09-28 DIAGNOSIS — D649 Anemia, unspecified: Secondary | ICD-10-CM | POA: Diagnosis not present

## 2019-09-28 DIAGNOSIS — C220 Liver cell carcinoma: Secondary | ICD-10-CM | POA: Insufficient documentation

## 2019-09-28 DIAGNOSIS — D122 Benign neoplasm of ascending colon: Secondary | ICD-10-CM | POA: Diagnosis not present

## 2019-09-28 DIAGNOSIS — D124 Benign neoplasm of descending colon: Secondary | ICD-10-CM | POA: Diagnosis not present

## 2019-09-28 DIAGNOSIS — K621 Rectal polyp: Secondary | ICD-10-CM | POA: Diagnosis not present

## 2019-09-28 DIAGNOSIS — K7460 Unspecified cirrhosis of liver: Secondary | ICD-10-CM | POA: Diagnosis not present

## 2019-09-28 DIAGNOSIS — D1779 Benign lipomatous neoplasm of other sites: Secondary | ICD-10-CM | POA: Insufficient documentation

## 2019-09-28 DIAGNOSIS — D61818 Other pancytopenia: Secondary | ICD-10-CM | POA: Insufficient documentation

## 2019-09-28 DIAGNOSIS — K068 Other specified disorders of gingiva and edentulous alveolar ridge: Secondary | ICD-10-CM | POA: Insufficient documentation

## 2019-09-28 DIAGNOSIS — E119 Type 2 diabetes mellitus without complications: Secondary | ICD-10-CM | POA: Insufficient documentation

## 2019-09-28 DIAGNOSIS — R161 Splenomegaly, not elsewhere classified: Secondary | ICD-10-CM | POA: Insufficient documentation

## 2019-09-28 DIAGNOSIS — K648 Other hemorrhoids: Secondary | ICD-10-CM | POA: Insufficient documentation

## 2019-09-28 DIAGNOSIS — D123 Benign neoplasm of transverse colon: Secondary | ICD-10-CM | POA: Diagnosis not present

## 2019-09-28 DIAGNOSIS — I8500 Esophageal varices without bleeding: Secondary | ICD-10-CM | POA: Insufficient documentation

## 2019-09-28 DIAGNOSIS — R109 Unspecified abdominal pain: Secondary | ICD-10-CM | POA: Insufficient documentation

## 2019-09-28 DIAGNOSIS — Z79899 Other long term (current) drug therapy: Secondary | ICD-10-CM | POA: Insufficient documentation

## 2019-09-28 LAB — CBC WITH DIFFERENTIAL (CANCER CENTER ONLY)
Abs Immature Granulocytes: 0.01 10*3/uL (ref 0.00–0.07)
Basophils Absolute: 0 10*3/uL (ref 0.0–0.1)
Basophils Relative: 1 %
Eosinophils Absolute: 0 10*3/uL (ref 0.0–0.5)
Eosinophils Relative: 1 %
HCT: 32.5 % — ABNORMAL LOW (ref 39.0–52.0)
Hemoglobin: 10.5 g/dL — ABNORMAL LOW (ref 13.0–17.0)
Immature Granulocytes: 0 %
Lymphocytes Relative: 22 %
Lymphs Abs: 0.8 10*3/uL (ref 0.7–4.0)
MCH: 29.6 pg (ref 26.0–34.0)
MCHC: 32.3 g/dL (ref 30.0–36.0)
MCV: 91.5 fL (ref 80.0–100.0)
Monocytes Absolute: 0.4 10*3/uL (ref 0.1–1.0)
Monocytes Relative: 9 %
Neutro Abs: 2.5 10*3/uL (ref 1.7–7.7)
Neutrophils Relative %: 67 %
Platelet Count: 89 10*3/uL — ABNORMAL LOW (ref 150–400)
RBC: 3.55 MIL/uL — ABNORMAL LOW (ref 4.22–5.81)
RDW: 14.3 % (ref 11.5–15.5)
WBC Count: 3.7 10*3/uL — ABNORMAL LOW (ref 4.0–10.5)
nRBC: 0 % (ref 0.0–0.2)

## 2019-09-28 LAB — CMP (CANCER CENTER ONLY)
ALT: 23 U/L (ref 0–44)
AST: 27 U/L (ref 15–41)
Albumin: 3.4 g/dL — ABNORMAL LOW (ref 3.5–5.0)
Alkaline Phosphatase: 117 U/L (ref 38–126)
Anion gap: 8 (ref 5–15)
BUN: 8 mg/dL (ref 8–23)
CO2: 24 mmol/L (ref 22–32)
Calcium: 8.7 mg/dL — ABNORMAL LOW (ref 8.9–10.3)
Chloride: 108 mmol/L (ref 98–111)
Creatinine: 1.25 mg/dL — ABNORMAL HIGH (ref 0.61–1.24)
GFR, Est AFR Am: >60 mL/min (ref >60)
GFR, Estimated: 56 mL/min — ABNORMAL LOW (ref >60)
Glucose, Bld: 229 mg/dL — ABNORMAL HIGH (ref 70–99)
Potassium: 3.8 mmol/L (ref 3.5–5.1)
Sodium: 140 mmol/L (ref 135–145)
Total Bilirubin: 1.3 mg/dL — ABNORMAL HIGH (ref 0.3–1.2)
Total Protein: 7.5 g/dL (ref 6.5–8.1)

## 2019-09-28 MED ORDER — SODIUM CHLORIDE (PF) 0.9 % IJ SOLN
INTRAMUSCULAR | Status: AC
  Filled 2019-09-28: qty 50

## 2019-09-28 MED ORDER — IOHEXOL 300 MG/ML  SOLN
100.0000 mL | Freq: Once | INTRAMUSCULAR | Status: AC | PRN
  Administered 2019-09-28: 100 mL via INTRAVENOUS

## 2019-09-29 LAB — AFP TUMOR MARKER: AFP, Serum, Tumor Marker: 2.4 ng/mL (ref 0.0–8.3)

## 2019-10-01 ENCOUNTER — Other Ambulatory Visit: Payer: Self-pay

## 2019-10-01 ENCOUNTER — Encounter: Payer: Self-pay | Admitting: Nurse Practitioner

## 2019-10-01 ENCOUNTER — Inpatient Hospital Stay (HOSPITAL_BASED_OUTPATIENT_CLINIC_OR_DEPARTMENT_OTHER): Payer: Medicare Other | Admitting: Nurse Practitioner

## 2019-10-01 VITALS — BP 144/74 | HR 79 | Temp 98.2°F | Resp 17 | Ht 69.0 in | Wt 189.5 lb

## 2019-10-01 DIAGNOSIS — D500 Iron deficiency anemia secondary to blood loss (chronic): Secondary | ICD-10-CM | POA: Diagnosis not present

## 2019-10-01 DIAGNOSIS — Z79899 Other long term (current) drug therapy: Secondary | ICD-10-CM | POA: Diagnosis not present

## 2019-10-01 DIAGNOSIS — C220 Liver cell carcinoma: Secondary | ICD-10-CM

## 2019-10-01 DIAGNOSIS — D124 Benign neoplasm of descending colon: Secondary | ICD-10-CM | POA: Diagnosis not present

## 2019-10-01 DIAGNOSIS — K644 Residual hemorrhoidal skin tags: Secondary | ICD-10-CM | POA: Diagnosis not present

## 2019-10-01 DIAGNOSIS — D1779 Benign lipomatous neoplasm of other sites: Secondary | ICD-10-CM | POA: Diagnosis not present

## 2019-10-01 DIAGNOSIS — I8500 Esophageal varices without bleeding: Secondary | ICD-10-CM | POA: Diagnosis not present

## 2019-10-01 DIAGNOSIS — K648 Other hemorrhoids: Secondary | ICD-10-CM | POA: Diagnosis not present

## 2019-10-01 DIAGNOSIS — K5520 Angiodysplasia of colon without hemorrhage: Secondary | ICD-10-CM | POA: Diagnosis not present

## 2019-10-01 DIAGNOSIS — R161 Splenomegaly, not elsewhere classified: Secondary | ICD-10-CM | POA: Diagnosis not present

## 2019-10-01 DIAGNOSIS — E119 Type 2 diabetes mellitus without complications: Secondary | ICD-10-CM | POA: Diagnosis not present

## 2019-10-01 DIAGNOSIS — K621 Rectal polyp: Secondary | ICD-10-CM | POA: Diagnosis not present

## 2019-10-01 DIAGNOSIS — D61818 Other pancytopenia: Secondary | ICD-10-CM | POA: Diagnosis not present

## 2019-10-01 DIAGNOSIS — K068 Other specified disorders of gingiva and edentulous alveolar ridge: Secondary | ICD-10-CM | POA: Diagnosis not present

## 2019-10-01 DIAGNOSIS — R109 Unspecified abdominal pain: Secondary | ICD-10-CM | POA: Diagnosis not present

## 2019-10-01 DIAGNOSIS — D122 Benign neoplasm of ascending colon: Secondary | ICD-10-CM | POA: Diagnosis not present

## 2019-10-01 DIAGNOSIS — D649 Anemia, unspecified: Secondary | ICD-10-CM | POA: Diagnosis not present

## 2019-10-01 DIAGNOSIS — D123 Benign neoplasm of transverse colon: Secondary | ICD-10-CM | POA: Diagnosis not present

## 2019-10-01 DIAGNOSIS — K7460 Unspecified cirrhosis of liver: Secondary | ICD-10-CM | POA: Diagnosis not present

## 2019-10-01 NOTE — Progress Notes (Signed)
[facility] OFFICE PROGRESS NOTE   Diagnosis: Hepatocellular carcinoma, anemia  INTERVAL HISTORY:   [name] returns as scheduled.  He reports he is scheduled to receive the Covid vaccine this weekend. He has occasional abdominal pain.  He describes his appetite as "decent".  No bleeding except an episode of mild gum bleeding when he was eating potato chips recently.  He reports his brother died over the past weekend.  Objective:  Vital signs in last 24 hours:  Blood pressure (!) 144/74, pulse 79, temperature 98.2 F (36.8 C), temperature source Temporal, resp. rate 17, height 5\' 9"  (1.753 m), weight 189 lb 8 oz (86 kg), SpO2 100 %.   Limited physical examination due to COVID-19 distancing GI: Abdomen soft and nontender.  No hepatosplenomegaly. Vascular: No leg edema.  Lab Results:  Lab Results  Component Value Date   WBC 3.7 (L) 09/28/2019   HGB 10.5 (L) 09/28/2019   HCT 32.5 (L) 09/28/2019   MCV 91.5 09/28/2019   PLT 89 (L) 09/28/2019   NEUTROABS 2.5 09/28/2019    Imaging:  No results found.  Medications: I have reviewed the patient's current medications.  Assessment/Plan: 1. Hepatocellular carcinoma, stage I (T1 NX) status post partial left hepatectomy 03/02/2013.  08/31/2013 AFP 2.4.   CT abdomen 08/31/2013 with postoperative changes of partial hepatectomy with small postoperative fluid collection along the resection margin. No definite signs to suggest residual or locally recurrent disease. Several borderline enlarged and minimally enlarged lymph nodes superior to the liver in the juxtapericardiac fat of the lower anterior mediastinum with the largest measuring 11 mm slightly increased compared to the prior study 01/15/2013. Several prominent non-pathologically enlarged upper abdominal ligament lymph nodes similar to the prior study. Small esophageal varices.   CT abdomen 03/03/2012 without evidence of recurrent hepatocellular carcinoma,  stable right lung base nodule  CT the abdomen and pelvis 09/06/2014 without evidence of recurrent hepatocellular carcinoma, slight enlargement of pre-cardiac lymph nodes  CT abdomen/pelvis 09/07/2015 with no findings for recurrent tumor or metastatic disease. Stable small 3 mm right middle lobe pulmonary nodule since 2014. Epicardial lymph node measured 11 mm, previously 13 mm. Stable cirrhotic changes. Fairly significant esophageal varices.  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 Cirrhosis. 3. Hepatitis B core antibody positive. 4. Diabetes. 5. Severe microcytic anemia-likely iron deficiency anemia-progressive 06/03/2017. Stool Hemoccult positive 3 06/05/2017.  6. Colonoscopy 12/27/2016-external and internal hemorrhoids. Diverticulosis in the sigmoid, descending and transverse colon. One medium polyp in the distal descending colon. 3 small polyps in the transverse colon. 2 diminutive polyps in the rectum and the proximal ascending colon. Medium-sized lipoma transverse colon. Small lipoma mid ascending colon. Multiplenonbleeding colonic angiodysplastic lesions. Pathology-tubular adenomas, hyperplastic polyps. 7. Pancytopenia secondary to cirrhosis and GI bleeding 8. Laparoscopic cholecystectomy 01/28/2018 9. Decreased iron stores 10/15/2018; transfused 2 units of blood 10/18/2018; Feraheme 11/04/2018 and 11/11/2018   Disposition: [name] appears stable.  He remains in  clinical remission from hepatocellular carcinoma.  We reviewed the recent CT scan which showed no evidence of recurrent HCC.  We also discussed the labs from 09/28/2019.  AFP tumor marker remains in normal range.  He has  stable mild pancytopenia.  He will continue oral iron.  He will return for lab and follow-up in 4 months.  He will contact the office in the interim with any problems.  Plan reviewed with Dr. Benay Spice.    Ned Card ANP/GNP-BC   10/01/2019  10:34 AM

## 2019-10-06 ENCOUNTER — Telehealth: Payer: Self-pay | Admitting: Oncology

## 2019-10-06 NOTE — Telephone Encounter (Signed)
Scheduled per los. Called and spoke with patient. Confirmed appt 

## 2019-10-13 ENCOUNTER — Telehealth: Payer: Self-pay | Admitting: Family Medicine

## 2019-10-13 NOTE — Telephone Encounter (Signed)
Note reviewed.  Make sure he is using the pill minder/pillbox to help with remembering medications, as well as an alarm on the phone it may also help.  Let me know if we can help further.

## 2019-10-13 NOTE — Telephone Encounter (Signed)
[name] from [facility] would like Dr. Carlota Raspberry to know pt  has been running a 200-300 range for blood sugar. Pt is at 332 today. She would like [place] to give pt a cb at [phone].

## 2019-10-13 NOTE — Telephone Encounter (Signed)
Spoke with pt regarding high blood sugar readings. Per [employer], blood sugar has been 200-300.[name] Pt says he just forgets  to take his meds. He does have an appt next month. He will give [place] a call if he finds his numbers getting any higher.

## 2019-10-15 ENCOUNTER — Telehealth: Payer: Self-pay | Admitting: Family Medicine

## 2019-10-15 NOTE — Telephone Encounter (Signed)
[place] Presenter, broadcasting called to let dr know pts blood sugar today was 448 and his trans have been in the upper 200s & 300s. Please advise. [phone]

## 2019-10-15 NOTE — Telephone Encounter (Signed)
Called and spoke with pt. He informed me he realized today he was taking his old Diabetes medication, and he thinks that's why he was getting the high readings. Pt stated he is now taking his current and correct diabetes medication. Pt was informed if he continues the get these high reading to go straight to the E.R.

## 2019-10-15 NOTE — Telephone Encounter (Signed)
Is he taking meds? There was a concern on prior message about compliance.  Also call and check on him. If he is having any new symptoms at that level, should be seen in ER. Thanks.

## 2019-11-05 ENCOUNTER — Ambulatory Visit (INDEPENDENT_AMBULATORY_CARE_PROVIDER_SITE_OTHER): Payer: Medicare Other | Admitting: Family Medicine

## 2019-11-05 ENCOUNTER — Other Ambulatory Visit: Payer: Self-pay

## 2019-11-05 ENCOUNTER — Encounter: Payer: Self-pay | Admitting: Family Medicine

## 2019-11-05 VITALS — BP 148/76 | HR 82 | Temp 97.6°F | Ht 69.0 in | Wt 194.0 lb

## 2019-11-05 DIAGNOSIS — G3184 Mild cognitive impairment, so stated: Secondary | ICD-10-CM

## 2019-11-05 DIAGNOSIS — E1165 Type 2 diabetes mellitus with hyperglycemia: Secondary | ICD-10-CM

## 2019-11-05 LAB — GLUCOSE, POCT (MANUAL RESULT ENTRY): POC Glucose: 201 mg/dl — AB (ref 70–99)

## 2019-11-05 MED ORDER — PILL BOX 7 DAY MISC: 1.0000 | Freq: Every day | 0 refills | Status: DC

## 2019-11-05 NOTE — Progress Notes (Signed)
Subjective:  Patient ID: [name], male    DOB: Nov 30, 1945  Age: 74 y.o. MRN: [id number]  CC:  Chief Complaint  Patient presents with  . Diabetes    pt hasn't had any issues with this condition. pt checks his BS daily. BS reading today177m/dl. pt has had sweetes resently.    HPI Edward [name] for   Diabetes: Associated with hyperglycemia.  See previous notes.  Had been low in July of last year, has been tapered back on medications.  Has had progressive increasing blood sugars.  Medication has been added back and.  Most recent visit February 4 glucose 239, A1c 8.3.  Metformin increased to 850 mg twice daily, and plan for meeting with diabetic nutritionist to help with meal planning.  Found to have component of diabetic neuropathy, option of gabapentin discussed but deferred initially.  Telephone call noted February 23.  There was some concern in the past with his adherence to medications.  Setting alarm and using pillbox have been discussed.  February 25 telephone note reviewed, has been using old medication. No show for nutrition appt on 1/21.  No diarrhea with metformin.  History of mild cognitive impairment, saw neuro in October- continued on aricept, and adderall for Narcolepsy. 1 year follow up.  Brother passed away few weeks ago. Handling his loss ok.   Got both covid vaccines.  Denies change in thirst/urinary frequency. No chest pain, shortness of breath.   Currently using 8515mmetformin BID. Denies missed doses.  Home readings per diary in February/March: low 145, high 332. 205 yesterday. Reports these are after eating - not fasting readings.  Has someone coming to house 10 hours per week - MWF,  helping to prepare meals, review meds.  "KaSantiago Gladants me to go to diabetes specialist" - at oncology? Not sure.   Not using pill box.   Denies missed doses of spirinolactone Normal urine microalbumin creatinine ratio in January, diabetic tetanus  up-to-date. Lab Results  Component Value Date   HGBA1C 8.3 (A) 09/24/2019   HGBA1C 9.8 (A) 07/03/2019   HGBA1C 6.0 (H) 03/09/2019   Lab Results  Component Value Date   MICROALBUR 0.8 04/19/2016   LDLCALC 48 03/09/2019   CREATININE 1.25 (H) 09/28/2019    History Patient Active Problem List   Diagnosis Date Noted  . Iron deficiency anemia 11/04/2018  . Orthostatic hypotension 08/06/2018  . Hypertension 08/06/2018  . Hyperlipidemia 08/06/2018  . Pancytopenia (HC[facility]12/18/2019  . 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 Insufficient treatment with nasal CPAP 05/05/2018  . 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 Irregular heart beat 01/28/2018  . Noncompliance with CPAP treatment 08/14/2017  . Excessive daytime sleepiness 04/09/2017  . Snoring 04/09/2017  . Memory impairment of gradual onset 04/09/2017  . Aortic atherosclerosis ([place]04/05/2017  . 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 Altered mental status 03/05/2013  . Respiratory failure, post-operative (HC[facility]07/14/2014  . Hepatitis B 02/24/2013  . Cancer, hepatocellular (HCHuntingdon06/24/2014  . Liver tumor-bleeding 01/15/2013  . Epidermal cyst 06/18/2012  . OSA (obstructive sleep apnea) 03/13/2012  . Abnormal leg movement 12/12/2011  . Blood pressure elevated 09/06/2011  . Diabetes mellitus (HCRame[facility]01/17/2013  . 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 BPH (benign prostatic hyperplasia) 09/06/2011  . Diverticula of colon 09/06/2011   Past Medical History:  Diagnosis Date  . [facility]   hepatocellular cancer   . Diabetes mellitus without  complication ([place])   . Dyspnea   . Hepatitis B   . Hyperlipidemia   . Hypertension   . Irregular heart beat 01/28/2018  . Narcolepsy    per office visit note of 08/2011   . Sleep apnea    cpap   Past Surgical History:  Procedure Laterality Date  . CHOLECYSTECTOMY N/A 01/28/2018   Procedure: LAPAROSCOPIC  CHOLECYSTECTOMY;  Surgeon: [name], MD;  Location: [facility];  Service: General;  Laterality: N/A;  . COLONOSCOPY WITH PROPOFOL N/A 08/27/2017   Procedure: COLONOSCOPY WITH PROPOFOL;  Surgeon: Clarene Essex, MD;  Location: [facility];  Service: Endoscopy;  Laterality: N/A;  . HOT HEMOSTASIS N/A 08/27/2017   Procedure: HOT HEMOSTASIS (ARGON PLASMA COAGULATION/BICAP);  Surgeon: Clarene Essex, MD;  [facility];  Service: Endoscopy;  Laterality: N/A;  . LAPAROSCOPIC CHOLECYSTECTOMY  01/28/2018  . LAPAROSCOPY  03/02/2013   Procedure: LAPAROSCOPY DIAGNOSTIC;  Surgeon: [name], MD;  Location: [facility];  Service: General;;  . LIVER ULTRASOUND  03/02/2013   Procedure: LIVER ULTRASOUND;  Surgeon: [name], MD;  Location: [facility];  Service: General;;  . OPEN PARTIAL HEPATECTOMY   03/02/2013   Procedure: OPEN PARTIAL HEPATECTOMY [83];  Surgeon: [name], MD;  Location: [facility];  Service: General;;  DX LAPAROSCOPY, INTRAOPERATIVE LIVER ULTRASOUND, OPEN PARTIAL HEPATECTOMY  . pinched nerve in back     Allergies  Allergen Reactions  . Ace Inhibitors Swelling and Other (See Comments)    Angioedema - face.    Prior to Admission medications   Medication Sig Start Date End Date Taking? Authorizing Provider  ACCU-CHEK SOFTCLIX LANCETS lancets Once per day testing. 08/05/17  Yes Wendie Agreste, MD  alfuzosin (UROXATRAL) 10 MG 24 hr tablet Take 1 tablet (10 mg total) by mouth at bedtime. 05/27/19  Yes Wendie Agreste, MD  amphetamine-dextroamphetamine (ADDERALL XR) 30 MG 24 hr capsule Take daily PRN by mouth with water 30 minutes before breakfast. 06/09/19  Yes Ward Givens, NP  blood glucose meter kit and supplies Dispense based on patient and insurance preference. Use once per day. 07/17/19  Yes Wendie Agreste, MD  donepezil (ARICEPT) 10 MG tablet Take 1 tablet (10 mg total) by mouth at bedtime. 06/09/19  Yes Ward Givens, NP  feeding supplement, ENSURE ENLIVE, (ENSURE ENLIVE) LIQD  Take 237 mLs by mouth 2 (two) times daily between meals. 08/08/18  Yes Emokpae, Courage, MD  ferrous sulfate (KP FERROUS SULFATE) 325 (65 FE) MG tablet Take 1 tablet (325 mg total) by mouth 3 (three) times daily with meals. 08/08/18  Yes Emokpae, Courage, MD  glucose blood (ACCU-CHEK AVIVA PLUS) test strip 1 each by Other route as needed for other. Use as instructed - once per day testing for now. 07/30/18  Yes Wendie Agreste, MD  metFORMIN (GLUCOPHAGE) 850 MG tablet Take 1 tablet (850 mg total) by mouth 2 (two) times daily with a meal. 09/24/19  Yes Wendie Agreste, MD  Multiple Vitamin (MULTIVITAMIN WITH MINERALS) TABS tablet Take 1 tablet by mouth daily. 08/09/18  Yes Emokpae, Courage, MD  pravastatin (PRAVACHOL) 20 MG tablet Take 1 tablet (20 mg total) by mouth at bedtime. 05/27/19  Yes Wendie Agreste, MD  psyllium (METAMUCIL) 58.6 % powder Take 1 packet by mouth daily. Member purchased over-the-counter 09/22/18  Yes [provider]  sertraline (ZOLOFT) 25 MG tablet Take 1 tablet (25 mg total) by mouth daily. 05/27/19  Yes Wendie Agreste, MD  spironolactone (ALDACTONE) 25 MG tablet Take 1  tablet (25 mg total) by mouth daily. 05/27/19  Yes Wendie Agreste, MD   Social History   Socioeconomic History  . Marital status: Divorced    Spouse name: Not on file  . Number of children: 2  . Years of education: 15+  . Highest education level: Bachelor's degree (e.g., BA, AB, BS)  Occupational History  . Not on file  Tobacco Use  . Smoking status: Former Smoker    Years: 5.00    Types: Cigarettes  . Smokeless tobacco: Never Used  Substance and Sexual Activity  . Alcohol use: No  . Drug use: No  . Sexual activity: Not Currently  Other Topics Concern  . Not on file  Social History Narrative   Patient is single and lives alone- became widower when 1 child was 80 year old, divorced 2nd marriage   Has #2 grown children, not in area or involved in his life   Patient is retired.    Patient has a college education.   Patient is right-handed.   Patient drinks maybe two sodas daily.   Social Determinants of Health   Financial Resource Strain:   . Difficulty of Paying Living Expenses:   Food Insecurity:   . Worried About Charity fundraiser in the Last Year:   . Arboriculturist in the Last Year:   Transportation Needs:   . Film/video editor (Medical):   [name] Kitchen Lack of Transportation (Non-Medical):   Physical Activity:   . Days of Exercise per Week:   . Minutes of Exercise per Session:   Stress:   . Feeling of Stress :   Social Connections:   . Frequency of Communication with Friends and Family:   . Frequency of Social Gatherings with Friends and Family:   . Attends Religious Services:   . Active Member of Clubs or Organizations:   . Attends Archivist Meetings:   [name] Kitchen Marital Status:   Intimate Partner Violence:   . Fear of Current or Ex-Partner:   . Emotionally Abused:   [name] Kitchen Physically Abused:   . Sexually Abused:     Review of Systems Per HPI.   Objective:   Vitals:   11/05/19 1125 11/05/19 1127  BP: (!) 154/71 (!) 148/76  Pulse: 82   Temp: 97.6 F (36.4 C)   TempSrc: Temporal   SpO2: 100%   Weight: 194 lb (88 kg)   Height: 5' 9" (1.753 m)     Results for orders placed or performed in visit on 11/05/19  POCT glucose (manual entry)  Result Value Ref Range   POC Glucose 201 (A) 70 - 99 mg/dl    Physical Exam Vitals reviewed.  Constitutional:      Appearance: He is well-developed.  HENT:     Head: Normocephalic and atraumatic.  Eyes:     Pupils: Pupils are equal, round, and reactive to light.  Neck:     Vascular: No carotid bruit or JVD.  Cardiovascular:     Rate and Rhythm: Normal rate and regular rhythm.     Heart sounds: Normal heart sounds. No murmur.  Pulmonary:     Effort: Pulmonary effort is normal.     Breath sounds: Normal breath sounds. No rales.  Skin:    General: Skin is warm and dry.  Neurological:      Mental Status: He is alert and oriented to person, place, and time.        Assessment & Plan:  EDSON DERIDDER  is a 74 y.o. male . Type 2 diabetes mellitus with hyperglycemia, without long-term current use of insulin ([place]) - Plan: Misc. Devices (PILL BOX 7 DAY) MISC, POCT glucose (manual entry)  Mild cognitive impairment - Plan: Misc. Devices (PILL BOX 7 DAY) MISC, POCT glucose (manual entry)  With variable readings and previous phone message, I am concerned that he may not be completely adherent to med regimen.  Appears he is still dosing medication from individual pill bottles.  I prescribed a pillbox for him to have medication placed by assistant at home.  Certainly could adjust regimen if persistent elevated readings and compliant with current regimen, or endocrinology eval if needed.  Commended on home monitoring, but add fasting blood sugars with additional information.  Consider closer follow-up with neurology if more confusion/cognitive change.  Meds ordered this encounter  Medications  . Misc. Devices (PILL BOX 7 DAY) MISC    Sig: 1 Device by Does not apply route daily. Have assistant fill pill box with daily meds.    Dispense:  1 each    Refill:  0   Patient Instructions    Check some blood sugars as soon as you get up (before meals). Bring those readings to next visit.  Continue metformin at same dose for now.  If still elevated with using pill box we may increase dose or add meds next visit.     Return to the clinic or go to the nearest emergency room if any of your symptoms worsen or new symptoms occur.  If you have lab work done today you will be contacted with your lab results within the next 2 weeks.  If you have not heard from [place] then please contact us. The fastest way to get your results is to register for My Chart.   IF you received an x-ray today, you will receive an invoice from [facility]. Please contact [facility] at [phone] with  questions or concerns regarding your invoice.   IF you received labwork today, you will receive an invoice from [facility]. Please contact [facility] at [phone] with questions or concerns regarding your invoice.   Our billing staff will not be able to assist you with questions regarding bills from these companies.  You will be contacted with the lab results as soon as they are available. The fastest way to get your results is to activate your My Chart account. Instructions are located on the last page of this paperwork. If you have not heard from [place] regarding the results in 2 weeks, please contact this office.         Signed, [name], MD [facility]

## 2019-11-05 NOTE — Patient Instructions (Addendum)
  Check some blood sugars as soon as you get up (before meals). Bring those readings to next visit.  Continue metformin at same dose for now.  If still elevated with using pill box we may increase dose or add meds next visit.     Return to the clinic or go to the nearest emergency room if any of your symptoms worsen or new symptoms occur.  If you have lab work done today you will be contacted with your lab results within the next 2 weeks.  If you have not heard from [place] then please contact us. The fastest way to get your results is to register for My Chart.   IF you received an x-ray today, you will receive an invoice from [facility]. Please contact [facility] at [phone] with questions or concerns regarding your invoice.   IF you received labwork today, you will receive an invoice from [facility]. Please contact [facility] at [phone] with questions or concerns regarding your invoice.   Our billing staff will not be able to assist you with questions regarding bills from these companies.  You will be contacted with the lab results as soon as they are available. The fastest way to get your results is to activate your My Chart account. Instructions are located on the last page of this paperwork. If you have not heard from [place] regarding the results in 2 weeks, please contact this office.

## 2019-11-07 ENCOUNTER — Encounter: Payer: Self-pay | Admitting: Family Medicine

## 2019-11-26 ENCOUNTER — Other Ambulatory Visit: Payer: Self-pay

## 2019-11-26 ENCOUNTER — Ambulatory Visit (INDEPENDENT_AMBULATORY_CARE_PROVIDER_SITE_OTHER): Payer: Medicare Other | Admitting: Family Medicine

## 2019-11-26 ENCOUNTER — Encounter: Payer: Self-pay | Admitting: Family Medicine

## 2019-11-26 VITALS — BP 123/63 | HR 85 | Temp 97.9°F | Ht 69.0 in | Wt 191.6 lb

## 2019-11-26 DIAGNOSIS — E1165 Type 2 diabetes mellitus with hyperglycemia: Secondary | ICD-10-CM

## 2019-11-26 MED ORDER — GLIPIZIDE 5 MG PO TABS: 5.0000 mg | ORAL_TABLET | Freq: Every day | ORAL | 0 refills | Status: DC

## 2019-11-26 MED ORDER — METFORMIN HCL 500 MG PO TABS: 500.0000 mg | ORAL_TABLET | Freq: Two times a day (BID) | ORAL | 1 refills | Status: DC

## 2019-11-26 MED ORDER — GLUCOSE BLOOD VI STRP: ORAL_STRIP | 12 refills | Status: DC

## 2019-11-26 NOTE — Patient Instructions (Addendum)
I will lower metformin dose - take 500mg pill twice per day. If you continue to have diarrhea or stomache upset on that dose, let me know.   Start glipizide 5mg one pill per day for diabetes. . Watch for any low blood sugars with this change (see precautions below).  If that occurs, let me know.   It is very important that you use a pillbox for medicines so you do not forget to take them or accidentally take too many.   recheck with home readings again in 3 weeks.   If you are having more trouble with your memory, I would like you to meet back with neurologist.   Have hearing aids checked, but let me know if I can help.   Thanks for coming in today.    Return to the clinic or go to the nearest emergency room if any of your symptoms worsen or new symptoms occur.   Hypoglycemia Hypoglycemia occurs when the level of sugar (glucose) in the blood is too low. Hypoglycemia can happen in people who do or do not have diabetes. It can develop quickly, and it can be a medical emergency. For most people with diabetes, a blood glucose level below 70 mg/dL (3.9 mmol/L) is considered hypoglycemia. Glucose is a type of sugar that provides the body's main source of energy. Certain hormones (insulin and glucagon) control the level of glucose in the blood. Insulin lowers blood glucose, and glucagon raises blood glucose. Hypoglycemia can result from having too much insulin in the bloodstream, or from not eating enough food that contains glucose. You may also have reactive hypoglycemia, which happens within 4 hours after eating a meal. What are the causes? Hypoglycemia occurs most often in people who have diabetes and may be caused by:  Diabetes medicine.  Not eating enough, or not eating often enough.  Increased physical activity.  Drinking alcohol on an empty stomach. If you do not have diabetes, hypoglycemia may be caused by:  A tumor in the pancreas.  Not eating enough, or not eating for long  periods at a time (fasting).  A severe infection or illness.  Certain medicines. What increases the risk? Hypoglycemia is more likely to develop in:  People who have diabetes and take medicines to lower blood glucose.  People who abuse alcohol.  People who have a severe illness. What are the signs or symptoms? Mild symptoms Mild hypoglycemia may not cause any symptoms. If you do have symptoms, they may include:  Hunger.  Anxiety.  Sweating and feeling clammy.  Dizziness or feeling light-headed.  Sleepiness.  Nausea.  Increased heart rate.  Headache.  Blurry vision.  Irritability.  Tingling or numbness around the mouth, lips, or tongue.  A change in coordination.  Restless sleep. Moderate symptoms Moderate hypoglycemia can cause:  Mental confusion and poor judgment.  Behavior changes.  Weakness.  Irregular heartbeat. Severe symptoms Severe hypoglycemia is a medical emergency. It can cause:  Fainting.  Seizures.  Loss of consciousness (coma).  Death. How is this diagnosed? Hypoglycemia is diagnosed with a blood test to measure your blood glucose level. This blood test is done while you are having symptoms. Your health care provider may also do a physical exam and review your medical history. How is this treated? This condition can often be treated by immediately eating or drinking something that contains sugar, such as:  Fruit juice, 4-6 oz (120-150 mL).  Regular soda (not diet soda), 4-6 oz (120-150 mL).  Low-fat milk, 4   oz (120 mL).  Several pieces of hard candy.  Sugar or honey, 1 Tbsp (15 mL). Treating hypoglycemia if you have diabetes If you are alert and able to swallow safely, follow the 15:15 rule:  Take 15 grams of a rapid-acting carbohydrate. Talk with your health care provider about how much you should take.  Rapid-acting options include: ? Glucose pills (take 15 grams). ? 6-8 pieces of hard candy. ? 4-6 oz (120-150 mL) of  fruit juice. ? 4-6 oz (120-150 mL) of regular (not diet) soda. ? 1 Tbsp (15 mL) honey or sugar.  Check your blood glucose 15 minutes after you take the carbohydrate.  If the repeat blood glucose level is still at or below 70 mg/dL (3.9 mmol/L), take 15 grams of a carbohydrate again.  If your blood glucose level does not increase above 70 mg/dL (3.9 mmol/L) after 3 tries, seek emergency medical care.  After your blood glucose level returns to normal, eat a meal or a snack within 1 hour.  Treating severe hypoglycemia Severe hypoglycemia is when your blood glucose level is at or below 54 mg/dL (3 mmol/L). Severe hypoglycemia is a medical emergency. Get medical help right away. If you have severe hypoglycemia and you cannot eat or drink, you may need an injection of glucagon. A family member or close friend should learn how to check your blood glucose and how to give you a glucagon injection. Ask your health care provider if you need to have an emergency glucagon injection kit available. Severe hypoglycemia may need to be treated in a hospital. The treatment may include getting glucose through an IV. You may also need treatment for the cause of your hypoglycemia. Follow these instructions at home:  General instructions  Take over-the-counter and prescription medicines only as told by your health care provider.  Monitor your blood glucose as told by your health care provider.  Limit alcohol intake to no more than 1 drink a day for nonpregnant women and 2 drinks a day for men. One drink equals 12 oz of beer (355 mL), 5 oz of wine (148 mL), or 1 oz of hard liquor (44 mL).  Keep all follow-up visits as told by your health care provider. This is important. If you have diabetes:  Always have a rapid-acting carbohydrate snack with you to treat low blood glucose.  Follow your diabetes management plan as directed. Make sure you: ? Know the symptoms of hypoglycemia. It is important to treat it  right away to prevent it from becoming severe. ? Take your medicines as directed. ? Follow your exercise plan. ? Follow your meal plan. Eat on time, and do not skip meals. ? Check your blood glucose as often as directed. Always check before and after exercise. ? Follow your sick day plan whenever you cannot eat or drink normally. Make this plan in advance with your health care provider.  Share your diabetes management plan with people in your workplace, school, and household.  Check your urine for ketones when you are ill and as told by your health care provider.  Carry a medical alert card or wear medical alert jewelry. Contact a health care provider if:  You have problems keeping your blood glucose in your target range.  You have frequent episodes of hypoglycemia. Get help right away if:  You continue to have hypoglycemia symptoms after eating or drinking something containing glucose.  Your blood glucose is at or below 54 mg/dL (3 mmol/L).  You have a seizure.  You faint. These symptoms may represent a serious problem that is an emergency. Do not wait to see if the symptoms will go away. Get medical help right away. Call your local emergency services (911 in the [place] Summary  Hypoglycemia occurs when the level of sugar (glucose) in the blood is too low.  Hypoglycemia can happen in people who do or do not have diabetes. It can develop quickly, and it can be a medical emergency.  Make sure you know the symptoms of hypoglycemia and how to treat it.  Always have a rapid-acting carbohydrate snack with you to treat low blood sugar. This information is not intended to replace advice given to you by your health care provider. Make sure you discuss any questions you have with your health care provider. Document Revised: 01/27/2018 Document Reviewed: 09/09/2015 [employer] Patient Education  2020 [employer]    If you have lab work done today you will be contacted with your lab  results within the next 2 weeks.  If you have not heard from us then please contact us. The fastest way to get your results is to register for My Chart.   IF you received an x-ray today, you will receive an invoice from [facility]. Please contact [facility] at [phone] with questions or concerns regarding your invoice.   IF you received labwork today, you will receive an invoice from [facility]. Please contact [facility] at [phone] with questions or concerns regarding your invoice.   Our billing staff will not be able to assist you with questions regarding bills from these companies.  You will be contacted with the lab results as soon as they are available. The fastest way to get your results is to activate your My Chart account. Instructions are located on the last page of this paperwork. If you have not heard from us regarding the results in 2 weeks, please contact this office.     

## 2019-11-26 NOTE — Progress Notes (Signed)
Subjective:  Patient ID: [name], male    DOB: 06-Feb-1946  Age: 74 y.o. MRN: [id number]  CC:  Chief Complaint  Patient presents with  . Diabetes    3 week follow-up. pt has been keeping trak of hid BS and has brought a log with him of hs BS and it loos like it is staying in the 200's but does come down sometimesas low as '157mg'$ /dl as of today. pt takes medication as prescribed. pt reports that sometimes his medication makes him feel sick.    HPI [name] presents for   Diabetes:  Associated with hyperglycemia, CKD, peripheral neuropathy..  See previous visits. Had been off meds d.t controlled A1c, then with increased readings/a1c meds restarted.  Some concern regarding medication adherence previously.  Pill minder/organizer recommended last visit.  Was continued on Metformin 850 mg twice daily.  Referred to diabetic nutritionist but no show for that appointment.  Wears hearing aids some difficulty with hearing aids. Will be going to audiology today to evaluate hearing aids. Questions repeated multiple times in visit with understanding expressed.   Home readings - fasting: 208, 206, 207, 225, 196, 235, 232, 224, 167, 187, 234, 154.  Still taking meds form bottles - did not pick up pill minder/pill box. Possible misplaced pill box.  Sometimes meds make him feel sick - stomach sore at times. None yet this week. Sometimes diarrhea - 3 times per month. No abd pain currently, none in past week at least. Does forget to take metformin at times, and stops taking it if stomach hurting. Sometimes only taking once per day.   [place] and glipizide used in past - [place] had been cost prohibitive in 2018.      Lab Results  Component Value Date   HGBA1C 8.3 (A) 09/24/2019   HGBA1C 9.8 (A) 07/03/2019   HGBA1C 6.0 (H) 03/09/2019   Lab Results  Component Value Date   MICROALBUR 0.8 04/19/2016   LDLCALC 48 03/09/2019   CREATININE 1.25 (H) 09/28/2019    History Patient  Active Problem List   Diagnosis Date Noted  . Iron deficiency anemia 11/04/2018  . Orthostatic hypotension 08/06/2018  . Hypertension 08/06/2018  . Hyperlipidemia 08/06/2018  . Pancytopenia ([place]) 08/06/2018  . 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 Insufficient treatment with nasal CPAP 05/05/2018  . 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 Irregular heart beat 01/28/2018  . Noncompliance with CPAP treatment 08/14/2017  . Excessive daytime sleepiness 04/09/2017  . Snoring 04/09/2017  . Memory impairment of gradual onset 04/09/2017  . Aortic atherosclerosis ([place]) 11/27/2016  . 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 Altered mental status 03/05/2013  . Respiratory failure, post-operative ([place]) 03/02/2013  . Hepatitis B 02/24/2013  . Cancer, hepatocellular ([place]) 02/10/2013  . Liver tumor-bleeding 01/15/2013  . Epidermal cyst 06/18/2012  . OSA (obstructive sleep apnea) 03/13/2012  . Abnormal leg movement 12/12/2011  . Blood pressure elevated 09/06/2011  . Diabetes mellitus ([place]) 09/06/2011  . 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 BPH (benign prostatic hyperplasia) 09/06/2011  . Diverticula of colon 09/06/2011   Past Medical History:  Diagnosis Date  . Cancer [facility])    hepatocellular cancer   . Diabetes mellitus without complication ([place])   . Dyspnea   . Hepatitis B   . Hyperlipidemia   . Hypertension   . Irregular heart beat 01/28/2018  . Narcolepsy    per office visit note of 08/2011   .  Sleep apnea    cpap   Past Surgical History:  Procedure Laterality Date  . CHOLECYSTECTOMY N/A 01/28/2018   Procedure: LAPAROSCOPIC CHOLECYSTECTOMY;  Surgeon: [name], MD;  Location: [facility];  Service: General;  Laterality: N/A;  . COLONOSCOPY WITH PROPOFOL N/A 08/27/2017   Procedure: COLONOSCOPY WITH PROPOFOL;  Surgeon: Clarene Essex, MD;  Location: [facility];  Service: Endoscopy;  Laterality: N/A;  . HOT  HEMOSTASIS N/A 08/27/2017   Procedure: HOT HEMOSTASIS (ARGON PLASMA COAGULATION/BICAP);  Surgeon: Clarene Essex, MD;  Location: [facility];  Service: Endoscopy;  Laterality: N/A;  . LAPAROSCOPIC CHOLECYSTECTOMY  01/28/2018  . LAPAROSCOPY  03/02/2013   Procedure: LAPAROSCOPY DIAGNOSTIC;  Surgeon: [name], MD;  Location: [facility];  Service: General;;  . LIVER ULTRASOUND  03/02/2013   Procedure: LIVER ULTRASOUND;  Surgeon: [name], MD;  Location: [facility];  Service: General;;  . OPEN PARTIAL HEPATECTOMY   03/02/2013   Procedure: OPEN PARTIAL HEPATECTOMY [83];  Surgeon: [name], MD;  Location: [facility];  Service: General;;  DX LAPAROSCOPY, INTRAOPERATIVE LIVER ULTRASOUND, OPEN PARTIAL HEPATECTOMY  . pinched nerve in back     Allergies  Allergen Reactions  . Ace Inhibitors Swelling and Other (See Comments)    Angioedema - face.    Prior to Admission medications   Medication Sig Start Date End Date Taking? Authorizing Provider  ACCU-CHEK SOFTCLIX LANCETS lancets Once per day testing. 08/05/17  Yes Wendie Agreste, MD  alfuzosin (UROXATRAL) 10 MG 24 hr tablet Take 1 tablet (10 mg total) by mouth at bedtime. 05/27/19  Yes Wendie Agreste, MD  amphetamine-dextroamphetamine (ADDERALL XR) 30 MG 24 hr capsule Take daily PRN by mouth with water 30 minutes before breakfast. 06/09/19  Yes Ward Givens, NP  blood glucose meter kit and supplies Dispense based on patient and insurance preference. Use once per day. 07/17/19  Yes Wendie Agreste, MD  donepezil (ARICEPT) 10 MG tablet Take 1 tablet (10 mg total) by mouth at bedtime. 06/09/19  Yes Ward Givens, NP  feeding supplement, ENSURE ENLIVE, (ENSURE ENLIVE) LIQD Take 237 mLs by mouth 2 (two) times daily between meals. 08/08/18  Yes Emokpae, Courage, MD  ferrous sulfate (KP FERROUS SULFATE) 325 (65 FE) MG tablet Take 1 tablet (325 mg total) by mouth 3 (three) times daily with meals. 08/08/18  Yes Emokpae, Courage, MD  glucose blood (ACCU-CHEK  AVIVA PLUS) test strip 1 each by Other route as needed for other. Use as instructed - once per day testing for now. 07/30/18  Yes Wendie Agreste, MD  metFORMIN (GLUCOPHAGE) 850 MG tablet Take 1 tablet (850 mg total) by mouth 2 (two) times daily with a meal. 09/24/19  Yes Wendie Agreste, MD  Misc. Devices (PILL BOX 7 DAY) MISC 1 Device by Does not apply route daily. Have assistant fill pill box with daily meds. 11/05/19  Yes Wendie Agreste, MD  Multiple Vitamin (MULTIVITAMIN WITH MINERALS) TABS tablet Take 1 tablet by mouth daily. 08/09/18  Yes Emokpae, Courage, MD  pravastatin (PRAVACHOL) 20 MG tablet Take 1 tablet (20 mg total) by mouth at bedtime. 05/27/19  Yes Wendie Agreste, MD  psyllium (METAMUCIL) 58.6 % powder Take 1 packet by mouth daily. Member purchased over-the-counter 09/22/18  Yes [provider]  sertraline (ZOLOFT) 25 MG tablet Take 1 tablet (25 mg total) by mouth daily. 05/27/19  Yes Wendie Agreste, MD  spironolactone (ALDACTONE) 25 MG tablet Take 1 tablet (25 mg total) by mouth daily. 05/27/19  Yes  Wendie Agreste, MD   Social History   Socioeconomic History  . Marital status: Divorced    Spouse name: Not on file  . Number of children: 2  . Years of education: 15+  . Highest education level: Bachelor's degree (e.g., BA, AB, BS)  Occupational History  . Not on file  Tobacco Use  . Smoking status: Former Smoker    Years: 5.00    Types: Cigarettes  . Smokeless tobacco: Never Used  Substance and Sexual Activity  . Alcohol use: No  . Drug use: No  . Sexual activity: Not Currently  Other Topics Concern  . Not on file  Social History Narrative   Patient is single and lives alone- became widower when 1 child was 21 year old, divorced 2nd marriage   Has #2 grown children, not in area or involved in his life   Patient is retired.   Patient has a college education.   Patient is right-handed.   Patient drinks maybe two sodas daily.   Social Determinants of  Health   Financial Resource Strain:   . Difficulty of Paying Living Expenses:   Food Insecurity:   . Worried About Charity fundraiser in the Last Year:   . Arboriculturist in the Last Year:   Transportation Needs:   . Film/video editor (Medical):   [name] Kitchen Lack of Transportation (Non-Medical):   Physical Activity:   . Days of Exercise per Week:   . Minutes of Exercise per Session:   Stress:   . Feeling of Stress :   Social Connections:   . Frequency of Communication with Friends and Family:   . Frequency of Social Gatherings with Friends and Family:   . Attends Religious Services:   . Active Member of Clubs or Organizations:   . Attends Archivist Meetings:   [name] Kitchen Marital Status:   Intimate Partner Violence:   . Fear of Current or Ex-Partner:   . Emotionally Abused:   [name] Kitchen Physically Abused:   . Sexually Abused:     Review of Systems Episodic abdominal discomfort, diarrhea per HPI.  Objective:   Vitals:   11/26/19 1137  BP: 123/63  Pulse: 85  Temp: 97.9 F (36.6 C)  TempSrc: Temporal  SpO2: 99%  Weight: 191 lb 9.6 oz (86.9 kg)  Height: '5\' 9"'$  (1.753 m)     Physical Exam Vitals reviewed.  Constitutional:      Appearance: He is well-developed.  HENT:     Head: Normocephalic and atraumatic.  Eyes:     Pupils: Pupils are equal, round, and reactive to light.  Neck:     Vascular: No carotid bruit or JVD.  Cardiovascular:     Rate and Rhythm: Normal rate and regular rhythm.     Heart sounds: Normal heart sounds. No murmur.  Pulmonary:     Effort: Pulmonary effort is normal.     Breath sounds: Normal breath sounds. No rales.  Abdominal:     General: Abdomen is flat.     Palpations: Abdomen is soft.     Tenderness: There is no abdominal tenderness.  Musculoskeletal:     Right lower leg: No edema.     Left lower leg: No edema.  Skin:    General: Skin is warm and dry.  Neurological:     Mental Status: He is alert and oriented to person, place, and  time.  Psychiatric:        Mood and Affect: Mood normal.  Behavior: Behavior normal.      Assessment & Plan:  [name] is a 74 y.o. male . Type 2 diabetes mellitus with hyperglycemia, without long-term current use of insulin ([place]) - Plan: glucose blood test strip, glipiZIDE (GLUCOTROL) 5 MG tablet, metFORMIN (GLUCOPHAGE) 500 MG tablet  -Unfortunately has not had a chance to use the pill minder/pillbox to help with medication adherence.  However has missed some doses with diarrhea or intolerance likely.  Previous regimen of Metformin, Januvia and glipizide but Januvia was cost prohibitive.  -Lower Metformin dose to 500 mg twice daily, new prescription for pillbox/pill minder and stressed importance of use, add glipizide 5 mg daily with hypoglycemic precautions.  Recheck 3 weeks with home readings.  -Understanding expressed after repeat discussion and speaking louder.  He does plan on having hearing aids checked soon.  Denies new memory issues, but would consider follow-up with neuro sooner if change in memory.  Meds ordered this encounter  Medications  . glucose blood test strip    Sig: Use as instructed    Dispense:  100 each    Refill:  12  . glipiZIDE (GLUCOTROL) 5 MG tablet    Sig: Take 1 tablet (5 mg total) by mouth daily before breakfast.    Dispense:  30 tablet    Refill:  0  . metFORMIN (GLUCOPHAGE) 500 MG tablet    Sig: Take 1 tablet (500 mg total) by mouth 2 (two) times daily with a meal.    Dispense:  180 tablet    Refill:  1   Patient Instructions    I will lower metformin dose - take '500mg'$  pill twice per day. If you continue to have diarrhea or stomache upset on that dose, let me know.   Start glipizide '5mg'$  one pill per day for diabetes. . Watch for any low blood sugars with this change (see precautions below).  If that occurs, let me know.   It is very important that you use a pillbox for medicines so you do not forget to take them or accidentally  take too many.   recheck with home readings again in 3 weeks.   If you are having more trouble with your memory, I would like you to meet back with neurologist.   Have hearing aids checked, but let me know if I can help.   Thanks for coming in today.    Return to the clinic or go to the nearest emergency room if any of your symptoms worsen or new symptoms occur.   Hypoglycemia Hypoglycemia occurs when the level of sugar (glucose) in the blood is too low. Hypoglycemia can happen in people who do or do not have diabetes. It can develop quickly, and it can be a medical emergency. For most people with diabetes, a blood glucose level below 70 mg/dL (3.9 mmol/L) is considered hypoglycemia. Glucose is a type of sugar that provides the body's main source of energy. Certain hormones (insulin and glucagon) control the level of glucose in the blood. Insulin lowers blood glucose, and glucagon raises blood glucose. Hypoglycemia can result from having too much insulin in the bloodstream, or from not eating enough food that contains glucose. You may also have reactive hypoglycemia, which happens within 4 hours after eating a meal. What are the causes? Hypoglycemia occurs most often in people who have diabetes and may be caused by:  Diabetes medicine.  Not eating enough, or not eating often enough.  Increased physical activity.  Drinking alcohol on  an empty stomach. If you do not have diabetes, hypoglycemia may be caused by:  A tumor in the pancreas.  Not eating enough, or not eating for long periods at a time (fasting).  A severe infection or illness.  Certain medicines. What increases the risk? Hypoglycemia is more likely to develop in:  People who have diabetes and take medicines to lower blood glucose.  People who abuse alcohol.  People who have a severe illness. What are the signs or symptoms? Mild symptoms Mild hypoglycemia may not cause any symptoms. If you do have symptoms, they  may include:  Hunger.  Anxiety.  Sweating and feeling clammy.  Dizziness or feeling light-headed.  Sleepiness.  Nausea.  Increased heart rate.  Headache.  Blurry vision.  Irritability.  Tingling or numbness around the mouth, lips, or tongue.  A change in coordination.  Restless sleep. Moderate symptoms Moderate hypoglycemia can cause:  Mental confusion and poor judgment.  Behavior changes.  Weakness.  Irregular heartbeat. Severe symptoms Severe hypoglycemia is a medical emergency. It can cause:  Fainting.  Seizures.  Loss of consciousness (coma).  Death. How is this diagnosed? Hypoglycemia is diagnosed with a blood test to measure your blood glucose level. This blood test is done while you are having symptoms. Your health care provider may also do a physical exam and review your medical history. How is this treated? This condition can often be treated by immediately eating or drinking something that contains sugar, such as:  Fruit juice, 4-6 oz (120-150 mL).  Regular soda (not diet soda), 4-6 oz (120-150 mL).  Low-fat milk, 4 oz (120 mL).  Several pieces of hard candy.  Sugar or honey, 1 Tbsp (15 mL). Treating hypoglycemia if you have diabetes If you are alert and able to swallow safely, follow the 15:15 rule:  Take 15 grams of a rapid-acting carbohydrate. Talk with your health care provider about how much you should take.  Rapid-acting options include: ? Glucose pills (take 15 grams). ? 6-8 pieces of hard candy. ? 4-6 oz (120-150 mL) of fruit juice. ? 4-6 oz (120-150 mL) of regular (not diet) soda. ? 1 Tbsp (15 mL) honey or sugar.  Check your blood glucose 15 minutes after you take the carbohydrate.  If the repeat blood glucose level is still at or below 70 mg/dL (3.9 mmol/L), take 15 grams of a carbohydrate again.  If your blood glucose level does not increase above 70 mg/dL (3.9 mmol/L) after 3 tries, seek emergency medical  care.  After your blood glucose level returns to normal, eat a meal or a snack within 1 hour.  Treating severe hypoglycemia Severe hypoglycemia is when your blood glucose level is at or below 54 mg/dL (3 mmol/L). Severe hypoglycemia is a medical emergency. Get medical help right away. If you have severe hypoglycemia and you cannot eat or drink, you may need an injection of glucagon. A family member or close friend should learn how to check your blood glucose and how to give you a glucagon injection. Ask your health care provider if you need to have an emergency glucagon injection kit available. Severe hypoglycemia may need to be treated in a hospital. The treatment may include getting glucose through an IV. You may also need treatment for the cause of your hypoglycemia. Follow these instructions at home:  General instructions  Take over-the-counter and prescription medicines only as told by your health care provider.  Monitor your blood glucose as told by your health care provider.  Limit alcohol intake to no more than 1 drink a day for nonpregnant women and 2 drinks a day for men. One drink equals 12 oz of beer (355 mL), 5 oz of wine (148 mL), or 1 oz of hard liquor (44 mL).  Keep all follow-up visits as told by your health care provider. This is important. If you have diabetes:  Always have a rapid-acting carbohydrate snack with you to treat low blood glucose.  Follow your diabetes management plan as directed. Make sure you: ? Know the symptoms of hypoglycemia. It is important to treat it right away to prevent it from becoming severe. ? Take your medicines as directed. ? Follow your exercise plan. ? Follow your meal plan. Eat on time, and do not skip meals. ? Check your blood glucose as often as directed. Always check before and after exercise. ? Follow your sick day plan whenever you cannot eat or drink normally. Make this plan in advance with your health care provider.  Share your  diabetes management plan with people in your workplace, school, and household.  Check your urine for ketones when you are ill and as told by your health care provider.  Carry a medical alert card or wear medical alert jewelry. Contact a health care provider if:  You have problems keeping your blood glucose in your target range.  You have frequent episodes of hypoglycemia. Get help right away if:  You continue to have hypoglycemia symptoms after eating or drinking something containing glucose.  Your blood glucose is at or below 54 mg/dL (3 mmol/L).  You have a seizure.  You faint. These symptoms may represent a serious problem that is an emergency. Do not wait to see if the symptoms will go away. Get medical help right away. Call your local emergency services (911 in the [place] Summary  Hypoglycemia occurs when the level of sugar (glucose) in the blood is too low.  Hypoglycemia can happen in people who do or do not have diabetes. It can develop quickly, and it can be a medical emergency.  Make sure you know the symptoms of hypoglycemia and how to treat it.  Always have a rapid-acting carbohydrate snack with you to treat low blood sugar. This information is not intended to replace advice given to you by your health care provider. Make sure you discuss any questions you have with your health care provider. Document Revised: 01/27/2018 Document Reviewed: 09/09/2015 Elsevier Patient Education  [employer].    If you have lab work done today you will be contacted with your lab results within the next 2 weeks.  If you have not heard from [place] then please contact us. The fastest way to get your results is to register for My Chart.   IF you received an x-ray today, you will receive an invoice from [facility]. Please contact [name] at [phone] with questions or concerns regarding your invoice.   IF you received labwork today, you will receive an invoice  from [place]. Please contact [facility] at [phone] with questions or concerns regarding your invoice.   Our billing staff will not be able to assist you with questions regarding bills from these companies.  You will be contacted with the lab results as soon as they are available. The fastest way to get your results is to activate your My Chart account. Instructions are located on the last page of this paperwork. If you have not heard from [place] regarding the results in 2 weeks, please  contact this office.         Signed, [name], MD [facility]

## 2019-12-17 ENCOUNTER — Encounter: Payer: Self-pay | Admitting: Family Medicine

## 2019-12-17 ENCOUNTER — Ambulatory Visit (INDEPENDENT_AMBULATORY_CARE_PROVIDER_SITE_OTHER): Payer: Medicare Other | Admitting: Family Medicine

## 2019-12-17 ENCOUNTER — Other Ambulatory Visit: Payer: Self-pay

## 2019-12-17 VITALS — BP 134/70 | HR 100 | Temp 98.6°F | Ht 69.0 in | Wt 193.0 lb

## 2019-12-17 DIAGNOSIS — C220 Liver cell carcinoma: Secondary | ICD-10-CM | POA: Diagnosis not present

## 2019-12-17 DIAGNOSIS — H9190 Unspecified hearing loss, unspecified ear: Secondary | ICD-10-CM

## 2019-12-17 DIAGNOSIS — R110 Nausea: Secondary | ICD-10-CM

## 2019-12-17 DIAGNOSIS — E1165 Type 2 diabetes mellitus with hyperglycemia: Secondary | ICD-10-CM

## 2019-12-17 LAB — GLUCOSE, POCT (MANUAL RESULT ENTRY): POC Glucose: 171 mg/dl — AB (ref 70–99)

## 2019-12-17 NOTE — Patient Instructions (Addendum)
  New hearing aid should help. Please have your help at home look at medicines and make sure those are being filled into pill box correctly.  I will check some labs for nausea/sick feeling. If that continues, I want you to meet with gastroenterolgy (stomach specialist), or discuss with oncologist as some of your meds including iron could be causing some of those symptoms. Continue metformin 500mg  twice per day, glipizide once per day for now. If metformin causes nausea, let me know and we can change meds again.    Return to the clinic or go to the nearest emergency room if any of your symptoms worsen or new symptoms occur.   If you have lab work done today you will be contacted with your lab results within the next 2 weeks.  If you have not heard from [place] then please contact us. The fastest way to get your results is to register for My Chart.   IF you received an x-ray today, you will receive an invoice from [facility]. Please contact [facility] at [phone] with questions or concerns regarding your invoice.   IF you received labwork today, you will receive an invoice from [facility]. Please contact [facility] at [phone] with questions or concerns regarding your invoice.   Our billing staff will not be able to assist you with questions regarding bills from these companies.  You will be contacted with the lab results as soon as they are available. The fastest way to get your results is to activate your My Chart account. Instructions are located on the last page of this paperwork. If you have not heard from [place] regarding the results in 2 weeks, please contact this office.

## 2019-12-17 NOTE — Progress Notes (Signed)
Subjective:  Patient ID: [name], male    DOB: [birth year]  Age: 74 y.o. MRN: [id number]  CC:  Chief Complaint  Patient presents with  . Follow-up    on diabetes. pt states no issues with this condition. he checks his BS every morning todays reading was 175m/dl this was a non-fasting reading. pt Reports his medication makes him sick. he isn't sure which one. but their are time the pt skips taking any of his medications due to feeling sick.    HPI [name] for   Diabetes: Complicated by hyperglycemia, chronic kidney disease, peripheral neuropathy.  See last visit April 8.  Intermittent dosing of Metformin times, some possible GI intolerance at times.  Januvia, glipizide had been used in the past, Januvia cost prohibitive in 2018.  We did mention previous visits to use a pill minder to help with medication adherence.  Metformin was lowered to 500 mg dosing for improved tolerance but stressed importance of twice daily dosing.  Glipizide 5 mg daily added with hypoglycemic precautions. Using pillbox/minder now, but did not bring to office.  Not taking glipizide every day - 4-5 days per week. Sometimes not eating breakfast.  Taking metformin new dose 5060mBID. Feels sick on stomach with some medicine. Not sure which one, not new symptoms, intermittent nausea - few days per week. No vomiting. Does take meds with food in the morning, oral iron 3 times per day.  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 S/p partial left hepatectomy in 2014.  CT abd/pelvis 09/28/19 - no evidence of recurrence. Stable nonocclusive thrombus main portal vein, splenomegaly, portal venous hypertension.   Lab Results  Component Value Date   ALT 23 09/28/2019   AST 27 09/28/2019   ALKPHOS 117 09/28/2019   BILITOT 1.3 (H) 09/28/2019   Has help at home 3 hrs every other day.   Wears hearing aid - still difficulty hearing. Typed questions with understanding expressed.    New hearing aid in next few weeks.   Home readings: 117 this am. Unsure of fasting. No symptomatic lows.     Lab Results  Component Value Date   HGBA1C 8.3 (A) 09/24/2019   HGBA1C 9.8 (A) 07/03/2019   HGBA1C 6.0 (H) 03/09/2019   Lab Results  Component Value Date   MICROALBUR 0.8 04/19/2016   LDLCALC 48 03/09/2019   CREATININE 1.25 (H) 09/28/2019    History Patient Active Problem List   Diagnosis Date Noted  . Iron deficiency anemia 11/04/2018  . Orthostatic hypotension 08/06/2018  . Hypertension 08/06/2018  . Hyperlipidemia 08/06/2018  . Pancytopenia (HCFrio12/18/2019  . 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 Insufficient treatment with nasal CPAP 05/05/2018  . 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 Irregular heart beat 01/28/2018  . Noncompliance with CPAP treatment 08/14/2017  . Excessive daytime sleepiness 04/09/2017  . Snoring 04/09/2017  . Memory impairment of gradual onset 04/09/2017  . Aortic atherosclerosis (H[place]04/05/2017  . 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 Altered mental status 03/05/2013  . Respiratory failure, post-operative (HC[place]07/14/2014  . Hepatitis B 02/24/2013  . Cancer, hepatocellular (HC[place]06/24/2014  . Liver tumor-bleeding 01/15/2013  . Epidermal cyst 06/18/2012  . OSA (obstructive sleep apnea) 03/13/2012  . Abnormal leg movement 12/12/2011  . Blood pressure elevated 09/06/2011  . Diabetes mellitus (HCBeverly [place]01/17/2013  . 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 BPH (benign prostatic hyperplasia) 09/06/2011  . Diverticula of  colon 09/06/2011   Past Medical History:  Diagnosis Date  . Cancer [facility])    hepatocellular cancer   . Diabetes mellitus without complication ([place])   . Dyspnea   . Hepatitis B   . Hyperlipidemia   . Hypertension   . Irregular heart beat 01/28/2018  . Narcolepsy    per office visit note of 08/2011   . Sleep apnea    cpap   Past  Surgical History:  Procedure Laterality Date  . CHOLECYSTECTOMY N/A 01/28/2018   Procedure: LAPAROSCOPIC CHOLECYSTECTOMY;  Surgeon: [name], MD;  Location: [facility];  Service: General;  Laterality: N/A;  . COLONOSCOPY WITH PROPOFOL N/A 08/27/2017   Procedure: COLONOSCOPY WITH PROPOFOL;  Surgeon: Clarene Essex, MD;  Location: [facility];  Service: Endoscopy;  Laterality: N/A;  . HOT HEMOSTASIS N/A 08/27/2017   Procedure: HOT HEMOSTASIS (ARGON PLASMA COAGULATION/BICAP);  Surgeon: Clarene Essex, MD;  Location[facility];  Service: Endoscopy;  Laterality: N/A;  . LAPAROSCOPIC CHOLECYSTECTOMY  01/28/2018  . LAPAROSCOPY  03/02/2013   Procedure: LAPAROSCOPY DIAGNOSTIC;  Surgeon: [name], MD;  Location: [facility];  Service: General;;  . LIVER ULTRASOUND  03/02/2013   Procedure: LIVER ULTRASOUND;  Surgeon: [name], MD;  Location: [facility];  Service: General;;  . OPEN PARTIAL HEPATECTOMY   03/02/2013   Procedure: OPEN PARTIAL HEPATECTOMY [83];  Surgeon: [name], MD;  Location: [facility];  Service: General;;  DX LAPAROSCOPY, INTRAOPERATIVE LIVER ULTRASOUND, OPEN PARTIAL HEPATECTOMY  . pinched nerve in back     Allergies  Allergen Reactions  . Ace Inhibitors Swelling and Other (See Comments)    Angioedema - face.    Prior to Admission medications   Medication Sig Start Date End Date Taking? Authorizing Provider  ACCU-CHEK SOFTCLIX LANCETS lancets Once per day testing. 08/05/17  Yes Wendie Agreste, MD  alfuzosin (UROXATRAL) 10 MG 24 hr tablet Take 1 tablet (10 mg total) by mouth at bedtime. 05/27/19  Yes Wendie Agreste, MD  amphetamine-dextroamphetamine (ADDERALL XR) 30 MG 24 hr capsule Take daily PRN by mouth with water 30 minutes before breakfast. 06/09/19  Yes Ward Givens, NP  blood glucose meter kit and supplies Dispense based on patient and insurance preference. Use once per day. 07/17/19  Yes Wendie Agreste, MD  donepezil (ARICEPT) 10 MG tablet Take 1 tablet (10 mg total) by mouth  at bedtime. 06/09/19  Yes Ward Givens, NP  feeding supplement, ENSURE ENLIVE, (ENSURE ENLIVE) LIQD Take 237 mLs by mouth 2 (two) times daily between meals. 08/08/18  Yes Emokpae, Courage, MD  ferrous sulfate (KP FERROUS SULFATE) 325 (65 FE) MG tablet Take 1 tablet (325 mg total) by mouth 3 (three) times daily with meals. 08/08/18  Yes Emokpae, Courage, MD  glipiZIDE (GLUCOTROL) 5 MG tablet Take 1 tablet (5 mg total) by mouth daily before breakfast. 11/26/19  Yes Wendie Agreste, MD  glucose blood (ACCU-CHEK AVIVA PLUS) test strip 1 each by Other route as needed for other. Use as instructed - once per day testing for now. 07/30/18  Yes Wendie Agreste, MD  glucose blood test strip Use as instructed 11/26/19  Yes Wendie Agreste, MD  metFORMIN (GLUCOPHAGE) 500 MG tablet Take 1 tablet (500 mg total) by mouth 2 (two) times daily with a meal. 11/26/19  Yes Wendie Agreste, MD  Misc. Devices (PILL BOX 7 DAY) MISC 1 Device by Does not apply route daily. Have assistant fill pill box with daily meds. 11/05/19  Yes Wendie Agreste,  MD  Multiple Vitamin (MULTIVITAMIN WITH MINERALS) TABS tablet Take 1 tablet by mouth daily. 08/09/18  Yes Emokpae, Courage, MD  pravastatin (PRAVACHOL) 20 MG tablet Take 1 tablet (20 mg total) by mouth at bedtime. 05/27/19  Yes Wendie Agreste, MD  psyllium (METAMUCIL) 58.6 % powder Take 1 packet by mouth daily. Member purchased over-the-counter 09/22/18  Yes [provider]  sertraline (ZOLOFT) 25 MG tablet Take 1 tablet (25 mg total) by mouth daily. 05/27/19  Yes Wendie Agreste, MD  spironolactone (ALDACTONE) 25 MG tablet Take 1 tablet (25 mg total) by mouth daily. 05/27/19  Yes Wendie Agreste, MD   Social History   Socioeconomic History  . Marital status: Divorced    Spouse name: Not on file  . Number of children: 2  . Years of education: 15+  . Highest education level: Bachelor's degree (e.g., BA, AB, BS)  Occupational History  . Not on file  Tobacco  Use  . Smoking status: Former Smoker    Years: 5.00    Types: Cigarettes  . Smokeless tobacco: Never Used  Substance and Sexual Activity  . Alcohol use: No  . Drug use: No  . Sexual activity: Not Currently  Other Topics Concern  . Not on file  Social History Narrative   Patient is single and lives alone- became widower when 1 child was 15 year old, divorced 2nd marriage   Has #2 grown children, not in area or involved in his life   Patient is retired.   Patient has a college education.   Patient is right-handed.   Patient drinks maybe two sodas daily.   Social Determinants of Health   Financial Resource Strain:   . Difficulty of Paying Living Expenses:   Food Insecurity:   . Worried About Charity fundraiser in the Last Year:   . Arboriculturist in the Last Year:   Transportation Needs:   . Film/video editor (Medical):   [name] Kitchen Lack of Transportation (Non-Medical):   Physical Activity:   . Days of Exercise per Week:   . Minutes of Exercise per Session:   Stress:   . Feeling of Stress :   Social Connections:   . Frequency of Communication with Friends and Family:   . Frequency of Social Gatherings with Friends and Family:   . Attends Religious Services:   . Active Member of Clubs or Organizations:   . Attends Archivist Meetings:   [name] Kitchen Marital Status:   Intimate Partner Violence:   . Fear of Current or Ex-Partner:   . Emotionally Abused:   [name] Kitchen Physically Abused:   . Sexually Abused:     Review of Systems   Objective:   Vitals:   12/17/19 1149  BP: 134/70  Pulse: 100  Temp: 98.6 F (37 C)  TempSrc: Temporal  SpO2: 99%  Weight: 193 lb (87.5 kg)  Height: _0  (1.753 m)     Physical Exam Vitals reviewed.  Constitutional:      Appearance: He is well-developed.  HENT:     Head: Normocephalic and atraumatic.  Eyes:     Pupils: Pupils are equal, round, and reactive to light.  Neck:     Vascular: No carotid bruit or JVD.  Cardiovascular:      Rate and Rhythm: Normal rate and regular rhythm.     Heart sounds: Normal heart sounds. No murmur.  Pulmonary:     Effort: Pulmonary effort is normal.  Breath sounds: Normal breath sounds. No rales.  Abdominal:     Tenderness: There is no abdominal tenderness. There is no guarding or rebound.  Skin:    General: Skin is warm and dry.  Neurological:     Mental Status: He is alert and oriented to person, place, and time.        Assessment & Plan:  [name] is a 75 y.o. male . Type 2 diabetes mellitus with hyperglycemia, without long-term current use of insulin (HCC) - Plan: POCT glucose (manual entry)  Nausea without vomiting - Plan: Lipase  Hearing difficulty, unspecified laterality  Cancer, hepatocellular ([place])  Improved home reading this morning. From the best I can tell on our history he is taking Metformin both in the morning and night as well as glipizide when he does eat breakfast. No symptomatic hypoglycemia  Still question some of the adherence to meds and does not have pill minder with him today. Recommended that he have his help at home evaluate the pill minder and his medications and make sure that he is obtaining medication appropriately. Of note he did have multiple bottles of the same medication with him today.  Episodic nausea, longstanding symptoms per patient. Could be related to iron, could be related to Metformin. Possible other medication. Check lipase, then GI eval or coordination with oncology to discuss medication adjustments if persistent. Recheck 3 weeks.   Some difficulty with history due to hearing difficulty. Ultimately typed out questions and he was able to read with understanding expressed. Reportedly may have a new hearing aid in the next few weeks.   No orders of the defined types were placed in this encounter.  Patient Instructions    New hearing aid should help. Please have your help at home look at medicines and make sure those  are being filled into pill box correctly.  I will check some labs for nausea/sick feeling. If that continues, I want you to meet with gastroenterolgy (stomach specialist), or discuss with oncologist as some of your meds including iron could be causing some of those symptoms. Continue metformin 582m twice per day, glipizide once per day for now. If metformin causes nausea, let me know and we can change meds again.    Return to the clinic or go to the nearest emergency room if any of your symptoms worsen or new symptoms occur.   If you have lab work done today you will be contacted with your lab results within the next 2 weeks.  If you have not heard from [place] please contact [place] The fastest way to get your results is to register for My Chart.   IF you received an x-ray today, you will receive an invoice from [facility]. Please contact [facility] at [phone] questions or concerns regarding your invoice.   IF you received labwork today, you will receive an invoice from [place] Please contact [facility] at [phone] questions or concerns regarding your invoice.   Our billing staff will not be able to assist you with questions regarding bills from these companies.  You will be contacted with the lab results as soon as they are available. The fastest way to get your results is to activate your My Chart account. Instructions are located on the last page of this paperwork. If you have not heard from [place]arding the results in 2 weeks, please contact this office.       Signed, JMerri Ray MD [facility]  [facility]  

## 2019-12-18 LAB — LIPASE: Lipase: 48 U/L (ref 13–78)

## 2019-12-25 ENCOUNTER — Ambulatory Visit (INDEPENDENT_AMBULATORY_CARE_PROVIDER_SITE_OTHER): Payer: Medicare Other | Admitting: Family Medicine

## 2019-12-25 ENCOUNTER — Other Ambulatory Visit: Payer: Self-pay

## 2019-12-25 ENCOUNTER — Encounter: Payer: Self-pay | Admitting: Family Medicine

## 2019-12-25 VITALS — BP 154/70 | HR 77 | Temp 97.9°F | Ht 69.0 in | Wt 191.2 lb

## 2019-12-25 DIAGNOSIS — R110 Nausea: Secondary | ICD-10-CM

## 2019-12-25 DIAGNOSIS — H9190 Unspecified hearing loss, unspecified ear: Secondary | ICD-10-CM

## 2019-12-25 DIAGNOSIS — E1165 Type 2 diabetes mellitus with hyperglycemia: Secondary | ICD-10-CM

## 2019-12-25 DIAGNOSIS — G3184 Mild cognitive impairment, so stated: Secondary | ICD-10-CM | POA: Diagnosis not present

## 2019-12-25 LAB — POCT GLYCOSYLATED HEMOGLOBIN (HGB A1C): Hemoglobin A1C: 6.6 % — AB (ref 4.0–5.6)

## 2019-12-25 LAB — GLUCOSE, POCT (MANUAL RESULT ENTRY): POC Glucose: 288 mg/dl — AB (ref 70–99)

## 2019-12-25 NOTE — Patient Instructions (Addendum)
  Continue Metformin twice per day, glipizide once per day.  I encourage you to use medication only other the pillbox/pill minder to minimize risk of overdose or taking the wrong medication.  I will also have a Education officer, museum meet with you to look to see if other options may be helpful for you.  I will refer you to gastroenterology to discuss the nausea.   Recheck 2 weeks for diabetes, blood pressure. Measure blood sugar daily - if over 200 - be seen sooner.   Return to the clinic or go to the nearest emergency room if any of your symptoms worsen or new symptoms occur.    If you have lab work done today you will be contacted with your lab results within the next 2 weeks.  If you have not heard from [place] then please contact us. The fastest way to get your results is to register for My Chart.   IF you received an x-ray today, you will receive an invoice from [facility]. Please contact [facility] at [phone] with questions or concerns regarding your invoice.   IF you received labwork today, you will receive an invoice from [place]. Please contact [facility] at [phone] with questions or concerns regarding your invoice.   Our billing staff will not be able to assist you with questions regarding bills from these companies.  You will be contacted with the lab results as soon as they are available. The fastest way to get your results is to activate your My Chart account. Instructions are located on the last page of this paperwork. If you have not heard from [place] regarding the results in 2 weeks, please contact this office.

## 2019-12-25 NOTE — Progress Notes (Signed)
Subjective:  Patient ID: [name], male    DOB: 06/26/1946  Age: 74 y.o. MRN: [id number]  CC:  Chief Complaint  Patient presents with  . Diabetes    1 week  f/u for diabetes still have some nausea on/off. Patient forgot meds to bring into the office.     HPI ALANTE TOLAN presents for   Diabetes, nausea.  Complicated by hyperglycemia, chronic kidney disease, peripheral neuropathy, some concern previously regarding medication adherence.  Has been continued on Metformin 500 mg twice daily, twice daily dosing stressed previously as well as use of pill bottles/pill keeper to help with adherence.  Also on glipizide 5 mg but only taking 4 to 5 days/week at April 29 visit.  Did note some nausea with one of his medicines but not sure which one.  Few days per week. History of hepatocellular carcinoma followed by hematology and oncology status post left partial hepatectomy in 2014 and no evidence of recurrence on CT abdomen pelvis in February of this year.  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 Recommended having the help of his house every other day look at his medications and help with remembering meds if needed. Lipase normal last visit, glucose 171  Hearing difficulty, waiting on new hearing aids.  History was obtained today with writing on paperwork as well as some speaking with understanding expressed  Reports having nausea once since last visit, no vomiting or abdominal pain.  Thinks he took some old medication but is unable to tell me which one.  Some difficulty with his memory(history of mild cognitive impairment with evaluation at neurology in October of last year).  He does report taking Metformin twice per day, not sure if he is taking glipizide.  He does have the pill minder box at home and states he is taking his medications from the pill minder box when he is home but then  when he is not at home or out of the house will take his medications from the actual pill bottles which he is brought in today.  Home blood sugar reportedly 117 but not sure of timing.  Some high readings but he is unable to tell me actual numbers.     Results for orders placed or performed in visit on 12/25/19  POCT glucose (manual entry)  Result Value Ref Range   POC Glucose 288 (A) 70 - 99 mg/dl  POCT glycosylated hemoglobin (Hb A1C)  Result Value Ref Range   Hemoglobin A1C 6.6 (A) 4.0 - 5.6 %   HbA1c POC (<> result, manual entry)     HbA1c, POC (prediabetic range)     HbA1c, POC (controlled diabetic range)           Lab Results  Component Value Date   HGBA1C 6.6 (A) 12/25/2019   HGBA1C 8.3 (A) 09/24/2019   HGBA1C 9.8 (A) 07/03/2019   Lab Results  Component Value Date   MICROALBUR 0.8 04/19/2016   LDLCALC 48 03/09/2019   CREATININE 1.25 (H) 09/28/2019      History Patient Active Problem List   Diagnosis Date Noted  . Iron deficiency anemia 11/04/2018  . Orthostatic hypotension 08/06/2018  . Hypertension 08/06/2018  . Hyperlipidemia 08/06/2018  . Pancytopenia ([place]) 08/06/2018  . 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 Insufficient treatment with nasal CPAP 05/05/2018  . 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 Chronic confusional  state 05/05/2018  . Irregular heart beat 01/28/2018  . Noncompliance with CPAP treatment 08/14/2017  . Excessive daytime sleepiness 04/09/2017  . Snoring 04/09/2017  . Memory impairment of gradual onset 04/09/2017  . Aortic atherosclerosis ([place]) 11/27/2016  . 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 Altered mental status 03/05/2013  . Respiratory failure, post-operative ([place]) 03/02/2013  . Hepatitis B 02/24/2013  . Cancer, hepatocellular ([place]) 02/10/2013  . Liver tumor-bleeding 01/15/2013  . Epidermal cyst 06/18/2012  . OSA (obstructive sleep apnea) 03/13/2012  . Abnormal leg  movement 12/12/2011  . Blood pressure elevated 09/06/2011  . Diabetes mellitus ([place]) 09/06/2011  . 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 BPH (benign prostatic hyperplasia) 09/06/2011  . Diverticula of colon 09/06/2011   Past Medical History:  Diagnosis Date  . Cancer [facility])    hepatocellular cancer   . Diabetes mellitus without complication ([place])   . Dyspnea   . Hepatitis B   . Hyperlipidemia   . Hypertension   . Irregular heart beat 01/28/2018  . Narcolepsy    per office visit note of 08/2011   . Sleep apnea    cpap   Past Surgical History:  Procedure Laterality Date  . CHOLECYSTECTOMY N/A 01/28/2018   Procedure: LAPAROSCOPIC CHOLECYSTECTOMY;  Surgeon: [name], MD;  Location: [facility];  Service: General;  Laterality: N/A;  . COLONOSCOPY WITH PROPOFOL N/A 08/27/2017   Procedure: COLONOSCOPY WITH PROPOFOL;  Surgeon: Clarene Essex, MD;  Location: [facility];  Service: Endoscopy;  Laterality: N/A;  . HOT HEMOSTASIS N/A 08/27/2017   Procedure: HOT HEMOSTASIS (ARGON PLASMA COAGULATION/BICAP);  Surgeon: Clarene Essex, MD;  Location: [facility];  Service: Endoscopy;  Laterality: N/A;  . LAPAROSCOPIC CHOLECYSTECTOMY  01/28/2018  . LAPAROSCOPY  03/02/2013   Procedure: LAPAROSCOPY DIAGNOSTIC;  Surgeon: [name], MD;  Location: [facility];  Service: General;;  . LIVER ULTRASOUND  03/02/2013   Procedure: LIVER ULTRASOUND;  Surgeon: [name], MD;  Location: [facility];  Service: General;;  . OPEN PARTIAL HEPATECTOMY   03/02/2013   Procedure: OPEN PARTIAL HEPATECTOMY [83];  Surgeon: [name], MD;  Location: [facility];  Service: General;;  DX LAPAROSCOPY, INTRAOPERATIVE LIVER ULTRASOUND, OPEN PARTIAL HEPATECTOMY  . pinched nerve in back     Allergies  Allergen Reactions  . Ace Inhibitors Swelling and Other (See Comments)    Angioedema - face.    Prior to Admission medications   Medication Sig Start Date End Date Taking? Authorizing Provider  ACCU-CHEK SOFTCLIX LANCETS lancets Once per day testing.  08/05/17  Yes Wendie Agreste, MD  alfuzosin (UROXATRAL) 10 MG 24 hr tablet Take 1 tablet (10 mg total) by mouth at bedtime. 05/27/19  Yes Wendie Agreste, MD  amphetamine-dextroamphetamine (ADDERALL XR) 30 MG 24 hr capsule Take daily PRN by mouth with water 30 minutes before breakfast. 06/09/19  Yes Ward Givens, NP  blood glucose meter kit and supplies Dispense based on patient and insurance preference. Use once per day. 07/17/19  Yes Wendie Agreste, MD  donepezil (ARICEPT) 10 MG tablet Take 1 tablet (10 mg total) by mouth at bedtime. 06/09/19  Yes Ward Givens, NP  feeding supplement, ENSURE ENLIVE, (ENSURE ENLIVE) LIQD Take 237 mLs by mouth 2 (two) times daily between meals. 08/08/18  Yes Emokpae, Courage, MD  ferrous sulfate (KP FERROUS SULFATE) 325 (65 FE) MG tablet Take 1 tablet (325 mg total) by mouth 3 (three) times daily with meals. 08/08/18  Yes Emokpae, Courage, MD  glipiZIDE (GLUCOTROL) 5 MG tablet Take 1  tablet (5 mg total) by mouth daily before breakfast. 11/26/19  Yes Wendie Agreste, MD  glucose blood (ACCU-CHEK AVIVA PLUS) test strip 1 each by Other route as needed for other. Use as instructed - once per day testing for now. 07/30/18  Yes Wendie Agreste, MD  glucose blood test strip Use as instructed 11/26/19  Yes Wendie Agreste, MD  metFORMIN (GLUCOPHAGE) 500 MG tablet Take 1 tablet (500 mg total) by mouth 2 (two) times daily with a meal. 11/26/19  Yes Wendie Agreste, MD  Misc. Devices (PILL BOX 7 DAY) MISC 1 Device by Does not apply route daily. Have assistant fill pill box with daily meds. 11/05/19  Yes Wendie Agreste, MD  Multiple Vitamin (MULTIVITAMIN WITH MINERALS) TABS tablet Take 1 tablet by mouth daily. 08/09/18  Yes Emokpae, Courage, MD  pravastatin (PRAVACHOL) 20 MG tablet Take 1 tablet (20 mg total) by mouth at bedtime. 05/27/19  Yes Wendie Agreste, MD  psyllium (METAMUCIL) 58.6 % powder Take 1 packet by mouth daily. Member purchased over-the-counter  09/22/18  Yes [provider]  sertraline (ZOLOFT) 25 MG tablet Take 1 tablet (25 mg total) by mouth daily. 05/27/19  Yes Wendie Agreste, MD  spironolactone (ALDACTONE) 25 MG tablet Take 1 tablet (25 mg total) by mouth daily. 05/27/19  Yes Wendie Agreste, MD   Social History   Socioeconomic History  . Marital status: Divorced    Spouse name: Not on file  . Number of children: 2  . Years of education: 15+  . Highest education level: Bachelor's degree (e.g., BA, AB, BS)  Occupational History  . Not on file  Tobacco Use  . Smoking status: Former Smoker    Years: 5.00    Types: Cigarettes  . Smokeless tobacco: Never Used  Substance and Sexual Activity  . Alcohol use: No  . Drug use: No  . Sexual activity: Not Currently  Other Topics Concern  . Not on file  Social History Narrative   Patient is single and lives alone- became widower when 1 child was 26 year old, divorced 2nd marriage   Has #2 grown children, not in area or involved in his life   Patient is retired.   Patient has a college education.   Patient is right-handed.   Patient drinks maybe two sodas daily.   Social Determinants of Health   Financial Resource Strain:   . Difficulty of Paying Living Expenses:   Food Insecurity:   . Worried About Charity fundraiser in the Last Year:   . Arboriculturist in the Last Year:   Transportation Needs:   . Film/video editor (Medical):   [name] Kitchen Lack of Transportation (Non-Medical):   Physical Activity:   . Days of Exercise per Week:   . Minutes of Exercise per Session:   Stress:   . Feeling of Stress :   Social Connections:   . Frequency of Communication with Friends and Family:   . Frequency of Social Gatherings with Friends and Family:   . Attends Religious Services:   . Active Member of Clubs or Organizations:   . Attends Archivist Meetings:   [name] Kitchen Marital Status:   Intimate Partner Violence:   . Fear of Current or Ex-Partner:   . Emotionally  Abused:   [name] Kitchen Physically Abused:   . Sexually Abused:     Review of Systems  HENT: Positive for hearing loss.   Gastrointestinal: Positive for nausea. Negative  for vomiting.   Per HPI  Objective:   Vitals:   12/25/19 0858 12/25/19 0908  BP: (!) 161/71 (!) 154/70  Pulse: 77   Temp: 97.9 F (36.6 C)   TempSrc: Temporal   SpO2: 99%   Weight: 191 lb 3.2 oz (86.7 kg)   Height: '5\' 9"'  (1.753 m)      Physical Exam Vitals reviewed.  Constitutional:      Appearance: He is well-developed.  HENT:     Head: Normocephalic and atraumatic.  Eyes:     Pupils: Pupils are equal, round, and reactive to light.  Neck:     Vascular: No carotid bruit or JVD.  Cardiovascular:     Rate and Rhythm: Normal rate and regular rhythm.     Heart sounds: Normal heart sounds. No murmur.  Pulmonary:     Effort: Pulmonary effort is normal.     Breath sounds: Normal breath sounds. No rales.  Abdominal:     General: There is no distension.     Tenderness: There is no abdominal tenderness.  Skin:    General: Skin is warm and dry.  Neurological:     Mental Status: He is alert and oriented to person, place, and time.     Results for orders placed or performed in visit on 12/25/19  POCT glucose (manual entry)  Result Value Ref Range   POC Glucose 288 (A) 70 - 99 mg/dl  POCT glycosylated hemoglobin (Hb A1C)  Result Value Ref Range   Hemoglobin A1C 6.6 (A) 4.0 - 5.6 %   HbA1c POC (<> result, manual entry)     HbA1c, POC (prediabetic range)     HbA1c, POC (controlled diabetic range)        Assessment & Plan:  KELYN KOSKELA is a 74 y.o. male . Type 2 diabetes mellitus with hyperglycemia, without long-term current use of insulin ([place]) - Plan: POCT glucose (manual entry), POCT glycosylated hemoglobin (Hb A1C), Ambulatory referral to Social Work  Hearing difficulty, unspecified laterality - Plan: Ambulatory referral to Social Work  Nausea without vomiting - Plan: Ambulatory referral to  Gastroenterology  Mild cognitive impairment - Plan: Ambulatory referral to Social Work  Still suspect some inconsistency with meds affecting control of DM and elevated BP in office. Social work referral to check home status. No med changes. Pilbox/minder use stressed. Recheck 2 weeks, rtc precautions given.   Plan for new hearing aids. Understanding expressed with written hx/plan.   Nausea  - still intermittent. ? Med related. 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    No orders of the defined types were placed in this encounter.  Patient Instructions    Continue Metformin twice per day, glipizide once per day.  I encourage you to use medication only other the pillbox/pill minder to minimize risk of overdose or taking the wrong medication.  I will also have a Education officer, museum meet with you to look to see if other options may be helpful for you.  I will refer you to gastroenterology to discuss the nausea.   Recheck 2 weeks for diabetes, blood pressure. Measure blood sugar daily - if over 200 - be seen sooner.   Return to the clinic or go to the nearest emergency room if any of your symptoms worsen or new symptoms occur.    If you have lab work done today you will  be contacted with your lab results within the next 2 weeks.  If you have not heard from [place] then please contact us. The fastest way to get your results is to register for My Chart.   IF you received an x-ray today, you will receive an invoice from [facility]. Please contact [facility] at [phone] with questions or concerns regarding your invoice.   IF you received labwork today, you will receive an invoice from [facility]. Please contact [facility] at [phone] with questions or concerns regarding your invoice.   Our billing staff will not be able to  assist you with questions regarding bills from these companies.  You will be contacted with the lab results as soon as they are available. The fastest way to get your results is to activate your My Chart account. Instructions are located on the last page of this paperwork. If you have not heard from [place] regarding the results in 2 weeks, please contact this office.          Signed, [name], MD [facility]

## 2019-12-26 ENCOUNTER — Encounter: Payer: Self-pay | Admitting: Family Medicine

## 2020-01-08 ENCOUNTER — Telehealth: Payer: Self-pay | Admitting: Family Medicine

## 2020-01-08 ENCOUNTER — Other Ambulatory Visit: Payer: Self-pay

## 2020-01-08 ENCOUNTER — Ambulatory Visit (INDEPENDENT_AMBULATORY_CARE_PROVIDER_SITE_OTHER): Payer: Medicare Other | Admitting: Family Medicine

## 2020-01-08 ENCOUNTER — Encounter: Payer: Self-pay | Admitting: Family Medicine

## 2020-01-08 VITALS — BP 145/75 | HR 64 | Temp 97.8°F | Resp 15 | Ht 69.0 in | Wt 195.8 lb

## 2020-01-08 DIAGNOSIS — N400 Enlarged prostate without lower urinary tract symptoms: Secondary | ICD-10-CM | POA: Diagnosis not present

## 2020-01-08 DIAGNOSIS — H9190 Unspecified hearing loss, unspecified ear: Secondary | ICD-10-CM

## 2020-01-08 DIAGNOSIS — E1165 Type 2 diabetes mellitus with hyperglycemia: Secondary | ICD-10-CM

## 2020-01-08 DIAGNOSIS — I10 Essential (primary) hypertension: Secondary | ICD-10-CM

## 2020-01-08 DIAGNOSIS — E785 Hyperlipidemia, unspecified: Secondary | ICD-10-CM | POA: Diagnosis not present

## 2020-01-08 DIAGNOSIS — F32A Depression, unspecified: Secondary | ICD-10-CM

## 2020-01-08 DIAGNOSIS — F329 Major depressive disorder, single episode, unspecified: Secondary | ICD-10-CM

## 2020-01-08 MED ORDER — METFORMIN HCL 500 MG PO TABS: 500.0000 mg | ORAL_TABLET | Freq: Two times a day (BID) | ORAL | 1 refills | Status: DC

## 2020-01-08 MED ORDER — SPIRONOLACTONE 25 MG PO TABS: 25.0000 mg | ORAL_TABLET | Freq: Every day | ORAL | 1 refills | Status: DC

## 2020-01-08 MED ORDER — GLIPIZIDE 5 MG PO TABS: 5.0000 mg | ORAL_TABLET | Freq: Every day | ORAL | 1 refills | Status: DC

## 2020-01-08 MED ORDER — PRAVASTATIN SODIUM 20 MG PO TABS: 20.0000 mg | ORAL_TABLET | Freq: Every day | ORAL | 1 refills | Status: DC

## 2020-01-08 MED ORDER — SERTRALINE HCL 25 MG PO TABS: 25.0000 mg | ORAL_TABLET | Freq: Every day | ORAL | 1 refills | Status: DC

## 2020-01-08 MED ORDER — ALFUZOSIN HCL ER 10 MG PO TB24: 10.0000 mg | ORAL_TABLET | Freq: Every day | ORAL | 1 refills | Status: DC

## 2020-01-08 NOTE — Telephone Encounter (Signed)
Insurance will not cover social work on its own for home health.  It needs to have nursing, or PT along with it.  Please advise.

## 2020-01-08 NOTE — Patient Instructions (Addendum)
  Diabetes test better last visit. No med changes at this time. Recheck in 3 months.  If any new symptoms or concerns please return to be seen sooner. Take care!  Please contact Dr Jackolyn Confer office with [facility] to set up an appointment. Call [phone].  If you have lab work done today you will be contacted with your lab results within the next 2 weeks.  If you have not heard from [place] then please contact us. The fastest way to get your results is to register for My Chart.   IF you received an x-ray today, you will receive an invoice from [facility]. Please contact [facility] at [phone] with questions or concerns regarding your invoice.   IF you received labwork today, you will receive an invoice from [place]. Please contact [facility] at [phone] with questions or concerns regarding your invoice.   Our billing staff will not be able to assist you with questions regarding bills from these companies.  You will be contacted with the lab results as soon as they are available. The fastest way to get your results is to activate your My Chart account. Instructions are located on the last page of this paperwork. If you have not heard from [place] regarding the results in 2 weeks, please contact this office.

## 2020-01-08 NOTE — Telephone Encounter (Signed)
FYI to provider of Melissa message above.   Please advise on how you would like to proceed

## 2020-01-08 NOTE — Telephone Encounter (Signed)
Had visit with him today, wanted to see how things were going.  As they are improving we will hold off on the social work evaluation at this time.  I will send a message to [facility].

## 2020-01-08 NOTE — Progress Notes (Signed)
Subjective:  Patient ID: [name], male    DOB: 22-May-1946  Age: 74 y.o. MRN: [id number]  CC:  Chief Complaint  Patient presents with  . Diabetes    pt is here for a follow up, avg BS is 120-140    HPI [name] presents for   Diabetes: Complicated by hyperglycemia, chronic kidney disease, peripheral neuropathy.  See previous visits, some concern regarding medication adherence but was continued on Metformin 500 mg twice daily and glipizide 5 mg daily.  We had looked at possibly having social work eval but that was not covered unless PT or other modality needed.  A1c was significantly improved May 7 at 6.6.  He has reported nausea the past, potentially with using old medication.  Recommended continued use of pill minder box for dispensing of meds, not from bottles.  Did refer him to gastroenterology to discuss nausea.  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  Was evaluated for measuring hearing aids - plan for new ones in 2 weeks.  History written today.   Misplaced metformin - needs new rx.  Home readings 120-140. No symptomatic lows.  Still occasional nausea, but better than in past. None this week. No abd pain this week. Not sure about GI appt.      Lab Results  Component Value Date   HGBA1C 6.6 (A) 12/25/2019   HGBA1C 8.3 (A) 09/24/2019   HGBA1C 9.8 (A) 07/03/2019   Lab Results  Component Value Date   MICROALBUR 0.8 04/19/2016   LDLCALC 48 03/09/2019   CREATININE 1.25 (H) 09/28/2019      History Patient Active Problem List   Diagnosis Date Noted  . Iron deficiency anemia 11/04/2018  . Orthostatic hypotension 08/06/2018  . Hypertension 08/06/2018  . Hyperlipidemia 08/06/2018  . Pancytopenia ([place]) 08/06/2018  . 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 Insufficient treatment with nasal CPAP 05/05/2018  . MCI (mild cognitive impairment)  with memory loss 05/05/2018  . 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 Irregular heart beat 01/28/2018  . Noncompliance with CPAP treatment 08/14/2017  . Excessive daytime sleepiness 04/09/2017  . Snoring 04/09/2017  . Memory impairment of gradual onset 04/09/2017  . Aortic atherosclerosis ([place]) 11/27/2016  . 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 Altered mental status 03/05/2013  . Respiratory failure, post-operative ([place]) 03/02/2013  . Hepatitis B 02/24/2013  . Cancer, hepatocellular ([place]) 02/10/2013  . Liver tumor-bleeding 01/15/2013  . Epidermal cyst 06/18/2012  . OSA (obstructive sleep apnea) 03/13/2012  . Abnormal leg movement 12/12/2011  . Blood pressure elevated 09/06/2011  . Diabetes mellitus ([place]) 09/06/2011  . 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 BPH (benign prostatic hyperplasia) 09/06/2011  . Diverticula of colon 09/06/2011   Past Medical History:  Diagnosis Date  . Cancer [facility])    hepatocellular cancer   . Diabetes mellitus without complication ([place])   . Dyspnea   . Hepatitis B   . Hyperlipidemia   . Hypertension   . Irregular heart beat 01/28/2018  . Narcolepsy    per office visit note of 08/2011   . Sleep apnea    cpap   Past Surgical History:  Procedure Laterality Date  . CHOLECYSTECTOMY N/A 01/28/2018   Procedure: LAPAROSCOPIC CHOLECYSTECTOMY;  Surgeon: [name], MD;  Location: [facility];  Service: General;  Laterality: N/A;  . COLONOSCOPY WITH PROPOFOL N/A 08/27/2017   Procedure: COLONOSCOPY WITH PROPOFOL;  Surgeon: Clarene Essex,  MD;  Location: [facility];  Service: Endoscopy;  Laterality: N/A;  . HOT HEMOSTASIS N/A 08/27/2017   Procedure: HOT HEMOSTASIS (ARGON PLASMA COAGULATION/BICAP);  Surgeon: Clarene Essex, MD;  Location[facility];  Service: Endoscopy;  Laterality: N/A;  . LAPAROSCOPIC CHOLECYSTECTOMY  01/28/2018  . LAPAROSCOPY  03/02/2013   Procedure: LAPAROSCOPY DIAGNOSTIC;  Surgeon: [name], MD;  Location: [facility];  Service:  General;;  . LIVER ULTRASOUND  03/02/2013   Procedure: LIVER ULTRASOUND;  Surgeon: [name], MD;  [facility];  Service: General;;  . OPEN PARTIAL HEPATECTOMY   03/02/2013   Procedure: OPEN PARTIAL HEPATECTOMY [83];  Surgeon: [name], MD;  Location: [facility];  Service: General;;  DX LAPAROSCOPY, INTRAOPERATIVE LIVER ULTRASOUND, OPEN PARTIAL HEPATECTOMY  . pinched nerve in back     Allergies  Allergen Reactions  . Ace Inhibitors Swelling and Other (See Comments)    Angioedema - face.    Prior to Admission medications   Medication Sig Start Date End Date Taking? Authorizing Provider  ACCU-CHEK SOFTCLIX LANCETS lancets Once per day testing. 08/05/17  Yes Wendie Agreste, MD  alfuzosin (UROXATRAL) 10 MG 24 hr tablet Take 1 tablet (10 mg total) by mouth at bedtime. 05/27/19  Yes Wendie Agreste, MD  amphetamine-dextroamphetamine (ADDERALL XR) 30 MG 24 hr capsule Take daily PRN by mouth with water 30 minutes before breakfast. 06/09/19  Yes Ward Givens, NP  blood glucose meter kit and supplies Dispense based on patient and insurance preference. Use once per day. 07/17/19  Yes Wendie Agreste, MD  donepezil (ARICEPT) 10 MG tablet Take 1 tablet (10 mg total) by mouth at bedtime. 06/09/19  Yes Ward Givens, NP  feeding supplement, ENSURE ENLIVE, (ENSURE ENLIVE) LIQD Take 237 mLs by mouth 2 (two) times daily between meals. 08/08/18  Yes Emokpae, Courage, MD  ferrous sulfate (KP FERROUS SULFATE) 325 (65 FE) MG tablet Take 1 tablet (325 mg total) by mouth 3 (three) times daily with meals. 08/08/18  Yes Emokpae, Courage, MD  glipiZIDE (GLUCOTROL) 5 MG tablet Take 1 tablet (5 mg total) by mouth daily before breakfast. 11/26/19  Yes Wendie Agreste, MD  glucose blood (ACCU-CHEK AVIVA PLUS) test strip 1 each by Other route as needed for other. Use as instructed - once per day testing for now. 07/30/18  Yes Wendie Agreste, MD  glucose blood test strip Use as instructed 11/26/19  Yes  Wendie Agreste, MD  metFORMIN (GLUCOPHAGE) 500 MG tablet Take 1 tablet (500 mg total) by mouth 2 (two) times daily with a meal. 11/26/19  Yes Wendie Agreste, MD  Misc. Devices (PILL BOX 7 DAY) MISC 1 Device by Does not apply route daily. Have assistant fill pill box with daily meds. 11/05/19  Yes Wendie Agreste, MD  Multiple Vitamin (MULTIVITAMIN WITH MINERALS) TABS tablet Take 1 tablet by mouth daily. 08/09/18  Yes Emokpae, Courage, MD  pravastatin (PRAVACHOL) 20 MG tablet Take 1 tablet (20 mg total) by mouth at bedtime. 05/27/19  Yes Wendie Agreste, MD  psyllium (METAMUCIL) 58.6 % powder Take 1 packet by mouth daily. Member purchased over-the-counter 09/22/18  Yes [provider]  sertraline (ZOLOFT) 25 MG tablet Take 1 tablet (25 mg total) by mouth daily. 05/27/19  Yes Wendie Agreste, MD  spironolactone (ALDACTONE) 25 MG tablet Take 1 tablet (25 mg total) by mouth daily. 05/27/19  Yes Wendie Agreste, MD   Social History   Socioeconomic History  . Marital status: Divorced  Spouse name: Not on file  . Number of children: 2  . Years of education: 15+  . Highest education level: Bachelor's degree (e.g., BA, AB, BS)  Occupational History  . Not on file  Tobacco Use  . Smoking status: Former Smoker    Years: 5.00    Types: Cigarettes  . Smokeless tobacco: Never Used  Substance and Sexual Activity  . Alcohol use: No  . Drug use: No  . Sexual activity: Not Currently  Other Topics Concern  . Not on file  Social History Narrative   Patient is single and lives alone- became widower when 1 child was 59 year old, divorced 2nd marriage   Has #2 grown children, not in area or involved in his life   Patient is retired.   Patient has a college education.   Patient is right-handed.   Patient drinks maybe two sodas daily.   Social Determinants of Health   Financial Resource Strain:   . Difficulty of Paying Living Expenses:   Food Insecurity:   . Worried About Paediatric nurse in the Last Year:   . Arboriculturist in the Last Year:   Transportation Needs:   . Film/video editor (Medical):   [name] Kitchen Lack of Transportation (Non-Medical):   Physical Activity:   . Days of Exercise per Week:   . Minutes of Exercise per Session:   Stress:   . Feeling of Stress :   Social Connections:   . Frequency of Communication with Friends and Family:   . Frequency of Social Gatherings with Friends and Family:   . Attends Religious Services:   . Active Member of Clubs or Organizations:   . Attends Archivist Meetings:   [name] Kitchen Marital Status:   Intimate Partner Violence:   . Fear of Current or Ex-Partner:   . Emotionally Abused:   [name] Kitchen Physically Abused:   . Sexually Abused:     Review of Systems Per HPI  Objective:   Vitals:   01/08/20 1049  BP: (!) 145/75  Pulse: 64  Resp: 15  Temp: 97.8 F (36.6 C)  TempSrc: Temporal  SpO2: 96%  Weight: 195 lb 12.8 oz (88.8 kg)  Height: '5\' 9"'  (1.753 m)     Physical Exam Vitals reviewed.  Constitutional:      Appearance: He is well-developed.  HENT:     Head: Normocephalic and atraumatic.  Eyes:     Pupils: Pupils are equal, round, and reactive to light.  Neck:     Vascular: No carotid bruit or JVD.  Cardiovascular:     Rate and Rhythm: Normal rate and regular rhythm.     Heart sounds: Normal heart sounds. No murmur.  Pulmonary:     Effort: Pulmonary effort is normal.     Breath sounds: Normal breath sounds. No rales.  Skin:    General: Skin is warm and dry.  Neurological:     Mental Status: He is alert and oriented to person, place, and time.     Assessment & Plan:  [name] is a 74 y.o. male . Hearing difficulty, unspecified laterality  Type 2 diabetes mellitus with hyperglycemia, without long-term current use of insulin (HCC) - Plan: glipiZIDE (GLUCOTROL) 5 MG tablet, metFORMIN (GLUCOPHAGE) 500 MG tablet  Benign prostatic hyperplasia, unspecified whether lower urinary tract  symptoms present - Plan: alfuzosin (UROXATRAL) 10 MG 24 hr tablet  Hyperlipidemia, unspecified hyperlipidemia type - Plan: pravastatin (PRAVACHOL) 20 MG tablet  Depression, unspecified  depression type - Plan: sertraline (ZOLOFT) 25 MG tablet  Essential hypertension - Plan: spironolactone (ALDACTONE) 25 MG tablet  Reports stable symptoms, improved nausea. Still recommend GI follow up - will check into appt.  A1C improved last visit - no hypoglycemia. continue same regimen for now with repeat testing in 3 months.   Has hearing aids pending in few weeks. Understanding expressed with written hx and plan.   Contact information provided for gastroenterology as they had tried to schedule appointment.  No orders of the defined types were placed in this encounter.  Patient Instructions       If you have lab work done today you will be contacted with your lab results within the next 2 weeks.  If you have not heard from [place] then please contact us. The fastest way to get your results is to register for My Chart.   IF you received an x-ray today, you will receive an invoice from [facility]. Please contact [facility] at [phone] with questions or concerns regarding your invoice.   IF you received labwork today, you will receive an invoice from [facility]. Please contact [facility] at [phone] with questions or concerns regarding your invoice.   Our billing staff will not be able to assist you with questions regarding bills from these companies.  You will be contacted with the lab results as soon as they are available. The fastest way to get your results is to activate your My Chart account. Instructions are located on the last page of this paperwork. If you have not heard from [place] regarding the results in 2 weeks, please contact this office.         Signed, [name], MD [facility]

## 2020-01-12 ENCOUNTER — Telehealth: Payer: Self-pay | Admitting: Adult Health

## 2020-01-12 NOTE — Telephone Encounter (Signed)
I called the # and extension provided below for [facility] and I LVM for [facility]. Her vm greeting stated it was a confidential VM. I provided pt's name & DOB and advised we do not have any samples of Adderall or any controlled substance here at the office. I left office number for call back if needed.

## 2020-01-12 NOTE — Telephone Encounter (Signed)
amphetamine-dextroamphetamine (ADDERALL XR) 30 MG 24 hr capsule  Rep with [employer] called to inform Pt has been out for about a week. And they are currently helping pt with financial resources to be able to obtain his medication but wanted to check in if office had any samples of adderall available for pt

## 2020-01-25 ENCOUNTER — Telehealth: Payer: Self-pay | Admitting: Family Medicine

## 2020-01-25 NOTE — Telephone Encounter (Signed)
[name], from [facility], called stating that the PA was denied for the pts Adderall. She states that the pt is requesting to have this decision appealed. Please advise .    Appeals [address]. [id number] [place] [address]  [phone]

## 2020-01-25 NOTE — Telephone Encounter (Signed)
Pt adderall was denied pt wants this appealed please let pt know if this is not possible or I can thank you

## 2020-01-29 ENCOUNTER — Telehealth: Payer: Self-pay

## 2020-01-29 ENCOUNTER — Other Ambulatory Visit: Payer: Self-pay

## 2020-01-29 ENCOUNTER — Inpatient Hospital Stay: Payer: Medicare Other

## 2020-01-29 ENCOUNTER — Inpatient Hospital Stay: Payer: Medicare Other | Attending: Oncology | Admitting: Oncology

## 2020-01-29 VITALS — BP 142/72 | HR 79 | Temp 97.9°F | Resp 20 | Ht 69.0 in | Wt 193.6 lb

## 2020-01-29 DIAGNOSIS — K5730 Diverticulosis of large intestine without perforation or abscess without bleeding: Secondary | ICD-10-CM | POA: Diagnosis not present

## 2020-01-29 DIAGNOSIS — R5381 Other malaise: Secondary | ICD-10-CM | POA: Diagnosis not present

## 2020-01-29 DIAGNOSIS — D500 Iron deficiency anemia secondary to blood loss (chronic): Secondary | ICD-10-CM

## 2020-01-29 DIAGNOSIS — K5520 Angiodysplasia of colon without hemorrhage: Secondary | ICD-10-CM | POA: Diagnosis not present

## 2020-01-29 DIAGNOSIS — R911 Solitary pulmonary nodule: Secondary | ICD-10-CM | POA: Diagnosis not present

## 2020-01-29 DIAGNOSIS — K648 Other hemorrhoids: Secondary | ICD-10-CM | POA: Insufficient documentation

## 2020-01-29 DIAGNOSIS — R161 Splenomegaly, not elsewhere classified: Secondary | ICD-10-CM | POA: Diagnosis not present

## 2020-01-29 DIAGNOSIS — D123 Benign neoplasm of transverse colon: Secondary | ICD-10-CM | POA: Insufficient documentation

## 2020-01-29 DIAGNOSIS — I8510 Secondary esophageal varices without bleeding: Secondary | ICD-10-CM | POA: Insufficient documentation

## 2020-01-29 DIAGNOSIS — Z79899 Other long term (current) drug therapy: Secondary | ICD-10-CM | POA: Insufficient documentation

## 2020-01-29 DIAGNOSIS — K644 Residual hemorrhoidal skin tags: Secondary | ICD-10-CM | POA: Diagnosis not present

## 2020-01-29 DIAGNOSIS — D124 Benign neoplasm of descending colon: Secondary | ICD-10-CM | POA: Diagnosis not present

## 2020-01-29 DIAGNOSIS — C220 Liver cell carcinoma: Secondary | ICD-10-CM

## 2020-01-29 DIAGNOSIS — D1779 Benign lipomatous neoplasm of other sites: Secondary | ICD-10-CM | POA: Diagnosis not present

## 2020-01-29 DIAGNOSIS — K621 Rectal polyp: Secondary | ICD-10-CM | POA: Diagnosis not present

## 2020-01-29 DIAGNOSIS — R0609 Other forms of dyspnea: Secondary | ICD-10-CM | POA: Insufficient documentation

## 2020-01-29 DIAGNOSIS — E119 Type 2 diabetes mellitus without complications: Secondary | ICD-10-CM | POA: Diagnosis not present

## 2020-01-29 DIAGNOSIS — D61818 Other pancytopenia: Secondary | ICD-10-CM | POA: Insufficient documentation

## 2020-01-29 DIAGNOSIS — D122 Benign neoplasm of ascending colon: Secondary | ICD-10-CM | POA: Diagnosis not present

## 2020-01-29 DIAGNOSIS — K7460 Unspecified cirrhosis of liver: Secondary | ICD-10-CM | POA: Insufficient documentation

## 2020-01-29 LAB — CBC WITH DIFFERENTIAL (CANCER CENTER ONLY)
Abs Immature Granulocytes: 0.02 10*3/uL (ref 0.00–0.07)
Basophils Absolute: 0 10*3/uL (ref 0.0–0.1)
Basophils Relative: 1 %
Eosinophils Absolute: 0 10*3/uL (ref 0.0–0.5)
Eosinophils Relative: 1 %
HCT: 28.6 % — ABNORMAL LOW (ref 39.0–52.0)
Hemoglobin: 8.9 g/dL — ABNORMAL LOW (ref 13.0–17.0)
Immature Granulocytes: 1 %
Lymphocytes Relative: 18 %
Lymphs Abs: 0.6 10*3/uL — ABNORMAL LOW (ref 0.7–4.0)
MCH: 28.6 pg (ref 26.0–34.0)
MCHC: 31.1 g/dL (ref 30.0–36.0)
MCV: 92 fL (ref 80.0–100.0)
Monocytes Absolute: 0.2 10*3/uL (ref 0.1–1.0)
Monocytes Relative: 7 %
Neutro Abs: 2.2 10*3/uL (ref 1.7–7.7)
Neutrophils Relative %: 72 %
Platelet Count: 70 10*3/uL — ABNORMAL LOW (ref 150–400)
RBC: 3.11 MIL/uL — ABNORMAL LOW (ref 4.22–5.81)
RDW: 16.5 % — ABNORMAL HIGH (ref 11.5–15.5)
WBC Count: 3 10*3/uL — ABNORMAL LOW (ref 4.0–10.5)
nRBC: 0 % (ref 0.0–0.2)

## 2020-01-29 LAB — FERRITIN: Ferritin: 37 ng/mL (ref 24–336)

## 2020-01-29 NOTE — Telephone Encounter (Signed)
TC to pt per Ned Card NP to let him know that his iron is in low normal range. Let him know that I would be sending a schedule message to schedule IV iron for next visit. Patient verbalized understanding. Message sent.

## 2020-01-29 NOTE — Progress Notes (Signed)
[name] OFFICE PROGRESS NOTE    Diagnosis: Hepatocellular carcinoma  INTERVAL HISTORY:   [name] returns for a scheduled visit.  He reports malaise.  He was able to express material from a lesion at the right upper back.  He notes intermittent "dark "stool.  No gross blood.  He is taking iron.  He has received the COVID-19 vaccine.  Objective:  Vital signs in last 24 hours:  Blood pressure (!) 142/72, pulse 79, temperature 97.9 F (36.6 C), temperature source Temporal, resp. rate 20, height 5\' 9"  (1.753 m), weight 193 lb 9.6 oz (87.8 kg), SpO2 100 %.    Lymphatics: No cervical, supraclavicular, or axillary nodes Resp: Scattered end inspiratory coarse rhonchi, no respiratory distress Cardio: Regular rate and rhythm GI: No hepatosplenomegaly, nontender, no apparent ascites Vascular: No leg edema   Lab Results:  Lab Results  Component Value Date   WBC 3.0 (L) 01/29/2020   HGB 8.9 (L) 01/29/2020   HCT 28.6 (L) 01/29/2020   MCV 92.0 01/29/2020   PLT 70 (L) 01/29/2020   NEUTROABS 2.2 01/29/2020    CMP  Lab Results  Component Value Date   NA 140 09/28/2019   K 3.8 09/28/2019   CL 108 09/28/2019   CO2 24 09/28/2019   GLUCOSE 229 (H) 09/28/2019   BUN 8 09/28/2019   CREATININE 1.25 (H) 09/28/2019   CALCIUM 8.7 (L) 09/28/2019   PROT 7.5 09/28/2019   ALBUMIN 3.4 (L) 09/28/2019   AST 27 09/28/2019   ALT 23 09/28/2019   ALKPHOS 117 09/28/2019   BILITOT 1.3 (H) 09/28/2019   GFRNONAA 56 (L) 09/28/2019   GFRAA >60 09/28/2019     Medications: I have reviewed the patient's current medications.   Assessment/Plan:  1. Hepatocellular carcinoma, stage I (T1 NX) status post partial left hepatectomy 03/02/2013.  08/31/2013 AFP 2.4.   CT abdomen 08/31/2013 with postoperative changes of partial hepatectomy with small postoperative fluid collection along the resection margin. No definite signs to suggest residual or locally recurrent disease.  Several borderline enlarged and minimally enlarged lymph nodes superior to the liver in the juxtapericardiac fat of the lower anterior mediastinum with the largest measuring 11 mm slightly increased compared to the prior study 01/15/2013. Several prominent non-pathologically enlarged upper abdominal ligament lymph nodes similar to the prior study. Small esophageal varices.   CT abdomen 03/03/2012 without evidence of recurrent hepatocellular carcinoma, stable right lung base nodule  CT the abdomen and pelvis 09/06/2014 without evidence of recurrent hepatocellular carcinoma, slight enlargement of pre-cardiac lymph nodes  CT abdomen/pelvis 09/07/2015 with no findings for recurrent tumor or metastatic disease. Stable small 3 mm right middle lobe pulmonary nodule since 2014. Epicardial lymph node measured 11 mm, previously 13 mm. Stable cirrhotic changes. Fairly significant esophageal varices.  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 Cirrhosis. 3. Hepatitis B core antibody positive. 4. Diabetes. 5. Severe microcytic anemia-likely iron deficiency anemia-progressive 06/03/2017. Stool Hemoccult positive 3 06/05/2017.  6. Colonoscopy 12/27/2016-external and internal hemorrhoids. Diverticulosis in the sigmoid, descending and transverse colon. One medium polyp in the distal descending colon. 3 small polyps in the transverse colon. 2 diminutive polyps in the  rectum and the proximal  ascending colon. Medium-sized lipoma transverse colon. Small lipoma mid ascending colon. Multiplenonbleeding colonic angiodysplastic lesions. Pathology-tubular adenomas, hyperplastic polyps. 7. Pancytopenia secondary to cirrhosis and GI bleeding 8. Laparoscopic cholecystectomy 01/28/2018 9. Decreased iron stores 10/15/2018; transfused 2 units of blood 10/18/2018; Feraheme 11/04/2018 and 11/11/2018   Disposition: [name] is in clinical remission from the hepatocellular carcinoma.  We will follow up on the AFP from today.  He last had a CT evaluation in February of this year.  We will consider another CT at a 1 year interval.  He has progressive anemia.  Anemia is most likely related to GI bleeding in the setting of cirrhosis and varices.  We will follow up on the ferritin level from today and administer IV iron as needed.  He will return for an office visit and CBC in 3 weeks.  He will call for symptoms of anemia.  He is scheduled to see Dr. Watt Climes next week.  Dr. Watt Climes may consider an endoscopic evaluation of the varices.  Betsy Coder, MD  01/29/2020  9:44 AM

## 2020-01-30 LAB — AFP TUMOR MARKER: AFP, Serum, Tumor Marker: 2.1 ng/mL (ref 0.0–8.3)

## 2020-02-03 ENCOUNTER — Other Ambulatory Visit: Payer: Self-pay | Admitting: Family Medicine

## 2020-02-03 DIAGNOSIS — G47411 Narcolepsy with cataplexy: Secondary | ICD-10-CM

## 2020-02-03 NOTE — Telephone Encounter (Signed)
Medication Refill - Medication: adderall   Has the patient contacted their pharmacy? Yes.   (Agent: If no, request that the patient contact the pharmacy for the refill.) (Agent: If yes, when and what did the pharmacy advise?)  Preferred Pharmacy (with phone number or street name):  [place] ([place]), [place] - [address]  [address]  ([place]) [place] [address]  Phone: [phone] Fax: [phone]  Hours: Not open 24 hours     Agent: Please be advised that RX refills may take up to 3 business days. We ask that you follow-up with your pharmacy.

## 2020-02-03 NOTE — Telephone Encounter (Deleted)
Called patient using Interpreter [phone], unable to reach patient left message in mobile voice mail. I need to know if it is insulin needles or lancets you need refill.

## 2020-02-03 NOTE — Telephone Encounter (Signed)
Pt has not gotten adderall from [place]. According to the note he has not had it since 06/09/19   Patient is requesting a refill of the following medications: Requested Prescriptions   Refused Prescriptions Disp Refills  . amphetamine-dextroamphetamine (ADDERALL XR) 30 MG 24 hr capsule 30 capsule 0    Sig: Take daily PRN by mouth with water 30 minutes before breakfast.    Date of patient request: 02/03/20 Last office visit: 01/08/20 Date of last refill: 06/09/19 Last refill amount: 30 Follow up time period per chart: 04/14/20  Please advise on the medication. Refill or Refuse. I am not able to do either.

## 2020-02-03 NOTE — Telephone Encounter (Signed)
This is prescribed by neurology, notated on refused prescription.

## 2020-02-03 NOTE — Telephone Encounter (Signed)
Previous message error and deleted.

## 2020-02-03 NOTE — Telephone Encounter (Signed)
Requested medication (s) are due for refill today: yes  Requested medication (s) are on the active medication list: yes  Last refill:  ?  Future visit scheduled: yes  Notes to clinic:  not delegated; original script per Ward Givens, NP, neurology    Requested Prescriptions  Pending Prescriptions Disp Refills   amphetamine-dextroamphetamine (ADDERALL XR) 30 MG 24 hr capsule 30 capsule 0    Sig: Take daily PRN by mouth with water 30 minutes before breakfast.      Not Delegated - Psychiatry:  Stimulants/ADHD Failed - 02/03/2020 12:02 PM      Failed - This refill cannot be delegated      Failed - Urine Drug Screen completed in last 360 days.      Passed - Valid encounter within last 3 months    Recent Outpatient Visits           3 weeks ago Hearing difficulty, unspecified laterality   [facility], Ranell Patrick, MD   1 month ago Type 2 diabetes mellitus with hyperglycemia, without long-term current use of insulin [facility])   [facility], Ranell Patrick, MD   1 month ago Type 2 diabetes mellitus with hyperglycemia, without long-term current use of insulin [facility])   [facility], Ranell Patrick, MD   2 months ago Type 2 diabetes mellitus with hyperglycemia, without long-term current use of insulin [facility])   [facility], Ranell Patrick, MD   3 months ago Type 2 diabetes mellitus with hyperglycemia, without long-term current use of insulin [facility])   [facility], Ranell Patrick, MD       Future Appointments             In 2 months Carlota Raspberry Ranell Patrick, MD [facility] [place], [facility]

## 2020-02-10 ENCOUNTER — Other Ambulatory Visit: Payer: Self-pay | Admitting: Gastroenterology

## 2020-02-10 ENCOUNTER — Other Ambulatory Visit: Payer: Self-pay | Admitting: Adult Health

## 2020-02-10 DIAGNOSIS — G47411 Narcolepsy with cataplexy: Secondary | ICD-10-CM

## 2020-02-10 DIAGNOSIS — R1011 Right upper quadrant pain: Secondary | ICD-10-CM | POA: Diagnosis not present

## 2020-02-10 DIAGNOSIS — D508 Other iron deficiency anemias: Secondary | ICD-10-CM | POA: Diagnosis not present

## 2020-02-10 MED ORDER — AMPHETAMINE-DEXTROAMPHET ER 30 MG PO CP24: ORAL_CAPSULE | ORAL | 0 refills | Status: DC

## 2020-02-10 NOTE — Telephone Encounter (Signed)
Bernita Buffy from [employer] [phone]) has called asking for a refill for pt's amphetamine-dextroamphetamine (ADDERALL XR) 30 MG 24 hr capsule to [facility]

## 2020-02-10 NOTE — Addendum Note (Signed)
Addended by: Brandon Melnick on: 02/10/2020 11:43 AM   Modules accepted: Orders

## 2020-02-15 ENCOUNTER — Inpatient Hospital Stay: Payer: Medicare Other

## 2020-02-15 ENCOUNTER — Inpatient Hospital Stay (HOSPITAL_BASED_OUTPATIENT_CLINIC_OR_DEPARTMENT_OTHER): Payer: Medicare Other | Admitting: Nurse Practitioner

## 2020-02-15 ENCOUNTER — Other Ambulatory Visit: Payer: Self-pay | Admitting: Nurse Practitioner

## 2020-02-15 ENCOUNTER — Other Ambulatory Visit: Payer: Self-pay

## 2020-02-15 ENCOUNTER — Encounter: Payer: Self-pay | Admitting: Nurse Practitioner

## 2020-02-15 VITALS — BP 111/93 | HR 84 | Temp 97.5°F | Resp 18 | Ht 69.0 in | Wt 190.4 lb

## 2020-02-15 VITALS — BP 130/74 | HR 75 | Temp 98.0°F | Resp 18

## 2020-02-15 DIAGNOSIS — E119 Type 2 diabetes mellitus without complications: Secondary | ICD-10-CM | POA: Diagnosis not present

## 2020-02-15 DIAGNOSIS — D124 Benign neoplasm of descending colon: Secondary | ICD-10-CM | POA: Diagnosis not present

## 2020-02-15 DIAGNOSIS — R5381 Other malaise: Secondary | ICD-10-CM | POA: Diagnosis not present

## 2020-02-15 DIAGNOSIS — C220 Liver cell carcinoma: Secondary | ICD-10-CM

## 2020-02-15 DIAGNOSIS — D123 Benign neoplasm of transverse colon: Secondary | ICD-10-CM | POA: Diagnosis not present

## 2020-02-15 DIAGNOSIS — R911 Solitary pulmonary nodule: Secondary | ICD-10-CM | POA: Diagnosis not present

## 2020-02-15 DIAGNOSIS — D1779 Benign lipomatous neoplasm of other sites: Secondary | ICD-10-CM | POA: Diagnosis not present

## 2020-02-15 DIAGNOSIS — K648 Other hemorrhoids: Secondary | ICD-10-CM | POA: Diagnosis not present

## 2020-02-15 DIAGNOSIS — I8510 Secondary esophageal varices without bleeding: Secondary | ICD-10-CM | POA: Diagnosis not present

## 2020-02-15 DIAGNOSIS — K621 Rectal polyp: Secondary | ICD-10-CM | POA: Diagnosis not present

## 2020-02-15 DIAGNOSIS — K7460 Unspecified cirrhosis of liver: Secondary | ICD-10-CM | POA: Diagnosis not present

## 2020-02-15 DIAGNOSIS — K5520 Angiodysplasia of colon without hemorrhage: Secondary | ICD-10-CM | POA: Diagnosis not present

## 2020-02-15 DIAGNOSIS — D500 Iron deficiency anemia secondary to blood loss (chronic): Secondary | ICD-10-CM

## 2020-02-15 DIAGNOSIS — D122 Benign neoplasm of ascending colon: Secondary | ICD-10-CM | POA: Diagnosis not present

## 2020-02-15 DIAGNOSIS — K5730 Diverticulosis of large intestine without perforation or abscess without bleeding: Secondary | ICD-10-CM | POA: Diagnosis not present

## 2020-02-15 DIAGNOSIS — R161 Splenomegaly, not elsewhere classified: Secondary | ICD-10-CM | POA: Diagnosis not present

## 2020-02-15 DIAGNOSIS — D61818 Other pancytopenia: Secondary | ICD-10-CM | POA: Diagnosis not present

## 2020-02-15 DIAGNOSIS — K644 Residual hemorrhoidal skin tags: Secondary | ICD-10-CM | POA: Diagnosis not present

## 2020-02-15 DIAGNOSIS — Z79899 Other long term (current) drug therapy: Secondary | ICD-10-CM | POA: Diagnosis not present

## 2020-02-15 DIAGNOSIS — R0609 Other forms of dyspnea: Secondary | ICD-10-CM | POA: Diagnosis not present

## 2020-02-15 LAB — SAMPLE TO BLOOD BANK

## 2020-02-15 LAB — CBC WITH DIFFERENTIAL (CANCER CENTER ONLY)
Abs Immature Granulocytes: 0.01 10*3/uL (ref 0.00–0.07)
Basophils Absolute: 0 10*3/uL (ref 0.0–0.1)
Basophils Relative: 1 %
Eosinophils Absolute: 0 10*3/uL (ref 0.0–0.5)
Eosinophils Relative: 1 %
HCT: 28.9 % — ABNORMAL LOW (ref 39.0–52.0)
Hemoglobin: 9.2 g/dL — ABNORMAL LOW (ref 13.0–17.0)
Immature Granulocytes: 0 %
Lymphocytes Relative: 19 %
Lymphs Abs: 0.6 10*3/uL — ABNORMAL LOW (ref 0.7–4.0)
MCH: 29.4 pg (ref 26.0–34.0)
MCHC: 31.8 g/dL (ref 30.0–36.0)
MCV: 92.3 fL (ref 80.0–100.0)
Monocytes Absolute: 0.3 10*3/uL (ref 0.1–1.0)
Monocytes Relative: 11 %
Neutro Abs: 2.1 10*3/uL (ref 1.7–7.7)
Neutrophils Relative %: 68 %
Platelet Count: 72 10*3/uL — ABNORMAL LOW (ref 150–400)
RBC: 3.13 MIL/uL — ABNORMAL LOW (ref 4.22–5.81)
RDW: 16.3 % — ABNORMAL HIGH (ref 11.5–15.5)
WBC Count: 3 10*3/uL — ABNORMAL LOW (ref 4.0–10.5)
nRBC: 0 % (ref 0.0–0.2)

## 2020-02-15 LAB — BASIC METABOLIC PANEL - CANCER CENTER ONLY
Anion gap: 11 (ref 5–15)
BUN: 10 mg/dL (ref 8–23)
CO2: 19 mmol/L — ABNORMAL LOW (ref 22–32)
Calcium: 8.5 mg/dL — ABNORMAL LOW (ref 8.9–10.3)
Chloride: 110 mmol/L (ref 98–111)
Creatinine: 1.35 mg/dL — ABNORMAL HIGH (ref 0.61–1.24)
GFR, Est AFR Am: 59 mL/min — ABNORMAL LOW (ref >60)
GFR, Estimated: 51 mL/min — ABNORMAL LOW (ref >60)
Glucose, Bld: 257 mg/dL — ABNORMAL HIGH (ref 70–99)
Potassium: 4.1 mmol/L (ref 3.5–5.1)
Sodium: 140 mmol/L (ref 135–145)

## 2020-02-15 MED ORDER — SODIUM CHLORIDE 0.9 % IV SOLN
510.0000 mg | Freq: Once | INTRAVENOUS | Status: AC
  Administered 2020-02-15: 510 mg via INTRAVENOUS
  Filled 2020-02-15: qty 510

## 2020-02-15 MED ORDER — SODIUM CHLORIDE 0.9 % IV SOLN
INTRAVENOUS | Status: DC
  Filled 2020-02-15: qty 250

## 2020-02-15 NOTE — Patient Instructions (Signed)

## 2020-02-15 NOTE — Progress Notes (Signed)
[name] OFFICE PROGRESS NOTE   Diagnosis: Hepatocellular carcinoma  INTERVAL HISTORY:   [name] returns as scheduled.  He continues oral iron 3 times a day.  He notes stools are periodically black.  He has dyspnea on exertion.  Upper endoscopy is scheduled 02/23/2020.  Objective:  Vital signs in last 24 hours:  Blood pressure (!) 111/93, pulse 84, temperature (!) 97.5 F (36.4 C), temperature source Temporal, resp. rate 18, height 5\' 9"  (1.753 m), weight 190 lb 6.4 oz (86.4 kg), SpO2 100 %.   Resp: Lungs clear bilaterally. Cardio: Regular rate and rhythm. GI: Abdomen soft and nontender.  No hepatomegaly.  No mass. Vascular: No leg edema.   Lab Results:  Lab Results  Component Value Date   WBC 3.0 (L) 02/15/2020   HGB 9.2 (L) 02/15/2020   HCT 28.9 (L) 02/15/2020   MCV 92.3 02/15/2020   PLT 72 (L) 02/15/2020   NEUTROABS 2.1 02/15/2020    Imaging:  No results found.  Medications: I have reviewed the patient's current medications.  Assessment/Plan: 1. Hepatocellular carcinoma, stage I (T1 NX) status post partial left hepatectomy 03/02/2013.  08/31/2013 AFP 2.4.   CT abdomen 08/31/2013 with postoperative changes of partial hepatectomy with small postoperative fluid collection along the resection margin. No definite signs to suggest residual or locally recurrent disease. Several borderline enlarged and minimally enlarged lymph nodes superior to the liver in the juxtapericardiac fat of the lower anterior mediastinum with the largest measuring 11 mm slightly increased compared to the prior study 01/15/2013. Several prominent non-pathologically enlarged upper abdominal ligament lymph nodes similar to the prior study. Small esophageal varices.   CT abdomen 03/03/2012 without evidence of recurrent hepatocellular carcinoma, stable right lung base nodule  CT the abdomen and pelvis 09/06/2014 without evidence of recurrent hepatocellular carcinoma, slight  enlargement of pre-cardiac lymph nodes  CT abdomen/pelvis 09/07/2015 with no findings for recurrent tumor or metastatic disease. Stable small 3 mm right middle lobe pulmonary nodule since 2014. Epicardial lymph node measured 11 mm, previously 13 mm. Stable cirrhotic changes. Fairly significant esophageal varices.  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 Cirrhosis. 3. Hepatitis B core antibody positive. 4. Diabetes. 5. Severe microcytic anemia-likely iron deficiency anemia-progressive 06/03/2017. Stool Hemoccult positive 3 06/05/2017.  6. Colonoscopy 12/27/2016-external and internal hemorrhoids. Diverticulosis in the sigmoid, descending and transverse colon. One medium polyp in the distal descending colon. 3 small polyps in the transverse colon. 2 diminutive polyps in the rectum and the proximal ascending colon. Medium-sized lipoma transverse colon. Small lipoma mid ascending colon. Multiplenonbleeding colonic angiodysplastic lesions. Pathology-tubular adenomas, hyperplastic polyps. 7. Pancytopenia secondary to cirrhosis and GI bleeding 8. Laparoscopic cholecystectomy 01/28/2018 9. Decreased iron stores 10/15/2018; transfused 2 units of blood 10/18/2018; Feraheme 11/04/2018 and 11/11/2018; Feraheme 02/18/2019 and 02/25/2019    Disposition: [name] appears unchanged.  He remains in clinical remission from hepatocellular carcinoma.  AFP from 01/29/2020 was stable in normal range.   Consider CT evaluation 1 year from the previous.  We reviewed the CBC from today.  Hemoglobin is stable.  Ferritin from 01/29/2020 was decreased.  He is scheduled for IV iron today.  He will  return for a second dose of IV iron in 2 weeks.  He is scheduled for an upper endoscopy 02/23/2020.  He will return for lab and follow-up in 6 weeks.  Plan reviewed with Dr. Benay Spice.    Ned Card ANP/GNP-BC   02/15/2020  9:08 AM

## 2020-02-19 NOTE — Progress Notes (Signed)
Pre call made, pt informed to have covid test done by 02-20-2020 at [facility], pt states he wont be able to and doesn't have a ride, informed procedure will be cancelled if covid test not done, also states he doesn't have transport home from hospital post procedure, per charge rn, Santiago Glad, pt cannot take cab home by self post procedure,  Informed to be at hospital around 12:15pm for procedure start time at 13:45, pt instructed to call Dr. Perley Jain office, phone number given, Santiago Glad, charge RN aware of pt's situation

## 2020-02-23 ENCOUNTER — Ambulatory Visit (HOSPITAL_COMMUNITY)
Admission: RE | Admit: 2020-02-23 | Discharge: 2020-02-23 | Disposition: A | Discharge status: Home / Self Care | Payer: Medicare Other | Attending: Gastroenterology | Admitting: Gastroenterology

## 2020-02-23 ENCOUNTER — Other Ambulatory Visit (HOSPITAL_COMMUNITY)
Admission: RE | Admit: 2020-02-23 | Discharge: 2020-02-23 | Disposition: A | Discharge status: Home / Self Care | Payer: Medicare Other | Source: Ambulatory Visit | Attending: Gastroenterology | Admitting: Gastroenterology

## 2020-02-23 ENCOUNTER — Other Ambulatory Visit: Payer: Self-pay

## 2020-02-23 ENCOUNTER — Ambulatory Visit (HOSPITAL_COMMUNITY): Payer: Medicare Other | Admitting: Certified Registered"

## 2020-02-23 ENCOUNTER — Encounter (HOSPITAL_COMMUNITY)
Admission: RE | Disposition: A | Discharge status: Home / Self Care | Payer: Self-pay | Source: Home / Self Care | Attending: Gastroenterology

## 2020-02-23 ENCOUNTER — Encounter (HOSPITAL_COMMUNITY): Payer: Self-pay | Admitting: Gastroenterology

## 2020-02-23 DIAGNOSIS — B1910 Unspecified viral hepatitis B without hepatic coma: Secondary | ICD-10-CM | POA: Insufficient documentation

## 2020-02-23 DIAGNOSIS — K3189 Other diseases of stomach and duodenum: Secondary | ICD-10-CM | POA: Diagnosis not present

## 2020-02-23 DIAGNOSIS — E119 Type 2 diabetes mellitus without complications: Secondary | ICD-10-CM | POA: Insufficient documentation

## 2020-02-23 DIAGNOSIS — K766 Portal hypertension: Secondary | ICD-10-CM | POA: Diagnosis not present

## 2020-02-23 DIAGNOSIS — I8500 Esophageal varices without bleeding: Secondary | ICD-10-CM | POA: Diagnosis not present

## 2020-02-23 DIAGNOSIS — R933 Abnormal findings on diagnostic imaging of other parts of digestive tract: Secondary | ICD-10-CM | POA: Diagnosis not present

## 2020-02-23 DIAGNOSIS — Z20822 Contact with and (suspected) exposure to covid-19: Secondary | ICD-10-CM | POA: Diagnosis not present

## 2020-02-23 DIAGNOSIS — Z87891 Personal history of nicotine dependence: Secondary | ICD-10-CM | POA: Diagnosis not present

## 2020-02-23 DIAGNOSIS — Z7984 Long term (current) use of oral hypoglycemic drugs: Secondary | ICD-10-CM | POA: Diagnosis not present

## 2020-02-23 DIAGNOSIS — I10 Essential (primary) hypertension: Secondary | ICD-10-CM | POA: Diagnosis not present

## 2020-02-23 DIAGNOSIS — D509 Iron deficiency anemia, unspecified: Secondary | ICD-10-CM | POA: Diagnosis not present

## 2020-02-23 DIAGNOSIS — K7460 Unspecified cirrhosis of liver: Secondary | ICD-10-CM | POA: Diagnosis not present

## 2020-02-23 DIAGNOSIS — Z79899 Other long term (current) drug therapy: Secondary | ICD-10-CM | POA: Diagnosis not present

## 2020-02-23 DIAGNOSIS — R1011 Right upper quadrant pain: Secondary | ICD-10-CM | POA: Insufficient documentation

## 2020-02-23 HISTORY — PX: ESOPHAGOGASTRODUODENOSCOPY (EGD) WITH PROPOFOL: SHX5813

## 2020-02-23 LAB — GLUCOSE, CAPILLARY: Glucose-Capillary: 164 mg/dL — ABNORMAL HIGH (ref 70–99)

## 2020-02-23 LAB — SARS CORONAVIRUS 2 BY RT PCR (HOSPITAL ORDER, PERFORMED IN ~~LOC~~ HOSPITAL LAB): SARS Coronavirus 2: NEGATIVE

## 2020-02-23 SURGERY — ESOPHAGOGASTRODUODENOSCOPY (EGD) WITH PROPOFOL
Anesthesia: Monitor Anesthesia Care

## 2020-02-23 MED ORDER — PROPOFOL 500 MG/50ML IV EMUL
INTRAVENOUS | Status: DC | PRN
  Administered 2020-02-23: 100 ug/kg/min via INTRAVENOUS

## 2020-02-23 MED ORDER — SODIUM CHLORIDE 0.9 % IV SOLN: INTRAVENOUS | Status: DC

## 2020-02-23 MED ORDER — LACTATED RINGERS IV SOLN: INTRAVENOUS | Status: DC | PRN

## 2020-02-23 SURGICAL SUPPLY — 14 items

## 2020-02-23 NOTE — Discharge Instructions (Signed)
May have soft solids first meal today and slowly advance diet and call me if any GI question or problem and follow-up as needed  YOU HAD AN ENDOSCOPIC PROCEDURE TODAY: Refer to the procedure report and other information in the discharge instructions given to you for any specific questions about what was found during the examination. If this information does not answer your questions, please call [facility] office at [phone] to clarify.   YOU SHOULD EXPECT: Some feelings of bloating in the abdomen. Passage of more gas than usual. Walking can help get rid of the air that was put into your GI tract during the procedure and reduce the bloating. If you had a lower endoscopy (such as a colonoscopy or flexible sigmoidoscopy) you may notice spotting of blood in your stool or on the toilet paper. Some abdominal soreness may be present for a day or two, also.  DIET: Your first meal following the procedure should be a light meal and then it is ok to progress to your normal diet. A half-sandwich or bowl of soup is an example of a good first meal. Heavy or fried foods are harder to digest and may make you feel nauseous or bloated. Drink plenty of fluids but you should avoid alcoholic beverages for 24 hours. If you had a esophageal dilation, please see attached instructions for diet.    ACTIVITY: Your care partner should take you home directly after the procedure. You should plan to take it easy, moving slowly for the rest of the day. You can resume normal activity the day after the procedure however YOU SHOULD NOT DRIVE, use power tools, machinery or perform tasks that involve climbing or major physical exertion for 24 hours (because of the sedation medicines used during the test).   SYMPTOMS TO REPORT IMMEDIATELY: A gastroenterologist can be reached at any hour. Please call [phone]  for any of the following symptoms:  . Following upper endoscopy (EGD, EUS, ERCP, esophageal dilation) Vomiting of blood or  coffee ground material  New, significant abdominal pain  New, significant chest pain or pain under the shoulder blades  Painful or persistently difficult swallowing  New shortness of breath  Black, tarry-looking or red, bloody stools  FOLLOW UP:  If any biopsies were taken you will be contacted by phone or by letter within the next 1-3 weeks. Call [phone]  if you have not heard about the biopsies in 3 weeks.  Please also call with any specific questions about appointments or follow up tests.

## 2020-02-23 NOTE — Anesthesia Preprocedure Evaluation (Signed)
Anesthesia Evaluation  Patient identified by MRN, date of birth, ID band Patient awake    Reviewed: Allergy & Precautions, NPO status , Patient's Chart, lab work & pertinent test results  Airway Mallampati: II       Dental  (+) Poor Dentition, Dental Advisory Given,    Pulmonary sleep apnea , former smoker,    breath sounds clear to auscultation       Cardiovascular hypertension, Pt. on medications  Rhythm:Regular  Myoview 12/05/16  Nuclear stress EF: 69%.  There was no ST segment deviation noted during stress.  The study is normal.  This is a low risk study.  The left ventricular ejection fraction is hyperdynamic (>65%).    Neuro/Psych negative neurological ROS     GI/Hepatic (+) Cirrhosis       , Hepatitis -, B  Endo/Other  diabetes, Oral Hypoglycemic Agents  Renal/GU      Musculoskeletal   Abdominal   Peds  Hematology  (+) Blood dyscrasia, anemia , Lab Results      Component                Value               Date                      WBC                      3.0 (L)             02/15/2020                HGB                      9.2 (L)             02/15/2020                HCT                      28.9 (L)            02/15/2020                MCV                      92.3                02/15/2020                PLT                      72 (L)              02/15/2020              Anesthesia Other Findings   Reproductive/Obstetrics                             Anesthesia Physical Anesthesia Plan  ASA: III  Anesthesia Plan: MAC   Post-op Pain Management:    Induction:   PONV Risk Score and Plan: Treatment may vary due to age or medical condition  Airway Management Planned: Nasal Cannula and Natural Airway  Additional Equipment: None  Intra-op Plan:   Post-operative Plan:   Informed Consent: I have reviewed the patients History and Physical, chart, labs and  discussed the  procedure including the risks, benefits and alternatives for the proposed anesthesia with the patient or authorized representative who has indicated his/her understanding and acceptance.     Dental advisory given  Plan Discussed with:   Anesthesia Plan Comments: (EGD for Anemia)        Anesthesia Quick Evaluation

## 2020-02-23 NOTE — Op Note (Signed)
[facility] Patient Name: [name] Procedure Date: 02/23/2020 MRN: [id number] Attending MD: Clarene Essex , MD Date of Birth: [birth year] CSN: [id number] Age: 73 Admit Type: Outpatient Procedure:                Upper GI endoscopy Indications:              Iron deficiency anemia, Abnormal CT of the GI tract Providers:                Clarene Essex, MD, Angus Seller, Clyde Lundborg, RN,                            Cletis Athens, Technician, Anne Fu CRNA,                            CRNA Referring MD:              Medicines:                Propofol total dose 275 mg IV Complications:            No immediate complications. Estimated Blood Loss:     Estimated blood loss: none. Procedure:                Pre-Anesthesia Assessment:                           - Prior to the procedure, a History and Physical                            was performed, and patient medications and                            allergies were reviewed. The patient's tolerance of                            previous anesthesia was also reviewed. The risks                            and benefits of the procedure and the sedation                            options and risks were discussed with the patient.                            All questions were answered, and informed consent                            was obtained. Prior Anticoagulants: The patient has                            taken no previous anticoagulant or antiplatelet                            agents. ASA Grade Assessment: III - A patient with  severe systemic disease. After reviewing the risks                            and benefits, the patient was deemed in                            satisfactory condition to undergo the procedure.                           After obtaining informed consent, the endoscope was                            passed under direct vision. Throughout the                             procedure, the patient's blood pressure, pulse, and                            oxygen saturations were monitored continuously. The                            GIF-H190 ([id number]) Olympus gastroscope was                            introduced through the mouth, and advanced to the                            jejunum. The upper GI endoscopy was accomplished                            without difficulty. The patient tolerated the                            procedure well. Scope In: Scope Out: Findings:      The larynx was normal.      Grade I varices which flattened out with air insufflation were found in       the middle third of the esophagus and in the lower third of the       esophagus. They were diminutive in size.      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      The duodenal bulb, first portion of the duodenum, second portion of the       duodenum, third portion of the duodenum and fourth portion of the       duodenum were normal.      The examined jejunum was normal.      The exam was otherwise without abnormality. Impression:               - Normal larynx.                           - Grade I very minimal esophageal varices.                           -Mild portal hypertensive gastropathy.                           -  Normal duodenal bulb, first portion of the                            duodenum, second portion of the duodenum, third                            portion of the duodenum and fourth portion of the                            duodenum.                           - Normal examined jejunum.                           - The examination was otherwise normal.                           - No specimens collected. Moderate Sedation:      Not Applicable - Patient had care per Anesthesia. Recommendation:           - Patient has a contact number available for                            emergencies. The signs and symptoms of potential                             delayed complications were discussed with the                            patient. Return to normal activities tomorrow.                            Written discharge instructions were provided to the                            patient.                           - Soft diet today.                           - Continue present medications. Consider low-dose                            beta-blocker by primary team or oncology team to                            decrease portal pressure and decrease risk of                            bleeding also might consider Carafate in the future                           - Return to GI clinic PRN.                           -  Telephone GI clinic if symptomatic PRN. Procedure Code(s):        --- Professional ---                           [phone], Esophagogastroduodenoscopy, flexible,                            transoral; diagnostic, including collection of                            specimen(s) by brushing or washing, when performed                            (separate procedure) Diagnosis Code(s):        --- Professional ---                           I85.00, Esophageal varices without bleeding                           K76.6, Portal hypertension                           K31.89, Other diseases of stomach and duodenum                           D50.9, Iron deficiency anemia, unspecified                           R93.3, Abnormal findings on diagnostic imaging of                            other parts of digestive tract CPT copyright 2019 [employer]. All rights reserved. The codes documented in this report are preliminary and upon coder review may  be revised to meet current compliance requirements. Clarene Essex, MD 02/23/2020 2:03:42 PM This report has been signed electronically. Number of Addenda: 0

## 2020-02-23 NOTE — Transfer of Care (Signed)
Immediate Anesthesia Transfer of Care Note  Patient: [name]  Procedure(s) Performed: Procedure(s): ESOPHAGOGASTRODUODENOSCOPY (EGD) WITH PROPOFOL (N/A)  Patient Location: PACU  Anesthesia Type:MAC  Level of Consciousness:  sedated, patient cooperative and responds to stimulation  Airway & Oxygen Therapy:Patient Spontanous Breathing and Patient connected to face mask oxgen  Post-op Assessment:  Report given to PACU RN and Post -op Vital signs reviewed and stable  Post vital signs:  Reviewed and stable  Last Vitals:  Vitals:   02/23/20 1237 02/23/20 1400  BP: (!) 167/77 (!) 133/59  Pulse:  67  Resp: 16 16  Temp: 36.8 C   SpO2: 093%     Complications: No apparent anesthesia complications

## 2020-02-23 NOTE — Anesthesia Postprocedure Evaluation (Signed)
Anesthesia Post Note  Patient: WILMAR PRABHAKAR  Procedure(s) Performed: ESOPHAGOGASTRODUODENOSCOPY (EGD) WITH PROPOFOL (N/A )     Patient location during evaluation: Endoscopy Anesthesia Type: MAC Level of consciousness: awake and alert Pain management: pain level controlled Vital Signs Assessment: post-procedure vital signs reviewed and stable Respiratory status: spontaneous breathing, nonlabored ventilation, respiratory function stable and patient connected to nasal cannula oxygen Cardiovascular status: blood pressure returned to baseline and stable Postop Assessment: no apparent nausea or vomiting Anesthetic complications: no   No complications documented.  Last Vitals:  Vitals:   02/23/20 1237 02/23/20 1400  BP: (!) 167/77 (!) 133/59  Pulse:  67  Resp: 16 16  Temp: 36.8 C 36.5 C  SpO2: 100%     Last Pain:  Vitals:   02/23/20 1400  TempSrc: Oral  PainSc:                  Barnet Glasgow

## 2020-02-23 NOTE — Progress Notes (Signed)
[name] 1:36 PM  Subjective: Patient is asymptomatic from a GI standpoint and no new medical issues since we saw him in the office and we rediscussed the procedure  Objective: Vital signs stable afebrile no acute distress exam please see preassessment evaluation labs and CT reviewed  Assessment: Chronic anemia signs of cirrhosis on CT scan  Plan: Okay to proceed with endoscopy with anesthesia assistance  [facility]  office [phone] After 5PM or if no answer call [phone]

## 2020-02-26 ENCOUNTER — Encounter (HOSPITAL_COMMUNITY): Payer: Self-pay | Admitting: Gastroenterology

## 2020-02-29 ENCOUNTER — Other Ambulatory Visit: Payer: Self-pay

## 2020-02-29 ENCOUNTER — Inpatient Hospital Stay: Payer: Medicare Other | Attending: Oncology

## 2020-02-29 ENCOUNTER — Inpatient Hospital Stay: Payer: Medicare Other

## 2020-02-29 VITALS — HR 70 | Temp 98.0°F | Resp 18

## 2020-02-29 DIAGNOSIS — D500 Iron deficiency anemia secondary to blood loss (chronic): Secondary | ICD-10-CM

## 2020-02-29 DIAGNOSIS — E119 Type 2 diabetes mellitus without complications: Secondary | ICD-10-CM | POA: Diagnosis not present

## 2020-02-29 DIAGNOSIS — D61818 Other pancytopenia: Secondary | ICD-10-CM | POA: Insufficient documentation

## 2020-02-29 DIAGNOSIS — D649 Anemia, unspecified: Secondary | ICD-10-CM | POA: Diagnosis not present

## 2020-02-29 DIAGNOSIS — K7460 Unspecified cirrhosis of liver: Secondary | ICD-10-CM | POA: Insufficient documentation

## 2020-02-29 DIAGNOSIS — Z79899 Other long term (current) drug therapy: Secondary | ICD-10-CM | POA: Insufficient documentation

## 2020-02-29 DIAGNOSIS — C220 Liver cell carcinoma: Secondary | ICD-10-CM | POA: Diagnosis not present

## 2020-02-29 LAB — CBC WITH DIFFERENTIAL (CANCER CENTER ONLY)
Abs Immature Granulocytes: 0.02 10*3/uL (ref 0.00–0.07)
Basophils Absolute: 0 10*3/uL (ref 0.0–0.1)
Basophils Relative: 1 %
Eosinophils Absolute: 0 10*3/uL (ref 0.0–0.5)
Eosinophils Relative: 1 %
HCT: 30.1 % — ABNORMAL LOW (ref 39.0–52.0)
Hemoglobin: 9.9 g/dL — ABNORMAL LOW (ref 13.0–17.0)
Immature Granulocytes: 1 %
Lymphocytes Relative: 17 %
Lymphs Abs: 0.5 10*3/uL — ABNORMAL LOW (ref 0.7–4.0)
MCH: 30.5 pg (ref 26.0–34.0)
MCHC: 32.9 g/dL (ref 30.0–36.0)
MCV: 92.6 fL (ref 80.0–100.0)
Monocytes Absolute: 0.3 10*3/uL (ref 0.1–1.0)
Monocytes Relative: 11 %
Neutro Abs: 2.1 10*3/uL (ref 1.7–7.7)
Neutrophils Relative %: 69 %
Platelet Count: 61 10*3/uL — ABNORMAL LOW (ref 150–400)
RBC: 3.25 MIL/uL — ABNORMAL LOW (ref 4.22–5.81)
RDW: 16.1 % — ABNORMAL HIGH (ref 11.5–15.5)
WBC Count: 3.1 10*3/uL — ABNORMAL LOW (ref 4.0–10.5)
nRBC: 0 % (ref 0.0–0.2)

## 2020-02-29 MED ORDER — SODIUM CHLORIDE 0.9 % IV SOLN
Freq: Once | INTRAVENOUS | Status: AC
  Filled 2020-02-29: qty 250

## 2020-02-29 MED ORDER — SODIUM CHLORIDE 0.9 % IV SOLN
510.0000 mg | Freq: Once | INTRAVENOUS | Status: AC
  Administered 2020-02-29: 510 mg via INTRAVENOUS
  Filled 2020-02-29: qty 510

## 2020-02-29 NOTE — Patient Instructions (Signed)

## 2020-03-28 ENCOUNTER — Other Ambulatory Visit: Payer: Self-pay

## 2020-03-28 ENCOUNTER — Inpatient Hospital Stay: Payer: Medicare Other | Attending: Oncology | Admitting: Oncology

## 2020-03-28 ENCOUNTER — Inpatient Hospital Stay: Payer: Medicare Other

## 2020-03-28 VITALS — BP 113/58 | HR 83 | Temp 97.4°F | Resp 16 | Ht 69.0 in | Wt 189.6 lb

## 2020-03-28 DIAGNOSIS — C220 Liver cell carcinoma: Secondary | ICD-10-CM

## 2020-03-28 DIAGNOSIS — K7460 Unspecified cirrhosis of liver: Secondary | ICD-10-CM | POA: Diagnosis not present

## 2020-03-28 DIAGNOSIS — D122 Benign neoplasm of ascending colon: Secondary | ICD-10-CM | POA: Insufficient documentation

## 2020-03-28 DIAGNOSIS — D500 Iron deficiency anemia secondary to blood loss (chronic): Secondary | ICD-10-CM

## 2020-03-28 DIAGNOSIS — K621 Rectal polyp: Secondary | ICD-10-CM | POA: Diagnosis not present

## 2020-03-28 DIAGNOSIS — K5730 Diverticulosis of large intestine without perforation or abscess without bleeding: Secondary | ICD-10-CM | POA: Diagnosis not present

## 2020-03-28 DIAGNOSIS — E119 Type 2 diabetes mellitus without complications: Secondary | ICD-10-CM | POA: Insufficient documentation

## 2020-03-28 DIAGNOSIS — D649 Anemia, unspecified: Secondary | ICD-10-CM | POA: Insufficient documentation

## 2020-03-28 DIAGNOSIS — K648 Other hemorrhoids: Secondary | ICD-10-CM | POA: Diagnosis not present

## 2020-03-28 DIAGNOSIS — D124 Benign neoplasm of descending colon: Secondary | ICD-10-CM | POA: Insufficient documentation

## 2020-03-28 DIAGNOSIS — K5520 Angiodysplasia of colon without hemorrhage: Secondary | ICD-10-CM | POA: Diagnosis not present

## 2020-03-28 DIAGNOSIS — D1779 Benign lipomatous neoplasm of other sites: Secondary | ICD-10-CM | POA: Insufficient documentation

## 2020-03-28 DIAGNOSIS — D61818 Other pancytopenia: Secondary | ICD-10-CM | POA: Insufficient documentation

## 2020-03-28 DIAGNOSIS — R599 Enlarged lymph nodes, unspecified: Secondary | ICD-10-CM | POA: Diagnosis not present

## 2020-03-28 DIAGNOSIS — D123 Benign neoplasm of transverse colon: Secondary | ICD-10-CM | POA: Diagnosis not present

## 2020-03-28 DIAGNOSIS — R161 Splenomegaly, not elsewhere classified: Secondary | ICD-10-CM | POA: Insufficient documentation

## 2020-03-28 DIAGNOSIS — I8510 Secondary esophageal varices without bleeding: Secondary | ICD-10-CM | POA: Insufficient documentation

## 2020-03-28 DIAGNOSIS — Z79899 Other long term (current) drug therapy: Secondary | ICD-10-CM | POA: Diagnosis not present

## 2020-03-28 DIAGNOSIS — K644 Residual hemorrhoidal skin tags: Secondary | ICD-10-CM | POA: Diagnosis not present

## 2020-03-28 LAB — CBC WITH DIFFERENTIAL (CANCER CENTER ONLY)
Abs Immature Granulocytes: 0.01 10*3/uL (ref 0.00–0.07)
Basophils Absolute: 0 10*3/uL (ref 0.0–0.1)
Basophils Relative: 1 %
Eosinophils Absolute: 0 10*3/uL (ref 0.0–0.5)
Eosinophils Relative: 2 %
HCT: 32 % — ABNORMAL LOW (ref 39.0–52.0)
Hemoglobin: 10.7 g/dL — ABNORMAL LOW (ref 13.0–17.0)
Immature Granulocytes: 0 %
Lymphocytes Relative: 21 %
Lymphs Abs: 0.6 10*3/uL — ABNORMAL LOW (ref 0.7–4.0)
MCH: 31.3 pg (ref 26.0–34.0)
MCHC: 33.4 g/dL (ref 30.0–36.0)
MCV: 93.6 fL (ref 80.0–100.0)
Monocytes Absolute: 0.2 10*3/uL (ref 0.1–1.0)
Monocytes Relative: 8 %
Neutro Abs: 1.8 10*3/uL (ref 1.7–7.7)
Neutrophils Relative %: 68 %
Platelet Count: 69 10*3/uL — ABNORMAL LOW (ref 150–400)
RBC: 3.42 MIL/uL — ABNORMAL LOW (ref 4.22–5.81)
RDW: 14.8 % (ref 11.5–15.5)
WBC Count: 2.7 10*3/uL — ABNORMAL LOW (ref 4.0–10.5)
nRBC: 0 % (ref 0.0–0.2)

## 2020-03-28 LAB — FERRITIN: Ferritin: 77 ng/mL (ref 24–336)

## 2020-03-28 NOTE — Progress Notes (Signed)
[facility] OFFICE VISIT PROGRESS NOTE  I connected with [name] on 03/28/20 at  9:00 AM EDT by video and verified that I am speaking with the correct person using two identifiers.   I discussed the limitations, risks, security and privacy concerns of performing an evaluation and management service by telemedicine and the availability of in-person appointments. I also discussed with the patient that there may be a patient responsible charge related to this service. The patient expressed understanding and agreed to proceed.   Patient's location: Office Provider's location: Home    Diagnosis: Anemia, hepatocellular carcinoma, cirrhosis  INTERVAL HISTORY:   [name] returns for a scheduled visit.  No complaint.  No bleeding.  He is taking iron.  He received IV iron on 02/15/2020 and 02/29/2020.  Objective:  Vital signs in last 24 hours:  Blood pressure (!) 113/58, pulse 83, temperature (!) 97.4 F (36.3 C), temperature source Oral, resp. rate 16, height 5\' 9"  (1.753 m), weight 189 lb 9.6 oz (86 kg), SpO2 100 %.     Lab Results:  Lab Results  Component Value Date   WBC 2.7 (L) 03/28/2020   HGB 10.7 (L) 03/28/2020   HCT 32.0 (L) 03/28/2020   MCV 93.6 03/28/2020   PLT 69 (L) 03/28/2020   NEUTROABS 1.8 03/28/2020    Imaging:  No results found.  Medications: I have reviewed the patient's current medications.  Assessment/Plan: 1. Hepatocellular carcinoma, stage I (T1 NX) status post partial left hepatectomy 03/02/2013.  08/31/2013 AFP 2.4.   CT abdomen 08/31/2013 with postoperative changes of partial hepatectomy with small postoperative fluid collection along the resection margin. No definite signs to suggest residual or locally recurrent disease. Several borderline enlarged and minimally enlarged lymph nodes superior to the liver in the juxtapericardiac fat of the lower anterior mediastinum with the largest  measuring 11 mm slightly increased compared to the prior study 01/15/2013. Several prominent non-pathologically enlarged upper abdominal ligament lymph nodes similar to the prior study. Small esophageal varices.   CT abdomen 03/03/2012 without evidence of recurrent hepatocellular carcinoma, stable right lung base nodule  CT the abdomen and pelvis 09/06/2014 without evidence of recurrent hepatocellular carcinoma, slight enlargement of pre-cardiac lymph nodes  CT abdomen/pelvis 09/07/2015 with no findings for recurrent tumor or metastatic disease. Stable small 3 mm right middle lobe pulmonary nodule since 2014. Epicardial lymph node measured 11 mm, previously 13 mm. Stable cirrhotic changes. Fairly significant esophageal varices.  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 Cirrhosis. 3. Hepatitis B core antibody positive. 4. Diabetes. 5. Severe microcytic anemia-likely iron deficiency anemia-progressive 06/03/2017. Stool Hemoccult positive 3 06/05/2017.  6. Colonoscopy 12/27/2016-external and internal hemorrhoids. Diverticulosis in the sigmoid, descending and transverse colon. One medium polyp in the distal descending colon. 3 small polyps in the transverse colon. 2 diminutive polyps in the rectum and the proximal ascending colon. Medium-sized lipoma transverse colon. Small lipoma mid ascending colon. Multiplenonbleeding colonic angiodysplastic lesions. Pathology-tubular adenomas,  hyperplastic polyps. 7. Pancytopenia secondary to cirrhosis and GI bleeding 8. Laparoscopic cholecystectomy 01/28/2018 9. Decreased iron stores 10/15/2018; transfused 2 units of blood 10/18/2018; Feraheme 11/04/2018 and 11/11/2018;  Feraheme 02/18/2019 and 02/25/2019, 02/15/2020 and 02/29/2020     Disposition: [name] appears unchanged.  He received IV iron approximately 1 month ago.  The hemoglobin is higher today.  We will follow up on the ferritin level.  He will continue oral iron.  [name] will return for an office visit and ferritin level in 2 months.   I discussed the assessment and treatment plan with the patient. The patient was provided an opportunity to ask questions and all were answered. The patient agreed with the plan and demonstrated an understanding of the instructions.   The patient was advised to call back or seek an in-person evaluation if the symptoms worsen or if the condition fails to improve as anticipated.  I provided 15 minutes of video, chart review, and documentation time during this encounter, and > 50% was spent counseling as documented under my assessment & plan.  Betsy Coder ANP/GNP-BC   03/28/2020 9:39 AM

## 2020-03-29 ENCOUNTER — Telehealth: Payer: Self-pay | Admitting: Nurse Practitioner

## 2020-03-29 NOTE — Telephone Encounter (Signed)
Scheduled appointments per 8/9 los. There was no answer when I called and voicemail was not available. I sent a letter with an updated calendar to patient's address on file.

## 2020-04-14 ENCOUNTER — Encounter: Payer: Self-pay | Admitting: Family Medicine

## 2020-04-14 ENCOUNTER — Other Ambulatory Visit: Payer: Self-pay

## 2020-04-14 ENCOUNTER — Ambulatory Visit (INDEPENDENT_AMBULATORY_CARE_PROVIDER_SITE_OTHER): Payer: Medicare Other | Admitting: Family Medicine

## 2020-04-14 VITALS — BP 122/69 | HR 88 | Temp 98.0°F | Ht 69.0 in | Wt 193.0 lb

## 2020-04-14 DIAGNOSIS — M720 Palmar fascial fibromatosis [Dupuytren]: Secondary | ICD-10-CM

## 2020-04-14 DIAGNOSIS — E785 Hyperlipidemia, unspecified: Secondary | ICD-10-CM

## 2020-04-14 DIAGNOSIS — E1165 Type 2 diabetes mellitus with hyperglycemia: Secondary | ICD-10-CM

## 2020-04-14 LAB — GLUCOSE, POCT (MANUAL RESULT ENTRY): POC Glucose: 352 mg/dl — AB (ref 70–99)

## 2020-04-14 NOTE — Progress Notes (Signed)
Subjective:  Patient ID: [name], male    DOB: 01-27-1946  Age: 74 y.o. MRN: [id number]  CC:  Chief Complaint  Patient presents with  . Follow-up    on diabetes,hyperltension, and hyperlipidemia. Pt reports no issues his BS's have been up and down, but no known physical issues. Pt reports no issues with his BP since last OV. Pt reports no physical symptoms of hypertension or hyperlipidemia. pt statees his dizzieness has gotten better since last OV. pt reports he isn't standing up real quick any more and since then he hasn't had a dizzy spell.  . pt questions    pt has a question about some bumps on his hands pt states they show up after he uses hand sanitizer. pt was informed he may have to make a seprate visit for this issue.    HPI [name] presents for   Hypertension: Previous dizziness has improved.  Has been followed by oncology, IV iron infusions with improved hemoglobin at his August 9 visit.  Continued on oral iron.  Blood pressure treated with Aldactone 25 mg daily.  No new side effects with meds.  Chronic nausea. Hx of hepatocellular CA.  No vomiting, only intermittent. EGD in July - Dr. Watt Climes: taking iron pills - was advised that this could be contributing to nausea.  Endoscopy July 6: - Normal larynx. - Grade I very minimal esophageal varices. -Mild portal hypertensive gastropathy. -Normal duodenal bulb, first portion of the duodenum, second portion of the duodenum, third portion of the duodenum and fourth portion of the duodenum. - Normal examined jejunum. - The examination was otherwise normal. - No specimens collected.   BP Readings from Last 3 Encounters:  04/14/20 122/69  03/28/20 (!) 113/58  02/23/20 (!) 164/91   Lab Results  Component Value Date   CREATININE 1.35 (H) 02/15/2020    Diabetes: Complicated by hyperglycemia previously, A1c was improved and negative.  Some question about medication adherence previously and has been  recommended to use pill minder/weekly pillbox.  Currently treated with glipizide 5 mg daily, Metformin 500 mg twice daily. Not sure he is taking glipizide. Is using pillbox. Home readings: every other morning. 180 to 200's. No symptomatic lows.  Microalbumin: Normal ratio 08/24/2019 Optho, foot exam, pneumovax: Ophthalmology 01/16/2019.  Lab Results  Component Value Date   HGBA1C 6.6 (A) 12/25/2019   HGBA1C 8.3 (A) 09/24/2019   HGBA1C 9.8 (A) 07/03/2019   Lab Results  Component Value Date   MICROALBUR 0.8 04/19/2016   LDLCALC 48 03/09/2019   CREATININE 1.35 (H) 02/15/2020   Bumps on hands: Bothers about once per month. ? Different area of hands.  No pain, no change in function. Thought due to hand sanitizer?  No rash. Declines had surgeon referral at this time.   History Patient Active Problem List   Diagnosis Date Noted  . Iron deficiency anemia 11/04/2018  . Orthostatic hypotension 08/06/2018  . Hypertension 08/06/2018  . Hyperlipidemia 08/06/2018  . Pancytopenia ([place]) 08/06/2018  . 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 Insufficient treatment with nasal CPAP 05/05/2018  . 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 Irregular heart beat 01/28/2018  . Noncompliance with CPAP treatment 08/14/2017  . Excessive daytime sleepiness 04/09/2017  . Snoring 04/09/2017  . Memory impairment of gradual onset 04/09/2017  . Aortic atherosclerosis ([place]) 11/27/2016  . 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 Altered mental status 03/05/2013  . Respiratory  failure, post-operative ([place]) 03/02/2013  . Hepatitis B 02/24/2013  . Cancer, hepatocellular ([place]) 02/10/2013  . Liver tumor-bleeding 01/15/2013  . Epidermal cyst 06/18/2012  . OSA (obstructive sleep apnea) 03/13/2012  . Abnormal leg movement 12/12/2011  . Blood pressure elevated 09/06/2011  . Diabetes mellitus ([place]) 09/06/2011  . Narcolepsy  09/06/2011  . BPH (benign prostatic hyperplasia) 09/06/2011  . Diverticula of colon 09/06/2011   Past Medical History:  Diagnosis Date  . Cancer [facility])    hepatocellular cancer   . Diabetes mellitus without complication ([place])   . Dyspnea   . Hepatitis B   . Hyperlipidemia   . Hypertension   . Irregular heart beat 01/28/2018  . Narcolepsy    per office visit note of 08/2011   . Sleep apnea    cpap   Past Surgical History:  Procedure Laterality Date  . CHOLECYSTECTOMY N/A 01/28/2018   Procedure: LAPAROSCOPIC CHOLECYSTECTOMY;  Surgeon: [name], MD;  Location: [facility];  Service: General;  Laterality: N/A;  . COLONOSCOPY WITH PROPOFOL N/A 08/27/2017   Procedure: COLONOSCOPY WITH PROPOFOL;  Surgeon: Clarene Essex, MD;  Location: [facility];  Service: Endoscopy;  Laterality: N/A;  . ESOPHAGOGASTRODUODENOSCOPY (EGD) WITH PROPOFOL N/A 02/23/2020   Procedure: ESOPHAGOGASTRODUODENOSCOPY (EGD) WITH PROPOFOL;  Surgeon: Clarene Essex, MD;  Location: [facility];  Service: Endoscopy;  Laterality: N/A;  . HOT HEMOSTASIS N/A 08/27/2017   Procedure: HOT HEMOSTASIS (ARGON PLASMA COAGULATION/BICAP);  Surgeon: Clarene Essex, MD;  Location: [facility];  Service: Endoscopy;  Laterality: N/A;  . LAPAROSCOPIC CHOLECYSTECTOMY  01/28/2018  . LAPAROSCOPY  03/02/2013   Procedure: LAPAROSCOPY DIAGNOSTIC;  Surgeon: [name], MD;  Location: [facility];  Service: General;;  . LIVER ULTRASOUND  03/02/2013   Procedure: LIVER ULTRASOUND;  Surgeon: [name], MD;  Location: [facility];  Service: General;;  . OPEN PARTIAL HEPATECTOMY   03/02/2013   Procedure: OPEN PARTIAL HEPATECTOMY [83];  Surgeon: [name], MD;  Location: [facility];  Service: General;;  DX LAPAROSCOPY, INTRAOPERATIVE LIVER ULTRASOUND, OPEN PARTIAL HEPATECTOMY  . pinched nerve in back     Allergies  Allergen Reactions  . Ace Inhibitors Swelling and Other (See Comments)    Angioedema - face.    Prior to Admission medications   Medication Sig Start Date  End Date Taking? Authorizing Provider  ACCU-CHEK SOFTCLIX LANCETS lancets Once per day testing. 08/05/17  Yes Wendie Agreste, MD  alfuzosin (UROXATRAL) 10 MG 24 hr tablet Take 1 tablet (10 mg total) by mouth at bedtime. 01/08/20  Yes Wendie Agreste, MD  amphetamine-dextroamphetamine (ADDERALL XR) 30 MG 24 hr capsule Take daily PRN by mouth with water 30 minutes before breakfast. 02/10/20  Yes Ward Givens, NP  blood glucose meter kit and supplies Dispense based on patient and insurance preference. Use once per day. 07/17/19  Yes Wendie Agreste, MD  donepezil (ARICEPT) 10 MG tablet Take 1 tablet (10 mg total) by mouth at bedtime. 06/09/19  Yes Ward Givens, NP  feeding supplement, ENSURE ENLIVE, (ENSURE ENLIVE) LIQD Take 237 mLs by mouth 2 (two) times daily between meals. 08/08/18  Yes Emokpae, Courage, MD  ferrous sulfate (KP FERROUS SULFATE) 325 (65 FE) MG tablet Take 1 tablet (325 mg total) by mouth 3 (three) times daily with meals. 08/08/18  Yes Emokpae, Courage, MD  glipiZIDE (GLUCOTROL) 5 MG tablet Take 1 tablet (5 mg total) by mouth daily before breakfast. 01/08/20  Yes Wendie Agreste, MD  glucose blood (ACCU-CHEK AVIVA PLUS) test strip 1 each by Other  route as needed for other. Use as instructed - once per day testing for now. 07/30/18  Yes Wendie Agreste, MD  glucose blood test strip Use as instructed 11/26/19  Yes Wendie Agreste, MD  metFORMIN (GLUCOPHAGE) 500 MG tablet Take 1 tablet (500 mg total) by mouth 2 (two) times daily with a meal. 01/08/20  Yes Wendie Agreste, MD  Misc. Devices (PILL BOX 7 DAY) MISC 1 Device by Does not apply route daily. Have assistant fill pill box with daily meds. 11/05/19  Yes Wendie Agreste, MD  Multiple Vitamin (MULTIVITAMIN WITH MINERALS) TABS tablet Take 1 tablet by mouth daily. 08/09/18  Yes Emokpae, Courage, MD  pravastatin (PRAVACHOL) 20 MG tablet Take 1 tablet (20 mg total) by mouth at bedtime. 01/08/20  Yes Wendie Agreste, MD    psyllium (METAMUCIL) 58.6 % powder Take 1 packet by mouth daily. Member purchased over-the-counter 09/22/18  Yes [provider]  sertraline (ZOLOFT) 25 MG tablet Take 1 tablet (25 mg total) by mouth daily. 01/08/20  Yes Wendie Agreste, MD  spironolactone (ALDACTONE) 25 MG tablet Take 1 tablet (25 mg total) by mouth daily. 01/08/20  Yes Wendie Agreste, MD   Social History   Socioeconomic History  . Marital status: Divorced    Spouse name: Not on file  . Number of children: 2  . Years of education: 15+  . Highest education level: Bachelor's degree (e.g., BA, AB, BS)  Occupational History  . Not on file  Tobacco Use  . Smoking status: Former Smoker    Years: 5.00    Types: Cigarettes  . Smokeless tobacco: Never Used  Vaping Use  . Vaping Use: Never used  Substance and Sexual Activity  . Alcohol use: No  . Drug use: No  . Sexual activity: Not Currently  Other Topics Concern  . Not on file  Social History Narrative   Patient is single and lives alone- became widower when 1 child was 67 year old, divorced 2nd marriage   Has #2 grown children, not in area or involved in his life   Patient is retired.   Patient has a college education.   Patient is right-handed.   Patient drinks maybe two sodas daily.   Social Determinants of Health   Financial Resource Strain:   . Difficulty of Paying Living Expenses: Not on file  Food Insecurity:   . Worried About Charity fundraiser in the Last Year: Not on file  . Ran Out of Food in the Last Year: Not on file  Transportation Needs:   . Lack of Transportation (Medical): Not on file  . Lack of Transportation (Non-Medical): Not on file  Physical Activity:   . Days of Exercise per Week: Not on file  . Minutes of Exercise per Session: Not on file  Stress:   . Feeling of Stress : Not on file  Social Connections:   . Frequency of Communication with Friends and Family: Not on file  . Frequency of Social Gatherings with Friends and  Family: Not on file  . Attends Religious Services: Not on file  . Active Member of Clubs or Organizations: Not on file  . Attends Archivist Meetings: Not on file  . Marital Status: Not on file  Intimate Partner Violence:   . Fear of Current or Ex-Partner: Not on file  . Emotionally Abused: Not on file  . Physically Abused: Not on file  . Sexually Abused: Not on file  Review of Systems Per HPI.   Objective:   Vitals:   04/14/20 0834  BP: 122/69  Pulse: 88  Temp: 98 F (36.7 C)  TempSrc: Temporal  SpO2: 100%  Weight: 193 lb (87.5 kg)  Height: _0  (1.753 m)     Physical Exam Vitals reviewed.  Constitutional:      Appearance: He is well-developed.  HENT:     Head: Normocephalic and atraumatic.  Eyes:     Pupils: Pupils are equal, round, and reactive to light.  Neck:     Vascular: No carotid bruit or JVD.  Cardiovascular:     Rate and Rhythm: Normal rate and regular rhythm.     Heart sounds: Normal heart sounds. No murmur heard.   Pulmonary:     Effort: Pulmonary effort is normal.     Breath sounds: Normal breath sounds. No rales.  Musculoskeletal:     Comments: Slight flexion contracture of the right fifth digit, nodular areas proximal into the palm of the hand, small nodular area on the left fourth without restriction in motion.  See photo  Skin:    General: Skin is warm and dry.  Neurological:     Mental Status: He is alert and oriented to person, place, and time.       Assessment & Plan:  [name] is a 74 y.o. male . Type 2 diabetes mellitus with hyperglycemia, without long-term current use of insulin (HCC) - Plan: POCT glucose (manual entry), Hemoglobin A1c  -Check labs, then may need office visit with his medications to clarify what he is taking.  No changes for now.  Hyperlipidemia, unspecified hyperlipidemia type - Plan: Lipid panel Check labs.  Again may need follow-up with his medications as there has been some concern in  the past what he is taking.  Contracture of palmar fascia (Dupuytren's) -No rash seen, suspected Dupuytren's contracture.  Declines referral to hand specialist at this time.  Will recheck in 1 month.  Slight contracture of 1 digit only  No orders of the defined types were placed in this encounter.  Patient Instructions    If nausea worsens, may need to discuss other medicine with your cancer doctor or discuss with stomach specialist.   I will check labs today, then may need a nurse visit to verify your medicines if changes needed.   Bumps in hands may be something called dupuytren contracture. I am happy to refer you to hand specialist when you are ready. Can discuss more next visit as well.   If you have lab work done today you will be contacted with your lab results within the next 2 weeks.  If you have not heard from [place] then please contact us. The fastest way to get your results is to register for My Chart.   IF you received an x-ray today, you will receive an invoice from [facility]. Please contact [facility] at [phone] with questions or concerns regarding your invoice.   IF you received labwork today, you will receive an invoice from [facility]. Please contact [facility] at [phone] with questions or concerns regarding your invoice.   Our billing staff will not be able to assist you with questions regarding bills from these companies.  You will be contacted with the lab results as soon as they are available. The fastest way to get your results is to activate your My Chart account. Instructions are located on the last page of this paperwork. If you have not heard from [place]  regarding the results in 2 weeks, please contact this office.         Signed, [name], MD [facility]

## 2020-04-14 NOTE — Patient Instructions (Addendum)
  If nausea worsens, may need to discuss other medicine with your cancer doctor or discuss with stomach specialist.   I will check labs today, then may need a nurse visit to verify your medicines if changes needed.   Bumps in hands may be something called dupuytren contracture. I am happy to refer you to hand specialist when you are ready. Can discuss more next visit as well.   If you have lab work done today you will be contacted with your lab results within the next 2 weeks.  If you have not heard from [place] then please contact us. The fastest way to get your results is to register for My Chart.   IF you received an x-ray today, you will receive an invoice from [facility]. Please contact [facility] at [phone] with questions or concerns regarding your invoice.   IF you received labwork today, you will receive an invoice from [place]. Please contact [facility] at [phone] with questions or concerns regarding your invoice.   Our billing staff will not be able to assist you with questions regarding bills from these companies.  You will be contacted with the lab results as soon as they are available. The fastest way to get your results is to activate your My Chart account. Instructions are located on the last page of this paperwork. If you have not heard from [place] regarding the results in 2 weeks, please contact this office.

## 2020-04-15 LAB — LIPID PANEL
Chol/HDL Ratio: 2.3 ratio (ref 0.0–5.0)
Cholesterol, Total: 144 mg/dL (ref 100–199)
HDL: 63 mg/dL (ref >39)
LDL Chol Calc (NIH): 66 mg/dL (ref 0–99)
Triglycerides: 78 mg/dL (ref 0–149)
VLDL Cholesterol Cal: 15 mg/dL (ref 5–40)

## 2020-04-15 LAB — HEMOGLOBIN A1C
Est. average glucose Bld gHb Est-mCnc: 183 mg/dL
Hgb A1c MFr Bld: 8 % — ABNORMAL HIGH (ref 4.8–5.6)

## 2020-04-16 ENCOUNTER — Encounter: Payer: Self-pay | Admitting: Family Medicine

## 2020-04-28 ENCOUNTER — Other Ambulatory Visit: Payer: Self-pay

## 2020-04-28 ENCOUNTER — Ambulatory Visit (INDEPENDENT_AMBULATORY_CARE_PROVIDER_SITE_OTHER): Payer: Medicare Other | Admitting: Family Medicine

## 2020-04-28 DIAGNOSIS — E1165 Type 2 diabetes mellitus with hyperglycemia: Secondary | ICD-10-CM

## 2020-05-03 DIAGNOSIS — Z20822 Contact with and (suspected) exposure to covid-19: Secondary | ICD-10-CM | POA: Diagnosis not present

## 2020-05-04 ENCOUNTER — Telehealth: Payer: Self-pay | Admitting: Family Medicine

## 2020-05-04 NOTE — Telephone Encounter (Signed)
FYI

## 2020-05-04 NOTE — Telephone Encounter (Signed)
Dixie calling (home care  For patient ) to let us know Mr.Culverhouse and has been tested for Covid due to recent exposure  and is waiting on results . Wanting to let his provider know

## 2020-05-19 NOTE — Progress Notes (Signed)
There were no vitals filed for this visit. Nurse visit only - pt not seen/examined.

## 2020-05-23 ENCOUNTER — Telehealth: Payer: Self-pay | Admitting: *Deleted

## 2020-05-23 NOTE — Telephone Encounter (Signed)
Schedule AWV.  

## 2020-05-24 ENCOUNTER — Telehealth: Payer: Self-pay | Admitting: *Deleted

## 2020-05-24 ENCOUNTER — Telehealth: Payer: Self-pay | Admitting: Family Medicine

## 2020-05-24 NOTE — Telephone Encounter (Signed)
Pt came in to let Almyra Free know he is ready to schedulke AWV

## 2020-05-24 NOTE — Telephone Encounter (Signed)
Schedule AWV.  

## 2020-05-30 ENCOUNTER — Other Ambulatory Visit: Payer: Self-pay

## 2020-05-30 ENCOUNTER — Inpatient Hospital Stay: Payer: Medicare Other | Admitting: Nurse Practitioner

## 2020-05-30 ENCOUNTER — Encounter: Payer: Self-pay | Admitting: Nurse Practitioner

## 2020-05-30 ENCOUNTER — Inpatient Hospital Stay: Payer: Medicare Other

## 2020-05-30 ENCOUNTER — Inpatient Hospital Stay: Payer: Medicare Other | Attending: Oncology

## 2020-05-30 VITALS — BP 146/68 | HR 78 | Temp 97.0°F | Resp 18 | Ht 69.0 in | Wt 195.9 lb

## 2020-05-30 DIAGNOSIS — Z79899 Other long term (current) drug therapy: Secondary | ICD-10-CM | POA: Diagnosis not present

## 2020-05-30 DIAGNOSIS — D500 Iron deficiency anemia secondary to blood loss (chronic): Secondary | ICD-10-CM

## 2020-05-30 DIAGNOSIS — B1910 Unspecified viral hepatitis B without hepatic coma: Secondary | ICD-10-CM | POA: Insufficient documentation

## 2020-05-30 DIAGNOSIS — E119 Type 2 diabetes mellitus without complications: Secondary | ICD-10-CM | POA: Diagnosis not present

## 2020-05-30 DIAGNOSIS — C220 Liver cell carcinoma: Secondary | ICD-10-CM

## 2020-05-30 DIAGNOSIS — D649 Anemia, unspecified: Secondary | ICD-10-CM | POA: Diagnosis not present

## 2020-05-30 DIAGNOSIS — K7460 Unspecified cirrhosis of liver: Secondary | ICD-10-CM | POA: Diagnosis not present

## 2020-05-30 LAB — CBC WITH DIFFERENTIAL (CANCER CENTER ONLY)
Abs Immature Granulocytes: 0.02 10*3/uL (ref 0.00–0.07)
Basophils Absolute: 0 10*3/uL (ref 0.0–0.1)
Basophils Relative: 1 %
Eosinophils Absolute: 0 10*3/uL (ref 0.0–0.5)
Eosinophils Relative: 1 %
HCT: 21.9 % — ABNORMAL LOW (ref 39.0–52.0)
Hemoglobin: 6.7 g/dL — CL (ref 13.0–17.0)
Immature Granulocytes: 1 %
Lymphocytes Relative: 19 %
Lymphs Abs: 0.6 10*3/uL — ABNORMAL LOW (ref 0.7–4.0)
MCH: 26.9 pg (ref 26.0–34.0)
MCHC: 30.6 g/dL (ref 30.0–36.0)
MCV: 88 fL (ref 80.0–100.0)
Monocytes Absolute: 0.3 10*3/uL (ref 0.1–1.0)
Monocytes Relative: 10 %
Neutro Abs: 2.1 10*3/uL (ref 1.7–7.7)
Neutrophils Relative %: 68 %
Platelet Count: 78 10*3/uL — ABNORMAL LOW (ref 150–400)
RBC: 2.49 MIL/uL — ABNORMAL LOW (ref 4.22–5.81)
RDW: 14.8 % (ref 11.5–15.5)
WBC Count: 3.1 10*3/uL — ABNORMAL LOW (ref 4.0–10.5)
nRBC: 0 % (ref 0.0–0.2)

## 2020-05-30 LAB — FERRITIN: Ferritin: 13 ng/mL — ABNORMAL LOW (ref 24–336)

## 2020-05-30 NOTE — Progress Notes (Signed)
[place] OFFICE PROGRESS NOTE   Diagnosis: Anemia, hepatocellular carcinoma, cirrhosis  INTERVAL HISTORY:   Mr. Urwin returns as scheduled.  He is not aware of any bleeding.  Energy level is poor.  He denies black or maroon stools.  He is taking oral iron.  He has dyspnea on exertion.  No chest pain.  Objective:  Vital signs in last 24 hours:  Blood pressure (!) 146/68, pulse 78, temperature (!) 97 F (36.1 C), temperature source Tympanic, resp. rate 18, height 5\' 9"  (1.753 m), weight 195 lb 14.4 oz (88.9 kg), SpO2 100 %.    HEENT: Conjunctival pallor. Resp: Lungs clear bilaterally. Cardio: Regular rate and rhythm. GI: Abdomen soft and nontender.  No hepatomegaly. Vascular: No leg edema.   Lab Results:  Lab Results  Component Value Date   WBC 3.1 (L) 05/30/2020   HGB 6.7 (LL) 05/30/2020   HCT 21.9 (L) 05/30/2020   MCV 88.0 05/30/2020   PLT 78 (L) 05/30/2020   NEUTROABS 2.1 05/30/2020    Imaging:  No results found.  Medications: I have reviewed the patient's current medications.  Assessment/Plan: 1. Hepatocellular carcinoma, stage I (T1 NX) status post partial left hepatectomy 03/02/2013.  08/31/2013 AFP 2.4.   CT abdomen 08/31/2013 with postoperative changes of partial hepatectomy with small postoperative fluid collection along the resection margin. No definite signs to suggest residual or locally recurrent disease. Several borderline enlarged and minimally enlarged lymph nodes superior to the liver in the juxtapericardiac fat of the lower anterior mediastinum with the largest measuring 11 mm slightly increased compared to the prior study 01/15/2013. Several prominent non-pathologically enlarged upper abdominal ligament lymph nodes similar to the prior study. Small esophageal varices.   CT abdomen 03/03/2012 without evidence of recurrent hepatocellular carcinoma, stable right lung base nodule  CT the abdomen and pelvis 09/06/2014 without  evidence of recurrent hepatocellular carcinoma, slight enlargement of pre-cardiac lymph nodes  CT abdomen/pelvis 09/07/2015 with no findings for recurrent tumor or metastatic disease. Stable small 3 mm right middle lobe pulmonary nodule since 2014. Epicardial lymph node measured 11 mm, previously 13 mm. Stable cirrhotic changes. Fairly significant esophageal varices.  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 Cirrhosis. 3. Hepatitis B core antibody positive. 4. Diabetes. 5. Severe microcytic anemia-likely iron deficiency anemia-progressive 06/03/2017. Stool Hemoccult positive 3 06/05/2017.  6. Colonoscopy 12/27/2016-external and internal hemorrhoids. Diverticulosis in the sigmoid, descending and transverse colon. One medium polyp in the distal descending colon. 3 small polyps in the transverse colon. 2 diminutive polyps in the rectum and the proximal ascending colon. Medium-sized lipoma transverse colon. Small lipoma mid ascending colon. Multiplenonbleeding colonic angiodysplastic lesions. Pathology-tubular adenomas, hyperplastic polyps. 7. Pancytopenia secondary to cirrhosis and GI bleeding 8. Laparoscopic cholecystectomy 01/28/2018 9. Decreased iron stores 10/15/2018; transfused 2 units of blood 10/18/2018; Feraheme 11/04/2018 and 11/11/2018;Feraheme 02/18/2019 and 02/25/2019, 02/15/2020 and 02/29/2020   Disposition: [name] appears unchanged.  We reviewed the CBC and ferritin  from today.  He has progressive iron deficiency anemia.  We are making arrangements for a blood transfusion as well as Feraheme.  We made a referral to Dr. Watt Climes.  He will return for a  follow-up CBC in 2 weeks, CBC and visit in 4 weeks.  We are available to see him sooner if needed.  Plan reviewed with Dr. Benay Spice.    Ned Card ANP/GNP-BC   05/30/2020  3:32 PM

## 2020-05-31 ENCOUNTER — Telehealth: Payer: Self-pay | Admitting: *Deleted

## 2020-05-31 ENCOUNTER — Telehealth: Payer: Self-pay | Admitting: Nurse Practitioner

## 2020-05-31 ENCOUNTER — Other Ambulatory Visit: Payer: Self-pay | Admitting: *Deleted

## 2020-05-31 ENCOUNTER — Other Ambulatory Visit: Payer: Self-pay

## 2020-05-31 ENCOUNTER — Inpatient Hospital Stay: Payer: Medicare Other

## 2020-05-31 DIAGNOSIS — E119 Type 2 diabetes mellitus without complications: Secondary | ICD-10-CM | POA: Diagnosis not present

## 2020-05-31 DIAGNOSIS — D649 Anemia, unspecified: Secondary | ICD-10-CM | POA: Diagnosis not present

## 2020-05-31 DIAGNOSIS — D500 Iron deficiency anemia secondary to blood loss (chronic): Secondary | ICD-10-CM

## 2020-05-31 DIAGNOSIS — K7460 Unspecified cirrhosis of liver: Secondary | ICD-10-CM | POA: Diagnosis not present

## 2020-05-31 DIAGNOSIS — C220 Liver cell carcinoma: Secondary | ICD-10-CM | POA: Diagnosis not present

## 2020-05-31 DIAGNOSIS — Z79899 Other long term (current) drug therapy: Secondary | ICD-10-CM | POA: Diagnosis not present

## 2020-05-31 LAB — PREPARE RBC (CROSSMATCH)

## 2020-05-31 NOTE — Telephone Encounter (Signed)
Scheduled appointments per 10/11 los. Called patient, no answer. Voicemail is full so was unable to leave a message. Will mail patient updated calendar with appointments dates and times.

## 2020-05-31 NOTE — Telephone Encounter (Signed)
Attempted to call Mr Pistilli, no answer/no voice mail.  LM for niece that we are trying to get Mr Lynder Parents in for labs and 2 units of blood. Requested that she have him call us so we can get this scheduled.  Niece called to say she spoke with Mr Wager. He will be here at 1200 for labs, 0900 on Wednesday @ [facility] for transfusion. Will give patient directions when he comes for labs. Orders placed

## 2020-06-01 ENCOUNTER — Inpatient Hospital Stay: Payer: Medicare Other

## 2020-06-01 DIAGNOSIS — D500 Iron deficiency anemia secondary to blood loss (chronic): Secondary | ICD-10-CM

## 2020-06-01 DIAGNOSIS — K7460 Unspecified cirrhosis of liver: Secondary | ICD-10-CM | POA: Diagnosis not present

## 2020-06-01 DIAGNOSIS — E119 Type 2 diabetes mellitus without complications: Secondary | ICD-10-CM | POA: Diagnosis not present

## 2020-06-01 DIAGNOSIS — C220 Liver cell carcinoma: Secondary | ICD-10-CM | POA: Diagnosis not present

## 2020-06-01 DIAGNOSIS — D649 Anemia, unspecified: Secondary | ICD-10-CM | POA: Diagnosis not present

## 2020-06-01 DIAGNOSIS — Z79899 Other long term (current) drug therapy: Secondary | ICD-10-CM | POA: Diagnosis not present

## 2020-06-01 MED ORDER — DIPHENHYDRAMINE HCL 25 MG PO CAPS
ORAL_CAPSULE | ORAL | Status: AC
  Filled 2020-06-01: qty 1

## 2020-06-01 MED ORDER — DIPHENHYDRAMINE HCL 25 MG PO CAPS
25.0000 mg | ORAL_CAPSULE | Freq: Once | ORAL | Status: AC
  Administered 2020-06-01: 25 mg via ORAL

## 2020-06-01 MED ORDER — HEPARIN SOD (PORK) LOCK FLUSH 100 UNIT/ML IV SOLN
500.0000 [IU] | Freq: Every day | INTRAVENOUS | Status: DC | PRN
  Filled 2020-06-01: qty 5

## 2020-06-01 MED ORDER — SODIUM CHLORIDE 0.9% IV SOLUTION
250.0000 mL | Freq: Once | INTRAVENOUS | Status: AC
  Administered 2020-06-01: 250 mL via INTRAVENOUS
  Filled 2020-06-01: qty 250

## 2020-06-01 MED ORDER — SODIUM CHLORIDE 0.9% FLUSH
10.0000 mL | INTRAVENOUS | Status: DC | PRN
  Filled 2020-06-01: qty 10

## 2020-06-01 MED ORDER — ACETAMINOPHEN 325 MG PO TABS
650.0000 mg | ORAL_TABLET | Freq: Once | ORAL | Status: AC
  Administered 2020-06-01: 650 mg via ORAL

## 2020-06-01 MED ORDER — ACETAMINOPHEN 325 MG PO TABS
ORAL_TABLET | ORAL | Status: AC
  Filled 2020-06-01: qty 2

## 2020-06-01 NOTE — Patient Instructions (Signed)

## 2020-06-01 NOTE — Progress Notes (Signed)
Pt discharged in no apparent distress. Pt left via wheelchair transported by staff..  Pt aware of discharge instructions and verbalized understanding and had no further questions.

## 2020-06-02 ENCOUNTER — Other Ambulatory Visit: Payer: Self-pay | Admitting: Oncology

## 2020-06-02 LAB — TYPE AND SCREEN
ABO/RH(D): B POS
Antibody Screen: POSITIVE
Unit division: 0
Unit division: 0

## 2020-06-02 LAB — BPAM RBC
Blood Product Expiration Date: 202111032359
Blood Product Expiration Date: 202111142359
ISSUE DATE / TIME: 202110130755
ISSUE DATE / TIME: 202110130755
Unit Type and Rh: 5100
Unit Type and Rh: 5100

## 2020-06-08 ENCOUNTER — Ambulatory Visit: Payer: Medicare Other | Admitting: Adult Health

## 2020-06-08 ENCOUNTER — Encounter: Payer: Self-pay | Admitting: Adult Health

## 2020-06-09 ENCOUNTER — Ambulatory Visit: Payer: Medicare Other | Admitting: Adult Health

## 2020-06-09 ENCOUNTER — Other Ambulatory Visit: Payer: Self-pay | Admitting: Nurse Practitioner

## 2020-06-09 ENCOUNTER — Other Ambulatory Visit: Payer: Self-pay

## 2020-06-09 ENCOUNTER — Encounter: Payer: Self-pay | Admitting: Adult Health

## 2020-06-09 VITALS — BP 154/74 | HR 77 | Ht 69.0 in | Wt 195.0 lb

## 2020-06-09 DIAGNOSIS — G3184 Mild cognitive impairment, so stated: Secondary | ICD-10-CM | POA: Diagnosis not present

## 2020-06-09 MED ORDER — DONEPEZIL HCL 10 MG PO TABS: 10.0000 mg | ORAL_TABLET | Freq: Every day | ORAL | 3 refills | Status: DC

## 2020-06-09 NOTE — Progress Notes (Signed)
PATIENT: [name] DOB: [birth year]  REASON FOR VISIT: follow up HISTORY FROM: patient  HISTORY OF PRESENT ILLNESS: Today 06/09/20:  [name] is a 74 year old male with a history of memory disturbance and narcolepsy.  He returns today for follow-up.  The patient reports that he does not have any family or friends nearby.  He lives at home alone.  Reports that he is able to complete all ADLs independently.  Reports that he operates a motor vehicle.  States that he does some cooking at home.  It is questionable whether getting an accurate history from the patient.  He reports that he is taking Adderall but according to prescription reports he has not had it filled since last year.  Reports that he is taking Aricept.  Returns today for an evaluation.  HISTORY 06/09/19:  [name] is a 74 year old male with a history of memory disturbance and narcolepsy.  He returns today for follow-up.  The patient feels that his memory is better.  He lives at home alone.  He is able to complete his ADLs independently.  He operates a Teacher, music.  He prepares his own meals.  He states in regards to his narcolepsy he only takes Adderall on an as-needed basis.  He reports that cost is a factor therefore he does not take it daily.  The patient forgot his hearing aids today.  REVIEW OF SYSTEMS: Out of a complete 14 system review of symptoms, the patient complains only of the following symptoms, and all other reviewed systems are negative.  See HPI  ALLERGIES: Allergies  Allergen Reactions  . Ace Inhibitors Swelling and Other (See Comments)    Angioedema - face.     HOME MEDICATIONS: Outpatient Medications Prior to Visit  Medication Sig Dispense Refill  . ACCU-CHEK SOFTCLIX LANCETS lancets Once per day testing. 100 each 1  . alfuzosin (UROXATRAL) 10 MG 24 hr tablet Take 1 tablet (10 mg total) by mouth at bedtime. 90 tablet 1  . amphetamine-dextroamphetamine (ADDERALL XR) 30 MG 24 hr  capsule Take daily PRN by mouth with water 30 minutes before breakfast. 30 capsule 0  . blood glucose meter kit and supplies Dispense based on patient and insurance preference. Use once per day. 1 each 0  . donepezil (ARICEPT) 10 MG tablet Take 1 tablet (10 mg total) by mouth at bedtime. 90 tablet 3  . feeding supplement, ENSURE ENLIVE, (ENSURE ENLIVE) LIQD Take 237 mLs by mouth 2 (two) times daily between meals. 60 Bottle 12  . ferrous sulfate (KP FERROUS SULFATE) 325 (65 FE) MG tablet Take 1 tablet (325 mg total) by mouth 3 (three) times daily with meals. 90 tablet 3  . glipiZIDE (GLUCOTROL) 5 MG tablet Take 1 tablet (5 mg total) by mouth daily before breakfast. 90 tablet 1  . glucose blood (ACCU-CHEK AVIVA PLUS) test strip 1 each by Other route as needed for other. Use as instructed - once per day testing for now. 100 each 1  . glucose blood test strip Use as instructed 100 each 12  . metFORMIN (GLUCOPHAGE) 500 MG tablet Take 1 tablet (500 mg total) by mouth 2 (two) times daily with a meal. 180 tablet 1  . Misc. Devices (PILL BOX 7 DAY) MISC 1 Device by Does not apply route daily. Have assistant fill pill box with daily meds. 1 each 0  . Multiple Vitamin (MULTIVITAMIN WITH MINERALS) TABS tablet Take 1 tablet by mouth daily. 30 tablet 2  . pravastatin (PRAVACHOL)  20 MG tablet Take 1 tablet (20 mg total) by mouth at bedtime. 90 tablet 1  . psyllium (METAMUCIL) 58.6 % powder Take 1 packet by mouth daily. Member purchased over-the-counter    . sertraline (ZOLOFT) 25 MG tablet Take 1 tablet (25 mg total) by mouth daily. 90 tablet 1  . spironolactone (ALDACTONE) 25 MG tablet Take 1 tablet (25 mg total) by mouth daily. 90 tablet 1   No facility-administered medications prior to visit.    PAST MEDICAL HISTORY: Past Medical History:  Diagnosis Date  . Cancer [facility])    hepatocellular cancer   . Diabetes mellitus without complication ([place])   . Dyspnea   . Hepatitis B   . Hyperlipidemia   .  Hypertension   . Irregular heart beat 01/28/2018  . Narcolepsy    per office visit note of 08/2011   . Sleep apnea    cpap    PAST SURGICAL HISTORY: Past Surgical History:  Procedure Laterality Date  . CHOLECYSTECTOMY N/A 01/28/2018   Procedure: LAPAROSCOPIC CHOLECYSTECTOMY;  Surgeon: [name], MD;  Location: [place];  Service: General;  Laterality: N/A;  . COLONOSCOPY WITH PROPOFOL N/A 08/27/2017   Procedure: COLONOSCOPY WITH PROPOFOL;  Surgeon: Clarene Essex, MD;  Location: [facility];  Service: Endoscopy;  Laterality: N/A;  . ESOPHAGOGASTRODUODENOSCOPY (EGD) WITH PROPOFOL N/A 02/23/2020   Procedure: ESOPHAGOGASTRODUODENOSCOPY (EGD) WITH PROPOFOL;  Surgeon: Clarene Essex, MD;  Location: [facility];  Service: Endoscopy;  Laterality: N/A;  . HOT HEMOSTASIS N/A 08/27/2017   Procedure: HOT HEMOSTASIS (ARGON PLASMA COAGULATION/BICAP);  Surgeon: Clarene Essex, MD;  Location: [facility];  Service: Endoscopy;  Laterality: N/A;  . LAPAROSCOPIC CHOLECYSTECTOMY  01/28/2018  . LAPAROSCOPY  03/02/2013   Procedure: LAPAROSCOPY DIAGNOSTIC;  Surgeon: [name], MD;  Location: [facility];  Service: General;;  . LIVER ULTRASOUND  03/02/2013   Procedure: LIVER ULTRASOUND;  Surgeon: [name], MD;  Location: [facility];  Service: General;;  . OPEN PARTIAL HEPATECTOMY   03/02/2013   Procedure: OPEN PARTIAL HEPATECTOMY [83];  Surgeon: [name], MD;  Location: [facility];  Service: General;;  DX LAPAROSCOPY, INTRAOPERATIVE LIVER ULTRASOUND, OPEN PARTIAL HEPATECTOMY  . pinched nerve in back      FAMILY HISTORY: Family History  Problem Relation Age of Onset  . Dementia Mother   . Cancer Father   . Diabetes Brother   . Hypertension Brother   . Cancer Brother     SOCIAL HISTORY: Social History   Socioeconomic History  . Marital status: Divorced    Spouse name: Not on file  . Number of children: 2  . Years of education: 15+  . Highest education level: Bachelor's degree (e.g., BA, AB, BS)  Occupational  History  . Not on file  Tobacco Use  . Smoking status: Former Smoker    Years: 5.00    Types: Cigarettes  . Smokeless tobacco: Never Used  Vaping Use  . Vaping Use: Never used  Substance and Sexual Activity  . Alcohol use: No  . Drug use: No  . Sexual activity: Not Currently  Other Topics Concern  . Not on file  Social History Narrative   Patient is single and lives alone- became widower when 1 child was 57 year old, divorced 2nd marriage   Has #2 grown children, not in area or involved in his life   Patient is retired.   Patient has a college education.   Patient is right-handed.   Patient drinks maybe two sodas daily.   Social Determinants  of Health   Financial Resource Strain:   . Difficulty of Paying Living Expenses: Not on file  Food Insecurity:   . Worried About Charity fundraiser in the Last Year: Not on file  . Ran Out of Food in the Last Year: Not on file  Transportation Needs:   . Lack of Transportation (Medical): Not on file  . Lack of Transportation (Non-Medical): Not on file  Physical Activity:   . Days of Exercise per Week: Not on file  . Minutes of Exercise per Session: Not on file  Stress:   . Feeling of Stress : Not on file  Social Connections:   . Frequency of Communication with Friends and Family: Not on file  . Frequency of Social Gatherings with Friends and Family: Not on file  . Attends Religious Services: Not on file  . Active Member of Clubs or Organizations: Not on file  . Attends Archivist Meetings: Not on file  . Marital Status: Not on file  Intimate Partner Violence:   . Fear of Current or Ex-Partner: Not on file  . Emotionally Abused: Not on file  . Physically Abused: Not on file  . Sexually Abused: Not on file      PHYSICAL EXAM  Vitals:   06/09/20 0748  BP: (!) 154/74  Pulse: 77  Weight: 195 lb (88.5 kg)  Height: _0  (1.753 m)   Body mass index is 28.8 kg/m.   MMSE - Mini Mental State Exam 06/09/2020  06/09/2019 05/05/2018  Not completed: Unable to complete Unable to complete -  Orientation to time _1 Orientation to Place _2 Registration 3 - 3  Attention/ Calculation 1 - 4  Recall 1 - 2  Language- name 2 objects 2 - 2  Language- repeat 1 - 1  Language- follow 3 step command 3 - 3  Language- read & follow direction 1 - 0  Write a sentence 1 - 1  Copy design 0 - 1  Total score 20 - 25     Generalized: Well developed, in no acute distress   Neurological examination  Mentation: Alert oriented to time, place, history taking. Follows all commands speech and language fluent Cranial nerve II-XII: Pupils were equal round reactive to light. Extraocular movements were full, visual field were full on confrontational test. Facial sensation and strength were normal. Uvula tongue midline. Head turning and shoulder shrug  were normal and symmetric. Motor: The motor testing reveals 5 over 5 strength of all 4 extremities. Good symmetric motor tone is noted throughout.  Sensory: Sensory testing is intact to soft touch on all 4 extremities. No evidence of extinction is noted.  Coordination: Cerebellar testing reveals good finger-nose-finger and heel-to-shin bilaterally.  Gait and station: Gait is normal.  Romberg is negative. No drift is seen.  Reflexes: Deep tendon reflexes are symmetric and normal bilaterally.   DIAGNOSTIC DATA (LABS, IMAGING, TESTING) - I reviewed patient records, labs, notes, testing and imaging myself where available.  Lab Results  Component Value Date   WBC 3.1 (L) 05/30/2020   HGB 6.7 (LL) 05/30/2020   HCT 21.9 (L) 05/30/2020   MCV 88.0 05/30/2020   PLT 78 (L) 05/30/2020      Component Value Date/Time   NA 140 02/15/2020 0844   NA 144 03/09/2019 0919   NA 138 09/03/2016 0748   K 4.1 02/15/2020 0844   K 4.4 09/03/2016 0748   CL 110 02/15/2020 0844   CO2  19 (L) 02/15/2020 0844   CO2 19 (L) 09/03/2016 0748   GLUCOSE 257 (H) 02/15/2020 0844   GLUCOSE 174  (H) 09/03/2016 0748   BUN 10 02/15/2020 0844   BUN 9 03/09/2019 0919   BUN 13.2 09/03/2016 0748   CREATININE 1.35 (H) 02/15/2020 0844   CREATININE 1.1 09/03/2016 0748   CALCIUM 8.5 (L) 02/15/2020 0844   CALCIUM 9.4 09/03/2016 0748   PROT 7.5 09/28/2019 0741   PROT 7.1 03/09/2019 0919   PROT 7.8 03/03/2014 0754   ALBUMIN 3.4 (L) 09/28/2019 0741   ALBUMIN 3.5 (L) 03/09/2019 0919   ALBUMIN 3.7 03/03/2014 0754   AST 27 09/28/2019 0741   AST 36 (H) 03/03/2014 0754   ALT 23 09/28/2019 0741   ALT 32 03/03/2014 0754   ALKPHOS 117 09/28/2019 0741   ALKPHOS 77 03/03/2014 0754   BILITOT 1.3 (H) 09/28/2019 0741   BILITOT 0.75 03/03/2014 0754   GFRNONAA 51 (L) 02/15/2020 0844   GFRNONAA 82 12/14/2015 1337   GFRAA 59 (L) 02/15/2020 0844   GFRAA >89 12/14/2015 1337   Lab Results  Component Value Date   CHOL 144 04/14/2020   HDL 63 04/14/2020   LDLCALC 66 04/14/2020   TRIG 78 04/14/2020   CHOLHDL 2.3 04/14/2020   Lab Results  Component Value Date   HGBA1C 8.0 (H) 04/14/2020   Lab Results  Component Value Date   VITAMINB12 171 (L) 08/06/2018   Lab Results  Component Value Date   TSH 0.745 06/11/2018      ASSESSMENT AND PLAN 74 y.o. year old male  has a past medical history of Cancer ([place]), Diabetes mellitus without complication ([place]), Dyspnea, Hepatitis B, Hyperlipidemia, Hypertension, Irregular heart beat (01/28/2018), Narcolepsy, and Sleep apnea. here with:  1.  Memory disturbance   MMSE 20/30  Continue Aricept  The patient lives alone and has no family or friends nearby I am concerned about his ability to manage his medications, appointments, household chores and driving.  For this reason I will ask for a home health referral for evaluation for medication management.   Narcolepsy   According to prescription history he has not been taking Adderall.  This will be discontinued from his list.  Follow-up in 6 months    I spent 35 minutes of face-to-face and  non-face-to-face time with patient.  This included previsit chart review, lab review, study review, order entry, electronic health record documentation, patient education.  Ward Givens, MSN, NP-C 06/09/2020, 8:06 AM [facility] [address], [place], [place] [address] [phone]

## 2020-06-09 NOTE — Patient Instructions (Addendum)
Your Plan:  Continue aricept for memory Will have home health come check on you If your symptoms worsen or you develop new symptoms please let us know.    Thank you for coming to see [place] at [facility]. I hope we have been able to provide you high quality care today.  You may receive a patient satisfaction survey over the next few weeks. We would appreciate your feedback and comments so that we may continue to improve ourselves and the health of our patients.

## 2020-06-10 ENCOUNTER — Other Ambulatory Visit: Payer: Self-pay

## 2020-06-10 ENCOUNTER — Inpatient Hospital Stay: Payer: Medicare Other

## 2020-06-10 VITALS — BP 145/62 | HR 76 | Temp 98.6°F | Resp 18

## 2020-06-10 DIAGNOSIS — D500 Iron deficiency anemia secondary to blood loss (chronic): Secondary | ICD-10-CM

## 2020-06-10 DIAGNOSIS — C220 Liver cell carcinoma: Secondary | ICD-10-CM | POA: Diagnosis not present

## 2020-06-10 DIAGNOSIS — K7460 Unspecified cirrhosis of liver: Secondary | ICD-10-CM | POA: Diagnosis not present

## 2020-06-10 DIAGNOSIS — E119 Type 2 diabetes mellitus without complications: Secondary | ICD-10-CM | POA: Diagnosis not present

## 2020-06-10 DIAGNOSIS — D649 Anemia, unspecified: Secondary | ICD-10-CM | POA: Diagnosis not present

## 2020-06-10 DIAGNOSIS — Z79899 Other long term (current) drug therapy: Secondary | ICD-10-CM | POA: Diagnosis not present

## 2020-06-10 MED ORDER — SODIUM CHLORIDE 0.9 % IV SOLN
510.0000 mg | Freq: Once | INTRAVENOUS | Status: AC
  Administered 2020-06-10: 510 mg via INTRAVENOUS
  Filled 2020-06-10: qty 510

## 2020-06-10 MED ORDER — SODIUM CHLORIDE 0.9 % IV SOLN
Freq: Once | INTRAVENOUS | Status: AC
  Filled 2020-06-10: qty 250

## 2020-06-10 NOTE — Patient Instructions (Signed)

## 2020-06-17 ENCOUNTER — Inpatient Hospital Stay: Payer: Medicare Other

## 2020-06-17 ENCOUNTER — Other Ambulatory Visit: Payer: Medicare Other

## 2020-06-17 ENCOUNTER — Other Ambulatory Visit: Payer: Self-pay

## 2020-06-17 VITALS — BP 129/59 | HR 76 | Temp 98.2°F | Resp 18

## 2020-06-17 DIAGNOSIS — C220 Liver cell carcinoma: Secondary | ICD-10-CM | POA: Diagnosis not present

## 2020-06-17 DIAGNOSIS — K7460 Unspecified cirrhosis of liver: Secondary | ICD-10-CM | POA: Diagnosis not present

## 2020-06-17 DIAGNOSIS — D649 Anemia, unspecified: Secondary | ICD-10-CM | POA: Diagnosis not present

## 2020-06-17 DIAGNOSIS — Z79899 Other long term (current) drug therapy: Secondary | ICD-10-CM | POA: Diagnosis not present

## 2020-06-17 DIAGNOSIS — D500 Iron deficiency anemia secondary to blood loss (chronic): Secondary | ICD-10-CM

## 2020-06-17 DIAGNOSIS — E119 Type 2 diabetes mellitus without complications: Secondary | ICD-10-CM | POA: Diagnosis not present

## 2020-06-17 LAB — CBC WITH DIFFERENTIAL (CANCER CENTER ONLY)
Abs Immature Granulocytes: 0.01 10*3/uL (ref 0.00–0.07)
Basophils Absolute: 0 10*3/uL (ref 0.0–0.1)
Basophils Relative: 1 %
Eosinophils Absolute: 0.1 10*3/uL (ref 0.0–0.5)
Eosinophils Relative: 2 %
HCT: 28.5 % — ABNORMAL LOW (ref 39.0–52.0)
Hemoglobin: 9 g/dL — ABNORMAL LOW (ref 13.0–17.0)
Immature Granulocytes: 0 %
Lymphocytes Relative: 18 %
Lymphs Abs: 0.5 10*3/uL — ABNORMAL LOW (ref 0.7–4.0)
MCH: 28.5 pg (ref 26.0–34.0)
MCHC: 31.6 g/dL (ref 30.0–36.0)
MCV: 90.2 fL (ref 80.0–100.0)
Monocytes Absolute: 0.2 10*3/uL (ref 0.1–1.0)
Monocytes Relative: 8 %
Neutro Abs: 2.2 10*3/uL (ref 1.7–7.7)
Neutrophils Relative %: 71 %
Platelet Count: 90 10*3/uL — ABNORMAL LOW (ref 150–400)
RBC: 3.16 MIL/uL — ABNORMAL LOW (ref 4.22–5.81)
RDW: 18.7 % — ABNORMAL HIGH (ref 11.5–15.5)
WBC Count: 3 10*3/uL — ABNORMAL LOW (ref 4.0–10.5)
nRBC: 0 % (ref 0.0–0.2)

## 2020-06-17 LAB — SAMPLE TO BLOOD BANK

## 2020-06-17 MED ORDER — SODIUM CHLORIDE 0.9 % IV SOLN
510.0000 mg | Freq: Once | INTRAVENOUS | Status: AC
  Administered 2020-06-17: 510 mg via INTRAVENOUS
  Filled 2020-06-17: qty 17

## 2020-06-17 MED ORDER — SODIUM CHLORIDE 0.9 % IV SOLN
Freq: Once | INTRAVENOUS | Status: AC
  Filled 2020-06-17: qty 250

## 2020-06-17 NOTE — Patient Instructions (Signed)

## 2020-06-28 ENCOUNTER — Other Ambulatory Visit: Payer: Self-pay

## 2020-06-28 ENCOUNTER — Inpatient Hospital Stay (HOSPITAL_BASED_OUTPATIENT_CLINIC_OR_DEPARTMENT_OTHER): Payer: Medicare Other | Admitting: Oncology

## 2020-06-28 ENCOUNTER — Telehealth: Payer: Self-pay | Admitting: Oncology

## 2020-06-28 ENCOUNTER — Inpatient Hospital Stay: Payer: Medicare Other | Attending: Oncology

## 2020-06-28 ENCOUNTER — Inpatient Hospital Stay: Payer: Medicare Other

## 2020-06-28 VITALS — BP 136/53 | HR 77 | Temp 97.0°F | Resp 16 | Ht 69.0 in | Wt 190.0 lb

## 2020-06-28 DIAGNOSIS — D1779 Benign lipomatous neoplasm of other sites: Secondary | ICD-10-CM | POA: Diagnosis not present

## 2020-06-28 DIAGNOSIS — I8510 Secondary esophageal varices without bleeding: Secondary | ICD-10-CM | POA: Insufficient documentation

## 2020-06-28 DIAGNOSIS — K7460 Unspecified cirrhosis of liver: Secondary | ICD-10-CM | POA: Insufficient documentation

## 2020-06-28 DIAGNOSIS — B1910 Unspecified viral hepatitis B without hepatic coma: Secondary | ICD-10-CM | POA: Insufficient documentation

## 2020-06-28 DIAGNOSIS — D500 Iron deficiency anemia secondary to blood loss (chronic): Secondary | ICD-10-CM | POA: Diagnosis not present

## 2020-06-28 DIAGNOSIS — R911 Solitary pulmonary nodule: Secondary | ICD-10-CM | POA: Insufficient documentation

## 2020-06-28 DIAGNOSIS — C220 Liver cell carcinoma: Secondary | ICD-10-CM

## 2020-06-28 DIAGNOSIS — E119 Type 2 diabetes mellitus without complications: Secondary | ICD-10-CM | POA: Insufficient documentation

## 2020-06-28 DIAGNOSIS — Z79899 Other long term (current) drug therapy: Secondary | ICD-10-CM | POA: Diagnosis not present

## 2020-06-28 DIAGNOSIS — K621 Rectal polyp: Secondary | ICD-10-CM | POA: Insufficient documentation

## 2020-06-28 DIAGNOSIS — R599 Enlarged lymph nodes, unspecified: Secondary | ICD-10-CM | POA: Insufficient documentation

## 2020-06-28 DIAGNOSIS — D509 Iron deficiency anemia, unspecified: Secondary | ICD-10-CM | POA: Insufficient documentation

## 2020-06-28 DIAGNOSIS — K648 Other hemorrhoids: Secondary | ICD-10-CM | POA: Diagnosis not present

## 2020-06-28 DIAGNOSIS — D61818 Other pancytopenia: Secondary | ICD-10-CM | POA: Diagnosis not present

## 2020-06-28 DIAGNOSIS — Z23 Encounter for immunization: Secondary | ICD-10-CM

## 2020-06-28 DIAGNOSIS — K644 Residual hemorrhoidal skin tags: Secondary | ICD-10-CM | POA: Insufficient documentation

## 2020-06-28 LAB — SAMPLE TO BLOOD BANK

## 2020-06-28 LAB — CBC WITH DIFFERENTIAL (CANCER CENTER ONLY)
Abs Immature Granulocytes: 0.01 10*3/uL (ref 0.00–0.07)
Basophils Absolute: 0 10*3/uL (ref 0.0–0.1)
Basophils Relative: 1 %
Eosinophils Absolute: 0 10*3/uL (ref 0.0–0.5)
Eosinophils Relative: 1 %
HCT: 31.4 % — ABNORMAL LOW (ref 39.0–52.0)
Hemoglobin: 10 g/dL — ABNORMAL LOW (ref 13.0–17.0)
Immature Granulocytes: 0 %
Lymphocytes Relative: 16 %
Lymphs Abs: 0.6 10*3/uL — ABNORMAL LOW (ref 0.7–4.0)
MCH: 29.7 pg (ref 26.0–34.0)
MCHC: 31.8 g/dL (ref 30.0–36.0)
MCV: 93.2 fL (ref 80.0–100.0)
Monocytes Absolute: 0.3 10*3/uL (ref 0.1–1.0)
Monocytes Relative: 8 %
Neutro Abs: 2.5 10*3/uL (ref 1.7–7.7)
Neutrophils Relative %: 74 %
Platelet Count: 68 10*3/uL — ABNORMAL LOW (ref 150–400)
RBC: 3.37 MIL/uL — ABNORMAL LOW (ref 4.22–5.81)
RDW: 19.2 % — ABNORMAL HIGH (ref 11.5–15.5)
WBC Count: 3.4 10*3/uL — ABNORMAL LOW (ref 4.0–10.5)
nRBC: 0 % (ref 0.0–0.2)

## 2020-06-28 NOTE — Progress Notes (Signed)
[place] OFFICE PROGRESS NOTE   Diagnosis: Hepatocellular carcinoma  INTERVAL HISTORY:   [name] returns as scheduled.  He received IV iron on 06/10/2020 and 06/17/2020.  He received packed red blood cells on 06/01/2020.  He reports feeling better after the red cell transfusion.  The stool is dark.  No bleeding.  He is taking iron.  Objective:  Vital signs in last 24 hours:  Blood pressure (!) 136/53, pulse 77, temperature (!) 97 F (36.1 C), temperature source Tympanic, resp. rate 16, height 5\' 9"  (1.753 m), weight 190 lb (86.2 kg), SpO2 100 %.    Lymphatics: No cervical or supraclavicular nodes Resp: Lungs clear bilaterally Cardio: Regular rate and rhythm GI: No hepatosplenomegaly Vascular: No leg edema  Lab Results:  Lab Results  Component Value Date   WBC 3.4 (L) 06/28/2020   HGB 10.0 (L) 06/28/2020   HCT 31.4 (L) 06/28/2020   MCV 93.2 06/28/2020   PLT 68 (L) 06/28/2020   NEUTROABS 2.5 06/28/2020    CMP  Lab Results  Component Value Date   NA 140 02/15/2020   K 4.1 02/15/2020   CL 110 02/15/2020   CO2 19 (L) 02/15/2020   GLUCOSE 257 (H) 02/15/2020   BUN 10 02/15/2020   CREATININE 1.35 (H) 02/15/2020   CALCIUM 8.5 (L) 02/15/2020   PROT 7.5 09/28/2019   ALBUMIN 3.4 (L) 09/28/2019   AST 27 09/28/2019   ALT 23 09/28/2019   ALKPHOS 117 09/28/2019   BILITOT 1.3 (H) 09/28/2019   GFRNONAA 51 (L) 02/15/2020   GFRAA 59 (L) 02/15/2020     Medications: I have reviewed the patient's current medications.   Assessment/Plan: 1. Hepatocellular carcinoma, stage I (T1 NX) status post partial left hepatectomy 03/02/2013.  08/31/2013 AFP 2.4.   CT abdomen 08/31/2013 with postoperative changes of partial hepatectomy with small postoperative fluid collection along the resection margin. No definite signs to suggest residual or locally recurrent disease. Several borderline enlarged and minimally enlarged lymph nodes superior to the liver in the  juxtapericardiac fat of the lower anterior mediastinum with the largest measuring 11 mm slightly increased compared to the prior study 01/15/2013. Several prominent non-pathologically enlarged upper abdominal ligament lymph nodes similar to the prior study. Small esophageal varices.   CT abdomen 03/03/2012 without evidence of recurrent hepatocellular carcinoma, stable right lung base nodule  CT the abdomen and pelvis 09/06/2014 without evidence of recurrent hepatocellular carcinoma, slight enlargement of pre-cardiac lymph nodes  CT abdomen/pelvis 09/07/2015 with no findings for recurrent tumor or metastatic disease. Stable small 3 mm right middle lobe pulmonary nodule since 2014. Epicardial lymph node measured 11 mm, previously 13 mm. Stable cirrhotic changes. Fairly significant esophageal varices.  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 Cirrhosis. 3. Hepatitis B core antibody positive. 4. Diabetes. 5. Severe microcytic anemia-likely iron deficiency anemia-progressive 06/03/2017. Stool Hemoccult positive 3 06/05/2017.  6. Colonoscopy 12/27/2016-external and internal hemorrhoids. Diverticulosis in the sigmoid, descending and transverse colon. One medium polyp in the distal descending colon. 3 small polyps in the transverse colon. 2 diminutive polyps in the rectum and the proximal ascending colon. Medium-sized lipoma transverse colon. Small lipoma mid ascending  colon.  Multiplenonbleeding colonic angiodysplastic lesions. Pathology-tubular adenomas, hyperplastic polyps. 7. Pancytopenia secondary to cirrhosis and GI bleeding 8. Laparoscopic cholecystectomy 01/28/2018 9. Decreased iron stores 10/15/2018; transfused 2 units of blood 10/18/2018 and 06/01/2020; Feraheme 11/04/2018 and 11/11/2018;Feraheme 02/18/2019 and 02/25/2019, 02/15/2020, 02/29/2020, 06/10/2020, 06/17/2020     Disposition: [name] remains in remission from hepatocellular carcinoma.  He has anemia secondary to chronic GI blood loss in the setting of advanced cirrhosis with esophageal varices.  The hemoglobin has improved following transfusion and IV iron last month.  He will continue iron.  [name] will return for lab visit in 6 weeks and an office visit in 3 months.  He received a COVID-19 booster vaccine today.  Betsy Coder, MD  06/28/2020  8:21 AM

## 2020-06-28 NOTE — Telephone Encounter (Signed)
Scheduled per 11/9 los. Per RN susan to schedule booster injection. Printed appt calendar for pt.

## 2020-06-28 NOTE — Progress Notes (Signed)
   Covid-19 Vaccination Clinic  Name:  [name]    MRN: [id number] DOB: 1946/07/16  06/28/2020  [name] was observed post Covid-19 immunization for 15 minutes without incident. He was provided with Vaccine Information Sheet and instruction to access the V-Safe system.   [name] was instructed to call 911 with any severe reactions post vaccine: [name] Kitchen Difficulty breathing  . Swelling of face and throat  . A fast heartbeat  . A bad rash all over body  . Dizziness and weakness

## 2020-07-11 ENCOUNTER — Other Ambulatory Visit: Payer: Self-pay

## 2020-07-11 ENCOUNTER — Encounter (HOSPITAL_COMMUNITY): Payer: Self-pay | Admitting: Emergency Medicine

## 2020-07-11 ENCOUNTER — Emergency Department (HOSPITAL_COMMUNITY)
Admission: EM | Admit: 2020-07-11 | Discharge: 2020-07-11 | Disposition: A | Discharge status: Home / Self Care | Payer: Medicare Other | Attending: Emergency Medicine | Admitting: Emergency Medicine

## 2020-07-11 DIAGNOSIS — Z7984 Long term (current) use of oral hypoglycemic drugs: Secondary | ICD-10-CM | POA: Diagnosis not present

## 2020-07-11 DIAGNOSIS — Z87891 Personal history of nicotine dependence: Secondary | ICD-10-CM | POA: Diagnosis not present

## 2020-07-11 DIAGNOSIS — K029 Dental caries, unspecified: Secondary | ICD-10-CM | POA: Diagnosis not present

## 2020-07-11 DIAGNOSIS — E119 Type 2 diabetes mellitus without complications: Secondary | ICD-10-CM | POA: Diagnosis not present

## 2020-07-11 DIAGNOSIS — K0889 Other specified disorders of teeth and supporting structures: Secondary | ICD-10-CM | POA: Diagnosis present

## 2020-07-11 DIAGNOSIS — Z8505 Personal history of malignant neoplasm of liver: Secondary | ICD-10-CM | POA: Diagnosis not present

## 2020-07-11 DIAGNOSIS — Z809 Family history of malignant neoplasm, unspecified: Secondary | ICD-10-CM | POA: Diagnosis not present

## 2020-07-11 DIAGNOSIS — I10 Essential (primary) hypertension: Secondary | ICD-10-CM | POA: Diagnosis not present

## 2020-07-11 MED ORDER — HYDROCODONE-ACETAMINOPHEN 5-325 MG PO TABS
1.0000 | ORAL_TABLET | Freq: Once | ORAL | Status: AC
  Administered 2020-07-11: 1 via ORAL
  Filled 2020-07-11: qty 1

## 2020-07-11 MED ORDER — HYDROCODONE-ACETAMINOPHEN 5-325 MG PO TABS
1.0000 | ORAL_TABLET | Freq: Four times a day (QID) | ORAL | 0 refills | Status: DC | PRN
Start: 2020-07-11 — End: 2020-10-05

## 2020-07-11 MED ORDER — ONDANSETRON 4 MG PO TBDP
4.0000 mg | ORAL_TABLET | Freq: Once | ORAL | Status: AC
  Administered 2020-07-11: 4 mg via ORAL
  Filled 2020-07-11: qty 1

## 2020-07-11 MED ORDER — PENICILLIN V POTASSIUM 250 MG PO TABS
500.0000 mg | ORAL_TABLET | Freq: Once | ORAL | Status: AC
  Administered 2020-07-11: 500 mg via ORAL
  Filled 2020-07-11: qty 2

## 2020-07-11 MED ORDER — ONDANSETRON 4 MG PO TBDP: 4.0000 mg | ORAL_TABLET | Freq: Four times a day (QID) | ORAL | 0 refills | Status: DC | PRN

## 2020-07-11 MED ORDER — PENICILLIN V POTASSIUM 500 MG PO TABS: 500.0000 mg | ORAL_TABLET | Freq: Four times a day (QID) | ORAL | 0 refills | Status: DC

## 2020-07-11 NOTE — ED Provider Notes (Signed)
TIME SEEN: 4:35 AM  CHIEF COMPLAINT: Dental pain  HPI: Patient is a 74 year old male with history of hypertension, diabetes, hyperlipidemia who presents to the emergency department with complaints of dental pain for the past several days.  Has been using over-the-counter Orajel without much relief.  He has a Pharmacist, community but has not followed up.  No facial swelling, difficulty swallowing, speaking or breathing.  No fevers.  ROS: See HPI Constitutional: no fever  Eyes: no drainage  ENT: no runny nose   Cardiovascular:  no chest pain  Resp: no SOB  GI: no vomiting GU: no dysuria Integumentary: no rash  Allergy: no hives  Musculoskeletal: no leg swelling  Neurological: no slurred speech ROS otherwise negative  PAST MEDICAL HISTORY/PAST SURGICAL HISTORY:  Past Medical History:  Diagnosis Date  . Cancer [facility])    hepatocellular cancer   . Diabetes mellitus without complication ([place])   . Dyspnea   . Hepatitis B   . Hyperlipidemia   . Hypertension   . Irregular heart beat 01/28/2018  . Narcolepsy    per office visit note of 08/2011   . Sleep apnea    cpap    MEDICATIONS:  Prior to Admission medications   Medication Sig Start Date End Date Taking? Authorizing Provider  ACCU-CHEK SOFTCLIX LANCETS lancets Once per day testing. 08/05/17   Wendie Agreste, MD  alfuzosin (UROXATRAL) 10 MG 24 hr tablet Take 1 tablet (10 mg total) by mouth at bedtime. 01/08/20   Wendie Agreste, MD  blood glucose meter kit and supplies Dispense based on patient and insurance preference. Use once per day. 07/17/19   Wendie Agreste, MD  donepezil (ARICEPT) 10 MG tablet Take 1 tablet (10 mg total) by mouth at bedtime. 06/09/20    Givens, NP  feeding supplement, ENSURE ENLIVE, (ENSURE ENLIVE) LIQD Take 237 mLs by mouth 2 (two) times daily between meals. Patient not taking: Reported on 06/28/2020 08/08/18   Roxan Hockey, MD  ferrous sulfate (KP FERROUS SULFATE) 325 (65 FE) MG tablet Take 1 tablet  (325 mg total) by mouth 3 (three) times daily with meals. 08/08/18   Roxan Hockey, MD  glipiZIDE (GLUCOTROL) 5 MG tablet Take 1 tablet (5 mg total) by mouth daily before breakfast. 01/08/20   Wendie Agreste, MD  glucose blood (ACCU-CHEK AVIVA PLUS) test strip 1 each by Other route as needed for other. Use as instructed - once per day testing for now. 07/30/18   Wendie Agreste, MD  glucose blood test strip Use as instructed 11/26/19   Wendie Agreste, MD  metFORMIN (GLUCOPHAGE) 500 MG tablet Take 1 tablet (500 mg total) by mouth 2 (two) times daily with a meal. 01/08/20   Wendie Agreste, MD  Misc. Devices (PILL BOX 7 DAY) MISC 1 Device by Does not apply route daily. Have assistant fill pill box with daily meds. 11/05/19   Wendie Agreste, MD  Multiple Vitamin (MULTIVITAMIN WITH MINERALS) TABS tablet Take 1 tablet by mouth daily. Patient not taking: Reported on 06/28/2020 08/09/18   Roxan Hockey, MD  pravastatin (PRAVACHOL) 20 MG tablet Take 1 tablet (20 mg total) by mouth at bedtime. 01/08/20   Wendie Agreste, MD  psyllium (METAMUCIL) 58.6 % powder Take 1 packet by mouth daily. Member purchased over-the-counter Patient not taking: Reported on 06/28/2020 09/22/18   [provider]  sertraline (ZOLOFT) 25 MG tablet Take 1 tablet (25 mg total) by mouth daily. 01/08/20   Wendie Agreste, MD  spironolactone (ALDACTONE) 25 MG tablet Take 1 tablet (25 mg total) by mouth daily. 01/08/20   Wendie Agreste, MD    ALLERGIES:  Allergies  Allergen Reactions  . Ace Inhibitors Swelling and Other (See Comments)    Angioedema - face.     SOCIAL HISTORY:  Social History   Tobacco Use  . Smoking status: Former Smoker    Years: 5.00    Types: Cigarettes  . Smokeless tobacco: Never Used  Substance Use Topics  . Alcohol use: No    FAMILY HISTORY: Family History  Problem Relation Age of Onset  . Dementia Mother   . Cancer Father   . Diabetes Brother   . Hypertension Brother    . Cancer Brother     EXAM: BP (!) 171/88 (BP Location: Right Arm)   Pulse 93   Temp 98.7 F (37.1 C) (Oral)   Resp 16   SpO2 100%  CONSTITUTIONAL: Alert and oriented and responds appropriately to questions. Well-appearing; well-nourished, elderly, in no distress, afebrile HEAD: Normocephalic EYES: Conjunctivae clear ENT: normal nose; moist mucous membranes; No pharyngeal erythema or petechiae, no tonsillar hypertrophy or exudate, no uvular deviation, no unilateral swelling, no trismus or drooling, no muffled voice, normal phonation, no stridor, multiple missing teeth and multiple dental caries present, irritation to the lower gumline with significant decay of the left lower incisor and canine with associated tenderness to palpation, no drainable dental abscess noted, no Ludwig's angina, tongue sits flat in the bottom of the mouth, no angioedema, no facial erythema or warmth, no facial swelling; no pain with movement of the neck, no cervical LAD. NECK: normal ROM CARD: RRR; S1 and S2 appreciated; no murmurs, no clicks, no rubs, no gallops RESP: Normal chest excursion without splinting or tachypnea; breath sounds clear and equal bilaterally; no wheezes, no rhonchi, no rales, no hypoxia or respiratory distress, speaking full sentences ABD/GI: Nondistended BACK:  The back appears normal EXT: Normal ROM in all joints; no deformity noted SKIN: Normal color for age and race; warm; no rash on exposed skin NEURO: Moves all extremities equally PSYCH: The patient's mood and manner are appropriate.   MEDICAL DECISION MAKING: Patient here with dental pain due to dental caries.  No obvious drainable abscess.  No Ludwig's angina.  Will place on prophylactic antibiotics and have him follow-up with his dentist.  Discharged with short course of pain medication.  At this time, I do not feel there is any life-threatening condition present. I have reviewed, interpreted and discussed all results (EKG, imaging,  lab, urine as appropriate) and exam findings with patient/family. I have reviewed nursing notes and appropriate previous records.  I feel the patient is safe to be discharged home without further emergent workup and can continue workup as an outpatient as needed. Discussed usual and customary return precautions. Patient/family verbalize understanding and are comfortable with this plan.  Outpatient follow-up has been provided as needed. All questions have been answered.      [name] was evaluated in Emergency Department on 07/11/2020 for the symptoms described in the history of present illness. He was evaluated in the context of the global COVID-19 pandemic, which necessitated consideration that the patient might be at risk for infection with the SARS-CoV-2 virus that causes COVID-19. Institutional protocols and algorithms that pertain to the evaluation of patients at risk for COVID-19 are in a state of rapid change based on information released by regulatory bodies including the CDC and federal and state organizations. These policies  and algorithms were followed during the patient's care in the ED.      Kymorah Korf, Delice Bison, DO 07/11/20 [phone]

## 2020-07-11 NOTE — ED Triage Notes (Addendum)
Pt presents to ED POV. Pt c/o dental pain. Denies difficulty swallowing or swelling. NAD  Pt very hard of hearing

## 2020-07-11 NOTE — Discharge Instructions (Signed)
Please call in the morning to schedule close outpatient follow-up with your dentist.

## 2020-07-20 ENCOUNTER — Telehealth: Payer: Self-pay | Admitting: Family Medicine

## 2020-07-20 DIAGNOSIS — G3184 Mild cognitive impairment, so stated: Secondary | ICD-10-CM

## 2020-07-20 DIAGNOSIS — E1165 Type 2 diabetes mellitus with hyperglycemia: Secondary | ICD-10-CM

## 2020-07-20 NOTE — Telephone Encounter (Signed)
Shauffon Madaline Savage is pt's in home aid from [facility] calling to ask if you will refer pt for Remote health to [facility] for medication management, and anything else he could benefit from or they offer. Their fax number:  [phone]

## 2020-07-20 NOTE — Telephone Encounter (Signed)
Dr. Carlota Raspberry this patient was last seen on 04/28/20. Are there any thing else we can refer this patient for  besides medication management

## 2020-07-21 NOTE — Telephone Encounter (Signed)
I see that he was referred to home health from neurology, but certainly can refer to [facility] as well.  Will place order.

## 2020-07-26 ENCOUNTER — Other Ambulatory Visit: Payer: Self-pay

## 2020-07-26 NOTE — Patient Outreach (Signed)
[facility]) Care Management  07/26/2020  [name] 03-31-1946 [id number]   New referral from previously assigned patient to another case manager.  Reviewed referral from MD office Noted that personal care giver called MD office to get patient assistance with medications.   Placed call to patient and reviewed reason for call. Patient reports to me that he lives alone.  I attempted to figure out needs, however patient had difficulty hearing me and appeared confused about my questions.  Patient gave me permission to speak with his niece [name].  I placed call to niece without an answer. Left a message requesting a call back.   PLAN: will attempt to reach niece again in 3 day is if no return call. Will send outreach letter as well.  Tomasa Rand, RN, BSN, CEN [facility] [employer] [phone]

## 2020-07-26 NOTE — Patient Outreach (Signed)
[facility]) Care Management  07/26/2020  [name] July 25, 1946 [id number]   Assessment of needs: Return call from [facility] who reports patient is doing well with medications. Reports that he gets mobile meals and does not like the food.  Ms.Narayanan wants to know about other resources for food and needs assistance with the [facility] program.   Reviewed any additional needs with patients health and niece denies.    PLAN: Will place order for social worker to assist with food options.Neice denies any other needs.  Tomasa Rand, RN, BSN, CEN [facility] [employer] [phone]

## 2020-07-27 ENCOUNTER — Encounter: Payer: Self-pay | Admitting: *Deleted

## 2020-07-27 ENCOUNTER — Other Ambulatory Visit: Payer: Self-pay | Admitting: *Deleted

## 2020-07-27 NOTE — Patient Outreach (Signed)
[facility]) Care Management  07/27/2020  QUENCY TOBER [birth year] [id number]   CSW was able to make initial contact with patient's niece, Hiep Ollis today, to perform the phone assessment on patient, as well as assess and assist with social work needs and services.  CSW introduced self, explained role and types of services provided through [place] [facility] ([place] [facility]).  CSW further explained to Ms. Boehringer that [place] works with patient's RNCM, also with [place] [facility], Tomasa Rand.  CSW then explained the reason for the call, indicating that Ms. Lacinda Axon thought that patient would benefit from social work services and resources to assist with obtaining food and nutritional services.  CSW obtained two HIPAA compliant identifiers from Ms. Bettenhausen, which included patient's name and date of birth.  Ms. Bulnes reported that patient is currently receiving Meals on Wheels, through [employer] [facility], but that he does not like the meals, often not even eating them.  CSW offered to set patient up with a 28 day supply of Mom's Meals, courtesy of [facility], for which Ms. Arviso politely declined.  Ms. Kulpa then stated, "If he won't eat the food from Me[employer] of Whe[employer], which is a Psychologist, sport and exercise, he definitely won't eat the meals from an agency that he is not at all familiar with.  Ms. Polidori went on to explain that she is really just interested in applying for Food Stamps for patient, giving him the opportunity to pick out his own food choices when performing his grocery shopping.     CSW agreed to e-mail ([email or web address]) Ms. Ritacco, per her request, a [employer] Charity fundraiser) application, formerly known as Physicist, medical, and offered to assist her with completion of the application, if necessary.  Again, Ms. Mchugh politely denied the need for application  assistance, admitting that she may actually go to the [place] to apply in-person.  CSW also agreed to e-mail Ms. Rauch the following list of food resources:  [facility] [place] [facility] in [place], and [employer] [facility] in [place].  Ms. Abel was agreeable to having CSW follow-up with her again next week, on Thursday, August 04, 2020, around 10:00AM.  Nat Christen, BSW, MSW, [employer]  Licensed Clinical Social Worker  [place]  Mailing [place] [address] [place], [place] [address] Physical Address-[address] [place],  [place] [address] Toll Free Main # [phone] Fax # [phone] Cell # [phone]  [email or web address]

## 2020-07-29 ENCOUNTER — Ambulatory Visit: Payer: Self-pay

## 2020-08-03 ENCOUNTER — Other Ambulatory Visit: Payer: Self-pay | Admitting: *Deleted

## 2020-08-03 ENCOUNTER — Encounter: Payer: Self-pay | Admitting: *Deleted

## 2020-08-03 NOTE — Patient Outreach (Signed)
[facility]) Care Management  08/03/2020  [name] 10/07/1945 [id number]   CSW received an incoming call from patient's niece, Rhonda Steinhaus today, indicating that she had a couple of questions that she needed to address with CSW.  Ms. Pflug first confirmed receipt of the list of food resources and SNAP (Supplemental Nutrition Assistance Program) application that CSW e-mailed to her last week, per her request.  Ms. Harriott indicated that she is in the process of completing the SNAP application with patient, learning a lot about his current financial situation and outstanding debts.  Ms. Scoggins reported that she is now trying to get all of patient's affairs in order, and is able to do so because he has elected her as his Financial and Healthcare Power of Attorney.  Ms. Negron was able to successfully provide two HIPAA compliant identifiers for patient.   First, Ms. [name] wanted to know if it would be possible for patient to receive any one of his three ex-wives Social Security Income, or if he would even be eligible to receive all three.  CSW explained to Ms. Wilden that patient would only be able to draw from one of his ex-wives Social Security Income, preferably the one that receives the largest amount, if their marriage lasted for at least 10 years, they did not remarry, and if their married spouse is not already claiming their Social Security Income.  CSW further explained to Ms. Solano that there are a lot of parameters, encouraging her to contact a representative with Social Security Administration to have them address these questions directly with her, providing her with the correct contact information for Social Security Administration.  Second, Ms. Anastos inquired about applying for energy assistance for patient, through [employer], wanting to know if patient would be eligible to receive financial assistance to help pay his  electricity bill.  CSW spoke with Ms. Peine at length about ([employer]) Low Income Home Energy Assistance Program, e-mailing her an application while conversing over the phone, then confirming receipt before terminating the call.  CSW also provided Ms. Dolin with the address and contact number for [employer].  Ms. Giller denied needing assistance with application completion or submission, nor did she have any specific questions regarding the application process.  Ms. Klomp stated that she has plenty of extra time on her hands to be able to offer some much needed assistance to patient, in addition to researching community agencies and resources that may be able to offer services and/or financial assistance.   Last, Ms. Kristensen reported that she would like to see if patient qualifies to receive a reduction in his property taxes, admitting that he has gotten behind on making payments for the last several years.  CSW explained to Ms. Pentland that senior property tax exemptions do exist, and significantly reduces the amount seniors have to pay in taxes on properties they own.  CSW went on to explain that property tax exemptions do not have any effect on the tax rate, nor do they come out of patient's tax bill.  Instead, the value of the home is decreased, usually the portion that is subject to taxation.  CSW further explained to Ms. Navia that she will first need to find out the property exemptions that are offered in [place] for seniors, then apply for the property tax exemption on patient's behalf.   CSW will perform a case closure on patient, as all goals of treatment have been met from social work standpoint and   no additional social work needs have been identified at this time.  CSW will notify patient's RNCM with [facility], Amanda Cook of CSW's plans to close patient's case.  CSW will fax an update to patient's Primary Care Physician, Dr. Jeffrey  Greene to ensure that he is aware of CSW's involvement with patient's plan of care, in addition to routing Dr. Greene a Physician Case Closure Letter.  CSW was able to confirm that Ms. Wendorff has the correct contact information for CSW, encouraging her to contact CSW directly if she changes her mind about needing assistance with application completion of submission, or if any additional social work needs arise in the near future.  Ms. Shukla voiced understanding and was agreeable to this plan.     , BSW, MSW, LCSW  Licensed Clinical Social Worker  [facility]  Mailing Address-[address], [place], [place] [address] Physical Address-[address], [place], [place] [address] Toll Free Main # [phone] Fax # [phone] Cell # [phone]  [email or web address]        

## 2020-08-04 ENCOUNTER — Ambulatory Visit: Payer: Self-pay | Admitting: *Deleted

## 2020-08-09 ENCOUNTER — Inpatient Hospital Stay: Payer: Medicare Other | Attending: Oncology

## 2020-09-01 ENCOUNTER — Telehealth: Payer: Self-pay | Admitting: Family Medicine

## 2020-09-01 ENCOUNTER — Encounter: Payer: Self-pay | Admitting: Family Medicine

## 2020-09-01 ENCOUNTER — Other Ambulatory Visit: Payer: Self-pay

## 2020-09-01 ENCOUNTER — Ambulatory Visit (INDEPENDENT_AMBULATORY_CARE_PROVIDER_SITE_OTHER): Payer: Medicare Other | Admitting: Family Medicine

## 2020-09-01 VITALS — BP 142/70 | HR 86 | Temp 97.8°F | Wt 190.0 lb

## 2020-09-01 DIAGNOSIS — G3184 Mild cognitive impairment, so stated: Secondary | ICD-10-CM

## 2020-09-01 DIAGNOSIS — E1165 Type 2 diabetes mellitus with hyperglycemia: Secondary | ICD-10-CM

## 2020-09-01 DIAGNOSIS — H538 Other visual disturbances: Secondary | ICD-10-CM | POA: Diagnosis not present

## 2020-09-01 DIAGNOSIS — I10 Essential (primary) hypertension: Secondary | ICD-10-CM | POA: Diagnosis not present

## 2020-09-01 DIAGNOSIS — H9190 Unspecified hearing loss, unspecified ear: Secondary | ICD-10-CM

## 2020-09-01 NOTE — Patient Instructions (Addendum)
Keep follow-up with your eye specialist in March.  If any change in your eye symptoms prior to that time, new headaches or other new symptoms be seen right away.  I will check some blood work today but I am not sure what medications you are taking for diabetes and blood pressure.  I will try to have someone from [facility] visit you to help clarify what medicines you are taking at home and once I have your labs we can decide on changes.  Follow-up with me in the office in 2 weeks so we can review labs and that plan further.  Return to the clinic or go to the nearest emergency room if any of your symptoms worsen or new symptoms occur.    If you have lab work done today you will be contacted with your lab results within the next 2 weeks.  If you have not heard from [place] then please contact us. The fastest way to get your results is to register for My Chart.   IF you received an x-ray today, you will receive an invoice from [facility]. Please contact [facility] at [phone] with questions or concerns regarding your invoice.   IF you received labwork today, you will receive an invoice from [facility]. Please contact [facility] at [phone] with questions or concerns regarding your invoice.   Our billing staff will not be able to assist you with questions regarding bills from these companies.  You will be contacted with the lab results as soon as they are available. The fastest way to get your results is to activate your My Chart account. Instructions are located on the last page of this paperwork. If you have not heard from [place] regarding the results in 2 weeks, please contact this office.

## 2020-09-01 NOTE — Progress Notes (Signed)
Subjective:  Patient ID: [name], male    DOB: Dec 28, 1945  Age: 75 y.o. MRN: [id number]  CC:  Chief Complaint  Patient presents with  . Follow-up    On diabetes and Hyperlipidemia.PT reports his BS has been running high in the 200's.. no physical symptoms of elevated BS to report.    HPI [name] presents for   Wearing hearing aid, but still some trouble hearing me today.  Out of a few medicines - ? Aricept, metamucil,   Had break-ins in house.  Has some help at home -  4 days per week?   Diabetes: Complicated by hyperglycemia, some previous concern regarding medication adherence.  He was on glipizide 5 mg daily, metformin 500 mg twice daily at last visit in September but unsure if he was taking glipizide at that time.  Recent readings in the 200s. Microalbumin: Normal ratio in January 2021 Difficulty with history.  He went home for his meds and returned to office - only brought back one med donepezil.    Lab Results  Component Value Date   HGBA1C 8.0 (H) 04/14/2020   HGBA1C 6.6 (A) 12/25/2019   HGBA1C 8.3 (A) 09/24/2019   Lab Results  Component Value Date   MICROALBUR 0.8 04/19/2016   LDLCALC 66 04/14/2020   CREATININE 1.35 (H) 02/15/2020   Hypertension: Followed by oncology with history of hepatocellular cancer. Spironolactone 25 mg daily. Recent readings have been elevated.  Creatinine variable but has been 0.86-1.35 over the past few years. Did not bring meds with him today. Not sure what he is taking for his blood pressure.  Home readings: BP Readings from Last 3 Encounters:  07/11/20 (!) 171/88  06/28/20 (!) 136/53  06/17/20 (!) 129/59   Lab Results  Component Value Date   CREATININE 1.35 (H) 02/15/2020   Blurry vision Right side.  Reports intermittent symptoms since a fall a few years ago.  Reportedly he has discussed this with ophthalmology and has been evaluated for same in the past.  Has appointment in March.   .     History Patient Active Problem List   Diagnosis Date Noted  . Iron deficiency anemia 11/04/2018  . Orthostatic hypotension 08/06/2018  . Hypertension 08/06/2018  . Hyperlipidemia 08/06/2018  . Pancytopenia ([place]) 08/06/2018  . 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 Insufficient treatment with nasal CPAP 05/05/2018  . 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 Irregular heart beat 01/28/2018  . Noncompliance with CPAP treatment 08/14/2017  . Excessive daytime sleepiness 04/09/2017  . Snoring 04/09/2017  . Memory impairment of gradual onset 04/09/2017  . Aortic atherosclerosis ([place]) 11/27/2016  . 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 Altered mental status 03/05/2013  . Respiratory failure, post-operative ([place]) 03/02/2013  . Hepatitis B 02/24/2013  . Cancer, hepatocellular ([place]) 02/10/2013  . Liver tumor-bleeding 01/15/2013  . Epidermal cyst 06/18/2012  . OSA (obstructive sleep apnea) 03/13/2012  . Abnormal leg movement 12/12/2011  . Blood pressure elevated 09/06/2011  . Diabetes mellitus ([place]) 09/06/2011  . 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 BPH (benign prostatic hyperplasia) 09/06/2011  . Diverticula of colon 09/06/2011   Past Medical History:  Diagnosis Date  . Cancer [facility])    hepatocellular cancer   . Diabetes mellitus without complication ([place])   . Dyspnea   . Hepatitis B   . Hyperlipidemia   . Hypertension   . Irregular heart beat 01/28/2018  . Narcolepsy  per office visit note of 08/2011   . Sleep apnea    cpap   Past Surgical History:  Procedure Laterality Date  . CHOLECYSTECTOMY N/A 01/28/2018   Procedure: LAPAROSCOPIC CHOLECYSTECTOMY;  Surgeon: Byerly, Faera, MD;  Location: [facility] OR;  Service: General;  Laterality: N/A;  . COLONOSCOPY WITH PROPOFOL N/A 08/27/2017   Procedure: COLONOSCOPY WITH PROPOFOL;  Surgeon: Magod, Marc, MD;  Location: [facility] ENDOSCOPY;  Service:  Endoscopy;  Laterality: N/A;  . ESOPHAGOGASTRODUODENOSCOPY (EGD) WITH PROPOFOL N/A 02/23/2020   Procedure: ESOPHAGOGASTRODUODENOSCOPY (EGD) WITH PROPOFOL;  Surgeon: Magod, Marc, MD;  Location: [facility] ENDOS[facility];  Service: Endoscopy;  Laterality: N/A;  . HOT HEMOSTASIS N/A 08/27/2017   Procedure: HOT HEMOSTASIS (ARGON PLASMA COAGULATION/BICAP);  Surgeon: Magod, Marc, MD;  Location: [facility] ENDOS[facility];  Service: Endoscopy;  Laterality: N/A;  . LAPAROSCOPIC CHOLECYSTECTOMY  01/28/2018  . LAPAROSCOPY  03/02/2013   Procedure: LAPAROSCOPY DIAGNOSTIC;  Surgeon: Faera Byerly, MD;  Location: [facility] ORS;  Service: General;;  . LIVER ULTRASOUND  03/02/2013   Procedure: LIVER ULTRASOUND;  Surgeon: Faera Byerly, MD;  Location: [facility] ORS;  Service: General;;  . OPEN PARTIAL HEPATECTOMY   03/02/2013   Procedure: OPEN PARTIAL HEPATECTOMY [83];  Surgeon: Faera Byerly, MD;  Location: [facility] ORS;  Service: General;;  DX LAPAROSCOPY, INTRAOPERATIVE LIVER ULTRASOUND, OPEN PARTIAL HEPATECTOMY  . pinched nerve in back     Allergies  Allergen Reactions  . Ace Inhibitors Swelling and Other (See Comments)    Angioedema - face.    Prior to Admission medications   Medication Sig Start Date End Date Taking? Authorizing Provider  ACCU-CHEK SOFTCLIX LANCETS lancets Once per day testing. 08/05/17  Yes ,  R, MD  alfuzosin (UROXATRAL) 10 MG 24 hr tablet Take 1 tablet (10 mg total) by mouth at bedtime. 01/08/20  Yes ,  R, MD  blood glucose meter kit and supplies Dispense based on patient and insurance preference. Use once per day. 07/17/19  Yes ,  R, MD  donepezil (ARICEPT) 10 MG tablet Take 1 tablet (10 mg total) by mouth at bedtime. 06/09/20  Yes Millikan, Megan, NP  feeding supplement, ENSURE ENLIVE, (ENSURE ENLIVE) LIQD Take 237 mLs by mouth 2 (two) times daily between meals. 08/08/18  Yes Emokpae, Courage, MD  ferrous sulfate (KP FERROUS SULFATE) 325 (65 FE) MG tablet Take 1 tablet (325 mg total) by mouth 3  (three) times daily with meals. 08/08/18  Yes Emokpae, Courage, MD  glipiZIDE (GLUCOTROL) 5 MG tablet Take 1 tablet (5 mg total) by mouth daily before breakfast. 01/08/20  Yes ,  R, MD  glucose blood (ACCU-CHEK AVIVA PLUS) test strip 1 each by Other route as needed for other. Use as instructed - once per day testing for now. 07/30/18  Yes ,  R, MD  glucose blood test strip Use as instructed 11/26/19  Yes ,  R, MD  HYDROcodone-acetaminophen (NORCO/VICODIN) 5-325 MG tablet Take 1 tablet by mouth every 6 (six) hours as needed. 07/11/20  Yes Ward, Kristen N, DO  metFORMIN (GLUCOPHAGE) 500 MG tablet Take 1 tablet (500 mg total) by mouth 2 (two) times daily with a meal. 01/08/20  Yes ,  R, MD  Misc. Devices (PILL BOX 7 DAY) MISC 1 Device by Does not apply route daily. Have assistant fill pill box with daily meds. 11/05/19  Yes ,  R, MD  spironolactone (ALDACTONE) 25 MG tablet Take 1 tablet (25 mg total) by mouth daily. 01/08/20  Yes ,  R, MD  Multiple Vitamin (MULTIVITAMIN WITH MINERALS)   TABS tablet Take 1 tablet by mouth daily. Patient not taking: No sig reported 08/09/18   Roxan Hockey, MD  ondansetron (ZOFRAN ODT) 4 MG disintegrating tablet Take 1 tablet (4 mg total) by mouth every 6 (six) hours as needed. Patient not taking: Reported on 09/01/2020 07/11/20   Ward, Delice Bison, DO  penicillin v potassium (VEETID) 500 MG tablet Take 1 tablet (500 mg total) by mouth 4 (four) times daily. Patient not taking: Reported on 09/01/2020 07/11/20   Ward, Delice Bison, DO  pravastatin (PRAVACHOL) 20 MG tablet Take 1 tablet (20 mg total) by mouth at bedtime. Patient not taking: Reported on 09/01/2020 01/08/20   Wendie Agreste, MD  psyllium (METAMUCIL) 58.6 % powder Take 1 packet by mouth daily. Member purchased over-the-counter Patient not taking: No sig reported 09/22/18   [provider]  sertraline (ZOLOFT) 25 MG tablet Take 1 tablet  (25 mg total) by mouth daily. Patient not taking: Reported on 09/01/2020 01/08/20   Wendie Agreste, MD   Social History   Socioeconomic History  . Marital status: Divorced    Spouse name: Not on file  . Number of children: 2  . Years of education: 15+  . Highest education level: Bachelor's degree (e.g., BA, AB, BS)  Occupational History  . Occupation: Retired  Tobacco Use  . Smoking status: Former Smoker    Years: 5.00    Types: Cigarettes  . Smokeless tobacco: Never Used  Vaping Use  . Vaping Use: Never used  Substance and Sexual Activity  . Alcohol use: No  . Drug use: No  . Sexual activity: Not Currently  Other Topics Concern  . Not on file  Social History Narrative   Patient is single and lives alone- became widower when 1 child was 78 year old, divorced 2nd marriage   Has #2 grown children, not in area or involved in his life   Patient is retired.   Patient has a college education.   Patient is right-handed.   Patient drinks maybe two sodas daily.   Social Determinants of Health   Financial Resource Strain: Low Risk   . Difficulty of Paying Living Expenses: Not hard at all  Food Insecurity: Food Insecurity Present  . Worried About Charity fundraiser in the Last Year: Sometimes true  . Ran Out of Food in the Last Year: Sometimes true  Transportation Needs: No Transportation Needs  . Lack of Transportation (Medical): No  . Lack of Transportation (Non-Medical): No  Physical Activity: Inactive  . Days of Exercise per Week: 0 days  . Minutes of Exercise per Session: 0 min  Stress: No Stress Concern Present  . Feeling of Stress : Not at all  Social Connections: Moderately Isolated  . Frequency of Communication with Friends and Family: More than three times a week  . Frequency of Social Gatherings with Friends and Family: More than three times a week  . Attends Religious Services: 1 to 4 times per year  . Active Member of Clubs or Organizations: No  . Attends English as a second language teacher Meetings: Never  . Marital Status: Divorced  Human resources officer Violence: Not At Risk  . Fear of Current or Ex-Partner: No  . Emotionally Abused: No  . Physically Abused: No  . Sexually Abused: No    Review of Systems Per HPI.   Objective:   Vitals:   09/01/20 1530  BP: (!) 142/70  Pulse: 86  Temp: 97.8 F (36.6 C)  SpO2: 100%  Weight: 190 lb (86.2 kg)     Physical Exam Vitals reviewed.  Constitutional:      Appearance: He is well-developed and well-nourished.  HENT:     Head: Normocephalic and atraumatic.  Eyes:     Extraocular Movements: EOM normal.     Pupils: Pupils are equal, round, and reactive to light.  Neck:     Vascular: No carotid bruit or JVD.  Cardiovascular:     Rate and Rhythm: Normal rate and regular rhythm.     Heart sounds: Normal heart sounds. No murmur heard.   Pulmonary:     Effort: Pulmonary effort is normal.     Breath sounds: Normal breath sounds. No rales.  Musculoskeletal:        General: No edema.  Skin:    General: Skin is warm and dry.  Neurological:     Mental Status: He is alert and oriented to person, place, and time.  Psychiatric:        Mood and Affect: Mood and affect normal.        Assessment & Plan:  [name] is a 75 y.o. male . Type 2 diabetes mellitus with hyperglycemia, without long-term current use of insulin (HCC) - Plan: Hemoglobin A1c  -Previously worsening control.  Difficult situation as I am unsure what he is truly taking at home.  Some difficulty with communication with his hearing difficulty, used paper and writing for communication throughout visit.  We will check A1c, [facility] referral previously sent, hopefully can clarify his medications at home and for medication assistance.  2-week follow-up planned with his meds.   Mild cognitive impairment  -On Aricept, followed by neurology.  Advised him that there are refills available of Aricept at his pharmacy.  Hearing difficulty,  unspecified laterality  -As above written communication during visit.  Understanding of plan expressed.  He plans on checking into his hearing aid as it does not appear to be functioning.  Essential hypertension - Plan: Comprehensive metabolic panel  -Borderline control, unsure of his home regimen.  Med clarification as above with 2-week follow-up  Blurred vision, right eye  -Intermittent since reported fall few years ago and has been evaluated by ophthalmology.  Still with intermittent symptoms.  Plan is to see ophthalmologist in March.  RTC precautions given if any acute changes.  No orders of the defined types were placed in this encounter.  Patient Instructions   Keep follow-up with your eye specialist in March.  If any change in your eye symptoms prior to that time, new headaches or other new symptoms be seen right away.  I will check some blood work today but I am not sure what medications you are taking for diabetes and blood pressure.  I will try to have someone from [facility] visit you to help clarify what medicines you are taking at home and once I have your labs we can decide on changes.  Follow-up with me in the office in 2 weeks so we can review labs and that plan further.  Return to the clinic or go to the nearest emergency room if any of your symptoms worsen or new symptoms occur.    If you have lab work done today you will be contacted with your lab results within the next 2 weeks.  If you have not heard from [place] then please contact us. The fastest way to get your results is to register for My Chart.   IF you received an x-ray today,  you will receive an invoice from [facility]. Please contact [facility] at [phone] with questions or concerns regarding your invoice.   IF you received labwork today, you will receive an invoice from [place]. Please contact [facility] at [phone] with questions or concerns regarding your invoice.   Our  billing staff will not be able to assist you with questions regarding bills from these companies.  You will be contacted with the lab results as soon as they are available. The fastest way to get your results is to activate your My Chart account. Instructions are located on the last page of this paperwork. If you have not heard from [place] regarding the results in 2 weeks, please contact this office.         Signed, [name], MD [facility]

## 2020-09-01 NOTE — Telephone Encounter (Signed)
Please check into [facility] referral. Some concern of med adherence, med availability. He does have home assistance few days per week, but some other concerns by [place] services. Referral placed in December. Let me know if something needed from me. Thanks.

## 2020-09-02 LAB — COMPREHENSIVE METABOLIC PANEL
ALT: 25 IU/L (ref 0–44)
AST: 38 IU/L (ref 0–40)
Albumin/Globulin Ratio: 0.9 — ABNORMAL LOW (ref 1.2–2.2)
Albumin: 3.5 g/dL — ABNORMAL LOW (ref 3.7–4.7)
Alkaline Phosphatase: 120 IU/L (ref 44–121)
BUN/Creatinine Ratio: 9 — ABNORMAL LOW (ref 10–24)
BUN: 10 mg/dL (ref 8–27)
Bilirubin Total: 0.9 mg/dL (ref 0.0–1.2)
CO2: 22 mmol/L (ref 20–29)
Calcium: 8.8 mg/dL (ref 8.6–10.2)
Chloride: 104 mmol/L (ref 96–106)
Creatinine, Ser: 1.1 mg/dL (ref 0.76–1.27)
GFR calc Af Amer: 75 mL/min/{1.73_m2} (ref >59)
GFR calc non Af Amer: 65 mL/min/{1.73_m2} (ref >59)
Globulin, Total: 3.7 g/dL (ref 1.5–4.5)
Glucose: 285 mg/dL — ABNORMAL HIGH (ref 65–99)
Potassium: 4.4 mmol/L (ref 3.5–5.2)
Sodium: 136 mmol/L (ref 134–144)
Total Protein: 7.2 g/dL (ref 6.0–8.5)

## 2020-09-02 LAB — HEMOGLOBIN A1C
Est. average glucose Bld gHb Est-mCnc: 163 mg/dL
Hgb A1c MFr Bld: 7.3 % — ABNORMAL HIGH (ref 4.8–5.6)

## 2020-09-05 NOTE — Telephone Encounter (Signed)
A message was sent to [facility] to look into the referral.

## 2020-09-12 ENCOUNTER — Encounter: Payer: Self-pay | Admitting: Radiology

## 2020-09-20 ENCOUNTER — Inpatient Hospital Stay: Payer: Medicare Other

## 2020-09-20 ENCOUNTER — Other Ambulatory Visit: Payer: Self-pay

## 2020-09-20 ENCOUNTER — Inpatient Hospital Stay: Payer: Medicare Other | Attending: Oncology | Admitting: Nurse Practitioner

## 2020-09-20 ENCOUNTER — Encounter: Payer: Self-pay | Admitting: Nurse Practitioner

## 2020-09-20 VITALS — BP 141/78 | HR 92 | Temp 97.7°F | Resp 16 | Ht 69.0 in | Wt 186.0 lb

## 2020-09-20 DIAGNOSIS — C220 Liver cell carcinoma: Secondary | ICD-10-CM | POA: Insufficient documentation

## 2020-09-20 DIAGNOSIS — Z79899 Other long term (current) drug therapy: Secondary | ICD-10-CM | POA: Diagnosis not present

## 2020-09-20 DIAGNOSIS — D500 Iron deficiency anemia secondary to blood loss (chronic): Secondary | ICD-10-CM | POA: Diagnosis not present

## 2020-09-20 DIAGNOSIS — R5383 Other fatigue: Secondary | ICD-10-CM | POA: Insufficient documentation

## 2020-09-20 DIAGNOSIS — K7460 Unspecified cirrhosis of liver: Secondary | ICD-10-CM | POA: Insufficient documentation

## 2020-09-20 DIAGNOSIS — B1910 Unspecified viral hepatitis B without hepatic coma: Secondary | ICD-10-CM | POA: Diagnosis not present

## 2020-09-20 DIAGNOSIS — D61818 Other pancytopenia: Secondary | ICD-10-CM | POA: Insufficient documentation

## 2020-09-20 DIAGNOSIS — D649 Anemia, unspecified: Secondary | ICD-10-CM | POA: Diagnosis not present

## 2020-09-20 DIAGNOSIS — E119 Type 2 diabetes mellitus without complications: Secondary | ICD-10-CM | POA: Insufficient documentation

## 2020-09-20 DIAGNOSIS — K922 Gastrointestinal hemorrhage, unspecified: Secondary | ICD-10-CM | POA: Diagnosis not present

## 2020-09-20 LAB — FERRITIN: Ferritin: 10 ng/mL — ABNORMAL LOW (ref 24–336)

## 2020-09-20 LAB — CBC WITH DIFFERENTIAL (CANCER CENTER ONLY)
Abs Immature Granulocytes: 0.01 10*3/uL (ref 0.00–0.07)
Basophils Absolute: 0 10*3/uL (ref 0.0–0.1)
Basophils Relative: 1 %
Eosinophils Absolute: 0.1 10*3/uL (ref 0.0–0.5)
Eosinophils Relative: 2 %
HCT: 25.4 % — ABNORMAL LOW (ref 39.0–52.0)
Hemoglobin: 7.5 g/dL — ABNORMAL LOW (ref 13.0–17.0)
Immature Granulocytes: 0 %
Lymphocytes Relative: 21 %
Lymphs Abs: 0.6 10*3/uL — ABNORMAL LOW (ref 0.7–4.0)
MCH: 25.6 pg — ABNORMAL LOW (ref 26.0–34.0)
MCHC: 29.5 g/dL — ABNORMAL LOW (ref 30.0–36.0)
MCV: 86.7 fL (ref 80.0–100.0)
Monocytes Absolute: 0.2 10*3/uL (ref 0.1–1.0)
Monocytes Relative: 8 %
Neutro Abs: 2 10*3/uL (ref 1.7–7.7)
Neutrophils Relative %: 68 %
Platelet Count: 70 10*3/uL — ABNORMAL LOW (ref 150–400)
RBC: 2.93 MIL/uL — ABNORMAL LOW (ref 4.22–5.81)
RDW: 14.3 % (ref 11.5–15.5)
WBC Count: 2.9 10*3/uL — ABNORMAL LOW (ref 4.0–10.5)
nRBC: 0 % (ref 0.0–0.2)

## 2020-09-20 LAB — PREPARE RBC (CROSSMATCH)

## 2020-09-20 NOTE — Progress Notes (Signed)
[place] OFFICE PROGRESS NOTE   Diagnosis:  Hepatocellular carcinoma  INTERVAL HISTORY:   [name] returns as scheduled.  He reports being more fatigued recently.  No shortness of breath.  He denies bleeding.  He continues oral iron.  Bowels moving regularly. Objective:  Vital signs in last 24 hours:  Blood pressure (!) 141/78, pulse 92, temperature 97.7 F (36.5 C), temperature source Tympanic, resp. rate 16, height 5\' 9"  (1.753 m), weight 186 lb (84.4 kg), SpO2 100 %.    Lymphatics: No palpable cervical supraclavicular or axillary lymph nodes. Resp: Lungs clear bilaterally. Cardio: Regular rate and rhythm. GI: Abdomen soft and nontender.  No hepatosplenomegaly. Vascular: No leg edema.    Lab Results:  Lab Results  Component Value Date   WBC 2.9 (L) 09/20/2020   HGB 7.5 (L) 09/20/2020   HCT 25.4 (L) 09/20/2020   MCV 86.7 09/20/2020   PLT 70 (L) 09/20/2020   NEUTROABS 2.0 09/20/2020    Imaging:  No results found.  Medications: I have reviewed the patient's current medications.  Assessment/Plan: 1. Hepatocellular carcinoma, stage I (T1 NX) status post partial left hepatectomy 03/02/2013.  08/31/2013 AFP 2.4.   CT abdomen 08/31/2013 with postoperative changes of partial hepatectomy with small postoperative fluid collection along the resection margin. No definite signs to suggest residual or locally recurrent disease. Several borderline enlarged and minimally enlarged lymph nodes superior to the liver in the juxtapericardiac fat of the lower anterior mediastinum with the largest measuring 11 mm slightly increased compared to the prior study 01/15/2013. Several prominent non-pathologically enlarged upper abdominal ligament lymph nodes similar to the prior study. Small esophageal varices.   CT abdomen 03/03/2012 without evidence of recurrent hepatocellular carcinoma, stable right lung base nodule  CT the abdomen and pelvis 09/06/2014 without  evidence of recurrent hepatocellular carcinoma, slight enlargement of pre-cardiac lymph nodes  CT abdomen/pelvis 09/07/2015 with no findings for recurrent tumor or metastatic disease. Stable small 3 mm right middle lobe pulmonary nodule since 2014. Epicardial lymph node measured 11 mm, previously 13 mm. Stable cirrhotic changes. Fairly significant esophageal varices.  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 Cirrhosis. 3. Hepatitis B core antibody positive. 4. Diabetes. 5. Severe microcytic anemia-likely iron deficiency anemia-progressive 06/03/2017. Stool Hemoccult positive 3 06/05/2017.  6. Colonoscopy 12/27/2016-external and internal hemorrhoids. Diverticulosis in the sigmoid, descending and transverse colon. One medium polyp in the distal descending colon. 3 small polyps in the transverse colon. 2 diminutive polyps in the rectum and the proximal ascending colon. Medium-sized lipoma transverse colon. Small lipoma mid ascending colon. Multiplenonbleeding colonic angiodysplastic lesions. Pathology-tubular adenomas, hyperplastic polyps. 7. Pancytopenia secondary to cirrhosis and GI bleeding 8. Laparoscopic cholecystectomy 01/28/2018 9. Decreased iron stores 10/15/2018; transfused 2 units of blood 10/18/2018 and 06/01/2020; Feraheme 11/04/2018 and 11/11/2018;Feraheme 02/18/2019 and 02/25/2019, 02/15/2020, 02/29/2020, 06/10/2020, 06/17/2020     Disposition: [name] remains in  clinical remission from hepatocellular carcinoma.  We will follow-up on the AFP from today.  CBC from today shows progressive anemia which is secondary to chronic GI blood loss in the setting of advanced cirrhosis with esophageal varices.  He is symptomatic.  We are making arrangements  for a blood transfusion.  Ferritin is low.  Plan for IV iron as well.  He will return for lab and follow-up in 4 weeks.    Ned Card ANP/GNP-BC   09/20/2020  9:14 AM

## 2020-09-21 ENCOUNTER — Other Ambulatory Visit: Payer: Self-pay | Admitting: Nurse Practitioner

## 2020-09-21 ENCOUNTER — Inpatient Hospital Stay: Payer: Medicare Other

## 2020-09-21 VITALS — BP 128/68 | HR 72 | Temp 98.0°F | Resp 16

## 2020-09-21 DIAGNOSIS — K7460 Unspecified cirrhosis of liver: Secondary | ICD-10-CM | POA: Diagnosis not present

## 2020-09-21 DIAGNOSIS — Z79899 Other long term (current) drug therapy: Secondary | ICD-10-CM | POA: Diagnosis not present

## 2020-09-21 DIAGNOSIS — D649 Anemia, unspecified: Secondary | ICD-10-CM | POA: Diagnosis not present

## 2020-09-21 DIAGNOSIS — D61818 Other pancytopenia: Secondary | ICD-10-CM | POA: Diagnosis not present

## 2020-09-21 DIAGNOSIS — C220 Liver cell carcinoma: Secondary | ICD-10-CM

## 2020-09-21 DIAGNOSIS — E119 Type 2 diabetes mellitus without complications: Secondary | ICD-10-CM | POA: Diagnosis not present

## 2020-09-21 DIAGNOSIS — K922 Gastrointestinal hemorrhage, unspecified: Secondary | ICD-10-CM | POA: Diagnosis not present

## 2020-09-21 DIAGNOSIS — D500 Iron deficiency anemia secondary to blood loss (chronic): Secondary | ICD-10-CM

## 2020-09-21 DIAGNOSIS — R5383 Other fatigue: Secondary | ICD-10-CM | POA: Diagnosis not present

## 2020-09-21 LAB — AFP TUMOR MARKER: AFP, Serum, Tumor Marker: 1.9 ng/mL (ref 0.0–8.3)

## 2020-09-21 MED ORDER — SODIUM CHLORIDE 0.9 % IV SOLN
510.0000 mg | Freq: Once | INTRAVENOUS | Status: AC
  Administered 2020-09-21: 510 mg via INTRAVENOUS
  Filled 2020-09-21: qty 17

## 2020-09-21 MED ORDER — SODIUM CHLORIDE 0.9% IV SOLUTION
250.0000 mL | Freq: Once | INTRAVENOUS | Status: AC
  Administered 2020-09-21: 250 mL via INTRAVENOUS
  Filled 2020-09-21: qty 250

## 2020-09-21 NOTE — Patient Instructions (Addendum)
Blood Transfusion, Adult, Care After This sheet gives you information about how to care for yourself after your procedure. Your doctor may also give you more specific instructions. If you have problems or questions, contact your doctor. What can I expect after the procedure? After the procedure, it is common to have:  Bruising and soreness at the IV site.  A fever or chills on the day of the procedure. This may be your body's response to the new blood cells received.  A headache. Follow these instructions at home: Insertion site care  Follow instructions from your doctor about how to take care of your insertion site. This is where an IV tube was put into your vein. Make sure you: ? Wash your hands with soap and water before and after you change your bandage (dressing). If you cannot use soap and water, use hand sanitizer. ? Change your bandage as told by your doctor.  Check your insertion site every day for signs of infection. Check for: ? Redness, swelling, or pain. ? Bleeding from the site. ? Warmth. ? Pus or a bad smell.      General instructions  Take over-the-counter and prescription medicines only as told by your doctor.  Rest as told by your doctor.  Go back to your normal activities as told by your doctor.  Keep all follow-up visits as told by your doctor. This is important. Contact a doctor if:  You have itching or red, swollen areas of skin (hives).  You feel worried or nervous (anxious).  You feel weak after doing your normal activities.  You have redness, swelling, warmth, or pain around the insertion site.  You have blood coming from the insertion site, and the blood does not stop with pressure.  You have pus or a bad smell coming from the insertion site. Get help right away if:  You have signs of a serious reaction. This may be coming from an allergy or the body's defense system (immune system). Signs include: ? Trouble breathing or shortness of  breath. ? Swelling of the face or feeling warm (flushed). ? Fever or chills. ? Head, chest, or back pain. ? Dark pee (urine) or blood in the pee. ? Widespread rash. ? Fast heartbeat. ? Feeling dizzy or light-headed. You may receive your blood transfusion in an outpatient setting. If so, you will be told whom to contact to report any reactions. These symptoms may be an emergency. Do not wait to see if the symptoms will go away. Get medical help right away. Call your local emergency services (911 in the [place] Do not drive yourself to the hospital. Summary  Bruising and soreness at the IV site are common.  Check your insertion site every day for signs of infection.  Rest as told by your doctor. Go back to your normal activities as told by your doctor.  Get help right away if you have signs of a serious reaction. This information is not intended to replace advice given to you by your health care provider. Make sure you discuss any questions you have with your health care provider. Document Revised: 01/29/2019 Document Reviewed: 01/29/2019 Elsevier Patient Education  2021 Elsevier Inc.  Ferumoxytol injection What is this medicine? FERUMOXYTOL is an iron complex. Iron is used to make healthy red blood cells, which carry oxygen and nutrients throughout the body. This medicine is used to treat iron deficiency anemia. This medicine may be used for other purposes; ask your health care provider or pharmacist if you   have questions. COMMON BRAND NAME(S): Feraheme What should I tell my health care provider before I take this medicine? They need to know if you have any of these conditions:  anemia not caused by low iron levels  high levels of iron in the blood  magnetic resonance imaging (MRI) test scheduled  an unusual or allergic reaction to iron, other medicines, foods, dyes, or preservatives  pregnant or trying to get pregnant  breast-feeding How should I use this medicine? This  medicine is for injection into a vein. It is given by a health care professional in a hospital or clinic setting. Talk to your pediatrician regarding the use of this medicine in children. Special care may be needed. Overdosage: If you think you have taken too much of this medicine contact a poison control center or emergency room at once. NOTE: This medicine is only for you. Do not share this medicine with others. What if I miss a dose? It is important not to miss your dose. Call your doctor or health care professional if you are unable to keep an appointment. What may interact with this medicine? This medicine may interact with the following medications:  other iron products This list may not describe all possible interactions. Give your health care provider a list of all the medicines, herbs, non-prescription drugs, or dietary supplements you use. Also tell them if you smoke, drink alcohol, or use illegal drugs. Some items may interact with your medicine. What should I watch for while using this medicine? Visit your doctor or healthcare professional regularly. Tell your doctor or healthcare professional if your symptoms do not start to get better or if they get worse. You may need blood work done while you are taking this medicine. You may need to follow a special diet. Talk to your doctor. Foods that contain iron include: whole grains/cereals, dried fruits, beans, or peas, leafy green vegetables, and organ meats (liver, kidney). What side effects may I notice from receiving this medicine? Side effects that you should report to your doctor or health care professional as soon as possible:  allergic reactions like skin rash, itching or hives, swelling of the face, lips, or tongue  breathing problems  changes in blood pressure  feeling faint or lightheaded, falls  fever or chills  flushing, sweating, or hot feelings  swelling of the ankles or feet Side effects that usually do not require  medical attention (report to your doctor or health care professional if they continue or are bothersome):  diarrhea  headache  nausea, vomiting  stomach pain This list may not describe all possible side effects. Call your doctor for medical advice about side effects. You may report side effects to FDA at 1-800-FDA-1088. Where should I keep my medicine? This drug is given in a hospital or clinic and will not be stored at home. NOTE: This sheet is a summary. It may not cover all possible information. If you have questions about this medicine, talk to your doctor, pharmacist, or health care provider.  2021 Elsevier/Gold Standard (2016-09-24 20:21:10)   

## 2020-09-21 NOTE — Progress Notes (Signed)
Pt discharged in no apparent distress. Pt left ambulatory without assistance.  Pt aware of discharge instructions and verbalized understanding and had no further questions. Pt waited full 30 minute observation period for his Iron infusion with no issues

## 2020-09-22 ENCOUNTER — Telehealth: Payer: Self-pay | Admitting: Nurse Practitioner

## 2020-09-22 LAB — TYPE AND SCREEN
ABO/RH(D): B POS
Antibody Screen: POSITIVE
Donor AG Type: NEGATIVE
Unit division: 0

## 2020-09-22 LAB — BPAM RBC
Blood Product Expiration Date: 202202182359
ISSUE DATE / TIME: 202202020814
Unit Type and Rh: 5100

## 2020-09-22 NOTE — Telephone Encounter (Signed)
Scheduled appointments per 2/1 los. Attempted to call patient, no answer and voicemail was full. Will have updated calendar printed for patient when he comes in tomorrow for his iron infusion.

## 2020-09-23 ENCOUNTER — Ambulatory Visit: Payer: Medicare Other | Admitting: Family Medicine

## 2020-09-26 ENCOUNTER — Ambulatory Visit: Payer: Medicare Other

## 2020-09-26 ENCOUNTER — Encounter: Payer: Self-pay | Admitting: Family Medicine

## 2020-09-28 ENCOUNTER — Inpatient Hospital Stay: Payer: Medicare Other

## 2020-09-28 ENCOUNTER — Other Ambulatory Visit: Payer: Self-pay

## 2020-09-28 VITALS — BP 145/66 | HR 78 | Temp 98.4°F | Resp 18

## 2020-09-28 DIAGNOSIS — R5383 Other fatigue: Secondary | ICD-10-CM | POA: Diagnosis not present

## 2020-09-28 DIAGNOSIS — D61818 Other pancytopenia: Secondary | ICD-10-CM | POA: Diagnosis not present

## 2020-09-28 DIAGNOSIS — D500 Iron deficiency anemia secondary to blood loss (chronic): Secondary | ICD-10-CM

## 2020-09-28 DIAGNOSIS — K7460 Unspecified cirrhosis of liver: Secondary | ICD-10-CM | POA: Diagnosis not present

## 2020-09-28 DIAGNOSIS — K922 Gastrointestinal hemorrhage, unspecified: Secondary | ICD-10-CM | POA: Diagnosis not present

## 2020-09-28 DIAGNOSIS — D649 Anemia, unspecified: Secondary | ICD-10-CM | POA: Diagnosis not present

## 2020-09-28 DIAGNOSIS — C220 Liver cell carcinoma: Secondary | ICD-10-CM | POA: Diagnosis not present

## 2020-09-28 DIAGNOSIS — Z79899 Other long term (current) drug therapy: Secondary | ICD-10-CM | POA: Diagnosis not present

## 2020-09-28 DIAGNOSIS — E119 Type 2 diabetes mellitus without complications: Secondary | ICD-10-CM | POA: Diagnosis not present

## 2020-09-28 MED ORDER — SODIUM CHLORIDE 0.9 % IV SOLN
510.0000 mg | Freq: Once | INTRAVENOUS | Status: AC
  Administered 2020-09-28: 510 mg via INTRAVENOUS
  Filled 2020-09-28: qty 510

## 2020-09-28 MED ORDER — SODIUM CHLORIDE 0.9 % IV SOLN
Freq: Once | INTRAVENOUS | Status: AC
  Filled 2020-09-28: qty 250

## 2020-09-28 NOTE — Patient Instructions (Signed)

## 2020-09-30 ENCOUNTER — Other Ambulatory Visit: Payer: Self-pay | Admitting: Nurse Practitioner

## 2020-09-30 ENCOUNTER — Other Ambulatory Visit: Payer: Self-pay

## 2020-09-30 ENCOUNTER — Inpatient Hospital Stay: Payer: Medicare Other

## 2020-09-30 VITALS — BP 128/73 | HR 95 | Temp 97.8°F | Resp 18

## 2020-09-30 DIAGNOSIS — K7460 Unspecified cirrhosis of liver: Secondary | ICD-10-CM | POA: Diagnosis not present

## 2020-09-30 DIAGNOSIS — D500 Iron deficiency anemia secondary to blood loss (chronic): Secondary | ICD-10-CM

## 2020-09-30 DIAGNOSIS — K922 Gastrointestinal hemorrhage, unspecified: Secondary | ICD-10-CM | POA: Diagnosis not present

## 2020-09-30 DIAGNOSIS — C220 Liver cell carcinoma: Secondary | ICD-10-CM | POA: Diagnosis not present

## 2020-09-30 DIAGNOSIS — E119 Type 2 diabetes mellitus without complications: Secondary | ICD-10-CM | POA: Diagnosis not present

## 2020-09-30 DIAGNOSIS — D649 Anemia, unspecified: Secondary | ICD-10-CM | POA: Diagnosis not present

## 2020-09-30 DIAGNOSIS — Z79899 Other long term (current) drug therapy: Secondary | ICD-10-CM | POA: Diagnosis not present

## 2020-09-30 DIAGNOSIS — D61818 Other pancytopenia: Secondary | ICD-10-CM | POA: Diagnosis not present

## 2020-09-30 DIAGNOSIS — R5383 Other fatigue: Secondary | ICD-10-CM | POA: Diagnosis not present

## 2020-09-30 MED ORDER — SODIUM CHLORIDE 0.9 % IV SOLN
Freq: Once | INTRAVENOUS | Status: AC
  Filled 2020-09-30: qty 250

## 2020-09-30 MED ORDER — SODIUM CHLORIDE 0.9 % IV SOLN: 510.0000 mg | Freq: Once | INTRAVENOUS | Status: DC

## 2020-09-30 NOTE — Progress Notes (Signed)
Per Ned Card, NP - patient will no longer require appointment for feraheme infusion. Orders removed and patient's IV de-accessed. Patient discharged in stable condition and given updated list of appointments.

## 2020-10-03 ENCOUNTER — Ambulatory Visit: Payer: Medicare Other

## 2020-10-03 ENCOUNTER — Ambulatory Visit: Payer: Medicare Other | Admitting: Family Medicine

## 2020-10-05 ENCOUNTER — Emergency Department (HOSPITAL_COMMUNITY): Payer: Medicare Other

## 2020-10-05 ENCOUNTER — Encounter: Payer: Self-pay | Admitting: Family Medicine

## 2020-10-05 ENCOUNTER — Emergency Department (HOSPITAL_COMMUNITY)
Admission: EM | Admit: 2020-10-05 | Discharge: 2020-10-07 | Disposition: A | Discharge status: Home / Self Care | Payer: Medicare Other | Attending: Emergency Medicine | Admitting: Emergency Medicine

## 2020-10-05 ENCOUNTER — Encounter (HOSPITAL_COMMUNITY): Payer: Self-pay

## 2020-10-05 ENCOUNTER — Ambulatory Visit (INDEPENDENT_AMBULATORY_CARE_PROVIDER_SITE_OTHER): Payer: Medicare Other | Admitting: Family Medicine

## 2020-10-05 ENCOUNTER — Other Ambulatory Visit: Payer: Self-pay

## 2020-10-05 VITALS — BP 156/74 | HR 84 | Temp 97.8°F | Ht 69.0 in | Wt 185.0 lb

## 2020-10-05 DIAGNOSIS — Z8505 Personal history of malignant neoplasm of liver: Secondary | ICD-10-CM | POA: Diagnosis not present

## 2020-10-05 DIAGNOSIS — E1165 Type 2 diabetes mellitus with hyperglycemia: Secondary | ICD-10-CM

## 2020-10-05 DIAGNOSIS — Z20822 Contact with and (suspected) exposure to covid-19: Secondary | ICD-10-CM | POA: Diagnosis not present

## 2020-10-05 DIAGNOSIS — R410 Disorientation, unspecified: Secondary | ICD-10-CM | POA: Diagnosis not present

## 2020-10-05 DIAGNOSIS — Z7984 Long term (current) use of oral hypoglycemic drugs: Secondary | ICD-10-CM | POA: Insufficient documentation

## 2020-10-05 DIAGNOSIS — Z87891 Personal history of nicotine dependence: Secondary | ICD-10-CM | POA: Insufficient documentation

## 2020-10-05 DIAGNOSIS — F0390 Unspecified dementia without behavioral disturbance: Secondary | ICD-10-CM | POA: Insufficient documentation

## 2020-10-05 DIAGNOSIS — F29 Unspecified psychosis not due to a substance or known physiological condition: Secondary | ICD-10-CM

## 2020-10-05 DIAGNOSIS — R440 Auditory hallucinations: Secondary | ICD-10-CM | POA: Insufficient documentation

## 2020-10-05 DIAGNOSIS — I6782 Cerebral ischemia: Secondary | ICD-10-CM | POA: Diagnosis not present

## 2020-10-05 DIAGNOSIS — R441 Visual hallucinations: Secondary | ICD-10-CM

## 2020-10-05 DIAGNOSIS — E119 Type 2 diabetes mellitus without complications: Secondary | ICD-10-CM | POA: Insufficient documentation

## 2020-10-05 DIAGNOSIS — R4182 Altered mental status, unspecified: Secondary | ICD-10-CM | POA: Insufficient documentation

## 2020-10-05 DIAGNOSIS — R5383 Other fatigue: Secondary | ICD-10-CM

## 2020-10-05 DIAGNOSIS — I10 Essential (primary) hypertension: Secondary | ICD-10-CM

## 2020-10-05 DIAGNOSIS — G3184 Mild cognitive impairment, so stated: Secondary | ICD-10-CM | POA: Diagnosis not present

## 2020-10-05 DIAGNOSIS — R0602 Shortness of breath: Secondary | ICD-10-CM | POA: Diagnosis not present

## 2020-10-05 DIAGNOSIS — Z79899 Other long term (current) drug therapy: Secondary | ICD-10-CM | POA: Diagnosis not present

## 2020-10-05 LAB — CBC WITH DIFFERENTIAL/PLATELET
Abs Immature Granulocytes: 0.01 10*3/uL (ref 0.00–0.07)
Basophils Absolute: 0 10*3/uL (ref 0.0–0.1)
Basophils Relative: 1 %
Eosinophils Absolute: 0.1 10*3/uL (ref 0.0–0.5)
Eosinophils Relative: 3 %
HCT: 33.2 % — ABNORMAL LOW (ref 39.0–52.0)
Hemoglobin: 10.1 g/dL — ABNORMAL LOW (ref 13.0–17.0)
Immature Granulocytes: 0 %
Lymphocytes Relative: 13 %
Lymphs Abs: 0.4 10*3/uL — ABNORMAL LOW (ref 0.7–4.0)
MCH: 28.1 pg (ref 26.0–34.0)
MCHC: 30.4 g/dL (ref 30.0–36.0)
MCV: 92.2 fL (ref 80.0–100.0)
Monocytes Absolute: 0.4 10*3/uL (ref 0.1–1.0)
Monocytes Relative: 12 %
Neutro Abs: 2.3 10*3/uL (ref 1.7–7.7)
Neutrophils Relative %: 71 %
Platelets: 84 10*3/uL — ABNORMAL LOW (ref 150–400)
RBC: 3.6 MIL/uL — ABNORMAL LOW (ref 4.22–5.81)
RDW: 20 % — ABNORMAL HIGH (ref 11.5–15.5)
WBC: 3.2 10*3/uL — ABNORMAL LOW (ref 4.0–10.5)
nRBC: 0 % (ref 0.0–0.2)

## 2020-10-05 LAB — URINALYSIS, ROUTINE W REFLEX MICROSCOPIC
Bilirubin Urine: NEGATIVE
Glucose, UA: >=500 mg/dL — AB
Ketones, ur: 5 mg/dL — AB
Leukocytes,Ua: NEGATIVE
Nitrite: NEGATIVE
Protein, ur: 30 mg/dL — AB
Specific Gravity, Urine: 1.024 (ref 1.005–1.030)
pH: 5 (ref 5.0–8.0)

## 2020-10-05 LAB — COMPREHENSIVE METABOLIC PANEL
ALT: 95 U/L — ABNORMAL HIGH (ref 0–44)
AST: 142 U/L — ABNORMAL HIGH (ref 15–41)
Albumin: 3.4 g/dL — ABNORMAL LOW (ref 3.5–5.0)
Alkaline Phosphatase: 103 U/L (ref 38–126)
Anion gap: 10 (ref 5–15)
BUN: 11 mg/dL (ref 8–23)
CO2: 22 mmol/L (ref 22–32)
Calcium: 9.2 mg/dL (ref 8.9–10.3)
Chloride: 108 mmol/L (ref 98–111)
Creatinine, Ser: 0.96 mg/dL (ref 0.61–1.24)
GFR, Estimated: >60 mL/min (ref >60)
Glucose, Bld: 176 mg/dL — ABNORMAL HIGH (ref 70–99)
Potassium: 3.8 mmol/L (ref 3.5–5.1)
Sodium: 140 mmol/L (ref 135–145)
Total Bilirubin: 1.9 mg/dL — ABNORMAL HIGH (ref 0.3–1.2)
Total Protein: 7.4 g/dL (ref 6.5–8.1)

## 2020-10-05 LAB — RAPID URINE DRUG SCREEN, HOSP PERFORMED
Amphetamines: POSITIVE — AB
Barbiturates: NOT DETECTED
Benzodiazepines: NOT DETECTED
Cocaine: NOT DETECTED
Opiates: NOT DETECTED
Tetrahydrocannabinol: NOT DETECTED

## 2020-10-05 LAB — RESP PANEL BY RT-PCR (FLU A&B, COVID) ARPGX2
Influenza A by PCR: NEGATIVE
Influenza B by PCR: NEGATIVE
SARS Coronavirus 2 by RT PCR: NEGATIVE

## 2020-10-05 LAB — ETHANOL: Alcohol, Ethyl (B): <10 mg/dL (ref <10)

## 2020-10-05 MED ORDER — GLIPIZIDE 5 MG PO TABS
5.0000 mg | ORAL_TABLET | Freq: Every day | ORAL | Status: DC
  Administered 2020-10-07: 5 mg via ORAL
  Filled 2020-10-05: qty 1

## 2020-10-05 MED ORDER — PRAVASTATIN SODIUM 20 MG PO TABS
20.0000 mg | ORAL_TABLET | Freq: Every day | ORAL | Status: DC
  Administered 2020-10-05 – 2020-10-06 (×2): 20 mg via ORAL
  Filled 2020-10-05 (×2): qty 1

## 2020-10-05 MED ORDER — METFORMIN HCL 500 MG PO TABS
500.0000 mg | ORAL_TABLET | Freq: Two times a day (BID) | ORAL | Status: DC
  Administered 2020-10-06 – 2020-10-07 (×3): 500 mg via ORAL
  Filled 2020-10-05 (×3): qty 1

## 2020-10-05 MED ORDER — DONEPEZIL HCL 5 MG PO TABS
10.0000 mg | ORAL_TABLET | Freq: Every day | ORAL | Status: DC
  Administered 2020-10-05 – 2020-10-06 (×2): 10 mg via ORAL
  Filled 2020-10-05 (×2): qty 2

## 2020-10-05 MED ORDER — SPIRONOLACTONE 25 MG PO TABS
25.0000 mg | ORAL_TABLET | Freq: Every day | ORAL | Status: DC
  Administered 2020-10-07: 25 mg via ORAL
  Filled 2020-10-05: qty 1

## 2020-10-05 NOTE — ED Provider Notes (Signed)
[facility] DEPT Provider Note   CSN: [id number] Arrival date & time: 10/05/20  1804     History Chief Complaint  Patient presents with  . Altered Mental Status    [name] is a 75 y.o. male.  Patient has dementia.  According to the son the patient has been having visual and auditory hallucinations recently.  He has never had a before  The history is provided by the patient and a relative.  Altered Mental Status Presenting symptoms: behavior changes   Severity:  Mild Most recent episode:  More than 2 days ago Episode history:  Continuous Timing:  Constant Progression:  Unchanged Chronicity:  New Context: dementia   Associated symptoms: hallucinations   Associated symptoms: no abdominal pain, no headaches, no rash and no seizures        Past Medical History:  Diagnosis Date  . Cancer [facility]    hepatocellular cancer   . Diabetes mellitus without complication ([place])   . Dyspnea   . Hepatitis B   . Hyperlipidemia   . Hypertension   . Irregular heart beat 01/28/2018  . Narcolepsy    per office visit note of 08/2011   . Sleep apnea    cpap    Patient Active Problem List   Diagnosis Date Noted  . Iron deficiency anemia 11/04/2018  . Orthostatic hypotension 08/06/2018  . Hypertension 08/06/2018  . Hyperlipidemia 08/06/2018  . Pancytopenia ([place]) 08/06/2018  . 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 Insufficient treatment with nasal CPAP 05/05/2018  . 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 Irregular heart beat 01/28/2018  . Noncompliance with CPAP treatment 08/14/2017  . Excessive daytime sleepiness 04/09/2017  . Snoring 04/09/2017  . Memory impairment of gradual onset 04/09/2017  . Aortic atherosclerosis ([place]) 11/27/2016  . 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 Altered mental status 03/05/2013  . Respiratory failure,  post-operative ([place]) 03/02/2013  . Hepatitis B 02/24/2013  . Cancer, hepatocellular ([place]) 02/10/2013  . Liver tumor-bleeding 01/15/2013  . Epidermal cyst 06/18/2012  . OSA (obstructive sleep apnea) 03/13/2012  . Abnormal leg movement 12/12/2011  . Blood pressure elevated 09/06/2011  . Diabetes mellitus ([place]) 09/06/2011  . 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 BPH (benign prostatic hyperplasia) 09/06/2011  . Diverticula of colon 09/06/2011    Past Surgical History:  Procedure Laterality Date  . CHOLECYSTECTOMY N/A 01/28/2018   Procedure: LAPAROSCOPIC CHOLECYSTECTOMY;  Surgeon: [name], MD;  Location: [facility];  Service: General;  Laterality: N/A;  . COLONOSCOPY WITH PROPOFOL N/A 08/27/2017   Procedure: COLONOSCOPY WITH PROPOFOL;  Surgeon: Clarene Essex, MD;  Location: [facility];  Service: Endoscopy;  Laterality: N/A;  . ESOPHAGOGASTRODUODENOSCOPY (EGD) WITH PROPOFOL N/A 02/23/2020   Procedure: ESOPHAGOGASTRODUODENOSCOPY (EGD) WITH PROPOFOL;  Surgeon: Clarene Essex, MD;  Location: [facility];  Service: Endoscopy;  Laterality: N/A;  . HOT HEMOSTASIS N/A 08/27/2017   Procedure: HOT HEMOSTASIS (ARGON PLASMA COAGULATION/BICAP);  Surgeon: Clarene Essex, MD;  Location: [facility];  Service: Endoscopy;  Laterality: N/A;  . LAPAROSCOPIC CHOLECYSTECTOMY  01/28/2018  . LAPAROSCOPY  03/02/2013   Procedure: LAPAROSCOPY DIAGNOSTIC;  Surgeon: [name], MD;  Location: [facility];  Service: General;;  . LIVER ULTRASOUND  03/02/2013   Procedure: LIVER ULTRASOUND;  Surgeon: [name], MD;  Location: [facility];  Service: General;;  . OPEN PARTIAL HEPATECTOMY   03/02/2013   Procedure: OPEN PARTIAL HEPATECTOMY [83];  Surgeon: [name], MD;  Location: [facility];  Service: General;;  DX LAPAROSCOPY, INTRAOPERATIVE LIVER ULTRASOUND, OPEN PARTIAL HEPATECTOMY  . pinched nerve in back         Family History  Problem Relation Age of Onset  . Dementia Mother   . Cancer Father   . Diabetes Brother   . Hypertension Brother    . Cancer Brother     Social History   Tobacco Use  . Smoking status: Former Smoker    Years: 5.00    Types: Cigarettes  . Smokeless tobacco: Never Used  Vaping Use  . Vaping Use: Never used  Substance Use Topics  . Alcohol use: No  . Drug use: No    Home Medications Prior to Admission medications   Medication Sig Start Date End Date Taking? Authorizing Provider  ACCU-CHEK SOFTCLIX LANCETS lancets Once per day testing. 08/05/17   Wendie Agreste, MD  alfuzosin (UROXATRAL) 10 MG 24 hr tablet Take 1 tablet (10 mg total) by mouth at bedtime. 01/08/20   Wendie Agreste, MD  blood glucose meter kit and supplies Dispense based on patient and insurance preference. Use once per day. 07/17/19   Wendie Agreste, MD  donepezil (ARICEPT) 10 MG tablet Take 1 tablet (10 mg total) by mouth at bedtime. 06/09/20   Ward Givens, NP  feeding supplement, ENSURE ENLIVE, (ENSURE ENLIVE) LIQD Take 237 mLs by mouth 2 (two) times daily between meals. 08/08/18   Roxan Hockey, MD  ferrous sulfate (KP FERROUS SULFATE) 325 (65 FE) MG tablet Take 1 tablet (325 mg total) by mouth 3 (three) times daily with meals. 08/08/18   Roxan Hockey, MD  glipiZIDE (GLUCOTROL) 5 MG tablet Take 1 tablet (5 mg total) by mouth daily before breakfast. 01/08/20   Wendie Agreste, MD  glucose blood (ACCU-CHEK AVIVA PLUS) test strip 1 each by Other route as needed for other. Use as instructed - once per day testing for now. 07/30/18   Wendie Agreste, MD  glucose blood test strip Use as instructed 11/26/19   Wendie Agreste, MD  HYDROcodone-acetaminophen (NORCO/VICODIN) 5-325 MG tablet Take 1 tablet by mouth every 6 (six) hours as needed. 07/11/20   Ward, Delice Bison, DO  metFORMIN (GLUCOPHAGE) 500 MG tablet Take 1 tablet (500 mg total) by mouth 2 (two) times daily with a meal. 01/08/20   Wendie Agreste, MD  Misc. Devices (PILL BOX 7 DAY) MISC 1 Device by Does not apply route daily. Have assistant fill pill box with  daily meds. 11/05/19   Wendie Agreste, MD  Multiple Vitamin (MULTIVITAMIN WITH MINERALS) TABS tablet Take 1 tablet by mouth daily. Patient not taking: No sig reported 08/09/18   Roxan Hockey, MD  ondansetron (ZOFRAN ODT) 4 MG disintegrating tablet Take 1 tablet (4 mg total) by mouth every 6 (six) hours as needed. Patient not taking: No sig reported 07/11/20   Ward, Cyril Mourning N, DO  penicillin v potassium (VEETID) 500 MG tablet Take 1 tablet (500 mg total) by mouth 4 (four) times daily. Patient not taking: No sig reported 07/11/20   Ward, Cyril Mourning N, DO  pravastatin (PRAVACHOL) 20 MG tablet Take 1 tablet (20 mg total) by mouth at bedtime. Patient not taking: No sig reported 01/08/20   Wendie Agreste, MD  psyllium (METAMUCIL) 58.6 % powder Take 1 packet by mouth daily. Member purchased over-the-counter Patient not taking: No sig reported 09/22/18   [provider]  sertraline (ZOLOFT) 25 MG tablet Take 1 tablet (25 mg total) by mouth daily. Patient  not taking: No sig reported 01/08/20   Wendie Agreste, MD  spironolactone (ALDACTONE) 25 MG tablet Take 1 tablet (25 mg total) by mouth daily. 01/08/20   Wendie Agreste, MD    Allergies    Ace inhibitors  Review of Systems   Review of Systems  Constitutional: Negative for appetite change and fatigue.  HENT: Negative for congestion, ear discharge and sinus pressure.   Eyes: Negative for discharge.  Respiratory: Negative for cough.   Cardiovascular: Negative for chest pain.  Gastrointestinal: Negative for abdominal pain and diarrhea.  Genitourinary: Negative for frequency and hematuria.  Musculoskeletal: Negative for back pain.  Skin: Negative for rash.  Neurological: Negative for seizures and headaches.  Psychiatric/Behavioral: Positive for hallucinations.    Physical Exam Updated Vital Signs BP (!) 141/71   Pulse 78   Temp 98.1 F (36.7 C) (Oral)   Resp 18   SpO2 98%   Physical Exam Vitals reviewed.   Constitutional:      Appearance: He is well-developed.  HENT:     Head: Normocephalic.     Nose: Nose normal.  Eyes:     General: No scleral icterus.    Extraocular Movements: EOM normal.     Conjunctiva/sclera: Conjunctivae normal.  Neck:     Thyroid: No thyromegaly.  Cardiovascular:     Rate and Rhythm: Normal rate and regular rhythm.     Heart sounds: No murmur heard. No friction rub. No gallop.   Pulmonary:     Breath sounds: No stridor. No wheezing or rales.  Chest:     Chest wall: No tenderness.  Abdominal:     General: There is no distension.     Tenderness: There is no abdominal tenderness. There is no rebound.  Musculoskeletal:        General: No edema. Normal range of motion.     Cervical back: Neck supple.  Lymphadenopathy:     Cervical: No cervical adenopathy.  Skin:    Findings: No erythema or rash.  Neurological:     Mental Status: He is alert and oriented to person, place, and time.     Motor: No abnormal muscle tone.     Coordination: Coordination normal.  Psychiatric:        Mood and Affect: Mood and affect normal.     Comments: Auditory and visual hallucination     ED Results / Procedures / Treatments   Labs (all labs ordered are listed, but only abnormal results are displayed) Labs Reviewed  CBC WITH DIFFERENTIAL/PLATELET - Abnormal; Notable for the following components:      Result Value   WBC 3.2 (*)    RBC 3.60 (*)    Hemoglobin 10.1 (*)    HCT 33.2 (*)    RDW 20.0 (*)    Platelets 84 (*)    Lymphs Abs 0.4 (*)    All other components within normal limits  COMPREHENSIVE METABOLIC PANEL - Abnormal; Notable for the following components:   Glucose, Bld 176 (*)    Albumin 3.4 (*)    AST 142 (*)    ALT 95 (*)    Total Bilirubin 1.9 (*)    All other components within normal limits  URINALYSIS, ROUTINE W REFLEX MICROSCOPIC - Abnormal; Notable for the following components:   Color, Urine AMBER (*)    Glucose, UA >=500 (*)    Hgb urine  dipstick SMALL (*)    Ketones, ur 5 (*)    Protein, ur 30 (*)  Bacteria, UA RARE (*)    All other components within normal limits  RAPID URINE DRUG SCREEN, HOSP PERFORMED - Abnormal; Notable for the following components:   Amphetamines POSITIVE (*)    All other components within normal limits  RESP PANEL BY RT-PCR (FLU A&B, COVID) ARPGX2  ETHANOL    EKG None  Radiology CT Head Wo Contrast  Result Date: 10/05/2020 CLINICAL DATA:  Visual and auditory hallucinations.  Dementia. EXAM: CT HEAD WITHOUT CONTRAST TECHNIQUE: Contiguous axial images were obtained from the base of the skull through the vertex without intravenous contrast. COMPARISON:  Report from study 11/12/2016 FINDINGS: Brain: Mild age related volume loss. No sign of acute infarction. Mild chronic small-vessel ischemic changes of the white matter. No large vessel territory infarction. No mass, hemorrhage, hydrocephalus or extra-axial collection. Vascular: There is atherosclerotic calcification of the major vessels at the base of the brain. Skull: Negative Sinuses/Orbits: Clear/normal Other: None IMPRESSION: No acute finding by CT. Mild age related volume loss. Mild chronic small-vessel ischemic changes of the white matter. Electronically Signed   By: Nelson Chimes M.D.   On: 10/05/2020 19:50   DG Chest Port 1 View  Result Date: 10/05/2020 CLINICAL DATA:  Shortness of breath EXAM: PORTABLE CHEST 1 VIEW COMPARISON:  06/18/2018 FINDINGS: No focal consolidation or effusion. Stable cardiomediastinal silhouette with aortic atherosclerosis. No pneumothorax. IMPRESSION: No active disease. Electronically Signed   By: Donavan Foil M.D.   On: 10/05/2020 20:05    Procedures Procedures   Medications Ordered in ED Medications - No data to display  ED Course  I have reviewed the triage vital signs and the nursing notes.  Pertinent labs & imaging results that were available during my care of the patient were reviewed by me and considered  in my medical decision making (see chart for details).    MDM Rules/Calculators/A&P                          Patient is medically cleared.  Patient with dementia and auditory and visual hallucinations.  He will be seen by behavioral health Final Clinical Impression(s) / ED Diagnoses Final diagnoses:  None    Rx / DC Orders ED Discharge Orders    None       Milton Ferguson, MD 10/05/20 2232

## 2020-10-05 NOTE — ED Triage Notes (Signed)
Pts son states that pt has been experiencing visual and auditory hallucinations since Friday. Pt has hx of dementia, but has never had hallucinations. Pts son states that pt has generally been more confused then normal on top of the hallucinations. Pt is hard of hearing.

## 2020-10-05 NOTE — Patient Instructions (Addendum)
  With your recent change in symptoms I am concerned about delirium.  Please present to the emergency room tonight to evaluate your condition further as able do other testing I am unable to do here.  After you are seen in the hospital, I  am happy to see you in follow-up.   If you have lab work done today you will be contacted with your lab results within the next 2 weeks.  If you have not heard from [place] then please contact us. The fastest way to get your results is to register for My Chart.   IF you received an x-ray today, you will receive an invoice from [facility]. Please contact [facility] at [phone] with questions or concerns regarding your invoice.   IF you received labwork today, you will receive an invoice from [facility]. Please contact [facility] at [phone] with questions or concerns regarding your invoice.   Our billing staff will not be able to assist you with questions regarding bills from these companies.  You will be contacted with the lab results as soon as they are available. The fastest way to get your results is to activate your My Chart account. Instructions are located on the last page of this paperwork. If you have not heard from [place] regarding the results in 2 weeks, please contact this office.

## 2020-10-05 NOTE — Progress Notes (Signed)
Subjective:  Patient ID: [name], male    DOB: July 03, 1946  Age: 75 y.o. MRN: [id number]  CC:  Chief Complaint  Patient presents with  . Follow-up    From last OV. Pt reports not getting enough food. Pt states he canceled meals on wheels delivery's due to not liking the food. PT reports he had a blood transfusion recently.  . family concerns    Pt's brother is concerned about the pt's cognitive function. Pt's brother reports that the pt is seeing people who isn't there.  Family member is concerned about the pt living on his own. Family member is unsure it the pt is actually taking the medication on his med list.    HPI [name] presents for   Concerns as above.  Here with son [name] today. Not wearing hearing aids today, history of hearing difficulty, previous history has been gathered by writing. [name] has known [name] for years, but found out he was his son 8 months ago.   Last visit with me January 13.  A1c had improved at that time at 7.3, down from 8.0 in August 2021, but glucose was 285 in the office.  Renal function was improved at 1.10.  Albumin was stable but still slightly low at 3.5.  Followed by oncology with history of hepatocellular carcinoma, microcytic anemia, severe.  Has received Feraheme infusions -last given February 11.  Blood transfusion February 2nd-1 unit PRBC.  He has taken Aricept for mild cognitive impairment.  Last evaluated by neurology in October 2021.  MMSE 20 out of 30 at that time.  Adderall was discontinued as he had not been taking it at that time.  52-monthfollow-up planned. Per son - has noticed a change in [name]'s condition. Friday was usual self. 5 days ago - saw him in yard walking in circles. Stated there were 2 people in house. When son present, RClinestated he saw 2 people walking - few episodes of visual hallucinations. Continues to have hallucinations since that time. Walked up street last night, walking up to  neighbor's house at [employer].  No complaints of pain or new symptoms.  Not sure if he has been eating at home. Lives by self, has home health aide 1/2 day m/w/f. Unsure how he is eating on these days. Meals on wheels cancelled - did not like food. Off for past 2 months.  Cousin taking him to store for food, [facility] has cooked for him as well.  Sleeping more past 3 days when spending time with JJeneen [name] Sleeping more past few days during the day. Unsure if sleeping in the night.   Written hx: chest pain - every now and then for years. Not new.  Denies dysuria, no new pain.  No apparent change in urination frequency.     Some concern in the past regarding medication adherence.  [facility] to evaluate home situation as well as medication assistance.he has home health aide.  Social worker note December 15.  Call from patient's niece RSuanne [name]  Had list of food resources and supplemental nutrition assistance program.  Was completing the application with patient.  Edward Cruelwas selected as his financial and healthcare power of attorney.  Discussed contacting Social Security Administration regarding income questions.  Discussed the low income, energy assistance [place] provided to niece.  Information provided regarding property tax.  Case was closed at that time from sEducation officer, museumas no apparent additional social  work needs were identified at that time.    History Patient Active Problem List   Diagnosis Date Noted  . Iron deficiency anemia 11/04/2018  . Orthostatic hypotension 08/06/2018  . Hypertension 08/06/2018  . Hyperlipidemia 08/06/2018  . Pancytopenia ([place]) 08/06/2018  . 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 Insufficient treatment with nasal CPAP 05/05/2018  . 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 Irregular heart beat 01/28/2018  . Noncompliance with CPAP treatment 08/14/2017   . Excessive daytime sleepiness 04/09/2017  . Snoring 04/09/2017  . Memory impairment of gradual onset 04/09/2017  . Aortic atherosclerosis ([place]) 11/27/2016  . 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 Altered mental status 03/05/2013  . Respiratory failure, post-operative ([place]) 03/02/2013  . Hepatitis B 02/24/2013  . Cancer, hepatocellular ([place]) 02/10/2013  . Liver tumor-bleeding 01/15/2013  . Epidermal cyst 06/18/2012  . OSA (obstructive sleep apnea) 03/13/2012  . Abnormal leg movement 12/12/2011  . Blood pressure elevated 09/06/2011  . Diabetes mellitus ([place]) 09/06/2011  . 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 BPH (benign prostatic hyperplasia) 09/06/2011  . Diverticula of colon 09/06/2011   Past Medical History:  Diagnosis Date  . Cancer [name])    hepatocellular cancer   . Diabetes mellitus without complication ([place])   . Dyspnea   . Hepatitis B   . Hyperlipidemia   . Hypertension   . Irregular heart beat 01/28/2018  . Narcolepsy    per office visit note of 08/2011   . Sleep apnea    cpap   Past Surgical History:  Procedure Laterality Date  . CHOLECYSTECTOMY N/A 01/28/2018   Procedure: LAPAROSCOPIC CHOLECYSTECTOMY;  Surgeon: [name], MD;  Location: [facility];  Service: General;  Laterality: N/A;  . COLONOSCOPY WITH PROPOFOL N/A 08/27/2017   Procedure: COLONOSCOPY WITH PROPOFOL;  Surgeon: Clarene Essex, MD;  Location: [facility];  Service: Endoscopy;  Laterality: N/A;  . ESOPHAGOGASTRODUODENOSCOPY (EGD) WITH PROPOFOL N/A 02/23/2020   Procedure: ESOPHAGOGASTRODUODENOSCOPY (EGD) WITH PROPOFOL;  Surgeon: Clarene Essex, MD;  Location: [facility];  Service: Endoscopy;  Laterality: N/A;  . HOT HEMOSTASIS N/A 08/27/2017   Procedure: HOT HEMOSTASIS (ARGON PLASMA COAGULATION/BICAP);  Surgeon: Clarene Essex, MD;  Location: [facility];  Service: Endoscopy;  Laterality: N/A;  . LAPAROSCOPIC CHOLECYSTECTOMY  01/28/2018  . LAPAROSCOPY  03/02/2013   Procedure: LAPAROSCOPY  DIAGNOSTIC;  Surgeon: [name], MD;  Location: [facility];  Service: General;;  . LIVER ULTRASOUND  03/02/2013   Procedure: LIVER ULTRASOUND;  Surgeon: [name], MD;  Location: [facility];  Service: General;;  . OPEN PARTIAL HEPATECTOMY   03/02/2013   Procedure: OPEN PARTIAL HEPATECTOMY [83];  Surgeon: [name], MD;  Location: [facility];  Service: General;;  DX LAPAROSCOPY, INTRAOPERATIVE LIVER ULTRASOUND, OPEN PARTIAL HEPATECTOMY  . pinched nerve in back     Allergies  Allergen Reactions  . Ace Inhibitors Swelling and Other (See Comments)    Angioedema - face.    Prior to Admission medications   Medication Sig Start Date End Date Taking? Authorizing Provider  ACCU-CHEK SOFTCLIX LANCETS lancets Once per day testing. 08/05/17   Wendie Agreste, MD  alfuzosin (UROXATRAL) 10 MG 24 hr tablet Take 1 tablet (10 mg total) by mouth at bedtime. 01/08/20   Wendie Agreste, MD  blood glucose meter kit and supplies Dispense based on patient and insurance preference. Use once per day. 07/17/19   Wendie Agreste, MD  donepezil (ARICEPT) 10 MG tablet Take 1 tablet (10  mg total) by mouth at bedtime. 06/09/20   Ward Givens, NP  feeding supplement, ENSURE ENLIVE, (ENSURE ENLIVE) LIQD Take 237 mLs by mouth 2 (two) times daily between meals. 08/08/18   Roxan Hockey, MD  ferrous sulfate (KP FERROUS SULFATE) 325 (65 FE) MG tablet Take 1 tablet (325 mg total) by mouth 3 (three) times daily with meals. 08/08/18   Roxan Hockey, MD  glipiZIDE (GLUCOTROL) 5 MG tablet Take 1 tablet (5 mg total) by mouth daily before breakfast. 01/08/20   Wendie Agreste, MD  glucose blood (ACCU-CHEK AVIVA PLUS) test strip 1 each by Other route as needed for other. Use as instructed - once per day testing for now. 07/30/18   Wendie Agreste, MD  glucose blood test strip Use as instructed 11/26/19   Wendie Agreste, MD  HYDROcodone-acetaminophen (NORCO/VICODIN) 5-325 MG tablet Take 1 tablet by mouth every 6 (six) hours  as needed. 07/11/20   Ward, Delice Bison, DO  metFORMIN (GLUCOPHAGE) 500 MG tablet Take 1 tablet (500 mg total) by mouth 2 (two) times daily with a meal. 01/08/20   Wendie Agreste, MD  Misc. Devices (PILL BOX 7 DAY) MISC 1 Device by Does not apply route daily. Have assistant fill pill box with daily meds. 11/05/19   Wendie Agreste, MD  Multiple Vitamin (MULTIVITAMIN WITH MINERALS) TABS tablet Take 1 tablet by mouth daily. Patient not taking: No sig reported 08/09/18   Roxan Hockey, MD  ondansetron (ZOFRAN ODT) 4 MG disintegrating tablet Take 1 tablet (4 mg total) by mouth every 6 (six) hours as needed. Patient not taking: No sig reported 07/11/20   Ward, Cyril Mourning N, DO  penicillin v potassium (VEETID) 500 MG tablet Take 1 tablet (500 mg total) by mouth 4 (four) times daily. Patient not taking: No sig reported 07/11/20   Ward, Cyril Mourning N, DO  pravastatin (PRAVACHOL) 20 MG tablet Take 1 tablet (20 mg total) by mouth at bedtime. Patient not taking: No sig reported 01/08/20   Wendie Agreste, MD  psyllium (METAMUCIL) 58.6 % powder Take 1 packet by mouth daily. Member purchased over-the-counter Patient not taking: No sig reported 09/22/18   [provider]  sertraline (ZOLOFT) 25 MG tablet Take 1 tablet (25 mg total) by mouth daily. Patient not taking: No sig reported 01/08/20   Wendie Agreste, MD  spironolactone (ALDACTONE) 25 MG tablet Take 1 tablet (25 mg total) by mouth daily. 01/08/20   Wendie Agreste, MD   Social History   Socioeconomic History  . Marital status: Divorced    Spouse name: Not on file  . Number of children: 2  . Years of education: 15+  . Highest education level: Bachelor's degree (e.g., BA, AB, BS)  Occupational History  . Occupation: Retired  Tobacco Use  . Smoking status: Former Smoker    Years: 5.00    Types: Cigarettes  . Smokeless tobacco: Never Used  Vaping Use  . Vaping Use: Never used  Substance and Sexual Activity  . Alcohol use: No  . Drug  use: No  . Sexual activity: Not Currently  Other Topics Concern  . Not on file  Social History Narrative   Patient is single and lives alone- became widower when 1 child was 75 year old, divorced 2nd marriage   Has #2 grown children, not in area or involved in his life   Patient is retired.   Patient has a college education.   Patient is right-handed.   Patient drinks  maybe two sodas daily.   Social Determinants of Health   Financial Resource Strain: Low Risk   . Difficulty of Paying Living Expenses: Not hard at all  Food Insecurity: Food Insecurity Present  . Worried About Charity fundraiser in the Last Year: Sometimes true  . Ran Out of Food in the Last Year: Sometimes true  Transportation Needs: No Transportation Needs  . Lack of Transportation (Medical): No  . Lack of Transportation (Non-Medical): No  Physical Activity: Inactive  . Days of Exercise per Week: 0 days  . Minutes of Exercise per Session: 0 min  Stress: No Stress Concern Present  . Feeling of Stress : Not at all  Social Connections: Moderately Isolated  . Frequency of Communication with Friends and Family: More than three times a week  . Frequency of Social Gatherings with Friends and Family: More than three times a week  . Attends Religious Services: 1 to 4 times per year  . Active Member of Clubs or Organizations: No  . Attends Archivist Meetings: Never  . Marital Status: Divorced  Human resources officer Violence: Not At Risk  . Fear of Current or Ex-Partner: No  . Emotionally Abused: No  . Physically Abused: No  . Sexually Abused: No    Review of Systems   Objective:   Vitals:   10/05/20 1625  BP: (!) 156/74  Pulse: 84  Temp: 97.8 F (36.6 C)  TempSrc: Temporal  SpO2: 100%  Weight: 185 lb (83.9 kg)  Height: _0  (1.753 m)     Physical Exam Vitals reviewed.  Constitutional:      Appearance: He is well-developed and well-nourished.  HENT:     Head: Normocephalic and atraumatic.   Eyes:     Extraocular Movements: EOM normal.     Pupils: Pupils are equal, round, and reactive to light.  Neck:     Vascular: No carotid bruit or JVD.  Cardiovascular:     Rate and Rhythm: Normal rate and regular rhythm.     Heart sounds: Normal heart sounds. No murmur heard.   Pulmonary:     Effort: Pulmonary effort is normal.     Breath sounds: Normal breath sounds. No rales.  Musculoskeletal:        General: No edema.  Skin:    General: Skin is warm and dry.  Neurological:     Mental Status: He is alert and oriented to person, place, and time.  Psychiatric:        Mood and Affect: Mood and affect normal.     Abdomen nontender.   38 minutes spent during visit, greater than 50% counseling and assimilation of information, chart review, and discussion of plan.    Assessment & Plan:  MAKELL CYR is a 75 y.o. male . Mild cognitive impairment  Type 2 diabetes mellitus with hyperglycemia, without long-term current use of insulin (HCC)  Essential hypertension  Visual hallucination  Fatigue, unspecified type  Acute delirium  History of mild cognitive impairment treated with Aricept, some concern with medication adherence over previous visits, complicated history with iron deficiency anemia, recent transfusion with iron as well as previous RBC transfusion earlier in the month.  Concerning history of acute change in symptoms 5 days ago with visual hallucinations, increasing fatigue and daytime somnolence, wondering to neighbor's house at 1 AM this morning.  Differential includes superimposed delirium on top of dementia.  We will have him evaluated through emergency room tonight.  Potentially may benefit from social  work evaluation as well to see if different needs at home or resources given current situation. Discussed with his son who will transport him to the ER now.  No orders of the defined types were placed in this encounter.  Patient Instructions       If  you have lab work done today you will be contacted with your lab results within the next 2 weeks.  If you have not heard from [place] then please contact us. The fastest way to get your results is to register for My Chart.   IF you received an x-ray today, you will receive an invoice from [name]. Please contact [facility] at [phone] with questions or concerns regarding your invoice.   IF you received labwork today, you will receive an invoice from [facility]. Please contact [facility] at [phone] with questions or concerns regarding your invoice.   Our billing staff will not be able to assist you with questions regarding bills from these companies.  You will be contacted with the lab results as soon as they are available. The fastest way to get your results is to activate your My Chart account. Instructions are located on the last page of this paperwork. If you have not heard from [place] regarding the results in 2 weeks, please contact this office.         Signed, [name], MD [facility]

## 2020-10-06 DIAGNOSIS — F29 Unspecified psychosis not due to a substance or known physiological condition: Secondary | ICD-10-CM

## 2020-10-06 LAB — CBG MONITORING, ED: Glucose-Capillary: 159 mg/dL — ABNORMAL HIGH (ref 70–99)

## 2020-10-06 MED ORDER — QUETIAPINE FUMARATE 25 MG PO TABS
12.5000 mg | ORAL_TABLET | Freq: Every day | ORAL | Status: DC
  Administered 2020-10-06: 12.5 mg via ORAL
  Filled 2020-10-06: qty 1

## 2020-10-06 NOTE — BH Assessment (Signed)
2/17@10 :45 TTS attempted to see patient, no response from Nursing Staff

## 2020-10-06 NOTE — BH Assessment (Signed)
[place] Assessment Progress Note  Per Letitia Libra, NP, this pt requires psychiatric hospitalization at a facility providing specialty care for geriatric patients at this time.  Per EDP Gareth Morgan, MD, pt also meets criteria for IVC which she is currently initiating.  The following facilities have been contacted to seek placement for this pt, with results as noted:  Beds available, information sent, decision pending: [facility]:    Jalene Mullet, [place] Coordinator [phone]

## 2020-10-06 NOTE — ED Provider Notes (Signed)
  Physical Exam  BP 131/64 (BP Location: Left Arm)   Pulse 65   Temp 97.6 F (36.4 C) (Oral)   Resp 16   SpO2 100%   Physical Exam  ED Course/Procedures     Procedures  MDM  Received care of patient from previous providers. Presented with hallucinations on background of dementia.  CT head, labs, UA without acute findings.  Mild elevation in transaminases but no abdominal tenderness, do not suspect given clinical exam pt has choledocholithiasis or cholangitis. He was medically cleared by previous provider. He is awaiting placement.  IVC filled out per request of [facility] as he has hallucinations making him at risk to himself.        Gareth Morgan, MD 10/06/20 2306

## 2020-10-06 NOTE — BH Assessment (Signed)
Comprehensive Clinical Assessment (CCA) Note  10/06/2020 [name] [id number]   Disposition: Per Edward Libra, NP , patient meet criteria for inpatient, Gero psych recommended for patient    Pt is a 75 yo male who presents voluntarily to  [facility] via car . Pt was accompanied by his son [name] reporting symptoms of a altered mental status. Pt has a history of  Dementia and says he was referred for assessment by his son  Pt's son reports that his dad may or may not take his medication as prescribed because of his dementia. Patient was positive for Amphetamines per ED report and medication that he is no longer prescribed. Patient's son was unaware of when or that he dad even  took the medication or where it may be now.    Pt's son denies that his dad has stated that he is suicidal or homicidal . Pt denies homicidal ideation/ history of violence.   Pt reports auditory & visual hallucinations or other symptoms of psychosis. Pt son states his dad has no current stressors, Patient's so stated he was fine all week up until Saturday evening when her starting saying that people were in his home, someone stole his car which is in the driveway and saying bizarre statements. Patient's son stated he is unsure if his father takes his medication or not because of his dementia. Patient does have HHA come to the home three days a week to make sure patient, bathes, eats  and takes medications but even when the aide is there patient is usually independent with ADL's. Patients' son stated that his dad has several guns in the home and he carries a 28 in his person. Patient's son stated he removed at least 8 guns from the home and will remove the 6 while his dad is in the hospital. Patient is not a good historian because he is hard of hearing and did not have hearing aides. His son stated that his dad even with hearing aides would benefit for a face to face assessment vs telepsych.   Pt lives alone and supports  include family . Pt's son denies a hx of abuse and trauma. Pt's son denies reports there is a family history of mental health or substance use . Pt's is retired Pt  has fair insight and judgment. Pt's memory is confused and irrelevant. Patient suffers from dementia. Pt's son denies any legal history .  Protective factors against suicide include good family support, no current suicidal ideation, future orientation, and no prior attempts.  Pt's son denies OP/ IP history. Pt's son denies alcohol/ substance abuse.  MSE: Pt is dressed  In scrubs and laying in hospital bed, patient is hard of hearing alert, oriented x1 with limited  speech and normal motor behavior. Eye contact is fleeting . Pt's mood is euthymic and affect is normal . Affect is congruent with mood. Thought process is confused  and irrelevant. There is  indication Pt is currently responding to internal stimuli or experiencing delusional thought content. Pt was cooperative  but unable to hear TTS throughout assessment due to not having on his hearing aides   Collateral: Patient's son [name] [phone]) was present during entire interview with Probation officer and provider. [name] advised that he dad was fine until Saturday then started having bizarre behaviors. [name] stated this is something new , even with hid dad dementia he does pretty well and has never had AVH . [name] stated he is concerned because his dad carry  a 38 revolver on his person at all times and he is friar by him being paranoid he may shoot the wrong person , including himself. [name] looking for some reasons why his dad is displaying these new behaviors. Provider requested that [name] go the home to retrieve the hearing aides for the patient be able to clearly understand what is being said or done about his care. [name] agreed to get hearing aides from the home if he can find them.  Disposition: Per Edward Libra, NP , patient meet criteria for inpatient, Gero psych recommended for  patient   Chief Complaint:  Chief Complaint  Patient presents with  . Altered Mental Status   Visit Diagnosis: Psychosis    CCA Screening, Triage and Referral (STR)  Patient Reported Information How did you hear about [place]? Family/Friend  Referral name: No data recorded Referral phone number: No data recorded  Whom do you see for routine medical problems? Primary Care  Practice/Facility Name: [facility]  Practice/Facility Phone Number: No data recorded Name of Contact: No data recorded Contact Number: No data recorded Contact Fax Number: No data recorded Prescriber Name: No data recorded Prescriber Address (if known): No data recorded  What Is the Reason for Your Visit/Call Today? No data recorded How Long Has This Been Causing You Problems? 1 wk - 1 month  What Do You Feel Would Help You the Most Today? No data recorded  Have You Recently Been in Any Inpatient Treatment (Hospital/Detox/Crisis Center/28-Day Program)? No  Name/Location of Program/Hospital:No data recorded How Long Were You There? No data recorded When Were You Discharged? No data recorded  Have You Ever Received Services From [facility] Before? Yes  Who Do You See at [facility]? Dr [name]   Have You Recently Had Any Thoughts About [place]? No  Are You Planning to Commit Suicide/Harm Yourself At This time? No   Have you Recently Had Thoughts About [place]? No  Explanation: No data recorded  Have You Used Any Alcohol or Drugs in the Past 24 Hours? No  How Long Ago Did You Use Drugs or Alcohol? No data recorded What Did You Use and How Much? No data recorded  Do You Currently Have a Therapist/Psychiatrist? No  Name of Therapist/Psychiatrist: No data recorded  Have You Been Recently Discharged From Any Office Practice or Programs? No  Explanation of Discharge From Practice/Program: No data recorded    CCA Screening Triage Referral Assessment Type of Contact:  Tele-Assessment  Is this Initial or Reassessment? Initial Assessment  Date Telepsych consult ordered in [facility]:  10/06/2020  Time Telepsych consult ordered in [facility]:  1115   Patient Reported Information Reviewed? Yes  Patient Left Without Being Seen? No data recorded Reason for Not Completing Assessment: No data recorded  Collateral Involvement: No data recorded  Does Patient Have a [place]? No data recorded Name and Contact of Legal Guardian: No data recorded If Minor and Not Living with Parent(s), Who has Custody? No data recorded Is CPS involved or ever been involved? Never  Is APS involved or ever been involved? Never   Patient Determined To Be At Risk for Harm To Self or Others Based on Review of Patient Reported Information or Presenting Complaint? No  Method: No data recorded Availability of Means: No data recorded Intent: No data recorded Notification Required: No data recorded Additional Information for Danger to Others Potential: No data recorded Additional Comments for Danger to Others Potential: No data recorded Are There  Guns or Other Weapons in [place]? No data recorded Types of Guns/Weapons: No data recorded Are These Weapons Safely Secured?                            No data recorded Who Could Verify You Are Able To Have These Secured: No data recorded Do You Have any Outstanding Charges, Pending Court Dates, Parole/Probation? No data recorded Contacted To Inform of Risk of Harm To Self or Others: No data recorded  Location of Assessment: No data recorded  Does Patient Present under Involuntary Commitment? No data recorded IVC Papers Initial File Date: No data recorded  [place] of Residence: No data recorded  Patient Currently Receiving the Following Services: No data recorded  Determination of Need: No data recorded  Options For Referral: No data recorded    CCA Biopsychosocial Intake/Chief Complaint:  altered mental  status  Current Symptoms/Problems: AVH   Patient Reported Schizophrenia/Schizoaffective Diagnosis in Past: No   Strengths: No data recorded Preferences: No data recorded Abilities: No data recorded  Type of Services Patient Feels are Needed: No data recorded  Initial Clinical Notes/Concerns: No data recorded  Mental Health Symptoms Depression:  None   Duration of Depressive symptoms: No data recorded  Mania:  None   Anxiety:   None   Psychosis:  Hallucinations; Delusions   Duration of Psychotic symptoms: Less than six months   Trauma:  None   Obsessions:  N/A   Compulsions:  N/A   Inattention:  N/A   Hyperactivity/Impulsivity:  N/A   Oppositional/Defiant Behaviors:  N/A   Emotional Irregularity:  N/A   Other Mood/Personality Symptoms:  No data recorded   Mental Status Exam Appearance and self-care  Stature:  Tall   Weight:  Average weight   Clothing:  -- (scrubs)   Grooming:  Normal   Cosmetic use:  None   Posture/gait:  -- (in hospital bed)   Motor activity:  Not Remarkable   Sensorium  Attention:  Unaware; Confused   Concentration:  Normal   Orientation:  Place (unable to access)   Recall/memory:  Defective in Immediate (Dementia)   Affect and Mood  Affect:  No data recorded  Mood:  Euthymic   Relating  Eye contact:  Fleeting   Facial expression:  Responsive   Attitude toward examiner:  Cooperative   Thought and Language  Speech flow: Other (Comment) (patient did not have his hearing aides in , unable to hear TTS)   Thought content:  Delusions   Preoccupation:  None   Hallucinations:  Auditory; Visual; Tactile   Organization:  No data recorded  [employer] of Knowledge:  Fair   Intelligence:  Average   Abstraction:  Normal   Judgement:  Fair   Reality Testing:  Unaware   Insight:  Fair   Decision Making:  Paralyzed; Confused   Social Functioning  Social Maturity:  Responsible   Social  Judgement:  Normal   Stress  Stressors:  Other (Comment) (unknown)   Coping Ability:  -- (unable to access)   Skill Deficits:  Self-care; Decision making; Communication   Supports:  Family     Religion: Religion/Spirituality Are You A Religious Person?: Yes  Leisure/Recreation: Leisure / Recreation Do You Have Hobbies?: No  Exercise/Diet: Exercise/Diet Do You Exercise?: No Have You Gained or Lost A Significant Amount of Weight in the Past Six Months?: No Do You Follow a Special Diet?: No Do You Have  Any Trouble Sleeping?: No   CCA Employment/Education Employment/Work Situation: Employment / Work Situation Employment situation: Retired Has patient ever been in the [employer]?:  (unknown)  Education: Education Is Patient Currently Attending School?: No   CCA Family/Childhood History Family and Relationship History: Family history Marital status: Single Does patient have children?: Yes How many children?: 1 How is patient's relationship with their children?: good . son present int he hospital  Childhood History:  Childhood History By whom was/is the patient raised?: Other (Comment) (unable to access / patient hoh) Did patient suffer any verbal/emotional/physical/sexual abuse as a child?: No Did patient suffer from severe childhood neglect?: No Has patient ever been sexually abused/assaulted/raped as an adolescent or adult?: No Was the patient ever a victim of a crime or a disaster?: No Witnessed domestic violence?: No Has patient been affected by domestic violence as an adult?: No  Child/Adolescent Assessment:     CCA Substance Use Alcohol/Drug Use: Alcohol / Drug Use Pain Medications: SEE MAR Prescriptions: SEE MAR Over the Counter: SEE MAR History of alcohol / drug use?: No history of alcohol / drug abuse                         ASAM's:  Six Dimensions of Multidimensional Assessment  Dimension 1:  Acute Intoxication and/or Withdrawal  Potential:      Dimension 2:  Biomedical Conditions and Complications:      Dimension 3:  Emotional, Behavioral, or Cognitive Conditions and Complications:     Dimension 4:  Readiness to Change:     Dimension 5:  Relapse, Continued use, or Continued Problem Potential:     Dimension 6:  Recovery/Living Environment:     ASAM Severity Score:    ASAM Recommended Level of Treatment:     Substance use Disorder (SUD)    Recommendations for Services/Supports/Treatments: Recommendations for Services/Supports/Treatments Recommendations For Services/Supports/Treatments: Inpatient Hospitalization  DSM5 Diagnoses: Patient Active Problem List   Diagnosis Date Noted  . Psychosis ([place]) 10/06/2020  . Iron deficiency anemia 11/04/2018  . Orthostatic hypotension 08/06/2018  . Hypertension 08/06/2018  . Hyperlipidemia 08/06/2018  . Pancytopenia ([place]) 08/06/2018  . 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 Insufficient treatment with nasal CPAP 05/05/2018  . 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 Irregular heart beat 01/28/2018  . Noncompliance with CPAP treatment 08/14/2017  . Excessive daytime sleepiness 04/09/2017  . Snoring 04/09/2017  . Memory impairment of gradual onset 04/09/2017  . Aortic atherosclerosis ([place]) 11/27/2016  . 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 Altered mental status 03/05/2013  . Respiratory failure, post-operative ([place]) 03/02/2013  . Hepatitis B 02/24/2013  . Cancer, hepatocellular ([place]) 02/10/2013  . Liver tumor-bleeding 01/15/2013  . Epidermal cyst 06/18/2012  . OSA (obstructive sleep apnea) 03/13/2012  . Abnormal leg movement 12/12/2011  . Blood pressure elevated 09/06/2011  . Diabetes mellitus ([place]) 09/06/2011  . 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 BPH (benign prostatic hyperplasia) 09/06/2011  . Diverticula of colon 09/06/2011    Patient Centered Plan: Patient is on the  following Treatment Plan(s):    Referrals to Alternative Service(s): Referred to Alternative Service(s):   Place:   Date:   Time:    Referred to Alternative Service(s):   Place:   Date:   Time:    Referred to Alternative Service(s):   Place:   Date:   Time:    Referred to Alternative Service(s):   Place:  Date:   Time:     Gerre Sc[place], [place]

## 2020-10-06 NOTE — Consult Note (Signed)
Telepsych Consultation   Reason for Consult: Psychiatry provider reassessment Referring Physician: Dr. [name] Location of Patient: [name] department Location of Provider: [facility]  Patient Identification: [name] MRN:  [id number] Principal Diagnosis: Psychosis [facility]) Diagnosis:  Principal Problem:   Psychosis ([place])   Total Time spent with patient: 30 minutes  Subjective:   [name] is a 75 y.o. male patient.  Patient reports he does not have his hearing aids with him and does have some difficulty hearing this Probation officer.  Patient gives consent to speak with his son, [name] at bedside.   HPI:  Per patient's son patient began experiencing visual hallucinations on Saturday.  Patient has no history of auditory hallucinations prior to this episode.  Patient continues to report visual hallucinations at this time states he sees 2 additional people in the hospital room.  Patient assessed by nurse practitioner.  Patient pleasant and cooperative with assessment.  Patient notably hard of hearing.  Patient denies suicidal and homicidal ideations.  Patient denies auditory hallucinations.  Patient lives alone in [place], home health visits his home 3 times per week.  Patient does have access to weapons however patient's son has removed all of the weapons from the home.  Per son patient does manage his medications himself and has been diagnosed with dementia.  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  Son does have some concern that [name] may not be administering his own medications properly.     Past Psychiatric History: None reported  Risk to Self:  Denies Risk to Others:  Denies Prior Inpatient Therapy:  None reported Prior Outpatient  Therapy:  None reported  Past Medical History:  Past Medical History:  Diagnosis Date  . Cancer [facility])    hepatocellular cancer   . Diabetes mellitus without complication ([place])   . Dyspnea   . Hepatitis B   . Hyperlipidemia   . Hypertension   . Irregular heart beat 01/28/2018  . Narcolepsy    per office visit note of 08/2011   . Sleep apnea    cpap    Past Surgical History:  Procedure Laterality Date  . CHOLECYSTECTOMY N/A 01/28/2018   Procedure: LAPAROSCOPIC CHOLECYSTECTOMY;  Surgeon: [name], MD;  Location: [facility];  Service: General;  Laterality: N/A;  . COLONOSCOPY WITH PROPOFOL N/A 08/27/2017   Procedure: COLONOSCOPY WITH PROPOFOL;  Surgeon: Clarene Essex, MD;  Location: [facility];  Service: Endoscopy;  Laterality: N/A;  . ESOPHAGOGASTRODUODENOSCOPY (EGD) WITH PROPOFOL N/A 02/23/2020   Procedure: ESOPHAGOGASTRODUODENOSCOPY (EGD) WITH PROPOFOL;  Surgeon: Clarene Essex, MD;  [facility];  Service: Endoscopy;  Laterality: N/A;  . HOT HEMOSTASIS N/A 08/27/2017   Procedure: HOT HEMOSTASIS (ARGON PLASMA COAGULATION/BICAP);  Surgeon: Clarene Essex, MD;  Location[facility];  Service: Endoscopy;  Laterality: N/A;  . LAPAROSCOPIC CHOLECYSTECTOMY  01/28/2018  . LAPAROSCOPY  03/02/2013   Procedure: LAPAROSCOPY DIAGNOSTIC;  Surgeon: [name], MD;  [facility];  Service: General;;  . LIVER ULTRASOUND  03/02/2013   Procedure: LIVER ULTRASOUND;  Surgeon: [name], MD;  [facility];  Service: General;;  . OPEN PARTIAL HEPATECTOMY   03/02/2013   Procedure: OPEN PARTIAL HEPATECTOMY [83];  Surgeon: [name], MD;  [facility];  Service: General;;  DX LAPAROSCOPY, INTRAOPERATIVE LIVER ULTRASOUND, OPEN PARTIAL HEPATECTOMY  . pinched nerve in  back     Family History:  Family History  Problem Relation Age of Onset  . Dementia Mother   . Cancer Father   . Diabetes Brother   . Hypertension Brother   . Cancer Brother    Family Psychiatric  History: None  reported Social History:  Social History   Substance and Sexual Activity  Alcohol Use No     Social History   Substance and Sexual Activity  Drug Use No    Social History   Socioeconomic History  . Marital status: Divorced    Spouse name: Not on file  . Number of children: 2  . Years of education: 15+  . Highest education level: Bachelor's degree (e.g., BA, AB, BS)  Occupational History  . Occupation: Retired  Tobacco Use  . Smoking status: Former Smoker    Years: 5.00    Types: Cigarettes  . Smokeless tobacco: Never Used  Vaping Use  . Vaping Use: Never used  Substance and Sexual Activity  . Alcohol use: No  . Drug use: No  . Sexual activity: Not Currently  Other Topics Concern  . Not on file  Social History Narrative   Patient is single and lives alone- became widower when 1 child was 82 year old, divorced 2nd marriage   Has #2 grown children, not in area or involved in his life   Patient is retired.   Patient has a college education.   Patient is right-handed.   Patient drinks maybe two sodas daily.   Social Determinants of Health   Financial Resource Strain: Low Risk   . Difficulty of Paying Living Expenses: Not hard at all  Food Insecurity: Food Insecurity Present  . Worried About Charity fundraiser in the Last Year: Sometimes true  . Ran Out of Food in the Last Year: Sometimes true  Transportation Needs: No Transportation Needs  . Lack of Transportation (Medical): No  . Lack of Transportation (Non-Medical): No  Physical Activity: Inactive  . Days of Exercise per Week: 0 days  . Minutes of Exercise per Session: 0 min  Stress: No Stress Concern Present  . Feeling of Stress : Not at all  Social Connections: Moderately Isolated  . Frequency of Communication with Friends and Family: More than three times a week  . Frequency of Social Gatherings with Friends and Family: More than three times a week  . Attends Religious Services: 1 to 4 times per year  .  Active Member of Clubs or Organizations: No  . Attends Archivist Meetings: Never  . Marital Status: Divorced   Additional Social History:    Allergies:   Allergies  Allergen Reactions  . Ace Inhibitors Swelling and Other (See Comments)    Angioedema - face.     Labs:  Results for orders placed or performed during the hospital encounter of 10/05/20 (from the past 48 hour(s))  CBC with Differential/Platelet     Status: Abnormal   Collection Time: 10/05/20  7:00 PM  Result Value Ref Range   WBC 3.2 (L) 4.0 - 10.5 K/uL   RBC 3.60 (L) 4.22 - 5.81 MIL/uL   Hemoglobin 10.1 (L) 13.0 - 17.0 g/dL   HCT 33.2 (L) 39.0 - 52.0 %   MCV 92.2 80.0 - 100.0 fL   MCH 28.1 26.0 - 34.0 pg   MCHC 30.4 30.0 - 36.0 g/dL   RDW 20.0 (H) 11.5 - 15.5 %   Platelets 84 (L) 150 - 400 K/uL  Comment: SPECIMEN CHECKED FOR CLOTS Immature Platelet Fraction may be clinically indicated, consider ordering this additional test [id number] REPEATED TO VERIFY PLATELET COUNT CONFIRMED BY SMEAR    nRBC 0.0 0.0 - 0.2 %   Neutrophils Relative % 71 %   Neutro Abs 2.3 1.7 - 7.7 K/uL   Lymphocytes Relative 13 %   Lymphs Abs 0.4 (L) 0.7 - 4.0 K/uL   Monocytes Relative 12 %   Monocytes Absolute 0.4 0.1 - 1.0 K/uL   Eosinophils Relative 3 %   Eosinophils Absolute 0.1 0.0 - 0.5 K/uL   Basophils Relative 1 %   Basophils Absolute 0.0 0.0 - 0.1 K/uL   Immature Granulocytes 0 %   Abs Immature Granulocytes 0.01 0.00 - 0.07 K/uL    Comment: Performed at [facility], [place] [address] [place], [place] [address]  Comprehensive metabolic panel     Status: Abnormal   Collection Time: 10/05/20  7:00 PM  Result Value Ref Range   Sodium 140 135 - 145 mmol/L   Potassium 3.8 3.5 - 5.1 mmol/L   Chloride 108 98 - 111 mmol/L   CO2 22 22 - 32 mmol/L   Glucose, Bld 176 (H) 70 - 99 mg/dL    Comment: Glucose reference range applies only to samples taken after fasting for at least 8 hours.   BUN 11 8 - 23  mg/dL   Creatinine, Ser 0.96 0.61 - 1.24 mg/dL   Calcium 9.2 8.9 - 10.3 mg/dL   Total Protein 7.4 6.5 - 8.1 g/dL   Albumin 3.4 (L) 3.5 - 5.0 g/dL   AST 142 (H) 15 - 41 U/L   ALT 95 (H) 0 - 44 U/L   Alkaline Phosphatase 103 38 - 126 U/L   Total Bilirubin 1.9 (H) 0.3 - 1.2 mg/dL   GFR, Estimated >60 >60 mL/min    Comment: (NOTE) Calculated using the CKD-EPI Creatinine Equation (2021)    Anion gap 10 5 - 15    Comment: Performed at [facility], [place] [address] [place], [place] [address]  Urinalysis, Routine w reflex microscopic Urine, Clean Catch     Status: Abnormal   Collection Time: 10/05/20  9:16 PM  Result Value Ref Range   Color, Urine AMBER (A) YELLOW    Comment: BIOCHEMICALS MAY BE AFFECTED BY COLOR   APPearance CLEAR CLEAR   Specific Gravity, Urine 1.024 1.005 - 1.030   pH 5.0 5.0 - 8.0   Glucose, UA >=500 (A) NEGATIVE mg/dL   Hgb urine dipstick SMALL (A) NEGATIVE   Bilirubin Urine NEGATIVE NEGATIVE   Ketones, ur 5 (A) NEGATIVE mg/dL   Protein, ur 30 (A) NEGATIVE mg/dL   Nitrite NEGATIVE NEGATIVE   Leukocytes,Ua NEGATIVE NEGATIVE   RBC / HPF 0-5 0 - 5 RBC/hpf   WBC, UA 0-5 0 - 5 WBC/hpf   Bacteria, UA RARE (A) NONE SEEN   Mucus PRESENT     Comment: Performed at [facility], [place] [address], [place],  [address]  Rapid urine drug screen (hospital performed)     Status: Abnormal   Collection Time: 10/05/20  9:16 PM  Result Value Ref Range   Opiates NONE DETECTED NONE DETECTED   Cocaine NONE DETECTED NONE DETECTED   Benzodiazepines NONE DETECTED NONE DETECTED   Amphetamines POSITIVE (A) NONE DETECTED   Tetrahydrocannabinol NONE DETECTED NONE DETECTED   Barbiturates NONE DETECTED NONE DETECTED    Comment: (NOTE) DRUG SCREEN FOR MEDICAL PURPOSES ONLY.  IF CONFIRMATION  IS NEEDED FOR ANY PURPOSE, NOTIFY LAB WITHIN 5 DAYS.  LOWEST DETECTABLE LIMITS FOR URINE DRUG SCREEN Drug Class                     Cutoff  (ng/mL) Amphetamine and metabolites    1000 Barbiturate and metabolites    200 Benzodiazepine                 643 Tricyclics and metabolites     300 Opiates and metabolites        300 Cocaine and metabolites        300 THC                            50 Performed at [facility], [place] [address] [place], [place] [address]   Resp Panel by RT-PCR (Flu A&B, Covid) Nasopharyngeal Swab     Status: None   Collection Time: 10/05/20  9:26 PM   Specimen: Nasopharyngeal Swab; Nasopharyngeal(NP) swabs in vial transport medium  Result Value Ref Range   SARS Coronavirus 2 by RT PCR NEGATIVE NEGATIVE    Comment: (NOTE) SARS-CoV-2 target nucleic acids are NOT DETECTED.  The SARS-CoV-2 RNA is generally detectable in upper respiratory specimens during the acute phase of infection. The lowest concentration of SARS-CoV-2 viral copies this assay can detect is 138 copies/mL. A negative result does not preclude SARS-Cov-2 infection and should not be used as the sole basis for treatment or other patient management decisions. A negative result may occur with  improper specimen collection/handling, submission of specimen other than nasopharyngeal swab, presence of viral mutation(s) within the areas targeted by this assay, and inadequate number of viral copies(<138 copies/mL). A negative result must be combined with clinical observations, patient history, and epidemiological information. The expected result is Negative.  Fact Sheet for Patients:  EntrepreneurPulse.com.au  Fact Sheet for Healthcare Providers:  IncredibleEmployment.be  This test is no t yet approved or cleared by the [place] FDA and  has been authorized for detection and/or diagnosis of SARS-CoV-2 by FDA under an Emergency Use Authorization (EUA). This EUA will remain  in effect (meaning this test can be used) for the duration of the COVID-19 declaration under Section 564(b)(1)  of the Act, 21 U.S.C.section 360bbb-3(b)(1), unless the authorization is terminated  or revoked sooner.       Influenza A by PCR NEGATIVE NEGATIVE   Influenza B by PCR NEGATIVE NEGATIVE    Comment: (NOTE) The Xpert Xpress SARS-CoV-2/FLU/RSV plus assay is intended as an aid in the diagnosis of influenza from Nasopharyngeal swab specimens and should not be used as a sole basis for treatment. Nasal washings and aspirates are unacceptable for Xpert Xpress SARS-CoV-2/FLU/RSV testing.  Fact Sheet for Patients: EntrepreneurPulse.com.au  Fact Sheet for Healthcare Providers: IncredibleEmployment.be  This test is not yet approved or cleared by the [place] FDA and has been authorized for detection and/or diagnosis of SARS-CoV-2 by FDA under an Emergency Use Authorization (EUA). This EUA will remain in effect (meaning this test can be used) for the duration of the COVID-19 declaration under Section 564(b)(1) of the Act, 21 U.S.C. section 360bbb-3(b)(1), unless the authorization is terminated or revoked.  Performed at [facility], [place] [address] [place], [place] [address]   Ethanol     Status: None   Collection Time: 10/05/20 10:55 PM  Result Value Ref Range   Alcohol, Ethyl (B) <10 <10 mg/dL  Comment: (NOTE) Lowest detectable limit for serum alcohol is 10 mg/dL.  For medical purposes only. Performed at [facility], [place] [address], [place], [place] [address]     Medications:  Current Facility-Administered Medications  Medication Dose Route Frequency Provider Last Rate Last Admin  . donepezil (ARICEPT) tablet 10 mg  10 mg Oral Lauris Chroman, MD   10 mg at 10/05/20 2302  . glipiZIDE (GLUCOTROL) tablet 5 mg  5 mg Oral QAC breakfast Milton Ferguson, MD      . metFORMIN (GLUCOPHAGE) tablet 500 mg  500 mg Oral BID WC Milton Ferguson, MD      . pravastatin (PRAVACHOL) tablet 20 mg  20 mg Oral Lauris Chroman, MD   20 mg at 10/05/20 2303  . spironolactone (ALDACTONE) tablet 25 mg  25 mg Oral Daily Milton Ferguson, MD       Current Outpatient Medications  Medication Sig Dispense Refill  . alfuzosin (UROXATRAL) 10 MG 24 hr tablet Take 10 mg by mouth at bedtime.    . donepezil (ARICEPT) 10 MG tablet Take 1 tablet (10 mg total) by mouth at bedtime. 90 tablet 3  . ferrous sulfate 325 (65 FE) MG tablet Take 325 mg by mouth 3 (three) times daily with meals.    [name] Kitchen glipiZIDE (GLUCOTROL) 5 MG tablet Take 1 tablet (5 mg total) by mouth daily before breakfast. 90 tablet 1  . HYDROcodone-acetaminophen (NORCO/VICODIN) 5-325 MG tablet Take 1 tablet by mouth every 6 (six) hours as needed for moderate pain.    . metFORMIN (GLUCOPHAGE) 500 MG tablet Take 1 tablet (500 mg total) by mouth 2 (two) times daily with a meal. 180 tablet 1  . Multiple Vitamin (MULTIVITAMIN WITH MINERALS) TABS tablet Take 1 tablet by mouth daily.    . ondansetron (ZOFRAN-ODT) 4 MG disintegrating tablet Take 4 mg by mouth every 8 (eight) hours as needed for nausea or vomiting.    . pravastatin (PRAVACHOL) 20 MG tablet Take 1 tablet (20 mg total) by mouth at bedtime. 90 tablet 1  . psyllium (KLS NATURAL PSYLLIUM FIBER) 58.6 % powder Take 1 packet by mouth daily.    . sertraline (ZOLOFT) 25 MG tablet Take 25 mg by mouth daily.    [name] Kitchen spironolactone (ALDACTONE) 25 MG tablet Take 1 tablet (25 mg total) by mouth daily. 90 tablet 1    Musculoskeletal: Strength & Muscle Tone: decreased Gait & Station: Unable to assess Patient leans: N/A  Psychiatric Specialty Exam: Physical Exam Vitals and nursing note reviewed.  Constitutional:      Appearance: He is well-developed.  HENT:     Head: Normocephalic.  Cardiovascular:     Rate and Rhythm: Normal rate.  Pulmonary:     Effort: Pulmonary effort is normal.  Neurological:     Mental Status: He is alert. He is disoriented.  Psychiatric:        Attention and Perception: He is  inattentive. He perceives visual hallucinations.        Mood and Affect: Mood normal.        Speech: Speech is delayed.        Behavior: Behavior is cooperative.        Cognition and Memory: Cognition is impaired.     Review of Systems  Constitutional: Negative.   HENT: Negative.   Eyes: Negative.   Respiratory: Negative.   Cardiovascular: Negative.   Gastrointestinal: Negative.   Genitourinary: Negative.   Musculoskeletal: Negative.   Skin: Negative.  Neurological: Negative.   Psychiatric/Behavioral: Positive for confusion and hallucinations.    Blood pressure 131/64, pulse 65, temperature 97.6 F (36.4 C), temperature source Oral, resp. rate 16, SpO2 100 %.There is no height or weight on file to calculate BMI.  General Appearance: Disheveled  Eye Contact:  Fair  Speech:  Slow  Volume:  Normal  Mood:  Euthymic  Affect:  Congruent  Thought Process:  Descriptions of Associations: Loose  Orientation:  Other:  self only  Thought Content:  Logical  Suicidal Thoughts:  No  Homicidal Thoughts:  No  Memory:  Immediate;   Poor  Judgement:  Impaired  Insight:  Lacking  Psychomotor Activity:  Normal  Concentration:  Concentration: Poor  Recall:  Poor  Fund of Knowledge:  Fair  Language:  Fair  Akathisia:  No  Handed:  Right  AIMS (if indicated):     Assets:  Communication Skills Desire for Improvement Financial Resources/Insurance Housing Intimacy Leisure Time Physical Health Resilience Social Support  ADL's:  Intact  Cognition:  Impaired,  Mild  Sleep:        Treatment Plan Summary: Patient currently meets criteria for involuntary commitment petition. EKG requested Medication management and Plan Patient reviewed with Dr. Serafina Mitchell.  Inpatient geriatric psychiatric treatment recommended. Medication: Initiate Seroquel 12.5 mg nightly/hallucinations  Disposition: Recommend psychiatric Inpatient admission when medically cleared. Supportive therapy provided about  ongoing stressors.  This service was provided via telemedicine using a 2-way, interactive audio and video technology.  Names of all persons participating in this telemedicine service and their role in this encounter. Name: Saverio Danker Role: Patient  Name: Mr Latta-at bedside Role: Patient's son  Name: Letitia Libra Role: FNP  Name:  Role: [place], [place] 10/06/2020 11:43 AM

## 2020-10-06 NOTE — ED Notes (Addendum)
Pt. In burgundy scrubs and wanded by security. Pt. Has 1 red hat, 1pr. Shoes, 1 pr white socks, 1 blue jean pant, car keys, 1 green shirt, 1 black belt, and 1 beige jacket. Pt. Has 2 belongings bags. Pt. Has hearing aid in ear with black strap attached to his shirt. No cell phone and no wallet. Pt. Belongings locked up in cabinet Zone 9-12.

## 2020-10-07 DIAGNOSIS — F29 Unspecified psychosis not due to a substance or known physiological condition: Secondary | ICD-10-CM

## 2020-10-07 NOTE — Progress Notes (Signed)
TOC CM spoke to son, [name]. States niece is the HCPOA. Gave permission to send referral to [facility] to assist with finding Memory Care. Will provide son with list of different ALF/MC in [place]. [place], [place] ED TOC CM [phone]

## 2020-10-07 NOTE — Discharge Instructions (Signed)
For your behavioral health needs you are advised to follow up with an outpatient psychiatrist.  The following practices have psychiatrists that specialize in providing care for older adults.  Norma Fredrickson, MD sees patients at the these two practices:       [facility]. [address] [employer]. [place], [place] [address]      [phone]       [facility] [place]      [address], [place] [address]      [place], [place] [address]      [phone]   Leanord Hawking, MD sees patient at his own practice:       [address] [place],  [address]      [phone]  Contact them at your earliest opportunity to ask about scheduling an initial appointment:

## 2020-10-07 NOTE — Consult Note (Addendum)
Telepsych Consultation   Reason for Consult: Psychiatry provider reassessment Referring Physician: Dr. Zenia Resides Location of Patient: [name] emergency department Location of Provider: [place] [facility]  Patient Identification: MIRL HILLERY MRN:  [id number] Principal Diagnosis: Psychosis [facility]) Diagnosis:  Principal Problem:   Psychosis ([place])   Total Time spent with patient: 30 minutes  Subjective:   STAFFORD RIVIERA is a 75 y.o. male patient admitted with visual hallucinations. Patient states "can I go home today?"  HPI:   Patient hard of hearing, TTS counselor at bedside to assist with tele psychiatry consult.  Patient admitted with new onset visual hallucinations.  Per patient's son there has been no history of hallucinations.  Quetiapine 12.5 mg nightly initiated on yesterday.,  Patient reports he tolerated this medication well and believes he slept all night.  Patient denies hallucinations currently.   Patient re assessed by nurse practitioner.  Patient alert and oriented today, answers appropriately.  Patient reports he does not recall hallucinations.  Patient manages his medications at home and agrees he may have inadvertently taken the wrong medications.  Patient urine drug screen positive for amphetamines however patient no longer prescribed this medication.  Patient does have home health aide who assist with household tasks.  Patient reports he is independent of ADLs but requires assistance with housekeeping.  Patient reports he will ask family member to assist with medication management.  Patient denies suicidal or homicidal ideations.  Patient denies any history of suicide attempts.  Patient denies auditory and visual hallucinations.  There is no evidence of delusional thought content, no indication that patient is responding to internal stimuli.  Patient reports readiness to discharge home.  Patient gives consent to speak with son, Etta Grandchild. TTS counselor and social worker currently reaching out to patient's son to discuss disposition and follow-up.  Past Psychiatric History: None reported   Risk to Self:  Denies Risk to Others:  Denies Prior Inpatient Therapy:  None reported Prior Outpatient Therapy:  None reported  Past Medical History:  Past Medical History:  Diagnosis Date  . Cancer [facility])    hepatocellular cancer   . Diabetes mellitus without complication ([place])   . Dyspnea   . Hepatitis B   . Hyperlipidemia   . Hypertension   . Irregular heart beat 01/28/2018  . Narcolepsy    per office visit note of 08/2011   . Sleep apnea    cpap    Past Surgical History:  Procedure Laterality Date  . CHOLECYSTECTOMY N/A 01/28/2018   Procedure: LAPAROSCOPIC CHOLECYSTECTOMY;  Surgeon: [name], MD;  Location: [place];  Service: General;  Laterality: N/A;  . COLONOSCOPY WITH PROPOFOL N/A 08/27/2017   Procedure: COLONOSCOPY WITH PROPOFOL;  Surgeon: Clarene Essex, MD;  [facility];  Service: Endoscopy;  Laterality: N/A;  . ESOPHAGOGASTRODUODENOSCOPY (EGD) WITH PROPOFOL N/A 02/23/2020   Procedure: ESOPHAGOGASTRODUODENOSCOPY (EGD) WITH PROPOFOL;  Surgeon: Clarene Essex, MD;  [facility];  Service: Endoscopy;  Laterality: N/A;  . HOT HEMOSTASIS N/A 08/27/2017   Procedure: HOT HEMOSTASIS (ARGON PLASMA COAGULATION/BICAP);  Surgeon: Clarene Essex, MD;  Location[facility];  Service: Endoscopy;  Laterality: N/A;  . LAPAROSCOPIC CHOLECYSTECTOMY  01/28/2018  . LAPAROSCOPY  03/02/2013   Procedure: LAPAROSCOPY DIAGNOSTIC;  Surgeon: [name], MD;  [facility];  Service: General;;  . LIVER ULTRASOUND  03/02/2013   Procedure: LIVER ULTRASOUND;  Surgeon: [name], MD;  [facility];  Service: General;;  . OPEN PARTIAL HEPATECTOMY   03/02/2013  Procedure: OPEN PARTIAL HEPATECTOMY [83];  Surgeon: [name], MD;  [facility];  Service: General;;  DX LAPAROSCOPY, INTRAOPERATIVE LIVER ULTRASOUND, OPEN  PARTIAL HEPATECTOMY  . pinched nerve in back     Family History:  Family History  Problem Relation Age of Onset  . Dementia Mother   . Cancer Father   . Diabetes Brother   . Hypertension Brother   . Cancer Brother    Family Psychiatric  History: None reported Social History:  Social History   Substance and Sexual Activity  Alcohol Use No     Social History   Substance and Sexual Activity  Drug Use No    Social History   Socioeconomic History  . Marital status: Divorced    Spouse name: Not on file  . Number of children: 2  . Years of education: 15+  . Highest education level: Bachelor's degree (e.g., BA, AB, BS)  Occupational History  . Occupation: Retired  Tobacco Use  . Smoking status: Former Smoker    Years: 5.00    Types: Cigarettes  . Smokeless tobacco: Never Used  Vaping Use  . Vaping Use: Never used  Substance and Sexual Activity  . Alcohol use: No  . Drug use: No  . Sexual activity: Not Currently  Other Topics Concern  . Not on file  Social History Narrative   Patient is single and lives alone- became widower when 1 child was 91 year old, divorced 2nd marriage   Has #2 grown children, not in area or involved in his life   Patient is retired.   Patient has a college education.   Patient is right-handed.   Patient drinks maybe two sodas daily.   Social Determinants of Health   Financial Resource Strain: Low Risk   . Difficulty of Paying Living Expenses: Not hard at all  Food Insecurity: Food Insecurity Present  . Worried About Charity fundraiser in the Last Year: Sometimes true  . Ran Out of Food in the Last Year: Sometimes true  Transportation Needs: No Transportation Needs  . Lack of Transportation (Medical): No  . Lack of Transportation (Non-Medical): No  Physical Activity: Inactive  . Days of Exercise per Week: 0 days  . Minutes of Exercise per Session: 0 min  Stress: No Stress Concern Present  . Feeling of Stress : Not at all  Social  Connections: Moderately Isolated  . Frequency of Communication with Friends and Family: More than three times a week  . Frequency of Social Gatherings with Friends and Family: More than three times a week  . Attends Religious Services: 1 to 4 times per year  . Active Member of Clubs or Organizations: No  . Attends Archivist Meetings: Never  . Marital Status: Divorced   Additional Social History:    Allergies:   Allergies  Allergen Reactions  . Ace Inhibitors Swelling and Other (See Comments)    Angioedema - face.     Labs:  Results for orders placed or performed during the hospital encounter of 10/05/20 (from the past 48 hour(s))  CBC with Differential/Platelet     Status: Abnormal   Collection Time: 10/05/20  7:00 PM  Result Value Ref Range   WBC 3.2 (L) 4.0 - 10.5 K/uL   RBC 3.60 (L) 4.22 - 5.81 MIL/uL   Hemoglobin 10.1 (L) 13.0 - 17.0 g/dL   HCT 33.2 (L) 39.0 - 52.0 %   MCV 92.2 80.0 - 100.0 fL   MCH 28.1 26.0 -  34.0 pg   MCHC 30.4 30.0 - 36.0 g/dL   RDW 20.0 (H) 11.5 - 15.5 %   Platelets 84 (L) 150 - 400 K/uL    Comment: SPECIMEN CHECKED FOR CLOTS Immature Platelet Fraction may be clinically indicated, consider ordering this additional test [id number] REPEATED TO VERIFY PLATELET COUNT CONFIRMED BY SMEAR    nRBC 0.0 0.0 - 0.2 %   Neutrophils Relative % 71 %   Neutro Abs 2.3 1.7 - 7.7 K/uL   Lymphocytes Relative 13 %   Lymphs Abs 0.4 (L) 0.7 - 4.0 K/uL   Monocytes Relative 12 %   Monocytes Absolute 0.4 0.1 - 1.0 K/uL   Eosinophils Relative 3 %   Eosinophils Absolute 0.1 0.0 - 0.5 K/uL   Basophils Relative 1 %   Basophils Absolute 0.0 0.0 - 0.1 K/uL   Immature Granulocytes 0 %   Abs Immature Granulocytes 0.01 0.00 - 0.07 K/uL    Comment: Performed at [facility], [place] [address], [place], [place] [address]  Comprehensive metabolic panel     Status: Abnormal   Collection Time: 10/05/20  7:00 PM  Result Value Ref Range   Sodium 140  135 - 145 mmol/L   Potassium 3.8 3.5 - 5.1 mmol/L   Chloride 108 98 - 111 mmol/L   CO2 22 22 - 32 mmol/L   Glucose, Bld 176 (H) 70 - 99 mg/dL    Comment: Glucose reference range applies only to samples taken after fasting for at least 8 hours.   BUN 11 8 - 23 mg/dL   Creatinine, Ser 0.96 0.61 - 1.24 mg/dL   Calcium 9.2 8.9 - 10.3 mg/dL   Total Protein 7.4 6.5 - 8.1 g/dL   Albumin 3.4 (L) 3.5 - 5.0 g/dL   AST 142 (H) 15 - 41 U/L   ALT 95 (H) 0 - 44 U/L   Alkaline Phosphatase 103 38 - 126 U/L   Total Bilirubin 1.9 (H) 0.3 - 1.2 mg/dL   GFR, Estimated >60 >60 mL/min    Comment: (NOTE) Calculated using the CKD-EPI Creatinine Equation (2021)    Anion gap 10 5 - 15    Comment: Performed at [facility], [place] [address] [place], [place] [address]  Urinalysis, Routine w reflex microscopic Urine, Clean Catch     Status: Abnormal   Collection Time: 10/05/20  9:16 PM  Result Value Ref Range   Color, Urine AMBER (A) YELLOW    Comment: BIOCHEMICALS MAY BE AFFECTED BY COLOR   APPearance CLEAR CLEAR   Specific Gravity, Urine 1.024 1.005 - 1.030   pH 5.0 5.0 - 8.0   Glucose, UA >=500 (A) NEGATIVE mg/dL   Hgb urine dipstick SMALL (A) NEGATIVE   Bilirubin Urine NEGATIVE NEGATIVE   Ketones, ur 5 (A) NEGATIVE mg/dL   Protein, ur 30 (A) NEGATIVE mg/dL   Nitrite NEGATIVE NEGATIVE   Leukocytes,Ua NEGATIVE NEGATIVE   RBC / HPF 0-5 0 - 5 RBC/hpf   WBC, UA 0-5 0 - 5 WBC/hpf   Bacteria, UA RARE (A) NONE SEEN   Mucus PRESENT     Comment: Performed at [facility], [place] [address] [place],  [address]  Rapid urine drug screen (hospital performed)     Status: Abnormal   Collection Time: 10/05/20  9:16 PM  Result Value Ref Range   Opiates NONE DETECTED NONE DETECTED   Cocaine NONE DETECTED NONE DETECTED   Benzodiazepines NONE DETECTED NONE DETECTED   Amphetamines POSITIVE (  A) NONE DETECTED   Tetrahydrocannabinol NONE DETECTED NONE DETECTED   Barbiturates  NONE DETECTED NONE DETECTED    Comment: (NOTE) DRUG SCREEN FOR MEDICAL PURPOSES ONLY.  IF CONFIRMATION IS NEEDED FOR ANY PURPOSE, NOTIFY LAB WITHIN 5 DAYS.  LOWEST DETECTABLE LIMITS FOR URINE DRUG SCREEN Drug Class                     Cutoff (ng/mL) Amphetamine and metabolites    1000 Barbiturate and metabolites    200 Benzodiazepine                 967 Tricyclics and metabolites     300 Opiates and metabolites        300 Cocaine and metabolites        300 THC                            50 Performed at [facility], [place] [address] [place], [place] [address]   Resp Panel by RT-PCR (Flu A&B, Covid) Nasopharyngeal Swab     Status: None   Collection Time: 10/05/20  9:26 PM   Specimen: Nasopharyngeal Swab; Nasopharyngeal(NP) swabs in vial transport medium  Result Value Ref Range   SARS Coronavirus 2 by RT PCR NEGATIVE NEGATIVE    Comment: (NOTE) SARS-CoV-2 target nucleic acids are NOT DETECTED.  The SARS-CoV-2 RNA is generally detectable in upper respiratory specimens during the acute phase of infection. The lowest concentration of SARS-CoV-2 viral copies this assay can detect is 138 copies/mL. A negative result does not preclude SARS-Cov-2 infection and should not be used as the sole basis for treatment or other patient management decisions. A negative result may occur with  improper specimen collection/handling, submission of specimen other than nasopharyngeal swab, presence of viral mutation(s) within the areas targeted by this assay, and inadequate number of viral copies(<138 copies/mL). A negative result must be combined with clinical observations, patient history, and epidemiological information. The expected result is Negative.  Fact Sheet for Patients:  EntrepreneurPulse.com.au  Fact Sheet for Healthcare Providers:  IncredibleEmployment.be  This test is no t yet approved or cleared by the [place] FDA and   has been authorized for detection and/or diagnosis of SARS-CoV-2 by FDA under an Emergency Use Authorization (EUA). This EUA will remain  in effect (meaning this test can be used) for the duration of the COVID-19 declaration under Section 564(b)(1) of the Act, 21 U.S.C.section 360bbb-3(b)(1), unless the authorization is terminated  or revoked sooner.       Influenza A by PCR NEGATIVE NEGATIVE   Influenza B by PCR NEGATIVE NEGATIVE    Comment: (NOTE) The Xpert Xpress SARS-CoV-2/FLU/RSV plus assay is intended as an aid in the diagnosis of influenza from Nasopharyngeal swab specimens and should not be used as a sole basis for treatment. Nasal washings and aspirates are unacceptable for Xpert Xpress SARS-CoV-2/FLU/RSV testing.  Fact Sheet for Patients: EntrepreneurPulse.com.au  Fact Sheet for Healthcare Providers: IncredibleEmployment.be  This test is not yet approved or cleared by the [place] FDA and has been authorized for detection and/or diagnosis of SARS-CoV-2 by FDA under an Emergency Use Authorization (EUA). This EUA will remain in effect (meaning this test can be used) for the duration of the COVID-19 declaration under Section 564(b)(1) of the Act, 21 U.S.C. section 360bbb-3(b)(1), unless the authorization is terminated or revoked.  Performed at [facility], [place] [address] [place], [place] [address]  Ethanol     Status: None   Collection Time: 10/05/20 10:55 PM  Result Value Ref Range   Alcohol, Ethyl (B) <10 <10 mg/dL    Comment: (NOTE) Lowest detectable limit for serum alcohol is 10 mg/dL.  For medical purposes only. Performed at [facility], [place] [address], [place], [place] [address]   CBG monitoring, ED     Status: Abnormal   Collection Time: 10/06/20  4:09 PM  Result Value Ref Range   Glucose-Capillary 159 (H) 70 - 99 mg/dL    Comment: Glucose reference range applies only to  samples taken after fasting for at least 8 hours.    Medications:  Current Facility-Administered Medications  Medication Dose Route Frequency Provider Last Rate Last Admin  . donepezil (ARICEPT) tablet 10 mg  10 mg Oral Lauris Chroman, MD   10 mg at 10/06/20 2146  . glipiZIDE (GLUCOTROL) tablet 5 mg  5 mg Oral QAC breakfast Milton Ferguson, MD      . metFORMIN (GLUCOPHAGE) tablet 500 mg  500 mg Oral BID WC Milton Ferguson, MD   500 mg at 10/06/20 1618  . pravastatin (PRAVACHOL) tablet 20 mg  20 mg Oral Lauris Chroman, MD   20 mg at 10/06/20 2146  . QUEtiapine (SEROQUEL) tablet 12.5 mg  12.5 mg Oral QHS Emmaline Kluver, FNP   12.5 mg at 10/06/20 2146  . spironolactone (ALDACTONE) tablet 25 mg  25 mg Oral Daily Milton Ferguson, MD       Current Outpatient Medications  Medication Sig Dispense Refill  . alfuzosin (UROXATRAL) 10 MG 24 hr tablet Take 10 mg by mouth at bedtime.    . donepezil (ARICEPT) 10 MG tablet Take 1 tablet (10 mg total) by mouth at bedtime. 90 tablet 3  . ferrous sulfate 325 (65 FE) MG tablet Take 325 mg by mouth 3 (three) times daily with meals.    [name] Kitchen glipiZIDE (GLUCOTROL) 5 MG tablet Take 1 tablet (5 mg total) by mouth daily before breakfast. 90 tablet 1  . HYDROcodone-acetaminophen (NORCO/VICODIN) 5-325 MG tablet Take 1 tablet by mouth every 6 (six) hours as needed for moderate pain.    . metFORMIN (GLUCOPHAGE) 500 MG tablet Take 1 tablet (500 mg total) by mouth 2 (two) times daily with a meal. 180 tablet 1  . Multiple Vitamin (MULTIVITAMIN WITH MINERALS) TABS tablet Take 1 tablet by mouth daily.    . ondansetron (ZOFRAN-ODT) 4 MG disintegrating tablet Take 4 mg by mouth every 8 (eight) hours as needed for nausea or vomiting.    . pravastatin (PRAVACHOL) 20 MG tablet Take 1 tablet (20 mg total) by mouth at bedtime. 90 tablet 1  . psyllium (KLS NATURAL PSYLLIUM FIBER) 58.6 % powder Take 1 packet by mouth daily.    . sertraline (ZOLOFT) 25 MG tablet Take 25 mg by mouth  daily.    [name] Kitchen spironolactone (ALDACTONE) 25 MG tablet Take 1 tablet (25 mg total) by mouth daily. 90 tablet 1    Musculoskeletal: Strength & Muscle Tone: decreased Gait & Station: Unable to assess Patient leans: N/A  Psychiatric Specialty Exam: Physical Exam Vitals and nursing note reviewed.  Constitutional:      Appearance: He is well-developed.  HENT:     Head: Normocephalic.  Cardiovascular:     Rate and Rhythm: Normal rate.  Pulmonary:     Effort: Pulmonary effort is normal.  Neurological:     Mental Status: He is alert and oriented to person, place, and  time.  Psychiatric:        Attention and Perception: Attention and perception normal.        Mood and Affect: Mood and affect normal.        Speech: Speech normal.        Behavior: Behavior normal. Behavior is cooperative.        Thought Content: Thought content normal.        Cognition and Memory: Cognition normal.        Judgment: Judgment normal.     Review of Systems  Constitutional: Negative.   HENT: Negative.   Eyes: Negative.   Respiratory: Negative.   Cardiovascular: Negative.   Gastrointestinal: Negative.   Genitourinary: Negative.   Musculoskeletal: Negative.   Skin: Negative.   Neurological: Negative.   Psychiatric/Behavioral: Negative.     Blood pressure 135/63, pulse 73, temperature (!) 97.3 F (36.3 C), temperature source Oral, resp. rate 14, SpO2 100 %.There is no height or weight on file to calculate BMI.  General Appearance: Casual  Eye Contact:  Good  Speech:  Clear and Coherent and Normal Rate  Volume:  Normal  Mood:  Euthymic  Affect:  Congruent  Thought Process:  Coherent, Goal Directed and Descriptions of Associations: Intact  Orientation:  Full (Time, Place, and Person)  Thought Content:  Logical  Suicidal Thoughts:  No  Homicidal Thoughts:  No  Memory:  Immediate;   Fair Recent;   Fair Remote;   Fair  Judgement:  Fair  Insight:  Fair  Psychomotor Activity:  Normal   Concentration:  Concentration: Fair and Attention Span: Fair  Recall:  [employer] of Knowledge:  Fair  Language:  Fair  Akathisia:  No  Handed:  Right  AIMS (if indicated):     Assets:  Communication Skills Desire for Improvement Financial Resources/Insurance Housing Intimacy Leisure Time Physical Health Resilience Social Support Talents/Skills  ADL's:  Intact  Cognition:  WNL  Sleep:        Treatment Plan Summary: Patient reviewed with Dr. Serafina Mitchell. Follow-up with outpatient psychiatry and primary care provider. Plan Patient cleared by psychiatry.  Medications: -Quetiapine 12.5 mg nightly/mood  Social work consult initiated.    Disposition: No evidence of imminent risk to self or others at present.   Patient does not meet criteria for psychiatric inpatient admission. Discussed crisis plan, support from social network, calling 911, coming to the Emergency Department, and calling Suicide Hotline.  This service was provided via telemedicine using a 2-way, interactive audio and video technology.  Names of all persons participating in this telemedicine service and their role in this encounter. Name: Saverio Danker Role: Patient  Name: Letitia Libra Role: FNP  Name: Dr. Serafina Mitchell Role: Psychiatrist    Emmaline Kluver, FNP 10/07/2020 8:59 AM

## 2020-10-07 NOTE — Progress Notes (Signed)
..   Transition of Care [facility]) - Emergency Department Mini Assessment   Patient Details  Name: [name] MRN: [id number] Date of Birth: 1946-02-05  Transition of Care [facility]) CM/SW Contact:    Chanee Henrickson C Tarpley-Carter, [place] Phone Number: 10/07/2020, 11:24 AM   Clinical Narrative: TOC CM/CSW spoke with pt and pts family. CSW alerted family that they should began searching for ALF, LTC, or coming to live with a family member.  Pt currently lives alone and this would no longer be desirable due to pts altered mental status, CSW expressed to pts family.  Family agreed with CSW, and stated they would look into it.  [name] Latta/pts son has agreed to pick up pt.  Wacey Zieger Tarpley-Carter, MSW, LCSW-A Pronouns:  She, Her, Hers                  [facility] ED Transitions of CareClinical Social Worker [email or web address] [phone]      ED Mini Assessment: What brought you to the Emergency Department? : Altered Mental Status  Barriers to Discharge: No Barriers Identified     Means of departure: Car  Interventions which prevented an admission or readmission: Transportation Screening    Patient Contact and Communications Key Contact 1: Etta Grandchild Key Contact 2: [place] with: Harlene Salts Contact Date: 10/07/20,     Contact Phone Number: [phone] Call outcome: CSW stated to family the outcome goal should be seeking ALF or LTC.  Patient states their goals for this hospitalization and ongoing recovery are:: Pt currently wants to go home. CMS Medicare.gov Compare Post Acute Care list provided to:: Patient Choice offered to / list presented to : Patient  Admission diagnosis:  hallucinations Patient Active Problem List   Diagnosis Date Noted  . Psychosis ([place]) 10/06/2020  . Iron deficiency anemia 11/04/2018  . Orthostatic hypotension 08/06/2018  . Hypertension 08/06/2018  . Hyperlipidemia 08/06/2018  . Pancytopenia ([place])  08/06/2018  . 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 Insufficient treatment with nasal CPAP 05/05/2018  . 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 Irregular heart beat 01/28/2018  . Noncompliance with CPAP treatment 08/14/2017  . Excessive daytime sleepiness 04/09/2017  . Snoring 04/09/2017  . Memory impairment of gradual onset 04/09/2017  . Aortic atherosclerosis ([place]) 11/27/2016  . 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 Altered mental status 03/05/2013  . Respiratory failure, post-operative ([place]) 03/02/2013  . Hepatitis B 02/24/2013  . Cancer, hepatocellular ([place]) 02/10/2013  . Liver tumor-bleeding 01/15/2013  . Epidermal cyst 06/18/2012  . OSA (obstructive sleep apnea) 03/13/2012  . Abnormal leg movement 12/12/2011  . Blood pressure elevated 09/06/2011  . Diabetes mellitus ([place]) 09/06/2011  . 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 BPH (benign prostatic hyperplasia) 09/06/2011  . Diverticula of colon 09/06/2011   PCP:  Wendie Agreste, MD Pharmacy:   [facility] [place]), [place] - [address] [place] [address] Phone: [phone] Fax: [phone]  [place], [place], [address] [place], [address] [place] [address] Phone: [phone] Fax: [phone]  [id number] - [place], [place] - [address] [place] [address] Phone: [phone] Fax: [phone]

## 2020-10-07 NOTE — ED Notes (Signed)
Discharge paperwork reviewed with pts son, [name].  Pt assisted via wheelchair to sons car.  Pt calm and cooperative throughout.  All questions addressed with son prior to discharge.

## 2020-10-07 NOTE — ED Notes (Signed)
Pt given lunch tray and assisted with placement of hearing aid. Pt hearing aid is attached to his shirt.

## 2020-10-07 NOTE — ED Notes (Signed)
Pt alert to self, able to take meds whole with water.  Cooperative and calm at this time.

## 2020-10-07 NOTE — BH Assessment (Addendum)
[place] Assessment Progress Note  Per Letitia Libra NP, this pt does not require psychiatric hospitalization at this time.  Pt presents under IVC initiated by EDP Gareth Morgan, MD which has been rescinded by EDP Lacretia Leigh MD.  Pt is psychiatrically cleared.  Discharge instructions include referral information for outpatient geriatric psychiatry.  A TOC consult has been ordered to address pt's psychosocial needs.  Dr Zenia Resides and pt's nurse, Carolynne, have been notified.  Jalene Mullet, [place] Triage Specialist [phone]

## 2020-10-07 NOTE — ED Notes (Addendum)
Pt belongings returned to pt. Pt assisted with changing. Pt unsteady getting dressed while standing and does well with verbal commands. Pt ready and waiting for pickup by family.

## 2020-10-10 ENCOUNTER — Emergency Department (HOSPITAL_COMMUNITY): Payer: Medicare Other

## 2020-10-10 ENCOUNTER — Emergency Department (HOSPITAL_COMMUNITY)
Admission: EM | Admit: 2020-10-10 | Discharge: 2020-10-10 | Disposition: A | Discharge status: Home / Self Care | Payer: Medicare Other | Attending: Emergency Medicine | Admitting: Emergency Medicine

## 2020-10-10 ENCOUNTER — Encounter (HOSPITAL_COMMUNITY): Payer: Self-pay | Admitting: Emergency Medicine

## 2020-10-10 DIAGNOSIS — D72819 Decreased white blood cell count, unspecified: Secondary | ICD-10-CM | POA: Diagnosis not present

## 2020-10-10 DIAGNOSIS — W182XXA Fall in (into) shower or empty bathtub, initial encounter: Secondary | ICD-10-CM | POA: Diagnosis not present

## 2020-10-10 DIAGNOSIS — M4312 Spondylolisthesis, cervical region: Secondary | ICD-10-CM | POA: Diagnosis not present

## 2020-10-10 DIAGNOSIS — M47812 Spondylosis without myelopathy or radiculopathy, cervical region: Secondary | ICD-10-CM | POA: Diagnosis not present

## 2020-10-10 DIAGNOSIS — M79605 Pain in left leg: Secondary | ICD-10-CM | POA: Diagnosis not present

## 2020-10-10 DIAGNOSIS — I6782 Cerebral ischemia: Secondary | ICD-10-CM | POA: Diagnosis not present

## 2020-10-10 DIAGNOSIS — Y92002 Bathroom of unspecified non-institutional (private) residence single-family (private) house as the place of occurrence of the external cause: Secondary | ICD-10-CM | POA: Diagnosis not present

## 2020-10-10 DIAGNOSIS — S199XXA Unspecified injury of neck, initial encounter: Secondary | ICD-10-CM | POA: Diagnosis not present

## 2020-10-10 DIAGNOSIS — S40022A Contusion of left upper arm, initial encounter: Secondary | ICD-10-CM | POA: Insufficient documentation

## 2020-10-10 DIAGNOSIS — G319 Degenerative disease of nervous system, unspecified: Secondary | ICD-10-CM | POA: Diagnosis not present

## 2020-10-10 DIAGNOSIS — Z79899 Other long term (current) drug therapy: Secondary | ICD-10-CM | POA: Insufficient documentation

## 2020-10-10 DIAGNOSIS — S0990XA Unspecified injury of head, initial encounter: Secondary | ICD-10-CM | POA: Diagnosis not present

## 2020-10-10 DIAGNOSIS — E119 Type 2 diabetes mellitus without complications: Secondary | ICD-10-CM | POA: Insufficient documentation

## 2020-10-10 DIAGNOSIS — W19XXXA Unspecified fall, initial encounter: Secondary | ICD-10-CM

## 2020-10-10 DIAGNOSIS — M4802 Spinal stenosis, cervical region: Secondary | ICD-10-CM | POA: Diagnosis not present

## 2020-10-10 DIAGNOSIS — I10 Essential (primary) hypertension: Secondary | ICD-10-CM | POA: Insufficient documentation

## 2020-10-10 DIAGNOSIS — M79604 Pain in right leg: Secondary | ICD-10-CM | POA: Insufficient documentation

## 2020-10-10 DIAGNOSIS — Z87891 Personal history of nicotine dependence: Secondary | ICD-10-CM | POA: Insufficient documentation

## 2020-10-10 DIAGNOSIS — Z8505 Personal history of malignant neoplasm of liver: Secondary | ICD-10-CM | POA: Insufficient documentation

## 2020-10-10 DIAGNOSIS — Z981 Arthrodesis status: Secondary | ICD-10-CM | POA: Diagnosis not present

## 2020-10-10 DIAGNOSIS — Z7984 Long term (current) use of oral hypoglycemic drugs: Secondary | ICD-10-CM | POA: Diagnosis not present

## 2020-10-10 DIAGNOSIS — R519 Headache, unspecified: Secondary | ICD-10-CM | POA: Diagnosis not present

## 2020-10-10 DIAGNOSIS — F0390 Unspecified dementia without behavioral disturbance: Secondary | ICD-10-CM | POA: Diagnosis not present

## 2020-10-10 DIAGNOSIS — M79622 Pain in left upper arm: Secondary | ICD-10-CM | POA: Diagnosis not present

## 2020-10-10 LAB — COMPREHENSIVE METABOLIC PANEL
ALT: 50 U/L — ABNORMAL HIGH (ref 0–44)
AST: 54 U/L — ABNORMAL HIGH (ref 15–41)
Albumin: 2.8 g/dL — ABNORMAL LOW (ref 3.5–5.0)
Alkaline Phosphatase: 77 U/L (ref 38–126)
Anion gap: 8 (ref 5–15)
BUN: 9 mg/dL (ref 8–23)
CO2: 23 mmol/L (ref 22–32)
Calcium: 8.9 mg/dL (ref 8.9–10.3)
Chloride: 107 mmol/L (ref 98–111)
Creatinine, Ser: 1.03 mg/dL (ref 0.61–1.24)
GFR, Estimated: >60 mL/min (ref >60)
Glucose, Bld: 224 mg/dL — ABNORMAL HIGH (ref 70–99)
Potassium: 4 mmol/L (ref 3.5–5.1)
Sodium: 138 mmol/L (ref 135–145)
Total Bilirubin: 1.6 mg/dL — ABNORMAL HIGH (ref 0.3–1.2)
Total Protein: 7 g/dL (ref 6.5–8.1)

## 2020-10-10 LAB — CBC WITH DIFFERENTIAL/PLATELET
Abs Immature Granulocytes: 0.01 10*3/uL (ref 0.00–0.07)
Basophils Absolute: 0 10*3/uL (ref 0.0–0.1)
Basophils Relative: 1 %
Eosinophils Absolute: 0 10*3/uL (ref 0.0–0.5)
Eosinophils Relative: 2 %
HCT: 33.2 % — ABNORMAL LOW (ref 39.0–52.0)
Hemoglobin: 10.5 g/dL — ABNORMAL LOW (ref 13.0–17.0)
Immature Granulocytes: 0 %
Lymphocytes Relative: 21 %
Lymphs Abs: 0.5 10*3/uL — ABNORMAL LOW (ref 0.7–4.0)
MCH: 28.7 pg (ref 26.0–34.0)
MCHC: 31.6 g/dL (ref 30.0–36.0)
MCV: 90.7 fL (ref 80.0–100.0)
Monocytes Absolute: 0.2 10*3/uL (ref 0.1–1.0)
Monocytes Relative: 10 %
Neutro Abs: 1.5 10*3/uL — ABNORMAL LOW (ref 1.7–7.7)
Neutrophils Relative %: 66 %
Platelets: 57 10*3/uL — ABNORMAL LOW (ref 150–400)
RBC: 3.66 MIL/uL — ABNORMAL LOW (ref 4.22–5.81)
RDW: 20.3 % — ABNORMAL HIGH (ref 11.5–15.5)
WBC: 2.3 10*3/uL — ABNORMAL LOW (ref 4.0–10.5)
nRBC: 0 % (ref 0.0–0.2)

## 2020-10-10 LAB — URINALYSIS, ROUTINE W REFLEX MICROSCOPIC
Bilirubin Urine: NEGATIVE
Glucose, UA: 150 mg/dL — AB
Hgb urine dipstick: NEGATIVE
Ketones, ur: NEGATIVE mg/dL
Leukocytes,Ua: NEGATIVE
Nitrite: NEGATIVE
Protein, ur: NEGATIVE mg/dL
Specific Gravity, Urine: 1.014 (ref 1.005–1.030)
pH: 5 (ref 5.0–8.0)
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
## 2020-10-10 NOTE — Medical Student Note (Signed)
[facility] DEPT Provider Student Note For educational purposes for Medical, PA and NP students only and not part of the legal medical record.   CSN: [id number] Arrival date & time: 10/10/20  1244   History   Chief Complaint Chief Complaint  Patient presents with  . Fall    HPI [name] is a 75 y.o. male with PMH of dementia, T2DM, hepatocellular carcinoma, HTN, sleep apnea who presents for fall.   Level 5 Caveat. Son at bedside and chart review used for HPI. Patient able to provide limited information. Son states that patient fell at home yesterday. He was in the bathroom and son left him and closed the door. States he heard a crash shortly after leaving the room. When he walked in patient was in the bathtub with shower curtain on top of him. He was conscious when son walked in. Son does not believe he had LOC and thinks he fell backward into the tub. Did not see any blood. Not anticoagulated. He has had previous falls. What prompted ED visit this evening was complaints of bilateral lower extremity pain when walking down the steps. Patient denies pain at time of exam. Denies abdominal pain or urinary symptoms. Appetite has been normal. Denies fever, cough.  He was seen 10/05/2020 at [facility] for hallucinations. Work-up was unremarkable. Son states he was living alone prior to [facility] visit receiving home health MWF, and since visit has been staying with him.  He states that patient still actively has [facility] and sees oncology at [facility]. He receives intermittent blood transfusions. Last transfusion was 2 weeks ago, infusion before that was in November. Son states that long-term, patient living with him is not a good fit, as he works third shift.   Past Medical History:  Diagnosis Date  . Cancer [facility])    hepatocellular cancer   . Diabetes mellitus without complication ([place])   . Dyspnea   . Hepatitis B   . Hyperlipidemia   . Hypertension   . Irregular heart beat 01/28/2018   . Narcolepsy    per office visit note of 08/2011   . Sleep apnea    cpap    Patient Active Problem List   Diagnosis Date Noted  . Psychosis ([place]) 10/06/2020  . Iron deficiency anemia 11/04/2018  . Orthostatic hypotension 08/06/2018  . Hypertension 08/06/2018  . Hyperlipidemia 08/06/2018  . Pancytopenia ([place]) 08/06/2018  . 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 Insufficient treatment with nasal CPAP 05/05/2018  . 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 Irregular heart beat 01/28/2018  . Noncompliance with CPAP treatment 08/14/2017  . Excessive daytime sleepiness 04/09/2017  . Snoring 04/09/2017  . Memory impairment of gradual onset 04/09/2017  . Aortic atherosclerosis ([place]) 11/27/2016  . 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 Altered mental status 03/05/2013  . Respiratory failure, post-operative ([place]) 03/02/2013  . Hepatitis B 02/24/2013  . Cancer, hepatocellular ([place]) 02/10/2013  . Liver tumor-bleeding 01/15/2013  . Epidermal cyst 06/18/2012  . OSA (obstructive sleep apnea) 03/13/2012  . Abnormal leg movement 12/12/2011  . Blood pressure elevated 09/06/2011  . Diabetes mellitus ([place]) 09/06/2011  . 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 BPH (benign prostatic hyperplasia) 09/06/2011  . Diverticula of colon 09/06/2011    Past Surgical History:  Procedure Laterality Date  . CHOLECYSTECTOMY N/A 01/28/2018   Procedure: LAPAROSCOPIC CHOLECYSTECTOMY;  Surgeon: [name], MD;  Location: [facility];  Service: General;  Laterality:  N/A;  . COLONOSCOPY WITH PROPOFOL N/A 08/27/2017   Procedure: COLONOSCOPY WITH PROPOFOL;  Surgeon: Clarene Essex, MD;  Location: [facility];  Service: Endoscopy;  Laterality: N/A;  . ESOPHAGOGASTRODUODENOSCOPY (EGD) WITH PROPOFOL N/A 02/23/2020   Procedure: ESOPHAGOGASTRODUODENOSCOPY (EGD) WITH PROPOFOL;  Surgeon: Clarene Essex, MD;  Location: [facility];  Service: Endoscopy;   Laterality: N/A;  . HOT HEMOSTASIS N/A 08/27/2017   Procedure: HOT HEMOSTASIS (ARGON PLASMA COAGULATION/BICAP);  Surgeon: Clarene Essex, MD;  Location: [facility];  Service: Endoscopy;  Laterality: N/A;  . LAPAROSCOPIC CHOLECYSTECTOMY  01/28/2018  . LAPAROSCOPY  03/02/2013   Procedure: LAPAROSCOPY DIAGNOSTIC;  Surgeon: [name], MD;  Location: [facility];  Service: General;;  . LIVER ULTRASOUND  03/02/2013   Procedure: LIVER ULTRASOUND;  Surgeon: [name], MD;  Location: [facility];  Service: General;;  . OPEN PARTIAL HEPATECTOMY   03/02/2013   Procedure: OPEN PARTIAL HEPATECTOMY [83];  Surgeon: [name], MD;  Location: [facility];  Service: General;;  DX LAPAROSCOPY, INTRAOPERATIVE LIVER ULTRASOUND, OPEN PARTIAL HEPATECTOMY  . pinched nerve in back      Home Medications    Prior to Admission medications   Medication Sig Start Date End Date Taking? Authorizing Provider  alfuzosin (UROXATRAL) 10 MG 24 hr tablet Take 10 mg by mouth at bedtime.    [provider]  donepezil (ARICEPT) 10 MG tablet Take 1 tablet (10 mg total) by mouth at bedtime. 06/09/20   Ward Givens, NP  ferrous sulfate 325 (65 FE) MG tablet Take 325 mg by mouth 3 (three) times daily with meals.    [provider]  glipiZIDE (GLUCOTROL) 5 MG tablet Take 1 tablet (5 mg total) by mouth daily before breakfast. 01/08/20   Wendie Agreste, MD  HYDROcodone-acetaminophen (NORCO/VICODIN) 5-325 MG tablet Take 1 tablet by mouth every 6 (six) hours as needed for moderate pain.    [provider]  metFORMIN (GLUCOPHAGE) 500 MG tablet Take 1 tablet (500 mg total) by mouth 2 (two) times daily with a meal. 01/08/20   Wendie Agreste, MD  Multiple Vitamin (MULTIVITAMIN WITH MINERALS) TABS tablet Take 1 tablet by mouth daily.    [provider]  ondansetron (ZOFRAN-ODT) 4 MG disintegrating tablet Take 4 mg by mouth every 8 (eight) hours as needed for nausea or vomiting.    [provider]   pravastatin (PRAVACHOL) 20 MG tablet Take 1 tablet (20 mg total) by mouth at bedtime. 01/08/20   Wendie Agreste, MD  psyllium (KLS NATURAL PSYLLIUM FIBER) 58.6 % powder Take 1 packet by mouth daily.    [provider]  sertraline (ZOLOFT) 25 MG tablet Take 25 mg by mouth daily.    [provider]  spironolactone (ALDACTONE) 25 MG tablet Take 1 tablet (25 mg total) by mouth daily. 01/08/20   Wendie Agreste, MD    Family History Family History  Problem Relation Age of Onset  . Dementia Mother   . Cancer Father   . Diabetes Brother   . Hypertension Brother   . Cancer Brother     Social History Social History   Tobacco Use  . Smoking status: Former Smoker    Years: 5.00    Types: Cigarettes  . Smokeless tobacco: Never Used  Vaping Use  . Vaping Use: Never used  Substance Use Topics  . Alcohol use: No  . Drug use: No   Allergies   Ace inhibitors   Review of Systems Review of Systems  Constitutional: Negative.  HENT: Negative.   Eyes: Negative.   Respiratory: Negative.   Cardiovascular: Negative.   Gastrointestinal: Negative.   Endocrine: Negative.   Genitourinary: Negative.   Musculoskeletal: Negative.   Skin: Negative.   Allergic/Immunologic: Negative.   Neurological: Negative.   Hematological: Negative.   Psychiatric/Behavioral: Positive for hallucinations.     Physical Exam Updated Vital Signs BP 100/79 (BP Location: Right Arm)   Pulse 75   Temp 98.3 F (36.8 C) (Oral)   Resp 14   SpO2 100%   Physical Exam Vitals and nursing note reviewed.  HENT:     Head: Normocephalic and atraumatic.     Mouth/Throat:     Mouth: Mucous membranes are dry.  Eyes:     Extraocular Movements: Extraocular movements intact.     Pupils: Pupils are equal, round, and reactive to light.  Cardiovascular:     Rate and Rhythm: Normal rate and regular rhythm.     Pulses: Normal pulses.     Heart sounds: Normal heart sounds.  Pulmonary:     Effort:  Pulmonary effort is normal.     Breath sounds: Normal breath sounds.  Abdominal:     General: Bowel sounds are normal.     Palpations: Abdomen is soft.     Tenderness: There is no abdominal tenderness.  Musculoskeletal:        General: No swelling or tenderness. Normal range of motion.     Cervical back: Normal range of motion and neck supple. No tenderness.  Skin:    Capillary Refill: Capillary refill takes less than 2 seconds.     Findings: Bruising present.     Comments: Bruise on LUE  Neurological:     General: No focal deficit present.     Mental Status: He is alert. Mental status is at baseline.  Psychiatric:        Mood and Affect: Mood normal.     Comments: Son endorses hallucinations at home    ED Treatments / Results  Labs (all labs ordered are listed, but only abnormal results are displayed) Labs Reviewed  COMPREHENSIVE METABOLIC PANEL - Abnormal; Notable for the following components:      Result Value   Glucose, Bld 224 (*)    Albumin 2.8 (*)    AST 54 (*)    ALT 50 (*)    Total Bilirubin 1.6 (*)    All other components within normal limits  CBC WITH DIFFERENTIAL/PLATELET - Abnormal; Notable for the following components:   WBC 2.3 (*)    RBC 3.66 (*)    Hemoglobin 10.5 (*)    HCT 33.2 (*)    RDW 20.3 (*)    Platelets 57 (*)    Neutro Abs 1.5 (*)    Lymphs Abs 0.5 (*)    All other components within normal limits  URINALYSIS, ROUTINE W REFLEX MICROSCOPIC - Abnormal; Notable for the following components:   Glucose, UA 150 (*)    All other components within normal limits   EKG  Radiology DG Chest 2 View  Result Date: 10/10/2020 CLINICAL DATA:  Altered mental status with fall EXAM: CHEST - 2 VIEW COMPARISON:  October 05, 2020 FINDINGS: There is interstitial thickening. There is no frank edema or consolidation. Heart size and pulmonary vascularity are normal. No adenopathy. There is aortic atherosclerosis. No bone lesions. No pneumothorax. IMPRESSION:  Relative interstitial thickening likely is indicative of a degree of chronic bronchitis. No edema or consolidation. Heart size within normal limits. Aortic Atherosclerosis (ICD10-I70.0). Electronically  Signed   By: Lowella Grip III M.D.   On: 10/10/2020 17:23   CT Head Wo Contrast  Result Date: 10/10/2020 CLINICAL DATA:  Head trauma, minor. Additional history provided: Unwitnessed fall which occurred yesterday. EXAM: CT HEAD WITHOUT CONTRAST CT CERVICAL SPINE WITHOUT CONTRAST TECHNIQUE: Multidetector CT imaging of the head and cervical spine was performed following the standard protocol without intravenous contrast. Multiplanar CT image reconstructions of the cervical spine were also generated. COMPARISON:  Head CT 10/05/2020.  CT cervical spine 11/12/2016. FINDINGS: CT HEAD FINDINGS Brain: Mild cerebral and cerebellar atrophy. Redemonstrated mild patchy and ill-defined hypoattenuation within the cerebral white matter which is nonspecific, but compatible with chronic small vessel ischemic disease. There is no acute intracranial hemorrhage. No demarcated cortical infarct. No extra-axial fluid collection. No evidence of intracranial mass. No midline shift. Vascular: No hyperdense vessel.  Atherosclerotic calcifications Skull: Normal. Negative for fracture or focal lesion. Sinuses/Orbits: Visualized orbits show no acute finding. No significant paranasal sinus disease. CT CERVICAL SPINE FINDINGS Alignment: Straightening of the expected cervical lordosis. 1-2 mm C2-C3 grade 1 retrolisthesis, unchanged. Skull base and vertebrae: The basion-dental and atlanto-dental intervals are maintained.No evidence of acute fracture to the cervical spine. Redemonstrated sequela of prior C5-C6 fusion. Soft tissues and spinal canal: No prevertebral fluid or swelling. No visible canal hematoma. Disc levels: Cervical spondylosis with multilevel disc bulges, endplate spurring and uncovertebral hypertrophy. Central disc protrusions  are present at C2-C3, C3-C4, C4-C5. 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 Multilevel spinal canal stenosis. At C6-C7, a disc bulge and endplate spurring contribute to suspected at least moderate spinal canal stenosis. Multilevel bony neural foraminal narrowing greatest bilaterally at C4-C5 and on the right at C7-T1. Notably, disc space narrowing is severe at C4-C5 and moderate/severe at C6-C7 and C7-T1. Upper chest: No consolidation within the imaged lung apices. No visible pneumothorax. Other: Multiple thyroid nodules measuring up to 12 mm, not meeting consensus criteria for ultrasound follow-up. IMPRESSION: CT head: 1. No evidence of acute intracranial abnormality. 2. Redemonstrated generalized atrophy of the brain and chronic small vessel ischemic disease. CT cervical spine: 1. No evidence of acute fracture to the cervical spine. 2. Nonspecific straightening of the expected cervical lordosis. 3. 1-2 mm C2-C3 grade 1 retrolisthesis, unchanged. 4. Redemonstrated sequela of prior C5-C6 fusion. 5. Cervical spondylosis as described with multilevel spinal canal and neural foraminal narrowing. Suspect at least moderate spinal canal stenosis at C6-C7. Electronically Signed   By: Kellie Simmering DO   On: 10/10/2020 18:12   CT Cervical Spine Wo Contrast  Result Date: 10/10/2020 CLINICAL DATA:  Head trauma, minor. Additional history provided: Unwitnessed fall which occurred yesterday. EXAM: CT HEAD WITHOUT CONTRAST CT CERVICAL SPINE WITHOUT CONTRAST TECHNIQUE: Multidetector CT imaging of the head and cervical spine was performed following the standard protocol without intravenous contrast. Multiplanar CT image reconstructions of the cervical spine were also generated. COMPARISON:  Head CT 10/05/2020.  CT cervical spine 11/12/2016. FINDINGS: CT HEAD FINDINGS Brain: Mild cerebral and cerebellar atrophy. Redemonstrated mild patchy and ill-defined hypoattenuation within the cerebral white matter which  is nonspecific, but compatible with chronic small vessel ischemic disease. There is no acute intracranial hemorrhage. No demarcated cortical infarct. No extra-axial fluid collection. No evidence of intracranial mass. No midline shift. Vascular: No hyperdense vessel.  Atherosclerotic calcifications Skull: Normal. Negative for fracture or focal lesion. Sinuses/Orbits: Visualized orbits show no acute finding. No significant paranasal sinus disease. CT CERVICAL SPINE FINDINGS Alignment: Straightening of the expected cervical lordosis. 1-2 mm  C2-C3 grade 1 retrolisthesis, unchanged. Skull base and vertebrae: The basion-dental and atlanto-dental intervals are maintained.No evidence of acute fracture to the cervical spine. Redemonstrated sequela of prior C5-C6 fusion. Soft tissues and spinal canal: No prevertebral fluid or swelling. No visible canal hematoma. Disc levels: Cervical spondylosis with multilevel disc bulges, endplate spurring and uncovertebral hypertrophy. Central disc protrusions are present at C2-C3, C3-C4, C4-C5. 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 Multilevel spinal canal stenosis. At C6-C7, a disc bulge and endplate spurring contribute to suspected at least moderate spinal canal stenosis. Multilevel bony neural foraminal narrowing greatest bilaterally at C4-C5 and on the right at C7-T1. Notably, disc space narrowing is severe at C4-C5 and moderate/severe at C6-C7 and C7-T1. Upper chest: No consolidation within the imaged lung apices. No visible pneumothorax. Other: Multiple thyroid nodules measuring up to 12 mm, not meeting consensus criteria for ultrasound follow-up. IMPRESSION: CT head: 1. No evidence of acute intracranial abnormality. 2. Redemonstrated generalized atrophy of the brain and chronic small vessel ischemic disease. CT cervical spine: 1. No evidence of acute fracture to the cervical spine. 2. Nonspecific straightening of the expected cervical lordosis. 3. 1-2 mm  C2-C3 grade 1 retrolisthesis, unchanged. 4. Redemonstrated sequela of prior C5-C6 fusion. 5. Cervical spondylosis as described with multilevel spinal canal and neural foraminal narrowing. Suspect at least moderate spinal canal stenosis at C6-C7. Electronically Signed   By: Kellie Simmering DO   On: 10/10/2020 18:12   DG Humerus Left  Result Date: 10/10/2020 CLINICAL DATA:  Pain following fall EXAM: LEFT HUMERUS - 2+ VIEW COMPARISON:  None. FINDINGS: Frontal and lateral views were obtained. No fracture or dislocation. Joint spaces appear normal. No erosive change. IMPRESSION: No fracture or dislocation.  No evident arthropathy. Electronically Signed   By: Lowella Grip III M.D.   On: 10/10/2020 17:24    Procedures Procedures (including critical care time)  Medications Ordered in ED Medications - No data to display   Initial Impression / Assessment and Plan / ED Course  I have reviewed the triage vital signs and the nursing notes.  Pertinent labs & imaging results that were available during my care of the patient were reviewed by me and considered in my medical decision making (see chart for details).  Doni Widmer is a 29yoM with PMH and HPI listed above. Differential diagnosis includes syncope, mechanical fall, CVA, anemia, infectious etiology of fall.   Unlikely to be CVA. No focal neurological deficits on exam. Baseline for dementia. Will obtain CT head.  Unlikely infectious etiology. No complaints of abdominal, urinary, pulmonary symptoms. Will obtain UA to r/o UTI related delirium. Unlikely syncope. No arrhythmia, patient had no LOC per son. No complaint of palpitation.  History of what sounds like anemia so will obtain CBC to rule out infection and anemia.   Likely mechanical fall from standing. Obtaining CT head, neck, XR LUE due to bruising to rule out acute concerns. Will contact social work to assist with placement or home health at son's request.  - imaging without acute  abnormalities - pancytopenia appears chronic r/t HCC  - hemoglobin 10.5 which is baseline.   Final Clinical Impressions(s) / ED Diagnoses   Final diagnoses:  Fall    New Prescriptions New Prescriptions   No medications on file

## 2020-10-10 NOTE — Progress Notes (Signed)
   10/10/20 1715  TOC ED Mini Assessment  TOC Time spent with patient (minutes): 15  PING Used in TOC Assessment Yes  Admission or Readmission Diverted Yes  Interventions which prevented an admission or readmission [facility] or Services  What brought you to the Emergency Department?  S/P Mechanical Fall at home  Barrier interventions Patient lives at home with son Etta Grandchild [phone].  Patient has been declining over the past couple of months as per son.  Discussed HHPT/OT for reconditioning. Patient has [employer] they have no prefernce ED CM will send referral to [facility] for [facility] servcies. Patient's son works 3rd shift and it has been difficult, but he is willi They were trying to keep patient at home for as long as they could but are now to the point of exploring ALF/ LCF.  Patient and family are active with [facility]  [facility] has accepted the referral.  Updated EDP on plan. No further ED CM needs identified.

## 2020-10-10 NOTE — ED Notes (Signed)
Patient verbalizes understanding of discharge instructions. Opportunity for questioning and answers were provided. Armband removed by staff, pt discharged from ED.  

## 2020-10-10 NOTE — Discharge Instructions (Addendum)
Your work-up today was reassuring.  As we discussed, social work has gotten involved and will continue to manage of your case. We have consulted for PT/OT to continue helping at home in the meantime.   Follow up with your primary care doctor.   Return to the Emergency Dept for any worsening pain, difficulty walking or any other worsening or concerning symptoms.

## 2020-10-10 NOTE — ED Triage Notes (Signed)
Pt arrives to ED with son with chief complaint of unwitnessed fall that occurred in the restroom yesterday. The patient has dementia and has no complaints but son concerned if he hit his head.

## 2020-10-11 ENCOUNTER — Telehealth: Payer: Self-pay | Admitting: Family Medicine

## 2020-10-11 NOTE — Telephone Encounter (Signed)
10/11/2020 - PATIENT'S SON (JAMES LATTA) DROPPED OFF FL2 FORMS FOR DR. Carlota Raspberry TO FILL OUT. PLEASE CALL HIS SON AT [phone] WHEN THEY ARE READY. I WILL PLACE THE FORMS AT THE NURSE'S STATION IN DR. Vonna Kotyk CU[place] HOLE FOR REVIEW BY [place]. [place]

## 2020-10-11 NOTE — Telephone Encounter (Signed)
Filled in some of these forms placed in your to be signed box in providers lounge. Clean copies available if needed

## 2020-10-12 ENCOUNTER — Telehealth: Payer: Self-pay | Admitting: Family Medicine

## 2020-10-12 NOTE — Telephone Encounter (Signed)
Home Health Verbal Orders - Caller/Agency: Constance Haw PT, [facility] Callback Number: [phone] Requesting OT/PT/Skilled Nursing/Social Work/Speech Therapy: Trying to reach patient to schedule home health PT eval, contacted pt and son but have yet to receive correspondence from either. Delay of start of care, due to inability to reach patient. Frequency:

## 2020-10-13 NOTE — Telephone Encounter (Signed)
Pt son [name] is calling checking on status of  FL2 form .

## 2020-10-13 NOTE — Telephone Encounter (Signed)
Spoke with Suanne Marker. She spoke with [facility] yesterday - message left with Karen Kitchens plans on calling [facility].

## 2020-10-13 NOTE — Telephone Encounter (Addendum)
10/13/2020 - MR. Schwake'S NIECE (RHONDA Winnie) CALLED TO SEE IF IT WAS POSSIBLE FOR DR. Carlota Raspberry TO HAVE THE FL2 PAPERWORK FILLED OUT BY Friday 10/14/2020. I SPOKE WITH DR. Carlota Raspberry AND HE TOLD ME TO PUT IN A PHONE MESSAGE AND HE WOULD TRY TO COMPLETE THEM TONIGHT AFTER HIS PATIENTS. RHONDA'S PHONE IS [phone] - PLEASE CALL HER FOR PICK-UP WHEN READY (WE HAVE THE POWER OF ATTORNEY HIPAA ON FILE) [place]

## 2020-10-13 NOTE — Telephone Encounter (Signed)
[employer].  Rainey is staying with [name] at this time.  SHe saw Jemiah on Saturday. She has talked to county Education officer, museum and recommended memory care facility. Plan for nursing facility.  Will send a message to Ward Givens with neuro for input on current symptoms and assisting with plan of care.  FL2 completion started. Will need hospital follow up - will route to scheduling.

## 2020-10-13 NOTE — Telephone Encounter (Signed)
FYI

## 2020-10-14 NOTE — Telephone Encounter (Signed)
Hospital follow up scheduled for Monday, 2/28 with provider.

## 2020-10-17 ENCOUNTER — Ambulatory Visit (INDEPENDENT_AMBULATORY_CARE_PROVIDER_SITE_OTHER): Payer: Medicare Other | Admitting: Family Medicine

## 2020-10-17 ENCOUNTER — Encounter: Payer: Self-pay | Admitting: Family Medicine

## 2020-10-17 ENCOUNTER — Telehealth: Payer: Self-pay

## 2020-10-17 ENCOUNTER — Telehealth: Payer: Self-pay | Admitting: *Deleted

## 2020-10-17 ENCOUNTER — Other Ambulatory Visit: Payer: Self-pay

## 2020-10-17 VITALS — BP 157/77 | HR 78 | Temp 98.1°F | Ht 69.0 in | Wt 183.0 lb

## 2020-10-17 DIAGNOSIS — D649 Anemia, unspecified: Secondary | ICD-10-CM

## 2020-10-17 DIAGNOSIS — R7989 Other specified abnormal findings of blood chemistry: Secondary | ICD-10-CM | POA: Diagnosis not present

## 2020-10-17 DIAGNOSIS — F0391 Unspecified dementia with behavioral disturbance: Secondary | ICD-10-CM | POA: Diagnosis not present

## 2020-10-17 DIAGNOSIS — C220 Liver cell carcinoma: Secondary | ICD-10-CM

## 2020-10-17 DIAGNOSIS — F0392 Unspecified dementia, unspecified severity, with psychotic disturbance: Secondary | ICD-10-CM

## 2020-10-17 NOTE — Patient Instructions (Addendum)
  Check home meds - should be on spirinolactone once per day for blood pressure.  Bring other meds to next visit.  I sent a message to social worker, and will try to discuss other meds with neuro as well.  Return to the clinic or go to the nearest emergency room if any of your symptoms worsen or new symptoms occur.   If you have lab work done today you will be contacted with your lab results within the next 2 weeks.  If you have not heard from [place] then please contact us. The fastest way to get your results is to register for My Chart.   IF you received an x-ray today, you will receive an invoice from [facility]. Please contact [facility] at [phone] with questions or concerns regarding your invoice.   IF you received labwork today, you will receive an invoice from [facility]. Please contact [facility] at [phone] with questions or concerns regarding your invoice.   Our billing staff will not be able to assist you with questions regarding bills from these companies.  You will be contacted with the lab results as soon as they are available. The fastest way to get your results is to activate your My Chart account. Instructions are located on the last page of this paperwork. If you have not heard from [place] regarding the results in 2 weeks, please contact this office.

## 2020-10-17 NOTE — Telephone Encounter (Signed)
Tried to call and reach pt to schedule appt.

## 2020-10-17 NOTE — Progress Notes (Signed)
Subjective:  Patient ID: [name], male    DOB: 1946-02-23  Age: 75 y.o. MRN: [id number]  CC:  Chief Complaint  Patient presents with  . Hospitalization Follow-up    Pt went to the hospital after last OV at providers request. Pts family reports no change to pts condition since last OV. Pt's family reports the pt is seeing people who aren't there. Pt reported seeing People, snakes, and cenitars. Pt's family reports they went through his medication at home and noticed medications missing that are on his medication list, and want to know what exactly he should be taken.Pt's family didn't see the Zoloft,Pravastatin,Metformin,Glipizide, or ferrous sulfate.    HPI [name] presents for  Hospital follow-up. Here with family members today - [name] and [name].   Last seen February 16, concern for possible delirium at that time.  Was sent to ER for evaluation.  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   AST elevated at 142, ALT 95.  Bilirubin 1.9.  Urinalysis with negative nitrite/LE, 500 glucose.  UDS positive for amphetamines only Hemoglobin 10.1, stable from previous readings and up from 7.5 few weeks ago.  Negative Covid testing, ethanol negative chest x-ray no active disease CT head no acute findings, mild age-related volume loss mild chronic small vessel ischemic changes.  CBG 159.   Due to his symptoms thought not to be safe to live by self at home.  Has been staying with son [name].  See other notes, FL 2 completed last week for nursing care.  Also sent message to his previous memory specialist regarding his dementia and recent changes.  Has continued on donepezil 10 mg daily.  Some concern regarding medication adherence at home previously.  Concerns as above. Metformin and 2 other pills at home. Has FL2 form completed. Has not contacted any facility yet. Rhonda planning on coordinating with medicaid. Has county Education officer, museum - Ms. Baker Janus. Prior concern of aggression with  aide? Home health not able to come out further.    Wearing hearing aids, but still some difficulty hearing. Still having hallucinations- now snakes, centaurs, 5 other people. Some things knocked over - he tried to fight these hallucinations. Not physically abusive to others.  On discussion with [name], he had been blocking off his backyard previously - Fredrich states this is due to neighbors coming into his yard.  Fell when running through field - no injury.  Most recent fall 8 days ago in bathroom - seen in ER 2/21 Ct head, c spine and CXR without acute abnormality. Had bruise, but negative humerus XR. HGB was at baseline, U/A negative for infection.  Hx of hepatocellular CA. Recent LFTs improve.   Denies visual hallucinations here or at shop with [name], has hallucinations at home only (and [name]' home)   Feels fine currently. Denies pain.  Eating 3 meals per day currently.  Last rx dextroamphetamine filled in July 2021 (treated for narcolepsy).    Lab Results  Component Value Date   TSH 1.030 10/17/2020    Lab Results  Component Value Date   WBC 2.9 (L) 10/17/2020   HGB 11.2 (L) 10/17/2020   HCT 34.4 (L) 10/17/2020   MCV 88 10/17/2020   PLT 81 (LL) 10/17/2020   Lab Results  Component Value Date   ALT 45 (H) 10/17/2020   AST 53 (H) 10/17/2020   ALKPHOS 110 10/17/2020   BILITOT 1.4 (H) 10/17/2020   Lab Results  Component Value Date   HGBA1C 7.3 (  H) 09/01/2020      History Patient Active Problem List   Diagnosis Date Noted  . Psychosis ([place]) 10/06/2020  . Iron deficiency anemia 11/04/2018  . Orthostatic hypotension 08/06/2018  . Hypertension 08/06/2018  . Hyperlipidemia 08/06/2018  . Pancytopenia ([place]) 08/06/2018  . 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 Insufficient treatment with nasal CPAP 05/05/2018  . 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 Irregular heart beat 01/28/2018  . Noncompliance with  CPAP treatment 08/14/2017  . Excessive daytime sleepiness 04/09/2017  . Snoring 04/09/2017  . Memory impairment of gradual onset 04/09/2017  . Aortic atherosclerosis ([place]) 11/27/2016  . 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 Altered mental status 03/05/2013  . Respiratory failure, post-operative ([place]) 03/02/2013  . Hepatitis B 02/24/2013  . Cancer, hepatocellular ([place]) 02/10/2013  . Liver tumor-bleeding 01/15/2013  . Epidermal cyst 06/18/2012  . OSA (obstructive sleep apnea) 03/13/2012  . Abnormal leg movement 12/12/2011  . Blood pressure elevated 09/06/2011  . Diabetes mellitus ([place]) 09/06/2011  . 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 BPH (benign prostatic hyperplasia) 09/06/2011  . Diverticula of colon 09/06/2011   Past Medical History:  Diagnosis Date  . Cancer [facility])    hepatocellular cancer   . Diabetes mellitus without complication ([place])   . Dyspnea   . Hepatitis B   . Hyperlipidemia   . Hypertension   . Irregular heart beat 01/28/2018  . Narcolepsy    per office visit note of 08/2011   . Sleep apnea    cpap   Past Surgical History:  Procedure Laterality Date  . CHOLECYSTECTOMY N/A 01/28/2018   Procedure: LAPAROSCOPIC CHOLECYSTECTOMY;  Surgeon: [name], MD;  Location: [facility];  Service: General;  Laterality: N/A;  . COLONOSCOPY WITH PROPOFOL N/A 08/27/2017   Procedure: COLONOSCOPY WITH PROPOFOL;  Surgeon: Clarene Essex, MD;  Location: [facility];  Service: Endoscopy;  Laterality: N/A;  . ESOPHAGOGASTRODUODENOSCOPY (EGD) WITH PROPOFOL N/A 02/23/2020   Procedure: ESOPHAGOGASTRODUODENOSCOPY (EGD) WITH PROPOFOL;  Surgeon: Clarene Essex, MD;  Location: [facility];  Service: Endoscopy;  Laterality: N/A;  . HOT HEMOSTASIS N/A 08/27/2017   Procedure: HOT HEMOSTASIS (ARGON PLASMA COAGULATION/BICAP);  Surgeon: Clarene Essex, MD;  Location: [facility];  Service: Endoscopy;  Laterality: N/A;  . LAPAROSCOPIC CHOLECYSTECTOMY  01/28/2018  . LAPAROSCOPY  03/02/2013    Procedure: LAPAROSCOPY DIAGNOSTIC;  Surgeon: [name], MD;  Location: [facility];  Service: General;;  . LIVER ULTRASOUND  03/02/2013   Procedure: LIVER ULTRASOUND;  Surgeon: [name], MD;  Location: [facility];  Service: General;;  . OPEN PARTIAL HEPATECTOMY   03/02/2013   Procedure: OPEN PARTIAL HEPATECTOMY [83];  Surgeon: [name], MD;  Location: [facility];  Service: General;;  DX LAPAROSCOPY, INTRAOPERATIVE LIVER ULTRASOUND, OPEN PARTIAL HEPATECTOMY  . pinched nerve in back     Allergies  Allergen Reactions  . Ace Inhibitors Swelling and Other (See Comments)    Angioedema - face.    Prior to Admission medications   Medication Sig Start Date End Date Taking? Authorizing Provider  alfuzosin (UROXATRAL) 10 MG 24 hr tablet Take 10 mg by mouth at bedtime.   Yes [provider]  donepezil (ARICEPT) 10 MG tablet Take 1 tablet (10 mg total) by mouth at bedtime. 06/09/20  Yes Ward Givens, NP  HYDROcodone-acetaminophen (NORCO/VICODIN) 5-325 MG tablet Take 1 tablet by mouth every 6 (six) hours as needed for moderate pain.   Yes [provider]  Multiple Vitamin (MULTIVITAMIN WITH  MINERALS) TABS tablet Take 1 tablet by mouth daily.   Yes [provider]  pravastatin (PRAVACHOL) 20 MG tablet Take 1 tablet (20 mg total) by mouth at bedtime. 01/08/20  Yes Wendie Agreste, MD  psyllium (KLS NATURAL PSYLLIUM FIBER) 58.6 % powder Take 1 packet by mouth daily.   Yes [provider]  spironolactone (ALDACTONE) 25 MG tablet Take 1 tablet (25 mg total) by mouth daily. 01/08/20  Yes Wendie Agreste, MD  ferrous sulfate 325 (65 FE) MG tablet Take 325 mg by mouth 3 (three) times daily with meals. Patient not taking: Reported on 10/17/2020    [provider]  glipiZIDE (GLUCOTROL) 5 MG tablet Take 1 tablet (5 mg total) by mouth daily before breakfast. Patient not taking: Reported on 10/17/2020 01/08/20   Wendie Agreste, MD  metFORMIN (GLUCOPHAGE) 500 MG tablet  Take 1 tablet (500 mg total) by mouth 2 (two) times daily with a meal. Patient not taking: Reported on 10/17/2020 01/08/20   Wendie Agreste, MD  ondansetron (ZOFRAN-ODT) 4 MG disintegrating tablet Take 4 mg by mouth every 8 (eight) hours as needed for nausea or vomiting. Patient not taking: Reported on 10/17/2020    [provider]  sertraline (ZOLOFT) 25 MG tablet Take 25 mg by mouth daily. Patient not taking: Reported on 10/17/2020    [provider]   Social History   Socioeconomic History  . Marital status: Divorced    Spouse name: Not on file  . Number of children: 2  . Years of education: 15+  . Highest education level: Bachelor's degree (e.g., BA, AB, BS)  Occupational History  . Occupation: Retired  Tobacco Use  . Smoking status: Former Smoker    Years: 5.00    Types: Cigarettes  . Smokeless tobacco: Never Used  Vaping Use  . Vaping Use: Never used  Substance and Sexual Activity  . Alcohol use: No  . Drug use: No  . Sexual activity: Not Currently  Other Topics Concern  . Not on file  Social History Narrative   Patient is single and lives alone- became widower when 1 child was 50 year old, divorced 2nd marriage   Has #2 grown children, not in area or involved in his life   Patient is retired.   Patient has a college education.   Patient is right-handed.   Patient drinks maybe two sodas daily.   Social Determinants of Health   Financial Resource Strain: Low Risk   . Difficulty of Paying Living Expenses: Not hard at all  Food Insecurity: Food Insecurity Present  . Worried About Charity fundraiser in the Last Year: Sometimes true  . Ran Out of Food in the Last Year: Sometimes true  Transportation Needs: No Transportation Needs  . Lack of Transportation (Medical): No  . Lack of Transportation (Non-Medical): No  Physical Activity: Inactive  . Days of Exercise per Week: 0 days  . Minutes of Exercise per Session: 0 min  Stress: No Stress Concern  Present  . Feeling of Stress : Not at all  Social Connections: Moderately Isolated  . Frequency of Communication with Friends and Family: More than three times a week  . Frequency of Social Gatherings with Friends and Family: More than three times a week  . Attends Religious Services: 1 to 4 times per year  . Active Member of Clubs or Organizations: No  . Attends Archivist Meetings: Never  . Marital Status: Divorced  Human resources officer  Violence: Not At Risk  . Fear of Current or Ex-Partner: No  . Emotionally Abused: No  . Physically Abused: No  . Sexually Abused: No    Review of Systems Per HPI.   Objective:   Vitals:   10/17/20 1114  BP: (!) 157/77  Pulse: 78  Temp: 98.1 F (36.7 C)  TempSrc: Temporal  SpO2: 100%  Weight: 183 lb (83 kg)  Height: 5\' 9"  (1.753 m)     Physical Exam Vitals reviewed.  Constitutional:      Appearance: He is well-developed and well-nourished.  HENT:     Head: Normocephalic and atraumatic.  Eyes:     Extraocular Movements: EOM normal.     Pupils: Pupils are equal, round, and reactive to light.  Neck:     Vascular: No carotid bruit or JVD.  Cardiovascular:     Rate and Rhythm: Normal rate and regular rhythm.     Heart sounds: Normal heart sounds. No murmur heard.   Pulmonary:     Effort: Pulmonary effort is normal.     Breath sounds: Normal breath sounds. No rales.  Musculoskeletal:        General: No edema.     Right lower leg: No edema.     Left lower leg: No edema.  Skin:    General: Skin is warm and dry.  Neurological:     Mental Status: He is alert and oriented to person, place, and time.  Psychiatric:        Mood and Affect: Mood and affect normal.    43 minutes spent during visit, greater than 50% counseling and assimilation of information, chart review, and discussion of plan.    Assessment & Plan:  TORRION WITTER is a 75 y.o. male . 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  Note received from neurology office, they plan on reaching out to patient.  Working on placement options given his current situation, but will remain with son for now.  Plan for an office eval in the next 1 week with his home meds to help sort out what he is and is not taking and adjust accordingly.  Elevated LFTs - Plan: Ammonia, Comprehensive metabolic panel  -Mild elevated LFTs previously, check ammonia but less likely cause of neurologic symptoms.  Cancer, hepatocellular ([place]) - Plan: Ammonia, Comprehensive metabolic panel   -As above, repeat LFTs continue follow-up with gastroenterology.  Oncology.  Anemia, unspecified type - Plan: CBC  -Chronic anemia and thrombocytopenia, overall stable readings from previous labs.  No sign of bleeding at this time.  Hemoglobin improved.  No orders of the defined types were placed in this encounter.  Patient Instructions    Check home meds - should be on spirinolactone once per day for blood pressure.  Bring other meds to next visit.  I sent a message to social worker, and will try to discuss other meds with neuro as well.  Return to the clinic or go to the nearest emergency room if any of your symptoms worsen or new symptoms occur.   If you have lab work done today you will be contacted with your lab results within the next 2 weeks.  If you have not heard from [place] then please contact us. The fastest way to get your results is to register for My Chart.   IF you received  an x-ray today, you will receive an invoice from [facility]. Please contact [facility] at [phone] with questions or concerns regarding your invoice.   IF you received labwork today, you will receive an invoice from [facility]. Please contact [facility] at [phone] with questions or concerns regarding your invoice.   Our  billing staff will not be able to assist you with questions regarding bills from these companies.  You will be contacted with the lab results as soon as they are available. The fastest way to get your results is to activate your My Chart account. Instructions are located on the last page of this paperwork. If you have not heard from [place] regarding the results in 2 weeks, please contact this office.         Signed, [name], MD [facility]

## 2020-10-17 NOTE — Telephone Encounter (Signed)
Home health services called in to report the son of the pt. Had told them the pt. Would not be needing home health services.

## 2020-10-18 ENCOUNTER — Inpatient Hospital Stay: Payer: Medicare Other | Attending: Oncology

## 2020-10-18 ENCOUNTER — Encounter: Payer: Self-pay | Admitting: Family Medicine

## 2020-10-18 ENCOUNTER — Inpatient Hospital Stay: Payer: Medicare Other | Admitting: Oncology

## 2020-10-18 ENCOUNTER — Telehealth: Payer: Self-pay | Admitting: *Deleted

## 2020-10-18 ENCOUNTER — Encounter: Payer: Self-pay | Admitting: *Deleted

## 2020-10-18 DIAGNOSIS — K5730 Diverticulosis of large intestine without perforation or abscess without bleeding: Secondary | ICD-10-CM | POA: Insufficient documentation

## 2020-10-18 DIAGNOSIS — K621 Rectal polyp: Secondary | ICD-10-CM | POA: Insufficient documentation

## 2020-10-18 DIAGNOSIS — D61818 Other pancytopenia: Secondary | ICD-10-CM | POA: Insufficient documentation

## 2020-10-18 DIAGNOSIS — C220 Liver cell carcinoma: Secondary | ICD-10-CM | POA: Insufficient documentation

## 2020-10-18 DIAGNOSIS — D649 Anemia, unspecified: Secondary | ICD-10-CM | POA: Insufficient documentation

## 2020-10-18 DIAGNOSIS — I8510 Secondary esophageal varices without bleeding: Secondary | ICD-10-CM | POA: Insufficient documentation

## 2020-10-18 DIAGNOSIS — K7460 Unspecified cirrhosis of liver: Secondary | ICD-10-CM | POA: Insufficient documentation

## 2020-10-18 DIAGNOSIS — D1779 Benign lipomatous neoplasm of other sites: Secondary | ICD-10-CM | POA: Insufficient documentation

## 2020-10-18 DIAGNOSIS — Z79899 Other long term (current) drug therapy: Secondary | ICD-10-CM | POA: Insufficient documentation

## 2020-10-18 DIAGNOSIS — K5520 Angiodysplasia of colon without hemorrhage: Secondary | ICD-10-CM | POA: Insufficient documentation

## 2020-10-18 DIAGNOSIS — K644 Residual hemorrhoidal skin tags: Secondary | ICD-10-CM | POA: Insufficient documentation

## 2020-10-18 DIAGNOSIS — E119 Type 2 diabetes mellitus without complications: Secondary | ICD-10-CM | POA: Insufficient documentation

## 2020-10-18 DIAGNOSIS — K648 Other hemorrhoids: Secondary | ICD-10-CM | POA: Insufficient documentation

## 2020-10-18 LAB — COMPREHENSIVE METABOLIC PANEL
ALT: 45 IU/L — ABNORMAL HIGH (ref 0–44)
AST: 53 IU/L — ABNORMAL HIGH (ref 0–40)
Albumin/Globulin Ratio: 1 — ABNORMAL LOW (ref 1.2–2.2)
Albumin: 3.7 g/dL (ref 3.7–4.7)
Alkaline Phosphatase: 110 IU/L (ref 44–121)
BUN/Creatinine Ratio: 13 (ref 10–24)
BUN: 13 mg/dL (ref 8–27)
Bilirubin Total: 1.4 mg/dL — ABNORMAL HIGH (ref 0.0–1.2)
CO2: 21 mmol/L (ref 20–29)
Calcium: 9.7 mg/dL (ref 8.6–10.2)
Chloride: 105 mmol/L (ref 96–106)
Creatinine, Ser: 0.98 mg/dL (ref 0.76–1.27)
Globulin, Total: 3.7 g/dL (ref 1.5–4.5)
Glucose: 273 mg/dL — ABNORMAL HIGH (ref 65–99)
Potassium: 4.8 mmol/L (ref 3.5–5.2)
Sodium: 138 mmol/L (ref 134–144)
Total Protein: 7.4 g/dL (ref 6.0–8.5)
eGFR: 80 mL/min/{1.73_m2} (ref >59)

## 2020-10-18 LAB — CBC
Hematocrit: 34.4 % — ABNORMAL LOW (ref 37.5–51.0)
Hemoglobin: 11.2 g/dL — ABNORMAL LOW (ref 13.0–17.7)
MCH: 28.6 pg (ref 26.6–33.0)
MCHC: 32.6 g/dL (ref 31.5–35.7)
MCV: 88 fL (ref 79–97)
Platelets: 81 10*3/uL — CL (ref 150–450)
RBC: 3.92 x10E6/uL — ABNORMAL LOW (ref 4.14–5.80)
RDW: 19.4 % — ABNORMAL HIGH (ref 11.6–15.4)
WBC: 2.9 10*3/uL — ABNORMAL LOW (ref 3.4–10.8)

## 2020-10-18 LAB — TSH: TSH: 1.03 u[IU]/mL (ref 0.450–4.500)

## 2020-10-18 LAB — AMMONIA: Ammonia: 83 ug/dL (ref 31–169)

## 2020-10-18 NOTE — Progress Notes (Signed)
Patient was a "no show" today for lab/OV. Scheduling message sent to reschedule for 1-2 weeks. Call son, [name]

## 2020-10-18 NOTE — Patient Outreach (Signed)
[facility]) Care Management  10/18/2020  [name] [birth year] [id number]  CSW attempted outreach to pt's son today for initial outreach call and assessment.  CSW was unable to reach son, [name], and left a HIPPA compliant voice message.  CSW will await call back and attempt again in 3-4 business days if no return call is received.   [name], MSW, [place] Worker  [place] [phone]

## 2020-10-18 NOTE — Telephone Encounter (Signed)
Pt is living with son, Etta Grandchild.  Spoke to him this am, not able to come this afternoon, another appt conflict.  LMVM for him that made appt with Dr. Brett Fairy 10-20-20 at 1430 arrive 1400, call if this does not work.

## 2020-10-19 ENCOUNTER — Telehealth: Payer: Self-pay | Admitting: Oncology

## 2020-10-19 NOTE — Telephone Encounter (Signed)
Rescheduled appointment per 03/01 schedule message. Contacted son, patient is aware.

## 2020-10-20 ENCOUNTER — Other Ambulatory Visit: Payer: Self-pay

## 2020-10-20 ENCOUNTER — Ambulatory Visit: Payer: Medicare Other | Admitting: Neurology

## 2020-10-20 ENCOUNTER — Encounter: Payer: Self-pay | Admitting: Neurology

## 2020-10-20 VITALS — BP 132/70 | HR 85 | Ht 69.0 in | Wt 180.0 lb

## 2020-10-20 DIAGNOSIS — F05 Delirium due to known physiological condition: Secondary | ICD-10-CM | POA: Diagnosis not present

## 2020-10-20 MED ORDER — QUETIAPINE FUMARATE 25 MG PO TABS: 12.5000 mg | ORAL_TABLET | Freq: Every day | ORAL | 1 refills | Status: DC

## 2020-10-20 MED ORDER — MEMANTINE HCL 5 MG PO TABS: 5.0000 mg | ORAL_TABLET | Freq: Two times a day (BID) | ORAL | 5 refills | Status: DC

## 2020-10-20 MED ORDER — DONEPEZIL HCL 10 MG PO TABS: 10.0000 mg | ORAL_TABLET | Freq: Every day | ORAL | 3 refills | Status: DC

## 2020-10-20 NOTE — Patient Instructions (Signed)
Delirium °Delirium is a state of mental confusion. It comes on quickly and causes significant changes in a person's thinking and behavior. °People with delirium usually have trouble paying attention to what is going on or knowing where they are. They may become very withdrawn or very emotional and unable to sit still. They may even see or feel things that are not there (hallucinations). Delirium is a sign of a serious underlying medical condition. °What are the causes? °Delirium occurs when something suddenly affects the signals that the brain sends out. Brain signals can be affected by anything that puts severe stress on the body and brain and causes brain chemicals to be out of balance. The most common causes of delirium include: °· Infections. These may be bacterial, viral, fungal, or protozoal. °· Medicines. These include many over-the-counter and prescription medicines. °· Recreational drugs. °· Substance withdrawal. This occurs with sudden discontinuation of alcohol, certain medicines, or recreational drugs. °· Surgery and anesthesia. °· Sudden vascular events, such as stroke and brain hemorrhage. °· Other brain disorders, such as migraines, tumors, seizures, and physical head trauma. °· Metabolic disorders, such as kidney or liver failure. °· Low blood oxygen (anoxia). This may occur with lung disease, cardiac arrest, or carbon monoxide poisoning. °· Hormone imbalances (endocrinopathies), such as an overactive thyroid (hyperthyroidism) or underactive thyroid (hypothyroidism). °· Vitamin deficiencies. °What increases the risk? °The following factors may make someone more likely to develop this condition: °· Being a child. °· Being an older person. °· Living alone. °· Having vision loss or hearing loss. °· Having an existing brain disease, such as dementia. °· Having long-lasting (chronic) medical conditions, such as heart disease. °· Being hospitalized for long periods of time. °What are the signs or  symptoms? °Delirium starts with a sudden change in a person's thinking or behavior. Symptoms include: °· Not being able to stay awake (drowsiness) or pay attention. °· Being confused about places, time, and people. °· Forgetfulness. °· Having extreme energy levels. These may be low or high. °· Changes in sleep patterns. °· Extreme mood swings, such as sudden anger or anxiety. °· Focusing on things or ideas that are not important. °· Rambling and senseless talking. °· Difficulty speaking, understanding speech, or both. °· Hallucinations. °· Tremor or unsteady gait. °Symptoms come and go throughout the day and are often worse at the end of the day. °How is this diagnosed? °People with delirium may not realize that they have the condition. Often, a family member or health care provider is the first person to notice the changes. This condition may be diagnosed based on a physical exam, health history, and tests. °· The health care provider will obtain a detailed history. This may include questions about: °? Current symptoms. °? Medical conditions that you have. °? Medicines. °? Drug use. °· The health care provider will perform a mental status test by: °? Asking questions to check for confusion. °? Watching for abnormal behavior. °· The health care provider may also order lab tests or additional studies to determine the cause of the delirium. °How is this treated? °Treatment of delirium depends on the cause and severity. Delirium usually goes away within days or weeks of treating the underlying cause. In the meantime, do not leave the person alone because he or she may accidentally cause self-harm. This condition may be treated with supportive care, such as: °· Increased light during the day and decreased light at night. °· Low noise level. °· Uninterrupted sleep. °· A regular daily   schedule. °· Clocks and calendars to help with orientation. °· Familiar objects, including the person's pictures and clothing. °· Frequent  visits from familiar family and friends. °· A healthy diet. °· Gentle exercise. °In more severe cases of delirium, medicine may be prescribed to help the person keep calm and think more clearly.   °Follow these instructions at home: °· Continue supportive care as told by a health care provider. °· Take over-the-counter and prescription medicines only as told by your health care provider. °· Ask a health care provider before using herbs or supplements. °· Do not use alcohol or illegal drugs. °· Keep all follow-up visits. This is important.   °Contact a health care provider if: °· Symptoms do not get better or they become worse. °· New symptoms of delirium develop. °· Caring for the person at home does not seem safe. °· Eating, drinking, or communicating stops. °· There are side effects of medicines, such as changes in sleep patterns, dizziness, weight gain, restlessness, movement changes, or tremors. °Get help right away if: °· The person has thoughts of harming self or harming others. °· There are serious side effects of medicine, such as: °? Swelling of the face, lips, tongue, or throat. °? Fever, confusion, muscle spasms, or seizures. °If you ever feel like a loved one may hurt himself or herself or others, or shares thoughts about taking his or her own life, get help right away. You can go to your nearest emergency department or: °· Call your local emergency services (911 in the [place] °· Call a suicide crisis helpline, such as the National Suicide Prevention Lifeline at 1-800-273-8255. This is open 24 hours a day in the [place] °· Text the Crisis Text Line at 741741 (in the [place] °Summary °· Delirium is a state of mental confusion. It comes on quickly and causes significant changes in a person's thinking and behavior. °· Delirium is a sign of a serious underlying medical condition. °· Certain medical conditions or a long hospital stay may increase the risk of developing delirium. °· Treatment of delirium involves  treating the underlying cause and providing supportive treatments, such as a calm and familiar environment. °This information is not intended to replace advice given to you by your health care provider. Make sure you discuss any questions you have with your health care provider. °Document Revised: 11/13/2019 Document Reviewed: 11/13/2019 °Elsevier Patient Education © 2021 [employer] ° °

## 2020-10-20 NOTE — Progress Notes (Addendum)
PATIENT: [name] DOB: October 15, 1945  REASON FOR VISIT: follow up HISTORY FROM: patient and son   HISTORY OF PRESENT ILLNESS: Patient with son, room 10. Patient was in hospital 2/16- 18 for increased confusion and having hallucinations DELIRIUM _ never admitted, only in ED-   He has been able to continue to complete ADL without help. Constant confusion. Pt is not consistent with taking medication like suppose to. Living with son who is concerned with steps in the home.  Interval history from 10/20/20:  I have the pleasure of meeting today with [name]. Col[place] and meanwhile 75 year old patient who has been followed here for cognitive decline with memory loss, and his inability to comply with CPAP treatment has led to a discontinuation of therapy, and he carries a diagnosis of excessive daytime sleepiness-narcolepsy.  The patient had in his last visit stated that he left independent and was operating a motor vehicle and was taking his medication as prescribed.  The nurse practitioner was concerned because he had not refilled any of his medications.  He also appeared somewhat confused.  As to his sleep treatments we really do not need to follow-up these anymore the patient at his last sleep study results in August 2018 at that time we looked at an Epworth Sleepiness Scale of 17 out of 24 points abnormal REM latency very short sleep latency he had mild sleep apnea.  We had recommended to treat the sleep apnea with CPAP but he has not been able to comply.  He was titrated in an attended sleep study in September 2018 at 8 cmH2O was 2 cm EPR using an use on nasal mask.  Compliance data show that he has not used the machine in almost 3 years now so we will not reinitiate this.  There were several combinations of events on 216 the patient and the patient presented with his son [name] to the primary care doctor, Dr. Cindee [name], he was not wearing his therapy hearing aids and so with the interview  was mainly conductive stream this time.  Dr. Nyoka [name] noted that he diabetes also not well controlled has been improving, renal function had improved, albumin remain slightly low indicating some malnourishment he is followed by oncology with a history of hepatocellular carcinoma that has led to severe microcytic anemia and he has referred has received infusions with Feraheme and also several blood transfusions.  He had been started on Aricept for mild cognitive impairment and when October 2021 retested his Mini-Mental status exam was 20 out of 30 at the time.  We had discontinued Adderall as he had not filled it in over a year, there was also a question if he actually ever took the Aricept on a regular basis.  It was [name] son who noticed a change in his overall condition in the mid February that his father was in the yard walking in circles and stated there were 2 people in the house and these people could not be seen by his son and were not really there there were episodes of visual hallucinations and he seems to have lost those in daytime as well as at night.  There have also been auditory hallucinations according to his son.  There has been a question for a while if he has been taking regular medication and meals and if he actually has a circadian rhythm.  A home health aide help cook for him as well I do not know how much appetite he had  but he had canceled Meals on Wheels because he did not like the food.  At his social worker visit in December 15 last year and was listed for food resources and supplemental nutrition assistance program case was closed at the time as a Education officer, museum felt there was no need for additional social work.  I looked through the medication list at that time and I am quite concerned that he was still on some kind of narcotics at least prescribed he may not have a have taken it, gavage, Zofran, glipizide, Aricept was 10 mg and supposed to be daily, he also took ?12 ceftriaxone.   Pravachol, Zoloft and Aldactone.   It seems that on the same night following the primary care physician's visit he was admitted through the emergency she was noted to be having significant behavior changes with hallucinations here neither specified as visual or auditory by the admitting physician physician, the confusion and possible delirium seem to have been continued and constant in the context of dementia.  On the 17th he was seen by her psychiatrist Dr. [name] please note that he had still low albumin, elevated AST and ALT, total bilirubin was 1.9 ammonia was not checked, lymphocytes were low 0.4 white blood cells 3.2K low red blood cells even lower is 3.6 carries hemoglobin 10.1 and hematocrit 33.2 also low was a platelet count at only 84,000.  Remarkable is that he must have taken some of his Adderall because he tested positive for amphetamines.  There was no other substances in his bloodstream.  An inpatient geriatric psychiatric treatment was recommended and the team initiated Seroquel 12.5 mg nightly for hallucinations signed by [name] Hall Busing family nurse practitioner psychiatry.  He never was admitted for inpatient psychiatry- he has discharged on Friday after 24 hours from the ED- never got a room (!).  A CT of the head was then obtained again in the emergency department at [facility] he had a fall.  This time he had been released from [facility] so that this was taken place on Monday and the head CT was unremarkable there was no traumatic change noted but definitely cerebral white matter disease which has progressed and cerebral and cerebellar atrophy which have also progressed from previous studies a CT of the cervical spine from 2018 was also compared and he had quite significant disc narrowing severe spinal stenosis and foraminal stenosis disc bulging at C6-C7 and it seems that throughout the cervical spine there were some degenerative changes noted.  Moderate spinal canal stenosis was  noted between C6 and C7 and disc space narrowing severe at C4-C5 and moderate to severe at C6-C7 and C7-T1.  No acute fracture.  Redemonstrating findings that were known before but have progressed.  He has a history of a fusion surgery C5-C6.          Mr. Ballo is a 75 year old male with a history of memory disturbance and narcolepsy.  He returns today for follow-up.  The patient reports that he does not have any family or friends nearby.  He lives at home alone.  Reports that he is able to complete all ADLs independently.  Reports that he operates a motor vehicle.  States that he does some cooking at home.  It is questionable whether getting an accurate history from the patient.  He reports that he is taking Adderall but according to prescription reports he has not had it filled since last year.  Reports that he is taking Aricept.  Returns  today for an evaluation.  HISTORY 06/09/19:  Mr. Vester is a 75 year old male with a history of memory disturbance and narcolepsy.  He returns today for follow-up.  The patient feels that his memory is better.  He lives at home alone.  He is able to complete his ADLs independently.  He operates a Teacher, music.  He prepares his own meals.  He states in regards to his narcolepsy he only takes Adderall on an as-needed basis.  He reports that cost is a factor therefore he does not take it daily.  The patient forgot his hearing aids today.  REVIEW OF SYSTEMS: Out of a complete 14 system review of symptoms, the patient complains only of the following symptoms, and all other reviewed systems are negative.  See HPI  ALLERGIES: Allergies  Allergen Reactions  . Ace Inhibitors Swelling and Other (See Comments)    Angioedema - face.     HOME MEDICATIONS: Outpatient Medications Prior to Visit  Medication Sig Dispense Refill  . alfuzosin (UROXATRAL) 10 MG 24 hr tablet Take 10 mg by mouth at bedtime.    . donepezil (ARICEPT) 10 MG tablet Take 1 tablet  (10 mg total) by mouth at bedtime. 90 tablet 3  . ferrous sulfate 325 (65 FE) MG tablet Take 325 mg by mouth 3 (three) times daily with meals.    [name] Kitchen glipiZIDE (GLUCOTROL) 5 MG tablet Take 1 tablet (5 mg total) by mouth daily before breakfast. 90 tablet 1  . HYDROcodone-acetaminophen (NORCO/VICODIN) 5-325 MG tablet Take 1 tablet by mouth every 6 (six) hours as needed for moderate pain.    . metFORMIN (GLUCOPHAGE) 500 MG tablet Take 1 tablet (500 mg total) by mouth 2 (two) times daily with a meal. 180 tablet 1  . Multiple Vitamin (MULTIVITAMIN WITH MINERALS) TABS tablet Take 1 tablet by mouth daily.    . ondansetron (ZOFRAN-ODT) 4 MG disintegrating tablet Take 4 mg by mouth every 8 (eight) hours as needed for nausea or vomiting.    . pravastatin (PRAVACHOL) 20 MG tablet Take 1 tablet (20 mg total) by mouth at bedtime. 90 tablet 1  . psyllium (KLS NATURAL PSYLLIUM FIBER) 58.6 % powder Take 1 packet by mouth daily.    . sertraline (ZOLOFT) 25 MG tablet Take 25 mg by mouth daily.    [name] Kitchen spironolactone (ALDACTONE) 25 MG tablet Take 1 tablet (25 mg total) by mouth daily. 90 tablet 1   No facility-administered medications prior to visit.    PAST MEDICAL HISTORY: Past Medical History:  Diagnosis Date  . Cancer [facility])    hepatocellular cancer   . Diabetes mellitus without complication ([place])   . Dyspnea   . Hepatitis B   . Hyperlipidemia   . Hypertension   . Irregular heart beat 01/28/2018  . Narcolepsy    per office visit note of 08/2011   . Sleep apnea    cpap    PAST SURGICAL HISTORY: Past Surgical History:  Procedure Laterality Date  . CHOLECYSTECTOMY N/A 01/28/2018   Procedure: LAPAROSCOPIC CHOLECYSTECTOMY;  Surgeon: [name], MD;  Location: [facility];  Service: General;  Laterality: N/A;  . COLONOSCOPY WITH PROPOFOL N/A 08/27/2017   Procedure: COLONOSCOPY WITH PROPOFOL;  Surgeon: Clarene Essex, MD;  Location: [facility];  Service: Endoscopy;  Laterality: N/A;  . ESOPHAGOGASTRODUODENOSCOPY  (EGD) WITH PROPOFOL N/A 02/23/2020   Procedure: ESOPHAGOGASTRODUODENOSCOPY (EGD) WITH PROPOFOL;  Surgeon: Clarene Essex, MD;  Location: [facility];  Service: Endoscopy;  Laterality: N/A;  . HOT HEMOSTASIS N/A 08/27/2017  Procedure: HOT HEMOSTASIS (ARGON PLASMA COAGULATION/BICAP);  Surgeon: Clarene Essex, MD;  Location: [facility];  Service: Endoscopy;  Laterality: N/A;  . LAPAROSCOPIC CHOLECYSTECTOMY  01/28/2018  . LAPAROSCOPY  03/02/2013   Procedure: LAPAROSCOPY DIAGNOSTIC;  Surgeon: [name], MD;  Location: [facility];  Service: General;;  . LIVER ULTRASOUND  03/02/2013   Procedure: LIVER ULTRASOUND;  Surgeon: [name], MD;  Location: [facility];  Service: General;;  . OPEN PARTIAL HEPATECTOMY   03/02/2013   Procedure: OPEN PARTIAL HEPATECTOMY [83];  Surgeon: [name], MD;  Location: [facility];  Service: General;;  DX LAPAROSCOPY, INTRAOPERATIVE LIVER ULTRASOUND, OPEN PARTIAL HEPATECTOMY  . pinched nerve in back      FAMILY HISTORY: Family History  Problem Relation Age of Onset  . Dementia Mother   . Cancer Father   . Diabetes Brother   . Hypertension Brother   . Cancer Brother     SOCIAL HISTORY: Social History   Socioeconomic History  . Marital status: Divorced    Spouse name: Not on file  . Number of children: 2  . Years of education: 15+  . Highest education level: Bachelor's degree (e.g., BA, AB, BS)  Occupational History  . Occupation: Retired  Tobacco Use  . Smoking status: Former Smoker    Years: 5.00    Types: Cigarettes  . Smokeless tobacco: Never Used  Vaping Use  . Vaping Use: Never used  Substance and Sexual Activity  . Alcohol use: No  . Drug use: No  . Sexual activity: Not Currently  Other Topics Concern  . Not on file  Social History Narrative   Patient is single and lives alone- became widower when 1 child was 56 year old, divorced 2nd marriage   Has #2 grown children, not in area or involved in his life   Patient is retired.   Patient has a college  education.   Patient is right-handed.   Patient drinks maybe two sodas daily.   Social Determinants of Health   Financial Resource Strain: Low Risk   . Difficulty of Paying Living Expenses: Not hard at all  Food Insecurity: Food Insecurity Present  . Worried About Charity fundraiser in the Last Year: Sometimes true  . Ran Out of Food in the Last Year: Sometimes true  Transportation Needs: No Transportation Needs  . Lack of Transportation (Medical): No  . Lack of Transportation (Non-Medical): No  Physical Activity: Inactive  . Days of Exercise per Week: 0 days  . Minutes of Exercise per Session: 0 min  Stress: No Stress Concern Present  . Feeling of Stress : Not at all  Social Connections: Moderately Isolated  . Frequency of Communication with Friends and Family: More than three times a week  . Frequency of Social Gatherings with Friends and Family: More than three times a week  . Attends Religious Services: 1 to 4 times per year  . Active Member of Clubs or Organizations: No  . Attends Archivist Meetings: Never  . Marital Status: Divorced  Human resources officer Violence: Not At Risk  . Fear of Current or Ex-Partner: No  . Emotionally Abused: No  . Physically Abused: No  . Sexually Abused: No      PHYSICAL EXAM  Vitals:   10/20/20 1408  BP: 132/70  Pulse: 85  Weight: 180 lb (81.6 kg)  Height: 5\' 9"  (1.753 m)   Body mass index is 26.58 kg/m.   MMSE - Mini Mental State Exam 10/20/2020 06/09/2020 06/09/2019  Not completed: - Unable to complete Unable to complete  Orientation to time 3 4 5   Orientation to Place 2 3 3   Registration 3 3 -  Attention/ Calculation 1 1 -  Recall 1 1 -  Language- name 2 objects 2 2 -  Language- repeat 0 1 -  Language- follow 3 step command 3 3 -  Language- read & follow direction 1 1 -  Write a sentence 1 1 -  Copy design 0 0 -  Total score 17 20 -     Generalized: pale, cleanly dressed, frail,   Neurological examination   Mentation:  desoriented to time, place, and a unable to provide his own history .unable to follow any commands. And seemingly having auditory hallucinations here too, He has to be reminded of medications and meals.  Cranial nerve: no loss of smell and taste-  He has good apetite . Pupils were equal round but sluggishly  reactive to light. Arcus senilis.  Extraocular movements were restricted and visual field impaired on confrontational test. He is serverely deaf-  Facial sensation and strength were normal.   Head turning and shoulder shrug  were normal and symmetric. Motor: The patient hasn't had normal motor tone in years. Sensory: he is unable to cooperate  Coordination: severly impaired, ataxia, dysmetria and mild tremor with action. That is also not new-  Gait and station: impaired gait , cautious gait, positive romberg, retropulsion.  Deep tendon reflexes are symmetric and normal bilaterally.   DIAGNOSTIC DATA (LABS, IMAGING, TESTING) - I reviewed patient records, labs, notes, testing and imaging myself where available.   Lab Results  Component Value Date   WBC 2.9 (L) 10/17/2020   HGB 11.2 (L) 10/17/2020   HCT 34.4 (L) 10/17/2020   MCV 88 10/17/2020   PLT 81 (LL) 10/17/2020      Component Value Date/Time   NA 138 10/17/2020 1241   NA 138 09/03/2016 0748   K 4.8 10/17/2020 1241   K 4.4 09/03/2016 0748   CL 105 10/17/2020 1241   CO2 21 10/17/2020 1241   CO2 19 (L) 09/03/2016 0748   GLUCOSE 273 (H) 10/17/2020 1241   GLUCOSE 224 (H) 10/10/2020 1628   GLUCOSE 174 (H) 09/03/2016 0748   BUN 13 10/17/2020 1241   BUN 13.2 09/03/2016 0748   CREATININE 0.98 10/17/2020 1241   CREATININE 1.35 (H) 02/15/2020 0844   CREATININE 1.1 09/03/2016 0748   CALCIUM 9.7 10/17/2020 1241   CALCIUM 9.4 09/03/2016 0748   PROT 7.4 10/17/2020 1241   PROT 7.8 03/03/2014 0754   ALBUMIN 3.7 10/17/2020 1241   ALBUMIN 3.7 03/03/2014 0754   AST 53 (H) 10/17/2020 1241   AST 27 09/28/2019 0741    AST 36 (H) 03/03/2014 0754   ALT 45 (H) 10/17/2020 1241   ALT 23 09/28/2019 0741   ALT 32 03/03/2014 0754   ALKPHOS 110 10/17/2020 1241   ALKPHOS 77 03/03/2014 0754   BILITOT 1.4 (H) 10/17/2020 1241   BILITOT 1.3 (H) 09/28/2019 0741   BILITOT 0.75 03/03/2014 0754   GFRNONAA >60 10/10/2020 1628   GFRNONAA 51 (L) 02/15/2020 0844   GFRNONAA 82 12/14/2015 1337   GFRAA 75 09/01/2020 1739   GFRAA 59 (L) 02/15/2020 0844   GFRAA >89 12/14/2015 1337   Lab Results  Component Value Date   CHOL 144 04/14/2020   HDL 63 04/14/2020   LDLCALC 66 04/14/2020   TRIG 78 04/14/2020   CHOLHDL 2.3 04/14/2020   Lab Results  Component  Value Date   HGBA1C 7.3 (H) 09/01/2020   Lab Results  Component Value Date   VITAMINB12 171 (L) 08/06/2018   Lab Results  Component Value Date   TSH 1.030 10/17/2020      ASSESSMENT AND PLAN:  Acute decompensation of dementia into delirium, medications were not taken, and meals were forgotten, also associated liver cancer and anemia, cerebellar ataxia ( for many, many years) . His narcolepsy and OSA will not be requiring any treatment.  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  Hepatocelular cancer is taking a lot of his energy, elevated LFT and low RBC, anemia.    1.  Memory disturbance    MMSE was 20/30 last year - MMSE - Mini Mental State Exam 10/20/2020 06/09/2020 06/09/2019 05/05/2018 10/30/2017 04/09/2017  Not completed: - Unable to complete Unable to complete - - -  Orientation to time 3 4 5 3 4 3   Orientation to Place 2 3 3 5 4 5   Registration 3 3 - 3 3 2   Attention/ Calculation 1 1 - 4 3 2   Recall 1 1 - 2 2 1   Language- name 2 objects 2 2 - 2 2 2   Language- repeat 0 1 - 1 1 1   Language- follow 3 step command 3 3 - 3 1 3   Language- read & follow direction 1 1 - 0 1 1  Write a sentence 1 1 - 1 1 1   Copy design 0 0 - 1 1 0  Total score 17 20 - 25 23 21         Continue Aricept 10 a day-  syupposed to have taken it for years. I will add namenda.    The patient lived alone and had no family or friends nearby- I am happy to hear he move in with his son-  Ongoing concerns about his ability to manage his medications, appointments, household chores and driving, so Education officer, museum and home health referral for evaluation for medication management were asked to follow up- the decompensation seems to indicate delirium and dementia is now severe, hearing loss doesn't help it. Namenda starting at 5 mg bid po.   Physical ailments such as his spinal DDD, cervical spine - he is not a surgical candidate- and is at high fall risk.      Narcolepsy/ OSA/ EDS- was followed here since March 16 th 2004.   We will no longer follow for this - de facto he will no longer follow in the sleep clinic but will be seen as a dementia patient .   I spent 45 minutes of face-to-face and non-face-to-face time with patient.  This included previsit chart review, lab review, study review, order entry, electronic health record documentation, patient education.  Rv with MMSE by NP in 6-8 month, increasing namenda.    Larey Seat, MD 10/20/2020, 2:43 PM [facility] [address], [place], [place] [address] [phone]

## 2020-10-21 ENCOUNTER — Telehealth: Payer: Self-pay | Admitting: *Deleted

## 2020-10-21 ENCOUNTER — Ambulatory Visit: Payer: Self-pay | Admitting: *Deleted

## 2020-10-21 NOTE — Patient Outreach (Signed)
[facility]) Care Management  10/21/2020  [name] 20-May-1946 [id number]   CSW spoke with pt's [name], by phone today who confirmed pt's identity.  CSW introduced self, role and reason for call. Pt's son shared with pt that he has had to bring his father in with him a few weeks ago while visiting his dad's home and finding him to be hallucinating.  "he thought people were in his house".  Pt has been staying with son and his wife as well as son's father in law who has dementia. The arrangement is not ideal for long term and thus the son is seeking placement options for his dad.  CSW discussed the process for placement (likely ALF, memory care?) and the financial piece as well as the selection of facility.  Per son, they have looked at 2 ALF's in [place] (about $8,000 monthly) and are concerned about the cost.  CSW has encouraged son to consider consulting with an [place] Attorney to determine options for pt's home and assets.  CSW will assist son with seeking additional ALF options to consider and plan to follow up next week to talk further.   Pt's son feels his father will be open and agreeable to ALF placement- CSW will plan to have conversation with pt as well to further assess and discuss.    [name], MSW, [place] Worker  [place] [phone]

## 2020-10-26 ENCOUNTER — Ambulatory Visit (INDEPENDENT_AMBULATORY_CARE_PROVIDER_SITE_OTHER): Payer: Medicare Other | Admitting: Family Medicine

## 2020-10-26 ENCOUNTER — Other Ambulatory Visit: Payer: Medicare Other | Admitting: *Deleted

## 2020-10-26 ENCOUNTER — Encounter: Payer: Self-pay | Admitting: Family Medicine

## 2020-10-26 ENCOUNTER — Telehealth: Payer: Self-pay | Admitting: *Deleted

## 2020-10-26 ENCOUNTER — Other Ambulatory Visit: Payer: Self-pay

## 2020-10-26 VITALS — BP 125/72 | HR 78 | Temp 97.8°F | Ht 69.0 in | Wt 180.0 lb

## 2020-10-26 DIAGNOSIS — H9190 Unspecified hearing loss, unspecified ear: Secondary | ICD-10-CM

## 2020-10-26 DIAGNOSIS — F0392 Unspecified dementia, unspecified severity, with psychotic disturbance: Secondary | ICD-10-CM

## 2020-10-26 DIAGNOSIS — E1165 Type 2 diabetes mellitus with hyperglycemia: Secondary | ICD-10-CM | POA: Diagnosis not present

## 2020-10-26 DIAGNOSIS — F0391 Unspecified dementia with behavioral disturbance: Secondary | ICD-10-CM

## 2020-10-26 DIAGNOSIS — D649 Anemia, unspecified: Secondary | ICD-10-CM | POA: Diagnosis not present

## 2020-10-26 NOTE — Patient Instructions (Addendum)
Try starting new medicine Namenda to see if that helps with memory.  seroquel at bedtime may also help.  Continue the aricept (donepezil), pravastatin and metformin same doses.  Check blood sugars twice per day and let me know readings in next 1 week. - let us know if you can not find meter or testing strips/lancets.  Discuss iron with hematology next week.  Call audiologist or meet with them to try to get hearing aids working better.  Follow up with me at new office in first part of May, but update on symptoms in next 2 weeks. You can also see Kathrin Ruddy at my new office if I am not available.   Return to the clinic or go to the nearest emergency room if any of your symptoms worsen or new symptoms occur.    If you have lab work done today you will be contacted with your lab results within the next 2 weeks.  If you have not heard from [place] then please contact us. The fastest way to get your results is to register for My Chart.   IF you received an x-ray today, you will receive an invoice from [facility]. Please contact [facility] at [phone] with questions or concerns regarding your invoice.   IF you received labwork today, you will receive an invoice from [employer]. Please contact [facility] at [phone] with questions or concerns regarding your invoice.   Our billing staff will not be able to assist you with questions regarding bills from these companies.  You will be contacted with the lab results as soon as they are available. The fastest way to get your results is to activate your My Chart account. Instructions are located on the last page of this paperwork. If you have not heard from [place] regarding the results in 2 weeks, please contact this office.

## 2020-10-26 NOTE — Progress Notes (Signed)
Subjective:  Patient ID: [name], male    DOB: [birth year]  Age: 75 y.o. MRN: [id number]  CC:  Chief Complaint  Patient presents with  . Follow-up    On Dementia with psychosis and last OV. Pt's family reports that recently the pt has been easily distracted in the middle of tasks. Pt's family reports noticing more confusion recently. Pt is still having the hallucinations, but no new ones. Pt has seen neurology since last OV.      HPI [name] presents for  Dementia with psychosis. Follow-up from February 28 visit.  Previous concern of possible superimposed delirium with dementia versus worsening dementia with psychosis.  Efforts to have been completed to help with placement options, living with son.  Unsure of medications last visit, plan to return today with his home med regimen. With hepatocellular cancer, history of elevated LFTs, checked ammonia which was normal, stable LFTs last visit.  Pancytopenia/anemia also stable, with improved hemoglobin of 11.2 up from 10.5 previously, platelets 81 up from 57.  Social worker note reviewed from March 4th -discussed placement options.  Elder law attorney may also be helpful.  Social worker plan to assist son with seeking additional ALF options and plan on follow-up in 1 week.  Appointment with neurology March 3. Previously had been on Adderall for narcolepsy, but had been discontinued previously, but amphetamine noted on UDS at his ER visit.  Question of Aricept adherence as well.  Inpatient geriatric psychiatry eval had recommended Seroquel 12.5 mg nightly for hallucinations.  Not sure he took the medication. Per notes from neurology, thought to be acute decompensation of dementia into delirium, medications were not taken, some forgetting of meals, also with associated liver cancer, pneumonia, ongoing cerebellar ataxia.  Narcolepsy and OSA not requiring treatment.  Hearing loss likely complicating dementia. Aricept was continued  10 mg a day, added Namenda 5 mg twice daily.  52-monthfollow-up planned with NP.  Today - RMansonfeel about the same. Wearing hearing aid but still some difficulty hearing me.  Here with family.  JJeneen Rinksstates he appears to be off task. Some trouble following requests, getting dressed. Still feels like ongoing issue, but certain times seems to be more confused. Eating meals 3 times per day. Drinking fluids ok.  Still talking about same hallucinations, no changes.  meds reviewed - has not yet started namenda.  Taking seroquel 12.543mqhs took first dose yesterday - no known wakening. metfomin 50017mid pravastain 16m22ms.  Donepezil 10mg73m Other than seroquel - has been taking meds above consistently since living with JamesJeneen Rinksst few weeks.  Will be looking at few facilities for placement today.  No fever, or other illness. No new cough or urinary difficulties.  No home blood sugar readings. Glucose 273 last visit. Not taking glipizide.  Rescheduled appt with hematology 3/15.   Lab Results  Component Value Date   HGBA1C 7.3 (H) 09/01/2020     Results for orders placed or performed in visit on 10/17/20  Ammonia  Result Value Ref Range   Ammonia 83 31 - 169 ug/dL  TSH  Result Value Ref Range   TSH 1.030 0.450 - 4.500 uIU/mL  CBC  Result Value Ref Range   WBC 2.9 (L) 3.4 - 10.8 x10E3/uL   RBC 3.92 (L) 4.14 - 5.80 x10E6/uL   Hemoglobin 11.2 (L) 13.0 - 17.7 g/dL   Hematocrit 34.4 (L) 37.5 - 51.0 %   MCV 88 79 - 97 fL  MCH 28.6 26.6 - 33.0 pg   MCHC 32.6 31.5 - 35.7 g/dL   RDW 19.4 (H) 11.6 - 15.4 %   Platelets 81 (LL) 150 - 450 x10E3/uL   Hematology Comments: Note:   Comprehensive metabolic panel  Result Value Ref Range   Glucose 273 (H) 65 - 99 mg/dL   BUN 13 8 - 27 mg/dL   Creatinine, Ser 0.98 0.76 - 1.27 mg/dL   eGFR 80 >59 mL/min/1.73   BUN/Creatinine Ratio 13 10 - 24   Sodium 138 134 - 144 mmol/L   Potassium 4.8 3.5 - 5.2 mmol/L   Chloride 105 96 - 106 mmol/L    CO2 21 20 - 29 mmol/L   Calcium 9.7 8.6 - 10.2 mg/dL   Total Protein 7.4 6.0 - 8.5 g/dL   Albumin 3.7 3.7 - 4.7 g/dL   Globulin, Total 3.7 1.5 - 4.5 g/dL   Albumin/Globulin Ratio 1.0 (L) 1.2 - 2.2   Bilirubin Total 1.4 (H) 0.0 - 1.2 mg/dL   Alkaline Phosphatase 110 44 - 121 IU/L   AST 53 (H) 0 - 40 IU/L   ALT 45 (H) 0 - 44 IU/L          History Patient Active Problem List   Diagnosis Date Noted  . Psychosis ([place]) 10/06/2020  . Iron deficiency anemia 11/04/2018  . Orthostatic hypotension 08/06/2018  . Hypertension 08/06/2018  . Hyperlipidemia 08/06/2018  . Pancytopenia ([place]) 08/06/2018  . 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 Insufficient treatment with nasal CPAP 05/05/2018  . 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 Irregular heart beat 01/28/2018  . Noncompliance with CPAP treatment 08/14/2017  . Excessive daytime sleepiness 04/09/2017  . Snoring 04/09/2017  . Memory impairment of gradual onset 04/09/2017  . Aortic atherosclerosis ([place]) 11/27/2016  . 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 Altered mental status 03/05/2013  . Respiratory failure, post-operative ([place]) 03/02/2013  . Hepatitis B 02/24/2013  . Cancer, hepatocellular ([place]) 02/10/2013  . Liver tumor-bleeding 01/15/2013  . Epidermal cyst 06/18/2012  . OSA (obstructive sleep apnea) 03/13/2012  . Abnormal leg movement 12/12/2011  . Blood pressure elevated 09/06/2011  . Diabetes mellitus ([place]) 09/06/2011  . 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 BPH (benign prostatic hyperplasia) 09/06/2011  . Diverticula of colon 09/06/2011   Past Medical History:  Diagnosis Date  . Cancer [facility])    hepatocellular cancer   . Diabetes mellitus without complication ([place])   . Dyspnea   . Hepatitis B   . Hyperlipidemia   . Hypertension   . Irregular heart beat 01/28/2018  . Narcolepsy    per office visit note of 08/2011   . Sleep apnea     cpap   Past Surgical History:  Procedure Laterality Date  . CHOLECYSTECTOMY N/A 01/28/2018   Procedure: LAPAROSCOPIC CHOLECYSTECTOMY;  Surgeon: [name], MD;  Location: [place];  Service: General;  Laterality: N/A;  . COLONOSCOPY WITH PROPOFOL N/A 08/27/2017   Procedure: COLONOSCOPY WITH PROPOFOL;  Surgeon: Clarene Essex, MD;  Location: [facility];  Service: Endoscopy;  Laterality: N/A;  . ESOPHAGOGASTRODUODENOSCOPY (EGD) WITH PROPOFOL N/A 02/23/2020   Procedure: ESOPHAGOGASTRODUODENOSCOPY (EGD) WITH PROPOFOL;  Surgeon: Clarene Essex, MD;  Location: [facility];  Service: Endoscopy;  Laterality: N/A;  . HOT HEMOSTASIS N/A 08/27/2017   Procedure: HOT HEMOSTASIS (ARGON PLASMA COAGULATION/BICAP);  Surgeon: Clarene Essex, MD;  Location: [facility];  Service: Endoscopy;  Laterality: N/A;  . LAPAROSCOPIC CHOLECYSTECTOMY  01/28/2018  .  LAPAROSCOPY  03/02/2013   Procedure: LAPAROSCOPY DIAGNOSTIC;  Surgeon: [name], MD;  Location: [facility];  Service: General;;  . LIVER ULTRASOUND  03/02/2013   Procedure: LIVER ULTRASOUND;  Surgeon: [name], MD;  Location: [facility];  Service: General;;  . OPEN PARTIAL HEPATECTOMY   03/02/2013   Procedure: OPEN PARTIAL HEPATECTOMY [83];  Surgeon: [name], MD;  Location: [facility];  Service: General;;  DX LAPAROSCOPY, INTRAOPERATIVE LIVER ULTRASOUND, OPEN PARTIAL HEPATECTOMY  . pinched nerve in back     Allergies  Allergen Reactions  . Ace Inhibitors Swelling and Other (See Comments)    Angioedema - face.    Prior to Admission medications   Medication Sig Start Date End Date Taking? Authorizing Provider  alfuzosin (UROXATRAL) 10 MG 24 hr tablet Take 10 mg by mouth at bedtime.   Yes [provider]  donepezil (ARICEPT) 10 MG tablet Take 1 tablet (10 mg total) by mouth at bedtime. 10/20/20  Yes Dohmeier, Asencion Partridge, MD  ferrous sulfate 325 (65 FE) MG tablet Take 325 mg by mouth 3 (three) times daily with meals.   Yes [provider]  glipiZIDE  (GLUCOTROL) 5 MG tablet Take 1 tablet (5 mg total) by mouth daily before breakfast. 01/08/20  Yes Wendie Agreste, MD  memantine (NAMENDA) 5 MG tablet Take 1 tablet (5 mg total) by mouth 2 (two) times daily. 10/20/20  Yes Dohmeier, Asencion Partridge, MD  metFORMIN (GLUCOPHAGE) 500 MG tablet Take 1 tablet (500 mg total) by mouth 2 (two) times daily with a meal. 01/08/20  Yes Wendie Agreste, MD  Multiple Vitamin (MULTIVITAMIN WITH MINERALS) TABS tablet Take 1 tablet by mouth daily.   Yes [provider]  ondansetron (ZOFRAN-ODT) 4 MG disintegrating tablet Take 4 mg by mouth every 8 (eight) hours as needed for nausea or vomiting.   Yes [provider]  pravastatin (PRAVACHOL) 20 MG tablet Take 1 tablet (20 mg total) by mouth at bedtime. 01/08/20  Yes Wendie Agreste, MD  psyllium (KLS NATURAL PSYLLIUM FIBER) 58.6 % powder Take 1 packet by mouth daily.   Yes [provider]  QUEtiapine (SEROQUEL) 25 MG tablet Take 0.5 tablets (12.5 mg total) by mouth at bedtime. 10/20/20  Yes Dohmeier, Asencion Partridge, MD  sertraline (ZOLOFT) 25 MG tablet Take 25 mg by mouth daily.   Yes [provider]  spironolactone (ALDACTONE) 25 MG tablet Take 1 tablet (25 mg total) by mouth daily. 01/08/20  Yes Wendie Agreste, MD   Social History   Socioeconomic History  . Marital status: Divorced    Spouse name: Not on file  . Number of children: 2  . Years of education: 15+  . Highest education level: Bachelor's degree (e.g., BA, AB, BS)  Occupational History  . Occupation: Retired  Tobacco Use  . Smoking status: Former Smoker    Years: 5.00    Types: Cigarettes  . Smokeless tobacco: Never Used  Vaping Use  . Vaping Use: Never used  Substance and Sexual Activity  . Alcohol use: No  . Drug use: No  . Sexual activity: Not Currently  Other Topics Concern  . Not on file  Social History Narrative   Patient is single and lives alone- became widower when 1 child was 39 year old, divorced 2nd marriage    Has #2 grown children, not in area or involved in his life   Patient is retired.   Patient has a college education.   Patient is right-handed.   Patient drinks  maybe two sodas daily.   Social Determinants of Health   Financial Resource Strain: Low Risk   . Difficulty of Paying Living Expenses: Not hard at all  Food Insecurity: Food Insecurity Present  . Worried About Charity fundraiser in the Last Year: Sometimes true  . Ran Out of Food in the Last Year: Sometimes true  Transportation Needs: No Transportation Needs  . Lack of Transportation (Medical): No  . Lack of Transportation (Non-Medical): No  Physical Activity: Inactive  . Days of Exercise per Week: 0 days  . Minutes of Exercise per Session: 0 min  Stress: No Stress Concern Present  . Feeling of Stress : Not at all  Social Connections: Moderately Isolated  . Frequency of Communication with Friends and Family: More than three times a week  . Frequency of Social Gatherings with Friends and Family: More than three times a week  . Attends Religious Services: 1 to 4 times per year  . Active Member of Clubs or Organizations: No  . Attends Archivist Meetings: Never  . Marital Status: Divorced  Human resources officer Violence: Not At Risk  . Fear of Current or Ex-Partner: No  . Emotionally Abused: No  . Physically Abused: No  . Sexually Abused: No    Review of Systems  Per HPI Objective:   Vitals:   10/26/20 1142  BP: 125/72  Pulse: 78  Temp: 97.8 F (36.6 C)  TempSrc: Temporal  SpO2: 100%  Weight: 180 lb (81.6 kg)  Height: '5\' 9"'  (1.753 m)     Physical Exam Vitals reviewed.  Constitutional:      Appearance: He is well-developed and well-nourished.  HENT:     Head: Normocephalic and atraumatic.  Eyes:     Extraocular Movements: EOM normal.     Pupils: Pupils are equal, round, and reactive to light.  Neck:     Vascular: No carotid bruit or JVD.  Cardiovascular:     Rate and Rhythm: Normal rate and  regular rhythm.     Heart sounds: Normal heart sounds. No murmur heard.   Pulmonary:     Effort: Pulmonary effort is normal.     Breath sounds: Normal breath sounds. No rales.  Abdominal:     General: Abdomen is flat.     Tenderness: There is no abdominal tenderness.  Musculoskeletal:        General: No edema.  Skin:    General: Skin is warm and dry.  Neurological:     General: No focal deficit present.     Mental Status: He is alert and oriented to person, place, and time.  Psychiatric:        Mood and Affect: Mood and affect normal.        Behavior: Behavior normal.     Comments: Pleasant, but difficult with history due to hearing and some confusion. History provided by family, but also written instructions, questions.         Assessment & Plan:  NACHMEN MANSEL is a 75 y.o. male . Dementia with psychosis ([place])  Anemia, unspecified type  Hearing difficulty, unspecified laterality  Family working on placement. Recommended evaluation with audiology regarding his hearing aids has decreased sensory input from hearing may also be contributing to his dementia difficulty. Recommended starting Namenda as prescribed by neuro, and continuing Seroquel as may see some improvement after he has been on that for more than 1 night. Monitor home blood sugar readings twice per day with update next 1  week to decide on medication adjustments Follow-up with hematology next week as planned, can discuss iron supplementation further at that time. Update on symptoms next few weeks, plan for in office eval in 2 months at my new office but can see other provider here or at that office sooner if needed.   No orders of the defined types were placed in this encounter.  Patient Instructions   Try starting new medicine Namenda to see if that helps with memory.  seroquel at bedtime may also help.  Continue the aricept (donepezil), pravastatin and metformin same doses.  Check blood sugars twice  per day and let me know readings in next 1 week. - let us know if you can not find meter or testing strips/lancets.  Discuss iron with hematology next week.  Call audiologist or meet with them to try to get hearing aids working better.  Follow up with me at new office in first part of May, but update on symptoms in next 2 weeks. You can also see Kathrin Ruddy at my new office if I am not available.   Return to the clinic or go to the nearest emergency room if any of your symptoms worsen or new symptoms occur.    If you have lab work done today you will be contacted with your lab results within the next 2 weeks.  If you have not heard from [place] then please contact us. The fastest way to get your results is to register for My Chart.   IF you received an x-ray today, you will receive an invoice from [facility]. Please contact [facility] at [phone] with questions or concerns regarding your invoice.   IF you received labwork today, you will receive an invoice from [facility]. Please contact [facility] at [phone] with questions or concerns regarding your invoice.   Our billing staff will not be able to assist you with questions regarding bills from these companies.  You will be contacted with the lab results as soon as they are available. The fastest way to get your results is to activate your My Chart account. Instructions are located on the last page of this paperwork. If you have not heard from [place] regarding the results in 2 weeks, please contact this office.       Signed, [name], MD [facility]

## 2020-10-26 NOTE — Patient Outreach (Signed)
[name]) Care Management  10/26/2020  [name] 1946/05/10 [id number]   CSW made contact with son today; having not received call/update as planned earlier in week. Per son, they have assessed and decided they will have to sell pt's home to make him eligible for Medicaid. Per son, this will take some time; to get home cleared out, repairs made and sold. He feels that at this time his father is ok to remain in the home with him (son, daughter in law and her father with dementia). Pt's son states he is able to take pt with him to work during the day and at night the pt is resting well and not a concern- no wandering or issues. He wants to take time to get the home sold and then pursue placement.  CSW offered to call son back to check in and son request a follow up call at end of this month.  CSW reminded son to call if questions or needs arise before that time.   [name], MSW, [place] Worker  [place] [phone]

## 2020-10-27 ENCOUNTER — Encounter: Payer: Self-pay | Admitting: Family Medicine

## 2020-11-01 ENCOUNTER — Other Ambulatory Visit: Payer: Self-pay

## 2020-11-01 ENCOUNTER — Inpatient Hospital Stay: Payer: Medicare Other

## 2020-11-01 ENCOUNTER — Telehealth: Payer: Self-pay | Admitting: Nurse Practitioner

## 2020-11-01 ENCOUNTER — Inpatient Hospital Stay: Payer: Medicare Other | Admitting: Nurse Practitioner

## 2020-11-01 ENCOUNTER — Encounter: Payer: Self-pay | Admitting: Nurse Practitioner

## 2020-11-01 VITALS — BP 150/64 | HR 73 | Temp 98.1°F | Resp 20 | Ht 69.0 in | Wt 179.6 lb

## 2020-11-01 DIAGNOSIS — K5730 Diverticulosis of large intestine without perforation or abscess without bleeding: Secondary | ICD-10-CM | POA: Diagnosis not present

## 2020-11-01 DIAGNOSIS — K5520 Angiodysplasia of colon without hemorrhage: Secondary | ICD-10-CM | POA: Diagnosis not present

## 2020-11-01 DIAGNOSIS — Z79899 Other long term (current) drug therapy: Secondary | ICD-10-CM | POA: Diagnosis not present

## 2020-11-01 DIAGNOSIS — C220 Liver cell carcinoma: Secondary | ICD-10-CM

## 2020-11-01 DIAGNOSIS — I8510 Secondary esophageal varices without bleeding: Secondary | ICD-10-CM | POA: Diagnosis not present

## 2020-11-01 DIAGNOSIS — K644 Residual hemorrhoidal skin tags: Secondary | ICD-10-CM | POA: Diagnosis not present

## 2020-11-01 DIAGNOSIS — E119 Type 2 diabetes mellitus without complications: Secondary | ICD-10-CM | POA: Diagnosis not present

## 2020-11-01 DIAGNOSIS — K648 Other hemorrhoids: Secondary | ICD-10-CM | POA: Diagnosis not present

## 2020-11-01 DIAGNOSIS — D61818 Other pancytopenia: Secondary | ICD-10-CM | POA: Diagnosis not present

## 2020-11-01 DIAGNOSIS — D1779 Benign lipomatous neoplasm of other sites: Secondary | ICD-10-CM | POA: Diagnosis not present

## 2020-11-01 DIAGNOSIS — D500 Iron deficiency anemia secondary to blood loss (chronic): Secondary | ICD-10-CM | POA: Diagnosis not present

## 2020-11-01 DIAGNOSIS — K7460 Unspecified cirrhosis of liver: Secondary | ICD-10-CM | POA: Diagnosis not present

## 2020-11-01 DIAGNOSIS — D649 Anemia, unspecified: Secondary | ICD-10-CM | POA: Diagnosis not present

## 2020-11-01 DIAGNOSIS — K621 Rectal polyp: Secondary | ICD-10-CM | POA: Diagnosis not present

## 2020-11-01 LAB — CBC WITH DIFFERENTIAL (CANCER CENTER ONLY)
Abs Immature Granulocytes: 0 10*3/uL (ref 0.00–0.07)
Basophils Absolute: 0 10*3/uL (ref 0.0–0.1)
Basophils Relative: 0 %
Eosinophils Absolute: 0 10*3/uL (ref 0.0–0.5)
Eosinophils Relative: 1 %
HCT: 30.8 % — ABNORMAL LOW (ref 39.0–52.0)
Hemoglobin: 9.6 g/dL — ABNORMAL LOW (ref 13.0–17.0)
Immature Granulocytes: 0 %
Lymphocytes Relative: 21 %
Lymphs Abs: 0.7 10*3/uL (ref 0.7–4.0)
MCH: 29.1 pg (ref 26.0–34.0)
MCHC: 31.2 g/dL (ref 30.0–36.0)
MCV: 93.3 fL (ref 80.0–100.0)
Monocytes Absolute: 0.4 10*3/uL (ref 0.1–1.0)
Monocytes Relative: 10 %
Neutro Abs: 2.3 10*3/uL (ref 1.7–7.7)
Neutrophils Relative %: 68 %
Platelet Count: 65 10*3/uL — ABNORMAL LOW (ref 150–400)
RBC: 3.3 MIL/uL — ABNORMAL LOW (ref 4.22–5.81)
RDW: 19.7 % — ABNORMAL HIGH (ref 11.5–15.5)
WBC Count: 3.5 10*3/uL — ABNORMAL LOW (ref 4.0–10.5)
nRBC: 0 % (ref 0.0–0.2)

## 2020-11-01 NOTE — Progress Notes (Signed)
[place] OFFICE PROGRESS NOTE   Diagnosis: Hepatocellular carcinoma, anemia  INTERVAL HISTORY:   [name] returns for follow-up.  He is accompanied by his son to today's visit.  He is currently living with his son/family.  He has not been taking oral iron.  His son plans to pick some up at the pharmacy today.  [name] denies bleeding.  No shortness of breath.  He was recently started on Namenda.  His son has noted no improvement.  Objective:  Vital signs in last 24 hours:  Blood pressure (!) 150/64, pulse 73, temperature 98.1 F (36.7 C), temperature source Tympanic, resp. rate 20, height 5\' 9"  (1.753 m), weight 179 lb 9.6 oz (81.5 kg), SpO2 100 %.   Resp: Lungs clear bilaterally. Cardio: Regular rate and rhythm. GI: No hepatosplenomegaly. Vascular: No leg edema.    Lab Results:  Lab Results  Component Value Date   WBC 3.5 (L) 11/01/2020   HGB 9.6 (L) 11/01/2020   HCT 30.8 (L) 11/01/2020   MCV 93.3 11/01/2020   PLT 65 (L) 11/01/2020   NEUTROABS 2.3 11/01/2020    Imaging:  No results found.  Medications: I have reviewed the patient's current medications.  Assessment/Plan: 1. Hepatocellular carcinoma, stage I (T1 NX) status post partial left hepatectomy 03/02/2013.  08/31/2013 AFP 2.4.   CT abdomen 08/31/2013 with postoperative changes of partial hepatectomy with small postoperative fluid collection along the resection margin. No definite signs to suggest residual or locally recurrent disease. Several borderline enlarged and minimally enlarged lymph nodes superior to the liver in the juxtapericardiac fat of the lower anterior mediastinum with the largest measuring 11 mm slightly increased compared to the prior study 01/15/2013. Several prominent non-pathologically enlarged upper abdominal ligament lymph nodes similar to the prior study. Small esophageal varices.   CT abdomen 03/03/2012 without evidence of recurrent hepatocellular  carcinoma, stable right lung base nodule  CT the abdomen and pelvis 09/06/2014 without evidence of recurrent hepatocellular carcinoma, slight enlargement of pre-cardiac lymph nodes  CT abdomen/pelvis 09/07/2015 with no findings for recurrent tumor or metastatic disease. Stable small 3 mm right middle lobe pulmonary nodule since 2014. Epicardial lymph node measured 11 mm, previously 13 mm. Stable cirrhotic changes. Fairly significant esophageal varices.  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 Cirrhosis. 3. Hepatitis B core antibody positive. 4. Diabetes. 5. Severe microcytic anemia-likely iron deficiency anemia-progressive 06/03/2017. Stool Hemoccult positive 3 06/05/2017.  6. Colonoscopy 12/27/2016-external and internal hemorrhoids. Diverticulosis in the sigmoid, descending and transverse colon. One medium polyp in the distal descending colon. 3 small polyps in the transverse colon. 2 diminutive polyps in the rectum and the proximal ascending colon. Medium-sized lipoma transverse colon. Small lipoma mid ascending colon. Multiplenonbleeding colonic angiodysplastic lesions. Pathology-tubular adenomas, hyperplastic polyps. 7. Pancytopenia secondary to cirrhosis and GI bleeding 8. Laparoscopic cholecystectomy 01/28/2018 9. Decreased iron stores 10/15/2018; transfused 2 units of blood 2/29/2020and 06/01/2020; Feraheme 11/04/2018 and 11/11/2018;Feraheme 02/18/2019 and 02/25/2019,  02/15/2020,02/29/2020, 06/10/2020, 06/17/2020, 09/21/2020, 09/28/2020    Disposition: [name] appears unchanged.  He remains in clinical remission from hepatocellular carcinoma.  AFP tumor marker from 09/20/2020 was in normal range.  We reviewed the  CBC from today.  Hemoglobin is lower.  I reviewed with his son that the anemia is likely due to chronic GI blood loss in the setting of advanced cirrhosis with esophageal varices.  He received IV iron in early February.  He will resume oral iron, ferrous sulfate 3 times a day.  He is not symptomatic from the anemia at present.  He will return for a CBC and iron studies in 3 weeks.  Follow-up in 3 weeks.  Plan reviewed with Dr. Benay Spice.    Ned Card ANP/GNP-BC   11/01/2020  12:31 PM

## 2020-11-01 NOTE — Telephone Encounter (Signed)
Scheduled follow-up appointment per 3/15 los. Patient is aware. 

## 2020-11-14 ENCOUNTER — Emergency Department (HOSPITAL_BASED_OUTPATIENT_CLINIC_OR_DEPARTMENT_OTHER)
Admission: EM | Admit: 2020-11-14 | Discharge: 2020-11-14 | Disposition: A | Discharge status: Home / Self Care | Payer: Medicare Other | Attending: Emergency Medicine | Admitting: Emergency Medicine

## 2020-11-14 ENCOUNTER — Emergency Department (HOSPITAL_BASED_OUTPATIENT_CLINIC_OR_DEPARTMENT_OTHER): Payer: Medicare Other

## 2020-11-14 ENCOUNTER — Encounter: Payer: Self-pay | Admitting: Oncology

## 2020-11-14 ENCOUNTER — Encounter (HOSPITAL_BASED_OUTPATIENT_CLINIC_OR_DEPARTMENT_OTHER): Payer: Self-pay

## 2020-11-14 ENCOUNTER — Other Ambulatory Visit: Payer: Self-pay

## 2020-11-14 DIAGNOSIS — D649 Anemia, unspecified: Secondary | ICD-10-CM

## 2020-11-14 DIAGNOSIS — R531 Weakness: Secondary | ICD-10-CM

## 2020-11-14 DIAGNOSIS — F0390 Unspecified dementia without behavioral disturbance: Secondary | ICD-10-CM | POA: Diagnosis not present

## 2020-11-14 DIAGNOSIS — E860 Dehydration: Secondary | ICD-10-CM

## 2020-11-14 DIAGNOSIS — Z8505 Personal history of malignant neoplasm of liver: Secondary | ICD-10-CM | POA: Diagnosis not present

## 2020-11-14 DIAGNOSIS — I10 Essential (primary) hypertension: Secondary | ICD-10-CM | POA: Insufficient documentation

## 2020-11-14 DIAGNOSIS — I517 Cardiomegaly: Secondary | ICD-10-CM | POA: Diagnosis not present

## 2020-11-14 DIAGNOSIS — Z87891 Personal history of nicotine dependence: Secondary | ICD-10-CM | POA: Diagnosis not present

## 2020-11-14 DIAGNOSIS — E119 Type 2 diabetes mellitus without complications: Secondary | ICD-10-CM | POA: Diagnosis not present

## 2020-11-14 DIAGNOSIS — Z7984 Long term (current) use of oral hypoglycemic drugs: Secondary | ICD-10-CM | POA: Insufficient documentation

## 2020-11-14 DIAGNOSIS — Z79899 Other long term (current) drug therapy: Secondary | ICD-10-CM | POA: Diagnosis not present

## 2020-11-14 LAB — URINALYSIS, MICROSCOPIC (REFLEX): WBC, UA: NONE SEEN WBC/hpf (ref 0–5)

## 2020-11-14 LAB — URINALYSIS, ROUTINE W REFLEX MICROSCOPIC
Bilirubin Urine: NEGATIVE
Glucose, UA: >=500 mg/dL — AB
Hgb urine dipstick: NEGATIVE
Ketones, ur: NEGATIVE mg/dL
Leukocytes,Ua: NEGATIVE
Nitrite: NEGATIVE
Protein, ur: NEGATIVE mg/dL
Specific Gravity, Urine: >1.03 — ABNORMAL HIGH (ref 1.005–1.030)
pH: 5.5 (ref 5.0–8.0)

## 2020-11-14 LAB — COMPREHENSIVE METABOLIC PANEL
ALT: 45 U/L — ABNORMAL HIGH (ref 0–44)
AST: 60 U/L — ABNORMAL HIGH (ref 15–41)
Albumin: 3 g/dL — ABNORMAL LOW (ref 3.5–5.0)
Alkaline Phosphatase: 108 U/L (ref 38–126)
Anion gap: 8 (ref 5–15)
BUN: 16 mg/dL (ref 8–23)
CO2: 22 mmol/L (ref 22–32)
Calcium: 8.5 mg/dL — ABNORMAL LOW (ref 8.9–10.3)
Chloride: 106 mmol/L (ref 98–111)
Creatinine, Ser: 1.03 mg/dL (ref 0.61–1.24)
GFR, Estimated: >60 mL/min (ref >60)
Glucose, Bld: 280 mg/dL — ABNORMAL HIGH (ref 70–99)
Potassium: 3.8 mmol/L (ref 3.5–5.1)
Sodium: 136 mmol/L (ref 135–145)
Total Bilirubin: 1.5 mg/dL — ABNORMAL HIGH (ref 0.3–1.2)
Total Protein: 6.8 g/dL (ref 6.5–8.1)

## 2020-11-14 LAB — CBC WITH DIFFERENTIAL/PLATELET
Abs Immature Granulocytes: 0.01 10*3/uL (ref 0.00–0.07)
Basophils Absolute: 0 10*3/uL (ref 0.0–0.1)
Basophils Relative: 1 %
Eosinophils Absolute: 0 10*3/uL (ref 0.0–0.5)
Eosinophils Relative: 1 %
HCT: 26.6 % — ABNORMAL LOW (ref 39.0–52.0)
Hemoglobin: 8.6 g/dL — ABNORMAL LOW (ref 13.0–17.0)
Immature Granulocytes: 0 %
Lymphocytes Relative: 18 %
Lymphs Abs: 0.5 10*3/uL — ABNORMAL LOW (ref 0.7–4.0)
MCH: 30.4 pg (ref 26.0–34.0)
MCHC: 32.3 g/dL (ref 30.0–36.0)
MCV: 94 fL (ref 80.0–100.0)
Monocytes Absolute: 0.3 10*3/uL (ref 0.1–1.0)
Monocytes Relative: 10 %
Neutro Abs: 1.8 10*3/uL (ref 1.7–7.7)
Neutrophils Relative %: 70 %
Platelets: 62 10*3/uL — ABNORMAL LOW (ref 150–400)
RBC: 2.83 MIL/uL — ABNORMAL LOW (ref 4.22–5.81)
RDW: 17.5 % — ABNORMAL HIGH (ref 11.5–15.5)
WBC: 2.6 10*3/uL — ABNORMAL LOW (ref 4.0–10.5)
nRBC: 0 % (ref 0.0–0.2)

## 2020-11-14 LAB — TROPONIN I (HIGH SENSITIVITY): Troponin I (High Sensitivity): 4 ng/L (ref <18)

## 2020-11-14 MED ORDER — SODIUM CHLORIDE 0.9 % IV BOLUS
500.0000 mL | Freq: Once | INTRAVENOUS | Status: AC
  Administered 2020-11-14: 500 mL via INTRAVENOUS

## 2020-11-14 NOTE — ED Notes (Signed)
ED Provider at bedside. 

## 2020-11-14 NOTE — ED Triage Notes (Signed)
Weakness/fatigue and chills today per son (pt is AMS).  Per son, pt receives blood transfusions regularly, last ones being 06/2020 & 09/2020 and feels as if he is due for one.  No fevers, but patient does c/o being cold frequently.  Normotensive in triage, HR 67

## 2020-11-14 NOTE — Discharge Instructions (Signed)
You were seen in the emergency department for feeling weak and chilled.  Your blood counts show your hemoglobin 12 dropped but you do not need a transfusion yet.  You will likely need 1 soon.  You also look slightly dehydrated.  Please call Dr. Benay Spice for followup.

## 2020-11-14 NOTE — ED Provider Notes (Signed)
[facility] EMERGENCY DEPARTMENT Provider Note   CSN: [id number] Arrival date & time: 11/14/20  1608     History Chief Complaint  Patient presents with  . Weakness    [name] is a 75 y.o. male.  Level 5 caveat secondary to dementia.  He is brought in by his son for evaluation of feeling cold and weak since last evening.  No specific complaints.  Son states that when he felt like this in the past he is required a blood transfusion.  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  No reported bleeding noted by patient or family.  The history is provided by the patient.  Weakness Severity:  Moderate Onset quality:  Gradual Duration:  2 days Timing:  Constant Progression:  Unchanged Chronicity:  Recurrent Relieved by:  None tried Worsened by:  Activity Ineffective treatments:  None tried Associated symptoms: no abdominal pain, no chest pain, no cough, no dysuria, no falls, no fever, no headaches, no hematochezia, no melena, no sensory-motor deficit, no shortness of breath and no vomiting        Past Medical History:  Diagnosis Date  . Cancer [facility])    hepatocellular cancer   . Diabetes mellitus without complication ([place])   . Dyspnea   . Hepatitis B   . Hyperlipidemia   . Hypertension   . Irregular heart beat 01/28/2018  . Narcolepsy    per office visit note of 08/2011   . Sleep apnea    cpap    Patient Active Problem List   Diagnosis Date Noted  . Psychosis ([place]) 10/06/2020  . Iron deficiency anemia 11/04/2018  . Orthostatic hypotension 08/06/2018  . Hypertension 08/06/2018  . Hyperlipidemia 08/06/2018  . Pancytopenia ([place]) 08/06/2018  . 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 Insufficient treatment with nasal CPAP 05/05/2018  . 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 Irregular heart beat 01/28/2018  . Noncompliance with  CPAP treatment 08/14/2017  . Excessive daytime sleepiness 04/09/2017  . Snoring 04/09/2017  . Memory impairment of gradual onset 04/09/2017  . Aortic atherosclerosis ([place]) 11/27/2016  . 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 Altered mental status 03/05/2013  . Respiratory failure, post-operative ([place]) 03/02/2013  . Hepatitis B 02/24/2013  . Cancer, hepatocellular ([place]) 02/10/2013  . Liver tumor-bleeding 01/15/2013  . Epidermal cyst 06/18/2012  . OSA (obstructive sleep apnea) 03/13/2012  . Abnormal leg movement 12/12/2011  . Blood pressure elevated 09/06/2011  . Diabetes mellitus ([place]) 09/06/2011  . 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 BPH (benign prostatic hyperplasia) 09/06/2011  . Diverticula of colon 09/06/2011    Past Surgical History:  Procedure Laterality Date  . CHOLECYSTECTOMY N/A 01/28/2018   Procedure: LAPAROSCOPIC CHOLECYSTECTOMY;  Surgeon: [name], MD;  Location: [facility];  Service: General;  Laterality: N/A;  . COLONOSCOPY WITH PROPOFOL N/A 08/27/2017   Procedure: COLONOSCOPY WITH PROPOFOL;  Surgeon: Clarene Essex, MD;  Location: [facility];  Service: Endoscopy;  Laterality: N/A;  . ESOPHAGOGASTRODUODENOSCOPY (EGD) WITH PROPOFOL N/A 02/23/2020   Procedure: ESOPHAGOGASTRODUODENOSCOPY (EGD) WITH PROPOFOL;  Surgeon: Clarene Essex, MD;  Location: [facility];  Service: Endoscopy;  Laterality: N/A;  . HOT HEMOSTASIS N/A 08/27/2017   Procedure: HOT HEMOSTASIS (ARGON PLASMA COAGULATION/BICAP);  Surgeon: Clarene Essex, MD;  Location: [facility];  Service: Endoscopy;  Laterality: N/A;  . LAPAROSCOPIC CHOLECYSTECTOMY  01/28/2018  . LAPAROSCOPY  03/02/2013   Procedure:  LAPAROSCOPY DIAGNOSTIC;  Surgeon: [name], MD;  Location: [facility];  Service: General;;  . LIVER ULTRASOUND  03/02/2013   Procedure: LIVER ULTRASOUND;  Surgeon: [name], MD;  Location: [facility];  Service: General;;  . OPEN PARTIAL HEPATECTOMY   03/02/2013   Procedure: OPEN PARTIAL HEPATECTOMY [83];   Surgeon: [name], MD;  Location: [facility];  Service: General;;  DX LAPAROSCOPY, INTRAOPERATIVE LIVER ULTRASOUND, OPEN PARTIAL HEPATECTOMY  . pinched nerve in back         Family History  Problem Relation Age of Onset  . Dementia Mother   . Cancer Father   . Diabetes Brother   . Hypertension Brother   . Cancer Brother     Social History   Tobacco Use  . Smoking status: Former Smoker    Years: 5.00    Types: Cigarettes  . Smokeless tobacco: Never Used  Vaping Use  . Vaping Use: Never used  Substance Use Topics  . Alcohol use: No  . Drug use: No    Home Medications Prior to Admission medications   Medication Sig Start Date End Date Taking? Authorizing Provider  donepezil (ARICEPT) 10 MG tablet Take 1 tablet (10 mg total) by mouth at bedtime. 10/20/20  Yes Dohmeier, Asencion Partridge, MD  ferrous sulfate 325 (65 FE) MG tablet Take 325 mg by mouth 3 (three) times daily with meals.   Yes [provider]  memantine (NAMENDA) 5 MG tablet Take 1 tablet (5 mg total) by mouth 2 (two) times daily. 10/20/20  Yes Dohmeier, Asencion Partridge, MD  metFORMIN (GLUCOPHAGE) 500 MG tablet Take 1 tablet (500 mg total) by mouth 2 (two) times daily with a meal. 01/08/20  Yes Wendie Agreste, MD  Multiple Vitamin (MULTIVITAMIN WITH MINERALS) TABS tablet Take 1 tablet by mouth daily.   Yes [provider]  pravastatin (PRAVACHOL) 20 MG tablet Take 1 tablet (20 mg total) by mouth at bedtime. 01/08/20  Yes Wendie Agreste, MD  psyllium (KLS NATURAL PSYLLIUM FIBER) 58.6 % powder Take 1 packet by mouth daily.   Yes [provider]  QUEtiapine (SEROQUEL) 25 MG tablet Take 0.5 tablets (12.5 mg total) by mouth at bedtime. 10/20/20  Yes Dohmeier, Asencion Partridge, MD    Allergies    Ace inhibitors  Review of Systems   Review of Systems  Constitutional: Positive for fatigue. Negative for fever.  HENT: Negative for sore throat.   Eyes: Negative for visual disturbance.  Respiratory: Negative for cough and  shortness of breath.   Cardiovascular: Negative for chest pain.  Gastrointestinal: Negative for abdominal pain, hematochezia, melena and vomiting.  Genitourinary: Negative for dysuria.  Musculoskeletal: Negative for back pain and falls.  Skin: Negative for rash.  Neurological: Positive for weakness. Negative for headaches.    Physical Exam Updated Vital Signs BP (!) 133/58 (BP Location: Right Arm)   Pulse 67   Temp 98.3 F (36.8 C) (Oral)   Resp 16   Ht 5\' 9"  (1.753 m)   Wt 84.8 kg   SpO2 100%   BMI 27.62 kg/m   Physical Exam Vitals and nursing note reviewed.  Constitutional:      Appearance: Normal appearance. He is well-developed.  HENT:     Head: Normocephalic and atraumatic.  Eyes:     Conjunctiva/sclera: Conjunctivae normal.  Cardiovascular:     Rate and Rhythm: Normal rate and regular rhythm.     Heart sounds: No murmur heard.   Pulmonary:     Effort: Pulmonary effort is normal.  No respiratory distress.     Breath sounds: Normal breath sounds.  Abdominal:     Palpations: Abdomen is soft.     Tenderness: There is no abdominal tenderness.  Musculoskeletal:        General: No deformity or signs of injury. Normal range of motion.     Cervical back: Neck supple.  Skin:    General: Skin is warm and dry.  Neurological:     General: No focal deficit present.     Mental Status: He is alert.     ED Results / Procedures / Treatments   Labs (all labs ordered are listed, but only abnormal results are displayed) Labs Reviewed  COMPREHENSIVE METABOLIC PANEL - Abnormal; Notable for the following components:      Result Value   Glucose, Bld 280 (*)    Calcium 8.5 (*)    Albumin 3.0 (*)    AST 60 (*)    ALT 45 (*)    Total Bilirubin 1.5 (*)    All other components within normal limits  CBC WITH DIFFERENTIAL/PLATELET - Abnormal; Notable for the following components:   WBC 2.6 (*)    RBC 2.83 (*)    Hemoglobin 8.6 (*)    HCT 26.6 (*)    RDW 17.5 (*)     Platelets 62 (*)    Lymphs Abs 0.5 (*)    All other components within normal limits  URINALYSIS, ROUTINE W REFLEX MICROSCOPIC - Abnormal; Notable for the following components:   Specific Gravity, Urine >1.030 (*)    Glucose, UA >=500 (*)    All other components within normal limits  URINALYSIS, MICROSCOPIC (REFLEX) - Abnormal; Notable for the following components:   Bacteria, UA RARE (*)    All other components within normal limits  TROPONIN I (HIGH SENSITIVITY)    EKG EKG Interpretation  Date/Time:  Monday November 14 2020 16:31:05 EDT Ventricular Rate:  71 PR Interval:    QRS Duration: 90 QT Interval:  451 QTC Calculation: 494 R Axis:   62 Text Interpretation: Sinus or ectopic atrial rhythm Borderline prolonged QT interval Baseline wander in lead(s) V6 No significant change since prior 2/22 Confirmed by Aletta Edouard [phone]) on 11/14/2020 4:42:25 PM   Radiology DG Chest Port 1 View  Result Date: 11/14/2020 CLINICAL DATA:  Weakness EXAM: PORTABLE CHEST 1 VIEW COMPARISON:  10/10/2020 FINDINGS: Low lung volumes. Mild cardiomegaly with slight central congestion. Patchy atelectasis or minimal infiltrate at the bases. Aortic atherosclerosis. No pneumothorax. IMPRESSION: Low lung volumes with patchy atelectasis or minimal infiltrate at the bases. Mild cardiomegaly with slight central congestion. Electronically Signed   By: Donavan Foil M.D.   On: 11/14/2020 17:47    Procedures Procedures   Medications Ordered in ED Medications  sodium chloride 0.9 % bolus 500 mL (0 mLs Intravenous Stopped 11/14/20 2022)    ED Course  I have reviewed the triage vital signs and the nursing notes.  Pertinent labs & imaging results that were available during my care of the patient were reviewed by me and considered in my medical decision making (see chart for details).  Clinical Course as of 11/15/20 1045  Mon Nov 14, 2020  1750 CMP with elevated glucose elevated LFTs consistent with his priors  [MB]  1939 Chest x-ray read as bibasilar atelectasis versus early infiltrates.  Patient has no pulmonary symptoms sats 100% on room air.  Likely just due to poor inspiration. [MB]    Clinical Course User Index [MB] Hayden Rasmussen,  MD   MDM Rules/Calculators/A&P                         This patient complains of weakness and feeling cold this involves an extensive number of treatment Options and is a complaint that carries with it a high risk of complications and Morbidity. The differential includes sepsis, UTI, pneumonia, anemia, metabolic derangement  I ordered, reviewed and interpreted labs, which included CBC with low white count low platelets consistent with priors, hemoglobin trending down, chemistries with elevated glucose otherwise unremarkable, urinalysis without signs of action, singlel troponin unremarkable I ordered medication IV fluids I ordered imaging studies which included chest x-ray and I independently    visualized and interpreted imaging which showed poor inspiration, atelectasis versus infiltrate Additional history obtained from patient son Previous records obtained and reviewed in epic, has hepatocellular carcinoma and has had transfusion in the past for anemia  After the interventions stated above, I reevaluated the patient and found patient to be hemodynamically stable.  Reviewed results with patient and his son.  Recommended close oncology follow-up as likely he is going to need transfusion soon.  Return instructions discussed.  I am unable to transfuse patient at this clinic and site.  Do not feel he is so symptomatic that he needs admission currently.   Final Clinical Impression(s) / ED Diagnoses Final diagnoses:  Generalized weakness  Anemia, unspecified type  Dehydration    Rx / DC Orders ED Discharge Orders    None       Hayden Rasmussen, MD 11/15/20 1048

## 2020-11-15 ENCOUNTER — Other Ambulatory Visit: Payer: Self-pay | Admitting: *Deleted

## 2020-11-15 ENCOUNTER — Telehealth: Payer: Self-pay | Admitting: Oncology

## 2020-11-15 DIAGNOSIS — D500 Iron deficiency anemia secondary to blood loss (chronic): Secondary | ICD-10-CM

## 2020-11-15 NOTE — Progress Notes (Signed)
Ordered lab for 11/17/20 to assess for need for transfusion.

## 2020-11-15 NOTE — Telephone Encounter (Signed)
Called pt multiple times, no answer. No vm set up. Will try to call pt again tomorrow about appt.

## 2020-11-16 ENCOUNTER — Telehealth: Payer: Self-pay | Admitting: Oncology

## 2020-11-16 NOTE — Telephone Encounter (Signed)
Appt scheduled per 3/29 sch msg. Pt's son is aware.

## 2020-11-17 ENCOUNTER — Inpatient Hospital Stay: Payer: Medicare Other

## 2020-11-17 ENCOUNTER — Telehealth: Payer: Self-pay

## 2020-11-17 ENCOUNTER — Other Ambulatory Visit: Payer: Self-pay | Admitting: Nurse Practitioner

## 2020-11-17 ENCOUNTER — Other Ambulatory Visit: Payer: Self-pay

## 2020-11-17 ENCOUNTER — Ambulatory Visit: Payer: Self-pay | Admitting: *Deleted

## 2020-11-17 ENCOUNTER — Telehealth: Payer: Self-pay | Admitting: *Deleted

## 2020-11-17 DIAGNOSIS — Z79899 Other long term (current) drug therapy: Secondary | ICD-10-CM | POA: Diagnosis not present

## 2020-11-17 DIAGNOSIS — E119 Type 2 diabetes mellitus without complications: Secondary | ICD-10-CM | POA: Diagnosis not present

## 2020-11-17 DIAGNOSIS — K5730 Diverticulosis of large intestine without perforation or abscess without bleeding: Secondary | ICD-10-CM | POA: Diagnosis not present

## 2020-11-17 DIAGNOSIS — K648 Other hemorrhoids: Secondary | ICD-10-CM | POA: Diagnosis not present

## 2020-11-17 DIAGNOSIS — I8510 Secondary esophageal varices without bleeding: Secondary | ICD-10-CM | POA: Diagnosis not present

## 2020-11-17 DIAGNOSIS — C220 Liver cell carcinoma: Secondary | ICD-10-CM | POA: Diagnosis not present

## 2020-11-17 DIAGNOSIS — K7460 Unspecified cirrhosis of liver: Secondary | ICD-10-CM | POA: Diagnosis not present

## 2020-11-17 DIAGNOSIS — D61818 Other pancytopenia: Secondary | ICD-10-CM | POA: Diagnosis not present

## 2020-11-17 DIAGNOSIS — D500 Iron deficiency anemia secondary to blood loss (chronic): Secondary | ICD-10-CM

## 2020-11-17 DIAGNOSIS — K621 Rectal polyp: Secondary | ICD-10-CM | POA: Diagnosis not present

## 2020-11-17 DIAGNOSIS — D649 Anemia, unspecified: Secondary | ICD-10-CM | POA: Diagnosis not present

## 2020-11-17 DIAGNOSIS — K644 Residual hemorrhoidal skin tags: Secondary | ICD-10-CM | POA: Diagnosis not present

## 2020-11-17 DIAGNOSIS — D1779 Benign lipomatous neoplasm of other sites: Secondary | ICD-10-CM | POA: Diagnosis not present

## 2020-11-17 DIAGNOSIS — K5520 Angiodysplasia of colon without hemorrhage: Secondary | ICD-10-CM | POA: Diagnosis not present

## 2020-11-17 LAB — CBC WITH DIFFERENTIAL (CANCER CENTER ONLY)
Abs Immature Granulocytes: 0 10*3/uL (ref 0.00–0.07)
Basophils Absolute: 0 10*3/uL (ref 0.0–0.1)
Basophils Relative: 1 %
Eosinophils Absolute: 0.1 10*3/uL (ref 0.0–0.5)
Eosinophils Relative: 2 %
HCT: 25.5 % — ABNORMAL LOW (ref 39.0–52.0)
Hemoglobin: 8.1 g/dL — ABNORMAL LOW (ref 13.0–17.0)
Immature Granulocytes: 0 %
Lymphocytes Relative: 22 %
Lymphs Abs: 0.7 10*3/uL (ref 0.7–4.0)
MCH: 30.1 pg (ref 26.0–34.0)
MCHC: 31.8 g/dL (ref 30.0–36.0)
MCV: 94.8 fL (ref 80.0–100.0)
Monocytes Absolute: 0.3 10*3/uL (ref 0.1–1.0)
Monocytes Relative: 9 %
Neutro Abs: 2 10*3/uL (ref 1.7–7.7)
Neutrophils Relative %: 66 %
Platelet Count: 61 10*3/uL — ABNORMAL LOW (ref 150–400)
RBC: 2.69 MIL/uL — ABNORMAL LOW (ref 4.22–5.81)
RDW: 17.1 % — ABNORMAL HIGH (ref 11.5–15.5)
WBC Count: 3 10*3/uL — ABNORMAL LOW (ref 4.0–10.5)
nRBC: 0 % (ref 0.0–0.2)

## 2020-11-17 LAB — SAMPLE TO BLOOD BANK

## 2020-11-17 NOTE — Patient Outreach (Signed)
[facility]) Care Management  11/17/2020  [name] 1946/01/30 [id number]  CSW attempted to reach pt's family by phone for updates on plans/progress but no answer. CSW left a HIPPA compliant voice message and will await callback or try again in 3-4 business days.   [name], MSW, [place] Worker  [place] [phone]

## 2020-11-17 NOTE — Telephone Encounter (Signed)
TC from [place] in the lab with lab results -HGB 8.1, Platelets-61 Per Ned Card NP Pt will require 2 units of RBC on Saturday 11/19/20. Appointment confirmed with Pt's son.

## 2020-11-18 ENCOUNTER — Telehealth: Payer: Self-pay | Admitting: Nurse Practitioner

## 2020-11-18 LAB — PREPARE RBC (CROSSMATCH)

## 2020-11-18 NOTE — Telephone Encounter (Signed)
Reminder call made for 4/4 appt . Left message for patient with appt time and address

## 2020-11-19 ENCOUNTER — Other Ambulatory Visit: Payer: Self-pay

## 2020-11-19 ENCOUNTER — Inpatient Hospital Stay: Payer: Medicare Other | Attending: Oncology

## 2020-11-19 DIAGNOSIS — D500 Iron deficiency anemia secondary to blood loss (chronic): Secondary | ICD-10-CM

## 2020-11-19 DIAGNOSIS — R161 Splenomegaly, not elsewhere classified: Secondary | ICD-10-CM | POA: Diagnosis not present

## 2020-11-19 DIAGNOSIS — E119 Type 2 diabetes mellitus without complications: Secondary | ICD-10-CM | POA: Insufficient documentation

## 2020-11-19 DIAGNOSIS — K7460 Unspecified cirrhosis of liver: Secondary | ICD-10-CM | POA: Diagnosis not present

## 2020-11-19 DIAGNOSIS — D61818 Other pancytopenia: Secondary | ICD-10-CM | POA: Insufficient documentation

## 2020-11-19 DIAGNOSIS — K648 Other hemorrhoids: Secondary | ICD-10-CM | POA: Diagnosis not present

## 2020-11-19 DIAGNOSIS — Z79899 Other long term (current) drug therapy: Secondary | ICD-10-CM | POA: Insufficient documentation

## 2020-11-19 DIAGNOSIS — D1779 Benign lipomatous neoplasm of other sites: Secondary | ICD-10-CM | POA: Insufficient documentation

## 2020-11-19 DIAGNOSIS — K644 Residual hemorrhoidal skin tags: Secondary | ICD-10-CM | POA: Insufficient documentation

## 2020-11-19 DIAGNOSIS — R911 Solitary pulmonary nodule: Secondary | ICD-10-CM | POA: Insufficient documentation

## 2020-11-19 DIAGNOSIS — C220 Liver cell carcinoma: Secondary | ICD-10-CM | POA: Diagnosis not present

## 2020-11-19 DIAGNOSIS — B1910 Unspecified viral hepatitis B without hepatic coma: Secondary | ICD-10-CM | POA: Diagnosis not present

## 2020-11-19 DIAGNOSIS — K621 Rectal polyp: Secondary | ICD-10-CM | POA: Diagnosis not present

## 2020-11-19 DIAGNOSIS — I8510 Secondary esophageal varices without bleeding: Secondary | ICD-10-CM | POA: Insufficient documentation

## 2020-11-19 MED ORDER — SODIUM CHLORIDE 0.9% IV SOLUTION
250.0000 mL | Freq: Once | INTRAVENOUS | Status: DC
  Filled 2020-11-19: qty 250

## 2020-11-19 NOTE — Patient Instructions (Signed)

## 2020-11-20 LAB — TYPE AND SCREEN
ABO/RH(D): B POS
Antibody Screen: NEGATIVE
Unit division: 0
Unit division: 0

## 2020-11-20 LAB — BPAM RBC
Blood Product Expiration Date: 202204282359
Blood Product Expiration Date: 202204282359
ISSUE DATE / TIME: 202204020818
ISSUE DATE / TIME: 202204020818
Unit Type and Rh: 7300
Unit Type and Rh: 7300

## 2020-11-21 ENCOUNTER — Other Ambulatory Visit: Payer: Self-pay

## 2020-11-21 ENCOUNTER — Telehealth: Payer: Self-pay | Admitting: Nurse Practitioner

## 2020-11-21 ENCOUNTER — Inpatient Hospital Stay: Payer: Medicare Other

## 2020-11-21 ENCOUNTER — Encounter: Payer: Self-pay | Admitting: Nurse Practitioner

## 2020-11-21 ENCOUNTER — Inpatient Hospital Stay: Payer: Medicare Other | Admitting: Nurse Practitioner

## 2020-11-21 VITALS — BP 146/73 | HR 74 | Temp 98.7°F | Resp 18 | Ht 69.0 in | Wt 183.2 lb

## 2020-11-21 DIAGNOSIS — C220 Liver cell carcinoma: Secondary | ICD-10-CM

## 2020-11-21 DIAGNOSIS — D500 Iron deficiency anemia secondary to blood loss (chronic): Secondary | ICD-10-CM | POA: Diagnosis not present

## 2020-11-21 DIAGNOSIS — I8510 Secondary esophageal varices without bleeding: Secondary | ICD-10-CM | POA: Diagnosis not present

## 2020-11-21 DIAGNOSIS — K621 Rectal polyp: Secondary | ICD-10-CM | POA: Diagnosis not present

## 2020-11-21 DIAGNOSIS — R911 Solitary pulmonary nodule: Secondary | ICD-10-CM | POA: Diagnosis not present

## 2020-11-21 DIAGNOSIS — D61818 Other pancytopenia: Secondary | ICD-10-CM | POA: Diagnosis not present

## 2020-11-21 DIAGNOSIS — Z79899 Other long term (current) drug therapy: Secondary | ICD-10-CM | POA: Diagnosis not present

## 2020-11-21 DIAGNOSIS — K644 Residual hemorrhoidal skin tags: Secondary | ICD-10-CM | POA: Diagnosis not present

## 2020-11-21 DIAGNOSIS — E119 Type 2 diabetes mellitus without complications: Secondary | ICD-10-CM | POA: Diagnosis not present

## 2020-11-21 DIAGNOSIS — D1779 Benign lipomatous neoplasm of other sites: Secondary | ICD-10-CM | POA: Diagnosis not present

## 2020-11-21 DIAGNOSIS — K7460 Unspecified cirrhosis of liver: Secondary | ICD-10-CM | POA: Diagnosis not present

## 2020-11-21 DIAGNOSIS — R161 Splenomegaly, not elsewhere classified: Secondary | ICD-10-CM | POA: Diagnosis not present

## 2020-11-21 DIAGNOSIS — K648 Other hemorrhoids: Secondary | ICD-10-CM | POA: Diagnosis not present

## 2020-11-21 LAB — CBC WITH DIFFERENTIAL (CANCER CENTER ONLY)
Abs Immature Granulocytes: 0 10*3/uL (ref 0.00–0.07)
Basophils Absolute: 0 10*3/uL (ref 0.0–0.1)
Basophils Relative: 1 %
Eosinophils Absolute: 0.1 10*3/uL (ref 0.0–0.5)
Eosinophils Relative: 2 %
HCT: 31.1 % — ABNORMAL LOW (ref 39.0–52.0)
Hemoglobin: 10 g/dL — ABNORMAL LOW (ref 13.0–17.0)
Immature Granulocytes: 0 %
Lymphocytes Relative: 24 %
Lymphs Abs: 0.9 10*3/uL (ref 0.7–4.0)
MCH: 29.2 pg (ref 26.0–34.0)
MCHC: 32.2 g/dL (ref 30.0–36.0)
MCV: 90.7 fL (ref 80.0–100.0)
Monocytes Absolute: 0.4 10*3/uL (ref 0.1–1.0)
Monocytes Relative: 10 %
Neutro Abs: 2.3 10*3/uL (ref 1.7–7.7)
Neutrophils Relative %: 63 %
Platelet Count: 68 10*3/uL — ABNORMAL LOW (ref 150–400)
RBC: 3.43 MIL/uL — ABNORMAL LOW (ref 4.22–5.81)
RDW: 17.9 % — ABNORMAL HIGH (ref 11.5–15.5)
WBC Count: 3.6 10*3/uL — ABNORMAL LOW (ref 4.0–10.5)
nRBC: 0 % (ref 0.0–0.2)

## 2020-11-21 LAB — FERRITIN: Ferritin: 86 ng/mL (ref 24–336)

## 2020-11-21 LAB — SAMPLE TO BLOOD BANK

## 2020-11-21 NOTE — Progress Notes (Signed)
[name] OFFICE PROGRESS NOTE   Diagnosis: Hepatocellular carcinoma, anemia  INTERVAL HISTORY:   [name] returns as scheduled.  He is accompanied by [name].  He was transfused 2 units of blood on 11/19/2020 for hemoglobin of 8.1.  He reports he felt like Edward blood was "low" just prior to the transfusion.  He mainly felt tired.  No shortness of breath.  He has resumed oral iron.  He notes stools are black.  Objective:  Vital signs in last 24 hours:  Blood pressure (!) 146/73, pulse 74, temperature 98.7 F (37.1 C), temperature source Tympanic, resp. rate 18, height 5\' 9"  (1.753 m), weight 183 lb 3.2 oz (83.1 kg), SpO2 99 %.    Resp: Lungs clear bilaterally. Cardio: Regular rate and rhythm. GI: Abdomen soft and nontender.  No hepatomegaly. Vascular: No leg edema.  Lab Results:  Lab Results  Component Value Date   WBC 3.0 (L) 11/17/2020   HGB 8.1 (L) 11/17/2020   HCT 25.5 (L) 11/17/2020   MCV 94.8 11/17/2020   PLT 61 (L) 11/17/2020   NEUTROABS 2.0 11/17/2020    Imaging:  No results found.  Medications: I have reviewed the patient's current medications.  Assessment/Plan: 1. Hepatocellular carcinoma, stage I (T1 NX) status post partial left hepatectomy 03/02/2013.  08/31/2013 AFP 2.4.   CT abdomen 08/31/2013 with postoperative changes of partial hepatectomy with small postoperative fluid collection along the resection margin. No definite signs to suggest residual or locally recurrent disease. Several borderline enlarged and minimally enlarged lymph nodes superior to the liver in the juxtapericardiac fat of the lower anterior mediastinum with the largest measuring 11 mm slightly increased compared to the prior study 01/15/2013. Several prominent non-pathologically enlarged upper abdominal ligament lymph nodes similar to the prior study. Small esophageal varices.   CT abdomen 03/03/2012 without evidence of recurrent hepatocellular carcinoma, stable  right lung base nodule  CT the abdomen and pelvis 09/06/2014 without evidence of recurrent hepatocellular carcinoma, slight enlargement of pre-cardiac lymph nodes  CT abdomen/pelvis 09/07/2015 with no findings for recurrent tumor or metastatic disease. Stable small 3 mm right middle lobe pulmonary nodule since 2014. Epicardial lymph node measured 11 mm, previously 13 mm. Stable cirrhotic changes. Fairly significant esophageal varices.  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 Cirrhosis. 3. Hepatitis B core antibody positive. 4. Diabetes. 5. Severe microcytic anemia-likely iron deficiency anemia-progressive 06/03/2017. Stool Hemoccult positive 3 06/05/2017.  6. Colonoscopy 12/27/2016-external and internal hemorrhoids. Diverticulosis in the sigmoid, descending and transverse colon. One medium polyp in the distal descending colon. 3 small polyps in the transverse colon. 2 diminutive polyps in the rectum and the proximal ascending colon. Medium-sized lipoma transverse colon. Small lipoma mid ascending colon. Multiplenonbleeding colonic angiodysplastic lesions. Pathology-tubular adenomas, hyperplastic polyps. 7. Pancytopenia secondary to cirrhosis and GI bleeding 8. Laparoscopic cholecystectomy 01/28/2018 9. Decreased iron stores 10/15/2018; transfused 2 units of blood 2/29/2020and 06/01/2020; Feraheme 11/04/2018 and 11/11/2018;Feraheme 02/18/2019 and 02/25/2019, 02/15/2020,02/29/2020,  06/10/2020, 06/17/2020, 09/21/2020, 09/28/2020   Disposition: [name] appears unchanged.  He was transfused 2 units of blood on 11/19/2020 due to symptomatic anemia.  We reviewed the CBC from today.  Hemoglobin is  10.  I reviewed with [name] and [name] that the anemia is likely due to chronic GI blood loss in the setting of advanced cirrhosis with esophageal varices.  Ferritin from today is pending.  He will continue oral iron.  We discussed follow-up with Dr. Watt Climes due to the ongoing GI blood loss/varices.  [name] [name] will contact gastroenterology to schedule a visit.  He will return here for a CBC in 3 weeks, office visit in 6 weeks.  Plan reviewed with Dr. Benay Spice.  Ned Card ANP/GNP-BC   11/21/2020  3:03 PM

## 2020-11-21 NOTE — Telephone Encounter (Signed)
Appointment scheduled with patient in exam room per 4/4 los

## 2020-12-08 ENCOUNTER — Ambulatory Visit: Payer: Medicare Other | Admitting: Adult Health

## 2020-12-12 ENCOUNTER — Inpatient Hospital Stay: Payer: Medicare Other

## 2020-12-12 ENCOUNTER — Other Ambulatory Visit: Payer: Self-pay

## 2020-12-12 DIAGNOSIS — R161 Splenomegaly, not elsewhere classified: Secondary | ICD-10-CM | POA: Diagnosis not present

## 2020-12-12 DIAGNOSIS — C220 Liver cell carcinoma: Secondary | ICD-10-CM

## 2020-12-12 DIAGNOSIS — D1779 Benign lipomatous neoplasm of other sites: Secondary | ICD-10-CM | POA: Diagnosis not present

## 2020-12-12 DIAGNOSIS — K644 Residual hemorrhoidal skin tags: Secondary | ICD-10-CM | POA: Diagnosis not present

## 2020-12-12 DIAGNOSIS — E119 Type 2 diabetes mellitus without complications: Secondary | ICD-10-CM | POA: Diagnosis not present

## 2020-12-12 DIAGNOSIS — K648 Other hemorrhoids: Secondary | ICD-10-CM | POA: Diagnosis not present

## 2020-12-12 DIAGNOSIS — K621 Rectal polyp: Secondary | ICD-10-CM | POA: Diagnosis not present

## 2020-12-12 DIAGNOSIS — D61818 Other pancytopenia: Secondary | ICD-10-CM | POA: Diagnosis not present

## 2020-12-12 DIAGNOSIS — D500 Iron deficiency anemia secondary to blood loss (chronic): Secondary | ICD-10-CM

## 2020-12-12 DIAGNOSIS — I8510 Secondary esophageal varices without bleeding: Secondary | ICD-10-CM | POA: Diagnosis not present

## 2020-12-12 DIAGNOSIS — K7460 Unspecified cirrhosis of liver: Secondary | ICD-10-CM | POA: Diagnosis not present

## 2020-12-12 DIAGNOSIS — Z79899 Other long term (current) drug therapy: Secondary | ICD-10-CM | POA: Diagnosis not present

## 2020-12-12 DIAGNOSIS — R911 Solitary pulmonary nodule: Secondary | ICD-10-CM | POA: Diagnosis not present

## 2020-12-12 LAB — CBC WITH DIFFERENTIAL (CANCER CENTER ONLY)
Abs Immature Granulocytes: 0 10*3/uL (ref 0.00–0.07)
Basophils Absolute: 0 10*3/uL (ref 0.0–0.1)
Basophils Relative: 0 %
Eosinophils Absolute: 0.1 10*3/uL (ref 0.0–0.5)
Eosinophils Relative: 2 %
HCT: 29.1 % — ABNORMAL LOW (ref 39.0–52.0)
Hemoglobin: 9.1 g/dL — ABNORMAL LOW (ref 13.0–17.0)
Immature Granulocytes: 0 %
Lymphocytes Relative: 21 %
Lymphs Abs: 0.7 10*3/uL (ref 0.7–4.0)
MCH: 30.2 pg (ref 26.0–34.0)
MCHC: 31.3 g/dL (ref 30.0–36.0)
MCV: 96.7 fL (ref 80.0–100.0)
Monocytes Absolute: 0.3 10*3/uL (ref 0.1–1.0)
Monocytes Relative: 10 %
Neutro Abs: 2.2 10*3/uL (ref 1.7–7.7)
Neutrophils Relative %: 67 %
Platelet Count: 65 10*3/uL — ABNORMAL LOW (ref 150–400)
RBC: 3.01 MIL/uL — ABNORMAL LOW (ref 4.22–5.81)
RDW: 18.3 % — ABNORMAL HIGH (ref 11.5–15.5)
WBC Count: 3.2 10*3/uL — ABNORMAL LOW (ref 4.0–10.5)
nRBC: 0 % (ref 0.0–0.2)

## 2020-12-12 LAB — FERRITIN: Ferritin: 26 ng/mL (ref 24–336)

## 2020-12-12 LAB — SAMPLE TO BLOOD BANK

## 2020-12-15 ENCOUNTER — Telehealth: Payer: Self-pay

## 2020-12-15 NOTE — Telephone Encounter (Signed)
Called son per provider, no answer message left to return call to this nurse for results and appts  Currently awaiting return call

## 2020-12-16 ENCOUNTER — Telehealth: Payer: Self-pay

## 2020-12-16 NOTE — Telephone Encounter (Signed)
Per provider spoke with son concerning most recent ferritin levels and provider recommendation for IV iron(Feraheme weekly x 2). Son([name] agrees with this)   Pt encouraged to call for any questions or changes

## 2021-01-02 ENCOUNTER — Inpatient Hospital Stay: Payer: Medicare Other | Attending: Oncology

## 2021-01-02 ENCOUNTER — Inpatient Hospital Stay: Payer: Medicare Other

## 2021-01-02 ENCOUNTER — Other Ambulatory Visit: Payer: Self-pay

## 2021-01-02 ENCOUNTER — Inpatient Hospital Stay (HOSPITAL_BASED_OUTPATIENT_CLINIC_OR_DEPARTMENT_OTHER): Payer: Medicare Other | Admitting: Oncology

## 2021-01-02 VITALS — BP 131/50 | HR 71 | Resp 18

## 2021-01-02 VITALS — BP 137/79 | HR 72 | Temp 97.8°F | Resp 18 | Ht 69.0 in | Wt 181.4 lb

## 2021-01-02 DIAGNOSIS — K922 Gastrointestinal hemorrhage, unspecified: Secondary | ICD-10-CM | POA: Insufficient documentation

## 2021-01-02 DIAGNOSIS — D500 Iron deficiency anemia secondary to blood loss (chronic): Secondary | ICD-10-CM

## 2021-01-02 DIAGNOSIS — D61818 Other pancytopenia: Secondary | ICD-10-CM | POA: Insufficient documentation

## 2021-01-02 DIAGNOSIS — B1910 Unspecified viral hepatitis B without hepatic coma: Secondary | ICD-10-CM | POA: Insufficient documentation

## 2021-01-02 DIAGNOSIS — Z79899 Other long term (current) drug therapy: Secondary | ICD-10-CM | POA: Diagnosis not present

## 2021-01-02 DIAGNOSIS — Z8505 Personal history of malignant neoplasm of liver: Secondary | ICD-10-CM | POA: Diagnosis not present

## 2021-01-02 DIAGNOSIS — K7460 Unspecified cirrhosis of liver: Secondary | ICD-10-CM | POA: Insufficient documentation

## 2021-01-02 DIAGNOSIS — C220 Liver cell carcinoma: Secondary | ICD-10-CM

## 2021-01-02 LAB — CBC WITH DIFFERENTIAL (CANCER CENTER ONLY)
Abs Immature Granulocytes: 0 10*3/uL (ref 0.00–0.07)
Basophils Absolute: 0 10*3/uL (ref 0.0–0.1)
Basophils Relative: 1 %
Eosinophils Absolute: 0 10*3/uL (ref 0.0–0.5)
Eosinophils Relative: 1 %
HCT: 28.9 % — ABNORMAL LOW (ref 39.0–52.0)
Hemoglobin: 8.8 g/dL — ABNORMAL LOW (ref 13.0–17.0)
Immature Granulocytes: 0 %
Lymphocytes Relative: 35 %
Lymphs Abs: 0.6 10*3/uL — ABNORMAL LOW (ref 0.7–4.0)
MCH: 30.4 pg (ref 26.0–34.0)
MCHC: 30.4 g/dL (ref 30.0–36.0)
MCV: 100 fL (ref 80.0–100.0)
Monocytes Absolute: 0.3 10*3/uL (ref 0.1–1.0)
Monocytes Relative: 16 %
Neutro Abs: 0.9 10*3/uL — ABNORMAL LOW (ref 1.7–7.7)
Neutrophils Relative %: 47 %
Platelet Count: 50 10*3/uL — ABNORMAL LOW (ref 150–400)
RBC: 2.89 MIL/uL — ABNORMAL LOW (ref 4.22–5.81)
RDW: 16.5 % — ABNORMAL HIGH (ref 11.5–15.5)
WBC Count: 1.8 10*3/uL — ABNORMAL LOW (ref 4.0–10.5)
nRBC: 0 % (ref 0.0–0.2)

## 2021-01-02 LAB — FERRITIN: Ferritin: 33 ng/mL (ref 24–336)

## 2021-01-02 MED ORDER — SODIUM CHLORIDE 0.9% FLUSH
3.0000 mL | Freq: Once | INTRAVENOUS | Status: DC | PRN
  Filled 2021-01-02: qty 10

## 2021-01-02 MED ORDER — SODIUM CHLORIDE 0.9 % IV SOLN
510.0000 mg | Freq: Once | INTRAVENOUS | Status: AC
  Administered 2021-01-02: 510 mg via INTRAVENOUS
  Filled 2021-01-02: qty 17

## 2021-01-02 MED ORDER — HEPARIN SOD (PORK) LOCK FLUSH 100 UNIT/ML IV SOLN
500.0000 [IU] | Freq: Once | INTRAVENOUS | Status: DC | PRN
  Filled 2021-01-02: qty 5

## 2021-01-02 MED ORDER — SODIUM CHLORIDE 0.9% FLUSH
10.0000 mL | Freq: Once | INTRAVENOUS | Status: DC | PRN
  Filled 2021-01-02: qty 10

## 2021-01-02 MED ORDER — SODIUM CHLORIDE 0.9 % IV SOLN
Freq: Once | INTRAVENOUS | Status: AC
  Filled 2021-01-02: qty 250

## 2021-01-02 MED ORDER — HEPARIN SOD (PORK) LOCK FLUSH 100 UNIT/ML IV SOLN
250.0000 [IU] | Freq: Once | INTRAVENOUS | Status: DC | PRN
  Filled 2021-01-02: qty 5

## 2021-01-02 NOTE — Patient Instructions (Signed)
Ferumoxytol injection What is this medicine? FERUMOXYTOL is an iron complex. Iron is used to make healthy red blood cells, which carry oxygen and nutrients throughout the body. This medicine is used to treat iron deficiency anemia. This medicine may be used for other purposes; ask your health care provider or pharmacist if you have questions. COMMON BRAND NAME(S): Feraheme What should I tell my health care provider before I take this medicine? They need to know if you have any of these conditions:  anemia not caused by low iron levels  high levels of iron in the blood  magnetic resonance imaging (MRI) test scheduled  an unusual or allergic reaction to iron, other medicines, foods, dyes, or preservatives  pregnant or trying to get pregnant  breast-feeding How should I use this medicine? This medicine is for injection into a vein. It is given by a health care professional in a hospital or clinic setting. Talk to your pediatrician regarding the use of this medicine in children. Special care may be needed. Overdosage: If you think you have taken too much of this medicine contact a poison control center or emergency room at once. NOTE: This medicine is only for you. Do not share this medicine with others. What if I miss a dose? It is important not to miss your dose. Call your doctor or health care professional if you are unable to keep an appointment. What may interact with this medicine? This medicine may interact with the following medications:  other iron products This list may not describe all possible interactions. Give your health care provider a list of all the medicines, herbs, non-prescription drugs, or dietary supplements you use. Also tell them if you smoke, drink alcohol, or use illegal drugs. Some items may interact with your medicine. What should I watch for while using this medicine? Visit your doctor or healthcare professional regularly. Tell your doctor or healthcare  professional if your symptoms do not start to get better or if they get worse. You may need blood work done while you are taking this medicine. You may need to follow a special diet. Talk to your doctor. Foods that contain iron include: whole grains/cereals, dried fruits, beans, or peas, leafy green vegetables, and organ meats (liver, kidney). What side effects may I notice from receiving this medicine? Side effects that you should report to your doctor or health care professional as soon as possible:  allergic reactions like skin rash, itching or hives, swelling of the face, lips, or tongue  breathing problems  changes in blood pressure  feeling faint or lightheaded, falls  fever or chills  flushing, sweating, or hot feelings  swelling of the ankles or feet Side effects that usually do not require medical attention (report to your doctor or health care professional if they continue or are bothersome):  diarrhea  headache  nausea, vomiting  stomach pain This list may not describe all possible side effects. Call your doctor for medical advice about side effects. You may report side effects to FDA at 1-800-FDA-1088. Where should I keep my medicine? This drug is given in a hospital or clinic and will not be stored at home. NOTE: This sheet is a summary. It may not cover all possible information. If you have questions about this medicine, talk to your doctor, pharmacist, or health care provider.  2021 Elsevier/Gold Standard (2016-09-24 20:21:10)  

## 2021-01-02 NOTE — Progress Notes (Signed)
[name] OFFICE PROGRESS NOTE   Diagnosis: Cirrhosis, pancytopenia, hepatocellular carcinoma  INTERVAL HISTORY:   [name] returns as scheduled.  He is here today with his son.  He received 2 units of packed red cells on 11/19/2020 after the hemoglobin returned at 8.1 on 11/17/2020.  [name] denies bleeding.  He feels "cold ".  No specific complaint.  Objective:  Vital signs in last 24 hours:  Blood pressure 137/79, pulse 72, temperature 97.8 F (36.6 C), temperature source Oral, resp. rate 18, height 5\' 9"  (1.753 m), weight 181 lb 6.4 oz (82.3 kg), SpO2 100 %.    Resp: Lungs clear bilaterally Cardio: Regular rate and rhythm GI: No apparent ascites, no mass, no hepatosplenomegaly Vascular: No leg edema  Lab Results:  Lab Results  Component Value Date   WBC 1.8 (L) 01/02/2021   HGB 8.8 (L) 01/02/2021   HCT 28.9 (L) 01/02/2021   MCV 100.0 01/02/2021   PLT 50 (L) 01/02/2021   NEUTROABS 0.9 (L) 01/02/2021    CMP  Lab Results  Component Value Date   NA 136 11/14/2020   K 3.8 11/14/2020   CL 106 11/14/2020   CO2 22 11/14/2020   GLUCOSE 280 (H) 11/14/2020   BUN 16 11/14/2020   CREATININE 1.03 11/14/2020   CALCIUM 8.5 (L) 11/14/2020   PROT 6.8 11/14/2020   ALBUMIN 3.0 (L) 11/14/2020   AST 60 (H) 11/14/2020   ALT 45 (H) 11/14/2020   ALKPHOS 108 11/14/2020   BILITOT 1.5 (H) 11/14/2020   GFRNONAA >60 11/14/2020   GFRAA 75 09/01/2020    Medications: I have reviewed the patient's current medications.   Assessment/Plan:  1. Hepatocellular carcinoma, stage I (T1 NX) status post partial left hepatectomy 03/02/2013.  08/31/2013 AFP 2.4.   CT abdomen 08/31/2013 with postoperative changes of partial hepatectomy with small postoperative fluid collection along the resection margin. No definite signs to suggest residual or locally recurrent disease. Several borderline enlarged and minimally enlarged lymph nodes superior to the liver in the  juxtapericardiac fat of the lower anterior mediastinum with the largest measuring 11 mm slightly increased compared to the prior study 01/15/2013. Several prominent non-pathologically enlarged upper abdominal ligament lymph nodes similar to the prior study. Small esophageal varices.   CT abdomen 03/03/2012 without evidence of recurrent hepatocellular carcinoma, stable right lung base nodule  CT the abdomen and pelvis 09/06/2014 without evidence of recurrent hepatocellular carcinoma, slight enlargement of pre-cardiac lymph nodes  CT abdomen/pelvis 09/07/2015 with no findings for recurrent tumor or metastatic disease. Stable small 3 mm right middle lobe pulmonary nodule since 2014. Epicardial lymph node measured 11 mm, previously 13 mm. Stable cirrhotic changes. Fairly significant esophageal varices.  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 Cirrhosis. 3. Hepatitis B core antibody positive. 4. Diabetes. 5. Severe microcytic anemia-likely iron deficiency anemia-progressive 06/03/2017. Stool Hemoccult positive 3 06/05/2017.  6. Colonoscopy 12/27/2016-external and internal hemorrhoids. Diverticulosis in the sigmoid, descending and transverse colon. One medium polyp in the distal descending colon. 3 small polyps in the transverse colon. 2 diminutive polyps in the rectum and the proximal ascending colon. Medium-sized lipoma transverse colon. Small lipoma mid ascending  colon.  Multiplenonbleeding colonic angiodysplastic lesions. Pathology-tubular adenomas, hyperplastic polyps. 7. Pancytopenia secondary to cirrhosis and GI bleeding 8. Laparoscopic cholecystectomy 01/28/2018 9. Decreased iron stores 10/15/2018; transfused 2 units of blood 2/29/2020and 06/01/2020; Feraheme 11/04/2018 and 11/11/2018;Feraheme 02/18/2019 and 02/25/2019, 02/15/2020,02/29/2020, 06/10/2020, 06/17/2020, 09/21/2020, 09/28/2020, 01/02/2021   Disposition: [name] has a history of iron deficiency anemia secondary to chronic GI blood loss.  He received IV iron in February.  The ferritin was low on 12/12/2020.  He will receive IV iron today and again in 1 week.  He was last transfused with packed red blood cells on 11/19/2020.  We will check a CBC when he is here next week and schedule Red cell transfusion support as needed.  [name] will return for an office visit in 1 month.  His son will contact us for symptoms of anemia.  We recommend he obtain another COVID-19 booster vaccine.  Betsy Coder, MD  01/02/2021  3:02 PM

## 2021-01-03 LAB — SAMPLE TO BLOOD BANK

## 2021-01-09 ENCOUNTER — Inpatient Hospital Stay: Payer: Medicare Other

## 2021-01-09 ENCOUNTER — Other Ambulatory Visit: Payer: Self-pay

## 2021-01-09 ENCOUNTER — Other Ambulatory Visit: Payer: Self-pay | Admitting: Family Medicine

## 2021-01-09 VITALS — BP 123/69 | HR 75 | Temp 97.2°F | Resp 18

## 2021-01-09 DIAGNOSIS — K7460 Unspecified cirrhosis of liver: Secondary | ICD-10-CM | POA: Diagnosis not present

## 2021-01-09 DIAGNOSIS — Z79899 Other long term (current) drug therapy: Secondary | ICD-10-CM | POA: Diagnosis not present

## 2021-01-09 DIAGNOSIS — Z8505 Personal history of malignant neoplasm of liver: Secondary | ICD-10-CM | POA: Diagnosis not present

## 2021-01-09 DIAGNOSIS — E1165 Type 2 diabetes mellitus with hyperglycemia: Secondary | ICD-10-CM

## 2021-01-09 DIAGNOSIS — D61818 Other pancytopenia: Secondary | ICD-10-CM | POA: Diagnosis not present

## 2021-01-09 DIAGNOSIS — K922 Gastrointestinal hemorrhage, unspecified: Secondary | ICD-10-CM | POA: Diagnosis not present

## 2021-01-09 DIAGNOSIS — D500 Iron deficiency anemia secondary to blood loss (chronic): Secondary | ICD-10-CM

## 2021-01-09 LAB — CBC WITH DIFFERENTIAL (CANCER CENTER ONLY)
Abs Immature Granulocytes: 0.01 10*3/uL (ref 0.00–0.07)
Basophils Absolute: 0 10*3/uL (ref 0.0–0.1)
Basophils Relative: 0 %
Eosinophils Absolute: 0 10*3/uL (ref 0.0–0.5)
Eosinophils Relative: 0 %
HCT: 32.1 % — ABNORMAL LOW (ref 39.0–52.0)
Hemoglobin: 10.1 g/dL — ABNORMAL LOW (ref 13.0–17.0)
Immature Granulocytes: 0 %
Lymphocytes Relative: 14 %
Lymphs Abs: 0.4 10*3/uL — ABNORMAL LOW (ref 0.7–4.0)
MCH: 30.8 pg (ref 26.0–34.0)
MCHC: 31.5 g/dL (ref 30.0–36.0)
MCV: 97.9 fL (ref 80.0–100.0)
Monocytes Absolute: 0.1 10*3/uL (ref 0.1–1.0)
Monocytes Relative: 4 %
Neutro Abs: 2.3 10*3/uL (ref 1.7–7.7)
Neutrophils Relative %: 82 %
Platelet Count: 83 10*3/uL — ABNORMAL LOW (ref 150–400)
RBC: 3.28 MIL/uL — ABNORMAL LOW (ref 4.22–5.81)
RDW: 16 % — ABNORMAL HIGH (ref 11.5–15.5)
WBC Count: 2.8 10*3/uL — ABNORMAL LOW (ref 4.0–10.5)
nRBC: 0 % (ref 0.0–0.2)

## 2021-01-09 LAB — SAMPLE TO BLOOD BANK

## 2021-01-09 MED ORDER — SODIUM CHLORIDE 0.9 % IV SOLN
510.0000 mg | Freq: Once | INTRAVENOUS | Status: AC
  Administered 2021-01-09: 510 mg via INTRAVENOUS
  Filled 2021-01-09: qty 17

## 2021-01-09 MED ORDER — SODIUM CHLORIDE 0.9 % IV SOLN
Freq: Once | INTRAVENOUS | Status: AC
  Filled 2021-01-09: qty 250

## 2021-01-13 ENCOUNTER — Ambulatory Visit: Payer: Medicare Other | Admitting: Family Medicine

## 2021-01-13 NOTE — Progress Notes (Deleted)
Subjective:  Patient ID: [name], male    DOB: 09/23/1945  Age: 75 y.o. MRN: [id number]  CC: No chief complaint on file.   HPI [name] presents for   Dementia with psychosis Last discussed in March.  He is followed by neurology.  Recommended starting Namenda as prescribed by neuro and continuing Seroquel.  Recommend evaluation with audiology regarding hearing aids has decreased sensory input may also have been contributing to his dementia difficulty.  Family was working on placement options.  Diabetes: With hyperglycemia, some potential difficulty with medications and adherence in the past, we have stopped sulfonylureas due to risk of hypoglycemia.  Was continued on metformin 500 mg twice daily at last visit, continued on statin.  Consistent dosing of medication for a few weeks when living with his son at her March visit. Microalbumin: *** Optho, foot exam, pneumovax: ***  Lab Results  Component Value Date   HGBA1C 7.3 (H) 09/01/2020   HGBA1C 8.0 (H) 04/14/2020   HGBA1C 6.6 (A) 12/25/2019   Lab Results  Component Value Date   MICROALBUR 0.8 04/19/2016   LDLCALC 66 04/14/2020   CREATININE 1.03 11/14/2020   Iron deficiency anemia with chronic blood loss, history of hepatocellular cancer. Oncology, Dr. Learta Codding,  Ned Card, NP. Receives iron infusions, last on May 23. ER visit noted from March 28 with dehydration, anemia, generalized weakness, treated with IV fluids. Lab Results  Component Value Date   WBC 2.8 (L) 01/09/2021   HGB 10.1 (L) 01/09/2021   HCT 32.1 (L) 01/09/2021   MCV 97.9 01/09/2021   PLT 83 (L) 01/09/2021   Lab Results  Component Value Date   FERRITIN 33 01/02/2021     History Patient Active Problem List   Diagnosis Date Noted  . Psychosis ([place]) 10/06/2020  . Iron deficiency anemia 11/04/2018  . Orthostatic hypotension 08/06/2018  . Hypertension 08/06/2018  . Hyperlipidemia 08/06/2018  . Pancytopenia ([place]) 08/06/2018  .  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 Insufficient treatment with nasal CPAP 05/05/2018  . 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 Irregular heart beat 01/28/2018  . Noncompliance with CPAP treatment 08/14/2017  . Excessive daytime sleepiness 04/09/2017  . Snoring 04/09/2017  . Memory impairment of gradual onset 04/09/2017  . Aortic atherosclerosis ([place]) 11/27/2016  . 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 Altered mental status 03/05/2013  . Respiratory failure, post-operative ([place]) 03/02/2013  . Hepatitis B 02/24/2013  . Cancer, hepatocellular ([place]) 02/10/2013  . Liver tumor-bleeding 01/15/2013  . Epidermal cyst 06/18/2012  . OSA (obstructive sleep apnea) 03/13/2012  . Abnormal leg movement 12/12/2011  . Blood pressure elevated 09/06/2011  . Diabetes mellitus ([place]) 09/06/2011  . 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 BPH (benign prostatic hyperplasia) 09/06/2011  . Diverticula of colon 09/06/2011   Past Medical History:  Diagnosis Date  . Cancer [facility])    hepatocellular cancer   . Diabetes mellitus without complication ([place])   . Dyspnea   . Hepatitis B   . Hyperlipidemia   . Hypertension   . Irregular heart beat 01/28/2018  . Narcolepsy    per office visit note of 08/2011   . Sleep apnea    cpap   Past Surgical History:  Procedure Laterality Date  . CHOLECYSTECTOMY N/A 01/28/2018   Procedure: LAPAROSCOPIC CHOLECYSTECTOMY;  Surgeon: [name], MD;  Location: [facility];  Service: General;  Laterality: N/A;  . COLONOSCOPY WITH PROPOFOL N/A 08/27/2017  Procedure: COLONOSCOPY WITH PROPOFOL;  Surgeon: Clarene Essex, MD;  Location: [facility];  Service: Endoscopy;  Laterality: N/A;  . ESOPHAGOGASTRODUODENOSCOPY (EGD) WITH PROPOFOL N/A 02/23/2020   Procedure: ESOPHAGOGASTRODUODENOSCOPY (EGD) WITH PROPOFOL;  Surgeon: Clarene Essex, MD;  Location: [facility];  Service: Endoscopy;  Laterality: N/A;  . HOT  HEMOSTASIS N/A 08/27/2017   Procedure: HOT HEMOSTASIS (ARGON PLASMA COAGULATION/BICAP);  Surgeon: Clarene Essex, MD;  Location: [facility];  Service: Endoscopy;  Laterality: N/A;  . LAPAROSCOPIC CHOLECYSTECTOMY  01/28/2018  . LAPAROSCOPY  03/02/2013   Procedure: LAPAROSCOPY DIAGNOSTIC;  Surgeon: [name], MD;  Location: [facility];  Service: General;;  . LIVER ULTRASOUND  03/02/2013   Procedure: LIVER ULTRASOUND;  Surgeon: [name], MD;  Location: [facility];  Service: General;;  . OPEN PARTIAL HEPATECTOMY   03/02/2013   Procedure: OPEN PARTIAL HEPATECTOMY [83];  Surgeon: [name], MD;  Location: [facility];  Service: General;;  DX LAPAROSCOPY, INTRAOPERATIVE LIVER ULTRASOUND, OPEN PARTIAL HEPATECTOMY  . pinched nerve in back     Allergies  Allergen Reactions  . Ace Inhibitors Swelling and Other (See Comments)    Angioedema - face.    Prior to Admission medications   Medication Sig Start Date End Date Taking? Authorizing Provider  donepezil (ARICEPT) 10 MG tablet Take 1 tablet (10 mg total) by mouth at bedtime. 10/20/20   Dohmeier, Asencion Partridge, MD  ferrous sulfate 325 (65 FE) MG tablet Take 325 mg by mouth 3 (three) times daily with meals.    [provider]  memantine (NAMENDA) 5 MG tablet Take 1 tablet (5 mg total) by mouth 2 (two) times daily. 10/20/20   Dohmeier, Asencion Partridge, MD  metFORMIN (GLUCOPHAGE) 500 MG tablet TAKE 1 TABLET BY MOUTH TWICE DAILY WITH A MEAL 01/09/21   Wendie Agreste, MD  Multiple Vitamin (MULTIVITAMIN WITH MINERALS) TABS tablet Take 1 tablet by mouth daily.    [provider]  pravastatin (PRAVACHOL) 20 MG tablet Take 1 tablet (20 mg total) by mouth at bedtime. 01/08/20   Wendie Agreste, MD  psyllium (KLS NATURAL PSYLLIUM FIBER) 58.6 % powder Take 1 packet by mouth daily.    [provider]  QUEtiapine (SEROQUEL) 25 MG tablet Take 0.5 tablets (12.5 mg total) by mouth at bedtime. 10/20/20   Dohmeier, Asencion Partridge, MD   Social History   Socioeconomic History   . Marital status: Divorced    Spouse name: Not on file  . Number of children: 2  . Years of education: 15+  . Highest education level: Bachelor's degree (e.g., BA, AB, BS)  Occupational History  . Occupation: Retired  Tobacco Use  . Smoking status: Former Smoker    Years: 5.00    Types: Cigarettes  . Smokeless tobacco: Never Used  Vaping Use  . Vaping Use: Never used  Substance and Sexual Activity  . Alcohol use: No  . Drug use: No  . Sexual activity: Not Currently  Other Topics Concern  . Not on file  Social History Narrative   Patient is single and lives alone- became widower when 1 child was 35 year old, divorced 2nd marriage   Has #2 grown children, not in area or involved in his life   Patient is retired.   Patient has a college education.   Patient is right-handed.   Patient drinks maybe two sodas daily.   Social Determinants of Health   Financial Resource Strain: Low Risk   . Difficulty of Paying Living Expenses: Not hard at all  Food Insecurity:  Food Insecurity Present  . Worried About Charity fundraiser in the Last Year: Sometimes true  . Ran Out of Food in the Last Year: Sometimes true  Transportation Needs: No Transportation Needs  . Lack of Transportation (Medical): No  . Lack of Transportation (Non-Medical): No  Physical Activity: Inactive  . Days of Exercise per Week: 0 days  . Minutes of Exercise per Session: 0 min  Stress: No Stress Concern Present  . Feeling of Stress : Not at all  Social Connections: Moderately Isolated  . Frequency of Communication with Friends and Family: More than three times a week  . Frequency of Social Gatherings with Friends and Family: More than three times a week  . Attends Religious Services: 1 to 4 times per year  . Active Member of Clubs or Organizations: No  . Attends Archivist Meetings: Never  . Marital Status: Divorced  Human resources officer Violence: Not At Risk  . Fear of Current or Ex-Partner: No  .  Emotionally Abused: No  . Physically Abused: No  . Sexually Abused: No    Review of Systems   Objective:  There were no vitals filed for this visit.   Physical Exam     Assessment & Plan:  [name] is a 75 y.o. male . No diagnosis found.   No orders of the defined types were placed in this encounter.  There are no Patient Instructions on file for this visit.    Signed, [name], MD [facility]

## 2021-01-19 ENCOUNTER — Telehealth: Payer: Self-pay

## 2021-01-19 DIAGNOSIS — F0392 Unspecified dementia, unspecified severity, with psychotic disturbance: Secondary | ICD-10-CM

## 2021-01-19 NOTE — Telephone Encounter (Signed)
[name] is calling in stating Lenardo has dementia and is getting worse. Has become belligerent, is walking out of his facility and catching busses to downtown. Family is very concerned for his safety has they have had to get the police involved to help find him on multiple occassions. [name] is wanting him to be admitted to a facility but doesn't know how to go about this.

## 2021-01-20 NOTE — Telephone Encounter (Signed)
Suanne Marker is calling back in regard.    States that Tobin is currently living with his son.  States that patient wondered off yesterday.  Took a bus downtown and could not be found for [employer].    States patient is currently having leg pain and walks with a cane.  States patient had been in a facility previously but was discharged by son.  Wanted to know if patient could get into a rehab facility with a memory care unit that could possibly transition into long term care.  I have asked if the family has looked into any facilities to check into room availability and insurance coverage.   She has stated they have not.  States patient has been taken to the hospital previously for leg pain.  States patient was assessed and sent home.    Would like to know what Dr. Carlota Raspberry suggested to be their next step.

## 2021-01-23 NOTE — Telephone Encounter (Signed)
Pt [name] reports no leg pain, and no hospital visit for leg pain. No acute changes/worsening but son is in need of help states he cannot keep pt from wondering and then pt forgets how to get home. Had to involve police while searching for him last week. Pt son needs memory care assistance or other help.

## 2021-01-23 NOTE — Telephone Encounter (Signed)
Please call and check status.  I do not see the notes from hospital evaluation for leg pain.  If he is still having leg pain that may need to be evaluated.  Also clarify if his memory changes have been acutely worsening, and if so he may be best seen through the ER for placement/evaluation options to make sure this is not a delirium as opposed to worsening dementia.  Let me know if so, I can reach out to them either later today or tomorrow.  Thanks.

## 2021-01-24 NOTE — Telephone Encounter (Signed)
He has been on aricept for many month, used to be for many year on stimulant medication, clinically presenting as narcoleptic at the time. 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 Memory care has been provided for the past 2-3 years.  Progressively worsening.

## 2021-01-24 NOTE — Telephone Encounter (Signed)
Called pt son no answer lm to return call when he is able to discuss questions from [place] message below

## 2021-01-24 NOTE — Telephone Encounter (Signed)
Pt son again states no leg issue to discuss but reports father has been taking the 3 named meds consistently

## 2021-01-24 NOTE — Telephone Encounter (Signed)
Has he been taking the Aricept, Namenda and Seroquel consistently? I will forward to prior social worker with [facility] to assist in placement. I will route to his neurologist as well as FYI, but would make sure he has been compliant with meds. Let's also get him an appointmet with me in the next week or two to discuss and check leg issue (he did not make recent scheduled appt). Thanks.

## 2021-01-25 NOTE — Telephone Encounter (Deleted)
Does he need a new referral? He was referred earlier this year and placement options were discussed then. My understanding is that you were just waiting on a call back with update in March.  I am happy to order a new referral if needed though. Thanks.  -JG

## 2021-01-30 ENCOUNTER — Inpatient Hospital Stay: Payer: Medicare Other | Attending: Oncology

## 2021-01-30 ENCOUNTER — Encounter: Payer: Self-pay | Admitting: Nurse Practitioner

## 2021-01-30 ENCOUNTER — Inpatient Hospital Stay (HOSPITAL_BASED_OUTPATIENT_CLINIC_OR_DEPARTMENT_OTHER): Payer: Medicare Other | Admitting: Nurse Practitioner

## 2021-01-30 ENCOUNTER — Other Ambulatory Visit: Payer: Self-pay

## 2021-01-30 VITALS — BP 134/66 | HR 78 | Temp 97.8°F | Resp 18 | Ht 69.0 in | Wt 176.0 lb

## 2021-01-30 DIAGNOSIS — D61818 Other pancytopenia: Secondary | ICD-10-CM | POA: Insufficient documentation

## 2021-01-30 DIAGNOSIS — Z79899 Other long term (current) drug therapy: Secondary | ICD-10-CM | POA: Insufficient documentation

## 2021-01-30 DIAGNOSIS — K7460 Unspecified cirrhosis of liver: Secondary | ICD-10-CM | POA: Insufficient documentation

## 2021-01-30 DIAGNOSIS — R161 Splenomegaly, not elsewhere classified: Secondary | ICD-10-CM | POA: Insufficient documentation

## 2021-01-30 DIAGNOSIS — K621 Rectal polyp: Secondary | ICD-10-CM | POA: Insufficient documentation

## 2021-01-30 DIAGNOSIS — I8510 Secondary esophageal varices without bleeding: Secondary | ICD-10-CM | POA: Diagnosis not present

## 2021-01-30 DIAGNOSIS — Z8505 Personal history of malignant neoplasm of liver: Secondary | ICD-10-CM | POA: Insufficient documentation

## 2021-01-30 DIAGNOSIS — K648 Other hemorrhoids: Secondary | ICD-10-CM | POA: Insufficient documentation

## 2021-01-30 DIAGNOSIS — C220 Liver cell carcinoma: Secondary | ICD-10-CM

## 2021-01-30 DIAGNOSIS — D500 Iron deficiency anemia secondary to blood loss (chronic): Secondary | ICD-10-CM

## 2021-01-30 DIAGNOSIS — K644 Residual hemorrhoidal skin tags: Secondary | ICD-10-CM | POA: Insufficient documentation

## 2021-01-30 DIAGNOSIS — E119 Type 2 diabetes mellitus without complications: Secondary | ICD-10-CM | POA: Diagnosis not present

## 2021-01-30 LAB — CBC WITH DIFFERENTIAL (CANCER CENTER ONLY)
Abs Immature Granulocytes: 0.01 10*3/uL (ref 0.00–0.07)
Basophils Absolute: 0 10*3/uL (ref 0.0–0.1)
Basophils Relative: 1 %
Eosinophils Absolute: 0.1 10*3/uL (ref 0.0–0.5)
Eosinophils Relative: 2 %
HCT: 29.7 % — ABNORMAL LOW (ref 39.0–52.0)
Hemoglobin: 9.5 g/dL — ABNORMAL LOW (ref 13.0–17.0)
Immature Granulocytes: 0 %
Lymphocytes Relative: 25 %
Lymphs Abs: 0.7 10*3/uL (ref 0.7–4.0)
MCH: 32.3 pg (ref 26.0–34.0)
MCHC: 32 g/dL (ref 30.0–36.0)
MCV: 101 fL — ABNORMAL HIGH (ref 80.0–100.0)
Monocytes Absolute: 0.3 10*3/uL (ref 0.1–1.0)
Monocytes Relative: 12 %
Neutro Abs: 1.7 10*3/uL (ref 1.7–7.7)
Neutrophils Relative %: 60 %
Platelet Count: 66 10*3/uL — ABNORMAL LOW (ref 150–400)
RBC: 2.94 MIL/uL — ABNORMAL LOW (ref 4.22–5.81)
RDW: 16.2 % — ABNORMAL HIGH (ref 11.5–15.5)
WBC Count: 2.8 10*3/uL — ABNORMAL LOW (ref 4.0–10.5)
nRBC: 0 % (ref 0.0–0.2)

## 2021-01-30 LAB — SAMPLE TO BLOOD BANK

## 2021-01-30 LAB — FERRITIN: Ferritin: 107 ng/mL (ref 24–336)

## 2021-01-30 NOTE — Progress Notes (Signed)
[facility] OFFICE PROGRESS NOTE   Diagnosis: Cirrhosis, pancytopenia, hepatocellular carcinoma  INTERVAL HISTORY:   [name] returns as scheduled.  He was transfused 2 units of blood 11/19/2020.  He received Feraheme 01/02/2021 and 01/09/2021.  He is accompanied to today's visit by his son.  His son confirms he is taking oral iron.  Not aware of any bleeding.  [name] continues to have significant memory issues.  Objective:  Vital signs in last 24 hours:  Blood pressure 134/66, pulse 78, temperature 97.8 F (36.6 C), temperature source Oral, resp. rate 18, height 5\' 9"  (1.753 m), weight 176 lb (79.8 kg), SpO2 100 %.   Resp: Lungs clear bilaterally. Cardio: Regular rate and rhythm. GI: No hepatosplenomegaly. Vascular: No leg edema. Neuro: Alert, follows commands.  Not oriented.   Lab Results:  Lab Results  Component Value Date   WBC 2.8 (L) 01/30/2021   HGB 9.5 (L) 01/30/2021   HCT 29.7 (L) 01/30/2021   MCV 101.0 (H) 01/30/2021   PLT 66 (L) 01/30/2021   NEUTROABS 1.7 01/30/2021    Imaging:  No results found.  Medications: I have reviewed the patient's current medications.  Assessment/Plan: Hepatocellular carcinoma, stage I (T1 NX) status post partial left hepatectomy 03/02/2013. 08/31/2013 AFP 2.4.   CT abdomen 08/31/2013 with postoperative changes of partial hepatectomy with small postoperative fluid collection along the resection margin. No definite signs to suggest residual or locally recurrent disease. Several borderline enlarged and minimally enlarged lymph nodes superior to the liver in the juxtapericardiac fat of the lower anterior mediastinum with the largest measuring 11 mm slightly increased compared to the prior study 01/15/2013. Several prominent non-pathologically enlarged upper abdominal ligament lymph nodes similar to the prior study. Small esophageal varices.   CT abdomen 03/03/2012 without evidence of recurrent hepatocellular  carcinoma, stable right lung base nodule CT the abdomen and pelvis 09/06/2014 without evidence of recurrent hepatocellular carcinoma, slight enlargement of pre-cardiac lymph nodes CT abdomen/pelvis 09/07/2015 with no findings for recurrent tumor or metastatic disease. Stable small 3 mm right middle lobe pulmonary nodule since 2014. Epicardial lymph node measured 11 mm, previously 13 mm. Stable cirrhotic changes. Fairly significant esophageal varices. 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 Hepatitis B core antibody positive. Diabetes. Severe microcytic anemia-likely iron deficiency anemia-progressive 06/03/2017. Stool Hemoccult positive 3 06/05/2017. Colonoscopy 12/27/2016-external and internal hemorrhoids. Diverticulosis in the sigmoid, descending and transverse colon. One medium polyp in the distal descending colon. 3 small polyps in the transverse colon. 2 diminutive polyps in the rectum and the proximal ascending colon. Medium-sized lipoma transverse colon. Small lipoma mid ascending colon. Multiple nonbleeding colonic angiodysplastic lesions. Pathology-tubular adenomas, hyperplastic polyps. Pancytopenia secondary to cirrhosis and GI bleeding Laparoscopic cholecystectomy 01/28/2018 Decreased iron stores 10/15/2018; transfused 2 units of blood 10/18/2018 and 06/01/2020; Feraheme 11/04/2018 and 11/11/2018; Feraheme 02/18/2019 and 02/25/2019, 02/15/2020, 02/29/2020, 06/10/2020, 06/17/2020,  09/21/2020, 09/28/2020, 01/02/2021      Disposition: [name] remains stable from a hematologic standpoint.  He will continue oral iron.  We will follow-up on the ferritin from today.  He will return for a CBC in 3 weeks.  We will see him in follow-up in 6  weeks.    Ned Card ANP/GNP-BC   01/30/2021  3:20 PM

## 2021-01-31 ENCOUNTER — Ambulatory Visit (INDEPENDENT_AMBULATORY_CARE_PROVIDER_SITE_OTHER): Payer: Medicare Other | Admitting: *Deleted

## 2021-01-31 ENCOUNTER — Telehealth: Payer: Medicare Other

## 2021-01-31 DIAGNOSIS — E1142 Type 2 diabetes mellitus with diabetic polyneuropathy: Secondary | ICD-10-CM | POA: Diagnosis not present

## 2021-01-31 DIAGNOSIS — E119 Type 2 diabetes mellitus without complications: Secondary | ICD-10-CM | POA: Diagnosis not present

## 2021-01-31 DIAGNOSIS — I10 Essential (primary) hypertension: Secondary | ICD-10-CM

## 2021-01-31 DIAGNOSIS — G3184 Mild cognitive impairment, so stated: Secondary | ICD-10-CM

## 2021-01-31 NOTE — Telephone Encounter (Signed)
Request for social work referral be placed in [place] for them to continue

## 2021-01-31 NOTE — Addendum Note (Signed)
Addended by: [name] R on: 01/31/2021 05:27 PM   Modules accepted: Orders

## 2021-02-01 NOTE — Patient Instructions (Signed)
Visit Information   PATIENT GOALS:   Goals Addressed             This Visit's Progress    To develop long term care plan for pt       Timeframe:  Long-Range Goal Priority:  High Start Date:      01/31/2021                       Expected End Date:   04/19/2021                    Follow Up Date 02/15/2021   -resolve financial barriers and apply for Medicaid -review list of Assisted Living facilities to be mailed to you -have your PCP complete FL2 form being mailed to you -research and tour facilities of interest -consider in-home support (private duty care)  - begin a notebook of services in my neighborhood or community - call 211 when I need some help - follow-up on any referrals for help I am given - think ahead to make sure my need does not become an emergency - make a note about what I need to have by the phone or take with me, like an identification card or social security number have a back-up plan - have a back-up plan - make a list of family or friends that I can call    Why is this important?   Knowing how and where to find help for yourself or family in your neighborhood and community is an important skill.  You will want to take some steps to learn how.    Notes:          Consent to CCM Services: Mr. Commisso was given information about Chronic Care Management services today including:  CCM service includes personalized support from designated clinical staff supervised by his physician, including individualized plan of care and coordination with other care providers 24/7 contact phone numbers for assistance for urgent and routine care needs. Service will only be billed when office clinical staff spend 20 minutes or more in a month to coordinate care. Only one practitioner may furnish and bill the service in a calendar month. The patient may stop CCM services at any time (effective at the end of the month) by phone call to the office staff. The patient will  be responsible for cost sharing (co-pay) of up to 20% of the service fee (after annual deductible is met).  Patient agreed to services and verbal consent obtained.   The patient verbalized understanding of instructions, educational materials, and care plan provided today and declined offer to receive copy of patient instructions, educational materials, and care plan.   Telephone follow up appointment with care management team member scheduled for: 02/15/2021 [name] MSW, LCSW Licensed Clinical Social Worker [place] [phone]   CLINICAL CARE PLAN: Patient Care Plan: LCSW Plan of Care     Problem Identified: Lack of knowledge for self management of long term care plans with cognitive impairment and inabilty to live independently   Priority: High     Long-Range Goal: Devlopment of long term care options/planning for patient with progressing cognitive impairment and inability to live independently   Start Date: 01/31/2021  Expected End Date: 04/19/2021  This Visit's Progress: On track  Priority: High  Note:   Current barriers:   Patient in need of assistance with connecting to community resources for Financial constraints related to obtaining Medicaid and/or placment at this time,  Level of care concerns, Limited access to caregiver, Cognitive Deficits, and Lacks knowledge of community resources Acknowledges deficits with meeting this unmet need Patient is unable to independently navigate community resource options without care coordination support Clinical Goals:  patient will work with SW to address concerns related to probable ALF/memory care placement Patient/son will follow up with completion of selling pt's home and applying for Medicaid as directed by SW Clinical Interventions:  CSW made contact with pt and pt's son, Etta Grandchild. Pt confirmed his identity and gave permission to speak with son; "Laverna Peace" as well.  Pt has been residing with his son for the past few  months- per son, pt was "hallucinating and not eating well or taking his medications as ordered". 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 He did recently wander away from home and the Police were able to locate him downtown- son reports he took the bus downtown. Pt is oriented to self, place and President today. CSW discussed possible placement and "a move to a home or place where he can get help and be around others". He is agreeable to the discussion and does repeat questions that were already answered.  CSW discussed with pt and son the plan to mail information to them on facilities to consider as well as FL2 to take to PCP on next office visit for completion.    CSW stressed to the son again (previously worked with pt/son and had advised them of pt's home being an asset that will be considered when applying for Medicaid) that the home will need to be "income producing" for Medicaid; (either sold and the profit used for his stay at facility or the home rented and the income used for his facility stay; both being a "spend down" and formula that DSS/medicaid will have to direct them through.   Pt has a niece in [place], [place], listed as his HCPOA. Advised pt and son of CSW's plan to also outreach her in the next 2 weeks to discuss her thoughts on long term planning for pt- although, if pt is competent he can make his own decisions. (Not certain of Financial POA- will inquire).   Son is planning a follow up PCP visit for pt- CSW will plan to follow up in the next 2 weeks regarding resources, financial updates,etc.  Collaboration with Wendie Agreste, MD regarding development and update of comprehensive plan of care as evidenced by provider attestation and co-signature Inter-disciplinary care team collaboration (see longitudinal plan of care) Assessment of needs, barriers , agencies contacted, as well as how impacting  Review various  resources, discussed options and provided patient information about Financial constraints related to placement, Level of care concerns, Limited access to caregiver, and Cognitive Deficits Collaborated with appropriate clinical care team members regarding patient needs Financial constraints related to placement, Level of care concerns, Limited access to caregiver, and Lacks knowledge of community resources to aide in providing support and care to pt, HTN and Dementia Referral placed via [place] [facility] Patient interviewed and appropriate assessments performed Assisted patient/caregiver with obtaining information about health plan benefits Provided education to patient/caregiver regarding level of care options. Other interventions provided: Solution-Focused Strategies, Active listening / Reflection utilized , Problem Solving [facility] , Motivational Interviewing, Caregiver stress acknowledged , Consideration of in-home help encouraged , and will assist with continued work towards placement (with financial and medical steps)  Patient Goals:  -resolve financial barriers  and apply for Medicaid -review list of Assisted Living facilities to be mailed to you -have your PCP complete FL2 form being mailed to you -research and tour facilities of interest -consider in-home support (private duty care)  - begin a notebook of services in my neighborhood or community - call 211 when I need some help - follow-up on any referrals for help I am given - think ahead to make sure my need does not become an emergency - make a note about what I need to have by the phone or take with me, like an identification card or social security number have a back-up plan - have a back-up plan - make a list of family or friends that I can call   Follow Up Plan: SW will follow up with patient by phone over the next 2 weeks

## 2021-02-01 NOTE — Chronic Care Management (AMB) (Signed)
Chronic Care Management    Clinical Social Work Note  02/01/2021 Name: [name] MRN: [id number] DOB: July 13, 1946  [name] is a 75 y.o. year old male who is a primary care patient of Carlota Raspberry, Ranell Patrick, MD. The CCM team was consulted to assist the patient with chronic disease management and/or care coordination needs related to: [employer] , Level of Care Concerns, and Caregiver Stress.   Engaged with patient by telephone for follow up visit in response to provider referral for social work chronic care management and care coordination services.   Consent to Services:  The patient was given information about Chronic Care Management services, agreed to services, and gave verbal consent prior to initiation of services.  Please see initial visit note for detailed documentation.   Patient agreed to services and consent obtained.   Assessment: Review of patient past medical history, allergies, medications, and health status, including review of relevant consultants reports was performed today as part of a comprehensive evaluation and provision of chronic care management and care coordination services.     SDOH (Social Determinants of Health) assessments and interventions performed:    Advanced Directives Status: Not addressed in this encounter.  CCM Care Plan  Allergies  Allergen Reactions   Ace Inhibitors Swelling and Other (See Comments)    Angioedema - face.     Outpatient Encounter Medications as of 01/31/2021  Medication Sig   donepezil (ARICEPT) 10 MG tablet Take 1 tablet (10 mg total) by mouth at bedtime.   ferrous sulfate 325 (65 FE) MG tablet Take 325 mg by mouth 3 (three) times daily with meals.   memantine (NAMENDA) 5 MG tablet Take 1 tablet (5 mg total) by mouth 2 (two) times daily.   metFORMIN (GLUCOPHAGE) 500 MG tablet TAKE 1 TABLET BY MOUTH TWICE DAILY WITH A MEAL   Multiple Vitamin (MULTIVITAMIN WITH MINERALS) TABS tablet Take 1 tablet by mouth  daily.   pravastatin (PRAVACHOL) 20 MG tablet Take 1 tablet (20 mg total) by mouth at bedtime.   psyllium (KLS NATURAL PSYLLIUM FIBER) 58.6 % powder Take 1 packet by mouth daily.   QUEtiapine (SEROQUEL) 25 MG tablet Take 0.5 tablets (12.5 mg total) by mouth at bedtime.   No facility-administered encounter medications on file as of 01/31/2021.    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   Irregular heart beat 01/28/2018   Noncompliance with CPAP treatment 08/14/2017   Excessive daytime sleepiness 04/09/2017   Snoring 04/09/2017   Memory impairment of gradual onset 04/09/2017   Aortic atherosclerosis ([place]) 11/27/2016   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 11/11/2013   Altered mental status 03/05/2013   Respiratory failure, post-operative ([place]) 03/02/2013   Hepatitis B 02/24/2013   Cancer, hepatocellular ([place]) 02/10/2013   Liver tumor-bleeding 01/15/2013   Epidermal cyst 06/18/2012   OSA (obstructive sleep apnea) 03/13/2012   Abnormal leg movement 12/12/2011   Blood pressure elevated 09/06/2011   Diabetes mellitus ([place]) 09/06/2011   Narcolepsy 09/06/2011   BPH (benign prostatic hyperplasia) 09/06/2011   Diverticula of colon 09/06/2011    Conditions to be addressed/monitored: HTN and Dementia; Level of care  concerns, Cognitive Deficits, Lacks knowledge of community resource: placement needs, and caregiver stress/support  Care Plan : LCSW Plan of Care  Updates made by Deirdre Peer, LCSW since 02/01/2021 12:00 AM     Problem: Lack of knowledge for self management of long  term care plans with cognitive impairment and inabilty to live independently   Priority: High     Long-Range Goal: Devlopment of long term care options/planning for patient with progressing cognitive impairment and inability to live independently   Start Date: 01/31/2021  Expected End Date: 04/19/2021  This Visit's Progress: On track  Priority: High  Note:   Current barriers:   Patient in need of assistance with connecting to community resources for Financial constraints related to obtaining Medicaid and/or placment at this time, Level of care concerns, Limited access to caregiver, Cognitive Deficits, and Lacks knowledge of community resources Acknowledges deficits with meeting this unmet need Patient is unable to independently navigate community resource options without care coordination support Clinical Goals:  patient will work with SW to address concerns related to probable ALF/memory care placement Patient/son will follow up with completion of selling pt's home and applying for Medicaid as directed by SW Clinical Interventions:  Collaboration with Carlota Raspberry, Ranell Patrick, MD regarding development and update of comprehensive plan of care as evidenced by provider attestation and co-signature Inter-disciplinary care team collaboration (see longitudinal plan of care) Assessment of needs, barriers , agencies contacted, as well as how impacting  Review various resources, discussed options and provided patient information about Financial constraints related to placement, Level of care concerns, Limited access to caregiver, and Cognitive Deficits Collaborated with appropriate clinical care team members regarding patient needs Financial constraints related to placement, Level of care concerns, Limited access to caregiver, and Lacks knowledge of community resources to aide in providing support and care to pt, HTN and Dementia Referral placed via [place] [facility] Patient interviewed and appropriate assessments  performed Assisted patient/caregiver with obtaining information about health plan benefits Provided education to patient/caregiver regarding level of care options. Other interventions provided: Solution-Focused Strategies, Active listening / Reflection utilized , Problem Solving [facility] , Motivational Interviewing, Caregiver stress acknowledged , Consideration of in-home help encouraged , and will assist with continued work towards placement (with financial and medical steps)  Patient Goals:  -resolve financial barriers and apply for Medicaid -review list of Assisted Living facilities to be mailed to you -have your PCP complete FL2 form being mailed to you -research and tour facilities of interest -consider in-home support (private duty care)  - begin a notebook of services in my neighborhood or community - call 211 when I need some help - follow-up on any referrals for help I am given - think ahead to make sure my need does not become an emergency - make a note about what I need to have by the phone or take with me, like an identification card or social security number have a back-up plan - have a back-up plan - make a list of family or friends that I can call   Follow Up Plan: SW will follow up with patient by phone over the next 2 weeks        Follow Up Plan: SW will follow up with patient by phone over the next 2 weeks      [name] MSW, LCSW Licensed Clinical Social Worker [employer] [phone]

## 2021-02-07 ENCOUNTER — Emergency Department (HOSPITAL_COMMUNITY)
Admission: EM | Admit: 2021-02-07 | Discharge: 2021-02-07 | Disposition: A | Discharge status: Home / Self Care | Payer: Medicare Other | Attending: Emergency Medicine | Admitting: Emergency Medicine

## 2021-02-07 DIAGNOSIS — Z7984 Long term (current) use of oral hypoglycemic drugs: Secondary | ICD-10-CM | POA: Diagnosis not present

## 2021-02-07 DIAGNOSIS — F0390 Unspecified dementia without behavioral disturbance: Secondary | ICD-10-CM

## 2021-02-07 DIAGNOSIS — E119 Type 2 diabetes mellitus without complications: Secondary | ICD-10-CM | POA: Diagnosis not present

## 2021-02-07 DIAGNOSIS — U071 COVID-19: Secondary | ICD-10-CM

## 2021-02-07 DIAGNOSIS — Z79899 Other long term (current) drug therapy: Secondary | ICD-10-CM | POA: Diagnosis not present

## 2021-02-07 DIAGNOSIS — I10 Essential (primary) hypertension: Secondary | ICD-10-CM | POA: Insufficient documentation

## 2021-02-07 DIAGNOSIS — Z87891 Personal history of nicotine dependence: Secondary | ICD-10-CM | POA: Insufficient documentation

## 2021-02-07 DIAGNOSIS — Z8505 Personal history of malignant neoplasm of liver: Secondary | ICD-10-CM | POA: Insufficient documentation

## 2021-02-07 DIAGNOSIS — R55 Syncope and collapse: Secondary | ICD-10-CM | POA: Diagnosis present

## 2021-02-07 LAB — CBC WITH DIFFERENTIAL/PLATELET
Abs Immature Granulocytes: 0.01 10*3/uL (ref 0.00–0.07)
Basophils Absolute: 0 10*3/uL (ref 0.0–0.1)
Basophils Relative: 1 %
Eosinophils Absolute: 0 10*3/uL (ref 0.0–0.5)
Eosinophils Relative: 0 %
HCT: 30.2 % — ABNORMAL LOW (ref 39.0–52.0)
Hemoglobin: 9.5 g/dL — ABNORMAL LOW (ref 13.0–17.0)
Immature Granulocytes: 0 %
Lymphocytes Relative: 10 %
Lymphs Abs: 0.3 10*3/uL — ABNORMAL LOW (ref 0.7–4.0)
MCH: 32.5 pg (ref 26.0–34.0)
MCHC: 31.5 g/dL (ref 30.0–36.0)
MCV: 103.4 fL — ABNORMAL HIGH (ref 80.0–100.0)
Monocytes Absolute: 0.2 10*3/uL (ref 0.1–1.0)
Monocytes Relative: 8 %
Neutro Abs: 2.3 10*3/uL (ref 1.7–7.7)
Neutrophils Relative %: 81 %
Platelets: 67 10*3/uL — ABNORMAL LOW (ref 150–400)
RBC: 2.92 MIL/uL — ABNORMAL LOW (ref 4.22–5.81)
RDW: 15.9 % — ABNORMAL HIGH (ref 11.5–15.5)
WBC: 2.8 10*3/uL — ABNORMAL LOW (ref 4.0–10.5)
nRBC: 0 % (ref 0.0–0.2)

## 2021-02-07 LAB — COMPREHENSIVE METABOLIC PANEL
ALT: 37 U/L (ref 0–44)
AST: 56 U/L — ABNORMAL HIGH (ref 15–41)
Albumin: 2.8 g/dL — ABNORMAL LOW (ref 3.5–5.0)
Alkaline Phosphatase: 90 U/L (ref 38–126)
Anion gap: 8 (ref 5–15)
BUN: 11 mg/dL (ref 8–23)
CO2: 19 mmol/L — ABNORMAL LOW (ref 22–32)
Calcium: 8.7 mg/dL — ABNORMAL LOW (ref 8.9–10.3)
Chloride: 112 mmol/L — ABNORMAL HIGH (ref 98–111)
Creatinine, Ser: 1.26 mg/dL — ABNORMAL HIGH (ref 0.61–1.24)
GFR, Estimated: 59 mL/min — ABNORMAL LOW (ref >60)
Glucose, Bld: 188 mg/dL — ABNORMAL HIGH (ref 70–99)
Potassium: 3.3 mmol/L — ABNORMAL LOW (ref 3.5–5.1)
Sodium: 139 mmol/L (ref 135–145)
Total Bilirubin: 1.2 mg/dL (ref 0.3–1.2)
Total Protein: 6.3 g/dL — ABNORMAL LOW (ref 6.5–8.1)

## 2021-02-07 LAB — RESP PANEL BY RT-PCR (FLU A&B, COVID) ARPGX2
Influenza A by PCR: NEGATIVE
Influenza B by PCR: NEGATIVE
SARS Coronavirus 2 by RT PCR: POSITIVE — AB

## 2021-02-07 MED ORDER — LACTATED RINGERS IV BOLUS
1000.0000 mL | Freq: Once | INTRAVENOUS | Status: AC
  Administered 2021-02-07: 1000 mL via INTRAVENOUS

## 2021-02-07 MED ORDER — LACTATED RINGERS IV SOLN: INTRAVENOUS | Status: DC

## 2021-02-07 NOTE — ED Notes (Signed)
EDP into room 

## 2021-02-07 NOTE — ED Triage Notes (Addendum)
Pt very HOH. EMS called by construction crew driving down the road. Noted pt on side of road. Unknown fall, unknown syncope, no complaints, some confusion/AMS, poor memory of events, "was walking from convenience store", admits to some memory issues/hx, here for safety issues per EMS, reports exam was inconsistent, sporadic.EKG unremarkable. Denies ETOH. Mentions fall, tripped b/c of left leg. Denies pain.

## 2021-02-07 NOTE — NC FL2 (Signed)
[place] LEVEL OF CARE SCREENING TOOL     IDENTIFICATION  Patient Name: [name] Birthdate: 1945-11-08 Sex: male Admission Date (Current Location): 02/07/2021  [facility] and [place] Number:  Herbalist and Address:  [facility] [place] [address], [place], [place] [address]      Provider Number: [id number]  Attending Physician Name and Address:  Lacretia Leigh, MD  Relative Name and Phone Number:  [name]   [phone]    Current Level of Care: Hospital Recommended Level of Care: Memory Care Prior Approval Number:    Date Approved/Denied:   PASRR Number:    Discharge Plan: Other (Comment) (memory care)    Current Diagnoses: Patient Active Problem List   Diagnosis Date Noted   Psychosis ([place]) 10/06/2020   Iron deficiency anemia 11/04/2018   Orthostatic hypotension 08/06/2018   Hypertension 08/06/2018   Hyperlipidemia 08/06/2018   Pancytopenia ([place]) 08/06/2018   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   Insufficient treatment with nasal CPAP 05/05/2018   MCI (mild cognitive impairment) with memory loss 05/05/2018   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   Irregular heart beat 01/28/2018   Noncompliance with CPAP treatment 08/14/2017   Excessive daytime sleepiness 04/09/2017   Snoring 04/09/2017   Memory impairment of gradual onset 04/09/2017   Aortic atherosclerosis ([place]) 11/27/2016   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 11/11/2013   Altered mental status 03/05/2013   Respiratory failure, post-operative ([place]) 03/02/2013   Hepatitis B 02/24/2013   Cancer, hepatocellular ([place]) 02/10/2013   Liver tumor-bleeding 01/15/2013   Epidermal cyst 06/18/2012   OSA (obstructive sleep apnea) 03/13/2012   Abnormal leg movement 12/12/2011   Blood pressure elevated 09/06/2011   Diabetes mellitus ([place]) 09/06/2011   Narcolepsy 09/06/2011   BPH (benign prostatic hyperplasia) 09/06/2011   Diverticula of  colon 09/06/2011    Orientation RESPIRATION BLADDER Height & Weight     Self, Situation  Normal Continent Weight:   Height:     BEHAVIORAL SYMPTOMS/MOOD NEUROLOGICAL BOWEL NUTRITION STATUS  Wanderer   Continent Diet (low sodium)  AMBULATORY STATUS COMMUNICATION OF NEEDS Skin   Independent Verbally Normal                       Personal Care Assistance Level of Assistance  Bathing, Feeding, Dressing Bathing Assistance: Independent Feeding assistance: Independent Dressing Assistance: Limited assistance     Functional Limitations Info  Sight, Hearing, Speech Sight Info: Adequate Hearing Info: Impaired Speech Info: Adequate    SPECIAL CARE FACTORS FREQUENCY                       Contractures Contractures Info: Not present    Additional Factors Info  Code Status Code Status Info: Full             Current Medications (02/07/2021):  This is the current hospital active medication list Current Facility-Administered Medications  Medication Dose Route Frequency Provider Last Rate Last Admin   lactated ringers bolus 1,000 mL  1,000 mL Intravenous Once Lacretia Leigh, MD       lactated ringers infusion   Intravenous Continuous Lacretia Leigh, MD       Current Outpatient Medications  Medication Sig Dispense Refill   donepezil (ARICEPT) 10 MG tablet Take 1 tablet (10 mg total) by mouth at bedtime. 90 tablet 3   ferrous sulfate 325 (65 FE) MG tablet Take 325 mg by mouth 3 (three) times daily with meals.  memantine (NAMENDA) 5 MG tablet Take 1 tablet (5 mg total) by mouth 2 (two) times daily. 60 tablet 5   metFORMIN (GLUCOPHAGE) 500 MG tablet TAKE 1 TABLET BY MOUTH TWICE DAILY WITH A MEAL 180 tablet 0   Multiple Vitamin (MULTIVITAMIN WITH MINERALS) TABS tablet Take 1 tablet by mouth daily.     pravastatin (PRAVACHOL) 20 MG tablet Take 1 tablet (20 mg total) by mouth at bedtime. 90 tablet 1   psyllium (KLS NATURAL PSYLLIUM FIBER) 58.6 % powder Take 1 packet by mouth  daily.     QUEtiapine (SEROQUEL) 25 MG tablet Take 0.5 tablets (12.5 mg total) by mouth at bedtime. 45 tablet 1     Discharge Medications: Please see discharge summary for a list of discharge medications.  Relevant Imaging Results:  Relevant Lab Results:   Additional Information SSN# [id number]/ Has had 2 Covid vaccines plus 1 booster  Arcadio Cope, LCSW

## 2021-02-07 NOTE — Social Work (Signed)
CSW met with Pt at bedside for consult for housing needs. Pt is pleasant and engaged oriented to self and place.    CSW contacted son, [name] @ [phone] to discuss case and was informed that Pt does not live alone in apartment, he resides with son.  Son has been searching for Pt since this afternoon when he wandered off.   Son is on his way to hospital.  CSW will fill out FL2 for memory care placement for family to take at d/c to assist with current search for appropriate next level of care.

## 2021-02-07 NOTE — ED Provider Notes (Signed)
[facility] EMERGENCY DEPARTMENT Provider Note   CSN: [id number] Arrival date & time: 02/07/21  1445     History Chief Complaint  Patient presents with   Fall   Near Syncope    [name] is a 75 y.o. male.  75 year old male presents via EMS after he was found walking outside.  Review the old chart shows patient has history of dementia and they are working on placement at this time.  Patient states that he was walking back from the store and states that his legs became weak.  He denies any LOC.  Denies any recent illnesses.  Patient is very hard of hearing and history is difficult.  No recent alcohol use.      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   Insufficient treatment with nasal CPAP 05/05/2018   MCI (mild cognitive impairment) with memory loss 05/05/2018   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   Irregular heart beat 01/28/2018   Noncompliance with CPAP treatment 08/14/2017   Excessive daytime sleepiness 04/09/2017   Snoring 04/09/2017   Memory impairment of gradual onset 04/09/2017   Aortic atherosclerosis ([place]) 11/27/2016   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 11/11/2013   Altered mental status 03/05/2013   Respiratory failure, post-operative ([place]) 03/02/2013   Hepatitis B 02/24/2013   Cancer, hepatocellular ([place]) 02/10/2013   Liver tumor-bleeding 01/15/2013   Epidermal cyst  06/18/2012   OSA (obstructive sleep apnea) 03/13/2012   Abnormal leg movement 12/12/2011   Blood pressure elevated 09/06/2011   Diabetes mellitus ([place]) 09/06/2011   Narcolepsy 09/06/2011   BPH (benign prostatic hyperplasia) 09/06/2011   Diverticula of colon 09/06/2011    Past Surgical History:  Procedure Laterality Date   CHOLECYSTECTOMY N/A 01/28/2018   Procedure: LAPAROSCOPIC CHOLECYSTECTOMY;  Surgeon: [name], MD;  Location: [place];  Service: General;  Laterality: N/A;   COLONOSCOPY WITH PROPOFOL N/A 08/27/2017   Procedure: COLONOSCOPY WITH PROPOFOL;  Surgeon: Clarene Essex, MD;  Location: [facility];  Service: Endoscopy;  Laterality: N/A;   ESOPHAGOGASTRODUODENOSCOPY (EGD) WITH PROPOFOL N/A 02/23/2020   Procedure: ESOPHAGOGASTRODUODENOSCOPY (EGD) WITH PROPOFOL;  Surgeon: Clarene Essex, MD;  Location: [facility];  Service: Endoscopy;  Laterality: N/A;   HOT HEMOSTASIS N/A 08/27/2017   Procedure: HOT HEMOSTASIS (ARGON PLASMA COAGULATION/BICAP);  Surgeon: Clarene Essex, MD;  Location: [facility];  Service: Endoscopy;  Laterality: N/A;   LAPAROSCOPIC CHOLECYSTECTOMY  01/28/2018   LAPAROSCOPY  03/02/2013   Procedure: LAPAROSCOPY DIAGNOSTIC;  Surgeon: [name], MD;  Location: [facility];  Service: General;;   LIVER ULTRASOUND  03/02/2013   Procedure: LIVER ULTRASOUND;  Surgeon: [name], MD;  Location: [facility];  Service: General;;   OPEN PARTIAL HEPATECTOMY   03/02/2013   Procedure: OPEN PARTIAL HEPATECTOMY [83];  Surgeon: [name], MD;  Location: [facility];  Service: General;;  DX LAPAROSCOPY, INTRAOPERATIVE LIVER ULTRASOUND, OPEN PARTIAL  HEPATECTOMY   pinched nerve in back         Family History  Problem Relation Age of Onset   Dementia Mother    Cancer Father    Diabetes Brother    Hypertension Brother    Cancer Brother     Social History   Tobacco Use   Smoking status: Former    Years: 5.00    Pack years: 0.00    Types: Cigarettes   Smokeless tobacco: Never  Vaping  Use   Vaping Use: Never used  Substance Use Topics   Alcohol use: No   Drug use: No    Home Medications Prior to Admission medications   Medication Sig Start Date End Date Taking? Authorizing Provider  donepezil (ARICEPT) 10 MG tablet Take 1 tablet (10 mg total) by mouth at bedtime. 10/20/20   Dohmeier, Asencion Partridge, MD  ferrous sulfate 325 (65 FE) MG tablet Take 325 mg by mouth 3 (three) times daily with meals.    [provider]  memantine (NAMENDA) 5 MG tablet Take 1 tablet (5 mg total) by mouth 2 (two) times daily. 10/20/20   Dohmeier, Asencion Partridge, MD  metFORMIN (GLUCOPHAGE) 500 MG tablet TAKE 1 TABLET BY MOUTH TWICE DAILY WITH A MEAL 01/09/21   Wendie Agreste, MD  Multiple Vitamin (MULTIVITAMIN WITH MINERALS) TABS tablet Take 1 tablet by mouth daily.    [provider]  pravastatin (PRAVACHOL) 20 MG tablet Take 1 tablet (20 mg total) by mouth at bedtime. 01/08/20   Wendie Agreste, MD  psyllium (KLS NATURAL PSYLLIUM FIBER) 58.6 % powder Take 1 packet by mouth daily.    [provider]  QUEtiapine (SEROQUEL) 25 MG tablet Take 0.5 tablets (12.5 mg total) by mouth at bedtime. 10/20/20   Dohmeier, Asencion Partridge, MD    Allergies    Ace inhibitors  Review of Systems   Review of Systems  Unable to perform ROS: Other   Physical Exam Updated Vital Signs SpO2 99%   Physical Exam Vitals and nursing note reviewed.  Constitutional:      General: He is not in acute distress.    Appearance: Normal appearance. He is well-developed. He is not toxic-appearing.  HENT:     Head: Normocephalic and atraumatic.  Eyes:     General: Lids are normal.     Conjunctiva/sclera: Conjunctivae normal.     Pupils: Pupils are equal, round, and reactive to light.  Neck:     Thyroid: No thyroid mass.     Trachea: No tracheal deviation.  Cardiovascular:     Rate and Rhythm: Normal rate and regular rhythm.     Heart sounds: Normal heart sounds. No murmur heard.   No gallop.  Pulmonary:      Effort: Pulmonary effort is normal. No respiratory distress.     Breath sounds: Normal breath sounds. No stridor. No decreased breath sounds, wheezing, rhonchi or rales.  Abdominal:     General: There is no distension.     Palpations: Abdomen is soft.     Tenderness: There is no abdominal tenderness. There is no rebound.  Musculoskeletal:        General: No tenderness. Normal range of motion.     Cervical back: Normal range of motion and neck supple.  Skin:    General: Skin is warm and dry.     Findings: No abrasion or rash.  Neurological:     Mental Status: He is alert and oriented to person, place, and time.  GCS: GCS eye subscore is 4. GCS verbal subscore is 5. GCS motor subscore is 6.     Cranial Nerves: Cranial nerves are intact. No cranial nerve deficit.     Sensory: No sensory deficit.     Motor: Motor function is intact.     Comments: Strength is 5 of 5 in upper as well as lower extremities  Psychiatric:        Attention and Perception: Attention normal.        Mood and Affect: Affect is flat.        Speech: Speech is delayed.        Behavior: Behavior is withdrawn.    ED Results / Procedures / Treatments   Labs (all labs ordered are listed, but only abnormal results are displayed) Labs Reviewed  URINE CULTURE  ETHANOL  CBC WITH DIFFERENTIAL/PLATELET  COMPREHENSIVE METABOLIC PANEL  URINALYSIS, ROUTINE W REFLEX MICROSCOPIC    EKG EKG Interpretation  Date/Time:  Tuesday February 07 2021 14:54:14 EDT Ventricular Rate:  102 PR Interval:  194 QRS Duration: 89 QT Interval:  355 QTC Calculation: 463 R Axis:   59 Text Interpretation: Sinus tachycardia Inferior infarct, age indeterminate Confirmed by Lacretia Leigh ([id number]) on 02/07/2021 3:25:25 PM  Radiology No results found.  Procedures Procedures   Medications Ordered in ED Medications - No data to display  ED Course  I have reviewed the triage vital signs and the nursing notes.  Pertinent labs & imaging  results that were available during my care of the patient were reviewed by me and considered in my medical decision making (see chart for details).    MDM Rules/Calculators/A&P                          Patient is COVID-positive here although he has not had any respiratory symptoms.  Had long discussion with patient's son who the patient lives with his states that his dad has been asymptomatic.  Patient has mentioned and occasionally does go out and wander around and this is consistent with with prior behavior.  Vital signs are stable here.  Does not qualify for any therapies at this time because we have no idea when he started having symptoms.  Labs are reassuring here and will discharge home Final Clinical Impression(s) / ED Diagnoses Final diagnoses:  None    Rx / DC Orders ED Discharge Orders     None        Lacretia Leigh, MD 02/07/21 [phone]

## 2021-02-08 LAB — PATHOLOGIST SMEAR REVIEW

## 2021-02-15 ENCOUNTER — Ambulatory Visit: Payer: Medicare Other | Admitting: *Deleted

## 2021-02-15 ENCOUNTER — Telehealth: Payer: Self-pay | Admitting: *Deleted

## 2021-02-15 DIAGNOSIS — F0392 Unspecified dementia, unspecified severity, with psychotic disturbance: Secondary | ICD-10-CM

## 2021-02-15 DIAGNOSIS — I10 Essential (primary) hypertension: Secondary | ICD-10-CM

## 2021-02-15 DIAGNOSIS — R413 Other amnesia: Secondary | ICD-10-CM

## 2021-02-15 NOTE — Telephone Encounter (Signed)
  Care Management   Follow Up Note   02/15/2021 Name: [name] MRN: [id number] DOB: 20-Jan-1946   Referred by: Wendie Agreste, MD Reason for referral : No chief complaint on file.   An unsuccessful telephone outreach was attempted today. The patient was referred to the case management team for assistance with care management and care coordination.   Follow Up Plan: The care management team will reach out to the patient again over the next 10 days.   [name] MSW, LCSW Licensed Clinical Social Worker [phone]

## 2021-02-16 NOTE — Patient Instructions (Signed)
Visit Information  PATIENT GOALS:  Goals Addressed             This Visit's Progress    To develop long term care plan for pt       Timeframe:  Long-Range Goal Priority:  High Start Date:      01/31/2021                       Expected End Date:   04/19/2021                    Follow Up Date 03/06/2021   -resolve financial barriers and apply for Medicaid -review list of Assisted Living facilities to be mailed to you -research and tour facilities of interest -consider in-home support (private duty care)  - begin a notebook of services in my neighborhood or community - call 211 when I need some help - follow-up on any referrals for help I am given - think ahead to make sure my need does not become an emergency - make a note about what I need to have by the phone or take with me, like an identification card or social security number have a back-up plan - have a back-up plan - make a list of family or friends that I can call    Why is this important?   Knowing how and where to find help for yourself or family in your neighborhood and community is an important skill.  You will want to take some steps to learn how.    Notes:          The patient verbalized understanding of instructions, educational materials, and care plan provided today and declined offer to receive copy of patient instructions, educational materials, and care plan.   Telephone follow up appointment with care management team member scheduled for:03/06/21  Angelyse Heslin MSW, LCSW Licensed Clinical Social Worker [phone]

## 2021-02-16 NOTE — Chronic Care Management (AMB) (Signed)
Chronic Care Management    Clinical Social Work Note  02/16/2021 Name: [name] MRN: [id number] DOB: [birth year]  [name] is a 75 y.o. year old male who is a primary care patient of Carlota Raspberry, Ranell Patrick, MD. The CCM team was consulted to assist the patient with chronic disease management and/or care coordination needs related to: [employer] , Level of Care Concerns, and Caregiver Stress.   Engaged with patient by telephone for follow up visit in response to provider referral for social work chronic care management and care coordination services.   Consent to Services:  The patient was given information about Chronic Care Management services, agreed to services, and gave verbal consent prior to initiation of services.  Please see initial visit note for detailed documentation.   Patient agreed to services and consent obtained.   Assessment: Review of patient past medical history, allergies, medications, and health status, including review of relevant consultants reports was performed today as part of a comprehensive evaluation and provision of chronic care management and care coordination services.     SDOH (Social Determinants of Health) assessments and interventions performed:    Advanced Directives Status: See Care Plan for related entries.  CCM Care Plan  Allergies  Allergen Reactions   Ace Inhibitors Swelling and Other (See Comments)    Angioedema - face.     Outpatient Encounter Medications as of 02/15/2021  Medication Sig   donepezil (ARICEPT) 10 MG tablet Take 1 tablet (10 mg total) by mouth at bedtime.   ferrous sulfate 325 (65 FE) MG tablet Take 325 mg by mouth 3 (three) times daily with meals.   memantine (NAMENDA) 5 MG tablet Take 1 tablet (5 mg total) by mouth 2 (two) times daily.   metFORMIN (GLUCOPHAGE) 500 MG tablet TAKE 1 TABLET BY MOUTH TWICE DAILY WITH A MEAL   Multiple Vitamin (MULTIVITAMIN WITH MINERALS) TABS tablet Take 1 tablet by  mouth daily.   pravastatin (PRAVACHOL) 20 MG tablet Take 1 tablet (20 mg total) by mouth at bedtime.   psyllium (KLS NATURAL PSYLLIUM FIBER) 58.6 % powder Take 1 packet by mouth daily.   QUEtiapine (SEROQUEL) 25 MG tablet Take 0.5 tablets (12.5 mg total) by mouth at bedtime.   No facility-administered encounter medications on file as of 02/15/2021.    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   Irregular heart beat 01/28/2018   Noncompliance with CPAP treatment 08/14/2017   Excessive daytime sleepiness 04/09/2017   Snoring 04/09/2017   Memory impairment of gradual onset 04/09/2017   Aortic atherosclerosis ([place]) 11/27/2016   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 11/11/2013   Altered mental status 03/05/2013   Respiratory failure, post-operative ([place]) 03/02/2013   Hepatitis B 02/24/2013   Cancer, hepatocellular ([place]) 02/10/2013   Liver tumor-bleeding 01/15/2013   Epidermal cyst 06/18/2012   OSA (obstructive sleep apnea) 03/13/2012   Abnormal leg movement 12/12/2011   Blood pressure elevated 09/06/2011   Diabetes mellitus ([place]) 09/06/2011   Narcolepsy 09/06/2011   BPH (benign prostatic hyperplasia) 09/06/2011   Diverticula of colon 09/06/2011    Conditions to be addressed/monitored: HTN, Dementia, and long term  care needs ; Level of care concerns, Cognitive Deficits, Memory Deficits, and Lacks knowledge of community resource: Medicaid, facility placement, etc  Care Plan : LCSW Plan of Care  Updates made by Deirdre Peer, LCSW since 02/16/2021 12:00 AM     Problem: Lack of  knowledge for self management of long term care plans with cognitive impairment and inabilty to live independently   Priority: High     Long-Range Goal: Devlopment of long term care options/planning for patient with progressing cognitive impairment and inability to live independently   Start Date: 01/31/2021  Expected End Date: 04/19/2021  This Visit's Progress: On track  Recent Progress: On track  Priority: High  Note:   Current barriers:   Patient in need of assistance with connecting to community resources for Financial constraints related to obtaining Medicaid and/or placment at this time, Level of care concerns, Limited access to caregiver, Cognitive Deficits, and Lacks knowledge of community resources Acknowledges deficits with meeting this unmet need Patient is unable to independently navigate community resource options without care coordination support Clinical Goals:  patient will work with SW to address concerns related to probable ALF/memory care placement Patient/son will follow up with completion of selling pt's home and applying for Medicaid as directed by SW Clinical Interventions:  CSW made contact with pt and pt's son, Etta Grandchild, who reports his father wandered away again yesterday and took the bus.  "Don't now if he just rode the bus or got off; family saw him get off bus and helped direct him the right direction to home.  Pt's son shared he has purchased a device for "tracking" and is planning to attach it to something (pt's belt,wallet, etc) for safety. Pt's son also shared updates on planning to sell pt's home; has had it appraised, for placement/Medicaid.   Collaboration with Wendie Agreste, MD regarding development and update of comprehensive plan of care as evidenced by provider attestation and co-signature Inter-disciplinary care team collaboration (see longitudinal plan of care) Assessment of needs, barriers , agencies contacted, as well as how impacting  Review  various resources, discussed options and provided patient information about Financial constraints related to placement, Level of care concerns, Limited access to caregiver, and Cognitive Deficits Collaborated with appropriate clinical care team members regarding patient needs Financial constraints related to placement, Level of care concerns, Limited access to caregiver, and Lacks knowledge of community resources to aide in providing support and care to pt, HTN and Dementia Referral placed via [facility] Patient interviewed and appropriate assessments performed Assisted patient/caregiver with obtaining information about health plan benefits Provided education to patient/caregiver regarding level of care options. Other interventions provided: Solution-Focused Strategies, Active listening / Reflection utilized , Problem Solving [facility] , Motivational Interviewing, Caregiver stress acknowledged , Consideration of in-home help encouraged , and will assist with continued work towards placement (with financial and medical steps)  Patient Goals:  -resolve financial barriers and apply for Medicaid -review list of Assisted Living facilities to be mailed to you -research and tour facilities of interest -consider in-home support (private duty care)  - begin a notebook of services in my neighborhood or community - call 211 when I need some help - follow-up on any referrals for help I am given - think ahead to make sure my need does not become an emergency - make a note about what I need to have by the phone or take with me, like an identification card or social security number have a back-up  plan - have a back-up plan - make a list of family or friends that I can call  -  Follow Up Plan: Appointment scheduled for SW follow up with client by phone on:  03/06/21      Follow Up Plan: Appointment scheduled for SW follow up with client by phone on:       [name] MSW, LCSW Licensed Clinical  Social Worker [phone]

## 2021-02-17 ENCOUNTER — Encounter (HOSPITAL_COMMUNITY): Payer: Self-pay | Admitting: Emergency Medicine

## 2021-02-17 ENCOUNTER — Other Ambulatory Visit: Payer: Self-pay

## 2021-02-17 ENCOUNTER — Observation Stay (HOSPITAL_COMMUNITY)
Admission: EM | Admit: 2021-02-17 | Discharge: 2021-03-16 | Disposition: A | Discharge status: Home / Self Care | Payer: Medicare Other | Attending: Internal Medicine | Admitting: Internal Medicine

## 2021-02-17 DIAGNOSIS — Z20822 Contact with and (suspected) exposure to covid-19: Secondary | ICD-10-CM | POA: Diagnosis not present

## 2021-02-17 DIAGNOSIS — N289 Disorder of kidney and ureter, unspecified: Secondary | ICD-10-CM | POA: Diagnosis not present

## 2021-02-17 DIAGNOSIS — R2681 Unsteadiness on feet: Secondary | ICD-10-CM | POA: Insufficient documentation

## 2021-02-17 DIAGNOSIS — F0390 Unspecified dementia without behavioral disturbance: Secondary | ICD-10-CM

## 2021-02-17 DIAGNOSIS — Z79899 Other long term (current) drug therapy: Secondary | ICD-10-CM | POA: Insufficient documentation

## 2021-02-17 DIAGNOSIS — Z7984 Long term (current) use of oral hypoglycemic drugs: Secondary | ICD-10-CM | POA: Insufficient documentation

## 2021-02-17 DIAGNOSIS — Z23 Encounter for immunization: Secondary | ICD-10-CM | POA: Insufficient documentation

## 2021-02-17 DIAGNOSIS — N179 Acute kidney failure, unspecified: Secondary | ICD-10-CM | POA: Insufficient documentation

## 2021-02-17 DIAGNOSIS — R413 Other amnesia: Secondary | ICD-10-CM | POA: Diagnosis present

## 2021-02-17 DIAGNOSIS — F0391 Unspecified dementia with behavioral disturbance: Principal | ICD-10-CM

## 2021-02-17 DIAGNOSIS — Z596 Low income: Secondary | ICD-10-CM | POA: Diagnosis not present

## 2021-02-17 DIAGNOSIS — E119 Type 2 diabetes mellitus without complications: Secondary | ICD-10-CM | POA: Diagnosis not present

## 2021-02-17 DIAGNOSIS — E440 Moderate protein-calorie malnutrition: Secondary | ICD-10-CM | POA: Insufficient documentation

## 2021-02-17 DIAGNOSIS — E860 Dehydration: Secondary | ICD-10-CM | POA: Diagnosis present

## 2021-02-17 DIAGNOSIS — D61818 Other pancytopenia: Secondary | ICD-10-CM | POA: Diagnosis not present

## 2021-02-17 DIAGNOSIS — R4689 Other symptoms and signs involving appearance and behavior: Secondary | ICD-10-CM

## 2021-02-17 DIAGNOSIS — I10 Essential (primary) hypertension: Secondary | ICD-10-CM | POA: Insufficient documentation

## 2021-02-17 DIAGNOSIS — G4733 Obstructive sleep apnea (adult) (pediatric): Secondary | ICD-10-CM | POA: Diagnosis present

## 2021-02-17 DIAGNOSIS — C220 Liver cell carcinoma: Secondary | ICD-10-CM | POA: Diagnosis present

## 2021-02-17 DIAGNOSIS — Z8505 Personal history of malignant neoplasm of liver: Secondary | ICD-10-CM | POA: Insufficient documentation

## 2021-02-17 DIAGNOSIS — Z87891 Personal history of nicotine dependence: Secondary | ICD-10-CM | POA: Insufficient documentation

## 2021-02-17 DIAGNOSIS — F29 Unspecified psychosis not due to a substance or known physiological condition: Secondary | ICD-10-CM | POA: Diagnosis present

## 2021-02-17 DIAGNOSIS — Z5941 Food insecurity: Secondary | ICD-10-CM | POA: Diagnosis not present

## 2021-02-17 LAB — CBC WITH DIFFERENTIAL/PLATELET
Abs Immature Granulocytes: 0.01 10*3/uL (ref 0.00–0.07)
Basophils Absolute: 0 10*3/uL (ref 0.0–0.1)
Basophils Relative: 1 %
Eosinophils Absolute: 0 10*3/uL (ref 0.0–0.5)
Eosinophils Relative: 1 %
HCT: 27.4 % — ABNORMAL LOW (ref 39.0–52.0)
Hemoglobin: 8.6 g/dL — ABNORMAL LOW (ref 13.0–17.0)
Immature Granulocytes: 0 %
Lymphocytes Relative: 19 %
Lymphs Abs: 0.6 10*3/uL — ABNORMAL LOW (ref 0.7–4.0)
MCH: 32.8 pg (ref 26.0–34.0)
MCHC: 31.4 g/dL (ref 30.0–36.0)
MCV: 104.6 fL — ABNORMAL HIGH (ref 80.0–100.0)
Monocytes Absolute: 0.3 10*3/uL (ref 0.1–1.0)
Monocytes Relative: 9 %
Neutro Abs: 2.2 10*3/uL (ref 1.7–7.7)
Neutrophils Relative %: 70 %
Platelets: 62 10*3/uL — ABNORMAL LOW (ref 150–400)
RBC: 2.62 MIL/uL — ABNORMAL LOW (ref 4.22–5.81)
RDW: 15.4 % (ref 11.5–15.5)
WBC: 3 10*3/uL — ABNORMAL LOW (ref 4.0–10.5)
nRBC: 0 % (ref 0.0–0.2)

## 2021-02-17 LAB — BASIC METABOLIC PANEL
Anion gap: 8 (ref 5–15)
BUN: 17 mg/dL (ref 8–23)
CO2: 18 mmol/L — ABNORMAL LOW (ref 22–32)
Calcium: 8.5 mg/dL — ABNORMAL LOW (ref 8.9–10.3)
Chloride: 118 mmol/L — ABNORMAL HIGH (ref 98–111)
Creatinine, Ser: 1.38 mg/dL — ABNORMAL HIGH (ref 0.61–1.24)
GFR, Estimated: 53 mL/min — ABNORMAL LOW (ref >60)
Glucose, Bld: 89 mg/dL (ref 70–99)
Potassium: 4.3 mmol/L (ref 3.5–5.1)
Sodium: 144 mmol/L (ref 135–145)

## 2021-02-17 LAB — URINALYSIS, ROUTINE W REFLEX MICROSCOPIC
Bilirubin Urine: NEGATIVE
Glucose, UA: NEGATIVE mg/dL
Hgb urine dipstick: NEGATIVE
Ketones, ur: 5 mg/dL — AB
Leukocytes,Ua: NEGATIVE
Nitrite: NEGATIVE
Protein, ur: NEGATIVE mg/dL
Specific Gravity, Urine: 1.02 (ref 1.005–1.030)
pH: 5 (ref 5.0–8.0)

## 2021-02-17 NOTE — ED Triage Notes (Signed)
Pt BIB [facility] from home where he lives with family, hx dementia, family reports pt had been outside since 10am, refusing to come inside and becoming combative at times. Family also reports pt has had increased wandering as well. HOH, cooperative with staff at this time. EMS reports pt initially HR 130s, decreased to 90s with fluids.

## 2021-02-17 NOTE — ED Provider Notes (Signed)
[facility] EMERGENCY DEPARTMENT Provider Note   CSN: [id number] Arrival date & time: 02/17/21  1821     History Chief Complaint  Patient presents with   Aggressive Behavior    [name] is a 75 y.o. male. Level 5 caveat due to dementia. HPI Patient brought in for mental status changes.  Reportedly been outside.  Became more aggressive did not want to go inside.  Increased wandering.  History of dementia that is apparently been worsening.  However found to have heart rate 130 with EMS.  Patient really cannot provide much history.  Does not know why is here.  States family is trying to take away his car and his money but states he does not have any money.    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   Insufficient treatment with nasal CPAP 05/05/2018   MCI (mild cognitive impairment) with memory loss 05/05/2018   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   Irregular heart beat 01/28/2018   Noncompliance with CPAP treatment 08/14/2017   Excessive daytime sleepiness 04/09/2017   Snoring 04/09/2017   Memory impairment of gradual onset 04/09/2017   Aortic atherosclerosis ([place]) 11/27/2016   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 11/11/2013   Altered mental status 03/05/2013   Respiratory failure, post-operative ([place]) 03/02/2013   Hepatitis B 02/24/2013   Cancer, hepatocellular ([place]) 02/10/2013   Liver  tumor-bleeding 01/15/2013   Epidermal cyst 06/18/2012   OSA (obstructive sleep apnea) 03/13/2012   Abnormal leg movement 12/12/2011   Blood pressure elevated 09/06/2011   Diabetes mellitus ([place]) 09/06/2011   Narcolepsy 09/06/2011   BPH (benign prostatic hyperplasia) 09/06/2011   Diverticula of colon 09/06/2011    Past Surgical History:  Procedure Laterality Date   CHOLECYSTECTOMY N/A 01/28/2018   Procedure: LAPAROSCOPIC CHOLECYSTECTOMY;  Surgeon: [name], MD;  Location: [place];  Service: General;  Laterality: N/A;   COLONOSCOPY WITH PROPOFOL N/A 08/27/2017   Procedure: COLONOSCOPY WITH PROPOFOL;  Surgeon: Clarene Essex, MD;  [facility]: [facility];  Service: Endoscopy;  Laterality: N/A;   ESOPHAGOGASTRODUODENOSCOPY (EGD) WITH PROPOFOL N/A 02/23/2020   Procedure: ESOPHAGOGASTRODUODENOSCOPY (EGD) WITH PROPOFOL;  Surgeon: Clarene Essex, MD;  [facility];  Service: Endoscopy;  Laterality: N/A;   HOT HEMOSTASIS N/A 08/27/2017   Procedure: HOT HEMOSTASIS (ARGON PLASMA COAGULATION/BICAP);  Surgeon: Clarene Essex, MD;  Location: [facility];  Service: Endoscopy;  Laterality: N/A;   LAPAROSCOPIC CHOLECYSTECTOMY  01/28/2018   LAPAROSCOPY  03/02/2013   Procedure: LAPAROSCOPY DIAGNOSTIC;  Surgeon: [name], MD;  [facility];  Service: General;;   LIVER ULTRASOUND  03/02/2013   Procedure: LIVER ULTRASOUND;  Surgeon: [name], MD;  [facility];  Service: General;;   OPEN PARTIAL HEPATECTOMY   03/02/2013   Procedure: OPEN PARTIAL HEPATECTOMY [83];  Surgeon: [name], MD;  [facility];  Service: General;;  DX LAPAROSCOPY, INTRAOPERATIVE  LIVER ULTRASOUND, OPEN PARTIAL HEPATECTOMY   pinched nerve in back         Family History  Problem Relation Age of Onset   Dementia Mother    Cancer Father    Diabetes Brother    Hypertension Brother    Cancer Brother     Social History   Tobacco Use   Smoking status: Former    Years: 5.00    Pack years: 0.00    Types:  Cigarettes   Smokeless tobacco: Never  Vaping Use   Vaping Use: Never used  Substance Use Topics   Alcohol use: No   Drug use: No    Home Medications Prior to Admission medications   Medication Sig Start Date End Date Taking? Authorizing Provider  donepezil (ARICEPT) 10 MG tablet Take 1 tablet (10 mg total) by mouth at bedtime. 10/20/20   Dohmeier, Asencion Partridge, MD  ferrous sulfate 325 (65 FE) MG tablet Take 325 mg by mouth 3 (three) times daily with meals.    [provider]  memantine (NAMENDA) 5 MG tablet Take 1 tablet (5 mg total) by mouth 2 (two) times daily. 10/20/20   Dohmeier, Asencion Partridge, MD  metFORMIN (GLUCOPHAGE) 500 MG tablet TAKE 1 TABLET BY MOUTH TWICE DAILY WITH A MEAL 01/09/21   Wendie Agreste, MD  Multiple Vitamin (MULTIVITAMIN WITH MINERALS) TABS tablet Take 1 tablet by mouth daily.    [provider]  pravastatin (PRAVACHOL) 20 MG tablet Take 1 tablet (20 mg total) by mouth at bedtime. 01/08/20   Wendie Agreste, MD  psyllium (KLS NATURAL PSYLLIUM FIBER) 58.6 % powder Take 1 packet by mouth daily.    [provider]  QUEtiapine (SEROQUEL) 25 MG tablet Take 0.5 tablets (12.5 mg total) by mouth at bedtime. 10/20/20   Dohmeier, Asencion Partridge, MD    Allergies    Ace inhibitors  Review of Systems   Review of Systems  Unable to perform ROS: Dementia   Physical Exam Updated Vital Signs BP (!) 144/70 (BP Location: Right Arm)   Pulse 81   Temp 99.2 F (37.3 C) (Rectal)   Resp 16   SpO2 100%   Physical Exam Vitals and nursing note reviewed.  HENT:     Head: Normocephalic.  Eyes:     General: No scleral icterus.    Pupils: Pupils are equal, round, and reactive to light.  Cardiovascular:     Rate and Rhythm: Regular rhythm.  Abdominal:     Tenderness: There is no abdominal tenderness.     Hernia: No hernia is present.  Musculoskeletal:        General: No tenderness.     Cervical back: Neck supple.  Skin:    General: Skin is warm.     Capillary  Refill: Capillary refill takes less than 2 seconds.     Comments: A  Neurological:     Mental Status: He is alert.     Comments: Awake and pleasant but some confusion.  Not combative for me.  Really cannot explain why he is here that however.    ED Results / Procedures / Treatments   Labs (all labs ordered are listed, but only abnormal results are displayed) Labs Reviewed  URINALYSIS, ROUTINE W REFLEX MICROSCOPIC - Abnormal; Notable for the following components:      Result Value   Color, Urine AMBER (*)    Ketones, ur 5 (*)    All other components within normal limits  CBC WITH DIFFERENTIAL/PLATELET - Abnormal; Notable  for the following components:   WBC 3.0 (*)    RBC 2.62 (*)    Hemoglobin 8.6 (*)    HCT 27.4 (*)    MCV 104.6 (*)    Platelets 62 (*)    Lymphs Abs 0.6 (*)    All other components within normal limits  BASIC METABOLIC PANEL - Abnormal; Notable for the following components:   Chloride 118 (*)    CO2 18 (*)    Creatinine, Ser 1.38 (*)    Calcium 8.5 (*)    GFR, Estimated 53 (*)    All other components within normal limits    EKG None  Radiology No results found.  Procedures Procedures   Medications Ordered in ED Medications - No data to display  ED Course  I have reviewed the triage vital signs and the nursing notes.  Pertinent labs & imaging results that were available during my care of the patient were reviewed by me and considered in my medical decision making (see chart for details).    MDM Rules/Calculators/A&P                          Patient with worsening mental status.  Becoming more combative.  Does have dementia and I think this is likely the cause, however he is worsening CBC.  History of pancytopenia but worsening anemia.  Hemoglobin down to 8.6 now previously been this low and required iron transfusions.  Really cannot get much history from the patient about how he is feeling, although did have a heart rate of 130 for EMS.  May  benefit for further management of this anemia.  Will discuss with hospitalist. Also with worsening dementia may need further evaluation about his home status.  Reportedly has been combative there. Final Clinical Impression(s) / ED Diagnoses Final diagnoses:  Dementia with behavioral disturbance, unspecified dementia type ([place])  Aggressive behavior  Pancytopenia ([place])    Rx / DC Orders ED Discharge Orders     None        Davonna Belling, MD 02/17/21 2348

## 2021-02-18 ENCOUNTER — Encounter (HOSPITAL_COMMUNITY): Payer: Self-pay | Admitting: Family Medicine

## 2021-02-18 DIAGNOSIS — N289 Disorder of kidney and ureter, unspecified: Secondary | ICD-10-CM | POA: Diagnosis not present

## 2021-02-18 DIAGNOSIS — E860 Dehydration: Secondary | ICD-10-CM | POA: Diagnosis not present

## 2021-02-18 DIAGNOSIS — F0391 Unspecified dementia with behavioral disturbance: Secondary | ICD-10-CM

## 2021-02-18 DIAGNOSIS — F0390 Unspecified dementia without behavioral disturbance: Secondary | ICD-10-CM

## 2021-02-18 DIAGNOSIS — D61818 Other pancytopenia: Secondary | ICD-10-CM | POA: Diagnosis not present

## 2021-02-18 LAB — AMMONIA: Ammonia: 69 umol/L — ABNORMAL HIGH (ref 9–35)

## 2021-02-18 LAB — CBC
HCT: 26.1 % — ABNORMAL LOW (ref 39.0–52.0)
Hemoglobin: 8.2 g/dL — ABNORMAL LOW (ref 13.0–17.0)
MCH: 32.9 pg (ref 26.0–34.0)
MCHC: 31.4 g/dL (ref 30.0–36.0)
MCV: 104.8 fL — ABNORMAL HIGH (ref 80.0–100.0)
Platelets: 60 10*3/uL — ABNORMAL LOW (ref 150–400)
RBC: 2.49 MIL/uL — ABNORMAL LOW (ref 4.22–5.81)
RDW: 15.3 % (ref 11.5–15.5)
WBC: 2.4 10*3/uL — ABNORMAL LOW (ref 4.0–10.5)
nRBC: 0 % (ref 0.0–0.2)

## 2021-02-18 LAB — BASIC METABOLIC PANEL
Anion gap: 5 (ref 5–15)
BUN: 15 mg/dL (ref 8–23)
CO2: 20 mmol/L — ABNORMAL LOW (ref 22–32)
Calcium: 8.5 mg/dL — ABNORMAL LOW (ref 8.9–10.3)
Chloride: 118 mmol/L — ABNORMAL HIGH (ref 98–111)
Creatinine, Ser: 1.38 mg/dL — ABNORMAL HIGH (ref 0.61–1.24)
GFR, Estimated: 53 mL/min — ABNORMAL LOW (ref >60)
Glucose, Bld: 169 mg/dL — ABNORMAL HIGH (ref 70–99)
Potassium: 4 mmol/L (ref 3.5–5.1)
Sodium: 143 mmol/L (ref 135–145)

## 2021-02-18 LAB — GLUCOSE, CAPILLARY: Glucose-Capillary: 182 mg/dL — ABNORMAL HIGH (ref 70–99)

## 2021-02-18 LAB — CBG MONITORING, ED: Glucose-Capillary: 90 mg/dL (ref 70–99)

## 2021-02-18 MED ORDER — ONDANSETRON HCL 4 MG PO TABS: 4.0000 mg | ORAL_TABLET | Freq: Four times a day (QID) | ORAL | Status: DC | PRN

## 2021-02-18 MED ORDER — LACTULOSE 10 GM/15ML PO SOLN
20.0000 g | Freq: Three times a day (TID) | ORAL | Status: DC
  Administered 2021-02-18 – 2021-02-19 (×3): 20 g via ORAL
  Filled 2021-02-18 (×4): qty 30

## 2021-02-18 MED ORDER — DONEPEZIL HCL 10 MG PO TABS
10.0000 mg | ORAL_TABLET | Freq: Every day | ORAL | Status: DC
  Administered 2021-02-18 – 2021-03-08 (×19): 10 mg via ORAL
  Filled 2021-02-18 (×21): qty 1

## 2021-02-18 MED ORDER — SODIUM CHLORIDE 0.9 % IV SOLN: INTRAVENOUS | Status: AC

## 2021-02-18 MED ORDER — FERROUS SULFATE 325 (65 FE) MG PO TABS
325.0000 mg | ORAL_TABLET | Freq: Three times a day (TID) | ORAL | Status: DC
  Administered 2021-02-19 – 2021-03-16 (×67): 325 mg via ORAL
  Filled 2021-02-18 (×70): qty 1

## 2021-02-18 MED ORDER — SENNOSIDES-DOCUSATE SODIUM 8.6-50 MG PO TABS: 1.0000 | ORAL_TABLET | Freq: Every evening | ORAL | Status: DC | PRN

## 2021-02-18 MED ORDER — SODIUM CHLORIDE 0.9% IV SOLUTION: Freq: Once | INTRAVENOUS | Status: DC

## 2021-02-18 MED ORDER — ACETAMINOPHEN 325 MG PO TABS: 650.0000 mg | ORAL_TABLET | Freq: Four times a day (QID) | ORAL | Status: DC | PRN

## 2021-02-18 MED ORDER — QUETIAPINE FUMARATE 25 MG PO TABS
12.5000 mg | ORAL_TABLET | Freq: Every day | ORAL | Status: DC
  Administered 2021-02-18: 12.5 mg via ORAL
  Filled 2021-02-18 (×2): qty 1

## 2021-02-18 MED ORDER — PRAVASTATIN SODIUM 10 MG PO TABS
20.0000 mg | ORAL_TABLET | Freq: Every day | ORAL | Status: DC
  Administered 2021-02-18 – 2021-03-15 (×25): 20 mg via ORAL
  Filled 2021-02-18 (×26): qty 2

## 2021-02-18 MED ORDER — INSULIN ASPART 100 UNIT/ML IJ SOLN
0.0000 [IU] | Freq: Three times a day (TID) | INTRAMUSCULAR | Status: DC
  Administered 2021-02-19: 2 [IU] via SUBCUTANEOUS
  Administered 2021-02-19 – 2021-03-04 (×12): 1 [IU] via SUBCUTANEOUS
  Administered 2021-03-07: 2 [IU] via SUBCUTANEOUS
  Administered 2021-03-07 – 2021-03-12 (×7): 1 [IU] via SUBCUTANEOUS
  Administered 2021-03-12 (×2): 2 [IU] via SUBCUTANEOUS
  Administered 2021-03-13 – 2021-03-14 (×3): 1 [IU] via SUBCUTANEOUS
  Administered 2021-03-15: 2 [IU] via SUBCUTANEOUS
  Administered 2021-03-16 (×2): 1 [IU] via SUBCUTANEOUS

## 2021-02-18 MED ORDER — MEMANTINE HCL 10 MG PO TABS
5.0000 mg | ORAL_TABLET | Freq: Two times a day (BID) | ORAL | Status: DC
  Administered 2021-02-18 – 2021-03-16 (×51): 5 mg via ORAL
  Filled 2021-02-18 (×54): qty 1

## 2021-02-18 MED ORDER — ACETAMINOPHEN 650 MG RE SUPP: 650.0000 mg | Freq: Four times a day (QID) | RECTAL | Status: DC | PRN

## 2021-02-18 MED ORDER — ONDANSETRON HCL 4 MG/2ML IJ SOLN: 4.0000 mg | Freq: Four times a day (QID) | INTRAMUSCULAR | Status: DC | PRN

## 2021-02-18 NOTE — ED Notes (Signed)
Tele transport is in

## 2021-02-18 NOTE — Progress Notes (Signed)
20:00- Patient arrived from ED into 5W04. Ambulated from stretcher to bed.   Alert to self and place.  VSS. On RA. Sitter at bedside. Bed locked and in lowest position, non skid foot wear in place, call light and personal belongings within reach.

## 2021-02-18 NOTE — ED Notes (Signed)
Pt back in bed, admitting at bedside

## 2021-02-18 NOTE — ED Notes (Signed)
Urine culture sent down with urine specimen  

## 2021-02-18 NOTE — ED Notes (Signed)
Dr. Opyd at bedside  

## 2021-02-18 NOTE — Progress Notes (Signed)
2145: Dr. Myna Hidalgo paged concerning the order for t to be re-swabbed for COVID  2153- Received call back from Dr. Myna Hidalgo, orders to cancel COVID test

## 2021-02-18 NOTE — H&P (Signed)
History and Physical    [name] ION:[id number] DOB: 12-22-1945 DOA: 02/17/2021  PCP: Wendie Agreste, MD   Patient coming from: Home   Chief Complaint: Wandering outside, refusing to go back inside   HPI: [name] is a 75 y.o. male with medical history significant for hepatitis B, cirrhosis, hepatocellular carcinoma, pancytopenia, type 2 diabetes mellitus, sleep apnea with CPAP intolerance, and dementia, now presenting from home where he had been wandering outside all day and refusing to go back inside.  Patient is unable to contribute to the history due to his dementia and disorientation.  Heart rate was reportedly in the 130s with EMS initially and normalized with IV fluids prior to arrival in the ED.  Family reported that the patient has been wandering more recently, occasionally combative, and family has been working with Education officer, museum with plan for likely nursing facility placement.  ED Course: Upon arrival to the ED, patient is found to be afebrile, saturating well on room air, with normal heart rate and blood pressure.  Chemistry panel notable for creatinine 1.38, up from 1.26 last month and previously closer to 1.  CBC with chronic pancytopenia, hemoglobin 8.6 down from 9.5 last month.  Urinalysis notable for ketonuria. Hospitalists asked to admit.   Review of Systems:  Unable to complete ROS secondary the patient's clinical condition.  Past Medical History:  Diagnosis Date   Cancer ([place])    hepatocellular cancer    Diabetes mellitus without complication ([place])    Dyspnea    Hepatitis B    Hyperlipidemia    Hypertension    Irregular heart beat 01/28/2018   Narcolepsy    per office visit note of 08/2011    Sleep apnea    cpap    Past Surgical History:  Procedure Laterality Date   CHOLECYSTECTOMY N/A 01/28/2018   Procedure: LAPAROSCOPIC CHOLECYSTECTOMY;  Surgeon: [name], MD;  Location: [place];  Service: General;  Laterality: N/A;   COLONOSCOPY WITH  PROPOFOL N/A 08/27/2017   Procedure: COLONOSCOPY WITH PROPOFOL;  Surgeon: Clarene Essex, MD;  [facility];  Service: Endoscopy;  Laterality: N/A;   ESOPHAGOGASTRODUODENOSCOPY (EGD) WITH PROPOFOL N/A 02/23/2020   Procedure: ESOPHAGOGASTRODUODENOSCOPY (EGD) WITH PROPOFOL;  Surgeon: Clarene Essex, MD;  [facility];  Service: Endoscopy;  Laterality: N/A;   HOT HEMOSTASIS N/A 08/27/2017   Procedure: HOT HEMOSTASIS (ARGON PLASMA COAGULATION/BICAP);  Surgeon: Clarene Essex, MD;  Location[facility];  Service: Endoscopy;  Laterality: N/A;   LAPAROSCOPIC CHOLECYSTECTOMY  01/28/2018   LAPAROSCOPY  03/02/2013   Procedure: LAPAROSCOPY DIAGNOSTIC;  Surgeon: [name], MD;  [facility];  Service: General;;   LIVER ULTRASOUND  03/02/2013   Procedure: LIVER ULTRASOUND;  Surgeon: [name], MD;  [facility];  Service: General;;   OPEN PARTIAL HEPATECTOMY   03/02/2013   Procedure: OPEN PARTIAL HEPATECTOMY [83];  Surgeon: [name], MD;  [facility];  Service: General;;  DX LAPAROSCOPY, INTRAOPERATIVE LIVER ULTRASOUND, OPEN PARTIAL HEPATECTOMY   pinched nerve in back      Social History:   reports that he has quit smoking. His smoking use included cigarettes. He has never used smokeless tobacco. He reports that he does not drink alcohol and does not use drugs.  Allergies  Allergen Reactions   Ace Inhibitors Swelling and Other (See Comments)    Angioedema - face.     Family History  Problem Relation Age of Onset   Dementia Mother    Cancer Father  Diabetes Brother    Hypertension Brother    Cancer Brother      Prior to Admission medications   Medication Sig Start Date End Date Taking? Authorizing Provider  donepezil (ARICEPT) 10 MG tablet Take 1 tablet (10 mg total) by mouth at bedtime. 10/20/20   Dohmeier, Asencion Partridge, MD  ferrous sulfate 325 (65 FE) MG tablet Take 325 mg by mouth 3 (three) times daily with meals.    [provider]  memantine (NAMENDA) 5 MG  tablet Take 1 tablet (5 mg total) by mouth 2 (two) times daily. 10/20/20   Dohmeier, Asencion Partridge, MD  metFORMIN (GLUCOPHAGE) 500 MG tablet TAKE 1 TABLET BY MOUTH TWICE DAILY WITH A MEAL 01/09/21   Wendie Agreste, MD  Multiple Vitamin (MULTIVITAMIN WITH MINERALS) TABS tablet Take 1 tablet by mouth daily.    [provider]  pravastatin (PRAVACHOL) 20 MG tablet Take 1 tablet (20 mg total) by mouth at bedtime. 01/08/20   Wendie Agreste, MD  psyllium (KLS NATURAL PSYLLIUM FIBER) 58.6 % powder Take 1 packet by mouth daily.    [provider]  QUEtiapine (SEROQUEL) 25 MG tablet Take 0.5 tablets (12.5 mg total) by mouth at bedtime. 10/20/20   Dohmeier, Asencion Partridge, MD    Physical Exam: Vitals:   02/17/21 2045 02/17/21 2130 02/17/21 2245 02/18/21 0000  BP: (!) 144/73 (!) 147/70 (!) 144/70 (!) 156/99  Pulse: 84 82 81 84  Resp: 18 16 16 18   Temp:      TempSrc:      SpO2: 100% 100% 100% 100%    Constitutional: NAD, calm  Eyes: PERTLA, lids and conjunctivae normal ENMT: Mucous membranes are moist. Posterior pharynx clear of any exudate or lesions.   Neck: supple, no masses  Respiratory: no wheezing, no crackles. No accessory muscle use.  Cardiovascular: S1 & S2 heard, regular rate and rhythm. No extremity edema.   Abdomen: No distension, no tenderness, soft. Bowel sounds active.  Musculoskeletal: no clubbing / cyanosis. No joint deformity upper and lower extremities.   Skin: no significant rashes, lesions, ulcers. Warm, dry, well-perfused. Neurologic: CN 2-12 grossly intact. Sensation intact. Moving all extremities.  Psychiatric: Alert and oriented to person only. Pleasant and cooperative.    Labs and Imaging on Admission: I have personally reviewed following labs and imaging studies  CBC: Recent Labs  Lab 02/17/21 1843  WBC 3.0*  NEUTROABS 2.2  HGB 8.6*  HCT 27.4*  MCV 104.6*  PLT 62*   Basic Metabolic Panel: Recent Labs  Lab 02/17/21 1843  NA 144  K 4.3  CL 118*  CO2  18*  GLUCOSE 89  BUN 17  CREATININE 1.38*  CALCIUM 8.5*   GFR: CrCl cannot be calculated (Unknown ideal weight.). Liver Function Tests: No results for input(s): AST, ALT, ALKPHOS, BILITOT, PROT, ALBUMIN in the last 168 hours. No results for input(s): LIPASE, AMYLASE in the last 168 hours. No results for input(s): AMMONIA in the last 168 hours. Coagulation Profile: No results for input(s): INR, PROTIME in the last 168 hours. Cardiac Enzymes: No results for input(s): CKTOTAL, CKMB, CKMBINDEX, TROPONINI in the last 168 hours. BNP (last 3 results) No results for input(s): PROBNP in the last 8760 hours. HbA1C: No results for input(s): HGBA1C in the last 72 hours. CBG: No results for input(s): GLUCAP in the last 168 hours. Lipid Profile: No results for input(s): CHOL, HDL, LDLCALC, TRIG, CHOLHDL, LDLDIRECT in the last 72 hours. Thyroid Function Tests: No results for input(s): TSH, T4TOTAL, FREET4, T3FREE,  THYROIDAB in the last 72 hours. Anemia Panel: No results for input(s): VITAMINB12, FOLATE, FERRITIN, TIBC, IRON, RETICCTPCT in the last 72 hours. Urine analysis:    Component Value Date/Time   COLORURINE AMBER (A) 02/17/2021 2052   APPEARANCEUR CLEAR 02/17/2021 2052   LABSPEC 1.020 02/17/2021 2052   PHURINE 5.0 02/17/2021 2052   GLUCOSEU NEGATIVE 02/17/2021 2052   HGBUR NEGATIVE 02/17/2021 2052   BILIRUBINUR NEGATIVE 02/17/2021 2052   BILIRUBINUR neg 01/15/2013 1733   KETONESUR 5 (A) 02/17/2021 2052   PROTEINUR NEGATIVE 02/17/2021 2052   UROBILINOGEN 2.0 01/15/2013 1733   NITRITE NEGATIVE 02/17/2021 2052   LEUKOCYTESUR NEGATIVE 02/17/2021 2052   Sepsis Labs: @LABRCNTIP (procalcitonin:4,lacticidven:4) )No results found for this or any previous visit (from the past 240 hour(s)).   Radiological Exams on Admission: No results found.  Assessment/Plan   1. Dehydration; renal insufficiency  - Patient with dementia and increased wandering recently presents from home where he  had been outside all day and refusing to come in  - HR was reportedly 130s initially with EMS and normalized with IVF prior to arrival  - SCr is 1.38 on admission, up from 1.26 on 02/07/21 and previously closer to 1.0  - Continue gentle IVF hydration, renally dose medications, repeat chem panel in am    2. Pancytopenia  - WBC and platelets appear stable, Hgb 8.6 from 9.5 last month  - Monitor, transfuse if needed, continue hematology follow-up after discharge as already planned   3. Type II DM  - A1c was 7.3% in January 2022  - Check CBGs and use a low-intensity SSI if needed    4. Dementia  - Has reportedly been wandering more and combative yesterday, has been calm and cooperative in ED   - Continue supportive care, delirium precautions    5. Cirrhosis  - Appears compensated, check ammonia given report of worsening cognition     DVT prophylaxis: SCDs  Code Status: Full for now, patient disoriented and family could not be reached  Level of Care: Level of care: Med-Surg Family Communication: Unable to reach son at listed phone number   Disposition Plan:  Patient is from: Home  Anticipated d/c is to: TBD Anticipated d/c date is: 02/19/21 Patient currently: pending IVF hydration, repeat blood work   Consults called: none  Admission status: observation     Vianne Bulls, MD [facility]  02/18/2021, 1:50 AM

## 2021-02-18 NOTE — ED Notes (Signed)
Pt given turkey sandwich and soda. 

## 2021-02-18 NOTE — ED Notes (Signed)
Pt sitting on side of bed eating breakfast tray, in front of nursing station.

## 2021-02-18 NOTE — ED Notes (Signed)
Pt tried to get out of the bed. This tech redirected pt back to bed, and gave him a new gown since he soaked the one he was wearing. Pt is in bed at this time and educated on remaining in bed, calling with call light, and the fall risks without assistance from staff. [place], RN notified. Call bell is within reach and pt is resting comfortably.

## 2021-02-18 NOTE — ED Notes (Signed)
[employer] at bedside to serve pt w/ IVC papers. Admitting aware.

## 2021-02-18 NOTE — ED Notes (Signed)
Pt ambulatory to restroom and back.

## 2021-02-18 NOTE — ED Notes (Addendum)
Belongings inventoried and kept w/ nursing staff. 2 bags total, and cane

## 2021-02-18 NOTE — Progress Notes (Signed)
Brief same-day note:  Patient is a 75 year old male with history of hepatitis B, cirrhosis, hepatocellular carcinoma, pancytopenia, diabetes, sleep apnea with CPAP intolerance, dementia who presents from home after he was found to be wandering outside and refusing to go back to home.  Patient was unable to contribute any history due to his dementia, disorientation.  On presentation he was tachycardic in the range of 130s, normalized with IV fluids.  As per the family, he he has been recently combative, wandering here and there and they have been working with a Education officer, museum with plan for nursing facility placement.  On presentation, lab work showed chronic pancytopenia, hemoglobin A1c of 8.6.  Patient was admitted for further management and unsafe discharge plan. Patient has been started on IV fluids for mild AKI.  Lab work showed pancytopenia, we have ordered vitamin C13 and folic acid level.  Hemoglobin is currently stable.  His ammonia level is found to be high which could be associated with his history of cirrhosis, hepatitis B/HCC.  We have ordered lactulose.  He has been started on sliding scale insulin for his diabetes.  We have restarted his home medications. Patient seen and examined at the bedside this morning.  He was confused but not agitated.  He denied any complaints.  He was hemodynamically stable. As per the RN, he has been IVCed by his family.  We will request social worker evaluation, PT/OT consultation for plan for skilled nursing facility placement. Reviewed oncology note.  He follows with Dr. Learta Codding for his history of stage I hepatocellular carcinoma.  He is status post partial left hepatectomy on 02/2013.  He receives IV iron. I called and discussed with the son on phone today.

## 2021-02-18 NOTE — ED Notes (Signed)
Report attempted 

## 2021-02-18 NOTE — ED Notes (Signed)
Pt recently dx with covid (6/21) - out of isolation period but not testing here - Confirmed with Dr. Myna Hidalgo

## 2021-02-19 DIAGNOSIS — F0390 Unspecified dementia without behavioral disturbance: Secondary | ICD-10-CM | POA: Diagnosis not present

## 2021-02-19 DIAGNOSIS — E860 Dehydration: Secondary | ICD-10-CM | POA: Diagnosis not present

## 2021-02-19 DIAGNOSIS — F419 Anxiety disorder, unspecified: Secondary | ICD-10-CM | POA: Diagnosis not present

## 2021-02-19 LAB — CBC WITH DIFFERENTIAL/PLATELET
Abs Immature Granulocytes: 0.01 10*3/uL (ref 0.00–0.07)
Basophils Absolute: 0 10*3/uL (ref 0.0–0.1)
Basophils Relative: 1 %
Eosinophils Absolute: 0.1 10*3/uL (ref 0.0–0.5)
Eosinophils Relative: 3 %
HCT: 25.1 % — ABNORMAL LOW (ref 39.0–52.0)
Hemoglobin: 7.9 g/dL — ABNORMAL LOW (ref 13.0–17.0)
Immature Granulocytes: 1 %
Lymphocytes Relative: 30 %
Lymphs Abs: 0.6 10*3/uL — ABNORMAL LOW (ref 0.7–4.0)
MCH: 32.6 pg (ref 26.0–34.0)
MCHC: 31.5 g/dL (ref 30.0–36.0)
MCV: 103.7 fL — ABNORMAL HIGH (ref 80.0–100.0)
Monocytes Absolute: 0.2 10*3/uL (ref 0.1–1.0)
Monocytes Relative: 11 %
Neutro Abs: 1 10*3/uL — ABNORMAL LOW (ref 1.7–7.7)
Neutrophils Relative %: 54 %
Platelets: 73 10*3/uL — ABNORMAL LOW (ref 150–400)
RBC: 2.42 MIL/uL — ABNORMAL LOW (ref 4.22–5.81)
RDW: 14.8 % (ref 11.5–15.5)
WBC: 1.9 10*3/uL — ABNORMAL LOW (ref 4.0–10.5)
nRBC: 0 % (ref 0.0–0.2)

## 2021-02-19 LAB — BASIC METABOLIC PANEL
Anion gap: 8 (ref 5–15)
BUN: 12 mg/dL (ref 8–23)
CO2: 19 mmol/L — ABNORMAL LOW (ref 22–32)
Calcium: 8 mg/dL — ABNORMAL LOW (ref 8.9–10.3)
Chloride: 114 mmol/L — ABNORMAL HIGH (ref 98–111)
Creatinine, Ser: 1.02 mg/dL (ref 0.61–1.24)
GFR, Estimated: >60 mL/min (ref >60)
Glucose, Bld: 116 mg/dL — ABNORMAL HIGH (ref 70–99)
Potassium: 3.5 mmol/L (ref 3.5–5.1)
Sodium: 141 mmol/L (ref 135–145)

## 2021-02-19 LAB — GLUCOSE, CAPILLARY
Glucose-Capillary: 129 mg/dL — ABNORMAL HIGH (ref 70–99)
Glucose-Capillary: 205 mg/dL — ABNORMAL HIGH (ref 70–99)
Glucose-Capillary: 161 mg/dL — ABNORMAL HIGH (ref 70–99)
Glucose-Capillary: 120 mg/dL — ABNORMAL HIGH (ref 70–99)

## 2021-02-19 LAB — VITAMIN B12: Vitamin B-12: 430 pg/mL (ref 180–914)

## 2021-02-19 LAB — FOLATE: Folate: 14.9 ng/mL (ref >5.9)

## 2021-02-19 LAB — AMMONIA: Ammonia: 50 umol/L — ABNORMAL HIGH (ref 9–35)

## 2021-02-19 MED ORDER — HYDROXYZINE HCL 25 MG PO TABS
25.0000 mg | ORAL_TABLET | Freq: Once | ORAL | Status: AC
  Administered 2021-02-19: 25 mg via ORAL
  Filled 2021-02-19: qty 1

## 2021-02-19 MED ORDER — QUETIAPINE FUMARATE 50 MG PO TABS
50.0000 mg | ORAL_TABLET | Freq: Every day | ORAL | Status: DC
  Administered 2021-02-19 – 2021-03-15 (×24): 50 mg via ORAL
  Filled 2021-02-19 (×25): qty 1

## 2021-02-19 MED ORDER — CITALOPRAM HYDROBROMIDE 20 MG PO TABS
10.0000 mg | ORAL_TABLET | Freq: Every day | ORAL | Status: DC
  Administered 2021-02-19 – 2021-03-16 (×25): 10 mg via ORAL
  Filled 2021-02-19 (×26): qty 1

## 2021-02-19 NOTE — Progress Notes (Signed)
PROGRESS NOTE    [name]  VZD:[id number] DOB: 02/19/1946 DOA: 02/17/2021 PCP: Wendie Agreste, MD   Chief Complain:Wandering around,dehydration  Brief Narrative: Patient is a 75 year old male with history of hepatitis B, cirrhosis, hepatocellular carcinoma, pancytopenia, diabetes, sleep apnea with CPAP intolerance, dementia who presents from home after he was found to be wandering outside and refusing to go back to home.  Patient was unable to contribute any history due to his dementia, disorientation.  On presentation he was tachycardic in the range of 130s, normalized with IV fluids.  As per the family, he he has been recently combative, wandering here and there and they have been working with a Education officer, museum with plan for nursing facility placement.  On presentation, lab work showed chronic pancytopenia, hemoglobin A1c of 8.6.  Patient was admitted for further management and unsafe discharge plan. Patient was started on IV fluids for mild AKI.  Lab work showed pancytopenia, we have ordered vitamin I95 and folic acid level.  Hemoglobin is currently stable.  His ammonia level was found to be high which could be associated with his history of cirrhosis, hepatitis B/HCC,started on lactulose.  He was IVCed by his son.  We have placed PT/OT evaluation and TOC consult for plan for discussing facility placement.  Psychiatry also consulted regarding his mental status.  Assessment & Plan:   Principal Problem:   Dehydration Active Problems:   OSA (obstructive sleep apnea)   Cancer, hepatocellular (HCC)   Memory impairment of gradual onset   Pancytopenia (HCC)   Psychosis (HCC)   Renal insufficiency  Dehydration/AKI: Significantly improved with IV fluids.Fluids D/Ced  Dementia with behavioral disturbance: Patient was found to be wandering outside the home.  Family were trying to place him in a skilled nursing facility.  Patient noncompliant with medications.  Family brought him to the  emergency department and IVCed. Currently patient is not agitated.  PT/OT consulted.  Social worker consulted for possible consideration of SNF placement We have also requested psychiatry evaluation.  He is on Seroquel.  Confusion/encephalopathy: Secondary to combination of dementia /behavioral disturbance and also elevated ammonia level.  Monitor mental status.  Continue lactulose  History of hepatitis B/cirrhosis/hepatocellular carcinoma: Follows with Dr. Learta Codding.  Has history of stage I hepatocellular carcinoma.  He is status post partial left hepatectomy on 02/2013.   Pancytopenia: Secondary to liver cirrhosis/hepatocellular carcinoma.  Vitamin B12 and folate level normal.  Type 2 diabetes mellitus: Recent hemoglobin A1c of 7.3 as per 08/2020.  Continue sliding scale insulin.  Monitor blood sugars         DVT prophylaxis: SCD Code Status: Full code Family Communication: Discussed with son on phone on 02/18/2021 Status is: Observation  The patient remains OBS appropriate and will d/c before 2 midnights.  Dispo: The patient is from: Home              Anticipated d/c is to: SNF              Patient currently is medically stable to d/c.   Difficult to place patient No     Consultants: None  Procedures:None  Antimicrobials:  Anti-infectives (From admission, onward)    None       Subjective:  Patient is seen and examined the bedside this morning.  He was wandering and walking inside the room.  Sitter at the bedside.  He asked me why he is here.  Not severely agitated, communicates but not oriented.   Objective: Vitals:   02/18/21  2000 02/18/21 2330 02/19/21 0355 02/19/21 0732  BP: 128/60 (!) 116/59 (!) 117/59 (!) 115/56  Pulse: 63 65 67 67  Resp: 18 18 18 17   Temp: 98.3 F (36.8 C) 98 F (36.7 C) 97.7 F (36.5 C) 97.8 F (36.6 C)  TempSrc: Oral Oral Oral Oral  SpO2: 99% 100% 97% 100%  Weight: 80.6 kg       Intake/Output Summary (Last 24 hours) at 02/19/2021  0813 Last data filed at 02/19/2021 0000 Gross per 24 hour  Intake 1240 ml  Output 800 ml  Net 440 ml   Filed Weights   02/18/21 2000  Weight: 80.6 kg    Examination:  General exam: Overall comfortable, not in distress HEENT: PERRL Respiratory system:  no wheezes or crackles  Cardiovascular system: S1 & S2 heard, RRR.  Gastrointestinal system: Abdomen is nondistended, soft and nontender. Central nervous system: Alert and awake but not oriented Extremities: No edema, no clubbing ,no cyanosis Skin: No rashes, no ulcers,no icterus     Data Reviewed: I have personally reviewed following labs and imaging studies  CBC: Recent Labs  Lab 02/17/21 1843 02/18/21 0319 02/19/21 0140  WBC 3.0* 2.4* 1.9*  NEUTROABS 2.2  --  1.0*  HGB 8.6* 8.2* 7.9*  HCT 27.4* 26.1* 25.1*  MCV 104.6* 104.8* 103.7*  PLT 62* 60* 73*   Basic Metabolic Panel: Recent Labs  Lab 02/17/21 1843 02/18/21 0319 02/19/21 0140  NA 144 143 141  K 4.3 4.0 3.5  CL 118* 118* 114*  CO2 18* 20* 19*  GLUCOSE 89 169* 116*  BUN 17 15 12   CREATININE 1.38* 1.38* 1.02  CALCIUM 8.5* 8.5* 8.0*   GFR: Estimated Creatinine Clearance: 61.6 mL/min (by C-G formula based on SCr of 1.02 mg/dL). Liver Function Tests: No results for input(s): AST, ALT, ALKPHOS, BILITOT, PROT, ALBUMIN in the last 168 hours. No results for input(s): LIPASE, AMYLASE in the last 168 hours. Recent Labs  Lab 02/18/21 0319 02/19/21 0140  AMMONIA 69* 50*   Coagulation Profile: No results for input(s): INR, PROTIME in the last 168 hours. Cardiac Enzymes: No results for input(s): CKTOTAL, CKMB, CKMBINDEX, TROPONINI in the last 168 hours. BNP (last 3 results) No results for input(s): PROBNP in the last 8760 hours. HbA1C: No results for input(s): HGBA1C in the last 72 hours. CBG: Recent Labs  Lab 02/18/21 0810 02/18/21 2007 02/19/21 0745  GLUCAP 90 182* 129*   Lipid Profile: No results for input(s): CHOL, HDL, LDLCALC, TRIG, CHOLHDL,  LDLDIRECT in the last 72 hours. Thyroid Function Tests: No results for input(s): TSH, T4TOTAL, FREET4, T3FREE, THYROIDAB in the last 72 hours. Anemia Panel: Recent Labs    02/19/21 0140  VITAMINB12 430  FOLATE 14.9   Sepsis Labs: No results for input(s): PROCALCITON, LATICACIDVEN in the last 168 hours.  No results found for this or any previous visit (from the past 240 hour(s)).       Radiology Studies: No results found.      Scheduled Meds:  sodium chloride   Intravenous Once   donepezil  10 mg Oral QHS   ferrous sulfate  325 mg Oral TID WC   hydrOXYzine  25 mg Oral Once   insulin aspart  0-6 Units Subcutaneous TID WC   lactulose  20 g Oral TID   memantine  5 mg Oral BID   pravastatin  20 mg Oral QHS   QUEtiapine  12.5 mg Oral QHS   Continuous Infusions:   LOS: 0 days  Time spent: 25 mins.More than 50% of that time was spent in counseling and/or coordination of care.      Shelly Coss, MD [facility] P7/10/2020, 8:13 AM

## 2021-02-19 NOTE — Consult Note (Signed)
[name]   Reason for Consult: ''Family brought him to hospital because he does not follow any command,wanders around, does not sit on place.I was wondering if psychiatry can help regarding his mental state.he is not in delirium.'' Referring Physician:  Shelly Coss, MD Patient Identification: [name] MRN:  [id number] Principal Diagnosis: Dementia without behavioral disturbance ([place]) Diagnosis:  Principal Problem:   Dementia without behavioral disturbance ([place]) Active Problems:   OSA (obstructive sleep apnea)   Cancer, hepatocellular (HCC)   Memory impairment of gradual onset   Pancytopenia ([place])   Psychosis ([place])   Dehydration   Renal insufficiency   Total Time spent with patient: 45 minutes  Subjective:   [name] is a 75 y.o. male patient admitted with dehydration.  HPI:  75 year old male with history of Dementia, hepatitis B, cirrhosis, hepatocellular carcinoma, pancytopenia, diabetes, sleep apnea with CPAP intolerance was admitted due to dehydration. Patient is unable to make a major contribution to psychiatric assessment due to hearing loss and Dementia. He is alert, awake but only oriented to person but not to time, place and situation. Per chart review, he was brought to the hospital after he was found to be wandering outside and refusing to go back to home. Today, patient is calm, cooperative, reports being anxious but he denies psychosis, delusions and he is neither aggressive nor agitated.   Past Psychiatric History: as above  Risk to Self:   Risk to Others:   Prior Inpatient Therapy:   Prior Outpatient Therapy:    Past Medical History:  Past Medical History:  Diagnosis Date   Cancer ([place])    hepatocellular cancer    Diabetes mellitus without complication ([place])    Dyspnea    Hepatitis B    Hyperlipidemia    Hypertension    Irregular heart beat 01/28/2018   Narcolepsy    per office visit note of 08/2011    Sleep  apnea    cpap    Past Surgical History:  Procedure Laterality Date   CHOLECYSTECTOMY N/A 01/28/2018   Procedure: LAPAROSCOPIC CHOLECYSTECTOMY;  Surgeon: [name], MD;  Location: [facility];  Service: General;  Laterality: N/A;   COLONOSCOPY WITH PROPOFOL N/A 08/27/2017   Procedure: COLONOSCOPY WITH PROPOFOL;  Surgeon: Clarene Essex, MD;  Location: [facility];  Service: Endoscopy;  Laterality: N/A;   ESOPHAGOGASTRODUODENOSCOPY (EGD) WITH PROPOFOL N/A 02/23/2020   Procedure: ESOPHAGOGASTRODUODENOSCOPY (EGD) WITH PROPOFOL;  Surgeon: Clarene Essex, MD;  [facility];  Service: Endoscopy;  Laterality: N/A;   HOT HEMOSTASIS N/A 08/27/2017   Procedure: HOT HEMOSTASIS (ARGON PLASMA COAGULATION/BICAP);  Surgeon: Clarene Essex, MD;  Location: [facility];  Service: Endoscopy;  Laterality: N/A;   LAPAROSCOPIC CHOLECYSTECTOMY  01/28/2018   LAPAROSCOPY  03/02/2013   Procedure: LAPAROSCOPY DIAGNOSTIC;  Surgeon: [name], MD;  [facility];  Service: General;;   LIVER ULTRASOUND  03/02/2013   Procedure: LIVER ULTRASOUND;  Surgeon: [name], MD;  [facility];  Service: General;;   OPEN PARTIAL HEPATECTOMY   03/02/2013   Procedure: OPEN PARTIAL HEPATECTOMY [83];  Surgeon: [name], MD;  Location[facility];  Service: General;;  DX LAPAROSCOPY, INTRAOPERATIVE LIVER ULTRASOUND, OPEN PARTIAL HEPATECTOMY   pinched nerve in back     Family History:  Family History  Problem Relation Age of Onset   Dementia Mother    Cancer Father    Diabetes Brother    Hypertension Brother    Cancer Brother    Family Psychiatric  History:  Social History:  Social History   Substance and Sexual Activity  Alcohol Use No     Social History   Substance and Sexual Activity  Drug Use No    Social History   Socioeconomic History   Marital status: Divorced    Spouse name: Not on file   Number of children: 2   Years of education: 15+   Highest education level: Bachelor's degree (e.g., BA, AB, BS)   Occupational History   Occupation: Retired  Tobacco Use   Smoking status: Former    Years: 5.00    Pack years: 0.00    Types: Cigarettes   Smokeless tobacco: Never  Vaping Use   Vaping Use: Never used  Substance and Sexual Activity   Alcohol use: No   Drug use: No   Sexual activity: Not Currently  Other Topics Concern   Not on file  Social History Narrative   Patient is single and lives alone- became widower when 1 child was 34 year old, divorced 2nd marriage   Has #2 grown children, not in area or involved in his life   Patient is retired.   Patient has a college education.   Patient is right-handed.   Patient drinks maybe two sodas daily.   Social Determinants of Health   Financial Resource Strain: Low Risk    Difficulty of Paying Living Expenses: Not hard at all  Food Insecurity: Food Insecurity Present   Worried About Charity fundraiser in the Last Year: Sometimes true   Arboriculturist in the Last Year: Sometimes true  Transportation Needs: No Transportation Needs   Lack of Transportation (Medical): No   Lack of Transportation (Non-Medical): No  Physical Activity: Inactive   Days of Exercise per Week: 0 days   Minutes of Exercise per Session: 0 min  Stress: No Stress Concern Present   Feeling of Stress : Not at all  Social Connections: Moderately Isolated   Frequency of Communication with Friends and Family: More than three times a week   Frequency of Social Gatherings with Friends and Family: More than three times a week   Attends Religious Services: 1 to 4 times per year   Active Member of [employer] or Organizations: No   Attends Archivist Meetings: Never   Marital Status: Divorced   Additional Social History:    Allergies:   Allergies  Allergen Reactions   Ace Inhibitors Swelling and Other (See Comments)    Angioedema - face.     Labs:  Results for orders placed or performed during the hospital encounter of 02/17/21 (from the past 48 hour(s))   CBC with Differential     Status: Abnormal   Collection Time: 02/17/21  6:43 PM  Result Value Ref Range   WBC 3.0 (L) 4.0 - 10.5 K/uL   RBC 2.62 (L) 4.22 - 5.81 MIL/uL   Hemoglobin 8.6 (L) 13.0 - 17.0 g/dL   HCT 27.4 (L) 39.0 - 52.0 %   MCV 104.6 (H) 80.0 - 100.0 fL   MCH 32.8 26.0 - 34.0 pg   MCHC 31.4 30.0 - 36.0 g/dL   RDW 15.4 11.5 - 15.5 %   Platelets 62 (L) 150 - 400 K/uL    Comment: Immature Platelet Fraction may be clinically indicated, consider ordering this additional test [id number] REPEATED TO VERIFY PLATELET COUNT CONFIRMED BY SMEAR    nRBC 0.0 0.0 - 0.2 %   Neutrophils Relative % 70 %   Neutro Abs 2.2 1.7 -  7.7 K/uL   Lymphocytes Relative 19 %   Lymphs Abs 0.6 (L) 0.7 - 4.0 K/uL   Monocytes Relative 9 %   Monocytes Absolute 0.3 0.1 - 1.0 K/uL   Eosinophils Relative 1 %   Eosinophils Absolute 0.0 0.0 - 0.5 K/uL   Basophils Relative 1 %   Basophils Absolute 0.0 0.0 - 0.1 K/uL   Immature Granulocytes 0 %   Abs Immature Granulocytes 0.01 0.00 - 0.07 K/uL    Comment: Performed at [facility] [address], [place], [place] [address]  Basic metabolic panel     Status: Abnormal   Collection Time: 02/17/21  6:43 PM  Result Value Ref Range   Sodium 144 135 - 145 mmol/L   Potassium 4.3 3.5 - 5.1 mmol/L   Chloride 118 (H) 98 - 111 mmol/L   CO2 18 (L) 22 - 32 mmol/L   Glucose, Bld 89 70 - 99 mg/dL    Comment: Glucose reference range applies only to samples taken after fasting for at least 8 hours.   BUN 17 8 - 23 mg/dL   Creatinine, Ser 1.38 (H) 0.61 - 1.24 mg/dL   Calcium 8.5 (L) 8.9 - 10.3 mg/dL   GFR, Estimated 53 (L) >60 mL/min    Comment: (NOTE) Calculated using the CKD-EPI Creatinine Equation (2021)    Anion gap 8 5 - 15    Comment: Performed at [facility] [address], [place], [place] [address]  Urinalysis, Routine w reflex microscopic Urine, Clean Catch     Status: Abnormal   Collection Time: 02/17/21  8:52 PM  Result Value Ref Range    Color, Urine AMBER (A) YELLOW    Comment: BIOCHEMICALS MAY BE AFFECTED BY COLOR   APPearance CLEAR CLEAR   Specific Gravity, Urine 1.020 1.005 - 1.030   pH 5.0 5.0 - 8.0   Glucose, UA NEGATIVE NEGATIVE mg/dL   Hgb urine dipstick NEGATIVE NEGATIVE   Bilirubin Urine NEGATIVE NEGATIVE   Ketones, ur 5 (A) NEGATIVE mg/dL   Protein, ur NEGATIVE NEGATIVE mg/dL   Nitrite NEGATIVE NEGATIVE   Leukocytes,Ua NEGATIVE NEGATIVE    Comment: Performed at [facility] [address], [place], [place] [address]  Type and screen [place]     Status: None (Preliminary result)   Collection Time: 02/18/21  1:45 AM  Result Value Ref Range   ABO/RH(D) B POS    Antibody Screen POS    Sample Expiration 02/21/2021,2359    Antibody Identification ANTI M    Unit Number [id number]    Blood Component Type RED CELLS,LR    Unit division 00    Status of Unit ALLOCATED    Donor AG Type NEGATIVE FOR M ANTIGEN    Transfusion Status OK TO TRANSFUSE    Crossmatch Result COMPATIBLE   Basic metabolic panel     Status: Abnormal   Collection Time: 02/18/21  3:19 AM  Result Value Ref Range   Sodium 143 135 - 145 mmol/L   Potassium 4.0 3.5 - 5.1 mmol/L   Chloride 118 (H) 98 - 111 mmol/L   CO2 20 (L) 22 - 32 mmol/L   Glucose, Bld 169 (H) 70 - 99 mg/dL    Comment: Glucose reference range applies only to samples taken after fasting for at least 8 hours.   BUN 15 8 - 23 mg/dL   Creatinine, Ser 1.38 (H) 0.61 - 1.24 mg/dL   Calcium 8.5 (L) 8.9 - 10.3  mg/dL   GFR, Estimated 53 (L) >60 mL/min    Comment: (NOTE) Calculated using the CKD-EPI Creatinine Equation (2021)    Anion gap 5 5 - 15    Comment: Performed at [facility], [place] [address] [place], [place] [address]  CBC     Status: Abnormal   Collection Time: 02/18/21  3:19 AM  Result Value Ref Range   WBC 2.4 (L) 4.0 - 10.5 K/uL   RBC 2.49 (L) 4.22 - 5.81 MIL/uL   Hemoglobin 8.2 (L) 13.0 - 17.0 g/dL   HCT 26.1 (L) 39.0 - 52.0  %   MCV 104.8 (H) 80.0 - 100.0 fL   MCH 32.9 26.0 - 34.0 pg   MCHC 31.4 30.0 - 36.0 g/dL   RDW 15.3 11.5 - 15.5 %   Platelets 60 (L) 150 - 400 K/uL    Comment: Immature Platelet Fraction may be clinically indicated, consider ordering this additional test [id number] CONSISTENT WITH PREVIOUS RESULT REPEATED TO VERIFY    nRBC 0.0 0.0 - 0.2 %    Comment: Performed at [facility], [place] [address], [place], [place] [address]  Ammonia     Status: Abnormal   Collection Time: 02/18/21  3:19 AM  Result Value Ref Range   Ammonia 69 (H) 9 - 35 umol/L    Comment: Performed at [facility], [place] [address], [place], [place] [address]  CBG monitoring, ED     Status: None   Collection Time: 02/18/21  8:10 AM  Result Value Ref Range   Glucose-Capillary 90 70 - 99 mg/dL    Comment: Glucose reference range applies only to samples taken after fasting for at least 8 hours.  Glucose, capillary     Status: Abnormal   Collection Time: 02/18/21  8:07 PM  Result Value Ref Range   Glucose-Capillary 182 (H) 70 - 99 mg/dL    Comment: Glucose reference range applies only to samples taken after fasting for at least 8 hours.  Ammonia     Status: Abnormal   Collection Time: 02/19/21  1:40 AM  Result Value Ref Range   Ammonia 50 (H) 9 - 35 umol/L    Comment: Performed at [facility], [place] [address] [place], [place] [address]  CBC with Differential/Platelet     Status: Abnormal   Collection Time: 02/19/21  1:40 AM  Result Value Ref Range   WBC 1.9 (L) 4.0 - 10.5 K/uL   RBC 2.42 (L) 4.22 - 5.81 MIL/uL   Hemoglobin 7.9 (L) 13.0 - 17.0 g/dL   HCT 25.1 (L) 39.0 - 52.0 %   MCV 103.7 (H) 80.0 - 100.0 fL   MCH 32.6 26.0 - 34.0 pg   MCHC 31.5 30.0 - 36.0 g/dL   RDW 14.8 11.5 - 15.5 %   Platelets 73 (L) 150 - 400 K/uL    Comment: Immature Platelet Fraction may be clinically indicated, consider ordering this additional test [id number] CONSISTENT WITH PREVIOUS RESULT REPEATED TO VERIFY    nRBC  0.0 0.0 - 0.2 %   Neutrophils Relative % 54 %   Neutro Abs 1.0 (L) 1.7 - 7.7 K/uL   Lymphocytes Relative 30 %   Lymphs Abs 0.6 (L) 0.7 - 4.0 K/uL   Monocytes Relative 11 %   Monocytes Absolute 0.2 0.1 - 1.0 K/uL   Eosinophils Relative 3 %   Eosinophils Absolute 0.1 0.0 - 0.5 K/uL   Basophils Relative 1 %   Basophils Absolute 0.0  0.0 - 0.1 K/uL   Immature Granulocytes 1 %   Abs Immature Granulocytes 0.01 0.00 - 0.07 K/uL    Comment: Performed at [facility], [place] [address] [place], [place] [address]  Basic metabolic panel     Status: Abnormal   Collection Time: 02/19/21  1:40 AM  Result Value Ref Range   Sodium 141 135 - 145 mmol/L   Potassium 3.5 3.5 - 5.1 mmol/L   Chloride 114 (H) 98 - 111 mmol/L   CO2 19 (L) 22 - 32 mmol/L   Glucose, Bld 116 (H) 70 - 99 mg/dL    Comment: Glucose reference range applies only to samples taken after fasting for at least 8 hours.   BUN 12 8 - 23 mg/dL   Creatinine, Ser 1.02 0.61 - 1.24 mg/dL   Calcium 8.0 (L) 8.9 - 10.3 mg/dL   GFR, Estimated >60 >60 mL/min    Comment: (NOTE) Calculated using the CKD-EPI Creatinine Equation (2021)    Anion gap 8 5 - 15    Comment: Performed at [facility] [address] [place], [place] [address]  Folate     Status: None   Collection Time: 02/19/21  1:40 AM  Result Value Ref Range   Folate 14.9 >5.9 ng/mL    Comment: Performed at [facility] [address] [place], [place] [address]  Vitamin B12     Status: None   Collection Time: 02/19/21  1:40 AM  Result Value Ref Range   Vitamin B-12 430 180 - 914 pg/mL    Comment: (NOTE) This assay is not validated for testing neonatal or myeloproliferative syndrome specimens for Vitamin B12 levels. Performed at [facility], [place] [address], [place], [place] [address]   Glucose, capillary     Status: Abnormal   Collection Time: 02/19/21  7:45 AM  Result Value Ref Range   Glucose-Capillary 129 (H) 70 - 99 mg/dL    Comment: Glucose  reference range applies only to samples taken after fasting for at least 8 hours.    Current Facility-Administered Medications  Medication Dose Route Frequency Provider Last Rate Last Admin   0.9 %  sodium chloride infusion (Manually program via Guardrails IV Fluids)   Intravenous Once Opyd, Ilene Qua, MD       acetaminophen (TYLENOL) tablet 650 mg  650 mg Oral Q6H PRN Opyd, Ilene Qua, MD       Or   acetaminophen (TYLENOL) suppository 650 mg  650 mg Rectal Q6H PRN Opyd, Ilene Qua, MD       citalopram (CELEXA) tablet 10 mg  10 mg Oral Daily Shatarra Wehling, MD       donepezil (ARICEPT) tablet 10 mg  10 mg Oral QHS Adhikari, Amrit, MD   10 mg at 02/18/21 2128   ferrous sulfate tablet 325 mg  325 mg Oral TID WC Adhikari, Amrit, MD   325 mg at 02/19/21 0959   insulin aspart (novoLOG) injection 0-6 Units  0-6 Units Subcutaneous TID WC Opyd, Ilene Qua, MD       lactulose (CHRONULAC) 10 GM/15ML solution 20 g  20 g Oral TID Shelly Coss, MD   20 g at 02/19/21 1003   memantine (NAMENDA) tablet 5 mg  5 mg Oral BID Shelly Coss, MD   5 mg at 02/19/21 0958   ondansetron (ZOFRAN) tablet 4 mg  4 mg Oral Q6H PRN Opyd, Ilene Qua, MD       Or  ondansetron (ZOFRAN) injection 4 mg  4 mg Intravenous Q6H PRN Opyd, Ilene Qua, MD       pravastatin (PRAVACHOL) tablet 20 mg  20 mg Oral QHS Shelly Coss, MD   20 mg at 02/18/21 2128   QUEtiapine (SEROQUEL) tablet 50 mg  50 mg Oral QHS Adhikari, Amrit, MD       senna-docusate (Senokot-S) tablet 1 tablet  1 tablet Oral QHS PRN Opyd, Ilene Qua, MD        Musculoskeletal: Strength & Muscle Tone: within normal limits Gait & Station: normal Patient leans: N/A   Psychiatric Specialty Exam:  Presentation  General Appearance: appropriate Eye Contact:good Speech:intact Speech Volume:normal Handedness:right  Mood and Affect  Mood:anxious Affect:anxious  Thought Process  Thought Processes:unable to assess due to memory problem Descriptions of  Associations:unable to assess Orientation:only to person, not to time, place or situation Thought Content:unable to assess History of Schizophrenia/Schizoaffective disorder:No  Duration of Psychotic Symptoms:none Hallucinations:none Ideas of Reference:none Suicidal Thoughts:denies Homicidal Thoughts:denies  Sensorium  Memory:impaired Judgment:poor Insight:poor  Executive Functions  Concentration:fair Attention Span:fair [place] of Knowledge:poor Language:good  Psychomotor Activity  Psychomotor Activity:normal  Assets  Assets:family support  Sleep  Sleep:fair  Physical Exam: Physical Exam Review of Systems  Constitutional: Negative.   HENT:  Positive for hearing loss.   Eyes: Negative.   Respiratory: Negative.    Cardiovascular: Negative.   Gastrointestinal: Negative.   Skin: Negative.   Psychiatric/Behavioral:  The patient is nervous/anxious.   Blood pressure (!) 115/56, pulse 67, temperature 97.8 F (36.6 C), temperature source Oral, resp. rate 18, weight 80.6 kg, SpO2 100 %. Body mass index is 26.24 kg/m.  Treatment Plan Summary: 75 year old male with history of Dementia and multiple medical issues. Patient is calm, cooperative but reports being anxious. He is only oriented to person but not to time, place or situation. He does not have any behavior problem today. However, it clear that he probably has advance dementia whence he   Recommendations: -Avoid anti-cholinergic medications such as Hydroxyzine etc -Consider adding Citalopram 10 mg daily for anxiety -Consider social worker consult to educate patient's family to consider the benefits of placing patient in memory care unit -Educate family to install door locks, anti-wandering alarms and to provide safe space for wandering. -Consider Neurology consult for other recommendations.  Disposition: Patient does not meet criteria for psychiatric inpatient admission. Psychiatric service will follow as  needed  Corena Pilgrim, MD 02/19/2021 12:12 PM

## 2021-02-19 NOTE — Evaluation (Signed)
Occupational Therapy Evaluation Patient Details Name: COURTENAY CREGER MRN: [id number] DOB: 04-14-1946 Today's Date: 02/19/2021    History of Present Illness Pt is a 75 y.o. male admitted 7/1 for dehydration. He presented to the ED from home after wondering around outside all day and family unable to get him to come back inside. PMH significant for COVID+ 02/07/21, hepatitis B, cirrhosis, hepatocellular carcinoma, pancytopenia, type 2 diabetes mellitus, sleep apnea with CPAP intolerance, and dementia. Pt for IVC.   Clinical Impression   Pt PTA: Pt living with family, but unable to care for pt's memory and difficult behaviors. Pt currently, requires supervisionA for ADL tasks and would need assist for toileting and bathing LB at this time for consistency and properly cleaning. VSS on RA. Ambulatory in hallway 200' with no AD and minguardA. Pt would benefit from continued OT skilled services to continue to assess safety and ADL performance. OT following acutely.    Follow Up Recommendations  SNF;Supervision/Assistance - 24 hour    Equipment Recommendations  None recommended by OT    Recommendations for Other Services       Precautions / Restrictions Precautions Precautions: Fall;Other (comment) Precaution Comments: can be combative Restrictions Weight Bearing Restrictions: No      Mobility Bed Mobility Overal bed mobility: Needs Assistance Bed Mobility: Supine to Sit;Sit to Supine     Supine to sit: Supervision Sit to supine: Supervision   General bed mobility comments: supervision for safety    Transfers Overall transfer level: Needs assistance Equipment used: None Transfers: Sit to/from Stand;Stand Pivot Transfers Sit to Stand: Min guard Stand pivot transfers: Min guard       General transfer comment: min guard for safety, cues for sequencing/safety; easily distractible    Balance Overall balance assessment: Needs assistance Sitting-balance support: No upper  extremity supported;Feet supported Sitting balance-Leahy Scale: Good     Standing balance support: No upper extremity supported;During functional activity Standing balance-Leahy Scale: Fair                             ADL either performed or assessed with clinical judgement   ADL Overall ADL's : At baseline                                       General ADL Comments: Pt requires supervisionA for ADL tasks and would need assist for toileting and bathing LB at this time for consistency and properly cleaning.     Vision Baseline Vision/History: No visual deficits Patient Visual Report: No change from baseline Vision Assessment?: No apparent visual deficits     Perception     Praxis      Pertinent Vitals/Pain Pain Assessment: Faces Faces Pain Scale: No hurt Pain Intervention(s): Monitored during session     Hand Dominance Right   Extremity/Trunk Assessment Upper Extremity Assessment Upper Extremity Assessment: Overall WFL for tasks assessed   Lower Extremity Assessment Lower Extremity Assessment: Overall WFL for tasks assessed   Cervical / Trunk Assessment Cervical / Trunk Assessment: Kyphotic   Communication Communication Communication: HOH   Cognition Arousal/Alertness: Awake/alert Behavior During Therapy: Flat affect Overall Cognitive Status: No family/caregiver present to determine baseline cognitive functioning  General Comments: Dementia at baseline. No retention of education/information. Easily distracted. Continuous cues to stay on task. Difficult to re-direct   General Comments  VSS on RA. Ambulatory in hallway 200' with no AD and minguardA    Exercises     Shoulder Instructions      Home Living Family/patient expects to be discharged to:: Skilled nursing facility Living Arrangements: Children                               Additional Comments: performs ADL, but  requires assist with iADL tasks; pt becoming too much for family to take care of for safety reasons      Prior Functioning/Environment Level of Independence: Independent with assistive device(s)        Comments: SPC for ambulation, h/o falls        OT Problem List: Decreased strength;Decreased activity tolerance;Impaired balance (sitting and/or standing);Decreased safety awareness      OT Treatment/Interventions: Self-care/ADL training;Therapeutic exercise;Energy conservation;DME and/or AE instruction;Therapeutic activities;Patient/family education;Balance training    OT Goals(Current goals can be found in the care plan section) Acute Rehab OT Goals Patient Stated Goal: not stated OT Goal Formulation: Patient unable to participate in goal setting Time For Goal Achievement: 03/05/21 Potential to Achieve Goals: Good ADL Goals Additional ADL Goal #1: (P) Pt will increase to modified independence of OOB ADL with <2 cues for sequencing. Additional ADL Goal #2: (P) Pt will perform sustained attention task i.e. coloring, drawing, word find with minimal cues to attend to task x5 mins to increase activity tolerance.  OT Frequency: Min 2X/week   Barriers to D/C:            Co-evaluation              AM-PAC OT "6 Clicks" Daily Activity     Outcome Measure Help from another person eating meals?: None Help from another person taking care of personal grooming?: A Little Help from another person toileting, which includes using toliet, bedpan, or urinal?: A Little Help from another person bathing (including washing, rinsing, drying)?: A Little Help from another person to put on and taking off regular upper body clothing?: None Help from another person to put on and taking off regular lower body clothing?: A Little 6 Click Score: 20   End of Session Equipment Utilized During Treatment: Gait belt Nurse Communication: Mobility status  Activity Tolerance: Patient tolerated treatment  well Patient left: in chair;with call bell/phone within reach;with nursing/sitter in room  OT Visit Diagnosis: Unsteadiness on feet (R26.81);Muscle weakness (generalized) (M62.81)                Time: 6301-6010 OT Time Calculation (min): 25 min Charges:  OT General Charges $OT Visit: 1 Visit OT Evaluation $OT Eval Moderate Complexity: 1 Mod OT Treatments $Self Care/Home Management : 8-22 mins  Jefferey Pica, OTR/L Acute Rehabilitation Services Pager: [phone] Office: [place] [facility] 02/19/2021, 11:06 AM

## 2021-02-19 NOTE — Evaluation (Signed)
Physical Therapy Evaluation Patient Details Name: [name] MRN: [id number] DOB: 04/14/1946 Today's Date: 02/19/2021   History of Present Illness  Pt is a 75 y.o. male admitted 7/1 for dehydration. He presented to the ED from home after wondering around outside all day and family unable to get him to come back inside. PMH significant for COVID+ 02/07/21, hepatitis B, cirrhosis, hepatocellular carcinoma, pancytopenia, type 2 diabetes mellitus, sleep apnea with CPAP intolerance, and dementia. Pt for IVC.   Clinical Impression  Pt admitted with above diagnosis. PTA pt lived at home with family, mod I mobility with SPC. On eval, pt required supervision bed mobility, min guard assist transfers, and min guard assist ambulation 250' without AD. Pt easily distracted and difficult to redirect. Dementia at baseline. Pt currently with functional limitations due to the deficits listed below (see PT Problem List). Pt will benefit from skilled PT to increase their independence and safety with mobility to allow discharge to the venue listed below.       Follow Up Recommendations SNF;Supervision/Assistance - 24 hour    Equipment Recommendations  None recommended by PT    Recommendations for Other Services       Precautions / Restrictions Precautions Precautions: Fall;Other (comment) Precaution Comments: can be combative      Mobility  Bed Mobility Overal bed mobility: Needs Assistance Bed Mobility: Supine to Sit;Sit to Supine     Supine to sit: Supervision Sit to supine: Supervision   General bed mobility comments: supervision for safety    Transfers Overall transfer level: Needs assistance Equipment used: None Transfers: Sit to/from Stand;Stand Pivot Transfers Sit to Stand: Min guard Stand pivot transfers: Min guard       General transfer comment: min guard for safety, cues for sequencing/safety  Ambulation/Gait Ambulation/Gait assistance: Min guard Gait Distance (Feet):  250 Feet Assistive device: None Gait Pattern/deviations: Step-through pattern;Decreased stride length;Drifts right/left Gait velocity: decreased Gait velocity interpretation: 1.31 - 2.62 ft/sec, indicative of limited community ambulator General Gait Details: min guard for safety, continuous cues to stay on task, high distractability increasing risk for falls  Stairs            Wheelchair Mobility    Modified Rankin (Stroke Patients Only)       Balance Overall balance assessment: Needs assistance Sitting-balance support: No upper extremity supported;Feet supported Sitting balance-Leahy Scale: Good     Standing balance support: No upper extremity supported;During functional activity Standing balance-Leahy Scale: Fair                               Pertinent Vitals/Pain Pain Assessment: Faces Faces Pain Scale: No hurt    Home Living Family/patient expects to be discharged to:: Skilled nursing facility Living Arrangements: Children                    Prior Function Level of Independence: Independent with assistive device(s)         Comments: SPC for ambulation, h/o falls     Hand Dominance        Extremity/Trunk Assessment   Upper Extremity Assessment Upper Extremity Assessment: Overall WFL for tasks assessed    Lower Extremity Assessment Lower Extremity Assessment: Overall WFL for tasks assessed    Cervical / Trunk Assessment Cervical / Trunk Assessment: Kyphotic  Communication   Communication: HOH  Cognition Arousal/Alertness: Awake/alert Behavior During Therapy: Flat affect Overall Cognitive Status: No family/caregiver present to determine baseline cognitive  functioning                                 General Comments: dementia at baseline. No retention of education/information. Easily distracted. Continuous cues to stay on task. Difficult to re-direct      General Comments General comments (skin integrity,  edema, etc.): VSS on RA    Exercises     Assessment/Plan    PT Assessment Patient needs continued PT services  PT Problem List Decreased mobility;Decreased safety awareness;Decreased knowledge of precautions;Decreased cognition;Decreased balance       PT Treatment Interventions Functional mobility training;Balance training;Therapeutic activities;Gait training;Therapeutic exercise;Patient/family education    PT Goals (Current goals can be found in the Care Plan section)  Acute Rehab PT Goals Patient Stated Goal: not stated PT Goal Formulation: Patient unable to participate in goal setting Time For Goal Achievement: 03/05/21 Potential to Achieve Goals: Fair    Frequency Min 2X/week   Barriers to discharge        Co-evaluation               AM-PAC PT "6 Clicks" Mobility  Outcome Measure Help needed turning from your back to your side while in a flat bed without using bedrails?: None Help needed moving from lying on your back to sitting on the side of a flat bed without using bedrails?: A Little Help needed moving to and from a bed to a chair (including a wheelchair)?: A Little Help needed standing up from a chair using your arms (e.g., wheelchair or bedside chair)?: A Little Help needed to walk in hospital room?: A Little Help needed climbing 3-5 steps with a railing? : A Lot 6 Click Score: 18    End of Session Equipment Utilized During Treatment: Gait belt Activity Tolerance: Patient tolerated treatment well Patient left: in chair;with call bell/phone within reach;with nursing/sitter in room Nurse Communication: Mobility status PT Visit Diagnosis: Unsteadiness on feet (R26.81)    Time: 7412-8786 PT Time Calculation (min) (ACUTE ONLY): 14 min   Charges:   PT Evaluation $PT Eval Moderate Complexity: 1 Mod          Lorrin Goodell, PT  Office # [phone] Pager [phone]   Lorriane Shire 02/19/2021, 9:14 AM

## 2021-02-20 DIAGNOSIS — F0390 Unspecified dementia without behavioral disturbance: Secondary | ICD-10-CM | POA: Diagnosis not present

## 2021-02-20 LAB — CBC WITH DIFFERENTIAL/PLATELET
Abs Immature Granulocytes: 0.01 10*3/uL (ref 0.00–0.07)
Basophils Absolute: 0 10*3/uL (ref 0.0–0.1)
Basophils Relative: 1 %
Eosinophils Absolute: 0.1 10*3/uL (ref 0.0–0.5)
Eosinophils Relative: 2 %
HCT: 24.9 % — ABNORMAL LOW (ref 39.0–52.0)
Hemoglobin: 8.1 g/dL — ABNORMAL LOW (ref 13.0–17.0)
Immature Granulocytes: 0 %
Lymphocytes Relative: 28 %
Lymphs Abs: 0.8 10*3/uL (ref 0.7–4.0)
MCH: 33.2 pg (ref 26.0–34.0)
MCHC: 32.5 g/dL (ref 30.0–36.0)
MCV: 102 fL — ABNORMAL HIGH (ref 80.0–100.0)
Monocytes Absolute: 0.3 10*3/uL (ref 0.1–1.0)
Monocytes Relative: 10 %
Neutro Abs: 1.7 10*3/uL (ref 1.7–7.7)
Neutrophils Relative %: 59 %
Platelets: 78 10*3/uL — ABNORMAL LOW (ref 150–400)
RBC: 2.44 MIL/uL — ABNORMAL LOW (ref 4.22–5.81)
RDW: 14.8 % (ref 11.5–15.5)
WBC: 2.9 10*3/uL — ABNORMAL LOW (ref 4.0–10.5)
nRBC: 0 % (ref 0.0–0.2)

## 2021-02-20 LAB — GLUCOSE, CAPILLARY
Glucose-Capillary: 123 mg/dL — ABNORMAL HIGH (ref 70–99)
Glucose-Capillary: 158 mg/dL — ABNORMAL HIGH (ref 70–99)
Glucose-Capillary: 147 mg/dL — ABNORMAL HIGH (ref 70–99)
Glucose-Capillary: 158 mg/dL — ABNORMAL HIGH (ref 70–99)

## 2021-02-20 LAB — BASIC METABOLIC PANEL
Anion gap: 3 — ABNORMAL LOW (ref 5–15)
BUN: 10 mg/dL (ref 8–23)
CO2: 20 mmol/L — ABNORMAL LOW (ref 22–32)
Calcium: 8 mg/dL — ABNORMAL LOW (ref 8.9–10.3)
Chloride: 115 mmol/L — ABNORMAL HIGH (ref 98–111)
Creatinine, Ser: 0.96 mg/dL (ref 0.61–1.24)
GFR, Estimated: >60 mL/min (ref >60)
Glucose, Bld: 114 mg/dL — ABNORMAL HIGH (ref 70–99)
Potassium: 3.8 mmol/L (ref 3.5–5.1)
Sodium: 138 mmol/L (ref 135–145)

## 2021-02-20 LAB — AMMONIA: Ammonia: 78 umol/L — ABNORMAL HIGH (ref 9–35)

## 2021-02-20 MED ORDER — ADULT MULTIVITAMIN W/MINERALS CH
1.0000 | ORAL_TABLET | Freq: Every day | ORAL | Status: DC
  Administered 2021-02-20 – 2021-03-16 (×23): 1 via ORAL
  Filled 2021-02-20 (×25): qty 1

## 2021-02-20 MED ORDER — GLUCERNA SHAKE PO LIQD
237.0000 mL | Freq: Three times a day (TID) | ORAL | Status: DC
  Administered 2021-02-20 – 2021-03-16 (×61): 237 mL via ORAL

## 2021-02-20 MED ORDER — LACTULOSE 10 GM/15ML PO SOLN
30.0000 g | Freq: Three times a day (TID) | ORAL | Status: DC
  Administered 2021-02-20 – 2021-03-15 (×62): 30 g via ORAL
  Filled 2021-02-20 (×69): qty 45

## 2021-02-20 NOTE — Progress Notes (Signed)
PROGRESS NOTE    [name]  UXN:[id number] DOB: 1945-10-31 DOA: 02/17/2021 PCP: Wendie Agreste, MD   Chief Complain:Wandering around,dehydration  Brief Narrative: Patient is a 75 year old male with history of hepatitis B, cirrhosis, hepatocellular carcinoma, pancytopenia, diabetes, sleep apnea with CPAP intolerance, dementia who presents from home after he was found to be wandering outside and refusing to go back to home.  Patient was unable to contribute any history due to his dementia, disorientation.  On presentation he was tachycardic in the range of 130s, normalized with IV fluids.  As per the family, he he has been recently combative, wandering here and there and they have been working with a Education officer, museum with plan for nursing facility placement.  On presentation, lab work showed chronic pancytopenia, hemoglobin A1c of 8.6.  Patient was admitted for further management and unsafe discharge plan. Patient was started on IV fluids for mild AKI.  Lab work showed pancytopenia, we have ordered vitamin U54 and folic acid level.  Hemoglobin is currently stable.  His ammonia level was found to be high which could be associated with his history of cirrhosis, hepatitis B/HCC,started on lactulose.  He was IVCed by his son.  Plan is for skilled nursing facility placement.  TOC following Patient is medically stable for discharge to skilled nursing facility as soon as bed is available.   Assessment & Plan:   Principal Problem:   Dementia without behavioral disturbance (HCC) Active Problems:   OSA (obstructive sleep apnea)   Cancer, hepatocellular (HCC)   Memory impairment of gradual onset   Pancytopenia (HCC)   Psychosis (HCC)   Dehydration   Renal insufficiency  Dehydration/AKI: Significantly improved with IV fluids.Fluids D/Ced  Dementia with behavioral disturbance: Patient was found to be wandering outside the home.  Family were trying to place him in a skilled nursing facility.   Patient noncompliant with medications.  Family brought him to the emergency department and IVCed. Currently patient is not agitated.  PT/OT consulted, recommended skilled nursing facility.  Social worker consulted for possible consideration of SNF placement We  also requested psychiatry evaluation.  Recommended to start on citalopram for anxiety .He is also on Seroquel.  Confusion/encephalopathy/elevated ammonia level: Secondary to combination of dementia /behavioral disturbance and cud also be  elevated ammonia level.  Monitor mental status.  He has chronically elevated ammonia level secondary to cirrhosis/HCC.  Continue lactulose.  Patient is noncompliant  History of hepatitis B/cirrhosis/hepatocellular carcinoma: Follows with Dr. Learta Codding.  Has history of stage I hepatocellular carcinoma.  He is status post partial left hepatectomy on 02/2013.   Pancytopenia: Secondary to liver cirrhosis/hepatocellular carcinoma.  Vitamin B12 and folate level normal.  Type 2 diabetes mellitus: Recent hemoglobin A1c of 7.3 as per 08/2020.  Continue sliding scale insulin.  Monitor blood sugars         DVT prophylaxis: SCD Code Status: Full code Family Communication: Discussed with son on phone on 02/18/2021 Status is: Observation  The patient remains OBS appropriate and will d/c before 2 midnights.  Dispo: The patient is from: Home              Anticipated d/c is to: SNF              Patient currently is medically stable to d/c.   Difficult to place patient No     Consultants: None  Procedures:None  Antimicrobials:  Anti-infectives (From admission, onward)    None       Subjective:  Patient seen and  examined the bedside this morning.  Hemodynamically stable.  Lying on the bed.  Sitter at the bedside.  Denies any complaints.  Not agitated  Objective: Vitals:   02/20/21 0115 02/20/21 0215 02/20/21 0500 02/20/21 0845  BP: (!) 78/40 (!) 91/40 (!) 93/53 (!) 95/51  Pulse: 68 71 65 76   Resp: 18 18 18 16   Temp: (!) 97.1 F (36.2 C) 98.5 F (36.9 C) 98.3 F (36.8 C) 97.8 F (36.6 C)  TempSrc: Axillary Oral Oral Oral  SpO2: 100% 100% 100% 100%  Weight:        Intake/Output Summary (Last 24 hours) at 02/20/2021 1115 Last data filed at 02/20/2021 0955 Gross per 24 hour  Intake 600 ml  Output --  Net 600 ml   Filed Weights   02/18/21 2000  Weight: 80.6 kg    Examination:  General exam: Overall comfortable, not in distress, deconditioned HEENT: Extremely hard of hearing Respiratory system:  no wheezes or crackles  Cardiovascular system: S1 & S2 heard, RRR.  Gastrointestinal system: Abdomen is nondistended, soft and nontender. Central nervous system: Alert and awake but not oriented Extremities: No edema, no clubbing ,no cyanosis Skin: No rashes, no ulcers,no icterus       Data Reviewed: I have personally reviewed following labs and imaging studies  CBC: Recent Labs  Lab 02/17/21 1843 02/18/21 0319 02/19/21 0140 02/20/21 0105  WBC 3.0* 2.4* 1.9* 2.9*  NEUTROABS 2.2  --  1.0* 1.7  HGB 8.6* 8.2* 7.9* 8.1*  HCT 27.4* 26.1* 25.1* 24.9*  MCV 104.6* 104.8* 103.7* 102.0*  PLT 62* 60* 73* 78*   Basic Metabolic Panel: Recent Labs  Lab 02/17/21 1843 02/18/21 0319 02/19/21 0140 02/20/21 0105  NA 144 143 141 138  K 4.3 4.0 3.5 3.8  CL 118* 118* 114* 115*  CO2 18* 20* 19* 20*  GLUCOSE 89 169* 116* 114*  BUN 17 15 12 10   CREATININE 1.38* 1.38* 1.02 0.96  CALCIUM 8.5* 8.5* 8.0* 8.0*   GFR: Estimated Creatinine Clearance: 65.5 mL/min (by C-G formula based on SCr of 0.96 mg/dL). Liver Function Tests: No results for input(s): AST, ALT, ALKPHOS, BILITOT, PROT, ALBUMIN in the last 168 hours. No results for input(s): LIPASE, AMYLASE in the last 168 hours. Recent Labs  Lab 02/18/21 0319 02/19/21 0140 02/20/21 0105  AMMONIA 69* 50* 78*   Coagulation Profile: No results for input(s): INR, PROTIME in the last 168 hours. Cardiac Enzymes: No results for  input(s): CKTOTAL, CKMB, CKMBINDEX, TROPONINI in the last 168 hours. BNP (last 3 results) No results for input(s): PROBNP in the last 8760 hours. HbA1C: No results for input(s): HGBA1C in the last 72 hours. CBG: Recent Labs  Lab 02/19/21 0745 02/19/21 1257 02/19/21 1615 02/19/21 2101 02/20/21 0821  GLUCAP 129* 205* 161* 120* 123*   Lipid Profile: No results for input(s): CHOL, HDL, LDLCALC, TRIG, CHOLHDL, LDLDIRECT in the last 72 hours. Thyroid Function Tests: No results for input(s): TSH, T4TOTAL, FREET4, T3FREE, THYROIDAB in the last 72 hours. Anemia Panel: Recent Labs    02/19/21 0140  VITAMINB12 430  FOLATE 14.9   Sepsis Labs: No results for input(s): PROCALCITON, LATICACIDVEN in the last 168 hours.  No results found for this or any previous visit (from the past 240 hour(s)).       Radiology Studies: No results found.      Scheduled Meds:  sodium chloride   Intravenous Once   citalopram  10 mg Oral Daily   donepezil  10 mg Oral  QHS   ferrous sulfate  325 mg Oral TID WC   insulin aspart  0-6 Units Subcutaneous TID WC   lactulose  30 g Oral TID   memantine  5 mg Oral BID   pravastatin  20 mg Oral QHS   QUEtiapine  50 mg Oral QHS   Continuous Infusions:   LOS: 0 days    Time spent: 25 mins.More than 50% of that time was spent in counseling and/or coordination of care.      Shelly Coss, MD [facility] P7/11/2020, 11:15 AM

## 2021-02-20 NOTE — Progress Notes (Signed)
CSW acknowledges consult for SNF placement. The patient is not appropriate for SNF at this time and insurance would likely not cover rehab stay as issues are cognitive and custodial in nature. CSW notes patient's son has been working with chronic care management to get things in order to place patient in a memory care facility due to wandering from home, which is a private pay cost. CSW will touch base with patient's son to determine progress. Patient tested COVID + on 02/07/21.   Gilmore Laroche, MSW, [facility]

## 2021-02-20 NOTE — Progress Notes (Addendum)
Initial Nutrition Assessment  DOCUMENTATION CODES:   Non-severe (moderate) malnutrition in context of chronic illness  INTERVENTION:   -Glucerna Shake po TID, each supplement provides 220 kcal and 10 grams of protein  -Magic cup TID with meals, each supplement provides 290 kcal and 9 grams of protein  -MVI with minerals daily -Liberalize diet to regular   NUTRITION DIAGNOSIS:   Moderate Malnutrition related to chronic illness (dementia) as evidenced by mild fat depletion, moderate fat depletion, mild muscle depletion, moderate muscle depletion.  GOAL:   Patient will meet greater than or equal to 90% of their needs  MONITOR:   PO intake, Supplement acceptance, Labs, Weight trends, Skin, I & O's  REASON FOR ASSESSMENT:   Malnutrition Screening Tool    ASSESSMENT:   [name] is a 75 y.o. male with medical history significant for hepatitis B, cirrhosis, hepatocellular carcinoma, pancytopenia, type 2 diabetes mellitus, sleep apnea with CPAP intolerance, and dementia, now presenting from home where he had been wandering outside all day and refusing to go back inside.  Patient is unable to contribute to the history due to his dementia and disorientation.  Heart rate was reportedly in the 130s with EMS initially and normalized with IV fluids prior to arrival in the ED.  Family reported that the patient has been wandering more recently, occasionally combative, and family has been working with Education officer, museum with plan for likely nursing facility placement.  Pt admitted with dehydration and renal insufficiency.    Case discussed with safety sitter, who reports pt eating well, consumed 75% of his breakfast of pancakes and sausage.   Spoke with pt at bedside, who reports frustration about being in the hospital ("they kidnapped me against my will"). RD attempted to reorient pt to place and situation. He reports good appetite, consuming most of his food ("I will eat it if it's  something I like"). Pt very tangential during interview, so very difficult to obtain a diet recall. He reports "I eat when I can" and often has friends or family bring food to him, however, "I have to fight for it". Attempted to clarify this statement with pt without success. Pt with multiple missing teet, but denies difficulty chewing or swallowing foods.   Pt unsure of UBW of if he has lost weight. Reviewed wt hx; pt has experienced a 3% wt loss over the past 3 months, which is not significant for time frame.   Discussed importance of good meal and supplement intake to promote healing.   Medications reviewed and include lactulose.   Lab Results  Component Value Date   HGBA1C 7.3 (H) 09/01/2020   PTA DM medications are 500 mg metformin BID. Per ADA's Standards of Medical Care of Diabetes, glycemic targets for older adults who have multiple co-morbidities, cognitive impairments, and functional dependence should be less stringent (Hgb A1c <8.0-8.5).    Labs reviewed: CBGS: 130-161 (inpatient orders for glycemic control are 0-6 units insulin aspart TID with meals).    NUTRITION - FOCUSED PHYSICAL EXAM:  Flowsheet Row Most Recent Value  Orbital Region Moderate depletion  Upper Arm Region Moderate depletion  Thoracic and Lumbar Region Moderate depletion  Buccal Region Mild depletion  Temple Region Severe depletion  Clavicle Bone Region Moderate depletion  Clavicle and Acromion Bone Region Moderate depletion  Scapular Bone Region Moderate depletion  Dorsal Hand Moderate depletion  Patellar Region Moderate depletion  Anterior Thigh Region Moderate depletion  Posterior Calf Region Moderate depletion  Edema (RD Assessment) None  Hair Reviewed  Eyes Reviewed  Mouth Reviewed  Skin Reviewed  Nails Reviewed       Diet Order:   Diet Order             Diet Heart Room service appropriate? Yes; Fluid consistency: Thin  Diet effective now                   EDUCATION NEEDS:   Not  appropriate for education at this time  Skin:  Skin Assessment: Reviewed RN Assessment  Last BM:  Unknown  Height:   Ht Readings from Last 1 Encounters:  01/30/21 5\' 9"  (1.753 m)    Weight:   Wt Readings from Last 1 Encounters:  02/18/21 80.6 kg    Ideal Body Weight:  72.7 kg  BMI:  Body mass index is 26.24 kg/m.  Estimated Nutritional Needs:   Kcal:  2200-2400  Protein:  120-135 grams  Fluid:  > 2 L    Loistine Chance, RD, LDN, [place] Registered Dietitian II Certified Diabetes Care and Education Specialist Please refer to [facility] for RD and/or RD on-call/weekend/after hours pager

## 2021-02-20 NOTE — NC FL2 (Addendum)
[place] LEVEL OF CARE SCREENING TOOL     IDENTIFICATION  Patient Name: [name] Birthdate: [birth year] Sex: male Admission Date (Current Location): 02/17/2021  [facility] and [place] Number:  Herbalist and Address:  [facility] [place], [place] [address] [place], [place] [address]      Provider Number: [id number]  Attending Physician Name and Address:  Shelly Coss, MD  Relative Name and Phone Number:  [name]   [phone]    Current Level of Care: Hospital Recommended Level of Care: Memory Care Prior Approval Number:    Date Approved/Denied:   PASRR Number:    Discharge Plan: Other (Comment) (memory care)    Current Diagnoses: Patient Active Problem List   Diagnosis Date Noted   Dementia without behavioral disturbance ([place])    Dehydration 02/18/2021   Renal insufficiency 02/18/2021   Psychosis ([place]) 10/06/2020   Iron deficiency anemia 11/04/2018   Orthostatic hypotension 08/06/2018   Hypertension 08/06/2018   Hyperlipidemia 08/06/2018   Pancytopenia ([place]) 08/06/2018   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   Insufficient treatment with nasal CPAP 05/05/2018   MCI (mild cognitive impairment) with memory loss 05/05/2018   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   Irregular heart beat 01/28/2018   Noncompliance with CPAP treatment 08/14/2017   Excessive daytime sleepiness 04/09/2017   Snoring 04/09/2017   Memory impairment of gradual onset 04/09/2017   Aortic atherosclerosis ([place]) 11/27/2016   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 11/11/2013   Altered mental status 03/05/2013   Respiratory failure, post-operative ([place]) 03/02/2013   Hepatitis B 02/24/2013   Cancer, hepatocellular ([place]) 02/10/2013   Liver tumor-bleeding 01/15/2013   Epidermal cyst 06/18/2012   OSA (obstructive sleep apnea) 03/13/2012   Abnormal leg movement 12/12/2011   Blood pressure elevated 09/06/2011   Diabetes mellitus  ([place]) 09/06/2011   Narcolepsy 09/06/2011   BPH (benign prostatic hyperplasia) 09/06/2011   Diverticula of colon 09/06/2011    Orientation RESPIRATION BLADDER Height & Weight     Self  Normal Continent Weight: 177 lb 11.1 oz (80.6 kg) Height:     BEHAVIORAL SYMPTOMS/MOOD NEUROLOGICAL BOWEL NUTRITION STATUS  Wanderer   Continent Diet (no added salt)  AMBULATORY STATUS COMMUNICATION OF NEEDS Skin   Supervision Verbally Normal                       Personal Care Assistance Level of Assistance  Bathing, Feeding, Dressing Bathing Assistance: Limited assistance Feeding assistance: Independent Dressing Assistance: Limited assistance     Functional Limitations Info  Sight, Hearing, Speech Sight Info: Adequate Hearing Info: Impaired Speech Info: Adequate    SPECIAL CARE FACTORS FREQUENCY                       Contractures Contractures Info: Not present    Additional Factors Info  Code Status, Allergies, Psychotropic, Insulin Sliding Scale Code Status Info: Full Allergies Info: Ace Inhibitors Psychotropic Info: Celexa, Seroquel, Aricept, Namenda Insulin Sliding Scale Info: See dc summary for dose       Current Medications (02/20/2021):  This is the current hospital active medication list Current Facility-Administered Medications  Medication Dose Route Frequency Provider Last Rate Last Admin   0.9 %  sodium chloride infusion (Manually program via Guardrails IV Fluids)   Intravenous Once Opyd, Ilene Qua, MD       acetaminophen (TYLENOL) tablet 650 mg  650 mg Oral Q6H PRN Opyd, Ilene Qua, MD  Or   acetaminophen (TYLENOL) suppository 650 mg  650 mg Rectal Q6H PRN Opyd, Ilene Qua, MD       citalopram (CELEXA) tablet 10 mg  10 mg Oral Daily Akintayo, Mojeed, MD   10 mg at 02/20/21 0932   donepezil (ARICEPT) tablet 10 mg  10 mg Oral QHS Adhikari, Tamsen Meek, MD   10 mg at 02/19/21 2103   ferrous sulfate tablet 325 mg  325 mg Oral TID WC Adhikari, Amrit, MD   325 mg at  02/20/21 0932   insulin aspart (novoLOG) injection 0-6 Units  0-6 Units Subcutaneous TID WC Opyd, Ilene Qua, MD   1 Units at 02/19/21 1717   lactulose (CHRONULAC) 10 GM/15ML solution 30 g  30 g Oral TID Shelly Coss, MD   30 g at 02/20/21 0932   memantine (NAMENDA) tablet 5 mg  5 mg Oral BID Shelly Coss, MD   5 mg at 02/20/21 0932   ondansetron (ZOFRAN) tablet 4 mg  4 mg Oral Q6H PRN Opyd, Ilene Qua, MD       Or   ondansetron (ZOFRAN) injection 4 mg  4 mg Intravenous Q6H PRN Opyd, Ilene Qua, MD       pravastatin (PRAVACHOL) tablet 20 mg  20 mg Oral QHS Shelly Coss, MD   20 mg at 02/19/21 2102   QUEtiapine (SEROQUEL) tablet 50 mg  50 mg Oral QHS Shelly Coss, MD   50 mg at 02/19/21 2102   senna-docusate (Senokot-S) tablet 1 tablet  1 tablet Oral QHS PRN Opyd, Ilene Qua, MD         Discharge Medications: Please see discharge summary for a list of discharge medications.  Relevant Imaging Results:  Relevant Lab Results:   Additional Information SSN# [id number]/ Has had 2 Covid vaccines plus 1 booster [employer] COVID-19 Vaccine 06/28/2020 , 10/28/2019 , 10/03/2019)  Benard Halsted, LCSW

## 2021-02-21 ENCOUNTER — Inpatient Hospital Stay: Payer: Medicare Other | Attending: Oncology

## 2021-02-21 DIAGNOSIS — F0390 Unspecified dementia without behavioral disturbance: Secondary | ICD-10-CM | POA: Diagnosis not present

## 2021-02-21 LAB — GLUCOSE, CAPILLARY
Glucose-Capillary: 104 mg/dL — ABNORMAL HIGH (ref 70–99)
Glucose-Capillary: 150 mg/dL — ABNORMAL HIGH (ref 70–99)
Glucose-Capillary: 118 mg/dL — ABNORMAL HIGH (ref 70–99)
Glucose-Capillary: 150 mg/dL — ABNORMAL HIGH (ref 70–99)

## 2021-02-21 LAB — AMMONIA: Ammonia: 57 umol/L — ABNORMAL HIGH (ref 9–35)

## 2021-02-21 NOTE — Progress Notes (Signed)
PROGRESS NOTE    [name]  TFT:[id number] DOB: [birth year] DOA: 02/17/2021 PCP: Wendie Agreste, MD   Chief Complain:Wandering around,dehydration  Brief Narrative: Patient is a 75 year old male with history of hepatitis B, cirrhosis, hepatocellular carcinoma, pancytopenia, diabetes, sleep apnea with CPAP intolerance, dementia who presents from home after he was found to be wandering outside and refusing to go back to home.  Patient was unable to contribute any history due to his dementia, disorientation.  On presentation he was tachycardic in the range of 130s, normalized with IV fluids.  As per the family, he he has been recently combative, wandering here and there and they have been working with a Education officer, museum with plan for nursing facility placement.  On presentation, lab work showed chronic pancytopenia, hemoglobin A1c of 8.6.  Patient was admitted for further management and unsafe discharge plan. Patient was started on IV fluids for mild AKI.  Lab work showed pancytopenia, we have ordered vitamin H06 and folic acid level.  Hemoglobin is currently stable.  His ammonia level was found to be high which could be associated with his history of cirrhosis, hepatitis B/HCC,started on lactulose.  He was IVCed by his son.  Plan is for skilled nursing facility placement.  TOC following Patient is medically stable for discharge to skilled nursing facility as soon as bed is available.   Assessment & Plan:   Principal Problem:   Dementia without behavioral disturbance (HCC) Active Problems:   OSA (obstructive sleep apnea)   Cancer, hepatocellular (HCC)   Memory impairment of gradual onset   Pancytopenia (HCC)   Psychosis (HCC)   Dehydration   Renal insufficiency  Dehydration/AKI: Significantly improved with IV fluids.Fluids D/Ced  Dementia with behavioral disturbance: Patient was found to be wandering outside the home.  Family were trying to place him in a skilled nursing facility.   Patient noncompliant with medications.  Family brought him to the emergency department and IVCed. Currently patient is not agitated.  PT/OT consulted, recommended skilled nursing facility.  Social worker consulted for possible consideration of SNF placement We  also requested psychiatry evaluation.  Recommended to start on citalopram for anxiety .He is also on Seroquel.  Confusion/encephalopathy/elevated ammonia level: Secondary to combination of dementia /behavioral disturbance and cud also be  elevated ammonia level.  Monitor mental status.  He has chronically elevated ammonia level secondary to cirrhosis/HCC.  Continue lactulose.  Patient is noncompliant  History of hepatitis B/cirrhosis/hepatocellular carcinoma: Follows with Dr. Learta Codding.  Has history of stage I hepatocellular carcinoma.  He is status post partial left hepatectomy on 02/2013.   Pancytopenia: Secondary to liver cirrhosis/hepatocellular carcinoma.  Vitamin B12 and folate level normal.  Type 2 diabetes mellitus: Recent hemoglobin A1c of 7.3 as per 08/2020.  Continue sliding scale insulin.  Monitor blood sugars  Low blood pressure: Most likely this is chronic and may be associated with cirrhosis.  Patient is asymptomatic .  Nutrition Problem: Moderate Malnutrition Etiology: chronic illness (dementia)      DVT prophylaxis: SCD Code Status: Full code Family Communication: Discussed with son on phone on 02/18/2021 Status is: Observation  The patient remains OBS appropriate and will d/c before 2 midnights.  Dispo: The patient is from: Home              Anticipated d/c is to: SNF              Patient currently is medically stable to d/c.   Difficult to place patient No  Consultants: None  Procedures:None  Antimicrobials:  Anti-infectives (From admission, onward)    None       Subjective:  Patient seen and examined the bedside this morning.  Overall comfortable.  Lying in bed.  Sitter at the bedside.  Denies  any complaints.  Objective: Vitals:   02/20/21 2056 02/21/21 0039 02/21/21 0602 02/21/21 0734  BP: (!) 105/55 (!) 96/56 (!) 105/52 (!) 97/46  Pulse: 76 78 72 68  Resp: 16 16 20 18   Temp: 98.8 F (37.1 C) 98.7 F (37.1 C) 97.7 F (36.5 C) 98.7 F (37.1 C)  TempSrc: Oral Axillary Oral Oral  SpO2: 100% 100% 99% 100%  Weight:        Intake/Output Summary (Last 24 hours) at 02/21/2021 0755 Last data filed at 02/20/2021 2119 Gross per 24 hour  Intake 960 ml  Output --  Net 960 ml   Filed Weights   02/18/21 2000  Weight: 80.6 kg    Examination:  General exam: Overall comfortable, not in distress, deconditioned, debilitated, pleasantly confused HEENT: Hard of hearing Respiratory system:  no wheezes or crackles  Cardiovascular system: S1 & S2 heard, RRR.  Gastrointestinal system: Abdomen is nondistended, soft and nontender. Central nervous system: Alert and awake but not oriented Extremities: No edema, no clubbing ,no cyanosis Skin: No rashes, no ulcers,no icterus      Data Reviewed: I have personally reviewed following labs and imaging studies  CBC: Recent Labs  Lab 02/17/21 1843 02/18/21 0319 02/19/21 0140 02/20/21 0105  WBC 3.0* 2.4* 1.9* 2.9*  NEUTROABS 2.2  --  1.0* 1.7  HGB 8.6* 8.2* 7.9* 8.1*  HCT 27.4* 26.1* 25.1* 24.9*  MCV 104.6* 104.8* 103.7* 102.0*  PLT 62* 60* 73* 78*   Basic Metabolic Panel: Recent Labs  Lab 02/17/21 1843 02/18/21 0319 02/19/21 0140 02/20/21 0105  NA 144 143 141 138  K 4.3 4.0 3.5 3.8  CL 118* 118* 114* 115*  CO2 18* 20* 19* 20*  GLUCOSE 89 169* 116* 114*  BUN 17 15 12 10   CREATININE 1.38* 1.38* 1.02 0.96  CALCIUM 8.5* 8.5* 8.0* 8.0*   GFR: Estimated Creatinine Clearance: 65.5 mL/min (by C-G formula based on SCr of 0.96 mg/dL). Liver Function Tests: No results for input(s): AST, ALT, ALKPHOS, BILITOT, PROT, ALBUMIN in the last 168 hours. No results for input(s): LIPASE, AMYLASE in the last 168 hours. Recent Labs  Lab  02/18/21 0319 02/19/21 0140 02/20/21 0105 02/21/21 0157  AMMONIA 69* 50* 78* 57*   Coagulation Profile: No results for input(s): INR, PROTIME in the last 168 hours. Cardiac Enzymes: No results for input(s): CKTOTAL, CKMB, CKMBINDEX, TROPONINI in the last 168 hours. BNP (last 3 results) No results for input(s): PROBNP in the last 8760 hours. HbA1C: No results for input(s): HGBA1C in the last 72 hours. CBG: Recent Labs  Lab 02/20/21 0821 02/20/21 1246 02/20/21 1646 02/20/21 2059 02/21/21 0743  GLUCAP 123* 158* 147* 158* 104*   Lipid Profile: No results for input(s): CHOL, HDL, LDLCALC, TRIG, CHOLHDL, LDLDIRECT in the last 72 hours. Thyroid Function Tests: No results for input(s): TSH, T4TOTAL, FREET4, T3FREE, THYROIDAB in the last 72 hours. Anemia Panel: Recent Labs    02/19/21 0140  VITAMINB12 430  FOLATE 14.9   Sepsis Labs: No results for input(s): PROCALCITON, LATICACIDVEN in the last 168 hours.  No results found for this or any previous visit (from the past 240 hour(s)).       Radiology Studies: No results found.  Scheduled Meds:  sodium chloride   Intravenous Once   citalopram  10 mg Oral Daily   donepezil  10 mg Oral QHS   feeding supplement (GLUCERNA SHAKE)  237 mL Oral TID BM   ferrous sulfate  325 mg Oral TID WC   insulin aspart  0-6 Units Subcutaneous TID WC   lactulose  30 g Oral TID   memantine  5 mg Oral BID   multivitamin with minerals  1 tablet Oral Daily   pravastatin  20 mg Oral QHS   QUEtiapine  50 mg Oral QHS   Continuous Infusions:   LOS: 0 days    Time spent: 25 mins.More than 50% of that time was spent in counseling and/or coordination of care.      Shelly Coss, MD [facility] P7/12/2020, 7:55 AM

## 2021-02-21 NOTE — TOC Initial Note (Addendum)
Transition of Care [facility]) - Initial/Assessment Note    Patient Details  Name: [name] MRN: [id number] Date of Birth: [birth year]  [facility] CM/SW Contact:    Benard Halsted, LCSW Phone Number: 02/21/2021, 9:29 AM  Clinical Narrative:                 9:29am-CSW spoke with patient's son, [name], regarding where he is at in the [place] process. He stated that patient has a POA, Suanne Marker, who he believes has completed the Medicaid application. He stated that patient may need to sell his house but the family has not reached an agreement on that yet. Patient only receives $1800/month with Social Security, which is not enough to pay for a memory care facility. Patient has become increasingly unsafe at home with his son due to his wandering behaviors. CSW will follow up with [facility] leadership on available options.   3:25pm-CSW received call from [place] worker Bubba Hales [phone]. She stated she is following patient to assist with placement. CSW explained financial constraints without Medicaid approval. She stated that patient would be excluded from Medicaid due to owning a house but she is going to contact his POA, Suanne Marker, to see if she will consent for APS services, which could open up more assistance.   Expected Discharge Plan: Memory Care Barriers to Discharge: Inadequate or no insurance, Financial Resources, Requiring sitter/restraints   Patient Goals and CMS Choice Patient states their goals for this hospitalization and ongoing recovery are:: needs memory care CMS Medicare.gov Compare Post Acute Care list provided to:: Patient Represenative (must comment) (son) Choice offered to / list presented to : Adult Children  Expected Discharge Plan and Services Expected Discharge Plan: Memory Care In-house Referral: Clinical Social Work   Post Acute Care Choice: Nursing Home Living arrangements for the past 2 months: Apartment                                      Prior  Living Arrangements/Services Living arrangements for the past 2 months: Apartment Lives with:: Adult Children Patient language and need for interpreter reviewed:: Yes Do you feel safe going back to the place where you live?: Yes      Need for Family Participation in Patient Care: Yes (Comment) Care giver support system in place?: Yes (comment)   Criminal Activity/Legal Involvement Pertinent to Current Situation/Hospitalization: No - Comment as needed  Activities of Daily Living Home Assistive Devices/Equipment: Cane (specify quad or straight) ADL Screening (condition at time of admission) Patient's cognitive ability adequate to safely complete daily activities?: No Is the patient deaf or have difficulty hearing?: No Does the patient have difficulty seeing, even when wearing glasses/contacts?: No Does the patient have difficulty concentrating, remembering, or making decisions?: Yes Patient able to express need for assistance with ADLs?: Yes Does the patient have difficulty dressing or bathing?: Yes Independently performs ADLs?: No Does the patient have difficulty walking or climbing stairs?: Yes Weakness of Legs: None Weakness of Arms/Hands: None  Permission Sought/Granted Permission sought to share information with : Facility Sport and exercise psychologist, Family Supports Permission granted to share information with : No  Share Information with NAME: [name] [phone]  Permission granted to share info w AGENCY: Facilities  Permission granted to share info w Relationship: [name] [phone]  Permission granted to share info w Contact Information: [name] [phone]  Emotional Assessment Appearance:: Appears stated age Attitude/Demeanor/Rapport: Unable  to Assess Affect (typically observed): Unable to Assess Orientation: : Oriented to Self Alcohol / Substance Use: Not Applicable Psych Involvement: Yes (comment)  Admission diagnosis:  Dehydration  [E86.0] Aggressive behavior [R46.89] Pancytopenia ([place]) [D61.818] Dementia with behavioral disturbance, unspecified dementia type ([place]) [F03.91] Patient Active Problem List   Diagnosis Date Noted   Dehydration 02/18/2021   Renal insufficiency 02/18/2021   Dementia without behavioral disturbance ([place])    Psychosis ([place]) 10/06/2020   Iron deficiency anemia 11/04/2018   Orthostatic hypotension 08/06/2018   Hypertension 08/06/2018   Hyperlipidemia 08/06/2018   Pancytopenia ([place]) 08/06/2018   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   Insufficient treatment with nasal CPAP 05/05/2018   MCI (mild cognitive impairment) with memory loss 05/05/2018   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   Irregular heart beat 01/28/2018   Noncompliance with CPAP treatment 08/14/2017   Excessive daytime sleepiness 04/09/2017   Snoring 04/09/2017   Memory impairment of gradual onset 04/09/2017   Aortic atherosclerosis ([place]) 11/27/2016   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 11/11/2013   Altered mental status 03/05/2013   Respiratory failure, post-operative ([place]) 03/02/2013   Hepatitis B 02/24/2013   Cancer, hepatocellular ([place]) 02/10/2013   Liver tumor-bleeding 01/15/2013   Epidermal cyst 06/18/2012   OSA (obstructive sleep apnea) 03/13/2012   Abnormal leg movement 12/12/2011   Blood pressure elevated 09/06/2011   Diabetes mellitus ([place]) 09/06/2011   Narcolepsy 09/06/2011   BPH (benign prostatic hyperplasia) 09/06/2011   Diverticula of colon 09/06/2011   PCP:  Wendie Agreste, MD Pharmacy:   [facility] [place] [place] - [address] [place] [address] Phone: [phone] Fax: [phone]     Social Determinants of Health ([place]) Interventions    Readmission Risk Interventions No flowsheet data found.

## 2021-02-22 DIAGNOSIS — F0390 Unspecified dementia without behavioral disturbance: Secondary | ICD-10-CM | POA: Diagnosis not present

## 2021-02-22 DIAGNOSIS — E440 Moderate protein-calorie malnutrition: Secondary | ICD-10-CM | POA: Insufficient documentation

## 2021-02-22 LAB — BPAM RBC
Blood Product Expiration Date: 202207262359
Unit Type and Rh: 7300

## 2021-02-22 LAB — TYPE AND SCREEN
ABO/RH(D): B POS
Antibody Screen: POSITIVE
Donor AG Type: NEGATIVE
Unit division: 0

## 2021-02-22 LAB — GLUCOSE, CAPILLARY
Glucose-Capillary: 146 mg/dL — ABNORMAL HIGH (ref 70–99)
Glucose-Capillary: 178 mg/dL — ABNORMAL HIGH (ref 70–99)
Glucose-Capillary: 142 mg/dL — ABNORMAL HIGH (ref 70–99)

## 2021-02-22 NOTE — Progress Notes (Signed)
PROGRESS NOTE    [name]  IWL:[id number] DOB: 1946-01-18 DOA: 02/17/2021 PCP: Wendie Agreste, MD   Chief Complain:Wandering around,dehydration  Brief Narrative: Patient is a 75 year old male with history of hepatitis B, cirrhosis, hepatocellular carcinoma, pancytopenia, diabetes, sleep apnea with CPAP intolerance, dementia who presents from home after he was found to be wandering outside and refusing to go back to home.  Patient was unable to contribute any history due to his dementia, disorientation.  On presentation he was tachycardic in the range of 130s, normalized with IV fluids.  As per the family, he he has been recently combative, wandering here and there and they have been working with a Education officer, museum with plan for nursing facility placement.  On presentation, lab work showed chronic pancytopenia, hemoglobin A1c of 8.6.  Patient was admitted for further management and unsafe discharge plan. Patient was started on IV fluids for mild AKI.  Lab work showed pancytopenia, we have ordered vitamin R74 and folic acid level.  Hemoglobin is currently stable.  His ammonia level was found to be high which could be associated with his history of cirrhosis, hepatitis B/HCC,started on lactulose.  He was IVCed by his son.  Plan is for skilled nursing facility placement.  TOC following Patient is medically stable for discharge to skilled nursing facility as soon as bed is available.   Assessment & Plan:   Principal Problem:   Dementia without behavioral disturbance (HCC) Active Problems:   OSA (obstructive sleep apnea)   Cancer, hepatocellular (HCC)   Memory impairment of gradual onset   Pancytopenia (HCC)   Psychosis (HCC)   Dehydration   Renal insufficiency  Dehydration/AKI: Significantly improved with IV fluids.Fluids D/Ced  Dementia with behavioral disturbance: Patient was found to be wandering outside the home.  Family were trying to place him in a skilled nursing facility.   Patient noncompliant with medications.  Family brought him to the emergency department and IVCed. Currently patient is not agitated.  PT/OT consulted, recommended skilled nursing facility.  Social worker consulted for possible consideration of SNF placement/memory care. We  also requested psychiatry evaluation.  Recommended to start on citalopram for anxiety .He is also on Seroquel.  Confusion/encephalopathy/elevated ammonia level: Secondary to combination of dementia /behavioral disturbance and cud also be  elevated ammonia level.  Monitor mental status.  He has chronically elevated ammonia level secondary to cirrhosis/HCC.  Continue lactulose.  Patient is noncompliant  History of hepatitis B/cirrhosis/hepatocellular carcinoma: Follows with Dr. Learta Codding.  Has history of stage I hepatocellular carcinoma.  He is status post partial left hepatectomy on 02/2013.   Pancytopenia: Secondary to liver cirrhosis/hepatocellular carcinoma.  Vitamin B12 and folate level normal.  Type 2 diabetes mellitus: Recent hemoglobin A1c of 7.3 as per 08/2020.  Continue sliding scale insulin.  Monitor blood sugars  Low blood pressure: Most likely this is chronic and may be associated with cirrhosis.  Patient is asymptomatic .  Nutrition Problem: Moderate Malnutrition Etiology: chronic illness (dementia)      DVT prophylaxis: SCD Code Status: Full code Family Communication: Discussed with son on phone on 02/18/2021 Status is: Observation  The patient remains OBS appropriate and will d/c before 2 midnights.  Dispo: The patient is from: Home              Anticipated d/c is to: SNF              Patient currently is medically stable to d/c.   Difficult to place patient :yes  Consultants: None  Procedures:None  Antimicrobials:  Anti-infectives (From admission, onward)    None       Subjective:  Patient seen and examined bedside this morning.  Lying in bed, sitter at the bedside.  He was sleeping  when I entered the room, woke up and talked.  Alert and awake but not oriented  Objective: Vitals:   02/22/21 0355 02/22/21 0357 02/22/21 0737 02/22/21 1150  BP:  (!) 105/57 (!) 106/54 117/60  Pulse:  78 74 74  Resp:  16 18 17   Temp:  99.3 F (37.4 C) 97.6 F (36.4 C) 98.3 F (36.8 C)  TempSrc:  Oral Axillary Oral  SpO2:  99% 98% 98%  Weight: 86 kg       Intake/Output Summary (Last 24 hours) at 02/22/2021 1254 Last data filed at 02/22/2021 1155 Gross per 24 hour  Intake 1380 ml  Output --  Net 1380 ml   Filed Weights   02/18/21 2000 02/22/21 0355  Weight: 80.6 kg 86 kg    Examination:  General exam: Overall comfortable, not in distress, confused HEENT: PERRL Respiratory system:  no wheezes or crackles  Cardiovascular system: S1 & S2 heard, RRR.  Gastrointestinal system: Abdomen is nondistended, soft and nontender. Central nervous system: Alert and awake but not oriented Extremities: No edema, no clubbing ,no cyanosis Skin: No rashes, no ulcers,no icterus     Data Reviewed: I have personally reviewed following labs and imaging studies  CBC: Recent Labs  Lab 02/17/21 1843 02/18/21 0319 02/19/21 0140 02/20/21 0105  WBC 3.0* 2.4* 1.9* 2.9*  NEUTROABS 2.2  --  1.0* 1.7  HGB 8.6* 8.2* 7.9* 8.1*  HCT 27.4* 26.1* 25.1* 24.9*  MCV 104.6* 104.8* 103.7* 102.0*  PLT 62* 60* 73* 78*   Basic Metabolic Panel: Recent Labs  Lab 02/17/21 1843 02/18/21 0319 02/19/21 0140 02/20/21 0105  NA 144 143 141 138  K 4.3 4.0 3.5 3.8  CL 118* 118* 114* 115*  CO2 18* 20* 19* 20*  GLUCOSE 89 169* 116* 114*  BUN 17 15 12 10   CREATININE 1.38* 1.38* 1.02 0.96  CALCIUM 8.5* 8.5* 8.0* 8.0*   GFR: Estimated Creatinine Clearance: 71.1 mL/min (by C-G formula based on SCr of 0.96 mg/dL). Liver Function Tests: No results for input(s): AST, ALT, ALKPHOS, BILITOT, PROT, ALBUMIN in the last 168 hours. No results for input(s): LIPASE, AMYLASE in the last 168 hours. Recent Labs  Lab  02/18/21 0319 02/19/21 0140 02/20/21 0105 02/21/21 0157  AMMONIA 69* 50* 78* 57*   Coagulation Profile: No results for input(s): INR, PROTIME in the last 168 hours. Cardiac Enzymes: No results for input(s): CKTOTAL, CKMB, CKMBINDEX, TROPONINI in the last 168 hours. BNP (last 3 results) No results for input(s): PROBNP in the last 8760 hours. HbA1C: No results for input(s): HGBA1C in the last 72 hours. CBG: Recent Labs  Lab 02/21/21 1204 02/21/21 1633 02/21/21 2119 02/22/21 0759 02/22/21 1210  GLUCAP 150* 118* 150* 146* 178*   Lipid Profile: No results for input(s): CHOL, HDL, LDLCALC, TRIG, CHOLHDL, LDLDIRECT in the last 72 hours. Thyroid Function Tests: No results for input(s): TSH, T4TOTAL, FREET4, T3FREE, THYROIDAB in the last 72 hours. Anemia Panel: No results for input(s): VITAMINB12, FOLATE, FERRITIN, TIBC, IRON, RETICCTPCT in the last 72 hours.  Sepsis Labs: No results for input(s): PROCALCITON, LATICACIDVEN in the last 168 hours.  No results found for this or any previous visit (from the past 240 hour(s)).       Radiology Studies: No  results found.      Scheduled Meds:  sodium chloride   Intravenous Once   citalopram  10 mg Oral Daily   donepezil  10 mg Oral QHS   feeding supplement (GLUCERNA SHAKE)  237 mL Oral TID BM   ferrous sulfate  325 mg Oral TID WC   insulin aspart  0-6 Units Subcutaneous TID WC   lactulose  30 g Oral TID   memantine  5 mg Oral BID   multivitamin with minerals  1 tablet Oral Daily   pravastatin  20 mg Oral QHS   QUEtiapine  50 mg Oral QHS   Continuous Infusions:   LOS: 0 days    Time spent: 25 mins.More than 50% of that time was spent in counseling and/or coordination of care.      Shelly Coss, MD [facility] P7/01/2021, 12:54 PM

## 2021-02-22 NOTE — Progress Notes (Signed)
CSW was notified by [facility] that per APS, they will assist in paying privately for memory care placement for patient. This information is not to be shared with the patient's family at this time.   CSW faxed patient's clinicals to [facility] at [facility] for review.  Madilyn Fireman, MSW, LCSW Transitions of Care  Clinical Social Worker II [phone]

## 2021-02-22 NOTE — Progress Notes (Signed)
Occupational Therapy Treatment Patient Details Name: [name] MRN: [id number] DOB: Feb 20, 1946 Today's Date: 02/22/2021    History of present illness Pt is a 75 y.o. male admitted 7/1 for dehydration. He presented to the ED from home after wondering around outside all day and family unable to get him to come back inside. PMH significant for COVID+ 02/07/21, hepatitis B, cirrhosis, hepatocellular carcinoma, pancytopenia, type 2 diabetes mellitus, sleep apnea with CPAP intolerance, and dementia. Pt for IVC.   OT comments  Pt progressing towards acute OT goals. Completed OOB ADL activities at supervision - min guard level. Mentation/behaviors seem improved today; pleasantly confused. Fatigued at end of session requesting to return to bed after sitting in the recliner a few minutes. Full session details below. D/c plan remains appropriate.    Follow Up Recommendations  SNF    Equipment Recommendations  Other (comment) (defer to next venue)    Recommendations for Other Services      Precautions / Restrictions Precautions Precautions: Fall;Other (comment) Precaution Comments: can be combative Restrictions Weight Bearing Restrictions: No       Mobility Bed Mobility Overal bed mobility: Needs Assistance Bed Mobility: Supine to Sit;Sit to Supine     Supine to sit: Supervision Sit to supine: Supervision   General bed mobility comments: extra time. supervision for safefty and setup.    Transfers Overall transfer level: Needs assistance Equipment used: None Transfers: Sit to/from Stand Sit to Stand: Min guard         General transfer comment: to/from EOB and recliner. min guard for safety.    Balance Overall balance assessment: Needs assistance Sitting-balance support: No upper extremity supported;Feet supported Sitting balance-Leahy Scale: Normal     Standing balance support: No upper extremity supported;During functional activity Standing balance-Leahy Scale:  Fair                             ADL either performed or assessed with clinical judgement   ADL Overall ADL's : Needs assistance/impaired     Grooming: Min guard;Wash/dry hands;Standing;Supervision/safety                   Armed forces technical officer: Ambulation;Supervision/safety           Functional mobility during ADLs: Min guard General ADL Comments: Pt completed toilet transfer, grooming task standing at sink and walked bathroom distances during session. Seeks intermittent single UE support while walking and in standing.     Vision       Perception     Praxis      Cognition Arousal/Alertness: Awake/alert (fatigue) Behavior During Therapy: Flat affect Overall Cognitive Status: No family/caregiver present to determine baseline cognitive functioning                                 General Comments: pleasantly confused. able to engage in some back and forth conversation. dementia at baseline.        Exercises     Shoulder Instructions       General Comments      Pertinent Vitals/ Pain       Pain Assessment: Faces Faces Pain Scale: No hurt Pain Intervention(s): Monitored during session  Home Living  Prior Functioning/Environment              Frequency  Min 2X/week        Progress Toward Goals  OT Goals(current goals can now be found in the care plan section)  Progress towards OT goals: Progressing toward goals  Acute Rehab OT Goals Patient Stated Goal: not stated OT Goal Formulation: Patient unable to participate in goal setting Time For Goal Achievement: 03/05/21 Potential to Achieve Goals: Good ADL Goals Additional ADL Goal #1: Pt will increase to modified independence of OOB ADL with <2 cues for sequencing. Additional ADL Goal #2: Pt will perform sustained attention task i.e. coloring, drawing, word find with minimal cues to attend to task x5 mins to  increase activity tolerance.  Plan Discharge plan remains appropriate    Co-evaluation                 AM-PAC OT "6 Clicks" Daily Activity     Outcome Measure   Help from another person eating meals?: None Help from another person taking care of personal grooming?: A Little Help from another person toileting, which includes using toliet, bedpan, or urinal?: A Little Help from another person bathing (including washing, rinsing, drying)?: A Little Help from another person to put on and taking off regular upper body clothing?: None Help from another person to put on and taking off regular lower body clothing?: A Little 6 Click Score: 20    End of Session    OT Visit Diagnosis: Unsteadiness on feet (R26.81);Muscle weakness (generalized) (M62.81)   Activity Tolerance Patient tolerated treatment well;Patient limited by fatigue (Encouraged pt to sit up in recliner a bit but pt ultimately wanted back to bed 2/2 feeling tired.)   Patient Left in bed;with call bell/phone within reach;with nursing/sitter in room   Nurse Communication          Time: 0569-7948 OT Time Calculation (min): 15 min  Charges: OT General Charges $OT Visit: 1 Visit OT Treatments $Self Care/Home Management : 8-22 mins  Tyrone Schimke, Mildred Pager: [phone] Office: [phone]    Hortencia Pilar 02/22/2021, 1:51 PM

## 2021-02-22 NOTE — Progress Notes (Signed)
Physical Therapy Treatment Patient Details Name: [name] MRN: [id number] DOB: [birth year] Today's Date: 02/22/2021    History of Present Illness Pt is a 75 y.o. male admitted 7/1 for dehydration. He presented to the ED from home after wondering around outside all day and family unable to get him to come back inside. PMH significant for COVID+ 02/07/21, hepatitis B, cirrhosis, hepatocellular carcinoma, pancytopenia, type 2 diabetes mellitus, sleep apnea with CPAP intolerance, and dementia. Pt for IVC.    PT Comments    Patient received in bed asleep but pleasant, participatory in PT session with some coaxing. Continues to tolerate gait well, does have slightly antalgic pattern but very poor describer/historian and tells me "I don't know" when asked if his leg is bothering him. Easily distracted but redirectable, slightly impulsive. Declines up to chair. Left in bed with all needs met, safety sitter present. Will continue to follow but really would be best served by custodial care f/u after DC.     Follow Up Recommendations  SNF;Supervision/Assistance - 24 hour     Equipment Recommendations  None recommended by PT    Recommendations for Other Services       Precautions / Restrictions Precautions Precautions: Fall;Other (comment) Precaution Comments: can be combative Restrictions Weight Bearing Restrictions: No    Mobility  Bed Mobility Overal bed mobility: Needs Assistance Bed Mobility: Supine to Sit;Sit to Supine     Supine to sit: Supervision Sit to supine: Supervision   General bed mobility comments: supervision for safety/increased time    Transfers Overall transfer level: Needs assistance Equipment used: None Transfers: Sit to/from Stand Sit to Stand: Min guard         General transfer comment: min guard for safety, cues for sequencing/safety; easily distractible  Ambulation/Gait Ambulation/Gait assistance: Min guard Gait Distance (Feet): 250  Feet Assistive device: None Gait Pattern/deviations: Step-through pattern;Decreased stride length;Drifts right/left;Decreased weight shift to left Gait velocity: decreased   General Gait Details: min guard for safety, tends to vary gait speed while walking- became much faster when he was walking back to his room. Easily distracted but redirectable. Tends to walk like one leg is stiff and/or sore but not a reliable historian/describer of symptoms   Stairs             Wheelchair Mobility    Modified Rankin (Stroke Patients Only)       Balance Overall balance assessment: Needs assistance Sitting-balance support: No upper extremity supported;Feet supported Sitting balance-Leahy Scale: Normal     Standing balance support: No upper extremity supported;During functional activity Standing balance-Leahy Scale: Fair                              Cognition Arousal/Alertness: Awake/alert Behavior During Therapy: Flat affect Overall Cognitive Status: No family/caregiver present to determine baseline cognitive functioning                                 General Comments: dementia at baseline, no retention of education/information. Still easily distracted but much more easily redirected today      Exercises      General Comments        Pertinent Vitals/Pain Pain Assessment: Faces Faces Pain Scale: No hurt Pain Intervention(s): Limited activity within patient's tolerance;Monitored during session    Home Living  Prior Function            PT Goals (current goals can now be found in the care plan section) Acute Rehab PT Goals Patient Stated Goal: not stated PT Goal Formulation: Patient unable to participate in goal setting Time For Goal Achievement: 03/05/21 Potential to Achieve Goals: Fair Progress towards PT goals: Progressing toward goals    Frequency    Min 2X/week      PT Plan Current plan remains  appropriate    Co-evaluation              AM-PAC PT "6 Clicks" Mobility   Outcome Measure  Help needed turning from your back to your side while in a flat bed without using bedrails?: None Help needed moving from lying on your back to sitting on the side of a flat bed without using bedrails?: None Help needed moving to and from a bed to a chair (including a wheelchair)?: A Little Help needed standing up from a chair using your arms (e.g., wheelchair or bedside chair)?: A Little Help needed to walk in hospital room?: A Little Help needed climbing 3-5 steps with a railing? : A Little 6 Click Score: 20    End of Session Equipment Utilized During Treatment: Gait belt Activity Tolerance: Patient tolerated treatment well Patient left: in bed;with call bell/phone within reach;with nursing/sitter in room Nurse Communication: Mobility status PT Visit Diagnosis: Unsteadiness on feet (R26.81)     Time: 9678-9381 PT Time Calculation (min) (ACUTE ONLY): 11 min  Charges:  $Gait Training: 8-22 mins                    Windell Norfolk, DPT, PN1   Supplemental Physical Therapist [place]    Pager [phone] Acute Rehab Office [phone]

## 2021-02-23 DIAGNOSIS — E440 Moderate protein-calorie malnutrition: Secondary | ICD-10-CM

## 2021-02-23 DIAGNOSIS — D61818 Other pancytopenia: Secondary | ICD-10-CM | POA: Diagnosis not present

## 2021-02-23 DIAGNOSIS — E860 Dehydration: Secondary | ICD-10-CM | POA: Diagnosis not present

## 2021-02-23 DIAGNOSIS — F0390 Unspecified dementia without behavioral disturbance: Secondary | ICD-10-CM | POA: Diagnosis not present

## 2021-02-23 LAB — GLUCOSE, CAPILLARY
Glucose-Capillary: 102 mg/dL — ABNORMAL HIGH (ref 70–99)
Glucose-Capillary: 104 mg/dL — ABNORMAL HIGH (ref 70–99)
Glucose-Capillary: 165 mg/dL — ABNORMAL HIGH (ref 70–99)
Glucose-Capillary: 141 mg/dL — ABNORMAL HIGH (ref 70–99)
Glucose-Capillary: 135 mg/dL — ABNORMAL HIGH (ref 70–99)

## 2021-02-23 NOTE — Progress Notes (Signed)
[facility] PROGRESS NOTE  [name] LFY:[id number] DOB: [birth year] DOA: 02/17/2021 PCP: Wendie Agreste, MD  Status: The patient remains OBS appropriate and will d/c before 2 midnights. Admitted with preadmission IVC in place.  No longer meets requirements for IVC so we will let expire  Dispo: The patient is from: Home              Anticipated d/c is to: ALF with memory care              Patient currently is medically stable to d/c.  Difficult to place patient Yes   Level of care: Med-Surg  Code Status: Full Family Communication:  DVT prophylaxis: SCD COVID vaccination status: Pfizer 10/03/2019, 10/28/2019, and booster 06/28/2020  HPI: 75 year old male with history of hepatitis B, cirrhosis, hepatocellular carcinoma, pancytopenia, diabetes, sleep apnea with CPAP intolerance, dementia who presents from home after he was found to be wandering outside and refusing to go back to home.  Patient was unable to contribute any history due to his dementia, disorientation.  On presentation he was tachycardic in the range of 130s, normalized with IV fluids.  As per the family, he he has been recently combative, wandering here and there and they have been working with a Education officer, museum with plan for nursing facility placement.  On presentation, lab work showed chronic pancytopenia, hemoglobin A1c of 8.6.  Patient was admitted for further management and unsafe discharge plan. Patient was started on IV fluids for mild AKI.  Lab work showed pancytopenia, we have ordered vitamin E52 and folic acid level.  Hemoglobin is currently stable.  His ammonia level was found to be high which could be associated with his history of cirrhosis, hepatitis B/HCC,started on lactulose.  He was IVCd by his son.  APS involved prior to admission and are assisting with discharge disposition.  He does not have any skilled needs therefore we are looking to place patient in an ALF memory care capabilities.  Disposition  complicated by lack of funding to pay for ALF and eligibility for Medicaid given patient continues to own his own home  Subjective: Sleeping soundly and difficult to awaken.  Sitter at bedside.  I asked if patient had been awake all night but he stated he did not know.  While on unit unable to locate patient's nurse.  Family did awaken briefly and did not appear to be agitated.  Stated he was tired and wanted to go back to sleep.   Objective: Vitals:   02/23/21 0400 02/23/21 0808  BP: 120/65 122/65  Pulse: 74 73  Resp: 18 17  Temp: 98.5 F (36.9 C) 97.7 F (36.5 C)  SpO2: 97% 96%    Intake/Output Summary (Last 24 hours) at 02/23/2021 0817 Last data filed at 02/22/2021 1426 Gross per 24 hour  Intake 720 ml  Output --  Net 720 ml   Filed Weights   02/18/21 2000 02/22/21 0355  Weight: 80.6 kg 86 kg    Exam:  Constitutional: Sleeping and briefly awakened.  Did not exhibit any agitated behavior while awake Respiratory: Lungs remain clear, stable on room air, no increased work of breathing Cardiovascular: S1-S2, regular pulse, no peripheral edema, normotensive Abdomen: Soft nontender nondistended with normoactive bowel sounds.  Eating 100% of meals, LBM 7/5 Neurologic: Exam limited by patient's fatigue and sleepiness.  He is able to turn himself over in the bed.  Formal exam otherwise not completed. Psychiatric: Sleeping but awakens.  Oriented to self.  Not agitated.  Assessment/Plan: Acute problems:  Dehydration/AKI:  Resolved IV fluid hydration discontinued Creatinine has decreased from 1.38 to 0.96   Dementia with behavioral disturbance:  Brought to ER per family after patient was found wandering outside the home and could not be coaxed to come back into the house Patient noncompliant with his medications at home IVCd in the ER but at this time does not meet requirements for IVC so we will let expire Patient was trying to place an SNF but does not have skilled needs.   Likely will be placed in an ALF with memory care unit PS involved prior to admission and will be assisting with placement concerns given patient owns his home and is currently not eligible for Medicaid Has been continued on home Aricept and Remeron but given low baseline blood pressures will check orthostatic vital signs.  If positive will need to at least discontinue Aricept and consider discontinuing Remeron Started on Seroquel HS this admission with significant improvement in behaviors although in the setting unable to accurately determine if wandering behavior has resolved Has also been evaluated by psychiatry for his anxiety and was subsequently started on citalopram Need to clarify if patient is actually having insomnia   Elevated ammonia level:  Likely secondary to dehydration +/- Contributed to recent behavioral changes   History of hepatitis B/cirrhosis/hepatocellular carcinoma:  Follows with Dr. Learta Codding.   Status post partial left hepatectomy on 02/2013.  Has chronic pancytopenia suboptimal blood pressure secondary to cirrhosis Vitamin B12 and folate levels are normal with hemoglobin stable at 8.1   Type 2 diabetes mellitus:  HgbA1c of 7.3 as of 08/2020.   Continue sliding scale insulin.   CBGs well controlled and less than 200   Suboptimal blood pressure:  Documented by oncology in the outpatient setting this is a chronic issue Potentially influenced by variable oral intake of fluids at home given his underlying dementia  Plan is to check orthostatic vital signs in the event Aricept and/or Remeron need to be discontinued Current supine blood pressures since arrival have been in the 1 15-1 25 range  Moderate protein calorie malnutrition Nutrition Problem: Moderate Malnutrition Etiology: chronic illness (dementia) Signs/Symptoms: mild fat depletion, moderate fat depletion, mild muscle depletion, moderate muscle depletion Interventions: Liberalize Diet, MVI, Glucerna shake,  Magic cup Body mass index is 28 kg/m.      Data Reviewed: Basic Metabolic Panel: Recent Labs  Lab 02/17/21 1843 02/18/21 0319 02/19/21 0140 02/20/21 0105  NA 144 143 141 138  K 4.3 4.0 3.5 3.8  CL 118* 118* 114* 115*  CO2 18* 20* 19* 20*  GLUCOSE 89 169* 116* 114*  BUN 17 15 12 10   CREATININE 1.38* 1.38* 1.02 0.96  CALCIUM 8.5* 8.5* 8.0* 8.0*   Liver Function Tests: No results for input(s): AST, ALT, ALKPHOS, BILITOT, PROT, ALBUMIN in the last 168 hours. No results for input(s): LIPASE, AMYLASE in the last 168 hours. Recent Labs  Lab 02/18/21 0319 02/19/21 0140 02/20/21 0105 02/21/21 0157  AMMONIA 69* 50* 78* 57*   CBC: Recent Labs  Lab 02/17/21 1843 02/18/21 0319 02/19/21 0140 02/20/21 0105  WBC 3.0* 2.4* 1.9* 2.9*  NEUTROABS 2.2  --  1.0* 1.7  HGB 8.6* 8.2* 7.9* 8.1*  HCT 27.4* 26.1* 25.1* 24.9*  MCV 104.6* 104.8* 103.7* 102.0*  PLT 62* 60* 73* 78*   Cardiac Enzymes: No results for input(s): CKTOTAL, CKMB, CKMBINDEX, TROPONINI in the last 168 hours. BNP (last 3 results) No results for input(s): BNP in the last 8760  hours.  ProBNP (last 3 results) No results for input(s): PROBNP in the last 8760 hours.  CBG: Recent Labs  Lab 02/22/21 0759 02/22/21 1210 02/22/21 1602 02/23/21 0723 02/23/21 0809  GLUCAP 146* 178* 142* 102* 104*    No results found for this or any previous visit (from the past 240 hour(s)).   Studies: No results found.  Scheduled Meds:  sodium chloride   Intravenous Once   citalopram  10 mg Oral Daily   donepezil  10 mg Oral QHS   feeding supplement (GLUCERNA SHAKE)  237 mL Oral TID BM   ferrous sulfate  325 mg Oral TID WC   insulin aspart  0-6 Units Subcutaneous TID WC   lactulose  30 g Oral TID   memantine  5 mg Oral BID   multivitamin with minerals  1 tablet Oral Daily   pravastatin  20 mg Oral QHS   QUEtiapine  50 mg Oral QHS   Continuous Infusions:  Principal Problem:   Dementia without behavioral disturbance  (HCC) Active Problems:   OSA (obstructive sleep apnea)   Cancer, hepatocellular (HCC)   Memory impairment of gradual onset   Pancytopenia (HCC)   Psychosis ([place])   Dehydration   Renal insufficiency   Malnutrition of moderate degree   Consultants: Psychiatry while in the ER  Procedures: None  Antibiotics: Anti-infectives (From admission, onward)    None          Time spent: 35 minutes    Erin Hearing ANP  [facility] 7 am - 330 pm/M-F for direct patient care and secure chat Please refer to Amion for contact info 0  days

## 2021-02-23 NOTE — Progress Notes (Signed)
     Referral received for [name] :goals of care discussion. Chart reviewed and updates received. Patient assessed and is unable to engage appropriately in discussions. Alert to self. Sitter.   Patient is pending SNF placement per notation under the care of APS. He is currently under observation.   Advanced directives on file with POA listed as sister [name]. Document also acknowledges patient would not want any life prolonging measures in the setting of severe dementia.   Attempted to contact  [name].  Unable to reach. Voicemail left with contact information given.   Given APS involvement and plan in place. Would recommend outpatient palliative support at discharge. Would need to involve APS worker or gain approval for any further goals of care discussions int the setting of APS.   Please call team line with urgent needs or if further assistance required.   Alda Lea, AGPCNP-BC Palliative Medicine Team  Phone: [phone] Amion: N. [place]   NO CHARGE

## 2021-02-23 NOTE — Progress Notes (Signed)
CSW resent patient's clinicals to [place] at [facility] for review.  Madilyn Fireman, MSW, LCSW Transitions of Care  Clinical Social Worker II [phone]

## 2021-02-23 NOTE — Progress Notes (Addendum)
Nutrition Follow-up  DOCUMENTATION CODES:   Non-severe (moderate) malnutrition in context of chronic illness  INTERVENTION:   -Continue Glucerna Shake po TID, each supplement provides 220 kcal and 10 grams of protein  -Continue Magic cup TID with meals, each supplement provides 290 kcal and 9 grams of protein  -Continue MVI with minerals daily  NUTRITION DIAGNOSIS:   Moderate Malnutrition related to chronic illness (dementia) as evidenced by mild fat depletion, moderate fat depletion, mild muscle depletion, moderate muscle depletion.  Ongoing  GOAL:   Patient will meet greater than or equal to 90% of their needs  Progressing   MONITOR:   PO intake, Supplement acceptance, Labs, Weight trends, Skin, I & O's  REASON FOR ASSESSMENT:   Malnutrition Screening Tool    ASSESSMENT:   [name] is a 75 y.o. male with medical history significant for hepatitis B, cirrhosis, hepatocellular carcinoma, pancytopenia, type 2 diabetes mellitus, sleep apnea with CPAP intolerance, and dementia, now presenting from home where he had been wandering outside all day and refusing to go back inside.  Patient is unable to contribute to the history due to his dementia and disorientation.  Heart rate was reportedly in the 130s with EMS initially and normalized with IV fluids prior to arrival in the ED.  Family reported that the patient has been wandering more recently, occasionally combative, and family has been working with Education officer, museum with plan for likely nursing facility placement.  Reviewed I/O's: +720 ml x 24 hours and +3.5 L since admission  Pt sitting up in bed, eating lunch at time of visit.   Pt remains with good appetite. Noted meal completions 25-100%. Pt is intermittently accepting of Glucerna supplements.   Per TOC notes, pt awaiting SNF placement.  Medications reviewed and include ferrous sulfate and lactulose.  Labs reviewed: CBGS: 102-178 (inpatient orders for glycemic  control are 0-6 units insulin aspart TID with meals).    Diet Order:   Diet Order             Diet regular Room service appropriate? Yes with Assist; Fluid consistency: Thin  Diet effective now                   EDUCATION NEEDS:   Not appropriate for education at this time  Skin:  Skin Assessment: Reviewed RN Assessment  Last BM:  Unknown  Height:   Ht Readings from Last 1 Encounters:  01/30/21 5\' 9"  (1.753 m)    Weight:   Wt Readings from Last 1 Encounters:  02/22/21 86 kg    Ideal Body Weight:  72.7 kg  BMI:  Body mass index is 28 kg/m.  Estimated Nutritional Needs:   Kcal:  2200-2400  Protein:  120-135 grams  Fluid:  > 2 L    [name], RD, LDN, [place] Registered Dietitian II Certified Diabetes Care and Education Specialist Please refer to [facility] for RD and/or RD on-call/weekend/after hours pager

## 2021-02-24 DIAGNOSIS — F0390 Unspecified dementia without behavioral disturbance: Secondary | ICD-10-CM | POA: Diagnosis not present

## 2021-02-24 DIAGNOSIS — E860 Dehydration: Secondary | ICD-10-CM | POA: Diagnosis not present

## 2021-02-24 DIAGNOSIS — E440 Moderate protein-calorie malnutrition: Secondary | ICD-10-CM | POA: Diagnosis not present

## 2021-02-24 DIAGNOSIS — D61818 Other pancytopenia: Secondary | ICD-10-CM | POA: Diagnosis not present

## 2021-02-24 LAB — GLUCOSE, CAPILLARY
Glucose-Capillary: 112 mg/dL — ABNORMAL HIGH (ref 70–99)
Glucose-Capillary: 96 mg/dL (ref 70–99)
Glucose-Capillary: 88 mg/dL (ref 70–99)

## 2021-02-24 NOTE — Progress Notes (Addendum)
CSW spoke with Pamala Hurry at [facility] to provide her with APS worker Breanna's contact information.  Madilyn Fireman, MSW, LCSW Transitions of Care  Clinical Social Worker II [phone]
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
## 2021-02-25 DIAGNOSIS — F0391 Unspecified dementia with behavioral disturbance: Secondary | ICD-10-CM | POA: Diagnosis not present

## 2021-02-25 DIAGNOSIS — E860 Dehydration: Secondary | ICD-10-CM | POA: Diagnosis not present

## 2021-02-25 LAB — CBC
HCT: 27.1 % — ABNORMAL LOW (ref 39.0–52.0)
Hemoglobin: 8.6 g/dL — ABNORMAL LOW (ref 13.0–17.0)
MCH: 32.7 pg (ref 26.0–34.0)
MCHC: 31.7 g/dL (ref 30.0–36.0)
MCV: 103 fL — ABNORMAL HIGH (ref 80.0–100.0)
Platelets: 57 10*3/uL — ABNORMAL LOW (ref 150–400)
RBC: 2.63 MIL/uL — ABNORMAL LOW (ref 4.22–5.81)
RDW: 15.4 % (ref 11.5–15.5)
WBC: 2.3 10*3/uL — ABNORMAL LOW (ref 4.0–10.5)
nRBC: 0 % (ref 0.0–0.2)

## 2021-02-25 LAB — TSH: TSH: 0.696 u[IU]/mL (ref 0.350–4.500)

## 2021-02-25 LAB — AMMONIA: Ammonia: 37 umol/L — ABNORMAL HIGH (ref 9–35)

## 2021-02-25 LAB — MAGNESIUM: Magnesium: 1.7 mg/dL (ref 1.7–2.4)

## 2021-02-25 LAB — BASIC METABOLIC PANEL
Anion gap: 4 — ABNORMAL LOW (ref 5–15)
BUN: 7 mg/dL — ABNORMAL LOW (ref 8–23)
CO2: 24 mmol/L (ref 22–32)
Calcium: 8.1 mg/dL — ABNORMAL LOW (ref 8.9–10.3)
Chloride: 110 mmol/L (ref 98–111)
Creatinine, Ser: 1.08 mg/dL (ref 0.61–1.24)
GFR, Estimated: >60 mL/min (ref >60)
Glucose, Bld: 129 mg/dL — ABNORMAL HIGH (ref 70–99)
Potassium: 4.1 mmol/L (ref 3.5–5.1)
Sodium: 138 mmol/L (ref 135–145)

## 2021-02-25 LAB — GLUCOSE, CAPILLARY
Glucose-Capillary: 87 mg/dL (ref 70–99)
Glucose-Capillary: 171 mg/dL — ABNORMAL HIGH (ref 70–99)
Glucose-Capillary: 119 mg/dL — ABNORMAL HIGH (ref 70–99)
Glucose-Capillary: 114 mg/dL — ABNORMAL HIGH (ref 70–99)

## 2021-02-25 LAB — BRAIN NATRIURETIC PEPTIDE: B Natriuretic Peptide: 89.1 pg/mL (ref 0.0–100.0)

## 2021-02-25 LAB — CORTISOL: Cortisol, Plasma: 8.3 ug/dL
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
## 2021-02-26 DIAGNOSIS — F0390 Unspecified dementia without behavioral disturbance: Secondary | ICD-10-CM | POA: Diagnosis not present

## 2021-02-26 LAB — GLUCOSE, CAPILLARY
Glucose-Capillary: 116 mg/dL — ABNORMAL HIGH (ref 70–99)
Glucose-Capillary: 122 mg/dL — ABNORMAL HIGH (ref 70–99)
Glucose-Capillary: 175 mg/dL — ABNORMAL HIGH (ref 70–99)
Glucose-Capillary: 107 mg/dL — ABNORMAL HIGH (ref 70–99)
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
## 2021-02-27 DIAGNOSIS — E440 Moderate protein-calorie malnutrition: Secondary | ICD-10-CM | POA: Diagnosis not present

## 2021-02-27 DIAGNOSIS — F0390 Unspecified dementia without behavioral disturbance: Secondary | ICD-10-CM | POA: Diagnosis not present

## 2021-02-27 DIAGNOSIS — N289 Disorder of kidney and ureter, unspecified: Secondary | ICD-10-CM | POA: Diagnosis not present

## 2021-02-27 LAB — GLUCOSE, CAPILLARY
Glucose-Capillary: 98 mg/dL (ref 70–99)
Glucose-Capillary: 145 mg/dL — ABNORMAL HIGH (ref 70–99)
Glucose-Capillary: 153 mg/dL — ABNORMAL HIGH (ref 70–99)
Glucose-Capillary: 130 mg/dL — ABNORMAL HIGH (ref 70–99)

## 2021-02-27 NOTE — Progress Notes (Signed)
[facility] PROGRESS NOTE  [name] QIW:[id number] DOB: Nov 16, 1945 DOA: 02/17/2021 PCP: Wendie Agreste, MD  Status: The patient remains OBS appropriate and will d/c before 2 midnights. Admitted with preadmission IVC in place.  No longer meets requirements for IVC so we will let expire  Dispo: The patient is from: Home              Anticipated d/c is to: ALF with memory care              Patient currently is medically stable to d/c.  Difficult to place patient Yes   Level of care: Med-Surg  Code Status: Full Family Communication:  DVT prophylaxis: SCD COVID vaccination status: Pfizer 10/03/2019, 10/28/2019, and booster 06/28/2020  HPI: 75 year old male with history of hepatitis B, cirrhosis, hepatocellular carcinoma, pancytopenia, diabetes, sleep apnea with CPAP intolerance, dementia who presents from home after he was found to be wandering outside and refusing to go back to home.  Patient was unable to contribute any history due to his dementia, disorientation.  On presentation he was tachycardic in the range of 130s, normalized with IV fluids.  As per the family, he he has been recently combative, wandering here and there and they have been working with a Education officer, museum with plan for nursing facility placement.  On presentation, lab work showed chronic pancytopenia, hemoglobin A1c of 8.6.  Patient was admitted for further management and unsafe discharge plan. Patient was started on IV fluids for mild AKI.  Lab work showed pancytopenia, we have ordered vitamin E17 and folic acid level.  Hemoglobin is currently stable.  His ammonia level was found to be high which could be associated with his history of cirrhosis, hepatitis B/HCC,started on lactulose.  He was IVCd by his son.  APS involved prior to admission and are assisting with discharge disposition.  He does not have any skilled needs therefore we are looking to place patient in an ALF memory care capabilities.  Disposition  complicated by lack of funding to pay for ALF and eligibility for Medicaid given patient continues to own his own home  Subjective: Awake.  No specific complaints.  States patient ate 100% of breakfast tray.   Objective: Vitals:   02/27/21 0306 02/27/21 0751  BP: (!) 102/52 (!) 105/55  Pulse: 70 73  Resp: 16 17  Temp: 98.3 F (36.8 C) 98.3 F (36.8 C)  SpO2: 100% 100%    Intake/Output Summary (Last 24 hours) at 02/27/2021 0835 Last data filed at 02/26/2021 1719 Gross per 24 hour  Intake 600 ml  Output --  Net 600 ml   Filed Weights   02/18/21 2000 02/22/21 0355 02/25/21 0705  Weight: 80.6 kg 86 kg 81.3 kg    Exam:  Constitutional: Alert and in no acute distress Respiratory: Lung sounds clear, no increased work of breathing.  Stable on room air Cardiovascular: S1-S2, remains normotensive, no peripheral edema, pulse regular Abdomen:  LBM 7/10, extremities x4 with strength 5/5.  He is able to ambulate without difficulty or gait disturbance. Psychiatric: Oriented to self only, no insight into current dementia diagnosis and impact on ability to remain in the home   Assessment/Plan: Acute problems:  Dehydration/AKI:  Resolved after treatment with IV fluids Creatinine has decreased from 1.38 to 0.96   Dementia with behavioral disturbance:  Brought to ER per family after patient was found wandering outside the home and could not be coaxed to come back into the house Patient noncompliant with his medications at  home No longer meets requirements for IVC so will allow to expire Plan is to discharge to ALF with memory care capabilities APS involved prior to admission and assisting with placement needs Owns home-not eligible for Medicaid Continue Seroquel HS with citalopram   Elevated ammonia level:  Likely secondary to dehydration +/- Contributed to recent behavioral changes   History of hepatitis B/cirrhosis/hepatocellular carcinoma:  Follows with Dr. Learta Codding.   Status  post partial left hepatectomy on 02/2013.  Has chronic pancytopenia suboptimal blood pressure secondary to cirrhosis Vitamin B12 and folate levels are normal with hemoglobin stable at 8.1   Type 2 diabetes mellitus:  HgbA1c of 7.3 as of 08/2020.   Continue sliding scale insulin.   CBGs well controlled and less than 200  Moderate protein calorie malnutrition Nutrition Problem: Moderate Malnutrition Etiology: chronic illness (dementia) Signs/Symptoms: mild fat depletion, moderate fat depletion, mild muscle depletion, moderate muscle depletion Interventions: Liberalize Diet, MVI, Glucerna shake, Magic cup Body mass index is 26.47 kg/m.      Data Reviewed: Basic Metabolic Panel: Recent Labs  Lab 02/25/21 0749  NA 138  K 4.1  CL 110  CO2 24  GLUCOSE 129*  BUN 7*  CREATININE 1.08  CALCIUM 8.1*  MG 1.7   Liver Function Tests: No results for input(s): AST, ALT, ALKPHOS, BILITOT, PROT, ALBUMIN in the last 168 hours. No results for input(s): LIPASE, AMYLASE in the last 168 hours. Recent Labs  Lab 02/21/21 0157 02/25/21 0749  AMMONIA 57* 37*   CBC: Recent Labs  Lab 02/25/21 0749  WBC 2.3*  HGB 8.6*  HCT 27.1*  MCV 103.0*  PLT 57*   Cardiac Enzymes: No results for input(s): CKTOTAL, CKMB, CKMBINDEX, TROPONINI in the last 168 hours. BNP (last 3 results) Recent Labs    02/25/21 0749  BNP 89.1    ProBNP (last 3 results) No results for input(s): PROBNP in the last 8760 hours.  CBG: Recent Labs  Lab 02/26/21 0758 02/26/21 1205 02/26/21 1620 02/26/21 2046 02/27/21 0746  GLUCAP 116* 122* 175* 107* 98    No results found for this or any previous visit (from the past 240 hour(s)).   Studies: No results found.  Scheduled Meds:  sodium chloride   Intravenous Once   citalopram  10 mg Oral Daily   donepezil  10 mg Oral QHS   feeding supplement (GLUCERNA SHAKE)  237 mL Oral TID BM   ferrous sulfate  325 mg Oral TID WC   insulin aspart  0-6 Units  Subcutaneous TID WC   lactulose  30 g Oral TID   memantine  5 mg Oral BID   multivitamin with minerals  1 tablet Oral Daily   pravastatin  20 mg Oral QHS   QUEtiapine  50 mg Oral QHS   Continuous Infusions:  Principal Problem:   Dementia without behavioral disturbance (HCC) Active Problems:   OSA (obstructive sleep apnea)   Cancer, hepatocellular (HCC)   Memory impairment of gradual onset   Pancytopenia (HCC)   Psychosis ([place])   Dehydration   Renal insufficiency   Malnutrition of moderate degree   Consultants: Psychiatry while in the ER  Procedures: None  Antibiotics: Anti-infectives (From admission, onward)    None          Time spent: 25 minutes    Erin Hearing ANP  [facility] 7 am - 330 pm/M-F for direct patient care and secure chat Please refer to Amion for contact info 0  days

## 2021-02-27 NOTE — Progress Notes (Signed)
PT Cancellation Note  Patient Details Name: [name] MRN: [id number] DOB: 1946/04/06   Cancelled Treatment:    Reason Eval/Treat Not Completed: Patient declined, no reason specified refuses getting OOB with PT today, keeps telling me he is too tired. Will continue efforts.   Windell Norfolk, DPT, PN1   Supplemental Physical Therapist [facility] [place]    Pager [phone] Acute Rehab Office [phone]

## 2021-02-28 DIAGNOSIS — E860 Dehydration: Secondary | ICD-10-CM | POA: Diagnosis not present

## 2021-02-28 DIAGNOSIS — F0390 Unspecified dementia without behavioral disturbance: Secondary | ICD-10-CM | POA: Diagnosis not present

## 2021-02-28 LAB — CBC WITH DIFFERENTIAL/PLATELET
Abs Immature Granulocytes: 0 10*3/uL (ref 0.00–0.07)
Basophils Absolute: 0 10*3/uL (ref 0.0–0.1)
Basophils Relative: 0 %
Eosinophils Absolute: 0 10*3/uL (ref 0.0–0.5)
Eosinophils Relative: 1 %
HCT: 30 % — ABNORMAL LOW (ref 39.0–52.0)
Hemoglobin: 9.3 g/dL — ABNORMAL LOW (ref 13.0–17.0)
Immature Granulocytes: 0 %
Lymphocytes Relative: 20 %
Lymphs Abs: 0.5 10*3/uL — ABNORMAL LOW (ref 0.7–4.0)
MCH: 32.5 pg (ref 26.0–34.0)
MCHC: 31 g/dL (ref 30.0–36.0)
MCV: 104.9 fL — ABNORMAL HIGH (ref 80.0–100.0)
Monocytes Absolute: 0.4 10*3/uL (ref 0.1–1.0)
Monocytes Relative: 15 %
Neutro Abs: 1.5 10*3/uL — ABNORMAL LOW (ref 1.7–7.7)
Neutrophils Relative %: 64 %
Platelets: 54 10*3/uL — ABNORMAL LOW (ref 150–400)
RBC: 2.86 MIL/uL — ABNORMAL LOW (ref 4.22–5.81)
RDW: 15.3 % (ref 11.5–15.5)
WBC: 2.4 10*3/uL — ABNORMAL LOW (ref 4.0–10.5)
nRBC: 0 % (ref 0.0–0.2)

## 2021-02-28 LAB — GLUCOSE, CAPILLARY
Glucose-Capillary: 83 mg/dL (ref 70–99)
Glucose-Capillary: 177 mg/dL — ABNORMAL HIGH (ref 70–99)
Glucose-Capillary: 127 mg/dL — ABNORMAL HIGH (ref 70–99)
Glucose-Capillary: 147 mg/dL — ABNORMAL HIGH (ref 70–99)

## 2021-02-28 NOTE — Progress Notes (Signed)
[facility] PROGRESS NOTE  [name] HYW:[id number] DOB: [birth year] DOA: 02/17/2021 PCP: Wendie Agreste, MD  Status: The patient remains OBS appropriate and will d/c before 2 midnights. Admitted with preadmission IVC in place.  No longer meets requirements for IVC so we will let expire  Dispo: The patient is from: Home              Anticipated d/c is to: ALF with memory care              Patient currently is medically stable to d/c.  Difficult to place patient Yes   Level of care: Med-Surg  Code Status: Full Family Communication:  DVT prophylaxis: SCD COVID vaccination status: Pfizer 10/03/2019, 10/28/2019, and booster 06/28/2020  HPI: 75 year old male with history of hepatitis B, cirrhosis, hepatocellular carcinoma, pancytopenia, diabetes, sleep apnea with CPAP intolerance, dementia who presents from home after he was found to be wandering outside and refusing to go back to home.  Patient was unable to contribute any history due to his dementia, disorientation.  On presentation he was tachycardic in the range of 130s, normalized with IV fluids.  As per the family, he he has been recently combative, wandering here and there and they have been working with a Education officer, museum with plan for nursing facility placement.  On presentation, lab work showed chronic pancytopenia, hemoglobin A1c of 8.6.  Patient was admitted for further management and unsafe discharge plan. Patient was started on IV fluids for mild AKI.  Lab work showed pancytopenia, we have ordered vitamin S85 and folic acid level.  Hemoglobin is currently stable.  His ammonia level was found to be high which could be associated with his history of cirrhosis, hepatitis B/HCC,started on lactulose.  He was IVCd by his son.  APS involved prior to admission and are assisting with discharge disposition.  He does not have any skilled needs therefore we are looking to place patient in an ALF memory care capabilities.  Disposition  complicated by lack of funding to pay for ALF and eligibility for Medicaid given patient continues to own his own home  Subjective: Alert.  No specific complaints although does have questions as to when he will be able to discharge home.   Objective: Vitals:   02/28/21 0436 02/28/21 0730  BP: (!) 95/51 (!) 103/55  Pulse: 64 68  Resp: 15 15  Temp: 98.7 F (37.1 C) 98.4 F (36.9 C)  SpO2: 100% 99%    Intake/Output Summary (Last 24 hours) at 02/28/2021 0845 Last data filed at 02/27/2021 1244 Gross per 24 hour  Intake 480 ml  Output --  Net 480 ml   Filed Weights   02/18/21 2000 02/22/21 0355 02/25/21 0705  Weight: 80.6 kg 86 kg 81.3 kg    Exam:  Constitutional: Awake, currently pleasant and in no apparent distress Respiratory: Lungs remain clear on posterior exam, stable on room air without any increased work of breathing Cardiovascular: Normal heart sounds, normotensive, regular pulse Abdomen:  LBM 7/11, soft nontender nondistended.  Irritable oral intake noted eating between 25% and 100% of meal Neurological: Cranial nerves intact except for he is hearing loss.  Ambulates without difficulty or gait disturbance. Psychiatric: Alert, oriented to self, place and year.  Continues to lack insight into changes related to mentation and how this affects his ability to remain in his home   Assessment/Plan: Acute problems:  Dehydration/AKI:  Resolved after treatment with IV fluids Creatinine has decreased from 1.38 to 0.96   Dementia  with behavioral disturbance:  Improved with the addition of Seroquel and citalopram this admission No longer meets requirements for IVC so will allow to expire Plan is to discharge to ALF with memory care capabilities APS involved prior to admission and assisting with placement needs Owns home-not eligible for Medicaid   Elevated ammonia level:  Likely secondary to dehydration +/- Contributed to recent behavioral changes   History of hepatitis  B/cirrhosis/hepatocellular carcinoma:  Follows with Dr. Learta Codding.   Status post partial left hepatectomy on 02/2013.  Has chronic pancytopenia suboptimal blood pressure secondary to cirrhosis Vitamin B12 and folate levels are normal with hemoglobin stable at 8.1   Type 2 diabetes mellitus:  HgbA1c of 7.3 as of 08/2020.   Continue sliding scale insulin.   CBGs well controlled and less than 200  Moderate protein calorie malnutrition Nutrition Problem: Moderate Malnutrition Etiology: chronic illness (dementia) Signs/Symptoms: mild fat depletion, moderate fat depletion, mild muscle depletion, moderate muscle depletion Interventions: Liberalize Diet, MVI, Glucerna shake, Magic cup Body mass index is 26.47 kg/m.      Data Reviewed: Basic Metabolic Panel: Recent Labs  Lab 02/25/21 0749  NA 138  K 4.1  CL 110  CO2 24  GLUCOSE 129*  BUN 7*  CREATININE 1.08  CALCIUM 8.1*  MG 1.7   Liver Function Tests: No results for input(s): AST, ALT, ALKPHOS, BILITOT, PROT, ALBUMIN in the last 168 hours. No results for input(s): LIPASE, AMYLASE in the last 168 hours. Recent Labs  Lab 02/25/21 0749  AMMONIA 37*   CBC: Recent Labs  Lab 02/25/21 0749  WBC 2.3*  HGB 8.6*  HCT 27.1*  MCV 103.0*  PLT 57*   Cardiac Enzymes: No results for input(s): CKTOTAL, CKMB, CKMBINDEX, TROPONINI in the last 168 hours. BNP (last 3 results) Recent Labs    02/25/21 0749  BNP 89.1    ProBNP (last 3 results) No results for input(s): PROBNP in the last 8760 hours.  CBG: Recent Labs  Lab 02/27/21 0746 02/27/21 1149 02/27/21 1606 02/27/21 2012 02/28/21 0729  GLUCAP 98 145* 153* 130* 83    No results found for this or any previous visit (from the past 240 hour(s)).   Studies: No results found.  Scheduled Meds:  sodium chloride   Intravenous Once   citalopram  10 mg Oral Daily   donepezil  10 mg Oral QHS   feeding supplement (GLUCERNA SHAKE)  237 mL Oral TID BM   ferrous sulfate  325  mg Oral TID WC   insulin aspart  0-6 Units Subcutaneous TID WC   lactulose  30 g Oral TID   memantine  5 mg Oral BID   multivitamin with minerals  1 tablet Oral Daily   pravastatin  20 mg Oral QHS   QUEtiapine  50 mg Oral QHS   Continuous Infusions:  Principal Problem:   Dementia without behavioral disturbance (HCC) Active Problems:   OSA (obstructive sleep apnea)   Cancer, hepatocellular (HCC)   Memory impairment of gradual onset   Pancytopenia (HCC)   Psychosis ([place])   Dehydration   Renal insufficiency   Malnutrition of moderate degree   Consultants: Psychiatry while in the ER  Procedures: None  Antibiotics: Anti-infectives (From admission, onward)    None          Time spent: 15 minutes    Erin Hearing ANP  [facility] 7 am - 330 pm/M-F for direct patient care and secure chat Please refer to Amion for contact info 0  days

## 2021-02-28 NOTE — Progress Notes (Signed)
Occupational Therapy Treatment Patient Details Name: [name] MRN: [id number] DOB: Feb 09, 1946 Today's Date: 02/28/2021    History of present illness Pt is a 75 y.o. male admitted 7/1 for dehydration. He presented to the ED from home after wondering around outside all day and family unable to get him to come back inside. PMH significant for COVID+ 02/07/21, hepatitis B, cirrhosis, hepatocellular carcinoma, pancytopenia, type 2 diabetes mellitus, sleep apnea with CPAP intolerance, and dementia. Pt for IVC.   OT comments  Pt received in bed, agreeable to OT session. Pt requires minguard for functional mobility due to instability. Pt had 1x loss of balance requiring assistance from therapist to correct. He completed grooming while standing at sink level with minguard for safety. Pt required assistance for combing the back of his head. Pt left in recliner with chair alarm activated. Pt demonstrated what button to press if he needed his nurse. Pt will continue to benefit from skilled OT services to maximize safety and independence with ADL/IADL and functional mobility. Will continue to follow acutely and progress as tolerated.    Follow Up Recommendations  SNF    Equipment Recommendations  Other (comment) (defer to next venue)    Recommendations for Other Services      Precautions / Restrictions Precautions Precautions: Fall;Other (comment) Precaution Comments: can be combative Restrictions Weight Bearing Restrictions: No       Mobility Bed Mobility Overal bed mobility: Needs Assistance Bed Mobility: Supine to Sit     Supine to sit: Supervision     General bed mobility comments: supervision for safety    Transfers Overall transfer level: Needs assistance Equipment used: None Transfers: Sit to/from Stand Sit to Stand: Min guard         General transfer comment: minguard for safety, instability noted with initial stand, pt able to self correct    Balance Overall  balance assessment: Needs assistance Sitting-balance support: No upper extremity supported;Feet supported Sitting balance-Leahy Scale: Normal     Standing balance support: No upper extremity supported;During functional activity;Single extremity supported Standing balance-Leahy Scale: Fair Standing balance comment: demonstrated preference for single UE support                           ADL either performed or assessed with clinical judgement   ADL Overall ADL's : Needs assistance/impaired     Grooming: Min guard;Standing;Brushing hair;Wash/dry face;Wash/dry hands;Oral care Grooming Details (indicate cue type and reason): completed while standing at sink level             Lower Body Dressing: Min guard;Sit to/from stand   Toilet Transfer: Min guard;Ambulation Toilet Transfer Details (indicate cue type and reason): 1x minor loss of balance, required assistance to correct, pt with delayed righting reaction         Functional mobility during ADLs: Min guard General ADL Comments: pt limited by instability, cognition, decreased activity tolerance. max cues to use cane, would benefit from use as he demonstrates preference for at least single UE support     Vision   Vision Assessment?: No apparent visual deficits   Perception     Praxis      Cognition Arousal/Alertness: Awake/alert Behavior During Therapy: Flat affect Overall Cognitive Status: No family/caregiver present to determine baseline cognitive functioning                                 General  Comments: pleasantly confused. able to engage in some back and forth conversation. dementia at baseline.        Exercises     Shoulder Instructions       General Comments vss on RA    Pertinent Vitals/ Pain       Pain Assessment: No/denies pain  Home Living                                          Prior Functioning/Environment              Frequency  Min  2X/week        Progress Toward Goals  OT Goals(current goals can now be found in the care plan section)  Progress towards OT goals: Progressing toward goals  Acute Rehab OT Goals Patient Stated Goal: not stated OT Goal Formulation: Patient unable to participate in goal setting Time For Goal Achievement: 03/05/21 Potential to Achieve Goals: Good ADL Goals Additional ADL Goal #1: Pt will increase to modified independence of OOB ADL with <2 cues for sequencing. Additional ADL Goal #2: Pt will perform sustained attention task i.e. coloring, drawing, word find with minimal cues to attend to task x5 mins to increase activity tolerance.  Plan Discharge plan remains appropriate    Co-evaluation                 AM-PAC OT "6 Clicks" Daily Activity     Outcome Measure   Help from another person eating meals?: None Help from another person taking care of personal grooming?: A Little Help from another person toileting, which includes using toliet, bedpan, or urinal?: A Little Help from another person bathing (including washing, rinsing, drying)?: A Little Help from another person to put on and taking off regular upper body clothing?: None Help from another person to put on and taking off regular lower body clothing?: A Little 6 Click Score: 20    End of Session Equipment Utilized During Treatment: Gait belt  OT Visit Diagnosis: Unsteadiness on feet (R26.81);Muscle weakness (generalized) (M62.81)   Activity Tolerance Patient tolerated treatment well;Patient limited by fatigue (Encouraged pt to sit up in recliner a bit but pt ultimately wanted back to bed 2/2 feeling tired.)   Patient Left with call bell/phone within reach;in chair;with chair alarm set   Nurse Communication Mobility status        Time: 4462-8638 OT Time Calculation (min): 12 min  Charges: OT General Charges $OT Visit: 1 Visit OT Treatments $Self Care/Home Management : 8-22 mins  Helene Kelp OTR/L Acute  Rehabilitation Services Office: [place] 02/28/2021, 12:07 PM

## 2021-02-28 NOTE — Progress Notes (Signed)
Pt with a hx of hep B and hepatic CA. Ok to get hepatitis B titers while here. Hep B RNA, HbeAg, HbeAb, Hbsag, Hbsab per Erin Hearing.  Onnie Boer, PharmD, BCIDP, AAHIVP, CPP Infectious Disease Pharmacist 02/28/2021 2:29 PM

## 2021-02-28 NOTE — Progress Notes (Signed)
CSW spoke with Pamala Hurry at [facility] who states she has provided APS worker Designer, industrial/product with an Pharmacologist with facility pricing.  CSW attempted to reach [place] at [facility] without success - a voicemail was left requesting a return call.  Madilyn Fireman, MSW, LCSW Transitions of Care  Clinical Social Worker II [phone]

## 2021-02-28 NOTE — Progress Notes (Signed)
Physical Therapy Treatment Patient Details Name: [name] MRN: [id number] DOB: 1946-01-17 Today's Date: 02/28/2021    History of Present Illness Pt is a 75 y.o. male admitted 7/1 for dehydration. He presented to the ED from home after wondering around outside all day and family unable to get him to come back inside. PMH significant for COVID+ 02/07/21, hepatitis B, cirrhosis, hepatocellular carcinoma, pancytopenia, type 2 diabetes mellitus, sleep apnea with CPAP intolerance, and dementia. Pt for IVC.    PT Comments    Patient received in recliner, pleasantly confused but cooperative. Does seem a bit HOH. Able to progress mobility at a min guard level with gait pattern and stability much improved with use of SPC. Able to toilet and perform pericare with general supervision. Left up in recliner with all needs met, chair alarm active. Will continue to follow.     Follow Up Recommendations  SNF;Supervision/Assistance - 24 hour     Equipment Recommendations  None recommended by PT    Recommendations for Other Services       Precautions / Restrictions Precautions Precautions: Fall;Other (comment) Precaution Comments: can be combative Restrictions Weight Bearing Restrictions: No    Mobility  Bed Mobility Overal bed mobility: Needs Assistance Bed Mobility: Supine to Sit     Supine to sit: Supervision     General bed mobility comments: up in recliner    Transfers Overall transfer level: Needs assistance Equipment used: None Transfers: Sit to/from Stand Sit to Stand: Min guard         General transfer comment: minguard for safety, instability noted with initial stand, pt able to self correct  Ambulation/Gait Ambulation/Gait assistance: Min guard Gait Distance (Feet): 275 Feet Assistive device: Straight cane Gait Pattern/deviations: Step-through pattern;Decreased stride length;Drifts right/left;Decreased weight shift to left Gait velocity: decreased   General  Gait Details: did much better today with use of SPC for safety- had a much more steady pattern. Easily distracted but reports he is happy to get out of his room.   Stairs             Wheelchair Mobility    Modified Rankin (Stroke Patients Only)       Balance Overall balance assessment: Needs assistance Sitting-balance support: No upper extremity supported;Feet supported Sitting balance-Leahy Scale: Normal     Standing balance support: No upper extremity supported;During functional activity;Single extremity supported Standing balance-Leahy Scale: Fair Standing balance comment: demonstrated preference for single UE support                            Cognition Arousal/Alertness: Awake/alert Behavior During Therapy: Flat affect Overall Cognitive Status: No family/caregiver present to determine baseline cognitive functioning                                 General Comments: pleasantly confused. able to engage in some back and forth conversation. dementia at baseline. Does seem a bit [facility]      Exercises      General Comments General comments (skin integrity, edema, etc.): vss on RA      Pertinent Vitals/Pain Pain Assessment: Faces Faces Pain Scale: No hurt Pain Intervention(s): Limited activity within patient's tolerance;Monitored during session    Home Living                      Prior Function  PT Goals (current goals can now be found in the care plan section) Acute Rehab PT Goals Patient Stated Goal: not stated PT Goal Formulation: Patient unable to participate in goal setting Time For Goal Achievement: 03/05/21 Potential to Achieve Goals: Fair Progress towards PT goals: Progressing toward goals    Frequency    Min 2X/week      PT Plan Current plan remains appropriate    Co-evaluation              AM-PAC PT "6 Clicks" Mobility   Outcome Measure  Help needed turning from your back to your side  while in a flat bed without using bedrails?: None Help needed moving from lying on your back to sitting on the side of a flat bed without using bedrails?: None Help needed moving to and from a bed to a chair (including a wheelchair)?: None Help needed standing up from a chair using your arms (e.g., wheelchair or bedside chair)?: None Help needed to walk in hospital room?: A Little Help needed climbing 3-5 steps with a railing? : A Little 6 Click Score: 22    End of Session Equipment Utilized During Treatment: Gait belt Activity Tolerance: Patient tolerated treatment well Patient left: in chair;with call bell/phone within reach;with chair alarm set Nurse Communication: Mobility status PT Visit Diagnosis: Unsteadiness on feet (R26.81)     Time: 7482-7078 PT Time Calculation (min) (ACUTE ONLY): 24 min  Charges:  $Gait Training: 8-22 mins $Therapeutic Activity: 8-22 mins                    Windell Norfolk, DPT, PN1   Supplemental Physical Therapist [place]    Pager [phone] Acute Rehab Office [phone]

## 2021-03-01 DIAGNOSIS — F0390 Unspecified dementia without behavioral disturbance: Secondary | ICD-10-CM | POA: Diagnosis not present

## 2021-03-01 LAB — CBC
HCT: 25.3 % — ABNORMAL LOW (ref 39.0–52.0)
Hemoglobin: 8.1 g/dL — ABNORMAL LOW (ref 13.0–17.0)
MCH: 32.9 pg (ref 26.0–34.0)
MCHC: 32 g/dL (ref 30.0–36.0)
MCV: 102.8 fL — ABNORMAL HIGH (ref 80.0–100.0)
Platelets: 54 10*3/uL — ABNORMAL LOW (ref 150–400)
RBC: 2.46 MIL/uL — ABNORMAL LOW (ref 4.22–5.81)
RDW: 15 % (ref 11.5–15.5)
WBC: 2.9 10*3/uL — ABNORMAL LOW (ref 4.0–10.5)
nRBC: 0 % (ref 0.0–0.2)

## 2021-03-01 LAB — COMPREHENSIVE METABOLIC PANEL
ALT: 49 U/L — ABNORMAL HIGH (ref 0–44)
AST: 83 U/L — ABNORMAL HIGH (ref 15–41)
Albumin: 2.2 g/dL — ABNORMAL LOW (ref 3.5–5.0)
Alkaline Phosphatase: 109 U/L (ref 38–126)
Anion gap: 3 — ABNORMAL LOW (ref 5–15)
BUN: 6 mg/dL — ABNORMAL LOW (ref 8–23)
CO2: 22 mmol/L (ref 22–32)
Calcium: 8.4 mg/dL — ABNORMAL LOW (ref 8.9–10.3)
Chloride: 110 mmol/L (ref 98–111)
Creatinine, Ser: 1.06 mg/dL (ref 0.61–1.24)
GFR, Estimated: >60 mL/min (ref >60)
Glucose, Bld: 115 mg/dL — ABNORMAL HIGH (ref 70–99)
Potassium: 4.1 mmol/L (ref 3.5–5.1)
Sodium: 135 mmol/L (ref 135–145)
Total Bilirubin: 0.9 mg/dL (ref 0.3–1.2)
Total Protein: 5.3 g/dL — ABNORMAL LOW (ref 6.5–8.1)

## 2021-03-01 LAB — GLUCOSE, CAPILLARY
Glucose-Capillary: 97 mg/dL (ref 70–99)
Glucose-Capillary: 118 mg/dL — ABNORMAL HIGH (ref 70–99)
Glucose-Capillary: 177 mg/dL — ABNORMAL HIGH (ref 70–99)
Glucose-Capillary: 136 mg/dL — ABNORMAL HIGH (ref 70–99)

## 2021-03-01 LAB — MAGNESIUM: Magnesium: 1.7 mg/dL (ref 1.7–2.4)

## 2021-03-01 LAB — HEPATITIS B SURFACE ANTIGEN: Hepatitis B Surface Ag: NONREACTIVE

## 2021-03-01 NOTE — Progress Notes (Signed)
PROGRESS NOTE    [name]  GNF:[id number] DOB: [birth year] DOA: 02/17/2021 PCP: Wendie Agreste, MD   Chief Complaint  Patient presents with   Aggressive Behavior    Brief Narrative:   75 year old male with history of hepatitis B, cirrhosis, hepatocellular carcinoma, pancytopenia, diabetes, sleep apnea with CPAP intolerance, dementia who presents from home after he was found to be wandering outside and refusing to go back to home.  Patient was unable to contribute any history due to his dementia, disorientation.  On presentation he was tachycardic in the range of 130s, normalized with IV fluids.  As per the family, he he has been recently combative, wandering here and there and they have been working with a Education officer, museum with plan for nursing facility placement.  On presentation, lab work showed chronic pancytopenia, hemoglobin A1c of 8.6.  Patient was admitted for further management and unsafe discharge plan. Patient was started on IV fluids for mild AKI.  Lab work showed pancytopenia, we have ordered vitamin Q46 and folic acid level.  Hemoglobin is currently stable.  His ammonia level was found to be high which could be associated with his history of cirrhosis, hepatitis B/HCC,started on lactulose.  He was IVCd by his son.  APS involved prior to admission and are assisting with discharge disposition.  He does not have any skilled needs therefore we are looking to place patient in an ALF memory care capabilities.  Disposition complicated by lack of funding to pay for ALF and eligibility for Medicaid given patient continues to own his own home    Assessment & Plan:   Principal Problem:   Dementia without behavioral disturbance (HCC) Active Problems:   OSA (obstructive sleep apnea)   Cancer, hepatocellular (HCC)   Memory impairment of gradual onset   Pancytopenia (HCC)   Psychosis ([place])   Dehydration   Renal insufficiency   Malnutrition of moderate  degree    Dehydration/AKI: Resolved after treatment with IV fluids Creatinine has decreased from 1.38 to 0.96   Dementia with behavioral disturbance:  Improved with the addition of Seroquel and citalopram this admission No longer meets requirements for IVC so will allow to expire Plan is to discharge to ALF with memory care capabilities APS involved prior to admission and assisting with placement needs Owns home-not eligible for Medicaid   Elevated ammonia level:  Likely secondary to dehydration +/- Contributed to recent behavioral changes   History of hepatitis B/cirrhosis/hepatocellular carcinoma:  Follows with Dr. Learta Codding.   Status post partial left hepatectomy on 02/2013.  Has chronic pancytopenia suboptimal blood pressure secondary to cirrhosis Vitamin B12 and folate levels are normal with hemoglobin stable at 8.1  Thrombocytopenia -Chronic, due to above,  at baseline   Type 2 diabetes mellitus: HgbA1c of 7.3 as of 08/2020.   Continue sliding scale insulin.   CBGs well controlled and less than 200   Moderate protein calorie malnutrition Nutrition Problem: Moderate Malnutrition Etiology: chronic illness (dementia) Signs/Symptoms: mild fat depletion, moderate fat depletion, mild muscle depletion, moderate muscle depletion Interventions: Liberalize Diet, MVI, Glucerna shake, Magic cup         DVT prophylaxis: SCDs, no chemical prophylaxis due to thrombocytopenia Code Status: Full Family Communication: None at bedside Disposition:   Status is: Observation    Dispo: The patient is from: Home              Anticipated d/c is to: SNF              Patient  currently is medically stable to d/c.   Difficult to place patient Yes       Consultants:  Psychiatry in ER   Subjective:  Patient himself denies any complaints today, he is asking when he can go home.  Objective: Vitals:   02/28/21 2001 02/28/21 2350 03/01/21 0402 03/01/21 0748  BP: (!) 120/56 (!)  104/53 (!) 101/57 (!) 102/58  Pulse: 74 76 73 75  Resp: 15 16 17 17   Temp: 98.3 F (36.8 C) 98 F (36.7 C) 98.5 F (36.9 C) 99.1 F (37.3 C)  TempSrc: Axillary Axillary Axillary Oral  SpO2: 100% 100% 100% 99%  Weight:      Height:        Intake/Output Summary (Last 24 hours) at 03/01/2021 1135 Last data filed at 02/28/2021 1807 Gross per 24 hour  Intake 420 ml  Output --  Net 420 ml   Filed Weights   02/18/21 2000 02/22/21 0355 02/25/21 0705  Weight: 80.6 kg 86 kg 81.3 kg    Examination:  Awake Alert, even though he is oriented to self, place and date,  he is with impaired cognition and insight . Symmetrical Chest wall movement, Good air movement bilaterally, CTAB RRR,No Gallops,Rubs or new Murmurs, No Parasternal Heave +ve B.Sounds, Abd Soft, No tenderness, No rebound - guarding or rigidity. No Cyanosis, Clubbing or edema, No new Rash or bruise       Data Reviewed: I have personally reviewed following labs and imaging studies  CBC: Recent Labs  Lab 02/25/21 0749 02/28/21 0941 03/01/21 0457  WBC 2.3* 2.4* 2.9*  NEUTROABS  --  1.5*  --   HGB 8.6* 9.3* 8.1*  HCT 27.1* 30.0* 25.3*  MCV 103.0* 104.9* 102.8*  PLT 57* 54* 54*    Basic Metabolic Panel: Recent Labs  Lab 02/25/21 0749 03/01/21 0457  NA 138 135  K 4.1 4.1  CL 110 110  CO2 24 22  GLUCOSE 129* 115*  BUN 7* 6*  CREATININE 1.08 1.06  CALCIUM 8.1* 8.4*  MG 1.7 1.7    GFR: Estimated Creatinine Clearance: 59.3 mL/min (by C-G formula based on SCr of 1.06 mg/dL).  Liver Function Tests: Recent Labs  Lab 03/01/21 0457  AST 83*  ALT 49*  ALKPHOS 109  BILITOT 0.9  PROT 5.3*  ALBUMIN 2.2*    CBG: Recent Labs  Lab 02/28/21 0729 02/28/21 1203 02/28/21 1626 02/28/21 2052 03/01/21 0750  GLUCAP 83 177* 127* 147* 97     No results found for this or any previous visit (from the past 240 hour(s)).       Radiology Studies: No results found.      Scheduled Meds:  sodium  chloride   Intravenous Once   citalopram  10 mg Oral Daily   donepezil  10 mg Oral QHS   feeding supplement (GLUCERNA SHAKE)  237 mL Oral TID BM   ferrous sulfate  325 mg Oral TID WC   insulin aspart  0-6 Units Subcutaneous TID WC   lactulose  30 g Oral TID   memantine  5 mg Oral BID   multivitamin with minerals  1 tablet Oral Daily   pravastatin  20 mg Oral QHS   QUEtiapine  50 mg Oral QHS   Continuous Infusions:   LOS: 0 days     Phillips Climes, MD [facility]   To contact the attending provider between 7A-7P or the covering provider during after hours 7P-7A, please log into the web site [email or web address] and access using  universal [place] password for that web site. If you do not have the password, please call the hospital operator.  03/01/2021, 11:35 AM

## 2021-03-01 NOTE — Progress Notes (Addendum)
2:45pm: CSW received return call from [place] at [facility] - who states that due to patient not having Medicaid yet family would be required to private pay until it is approved then a refund would be issued. CSW will discuss this patient's POA and APS worker.  CSW received return call from [place] at [facility] to discuss patient - CSW provided [place] with patient's income amount and she will speak with her administration and return call to [place] regarding a possible bed offer.  1:30pm: CSW received call from [place] of APS - patient's total income is $1,887 per month.   [facility] does not accept Medicaid.  CSW left voicemail with with Abigail Butts in admissions at [facility].  CSW left contact information to request a return call from admissions at [facility].  9:50am: CSW sent secure e-mail to [employer] with the patient's clinical information attached for review.  CSW spoke with Onalee Hua at [facility] who states the facility does not have a locked unit.  8:40am: CSW spoke with Talbert Forest, APS social worker who states that the patient was not offered a bed at [facility] because APS is not willing to the pay full amount needed for placement and [facility] does not accept patient's with pending Medicaid applications. Bubba Hales does not know the exact amount of the patient's income at this time. Bubba Hales states that the patient's niece Colbi Staubs is his financial and healthcare power of attorney. CSW sent [place] patient's FL2 so she can assist with obtaining placement.  Madilyn Fireman, MSW, LCSW Transitions of Care  Clinical Social Worker II [phone]

## 2021-03-01 NOTE — Progress Notes (Signed)
Nutrition Follow-up  DOCUMENTATION CODES:  Non-severe (moderate) malnutrition in context of chronic illness  INTERVENTION:  Continue regular diet.  Continue Glucerna shakes TID.  Continue Magic Cup TID.  Continue MVI with minerals.  Feeding assistance from RN when needed.  NUTRITION DIAGNOSIS: Moderate Malnutrition related to chronic illness (dementia) as evidenced by mild fat depletion, moderate fat depletion, mild muscle depletion, moderate muscle depletion. - ongoing  GOAL:  Patient will meet greater than or equal to 90% of their needs - not meeting  MONITOR:  PO intake, Supplement acceptance, Labs, Weight trends, Skin, I & O's  REASON FOR ASSESSMENT:  Malnutrition Screening Tool    ASSESSMENT:  [name] is a 75 y.o. male with medical history significant for hepatitis B, cirrhosis, hepatocellular carcinoma, pancytopenia, type 2 diabetes mellitus, sleep apnea with CPAP intolerance, and dementia, now presenting from home where he had been wandering outside all day and refusing to go back inside.  Patient is unable to contribute to the history due to his dementia and disorientation.  Heart rate was reportedly in the 130s with EMS initially and normalized with IV fluids prior to arrival in the ED.  Family reported that the patient has been wandering more recently, occasionally combative, and family has been working with Education officer, museum with plan for likely nursing facility placement.  Pt with intake of 20-100%, mostly 20-25%, the last 4 days. Pt continues to take Glucerna shakes.  Pt's weight stable.  Continue current nutrition plan with addition of assistance at meal times.  Medications: reviewed; Glucerna TID, SSI, lactulose, MVI with minerals  Labs: reviewed; CBG 97-177  Diet Order:   Diet Order             Diet regular Room service appropriate? Yes with Assist; Fluid consistency: Thin  Diet effective now                  EDUCATION NEEDS:  Not  appropriate for education at this time  Skin:  Skin Assessment: Reviewed RN Assessment  Last BM:  02/22/21  Height:  Ht Readings from Last 1 Encounters:  02/25/21 5\' 9"  (1.753 m)   Weight:  Wt Readings from Last 1 Encounters:  02/25/21 81.3 kg   BMI:  Body mass index is 26.47 kg/m.  Estimated Nutritional Needs:  Kcal:  2200-2400 Protein:  120-135 grams Fluid:  > 2 L  Derrel Nip, RD, LDN (she/her/hers) Registered Dietitian I After-Hours/Weekend Pager # in [place]

## 2021-03-02 DIAGNOSIS — F0390 Unspecified dementia without behavioral disturbance: Secondary | ICD-10-CM | POA: Diagnosis not present

## 2021-03-02 LAB — HEPATITIS B SURFACE ANTIBODY, QUANTITATIVE: Hep B S AB Quant (Post): 43.7 m[IU]/mL (ref >9.9)

## 2021-03-02 LAB — HEPATITIS B DNA, ULTRAQUANTITATIVE, PCR
HBV DNA SERPL PCR-ACNC: NOT DETECTED IU/mL
HBV DNA SERPL PCR-LOG IU: UNDETERMINED log10 IU/mL

## 2021-03-02 LAB — GLUCOSE, CAPILLARY
Glucose-Capillary: 104 mg/dL — ABNORMAL HIGH (ref 70–99)
Glucose-Capillary: 151 mg/dL — ABNORMAL HIGH (ref 70–99)
Glucose-Capillary: 135 mg/dL — ABNORMAL HIGH (ref 70–99)
Glucose-Capillary: 152 mg/dL — ABNORMAL HIGH (ref 70–99)

## 2021-03-02 LAB — HEPATITIS B E ANTIBODY: Hep B E Ab: NEGATIVE

## 2021-03-02 LAB — HEPATITIS B E ANTIGEN: Hep B E Ag: NEGATIVE

## 2021-03-02 NOTE — Progress Notes (Signed)
PROGRESS NOTE    [name]  GYJ:[id number] DOB: 05/13/1946 DOA: 02/17/2021 PCP: Wendie Agreste, MD   Chief Complaint  Patient presents with   Aggressive Behavior    Brief Narrative:   75 year old male with history of hepatitis B, cirrhosis, hepatocellular carcinoma, pancytopenia, diabetes, sleep apnea with CPAP intolerance, dementia who presents from home after he was found to be wandering outside and refusing to go back to home.  Patient was unable to contribute any history due to his dementia, disorientation.  On presentation he was tachycardic in the range of 130s, normalized with IV fluids.  As per the family, he he has been recently combative, wandering here and there and they have been working with a Education officer, museum with plan for nursing facility placement.  On presentation, lab work showed chronic pancytopenia, hemoglobin A1c of 8.6.  Patient was admitted for further management and unsafe discharge plan. Patient was started on IV fluids for mild AKI.  Lab work showed pancytopenia, we have ordered vitamin Y63 and folic acid level.  Hemoglobin is currently stable.  His ammonia level was found to be high which could be associated with his history of cirrhosis, hepatitis B/HCC,started on lactulose.  He was IVCd by his son.  APS involved prior to admission and are assisting with discharge disposition.  He does not have any skilled needs therefore we are looking to place patient in an ALF memory care capabilities.  Disposition complicated by lack of funding to pay for ALF and eligibility for Medicaid given patient continues to own his own home    Assessment & Plan:   Principal Problem:   Dementia without behavioral disturbance (HCC) Active Problems:   OSA (obstructive sleep apnea)   Cancer, hepatocellular (HCC)   Memory impairment of gradual onset   Pancytopenia (HCC)   Psychosis ([place])   Dehydration   Renal insufficiency   Malnutrition of moderate  degree    Dehydration/AKI: Resolved after treatment with IV fluids   Dementia with behavioral disturbance:  Improved with the addition of Seroquel and citalopram this admission No longer meets requirements for IVC so will allow to expire Plan is to discharge to ALF with memory care capabilities APS involved prior to admission and assisting with placement needs Owns home-not eligible for Medicaid   Elevated ammonia level:  Likely secondary to dehydration +/- Contributed to recent behavioral changes   History of hepatitis B/cirrhosis/hepatocellular carcinoma:  Follows with Dr. Learta Codding.   Status post partial left hepatectomy on 02/2013.  Has chronic pancytopenia suboptimal blood pressure secondary to cirrhosis Vitamin B12 and folate levels are normal with hemoglobin stable at 8.1  Thrombocytopenia -Chronic, due to above,  at baseline   Type 2 diabetes mellitus: HgbA1c of 7.3 as of 08/2020.   Continue sliding scale insulin.   CBGs well controlled and less than 200   Moderate protein calorie malnutrition Nutrition Problem: Moderate Malnutrition Etiology: chronic illness (dementia) Signs/Symptoms: mild fat depletion, moderate fat depletion, mild muscle depletion, moderate muscle depletion Interventions: Liberalize Diet, MVI, Glucerna shake, Magic cup         DVT prophylaxis: SCDs, no chemical prophylaxis due to thrombocytopenia Code Status: Full Family Communication: None at bedside Disposition:   Status is: Observation    Dispo: The patient is from: Home              Anticipated d/c is to: SNF              Patient currently is medically stable to d/c.  Difficult to place patient Yes       Consultants:  Psychiatry in ER   Subjective:  He denies any complaints, no significant events as discussed with staff   objective: Vitals:   03/02/21 0000 03/02/21 0408 03/02/21 0746 03/02/21 1154  BP: (!) 105/52 (!) 90/52 (!) 104/48 (!) 98/54  Pulse: 71 70 74 68   Resp: 17 17 16 17   Temp: 98.8 F (37.1 C) 98.4 F (36.9 C) 98.1 F (36.7 C) 98.7 F (37.1 C)  TempSrc: Oral Oral Oral Oral  SpO2: 100% 100% 100% 99%  Weight:      Height:       No intake or output data in the 24 hours ending 03/02/21 1357  Filed Weights   02/18/21 2000 02/22/21 0355 02/25/21 0705  Weight: 80.6 kg 86 kg 81.3 kg    Examination:  Awake Alert, even though he is oriented to self, place and date,  he is with impaired cognition and insight . Symmetrical Chest wall movement, Good air movement bilaterally, CTAB RRR,No Gallops,Rubs or new Murmurs, No Parasternal Heave +ve B.Sounds, Abd Soft, No tenderness, No rebound - guarding or rigidity. No Cyanosis, Clubbing or edema, No new Rash or bruise        Data Reviewed: I have personally reviewed following labs and imaging studies  CBC: Recent Labs  Lab 02/25/21 0749 02/28/21 0941 03/01/21 0457  WBC 2.3* 2.4* 2.9*  NEUTROABS  --  1.5*  --   HGB 8.6* 9.3* 8.1*  HCT 27.1* 30.0* 25.3*  MCV 103.0* 104.9* 102.8*  PLT 57* 54* 54*    Basic Metabolic Panel: Recent Labs  Lab 02/25/21 0749 03/01/21 0457  NA 138 135  K 4.1 4.1  CL 110 110  CO2 24 22  GLUCOSE 129* 115*  BUN 7* 6*  CREATININE 1.08 1.06  CALCIUM 8.1* 8.4*  MG 1.7 1.7    GFR: Estimated Creatinine Clearance: 59.3 mL/min (by C-G formula based on SCr of 1.06 mg/dL).  Liver Function Tests: Recent Labs  Lab 03/01/21 0457  AST 83*  ALT 49*  ALKPHOS 109  BILITOT 0.9  PROT 5.3*  ALBUMIN 2.2*    CBG: Recent Labs  Lab 03/01/21 1213 03/01/21 1648 03/01/21 2024 03/02/21 0749 03/02/21 1156  GLUCAP 118* 177* 136* 104* 151*     No results found for this or any previous visit (from the past 240 hour(s)).       Radiology Studies: No results found.      Scheduled Meds:  sodium chloride   Intravenous Once   citalopram  10 mg Oral Daily   donepezil  10 mg Oral QHS   feeding supplement (GLUCERNA SHAKE)  237 mL Oral TID BM    ferrous sulfate  325 mg Oral TID WC   insulin aspart  0-6 Units Subcutaneous TID WC   lactulose  30 g Oral TID   memantine  5 mg Oral BID   multivitamin with minerals  1 tablet Oral Daily   pravastatin  20 mg Oral QHS   QUEtiapine  50 mg Oral QHS   Continuous Infusions:   LOS: 0 days     Phillips Climes, MD [facility]   To contact the attending provider between 7A-7P or the covering provider during after hours 7P-7A, please log into the web site [email or web address] and access using universal [place] password for that web site. If you do not have the password, please call the hospital operator.  03/02/2021, 1:57 PM

## 2021-03-03 DIAGNOSIS — F0390 Unspecified dementia without behavioral disturbance: Secondary | ICD-10-CM | POA: Diagnosis not present

## 2021-03-03 LAB — GLUCOSE, CAPILLARY
Glucose-Capillary: 110 mg/dL — ABNORMAL HIGH (ref 70–99)
Glucose-Capillary: 155 mg/dL — ABNORMAL HIGH (ref 70–99)
Glucose-Capillary: 95 mg/dL (ref 70–99)
Glucose-Capillary: 105 mg/dL — ABNORMAL HIGH (ref 70–99)

## 2021-03-03 NOTE — Progress Notes (Signed)
Physical Therapy Treatment Patient Details Name: [name] MRN: [id number] DOB: [birth year] Today's Date: 03/03/2021    History of Present Illness Pt is a 75 y.o. male admitted 7/1 for dehydration. He presented to the ED from home after wondering around outside all day and family unable to get him to come back inside. PMH significant for COVID+ 02/07/21, hepatitis B, cirrhosis, hepatocellular carcinoma, pancytopenia, type 2 diabetes mellitus, sleep apnea with CPAP intolerance, and dementia. Pt for IVC.    PT Comments    Patient received in bed sleeping but easily woken, pleasantly confused and motivated to work with PT today. Continues to do well with gait training and pattern has improved significant over even last therapy session earlier this week. Able to maintain balance well while turning head and body to look at other staff members and signs in the hallway. Left sitting at EOB with nurse tech present and attending. Continue to recommend SNF and 24/7 assist, although at this point would certainly be along the lines of custodial care.    Follow Up Recommendations  SNF;Supervision/Assistance - 24 hour     Equipment Recommendations  None recommended by PT    Recommendations for Other Services       Precautions / Restrictions Precautions Precautions: Fall;Other (comment) Precaution Comments: can be combative Restrictions Weight Bearing Restrictions: No    Mobility  Bed Mobility Overal bed mobility: Needs Assistance Bed Mobility: Supine to Sit           General bed mobility comments: supervision for safety no physical assistance provided    Transfers Overall transfer level: Needs assistance Equipment used: None Transfers: Sit to/from Stand Sit to Stand: Supervision         General transfer comment: close S for safety, increased time  Ambulation/Gait Ambulation/Gait assistance: Min guard Gait Distance (Feet): 275 Feet Assistive device: Straight cane Gait  Pattern/deviations: Step-through pattern;Decreased stride length;Drifts right/left Gait velocity: decreased   General Gait Details: gait much smoother with SPC today, easily distracted but able to maintain balance even while turning his head left and right to look at staff in hallway or at signs on walls   Stairs             Wheelchair Mobility    Modified Rankin (Stroke Patients Only)       Balance Overall balance assessment: Needs assistance Sitting-balance support: No upper extremity supported;Feet supported Sitting balance-Leahy Scale: Normal     Standing balance support: No upper extremity supported;During functional activity;Single extremity supported Standing balance-Leahy Scale: Fair Standing balance comment: demonstrated preference for single UE support but able to maintain balance well dynamically                            Cognition Arousal/Alertness: Awake/alert Behavior During Therapy: Flat affect Overall Cognitive Status: No family/caregiver present to determine baseline cognitive functioning                                 General Comments: pleasantly confused today but very cooperative and motivated to walk with PT; needed cues for navigation and finding room. Very distracted by other staff and patients in hallway. Sang star spangled banner while we were walking. Tells me he walked from [place] to [employer] this last weekend.       Exercises      General Comments        Pertinent  Vitals/Pain Pain Assessment: Faces Faces Pain Scale: No hurt Pain Intervention(s): Limited activity within patient's tolerance;Monitored during session    Home Living                      Prior Function            PT Goals (current goals can now be found in the care plan section) Acute Rehab PT Goals Patient Stated Goal: not stated PT Goal Formulation: Patient unable to participate in goal setting Time For Goal  Achievement: 03/05/21 Potential to Achieve Goals: Fair Progress towards PT goals: Progressing toward goals    Frequency    Min 2X/week      PT Plan Current plan remains appropriate    Co-evaluation              AM-PAC PT "6 Clicks" Mobility   Outcome Measure  Help needed turning from your back to your side while in a flat bed without using bedrails?: None Help needed moving from lying on your back to sitting on the side of a flat bed without using bedrails?: None Help needed moving to and from a bed to a chair (including a wheelchair)?: None Help needed standing up from a chair using your arms (e.g., wheelchair or bedside chair)?: None Help needed to walk in hospital room?: A Little Help needed climbing 3-5 steps with a railing? : A Little 6 Click Score: 22    End of Session   Activity Tolerance: Patient tolerated treatment well Patient left: in bed;with call bell/phone within reach;Other (comment) (sitting at EOB, NT present and attending) Nurse Communication: Mobility status PT Visit Diagnosis: Unsteadiness on feet (R26.81)     Time: 8768-1157 PT Time Calculation (min) (ACUTE ONLY): 17 min  Charges:  $Gait Training: 8-22 mins                    Windell Norfolk, DPT, PN1   Supplemental Physical Therapist [place]    Pager [phone] Acute Rehab Office [phone]

## 2021-03-03 NOTE — Progress Notes (Signed)
Occupational Therapy Treatment Patient Details Name: [name] MRN: [id number] DOB: 1945-10-18 Today's Date: 03/03/2021    History of present illness Pt is a 75 y.o. male admitted 7/1 for dehydration. He presented to the ED from home after wondering around outside all day and family unable to get him to come back inside. PMH significant for COVID+ 02/07/21, hepatitis B, cirrhosis, hepatocellular carcinoma, pancytopenia, type 2 diabetes mellitus, sleep apnea with CPAP intolerance, and dementia. Pt for IVC.   OT comments  Pt received in bed, pleasantly confused and agreeable to OT session. He requires minguard for mobility in the room for stability and safety with cues for use of spc. Pt requires minguard for grooming while standing at sink level. Pt will continue to benefit from skilled OT services to maximize safety and independence with ADL/IADL and functional mobility. Will continue to follow acutely and progress as tolerated.    Follow Up Recommendations  SNF    Equipment Recommendations  Other (comment) (defer to next venue)    Recommendations for Other Services      Precautions / Restrictions Precautions Precautions: Fall;Other (comment) Precaution Comments: can be combative Restrictions Weight Bearing Restrictions: No       Mobility Bed Mobility Overal bed mobility: Needs Assistance Bed Mobility: Supine to Sit     Supine to sit: Supervision     General bed mobility comments: supervision for safety no physical assistance provided    Transfers Overall transfer level: Needs assistance Equipment used: None Transfers: Sit to/from Stand Sit to Stand: Min guard         General transfer comment: minguard for safety and cues to utilzie spc    Balance Overall balance assessment: Needs assistance Sitting-balance support: No upper extremity supported;Feet supported Sitting balance-Leahy Scale: Normal     Standing balance support: No upper extremity  supported;During functional activity;Single extremity supported Standing balance-Leahy Scale: Fair Standing balance comment: demonstrated preference for single UE support                           ADL either performed or assessed with clinical judgement   ADL Overall ADL's : Needs assistance/impaired Eating/Feeding: Set up;Sitting   Grooming: Min guard;Standing Grooming Details (indicate cue type and reason): completed at sink level             Lower Body Dressing: Min guard;Sit to/from stand   Toilet Transfer: Min guard;Ambulation Toilet Transfer Details (indicate cue type and reason): with use of spc         Functional mobility during ADLs: Min guard;Cane General ADL Comments: pt limited by instability, cognition, decreased activity tolerance. moderate cues to use cane this date     Vision   Vision Assessment?: No apparent visual deficits   Perception     Praxis      Cognition Arousal/Alertness: Awake/alert Behavior During Therapy: Flat affect Overall Cognitive Status: No family/caregiver present to determine baseline cognitive functioning                                 General Comments: pleasantly confused, required increased time for sequencing ADL at sink level;no retention of education/information, was easy to redirect this date. HOH hears best in his right ear        Exercises     Shoulder Instructions       General Comments vss on RA    Pertinent Vitals/ Pain  Pain Assessment: No/denies pain Faces Pain Scale: No hurt  Home Living                                          Prior Functioning/Environment              Frequency  Min 2X/week        Progress Toward Goals  OT Goals(current goals can now be found in the care plan section)  Progress towards OT goals: Progressing toward goals  Acute Rehab OT Goals Patient Stated Goal: not stated OT Goal Formulation: Patient unable to  participate in goal setting Time For Goal Achievement: 03/17/21 Potential to Achieve Goals: Good ADL Goals Additional ADL Goal #1: Pt will increase to modified independence of OOB ADL with <2 cues for sequencing. Additional ADL Goal #2: Pt will perform sustained attention task i.e. coloring, drawing, word find with minimal cues to attend to task x5 mins to increase activity tolerance.  Plan Discharge plan remains appropriate    Co-evaluation                 AM-PAC OT "6 Clicks" Daily Activity     Outcome Measure   Help from another person eating meals?: None Help from another person taking care of personal grooming?: A Little Help from another person toileting, which includes using toliet, bedpan, or urinal?: A Little Help from another person bathing (including washing, rinsing, drying)?: A Little Help from another person to put on and taking off regular upper body clothing?: None Help from another person to put on and taking off regular lower body clothing?: A Little 6 Click Score: 20    End of Session Equipment Utilized During Treatment: Gait belt;Other (comment) (cane)  OT Visit Diagnosis: Unsteadiness on feet (R26.81);Muscle weakness (generalized) (M62.81)   Activity Tolerance Patient tolerated treatment well;Patient limited by fatigue (Encouraged pt to sit up in recliner a bit but pt ultimately wanted back to bed 2/2 feeling tired.)   Patient Left with call bell/phone within reach;in chair;with chair alarm set   Nurse Communication Mobility status        Time: 4174-0814 OT Time Calculation (min): 12 min  Charges: OT General Charges $OT Visit: 1 Visit OT Treatments $Self Care/Home Management : 8-22 mins  Helene Kelp OTR/L Acute Rehabilitation Services Office: [place] 03/03/2021, 10:21 AM

## 2021-03-03 NOTE — NC FL2 (Signed)
[place] LEVEL OF CARE SCREENING TOOL     IDENTIFICATION  Patient Name: [name] Birthdate: [birth year] Sex: male Admission Date (Current Location): 02/17/2021  [facility] and [place] Number:  Herbalist and Address:  [facility], [place] [address], [place], [place] [address]      Provider Number: [id number]  Attending Physician Name and Address:  Albertine Patricia, MD  Relative Name and Phone Number:  [name]   [phone]    Current Level of Care: Hospital Recommended Level of Care: [place] Prior Approval Number:    Date Approved/Denied:   PASRR Number: [id number]  Discharge Plan: SNF    Current Diagnoses: Patient Active Problem List   Diagnosis Date Noted   Malnutrition of moderate degree 02/22/2021   Dehydration 02/18/2021   Renal insufficiency 02/18/2021   Dementia without behavioral disturbance (HCC)    Psychosis ([place]) 10/06/2020   Iron deficiency anemia 11/04/2018   Orthostatic hypotension 08/06/2018   Hypertension 08/06/2018   Hyperlipidemia 08/06/2018   Pancytopenia ([place]) 08/06/2018   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   Insufficient treatment with nasal CPAP 05/05/2018   MCI (mild cognitive impairment) with memory loss 05/05/2018   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   Irregular heart beat 01/28/2018   Noncompliance with CPAP treatment 08/14/2017   Excessive daytime sleepiness 04/09/2017   Snoring 04/09/2017   Memory impairment of gradual onset 04/09/2017   Aortic atherosclerosis ([place]) 11/27/2016   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 11/11/2013   Altered mental status 03/05/2013   Respiratory failure, post-operative ([place]) 03/02/2013   Hepatitis B 02/24/2013   Cancer, hepatocellular ([place]) 02/10/2013   Liver tumor-bleeding 01/15/2013   Epidermal cyst 06/18/2012   OSA (obstructive sleep apnea) 03/13/2012   Abnormal leg movement 12/12/2011   Blood  pressure elevated 09/06/2011   Diabetes mellitus ([place]) 09/06/2011   Narcolepsy 09/06/2011   BPH (benign prostatic hyperplasia) 09/06/2011   Diverticula of colon 09/06/2011    Orientation RESPIRATION BLADDER Height & Weight     Self, Place  Normal Continent Weight: 179 lb 3.7 oz (81.3 kg) Height:  5\' 9"  (175.3 cm)  BEHAVIORAL SYMPTOMS/MOOD NEUROLOGICAL BOWEL NUTRITION STATUS  [place]   Continent Diet (no added salt)  AMBULATORY STATUS COMMUNICATION OF NEEDS Skin   Limited Assist Verbally Normal                       Personal Care Assistance Level of Assistance  Bathing, Feeding, Dressing Bathing Assistance: Limited assistance Feeding assistance: Independent Dressing Assistance: Limited assistance     Functional Limitations Info  Sight, Hearing, Speech Sight Info: Adequate Hearing Info: Impaired Speech Info: Adequate    SPECIAL CARE FACTORS FREQUENCY                       Contractures Contractures Info: Not present    Additional Factors Info  Code Status, Allergies, Psychotropic, Insulin Sliding Scale Code Status Info: Full Allergies Info: Ace Inhibitors Psychotropic Info: Celexa, Seroquel, Aricept, Namenda Insulin Sliding Scale Info: See dc summary for dose       Current Medications (03/03/2021):  This is the current hospital active medication list Current Facility-Administered Medications  Medication Dose Route Frequency Provider Last Rate Last Admin   0.9 %  sodium chloride infusion (Manually program via Guardrails IV Fluids)   Intravenous Once Opyd, Ilene Qua, MD       acetaminophen (TYLENOL) tablet 650 mg  650 mg  Oral Q6H PRN Opyd, Ilene Qua, MD       citalopram (CELEXA) tablet 10 mg  10 mg Oral Daily Akintayo, Mojeed, MD   10 mg at 03/03/21 0921   donepezil (ARICEPT) tablet 10 mg  10 mg Oral QHS Shelly Coss, MD   10 mg at 03/02/21 2106   feeding supplement (GLUCERNA SHAKE) (GLUCERNA SHAKE) liquid 237 mL  237 mL Oral TID BM Adhikari, Amrit, MD    237 mL at 03/03/21 0921   ferrous sulfate tablet 325 mg  325 mg Oral TID WC Adhikari, Amrit, MD   325 mg at 03/03/21 1231   insulin aspart (novoLOG) injection 0-6 Units  0-6 Units Subcutaneous TID WC Opyd, Ilene Qua, MD   1 Units at 03/03/21 1231   lactulose (CHRONULAC) 10 GM/15ML solution 30 g  30 g Oral TID Shelly Coss, MD   30 g at 03/03/21 0921   memantine (NAMENDA) tablet 5 mg  5 mg Oral BID Shelly Coss, MD   5 mg at 03/03/21 0102   multivitamin with minerals tablet 1 tablet  1 tablet Oral Daily Shelly Coss, MD   1 tablet at 03/03/21 0921   ondansetron (ZOFRAN) tablet 4 mg  4 mg Oral Q6H PRN Opyd, Ilene Qua, MD       Or   ondansetron (ZOFRAN) injection 4 mg  4 mg Intravenous Q6H PRN Opyd, Ilene Qua, MD       pravastatin (PRAVACHOL) tablet 20 mg  20 mg Oral QHS Shelly Coss, MD   20 mg at 03/02/21 2106   QUEtiapine (SEROQUEL) tablet 50 mg  50 mg Oral QHS Shelly Coss, MD   50 mg at 03/02/21 2106   senna-docusate (Senokot-S) tablet 1 tablet  1 tablet Oral QHS PRN Opyd, Ilene Qua, MD         Discharge Medications: Please see discharge summary for a list of discharge medications.  Relevant Imaging Results:  Relevant Lab Results:   Additional Information SSN# [id number]/ Has had 2 Covid vaccines plus 1 booster [employer] COVID-19 Vaccine 06/28/2020 , 10/28/2019 , 10/03/2019)  Benard Halsted, LCSW

## 2021-03-03 NOTE — TOC Progression Note (Signed)
Transition of Care [facility]) - Progression Note    Patient Details  Name: [name] MRN: [id number] Date of Birth: [birth year]  Transition of Care [facility]) CM/SW [place], [place] Phone Number: 03/03/2021, 11:22 AM  Clinical Narrative:    CSW attempted to follow up with Ria Comment at [facility]. CSW was informed Ria Comment was on vacation and SW will need to follow back up on Monday for placement determination. SW will continue to follow.   Expected Discharge Plan: Memory Care Barriers to Discharge: Inadequate or no insurance, Pensions consultant, Requiring sitter/restraints  Expected Discharge Plan and Services Expected Discharge Plan: Memory Care In-house Referral: Clinical Social Work   Post Acute Care Choice: Nursing Home Living arrangements for the past 2 months: Apartment                                       Social Determinants of Health (SDOH) Interventions    Readmission Risk Interventions No flowsheet data found.

## 2021-03-03 NOTE — Progress Notes (Signed)
PROGRESS NOTE    [name]  GYJ:[id number] DOB: July 04, 1946 DOA: 02/17/2021 PCP: Wendie Agreste, MD   Chief Complaint  Patient presents with   Aggressive Behavior    Brief Narrative:   75 year old male with history of hepatitis B, cirrhosis, hepatocellular carcinoma, pancytopenia, diabetes, sleep apnea with CPAP intolerance, dementia who presents from home after he was found to be wandering outside and refusing to go back to home.  Patient was unable to contribute any history due to his dementia, disorientation.  On presentation he was tachycardic in the range of 130s, normalized with IV fluids.  As per the family, he he has been recently combative, wandering here and there and they have been working with a Education officer, museum with plan for nursing facility placement.  On presentation, lab work showed chronic pancytopenia, hemoglobin A1c of 8.6.  Patient was admitted for further management and unsafe discharge plan. Patient was started on IV fluids for mild AKI.  Lab work showed pancytopenia, we have ordered vitamin Y63 and folic acid level.  Hemoglobin is currently stable.  His ammonia level was found to be high which could be associated with his history of cirrhosis, hepatitis B/HCC,started on lactulose.  He was IVCd by his son.  APS involved prior to admission and are assisting with discharge disposition.  He does not have any skilled needs therefore we are looking to place patient in an ALF memory care capabilities.  Disposition complicated by lack of funding to pay for ALF and eligibility for Medicaid given patient continues to own his own home    Assessment & Plan:   Principal Problem:   Dementia without behavioral disturbance (HCC) Active Problems:   OSA (obstructive sleep apnea)   Cancer, hepatocellular (HCC)   Memory impairment of gradual onset   Pancytopenia (HCC)   Psychosis ([place])   Dehydration   Renal insufficiency   Malnutrition of moderate  degree    Dehydration/AKI: Resolved after treatment with IV fluids   Dementia with behavioral disturbance:  Improved with the addition of Seroquel and citalopram this admission No longer meets requirements for IVC so will allow to expire Plan is to discharge to ALF with memory care capabilities APS involved prior to admission and assisting with placement needs Owns home-not eligible for Medicaid   Elevated ammonia level:  Likely secondary to dehydration +/- Contributed to recent behavioral changes   History of hepatitis B/cirrhosis/hepatocellular carcinoma:  Follows with Dr. Learta Codding.   Status post partial left hepatectomy on 02/2013.  Has chronic pancytopenia suboptimal blood pressure secondary to cirrhosis Vitamin B12 and folate levels are normal with hemoglobin stable at 8.1  Thrombocytopenia -Chronic, due to above,  at baseline   Type 2 diabetes mellitus: HgbA1c of 7.3 as of 08/2020.   Continue sliding scale insulin.   CBGs well controlled and less than 200   Moderate protein calorie malnutrition Nutrition Problem: Moderate Malnutrition Etiology: chronic illness (dementia) Signs/Symptoms: mild fat depletion, moderate fat depletion, mild muscle depletion, moderate muscle depletion Interventions: Liberalize Diet, MVI, Glucerna shake, Magic cup         DVT prophylaxis: SCDs, no chemical prophylaxis due to thrombocytopenia Code Status: Full Family Communication: None at bedside Disposition:   Status is: Observation    Dispo: The patient is from: Home              Anticipated d/c is to: SNF              Patient currently is medically stable to d/c.  Difficult to place patient Yes       Consultants:  Psychiatry in ER   Subjective:  He denies any complaints, no significant events as discussed with staff.  objective: Vitals:   03/02/21 2340 03/03/21 0351 03/03/21 0731 03/03/21 1247  BP: (!) 108/51 (!) 96/56 (!) 95/56 (!) 102/56  Pulse: 77 77 68 67   Resp: 19 17 18 16   Temp: 98.5 F (36.9 C) 98.7 F (37.1 C) 98.5 F (36.9 C) 98.3 F (36.8 C)  TempSrc: Oral Oral Oral Oral  SpO2: 99% 100% 100% 100%  Weight:      Height:       No intake or output data in the 24 hours ending 03/03/21 1258  Filed Weights   02/18/21 2000 02/22/21 0355 02/25/21 0705  Weight: 80.6 kg 86 kg 81.3 kg    Examination:  Patient was seen and examined, sleeping comfortably, good air entry, regular rate and rhythm.       Data Reviewed: I have personally reviewed following labs and imaging studies  CBC: Recent Labs  Lab 02/25/21 0749 02/28/21 0941 03/01/21 0457  WBC 2.3* 2.4* 2.9*  NEUTROABS  --  1.5*  --   HGB 8.6* 9.3* 8.1*  HCT 27.1* 30.0* 25.3*  MCV 103.0* 104.9* 102.8*  PLT 57* 54* 54*    Basic Metabolic Panel: Recent Labs  Lab 02/25/21 0749 03/01/21 0457  NA 138 135  K 4.1 4.1  CL 110 110  CO2 24 22  GLUCOSE 129* 115*  BUN 7* 6*  CREATININE 1.08 1.06  CALCIUM 8.1* 8.4*  MG 1.7 1.7    GFR: Estimated Creatinine Clearance: 59.3 mL/min (by C-G formula based on SCr of 1.06 mg/dL).  Liver Function Tests: Recent Labs  Lab 03/01/21 0457  AST 83*  ALT 49*  ALKPHOS 109  BILITOT 0.9  PROT 5.3*  ALBUMIN 2.2*    CBG: Recent Labs  Lab 03/02/21 1156 03/02/21 1743 03/02/21 2038 03/03/21 0735 03/03/21 1150  GLUCAP 151* 135* 152* 110* 155*     No results found for this or any previous visit (from the past 240 hour(s)).       Radiology Studies: No results found.      Scheduled Meds:  sodium chloride   Intravenous Once   citalopram  10 mg Oral Daily   donepezil  10 mg Oral QHS   feeding supplement (GLUCERNA SHAKE)  237 mL Oral TID BM   ferrous sulfate  325 mg Oral TID WC   insulin aspart  0-6 Units Subcutaneous TID WC   lactulose  30 g Oral TID   memantine  5 mg Oral BID   multivitamin with minerals  1 tablet Oral Daily   pravastatin  20 mg Oral QHS   QUEtiapine  50 mg Oral QHS   Continuous  Infusions:   LOS: 0 days     Phillips Climes, MD [facility]   To contact the attending provider between 7A-7P or the covering provider during after hours 7P-7A, please log into the web site [email or web address] and access using universal [place] password for that web site. If you do not have the password, please call the hospital operator.  03/03/2021, 12:58 PM

## 2021-03-03 NOTE — TOC Progression Note (Signed)
Transition of Care [facility]) - Progression Note    Patient Details  Name: [name] MRN: [id number] Date of Birth: Oct 15, 1945  Transition of Care [facility]) CM/SW [place], LCSW Phone Number: 03/03/2021, 3:59 PM  Clinical Narrative:    CSW received call from [place]. Jimmye Norman. She stated that patient makes too much money to qualify for ALF level facility Medicaid but that he can get approved if he goes to SNF level memory care by the end of the month. CSW is not certain SNF level is appropriate with his ability to ambulate but will conduct search.    Expected Discharge Plan: Memory Care Barriers to Discharge: Inadequate or no insurance, Pensions consultant, Requiring sitter/restraints  Expected Discharge Plan and Services Expected Discharge Plan: Memory Care In-house Referral: Clinical Social Work   Post Acute Care Choice: Nursing Home Living arrangements for the past 2 months: Apartment                                       Social Determinants of Health (SDOH) Interventions    Readmission Risk Interventions No flowsheet data found.

## 2021-03-04 DIAGNOSIS — F0390 Unspecified dementia without behavioral disturbance: Secondary | ICD-10-CM | POA: Diagnosis not present

## 2021-03-04 LAB — COMPREHENSIVE METABOLIC PANEL
ALT: 54 U/L — ABNORMAL HIGH (ref 0–44)
AST: 87 U/L — ABNORMAL HIGH (ref 15–41)
Albumin: 2.3 g/dL — ABNORMAL LOW (ref 3.5–5.0)
Alkaline Phosphatase: 117 U/L (ref 38–126)
Anion gap: 4 — ABNORMAL LOW (ref 5–15)
BUN: 8 mg/dL (ref 8–23)
CO2: 21 mmol/L — ABNORMAL LOW (ref 22–32)
Calcium: 8.5 mg/dL — ABNORMAL LOW (ref 8.9–10.3)
Chloride: 111 mmol/L (ref 98–111)
Creatinine, Ser: 1.01 mg/dL (ref 0.61–1.24)
GFR, Estimated: >60 mL/min (ref >60)
Glucose, Bld: 143 mg/dL — ABNORMAL HIGH (ref 70–99)
Potassium: 3.8 mmol/L (ref 3.5–5.1)
Sodium: 136 mmol/L (ref 135–145)
Total Bilirubin: 1.1 mg/dL (ref 0.3–1.2)
Total Protein: 5.7 g/dL — ABNORMAL LOW (ref 6.5–8.1)

## 2021-03-04 LAB — CBC
HCT: 26.5 % — ABNORMAL LOW (ref 39.0–52.0)
Hemoglobin: 8.6 g/dL — ABNORMAL LOW (ref 13.0–17.0)
MCH: 33 pg (ref 26.0–34.0)
MCHC: 32.5 g/dL (ref 30.0–36.0)
MCV: 101.5 fL — ABNORMAL HIGH (ref 80.0–100.0)
Platelets: 55 10*3/uL — ABNORMAL LOW (ref 150–400)
RBC: 2.61 MIL/uL — ABNORMAL LOW (ref 4.22–5.81)
RDW: 15.4 % (ref 11.5–15.5)
WBC: 2.7 10*3/uL — ABNORMAL LOW (ref 4.0–10.5)
nRBC: 0 % (ref 0.0–0.2)

## 2021-03-04 LAB — GLUCOSE, CAPILLARY
Glucose-Capillary: 96 mg/dL (ref 70–99)
Glucose-Capillary: 163 mg/dL — ABNORMAL HIGH (ref 70–99)
Glucose-Capillary: 130 mg/dL — ABNORMAL HIGH (ref 70–99)
Glucose-Capillary: 148 mg/dL — ABNORMAL HIGH (ref 70–99)

## 2021-03-04 MED ORDER — DOXYCYCLINE HYCLATE 100 MG PO TABS
200.0000 mg | ORAL_TABLET | Freq: Once | ORAL | Status: AC
  Administered 2021-03-04: 200 mg via ORAL
  Filled 2021-03-04: qty 2

## 2021-03-04 NOTE — Progress Notes (Signed)
PROGRESS NOTE    [name]  [id number]:[id number] DOB: 1946/07/23 DOA: 02/17/2021 PCP: Wendie Agreste, MD   Chief Complaint  Patient presents with   Aggressive Behavior    Brief Narrative:   75 year old male with history of hepatitis B, cirrhosis, hepatocellular carcinoma, pancytopenia, diabetes, sleep apnea with CPAP intolerance, dementia who presents from home after he was found to be wandering outside and refusing to go back to home.  Patient was unable to contribute any history due to his dementia, disorientation.  On presentation he was tachycardic in the range of 130s, normalized with IV fluids.  As per the family, he he has been recently combative, wandering here and there and they have been working with a Education officer, museum with plan for nursing facility placement.  On presentation, lab work showed chronic pancytopenia, hemoglobin A1c of 8.6.  Patient was admitted for further management and unsafe discharge plan. Patient was started on IV fluids for mild AKI.  Lab work showed pancytopenia, we have ordered vitamin L24 and folic acid level.  Hemoglobin is currently stable.  His ammonia level was found to be high which could be associated with his history of cirrhosis, hepatitis B/HCC,started on lactulose.  He was IVCd by his son.  APS involved prior to admission and are assisting with discharge disposition.  He does not have any skilled needs therefore we are looking to place patient in an ALF memory care capabilities.  Disposition complicated by lack of funding to pay for ALF and eligibility for Medicaid given patient continues to own his own home    Assessment & Plan:   Principal Problem:   Dementia without behavioral disturbance (HCC) Active Problems:   OSA (obstructive sleep apnea)   Cancer, hepatocellular (HCC)   Memory impairment of gradual onset   Pancytopenia (HCC)   Psychosis ([place])   Dehydration   Renal insufficiency   Malnutrition of moderate  degree    Dehydration/AKI: Resolved after treatment with IV fluids   Dementia with behavioral disturbance:  Improved with the addition of Seroquel and citalopram this admission No longer meets requirements for IVC so will allow to expire Plan is to discharge to ALF with memory care capabilities APS involved prior to admission and assisting with placement needs Owns home-not eligible for Medicaid   Elevated ammonia level:  Likely secondary to dehydration +/- Contributed to recent behavioral changes   History of hepatitis B/cirrhosis/hepatocellular carcinoma:  Follows with Dr. Learta Codding.   Status post partial left hepatectomy on 02/2013.  Has chronic pancytopenia suboptimal blood pressure secondary to cirrhosis Vitamin B12 and folate levels are normal with hemoglobin stable at 8.1  Thrombocytopenia -Chronic, due to above,  at baseline   Type 2 diabetes mellitus: HgbA1c of 7.3 as of 08/2020.   Continue sliding scale insulin.   CBGs well controlled and less than 200   Moderate protein calorie malnutrition Nutrition Problem: Moderate Malnutrition Etiology: chronic illness (dementia) Signs/Symptoms: mild fat depletion, moderate fat depletion, mild muscle depletion, moderate muscle depletion Interventions: Liberalize Diet, MVI, Glucerna shake, Magic cup   Patient was found to have tick in the chest area, no rash noted, will give one dose of doxycycline for tick prophylaxis     DVT prophylaxis: SCDs, no chemical prophylaxis due to thrombocytopenia Code Status: Full Family Communication: None at bedside Disposition:   Status is: Observation    Dispo: The patient is from: Home              Anticipated d/c is to: SNF  Patient currently is medically stable to d/c.   Difficult to place patient Yes       Consultants:  Psychiatry in ER   Subjective:  He denies any complaints, patient was noted to have taken his chest, no tick bite site noted, tick is not  engorged.  objective: Vitals:   03/04/21 0332 03/04/21 0338 03/04/21 0800 03/04/21 1208  BP: (!) 86/50 (!) 96/55 (!) 88/54 (!) 95/52  Pulse: 65 67 66 71  Resp: 18 18 17 18   Temp: 98.1 F (36.7 C) 98.1 F (36.7 C) 98.6 F (37 C) 98.6 F (37 C)  TempSrc: Oral Oral Oral Oral  SpO2: 100% 100% 100% 100%  Weight:      Height:       No intake or output data in the 24 hours ending 03/04/21 1238  Filed Weights   02/18/21 2000 02/22/21 0355 02/25/21 0705  Weight: 80.6 kg 86 kg 81.3 kg    Examination:  Patient was seen and examined, awake, alert, in bed eating breakfast.       Data Reviewed: I have personally reviewed following labs and imaging studies  CBC: Recent Labs  Lab 02/28/21 0941 03/01/21 0457 03/04/21 0035  WBC 2.4* 2.9* 2.7*  NEUTROABS 1.5*  --   --   HGB 9.3* 8.1* 8.6*  HCT 30.0* 25.3* 26.5*  MCV 104.9* 102.8* 101.5*  PLT 54* 54* 55*    Basic Metabolic Panel: Recent Labs  Lab 03/01/21 0457 03/04/21 0035  NA 135 136  K 4.1 3.8  CL 110 111  CO2 22 21*  GLUCOSE 115* 143*  BUN 6* 8  CREATININE 1.06 1.01  CALCIUM 8.4* 8.5*  MG 1.7  --     GFR: Estimated Creatinine Clearance: 62.2 mL/min (by C-G formula based on SCr of 1.01 mg/dL).  Liver Function Tests: Recent Labs  Lab 03/01/21 0457 03/04/21 0035  AST 83* 87*  ALT 49* 54*  ALKPHOS 109 117  BILITOT 0.9 1.1  PROT 5.3* 5.7*  ALBUMIN 2.2* 2.3*    CBG: Recent Labs  Lab 03/03/21 1150 03/03/21 1702 03/03/21 2100 03/04/21 0803 03/04/21 1210  GLUCAP 155* 95 105* 96 163*     No results found for this or any previous visit (from the past 240 hour(s)).       Radiology Studies: No results found.      Scheduled Meds:  sodium chloride   Intravenous Once   citalopram  10 mg Oral Daily   donepezil  10 mg Oral QHS   feeding supplement (GLUCERNA SHAKE)  237 mL Oral TID BM   ferrous sulfate  325 mg Oral TID WC   insulin aspart  0-6 Units Subcutaneous TID WC   lactulose  30 g  Oral TID   memantine  5 mg Oral BID   multivitamin with minerals  1 tablet Oral Daily   pravastatin  20 mg Oral QHS   QUEtiapine  50 mg Oral QHS   Continuous Infusions:   LOS: 0 days     Phillips Climes, MD [facility]   To contact the attending provider between 7A-7P or the covering provider during after hours 7P-7A, please log into the web site [email or web address] and access using universal [place] password for that web site. If you do not have the password, please call the hospital operator.  03/04/2021, 12:38 PM

## 2021-03-04 NOTE — Progress Notes (Signed)
Vital recheck

## 2021-03-04 NOTE — Progress Notes (Signed)
Small tick found on patient's chest. Tick was removed. Have notified Dr.Elgergawy. Will monitor site. Patient has no complaints.

## 2021-03-05 DIAGNOSIS — F0390 Unspecified dementia without behavioral disturbance: Secondary | ICD-10-CM | POA: Diagnosis not present

## 2021-03-05 LAB — GLUCOSE, CAPILLARY
Glucose-Capillary: 113 mg/dL — ABNORMAL HIGH (ref 70–99)
Glucose-Capillary: 202 mg/dL — ABNORMAL HIGH (ref 70–99)
Glucose-Capillary: 131 mg/dL — ABNORMAL HIGH (ref 70–99)
Glucose-Capillary: 126 mg/dL — ABNORMAL HIGH (ref 70–99)

## 2021-03-05 NOTE — Progress Notes (Signed)
PROGRESS NOTE    [name]  VZD:[id number] DOB: 02-14-1946 DOA: 02/17/2021 PCP: Wendie Agreste, MD   Chief Complaint  Patient presents with   Aggressive Behavior    Brief Narrative:   75 year old male with history of hepatitis B, cirrhosis, hepatocellular carcinoma, pancytopenia, diabetes, sleep apnea with CPAP intolerance, dementia who presents from home after he was found to be wandering outside and refusing to go back to home.  Patient was unable to contribute any history due to his dementia, disorientation.  On presentation he was tachycardic in the range of 130s, normalized with IV fluids.  As per the family, he he has been recently combative, wandering here and there and they have been working with a Education officer, museum with plan for nursing facility placement.  On presentation, lab work showed chronic pancytopenia, hemoglobin A1c of 8.6.  Patient was admitted for further management and unsafe discharge plan. Patient was started on IV fluids for mild AKI.  Lab work showed pancytopenia, we have ordered vitamin I95 and folic acid level.  Hemoglobin is currently stable.  His ammonia level was found to be high which could be associated with his history of cirrhosis, hepatitis B/HCC,started on lactulose.  He was IVCd by his son.  APS involved prior to admission and are assisting with discharge disposition.  He does not have any skilled needs therefore we are looking to place patient in an ALF memory care capabilities.  Disposition complicated by lack of funding to pay for ALF and eligibility for Medicaid given patient continues to own his own home    Assessment & Plan:   Principal Problem:   Dementia without behavioral disturbance ([place]) Active Problems:   OSA (obstructive sleep apnea)   Cancer, hepatocellular (HCC)   Memory impairment of gradual onset   Pancytopenia ([place])   Psychosis ([place])   Dehydration   Renal insufficiency   Malnutrition of moderate  degree    Dehydration/AKI: Resolved after treatment with IV fluids, with good appetite and oral intake.   Dementia with behavioral disturbance:  Improved with the addition of Seroquel and citalopram this admission No longer meets requirements for IVC so will allow to expire Plan is to discharge to ALF with memory care capabilities APS involved prior to admission and assisting with placement needs Owns home-not eligible for Medicaid   Elevated ammonia level:  Likely secondary to dehydration +/- Contributed to recent behavioral changes   History of hepatitis B/cirrhosis/hepatocellular carcinoma/transaminitis Follows with Dr. Learta Codding.   Status post partial left hepatectomy on 02/2013.  Has chronic pancytopenia suboptimal blood pressure secondary to cirrhosis Vitamin B12 and folate levels are normal with hemoglobin stable at 8.1 Monitor LFTs intermittently.  Thrombocytopenia -Chronic, due to above,  at baseline   Type 2 diabetes mellitus: HgbA1c of 7.3 as of 08/2020.   Continue sliding scale insulin.   CBGs well controlled and less than 200   Moderate protein calorie malnutrition Nutrition Problem: Moderate Malnutrition Etiology: chronic illness (dementia) Signs/Symptoms: mild fat depletion, moderate fat depletion, mild muscle depletion, moderate muscle depletion Interventions: Liberalize Diet, MVI, Glucerna shake, Magic cup   Patient was found to have tick in the chest area, no rash noted, received 1 dose of 200 mg of doxycycline for tick prophylaxis 7/16.  DVT prophylaxis: SCDs, no chemical prophylaxis due to thrombocytopenia, patient was encouraged to ambulate. Code Status: Full Family Communication: None at bedside Disposition:   Status is: Observation    Dispo: The patient is from: Home  Anticipated d/c is to: SNF              Patient currently is medically stable to d/c.   Difficult to place patient Yes       Consultants:  Psychiatry in  ER   Subjective:  Denies any complaints today.  objective: Vitals:   03/04/21 1645 03/05/21 0023 03/05/21 0421 03/05/21 0759  BP: (!) 102/52 100/60 (!) 107/57 (!) 95/50  Pulse: 73 70 71 69  Resp: 17 16 17 18   Temp: 98.5 F (36.9 C) 98.3 F (36.8 C) 98.2 F (36.8 C) 98.4 F (36.9 C)  TempSrc: Oral Axillary Axillary Oral  SpO2: 100% 100% 100% 98%  Weight:      Height:       No intake or output data in the 24 hours ending 03/05/21 1155  Filed Weights   02/18/21 2000 02/22/21 0355 02/25/21 0705  Weight: 80.6 kg 86 kg 81.3 kg    Examination:  awake, alert, greater than rhythm, no rubs or gallops, no rash       Data Reviewed: I have personally reviewed following labs and imaging studies  CBC: Recent Labs  Lab 02/28/21 0941 03/01/21 0457 03/04/21 0035  WBC 2.4* 2.9* 2.7*  NEUTROABS 1.5*  --   --   HGB 9.3* 8.1* 8.6*  HCT 30.0* 25.3* 26.5*  MCV 104.9* 102.8* 101.5*  PLT 54* 54* 55*    Basic Metabolic Panel: Recent Labs  Lab 03/01/21 0457 03/04/21 0035  NA 135 136  K 4.1 3.8  CL 110 111  CO2 22 21*  GLUCOSE 115* 143*  BUN 6* 8  CREATININE 1.06 1.01  CALCIUM 8.4* 8.5*  MG 1.7  --     GFR: Estimated Creatinine Clearance: 62.2 mL/min (by C-G formula based on SCr of 1.01 mg/dL).  Liver Function Tests: Recent Labs  Lab 03/01/21 0457 03/04/21 0035  AST 83* 87*  ALT 49* 54*  ALKPHOS 109 117  BILITOT 0.9 1.1  PROT 5.3* 5.7*  ALBUMIN 2.2* 2.3*    CBG: Recent Labs  Lab 03/04/21 0803 03/04/21 1210 03/04/21 1647 03/04/21 2110 03/05/21 0757  GLUCAP 96 163* 130* 148* 113*     No results found for this or any previous visit (from the past 240 hour(s)).       Radiology Studies: No results found.      Scheduled Meds:  sodium chloride   Intravenous Once   citalopram  10 mg Oral Daily   donepezil  10 mg Oral QHS   feeding supplement (GLUCERNA SHAKE)  237 mL Oral TID BM   ferrous sulfate  325 mg Oral TID WC   insulin aspart  0-6  Units Subcutaneous TID WC   lactulose  30 g Oral TID   memantine  5 mg Oral BID   multivitamin with minerals  1 tablet Oral Daily   pravastatin  20 mg Oral QHS   QUEtiapine  50 mg Oral QHS   Continuous Infusions:   LOS: 0 days     Phillips Climes, MD [facility]   To contact the attending provider between 7A-7P or the covering provider during after hours 7P-7A, please log into the web site [email or web address] and access using universal [place] password for that web site. If you do not have the password, please call the hospital operator.  03/05/2021, 11:55 AM

## 2021-03-05 NOTE — Progress Notes (Signed)
Patient refusing afternoon meds, stating "I'm just so tired of all of this mess!". I have educated him on the importance of taking his medications and the reasons for which he is staying here now. I offered emotional support as well.   The patient would like to speak with a Education officer, museum tomorrow to ask some questions regarding his disposition.

## 2021-03-06 ENCOUNTER — Ambulatory Visit (INDEPENDENT_AMBULATORY_CARE_PROVIDER_SITE_OTHER): Payer: Medicare Other | Admitting: *Deleted

## 2021-03-06 DIAGNOSIS — F0280 Dementia in other diseases classified elsewhere without behavioral disturbance: Secondary | ICD-10-CM | POA: Diagnosis not present

## 2021-03-06 DIAGNOSIS — R413 Other amnesia: Secondary | ICD-10-CM

## 2021-03-06 DIAGNOSIS — F0392 Unspecified dementia, unspecified severity, with psychotic disturbance: Secondary | ICD-10-CM

## 2021-03-06 DIAGNOSIS — E119 Type 2 diabetes mellitus without complications: Secondary | ICD-10-CM

## 2021-03-06 DIAGNOSIS — F0391 Unspecified dementia with behavioral disturbance: Secondary | ICD-10-CM

## 2021-03-06 LAB — GLUCOSE, CAPILLARY
Glucose-Capillary: 102 mg/dL — ABNORMAL HIGH (ref 70–99)
Glucose-Capillary: 154 mg/dL — ABNORMAL HIGH (ref 70–99)
Glucose-Capillary: 114 mg/dL — ABNORMAL HIGH (ref 70–99)
Glucose-Capillary: 144 mg/dL — ABNORMAL HIGH (ref 70–99)

## 2021-03-06 NOTE — Progress Notes (Signed)
[facility] PROGRESS NOTE  [name] [id number]:[id number] DOB: [birth year] DOA: 02/17/2021 PCP: Wendie Agreste, MD  Status: The patient remains OBS appropriate and will d/c before 2 midnights. Admitted with preadmission IVC in place.  No longer meets requirements for IVC so we will let expire  Dispo: The patient is from: Home              Anticipated d/c is to: ALF with memory care-if unable to find appropriate bed may need to ask APS to reevaluate patient and home situation to determine if it would be safe to discharge him back home with family              Patient currently is medically stable to d/c.  Difficult to place patient Yes   Level of care: Med-Surg  Code Status: Full Family Communication:  DVT prophylaxis: SCD COVID vaccination status: Pfizer 10/03/2019, 10/28/2019, and booster 06/28/2020  HPI: 75 year old male with history of hepatitis B, cirrhosis, hepatocellular carcinoma, pancytopenia, diabetes, sleep apnea with CPAP intolerance, dementia who presents from home after he was found to be wandering outside and refusing to go back to home.  Patient was unable to contribute any history due to his dementia, disorientation.  On presentation he was tachycardic in the range of 130s, normalized with IV fluids.  As per the family, he he has been recently combative, wandering here and there and they have been working with a Education officer, museum with plan for nursing facility placement.  On presentation, lab work showed chronic pancytopenia, hemoglobin A1c of 8.6.  Patient was admitted for further management and unsafe discharge plan. Patient was started on IV fluids for mild AKI.  Lab work showed pancytopenia, we have ordered vitamin Q73 and folic acid level.  Hemoglobin is currently stable.  His ammonia level was found to be high which could be associated with his history of cirrhosis, hepatitis B/HCC,started on lactulose.  He was IVCd by his son.  APS involved prior to admission and are  assisting with discharge disposition.  He does not have any skilled needs therefore we are looking to place patient in an ALF memory care capabilities.  Disposition complicated by lack of funding to pay for ALF and eligibility for Medicaid given patient continues to own his own home  Subjective: Examined patient while he was in bed.  Very pleasant.  Continues to ask questions about discharge disposition.  Explained that plan is in process and he would be the first to know.  He also states that yesterday was really bad and he felt bad but was unable to describe any associated symptoms other than feeling sick.  Interestingly the patient apparently remembered not only that I was going on a short vacation last week but the location.  He asked me how my trip to [place] was.   Objective: Vitals:   03/06/21 0758 03/06/21 1204  BP: (!) 105/56 (!) 95/45  Pulse: 66 68  Resp: 17 19  Temp: 98.7 F (37.1 C) 98.7 F (37.1 C)  SpO2: 99% 100%    Intake/Output Summary (Last 24 hours) at 03/06/2021 1218 Last data filed at 03/06/2021 0400 Gross per 24 hour  Intake 240 ml  Output --  Net 240 ml   Filed Weights   02/22/21 0355 02/25/21 0705 03/06/21 0346  Weight: 86 kg 81.3 kg 76.4 kg    Exam:  Constitutional: No acute distress, pleasant with questions regarding discharge plan Respiratory: Anterior lung sounds clear to auscultation.  Room air.  No  increased work of breathing detected. Cardiovascular: S1-S2, SBP's in the 90s, no peripheral edema, pulse regular Abdomen:  LBM 7/17, soft with normal active bowel sounds.  Failure oral intake Neurological: Cranial nerves intact except for he is hearing loss.  Ambulates without difficulty or gait disturbance. Psychiatric: Alert, oriented to self, place and year.  Aware he remains in the hospital and has multiple questions regarding discharge plan.  Of note, he was able to remember that I was going out of town for vacation and asked me how my trip to  [place] went  Assessment/Plan: Acute problems:  Dehydration/AKI:  Resolved after treatment with IV fluids Creatinine has decreased from 1.38 to 0.96   Dementia with behavioral disturbance:  Improved with the addition of Seroquel and citalopram this admission IVC expired and does not meet criteria to continue Plan is to discharge to ALF with memory care capabilities APS involved prior to admission  If unable to locate facility for discharge may need to have APS reevaluate patient and home situation to determine if he is eligible to return home noting we have started him on different medications during the admission.  His behavior may be such that he would be able to return to home as long as he takes his medications.   Elevated ammonia level:  Likely secondary to dehydration +/- Contributed to recent behavioral changes   History of hepatitis B/cirrhosis/hepatocellular carcinoma:  Follows with Dr. Learta Codding.   Status post partial left hepatectomy on 02/2013.  Has chronic pancytopenia suboptimal blood pressure secondary to cirrhosis Vitamin B12 and folate levels are normal with hemoglobin stable at 8.1   Type 2 diabetes mellitus:  HgbA1c of 7.3 as of 08/2020.   Continue sliding scale insulin.   CBGs well controlled and less than 200  Moderate protein calorie malnutrition Nutrition Problem: Moderate Malnutrition Etiology: chronic illness (dementia) Signs/Symptoms: mild fat depletion, moderate fat depletion, mild muscle depletion, moderate muscle depletion Interventions: Liberalize Diet, MVI, Glucerna shake, Magic cup Body mass index is 24.87 kg/m.      Data Reviewed: Basic Metabolic Panel: Recent Labs  Lab 03/01/21 0457 03/04/21 0035  NA 135 136  K 4.1 3.8  CL 110 111  CO2 22 21*  GLUCOSE 115* 143*  BUN 6* 8  CREATININE 1.06 1.01  CALCIUM 8.4* 8.5*  MG 1.7  --    Liver Function Tests: Recent Labs  Lab 03/01/21 0457 03/04/21 0035  AST 83* 87*  ALT 49* 54*   ALKPHOS 109 117  BILITOT 0.9 1.1  PROT 5.3* 5.7*  ALBUMIN 2.2* 2.3*   No results for input(s): LIPASE, AMYLASE in the last 168 hours. No results for input(s): AMMONIA in the last 168 hours.  CBC: Recent Labs  Lab 02/28/21 0941 03/01/21 0457 03/04/21 0035  WBC 2.4* 2.9* 2.7*  NEUTROABS 1.5*  --   --   HGB 9.3* 8.1* 8.6*  HCT 30.0* 25.3* 26.5*  MCV 104.9* 102.8* 101.5*  PLT 54* 54* 55*   Cardiac Enzymes: No results for input(s): CKTOTAL, CKMB, CKMBINDEX, TROPONINI in the last 168 hours. BNP (last 3 results) Recent Labs    02/25/21 0749  BNP 89.1    ProBNP (last 3 results) No results for input(s): PROBNP in the last 8760 hours.  CBG: Recent Labs  Lab 03/05/21 1251 03/05/21 1637 03/05/21 2041 03/06/21 0801 03/06/21 1203  GLUCAP 202* 131* 126* 102* 154*    No results found for this or any previous visit (from the past 240 hour(s)).   Studies: No results  found.  Scheduled Meds:  sodium chloride   Intravenous Once   citalopram  10 mg Oral Daily   donepezil  10 mg Oral QHS   feeding supplement (GLUCERNA SHAKE)  237 mL Oral TID BM   ferrous sulfate  325 mg Oral TID WC   insulin aspart  0-6 Units Subcutaneous TID WC   lactulose  30 g Oral TID   memantine  5 mg Oral BID   multivitamin with minerals  1 tablet Oral Daily   pravastatin  20 mg Oral QHS   QUEtiapine  50 mg Oral QHS   Continuous Infusions:  Principal Problem:   Dementia without behavioral disturbance (HCC) Active Problems:   OSA (obstructive sleep apnea)   Cancer, hepatocellular (HCC)   Memory impairment of gradual onset   Pancytopenia (HCC)   Psychosis ([place])   Dehydration   Renal insufficiency   Malnutrition of moderate degree   Consultants: Psychiatry while in the ER  Procedures: None  Antibiotics: Anti-infectives (From admission, onward)    Start     Dose/Rate Route Frequency Ordered Stop   03/04/21 1330  doxycycline (VIBRA-TABS) tablet 200 mg        200 mg Oral  Once  03/04/21 1244 03/04/21 1332          Time spent: 15 minutes    Erin Hearing ANP  [facility] 7 am - 330 pm/M-F for direct patient care and secure chat Please refer to Amion for contact info 0  days

## 2021-03-06 NOTE — Patient Instructions (Signed)
Visit Information  PATIENT GOALS:  Goals Addressed             This Visit's Progress    To develop long term care plan for pt       Timeframe:  Long-Range Goal Priority:  High Start Date:      01/31/2021                       Expected End Date:   04/19/2021                    Follow Up Date 03/20/2021   -resolve financial barriers and apply for Medicaid -review list of Assisted Living facilities to be mailed to you -research and tour facilities of interest -consider in-home support (private duty care)  - begin a notebook of services in my neighborhood or community - call 211 when I need some help - follow-up on any referrals for help I am given - think ahead to make sure my need does not become an emergency - make a note about what I need to have by the phone or take with me, like an identification card or social security number have a back-up plan - have a back-up plan - make a list of family or friends that I can call    Why is this important?   Knowing how and where to find help for yourself or family in your neighborhood and community is an important skill.  You will want to take some steps to learn how.    Notes:         The patient verbalized understanding of instructions, educational materials, and care plan provided today and declined offer to receive copy of patient instructions, educational materials, and care plan.   Telephone follow up appointment with care management team member scheduled for:03/20/21  [name] MSW, LCSW Licensed Clinical Social Worker [employer] [phone]

## 2021-03-06 NOTE — Chronic Care Management (AMB) (Signed)
Chronic Care Management    Clinical Social Work Note  03/06/2021 Name: [name] MRN: [id number] DOB: June 01, 1946  [name] is a 75 y.o. year old male who is a primary care patient of Carlota Raspberry, Ranell Patrick, MD. The CCM team was consulted to assist the patient with chronic disease management and/or care coordination needs related to: Level of Care Concerns and Caregiver Stress.   Engaged with patient by telephone for follow up visit in response to provider referral for social work chronic care management and care coordination services.   Consent to Services:  The patient was given information about Chronic Care Management services, agreed to services, and gave verbal consent prior to initiation of services.  Please see initial visit note for detailed documentation.   Patient agreed to services and consent obtained.   Assessment: Review of patient past medical history, allergies, medications, and health status, including review of relevant consultants reports was performed today as part of a comprehensive evaluation and provision of chronic care management and care coordination services.     SDOH (Social Determinants of Health) assessments and interventions performed:    Advanced Directives Status: Not addressed in this encounter.  CCM Care Plan  Allergies  Allergen Reactions   Ace Inhibitors Swelling and Other (See Comments)    Angioedema - face.     Facility-Administered Encounter Medications as of 03/06/2021  Medication   0.9 %  sodium chloride infusion (Manually program via Guardrails IV Fluids)   acetaminophen (TYLENOL) tablet 650 mg   citalopram (CELEXA) tablet 10 mg   donepezil (ARICEPT) tablet 10 mg   feeding supplement (GLUCERNA SHAKE) (GLUCERNA SHAKE) liquid 237 mL   ferrous sulfate tablet 325 mg   insulin aspart (novoLOG) injection 0-6 Units   lactulose (CHRONULAC) 10 GM/15ML solution 30 g   memantine (NAMENDA) tablet 5 mg   multivitamin with minerals  tablet 1 tablet   ondansetron (ZOFRAN) tablet 4 mg   Or   ondansetron (ZOFRAN) injection 4 mg   pravastatin (PRAVACHOL) tablet 20 mg   QUEtiapine (SEROQUEL) tablet 50 mg   senna-docusate (Senokot-S) tablet 1 tablet   Outpatient Encounter Medications as of 03/06/2021  Medication Sig   donepezil (ARICEPT) 10 MG tablet Take 1 tablet (10 mg total) by mouth at bedtime.   ferrous sulfate 325 (65 FE) MG tablet Take 325 mg by mouth 2 (two) times daily with a meal.   memantine (NAMENDA) 5 MG tablet Take 1 tablet (5 mg total) by mouth 2 (two) times daily.   metFORMIN (GLUCOPHAGE) 500 MG tablet TAKE 1 TABLET BY MOUTH TWICE DAILY WITH A MEAL (Patient taking differently: Take 500 mg by mouth 2 (two) times daily with a meal.)   pravastatin (PRAVACHOL) 20 MG tablet Take 1 tablet (20 mg total) by mouth at bedtime.   Psyllium (METAMUCIL PO) Take 1 Scoop by mouth daily as needed (constipation).   QUEtiapine (SEROQUEL) 25 MG tablet Take 0.5 tablets (12.5 mg total) by mouth at bedtime.    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   Irregular heart beat 01/28/2018   Noncompliance with CPAP treatment 08/14/2017   Excessive daytime sleepiness 04/09/2017   Snoring 04/09/2017   Memory impairment of gradual onset 04/09/2017   Aortic atherosclerosis ([place]) 11/27/2016   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 11/11/2013   Altered mental status 03/05/2013    Respiratory failure, post-operative ([place]) 03/02/2013   Hepatitis B 02/24/2013   Cancer, hepatocellular ([place]) 02/10/2013   Liver tumor-bleeding 01/15/2013   Epidermal cyst 06/18/2012   OSA (obstructive sleep apnea) 03/13/2012   Abnormal leg movement 12/12/2011   Blood pressure elevated 09/06/2011   Diabetes mellitus ([place]) 09/06/2011   Narcolepsy 09/06/2011   BPH (benign prostatic hyperplasia) 09/06/2011   Diverticula of colon 09/06/2011    Conditions to be addressed/monitored: Dementia; Level of care concerns, Limited access to caregiver, and Memory Deficits  Care Plan : LCSW Plan of Care  Updates made by Edward Peer, LCSW since 03/06/2021 12:00 AM     Problem: Lack of knowledge for self management of long term care plans with cognitive impairment and inabilty to live independently   Priority: High     Long-Range Goal: Devlopment of long term care options/planning for patient with progressing cognitive impairment and inability to live independently   Start Date: 01/31/2021  Expected End Date: 04/19/2021  This Visit's Progress: On track  Recent Progress: On track  Priority: High  Note:   Current barriers:   Patient in need of assistance with connecting to community resources for Financial constraints related to obtaining Medicaid and/or placment at this time, Level of care concerns, Limited access to caregiver, Cognitive Deficits, and Lacks knowledge of community resources Acknowledges deficits with meeting this unmet need Patient is unable to independently navigate community resource options without care coordination support Clinical Goals:  patient will work with SW to address concerns related to probable ALF/memory care placement Patient/son will follow up with completion of selling pt's home and applying for Medicaid as directed by SW Clinical Interventions:  CSW made contact with pt's son, [name], who reports his father is in the hospital and awaiting placement; he  has not received any updates because the niece, [name], is HCPOA and hospital is working through her. CSW also attempted outreach call to [name] and left a HIPPA compliant voice mail.    Collaboration with Edward Agreste, MD regarding development and update of comprehensive plan of care as evidenced by provider attestation and co-signature Inter-disciplinary care team collaboration (see longitudinal plan of care) Assessment of needs, barriers , agencies contacted, as well as how impacting  Review various resources, discussed options and provided patient information about Financial constraints related to placement, Level of care concerns, Limited access to caregiver, and Cognitive Deficits Collaborated with appropriate clinical care team members regarding patient needs Financial constraints related to placement, Level of care concerns, Limited access to caregiver, and Lacks knowledge of community resources to aide in providing support and care to pt, HTN and Dementia Referral placed via [facility] Patient interviewed and appropriate assessments performed Assisted patient/caregiver with obtaining information about health plan benefits Provided education to patient/caregiver regarding level of care options. Other interventions provided: Solution-Focused Strategies, Active listening / Reflection utilized , Problem Solving [name] , Motivational Interviewing, Caregiver stress acknowledged , Consideration of in-home help encouraged , and will assist with continued work towards placement (with financial and medical steps)  Patient Goals:  -resolve financial barriers and apply for Medicaid -review list of Assisted Living facilities  to be mailed to you -research and tour facilities of interest -consider in-home support (private duty care)  - begin a notebook of services in my neighborhood or community - call 211 when I need some help - follow-up on any referrals for help I am given -  think ahead to make sure my need does not become an emergency - make a note about what I need to have by the phone or take with me, like an identification card or social security number have a back-up plan - have a back-up plan - make a list of family or friends that I can call  -  Follow Up Plan: Appointment scheduled for SW follow up with client by phone on:  03/20/21      Follow Up Plan: Appointment scheduled for SW follow up with client by phone on: 03/20/21      [name] MSW, LCSW Licensed Clinical Social Worker [employer] [phone]

## 2021-03-06 NOTE — Progress Notes (Addendum)
1pm: CSW spoke with Reeshema at [facility] who states that the facility could care for the patient's medical and cognitive needs but after completion of the assets check, it was revealed the patient owns several vehicles and his home. Reeshema requesting CSW speak with APS worker to discuss the financial situation.  CSW spoke with Talbert Forest of [place] APS to request she contact Judeen Hammans, business Glass blower/designer at [employer]. Bubba Hales stated agreement to contact the facility for further discussion.  9:30am:  CSW spoke with Reeshema at [facility] who is agreeable to review referral. CSW sent clinical information via fax for review.  Madilyn Fireman, MSW, LCSW Transitions of Care  Clinical Social Worker II [phone]

## 2021-03-07 DIAGNOSIS — F0390 Unspecified dementia without behavioral disturbance: Secondary | ICD-10-CM | POA: Diagnosis not present

## 2021-03-07 DIAGNOSIS — D61818 Other pancytopenia: Secondary | ICD-10-CM | POA: Diagnosis not present

## 2021-03-07 DIAGNOSIS — F0280 Dementia in other diseases classified elsewhere without behavioral disturbance: Secondary | ICD-10-CM | POA: Diagnosis not present

## 2021-03-07 DIAGNOSIS — I951 Orthostatic hypotension: Secondary | ICD-10-CM | POA: Diagnosis not present

## 2021-03-07 LAB — GLUCOSE, CAPILLARY
Glucose-Capillary: 107 mg/dL — ABNORMAL HIGH (ref 70–99)
Glucose-Capillary: 247 mg/dL — ABNORMAL HIGH (ref 70–99)
Glucose-Capillary: 178 mg/dL — ABNORMAL HIGH (ref 70–99)
Glucose-Capillary: 109 mg/dL — ABNORMAL HIGH (ref 70–99)

## 2021-03-07 MED ORDER — SODIUM CHLORIDE 0.9 % IV SOLN: INTRAVENOUS | Status: AC

## 2021-03-07 NOTE — Progress Notes (Signed)
Physical Therapy Treatment Patient Details Name: [name] MRN: [id number] DOB: 1946/03/11 Today's Date: 03/07/2021    History of Present Illness Pt is a 75 y.o. male admitted 7/1 for dehydration. He presented to the ED from home after wondering around outside all day and family unable to get him to come back inside. PMH significant for COVID+ 02/07/21, hepatitis B, cirrhosis, hepatocellular carcinoma, pancytopenia, type 2 diabetes mellitus, sleep apnea with CPAP intolerance, and dementia. Pt for IVC.    PT Comments    Patient received sitting at EOB with NT present, pleasant and cooperative this morning. Took orthostatics as below. Continued with gait training in hallway however was definitely more unsteady today- mostly able to gait train with min guard and SPC but did have multiple occasions of LOB to the L requiring MinA to steady. Limited awareness of deficits. Left up in recliner with all needs met, chair alarm active. Goals updated. Will continue to follow.     03/07/21 1026  Vital Signs  Patient Position (if appropriate) Orthostatic Vitals  Orthostatic Sitting  BP- Sitting 105/44  Orthostatic Standing at 0 minutes  BP- Standing at 0 minutes (!) 85/43  Orthostatic Standing at 3 minutes  BP- Standing at 3 minutes (!) 89/53   BP in chair post-gait 131/52   Follow Up Recommendations  SNF;Supervision/Assistance - 24 hour     Equipment Recommendations  None recommended by PT    Recommendations for Other Services       Precautions / Restrictions Precautions Precautions: Fall Precaution Comments: hears better out of R ear Restrictions Weight Bearing Restrictions: No    Mobility  Bed Mobility               General bed mobility comments: sitting at EOB with NT upon entry    Transfers Overall transfer level: Needs assistance Equipment used: None Transfers: Sit to/from Stand Sit to Stand: Supervision         General transfer comment: close S for  safety, increased time  Ambulation/Gait Ambulation/Gait assistance: Min assist Gait Distance (Feet): 225 Feet Assistive device: Straight cane Gait Pattern/deviations: Step-through pattern;Decreased stride length;Drifts right/left;Staggering left Gait velocity: decreased   General Gait Details: more unsteady today, able to gait train with min guard but did have multiple LOBs to the left requiring intermittent MinA for balance. Very distractable and became more unsteady than usual with head turns today.   Stairs             Wheelchair Mobility    Modified Rankin (Stroke Patients Only)       Balance Overall balance assessment: Needs assistance Sitting-balance support: No upper extremity supported;Feet supported Sitting balance-Leahy Scale: Normal     Standing balance support: No upper extremity supported;During functional activity;Single extremity supported Standing balance-Leahy Scale: Poor Standing balance comment: prefers UUE support, increased unsteadiness/LOB to L today                            Cognition Arousal/Alertness: Awake/alert Behavior During Therapy: Flat affect Overall Cognitive Status: No family/caregiver present to determine baseline cognitive functioning                                 General Comments: pleasantly confused but cooperative, able to participate in recall activities with PT. Does have reduced awareness of deficits- easily distractable and more unsteady today      Exercises  General Comments        Pertinent Vitals/Pain Pain Assessment: No/denies pain Faces Pain Scale: No hurt Pain Intervention(s): Limited activity within patient's tolerance;Monitored during session    Home Living                      Prior Function            PT Goals (current goals can now be found in the care plan section) Acute Rehab PT Goals Patient Stated Goal: not stated PT Goal Formulation: Patient unable to  participate in goal setting Time For Goal Achievement: 03/21/21 Potential to Achieve Goals: Fair Progress towards PT goals: Progressing toward goals    Frequency    Min 2X/week      PT Plan Current plan remains appropriate    Co-evaluation              AM-PAC PT "6 Clicks" Mobility   Outcome Measure  Help needed turning from your back to your side while in a flat bed without using bedrails?: None Help needed moving from lying on your back to sitting on the side of a flat bed without using bedrails?: None Help needed moving to and from a bed to a chair (including a wheelchair)?: None Help needed standing up from a chair using your arms (e.g., wheelchair or bedside chair)?: None Help needed to walk in hospital room?: A Little Help needed climbing 3-5 steps with a railing? : A Little 6 Click Score: 22    End of Session   Activity Tolerance: Patient tolerated treatment well Patient left: in chair;with call bell/phone within reach;with chair alarm set Nurse Communication: Mobility status PT Visit Diagnosis: Unsteadiness on feet (R26.81)     Time: 1002-1030 PT Time Calculation (min) (ACUTE ONLY): 28 min  Charges:  $Gait Training: 8-22 mins $Therapeutic Activity: 8-22 mins                    Windell Norfolk, DPT, PN1   Supplemental Physical Therapist [place]    Pager [phone] Acute Rehab Office [phone]

## 2021-03-07 NOTE — Progress Notes (Addendum)
PROGRESS NOTE    [name]  IWL:[id number] DOB: Dec 11, 1945 DOA: 02/17/2021 PCP: Wendie Agreste, MD   Chief Complaint  Patient presents with   Aggressive Behavior    Brief Narrative:   75 year old male with history of hepatitis B, cirrhosis, hepatocellular carcinoma, pancytopenia, diabetes, sleep apnea with CPAP intolerance, dementia who presents from home after he was found to be wandering outside and refusing to go back to home.  Patient was unable to contribute any history due to his dementia, disorientation.  On presentation he was tachycardic in the range of 130s, normalized with IV fluids.  As per the family, he he has been recently combative, wandering here and there and they have been working with a Education officer, museum with plan for nursing facility placement.  On presentation, lab work showed chronic pancytopenia, hemoglobin A1c of 8.6.  Patient was admitted for further management and unsafe discharge plan. -Significant for dehydration, mild AKI, baseline bicytopenia and mild hyperammonemia, which could be associated with his history of cirrhosis, hepatitis B/HCC.  Lab work showed pancytopenia, R74 and folic acid within normal limit, dehydration resolved with IV fluids, hyperammonemia improved with ammonia leve. -Patient He was IVCd by his son.  APS involved prior to admission and are assisting with discharge disposition.  He does not have any skilled needs therefore we are looking to place patient in an ALF memory care capabilities.  Disposition complicated by lack of funding to pay for ALF and eligibility for Medicaid given patient continues to own his own home    Assessment & Plan:   Principal Problem:   Dementia without behavioral disturbance (HCC) Active Problems:   OSA (obstructive sleep apnea)   Cancer, hepatocellular (HCC)   Memory impairment of gradual onset   Pancytopenia ([place])   Psychosis ([place])   Dehydration   Renal insufficiency   Malnutrition of moderate  degree    Dehydration/AKI: - Resolved after treatment with IV fluids. -Morning patient was noted to be orthostatic by PT, but he was asymptomatic, will keep on gentle hydration over next 24 hours.   Dementia with behavioral disturbance:  -Improved with the addition of Seroquel and citalopram this admission, continue with Aricept and Namenda - No longer meets requirements for IVC so will allow to expire - Plan is to discharge to ALF with memory care capabilities - APS involved prior to admission and assisting with placement needs, social worker is following closely, APS request  capacity evaluation by psych, so  consult has been requested. -I have discussed with son 7/19, report patient moved with him last February, but he was concerned about him given a forgetful when taking his medicine, has been missing his doctor appointments, and he has been having some hallucinations, did have couple falls, was wondering couple times outside the house) Owns home-not eligible for Medicaid   Elevated ammonia level:  Likely secondary to dehydration +/- Contributed to recent behavioral changes   History of hepatitis B/cirrhosis/hepatocellular carcinoma/transaminitis Follows with Dr. Learta Codding.   Status post partial left hepatectomy on 02/2013.  Has chronic pancytopenia suboptimal blood pressure secondary to cirrhosis Vitamin B12 and folate levels are normal with hemoglobin stable  Monitor LFTs, CBC intermittently.  Thrombocytopenia -Chronic, due to above,  at baseline   Type 2 diabetes mellitus: HgbA1c of 7.3 as of 08/2020.   Continue sliding scale insulin.   CBGs well controlled and less than 200   Moderate protein calorie malnutrition Nutrition Problem: Moderate Malnutrition Etiology: chronic illness (dementia) Signs/Symptoms: mild fat depletion, moderate fat  depletion, mild muscle depletion, moderate muscle depletion Interventions: Liberalize Diet, MVI, Glucerna shake, Magic cup   Patient was  found to have tick in the chest area, no rash noted, received 1 dose of 200 mg of doxycycline for tick prophylaxis 7/16.  DVT prophylaxis: SCDs, no chemical prophylaxis due to thrombocytopenia, patient was encouraged to ambulate. Code Status: Full Family Communication: None at bedside, I have discussed with son by phone 7/19 Disposition:   Status is: Observation    Dispo: The patient is from: Home              Anticipated d/c is to: SNF              Patient currently is medically stable to d/c.  Awaiting safe disposition plan and bed availability at long-term facility   Difficult to place patient Yes       Consultants:  Psychiatry in ER, another psychiatric consult requested 7/19   Subjective:  Patient himself denies any complaints today, he was noted to be orthostatic by ET,  objective: Vitals:   03/06/21 2333 03/07/21 0319 03/07/21 0759 03/07/21 1222  BP: (!) 98/49 (!) 94/42 (!) 85/47 (!) 102/52  Pulse: 72 65 68 73  Resp: 17 18 18 18   Temp: 98.3 F (36.8 C) 98.5 F (36.9 C) 97.8 F (36.6 C) 98.8 F (37.1 C)  TempSrc: Oral Oral Oral Oral  SpO2: 97% 95%  100%  Weight:      Height:       No intake or output data in the 24 hours ending 03/07/21 1408  Filed Weights   02/22/21 0355 02/25/21 0705 03/06/21 0346  Weight: 86 kg 81.3 kg 76.4 kg    Examination:  Awake Alert, Oriented X 3, hard of hearing, frail symmetrical Chest wall movement, Good air movement bilaterally, CTAB RRR,No Gallops,Rubs or new Murmurs, No Parasternal Heave +ve B.Sounds, Abd Soft, No tenderness, No rebound - guarding or rigidity. No Cyanosis, Clubbing or edema, No new Rash or bruise         Data Reviewed: I have personally reviewed following labs and imaging studies  CBC: Recent Labs  Lab 03/01/21 0457 03/04/21 0035  WBC 2.9* 2.7*  HGB 8.1* 8.6*  HCT 25.3* 26.5*  MCV 102.8* 101.5*  PLT 54* 55*    Basic Metabolic Panel: Recent Labs  Lab 03/01/21 0457 03/04/21 0035  NA  135 136  K 4.1 3.8  CL 110 111  CO2 22 21*  GLUCOSE 115* 143*  BUN 6* 8  CREATININE 1.06 1.01  CALCIUM 8.4* 8.5*  MG 1.7  --     GFR: Estimated Creatinine Clearance: 62.2 mL/min (by C-G formula based on SCr of 1.01 mg/dL).  Liver Function Tests: Recent Labs  Lab 03/01/21 0457 03/04/21 0035  AST 83* 87*  ALT 49* 54*  ALKPHOS 109 117  BILITOT 0.9 1.1  PROT 5.3* 5.7*  ALBUMIN 2.2* 2.3*    CBG: Recent Labs  Lab 03/06/21 1203 03/06/21 1645 03/06/21 2037 03/07/21 0758 03/07/21 1220  GLUCAP 154* 114* 144* 107* 247*     No results found for this or any previous visit (from the past 240 hour(s)).       Radiology Studies: No results found.      Scheduled Meds:  sodium chloride   Intravenous Once   citalopram  10 mg Oral Daily   donepezil  10 mg Oral QHS   feeding supplement (GLUCERNA SHAKE)  237 mL Oral TID BM   ferrous sulfate  325  mg Oral TID WC   insulin aspart  0-6 Units Subcutaneous TID WC   lactulose  30 g Oral TID   memantine  5 mg Oral BID   multivitamin with minerals  1 tablet Oral Daily   pravastatin  20 mg Oral QHS   QUEtiapine  50 mg Oral QHS   Continuous Infusions:  sodium chloride 75 mL/hr at 03/07/21 1144     LOS: 0 days     Phillips Climes, MD [facility]   To contact the attending provider between 7A-7P or the covering provider during after hours 7P-7A, please log into the web site [email or web address] and access using universal [place] password for that web site. If you do not have the password, please call the hospital operator.  03/07/2021, 2:08 PM
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
## 2021-03-07 NOTE — Progress Notes (Signed)
PT Cancellation Note  Patient Details Name: [name] MRN: [id number] DOB: Sep 23, 1945   Cancelled Treatment:    Reason Eval/Treat Not Completed: Patient at procedure or test/unavailable Currently speaking to psych MD. Will attempt to return if time/schedule allow.   Windell Norfolk, DPT, PN1   Supplemental Physical Therapist [facility]    Pager [phone] Acute Rehab Office [phone]

## 2021-03-07 NOTE — Progress Notes (Signed)
CSW spoke with Talbert Forest at [facility]PS who is requesting a capacity evaluation be completed by the psychiatry team.   CSW spoke with Dr. Waldron Labs to have consult placed.  Madilyn Fireman, MSW, LCSW Transitions of Care  Clinical Social Worker II [phone]

## 2021-03-08 DIAGNOSIS — F0280 Dementia in other diseases classified elsewhere without behavioral disturbance: Secondary | ICD-10-CM | POA: Diagnosis not present

## 2021-03-08 DIAGNOSIS — D61818 Other pancytopenia: Secondary | ICD-10-CM | POA: Diagnosis not present

## 2021-03-08 LAB — CBC
HCT: 26.5 % — ABNORMAL LOW (ref 39.0–52.0)
Hemoglobin: 8.4 g/dL — ABNORMAL LOW (ref 13.0–17.0)
MCH: 32.9 pg (ref 26.0–34.0)
MCHC: 31.7 g/dL (ref 30.0–36.0)
MCV: 103.9 fL — ABNORMAL HIGH (ref 80.0–100.0)
Platelets: 54 10*3/uL — ABNORMAL LOW (ref 150–400)
RBC: 2.55 MIL/uL — ABNORMAL LOW (ref 4.22–5.81)
RDW: 15.6 % — ABNORMAL HIGH (ref 11.5–15.5)
WBC: 2.9 10*3/uL — ABNORMAL LOW (ref 4.0–10.5)
nRBC: 0 % (ref 0.0–0.2)

## 2021-03-08 LAB — COMPREHENSIVE METABOLIC PANEL
ALT: 40 U/L (ref 0–44)
AST: 58 U/L — ABNORMAL HIGH (ref 15–41)
Albumin: 2.2 g/dL — ABNORMAL LOW (ref 3.5–5.0)
Alkaline Phosphatase: 114 U/L (ref 38–126)
Anion gap: 6 (ref 5–15)
BUN: 8 mg/dL (ref 8–23)
CO2: 22 mmol/L (ref 22–32)
Calcium: 8.4 mg/dL — ABNORMAL LOW (ref 8.9–10.3)
Chloride: 110 mmol/L (ref 98–111)
Creatinine, Ser: 0.97 mg/dL (ref 0.61–1.24)
GFR, Estimated: >60 mL/min (ref >60)
Glucose, Bld: 140 mg/dL — ABNORMAL HIGH (ref 70–99)
Potassium: 3.7 mmol/L (ref 3.5–5.1)
Sodium: 138 mmol/L (ref 135–145)
Total Bilirubin: 1.1 mg/dL (ref 0.3–1.2)
Total Protein: 5.6 g/dL — ABNORMAL LOW (ref 6.5–8.1)

## 2021-03-08 LAB — GLUCOSE, CAPILLARY
Glucose-Capillary: 92 mg/dL (ref 70–99)
Glucose-Capillary: 176 mg/dL — ABNORMAL HIGH (ref 70–99)
Glucose-Capillary: 125 mg/dL — ABNORMAL HIGH (ref 70–99)
Glucose-Capillary: 134 mg/dL — ABNORMAL HIGH (ref 70–99)

## 2021-03-08 NOTE — Progress Notes (Signed)
Nutrition Follow-up  DOCUMENTATION CODES:   Non-severe (moderate) malnutrition in context of chronic illness  INTERVENTION:   -Continue Glucerna Shake po TID, each supplement provides 220 kcal and 10 grams of protein  -Continue Magic cup TID with meals, each supplement provides 290 kcal and 9 grams of protein  -Continue MVI with minerals daily -Continue regular diet -Continue feeding assistance with meals  NUTRITION DIAGNOSIS:   Moderate Malnutrition related to chronic illness (dementia) as evidenced by mild fat depletion, moderate fat depletion, mild muscle depletion, moderate muscle depletion.  Ongoing  GOAL:   Patient will meet greater than or equal to 90% of their needs  Progressing   MONITOR:   PO intake, Supplement acceptance, Labs, Weight trends, Skin, I & O's  REASON FOR ASSESSMENT:   Malnutrition Screening Tool    ASSESSMENT:   [name] is a 75 y.o. male with medical history significant for hepatitis B, cirrhosis, hepatocellular carcinoma, pancytopenia, type 2 diabetes mellitus, sleep apnea with CPAP intolerance, and dementia, now presenting from home where he had been wandering outside all day and refusing to go back inside.  Patient is unable to contribute to the history due to his dementia and disorientation.  Heart rate was reportedly in the 130s with EMS initially and normalized with IV fluids prior to arrival in the ED.  Family reported that the patient has been wandering more recently, occasionally combative, and family has been working with Education officer, museum with plan for likely nursing facility placement.  Pt with variable intake. Noted meal completion 25-100%. Pt is consuming supplements per MAR.    Per MD notes, pt is medically stable for discharge and is awaiting placement.   Medications reviewed and include ferrous sulfate and lactulose.  Labs reviewed: CBGS: 92-247 (inpatient orders for glycemic control are 0-6 units insulin aspart TID).     Diet Order:   Diet Order             Diet regular Room service appropriate? Yes with Assist; Fluid consistency: Thin  Diet effective now                   EDUCATION NEEDS:   Not appropriate for education at this time  Skin:  Skin Assessment: Reviewed RN Assessment  Last BM:  03/05/21  Height:   Ht Readings from Last 1 Encounters:  02/25/21 5\' 9"  (1.753 m)    Weight:   Wt Readings from Last 1 Encounters:  03/06/21 76.4 kg    Ideal Body Weight:  72.7 kg  BMI:  Body mass index is 24.87 kg/m.  Estimated Nutritional Needs:   Kcal:  2200-2400  Protein:  120-135 grams  Fluid:  > 2 L    Loistine Chance, RD, LDN, [place] Registered Dietitian II Certified Diabetes Care and Education Specialist Please refer to [facility] for RD and/or RD on-call/weekend/after hours pager

## 2021-03-08 NOTE — Consult Note (Signed)
  Please do not hesitate to re-consult psychiatry for further eval. after further information regarding patient's POA and living situation have been further elucidated via [facility] consult in an attempt to gain collateral.  PGY-2 Damita Dunnings, MD

## 2021-03-08 NOTE — Progress Notes (Signed)
Occupational Therapy Treatment Patient Details Name: [name] MRN: [id number] DOB: 02-07-1946 Today's Date: 03/08/2021    History of present illness Pt is a 75 y.o. male admitted 7/1 for dehydration. He presented to the ED from home after wondering around outside all day and family unable to get him to come back inside. PMH significant for COVID+ 02/07/21, hepatitis B, cirrhosis, hepatocellular carcinoma, pancytopenia, type 2 diabetes mellitus, sleep apnea with CPAP intolerance, and dementia. Pt for IVC.   OT comments  Pt progressing towards acute OT goals. Completed 2 grooming tasks, seated. Suspect he needs cues for initiating daily grooming tasks and intermittent cues/assist for thoroughness. Pleasantly confused. Some delayed responses. Able to correctly state location and year "22." Used SPC to walk community distance (simulated in the halls) at min guard level. Poor sequencing and awareness of SPC; unsteadiness noted but no overt LOB. Difficulty dual tasking and tends to walk in the direction his head is turned. Up in recliner with chair alarm on, call bell within reach at end of session. D/c plan remains appropriate.    Follow Up Recommendations  SNF    Equipment Recommendations  Other (comment) (defer to next venue)    Recommendations for Other Services      Precautions / Restrictions Precautions Precautions: Fall Precaution Comments: hears better out of R ear Restrictions Weight Bearing Restrictions: No       Mobility Bed Mobility Overal bed mobility: Needs Assistance Bed Mobility: Supine to Sit     Supine to sit: Supervision     General bed mobility comments: HOB mostly flat. Supervision for safety.    Transfers Overall transfer level: Needs assistance Equipment used: Straight cane Transfers: Sit to/from Stand Sit to Stand: Supervision;Min guard         General transfer comment: supervision for safety, min guard from lower seat surfaces. Turned  prematurely on approach to recliner, cues for safety to back up until his legs touched recliner prior to sitting    Balance Overall balance assessment: Needs assistance Sitting-balance support: No upper extremity supported;Feet supported Sitting balance-Leahy Scale: Good     Standing balance support: Single extremity supported;No upper extremity supported;During functional activity Standing balance-Leahy Scale: Poor Standing balance comment: fair static standing balance, poor dynamic balance requiring up to single UE support during dynamic tasks.                           ADL either performed or assessed with clinical judgement   ADL Overall ADL's : Needs assistance/impaired     Grooming: Set up;Sitting Grooming Details (indicate cue type and reason): Pt combed hair sitting EOB. a bit of decreased thoroughness but did comb 85% of his hair. Sat at sink to brush teeth. Suspect he needs cues to initiate daily grooming tasks.                             Functional mobility during ADLs: Min guard;Cane General ADL Comments: Pt asleep upon arrival of OT. Easily awoke and agreeable to OOB activity. Completed 2 grooming tasks sitting. Cues at times for thoroughness. Likely needs cues to initiate daily grooming tasks. Pt then completed household distance functional mobility at min guard level partially using a cane. Poor sequencing of movements with cane but no LOB. Tendency to veer to either side while walking; walks in the direction his head is facing.     Vision  Perception     Praxis      Cognition Arousal/Alertness: Awake/alert Behavior During Therapy: Flat affect;WFL for tasks assessed/performed Overall Cognitive Status: No family/caregiver present to determine baseline cognitive functioning                                 General Comments: With a little extra time pt able to state [facility] for location. "22" for the year. Easily  distracted. Some sequencing difficulties. Difficulty dual tasking. Pleasantly confused. Some tangential responses but difficulty to full assess d/t HOH.        Exercises     Shoulder Instructions       General Comments      Pertinent Vitals/ Pain       Pain Assessment: No/denies pain  Home Living                                          Prior Functioning/Environment              Frequency  Min 2X/week        Progress Toward Goals  OT Goals(current goals can now be found in the care plan section)  Progress towards OT goals: Progressing toward goals  Acute Rehab OT Goals Patient Stated Goal: not stated OT Goal Formulation: Patient unable to participate in goal setting Time For Goal Achievement: 03/17/21 Potential to Achieve Goals: Good ADL Goals Additional ADL Goal #1: Pt will increase to modified independence of OOB ADL with <2 cues for sequencing. Additional ADL Goal #2: Pt will perform sustained attention task i.e. coloring, drawing, word find with minimal cues to attend to task x5 mins to increase activity tolerance.  Plan Discharge plan remains appropriate    Co-evaluation                 AM-PAC OT "6 Clicks" Daily Activity     Outcome Measure   Help from another person eating meals?: None Help from another person taking care of personal grooming?: A Little Help from another person toileting, which includes using toliet, bedpan, or urinal?: A Little Help from another person bathing (including washing, rinsing, drying)?: A Little Help from another person to put on and taking off regular upper body clothing?: None Help from another person to put on and taking off regular lower body clothing?: A Little 6 Click Score: 20    End of Session Equipment Utilized During Treatment: Other (comment) (SPC)  OT Visit Diagnosis: Unsteadiness on feet (R26.81);Muscle weakness (generalized) (M62.81)   Activity Tolerance Patient tolerated  treatment well   Patient Left in chair;with call bell/phone within reach;with chair alarm set   Nurse Communication Mobility status        Time: 6948-5462 OT Time Calculation (min): 22 min  Charges: OT General Charges $OT Visit: 1 Visit OT Treatments $Self Care/Home Management : 8-22 mins  Tyrone Schimke, OT Acute Rehabilitation Services Pager: [phone] Office: [phone]    Hortencia Pilar 03/08/2021, 12:18 PM

## 2021-03-08 NOTE — Progress Notes (Signed)
PROGRESS NOTE    [name]  YHC:[id number] DOB: [birth year] DOA: 02/17/2021 PCP: Wendie Agreste, MD   Chief Complaint  Patient presents with   Aggressive Behavior    Brief Narrative:   75 year old male with history of hepatitis B, cirrhosis, hepatocellular carcinoma, pancytopenia, diabetes, sleep apnea with CPAP intolerance, dementia who presents from home after he was found to be wandering outside and refusing to go back to home.  Patient was unable to contribute any history due to his dementia, disorientation.  On presentation he was tachycardic in the range of 130s, normalized with IV fluids.  As per the family, he he has been recently combative, wandering here and there and they have been working with a Education officer, museum with plan for nursing facility placement.  On presentation, lab work showed chronic pancytopenia, hemoglobin A1c of 8.6.  Patient was admitted for further management and unsafe discharge plan. -Significant for dehydration, mild AKI, baseline bicytopenia and mild hyperammonemia, which could be associated with his history of cirrhosis, hepatitis B/HCC.  Lab work showed pancytopenia, D17 and folic acid within normal limit, dehydration resolved with IV fluids, hyperammonemia improved with ammonia leve. -Patient He was IVCd by his son.  APS involved prior to admission and are assisting with discharge disposition.  He does not have any skilled needs therefore we are looking to place patient in an ALF memory care capabilities.  Disposition complicated by lack of funding to pay for ALF and eligibility for Medicaid given patient continues to own his own home    Assessment & Plan:   Principal Problem:   Dementia without behavioral disturbance (HCC) Active Problems:   OSA (obstructive sleep apnea)   Cancer, hepatocellular (HCC)   Memory impairment of gradual onset   Pancytopenia ([place])   Psychosis ([place])   Dehydration   Renal insufficiency   Malnutrition of moderate  degree    Dehydration/AKI: - Resolved after treatment with IV fluids. -Morning patient was noted to be orthostatic by PT, but he was asymptomatic, did receive gentle IV hydration on 7/19.    Dementia with behavioral disturbance:  -Improved with the addition of Seroquel and citalopram this admission, continue with Aricept and Namenda - No longer meets requirements for IVC so will allow to expire - Plan is to discharge to ALF with memory care capabilities - APS involved prior to admission and assisting with placement needs, social worker is following closely, APS request  capacity evaluation by psych, so psychiatry consult has been requested.  I have seen and evaluated the patient 7/19, and patient has capacity. -I have discussed with son 7/19, report patient moved with him last February, but he was concerned about him given a forgetful when taking his medicine, has been missing his doctor appointments, and he has been having some hallucinations, did have couple falls, was wondering couple times outside the house) Owns home-not eligible for Medicaid -Patient is agreeable to placement, awaiting SNF bed availability -Patient extremely hard of hearing, might give some impression of dementia as he does hear his questions appropriately   Elevated ammonia level:  Likely secondary to dehydration +/- Contributed to recent behavioral changes   History of hepatitis B/cirrhosis/hepatocellular carcinoma/transaminitis Follows with Dr. Learta Codding.   Status post partial left hepatectomy on 02/2013.  Has chronic pancytopenia suboptimal blood pressure secondary to cirrhosis Vitamin B12 and folate levels are normal with hemoglobin stable  Monitor LFTs, CBC intermittently.  Thrombocytopenia -Chronic, due to above,  at baseline   Type 2 diabetes mellitus: HgbA1c of  7.3 as of 08/2020.   Continue sliding scale insulin.   CBGs well controlled and less    Moderate protein calorie malnutrition Nutrition  Problem: Moderate Malnutrition Etiology: chronic illness (dementia) Signs/Symptoms: mild fat depletion, moderate fat depletion, mild muscle depletion, moderate muscle depletion Interventions: Liberalize Diet, MVI, Glucerna shake, Magic cup   Patient was found to have tick in the chest area, no rash noted, received 1 dose of 200 mg of doxycycline for tick prophylaxis 7/16.  DVT prophylaxis: SCDs, no chemical prophylaxis due to thrombocytopenia, patient was encouraged to ambulate. Code Status: Full Family Communication: None at bedside, I have discussed with son by phone 7/19 Disposition:   Status is: Observation    Dispo: The patient is from: Home              Anticipated d/c is to: SNF              Patient currently is medically stable to d/c.  Awaiting safe disposition plan and bed availability at long-term facility   Difficult to place patient Yes       Consultants:  Psychiatry in ER, another psychiatric consult requested 7/19   Subjective:  Patient denies any complaints today, no dizziness, no lightheadedness    objective: Vitals:   03/08/21 0111 03/08/21 0340 03/08/21 0758 03/08/21 1210  BP: (!) 102/50 106/64 (!) 107/58 (!) 105/53  Pulse: 65 69 66 70  Resp: 19 18 18 20   Temp: 98.5 F (36.9 C) 97.8 F (36.6 C) 97.7 F (36.5 C) 98 F (36.7 C)  TempSrc: Oral Oral Oral Oral  SpO2: 96% 100% 100% 100%  Weight:      Height:        Intake/Output Summary (Last 24 hours) at 03/08/2021 1500 Last data filed at 03/08/2021 1151 Gross per 24 hour  Intake 1772.54 ml  Output --  Net 1772.54 ml    Filed Weights   02/22/21 0355 02/25/21 0705 03/06/21 0346  Weight: 86 kg 81.3 kg 76.4 kg    Examination:  Awake Alert, Oriented X 3, extremely hard of hearing, slow to respond, but overall appropriate Symmetrical Chest wall movement, Good air movement bilaterally, CTAB RRR,No Gallops,Rubs or new Murmurs, No Parasternal Heave +ve B.Sounds, Abd Soft, No tenderness, No  rebound - guarding or rigidity. No Cyanosis, Clubbing or edema, No new Rash or bruise          Data Reviewed: I have personally reviewed following labs and imaging studies  CBC: Recent Labs  Lab 03/04/21 0035 03/08/21 0044  WBC 2.7* 2.9*  HGB 8.6* 8.4*  HCT 26.5* 26.5*  MCV 101.5* 103.9*  PLT 55* 54*    Basic Metabolic Panel: Recent Labs  Lab 03/04/21 0035 03/08/21 0044  NA 136 138  K 3.8 3.7  CL 111 110  CO2 21* 22  GLUCOSE 143* 140*  BUN 8 8  CREATININE 1.01 0.97  CALCIUM 8.5* 8.4*    GFR: Estimated Creatinine Clearance: 64.8 mL/min (by C-G formula based on SCr of 0.97 mg/dL).  Liver Function Tests: Recent Labs  Lab 03/04/21 0035 03/08/21 0044  AST 87* 58*  ALT 54* 40  ALKPHOS 117 114  BILITOT 1.1 1.1  PROT 5.7* 5.6*  ALBUMIN 2.3* 2.2*    CBG: Recent Labs  Lab 03/07/21 1220 03/07/21 1549 03/07/21 2108 03/08/21 0756 03/08/21 1226  GLUCAP 247* 178* 109* 92 176*     No results found for this or any previous visit (from the past 240 hour(s)).  Radiology Studies: No results found.      Scheduled Meds:  sodium chloride   Intravenous Once   citalopram  10 mg Oral Daily   donepezil  10 mg Oral QHS   feeding supplement (GLUCERNA SHAKE)  237 mL Oral TID BM   ferrous sulfate  325 mg Oral TID WC   insulin aspart  0-6 Units Subcutaneous TID WC   lactulose  30 g Oral TID   memantine  5 mg Oral BID   multivitamin with minerals  1 tablet Oral Daily   pravastatin  20 mg Oral QHS   QUEtiapine  50 mg Oral QHS   Continuous Infusions:     LOS: 0 days     Phillips Climes, MD [facility]   To contact the attending provider between 7A-7P or the covering provider during after hours 7P-7A, please log into the web site [email or web address] and access using universal [place] password for that web site. If you do not have the password, please call the hospital operator.  03/08/2021, 3:00 PM

## 2021-03-08 NOTE — Progress Notes (Addendum)
CSW spoke with Amada Jupiter at [facility] who states the facility is communicating with assigned Medicaid worker at [facility] to discuss patient's financials prior to accepting him into the facility.  CSW informed Talbert Forest of [facility] of results from psychiatry evaluation that was completed yesterday and provided her with a copy. Bubba Hales states she will assist the patient's HCPOA with obtaining hearing aids for the patient.  Madilyn Fireman, MSW, LCSW Transitions of Care  Clinical Social Worker II [phone]

## 2021-03-09 ENCOUNTER — Ambulatory Visit: Payer: Medicare Other | Admitting: Family Medicine

## 2021-03-09 DIAGNOSIS — I951 Orthostatic hypotension: Secondary | ICD-10-CM

## 2021-03-09 DIAGNOSIS — F0280 Dementia in other diseases classified elsewhere without behavioral disturbance: Secondary | ICD-10-CM | POA: Diagnosis not present

## 2021-03-09 DIAGNOSIS — D61818 Other pancytopenia: Secondary | ICD-10-CM | POA: Diagnosis not present

## 2021-03-09 LAB — GLUCOSE, CAPILLARY
Glucose-Capillary: 148 mg/dL — ABNORMAL HIGH (ref 70–99)
Glucose-Capillary: 155 mg/dL — ABNORMAL HIGH (ref 70–99)
Glucose-Capillary: 112 mg/dL — ABNORMAL HIGH (ref 70–99)
Glucose-Capillary: 160 mg/dL — ABNORMAL HIGH (ref 70–99)

## 2021-03-09 NOTE — Progress Notes (Signed)
[facility] PROGRESS NOTE  [name] JOA:[id number] DOB: Oct 01, 1945 DOA: 02/17/2021 PCP: Wendie Agreste, MD  Status: The patient remains OBS appropriate and will d/c before 2 midnights. Admitted with preadmission IVC in place.  No longer meets requirements for IVC so we will let expire  Dispo: The patient is from: Home              Anticipated d/c is to: ALF with memory care-if unable to find appropriate bed may need to ask APS to reevaluate patient and home situation to determine if it would be safe to discharge him back home with family              Patient currently is medically stable to d/c.  Difficult to place patient Yes   Level of care: Med-Surg  Code Status: Full Family Communication:  DVT prophylaxis: SCD COVID vaccination status: Pfizer 10/03/2019, 10/28/2019, and booster 06/28/2020  HPI: 75 year old male with history of hepatitis B, cirrhosis, hepatocellular carcinoma, pancytopenia, diabetes, sleep apnea with CPAP intolerance, dementia who presents from home after he was found to be wandering outside and refusing to go back to home.  Patient was unable to contribute any history due to his dementia, disorientation.  On presentation he was tachycardic in the range of 130s, normalized with IV fluids.  As per the family, he he has been recently combative, wandering here and there and they have been working with a Education officer, museum with plan for nursing facility placement.  On presentation, lab work showed chronic pancytopenia, hemoglobin A1c of 8.6.  Patient was admitted for further management and unsafe discharge plan. Patient was started on IV fluids for mild AKI.  Lab work showed pancytopenia, we have ordered vitamin S01 and folic acid level.  Hemoglobin is currently stable.  His ammonia level was found to be high which could be associated with his history of cirrhosis, hepatitis B/HCC,started on lactulose.  He was IVCd by his son.  APS involved prior to admission and are  assisting with discharge disposition.  He does not have any skilled needs therefore we are looking to place patient in an ALF memory care capabilities.  Disposition complicated by lack of funding to pay for ALF and eligibility for Medicaid given patient continues to own his own home  Subjective: Alert.  Discussed with patient plans to discontinue Aricept secondary to low blood pressure readings when walking.  Patient verbalized understanding.  He agrees to placement after discharge.   Objective: Vitals:   03/09/21 0450 03/09/21 0817  BP: (!) 103/57 (!) 96/55  Pulse: 68 67  Resp: 19 18  Temp: 98.2 F (36.8 C) 97.9 F (36.6 C)  SpO2: 99% 100%    Intake/Output Summary (Last 24 hours) at 03/09/2021 0824 Last data filed at 03/08/2021 1151 Gross per 24 hour  Intake 1772.54 ml  Output --  Net 1772.54 ml   Filed Weights   02/22/21 0355 02/25/21 0705 03/06/21 0346  Weight: 86 kg 81.3 kg 76.4 kg    Exam:  Constitutional: Awake, no acute distress, pleasant affect Respiratory: Lungs are clear, stable on room air Cardiovascular: Normal heart sounds, suboptimal blood pressure readings with SBP in the 90s-100s, regular pulse without tachycardia, no peripheral edema Abdomen:  LBM 7/17, soft nontender nondistended with normoactive bowel sounds.  Eating well. Neurological: Cranial nerves intact except for he is hearing loss.  Ambulates without difficulty or gait disturbance. Psychiatric: Alert and oriented x3.  Very minimal short-term memory deficits.  Pleasant affect.  Assessment/Plan: Acute  problems:  Dehydration/AKI:  Resolved after treatment with IV fluids Creatinine has decreased from 1.38 to 0.96   Dementia with behavioral disturbance:  Improved with the addition of Seroquel and citalopram this admission IVC expired and does not meet criteria to continue Plan is to discharge to ALF/SNF with memory care capabilities APS involved prior to admission requested psychiatric consultation  for capacity. Per psychiatry note on 7/19 they were unable to determine capacity.  Last cognitive evaluation revealed only mild cognitive impairment.  Hearing loss also contributing to inability to obtain accurate exam per psych provider.  Psychiatry team recommends SW speak with patient's niece to clarify history in regards to patient's concerns over family not providing adequate care and inappropriate use of patient's finances. Patient is agreeable to discharge to a facility since he agrees he is not receiving adequate care in the private home environment.  Orthostatic hypotension OVS positive 7/19 -DC Aricept and rpt OVS 7/22   Elevated ammonia level:  Likely secondary to dehydration +/- Contributed to recent behavioral changes   History of hepatitis B/cirrhosis/hepatocellular carcinoma:  Follows with Dr. Learta Codding.   Status post partial left hepatectomy on 02/2013.  Has chronic pancytopenia suboptimal blood pressure secondary to cirrhosis Vitamin B12 and folate levels are normal with hemoglobin stable at 8.1   Type 2 diabetes mellitus:  HgbA1c of 7.3 as of 08/2020.   Continue sliding scale insulin.   CBGs well controlled and less than 200  Moderate protein calorie malnutrition Nutrition Problem: Moderate Malnutrition Etiology: chronic illness (dementia) Signs/Symptoms: mild fat depletion, moderate fat depletion, mild muscle depletion, moderate muscle depletion Interventions: Liberalize Diet, MVI, Glucerna shake, Magic cup Body mass index is 24.87 kg/m.      Data Reviewed: Basic Metabolic Panel: Recent Labs  Lab 03/04/21 0035 03/08/21 0044  NA 136 138  K 3.8 3.7  CL 111 110  CO2 21* 22  GLUCOSE 143* 140*  BUN 8 8  CREATININE 1.01 0.97  CALCIUM 8.5* 8.4*   Liver Function Tests: Recent Labs  Lab 03/04/21 0035 03/08/21 0044  AST 87* 58*  ALT 54* 40  ALKPHOS 117 114  BILITOT 1.1 1.1  PROT 5.7* 5.6*  ALBUMIN 2.3* 2.2*   No results for input(s): LIPASE, AMYLASE  in the last 168 hours. No results for input(s): AMMONIA in the last 168 hours.  CBC: Recent Labs  Lab 03/04/21 0035 03/08/21 0044  WBC 2.7* 2.9*  HGB 8.6* 8.4*  HCT 26.5* 26.5*  MCV 101.5* 103.9*  PLT 55* 54*   Cardiac Enzymes: No results for input(s): CKTOTAL, CKMB, CKMBINDEX, TROPONINI in the last 168 hours. BNP (last 3 results) Recent Labs    02/25/21 0749  BNP 89.1    ProBNP (last 3 results) No results for input(s): PROBNP in the last 8760 hours.  CBG: Recent Labs  Lab 03/08/21 0756 03/08/21 1226 03/08/21 1644 03/08/21 2006 03/09/21 0816  GLUCAP 92 176* 125* 134* 148*    No results found for this or any previous visit (from the past 240 hour(s)).   Studies: No results found.  Scheduled Meds:  sodium chloride   Intravenous Once   citalopram  10 mg Oral Daily   donepezil  10 mg Oral QHS   feeding supplement (GLUCERNA SHAKE)  237 mL Oral TID BM   ferrous sulfate  325 mg Oral TID WC   insulin aspart  0-6 Units Subcutaneous TID WC   lactulose  30 g Oral TID   memantine  5 mg Oral BID   multivitamin  with minerals  1 tablet Oral Daily   pravastatin  20 mg Oral QHS   QUEtiapine  50 mg Oral QHS   Continuous Infusions:  Principal Problem:   Dementia without behavioral disturbance (HCC) Active Problems:   OSA (obstructive sleep apnea)   Cancer, hepatocellular (HCC)   Memory impairment of gradual onset   Pancytopenia (HCC)   Psychosis ([place])   Dehydration   Renal insufficiency   Malnutrition of moderate degree   Consultants: Psychiatry while in the ER  Procedures: None  Antibiotics: Anti-infectives (From admission, onward)    Start     Dose/Rate Route Frequency Ordered Stop   03/04/21 1330  doxycycline (VIBRA-TABS) tablet 200 mg        200 mg Oral  Once 03/04/21 1244 03/04/21 1332          Time spent: 15 minutes    Erin Hearing ANP  [facility] 7 am - 330 pm/M-F for direct patient care and secure chat Please refer to Amion  for contact info 0  days

## 2021-03-10 DIAGNOSIS — F0280 Dementia in other diseases classified elsewhere without behavioral disturbance: Secondary | ICD-10-CM | POA: Diagnosis not present

## 2021-03-10 DIAGNOSIS — R413 Other amnesia: Secondary | ICD-10-CM | POA: Diagnosis not present

## 2021-03-10 DIAGNOSIS — D61818 Other pancytopenia: Secondary | ICD-10-CM | POA: Diagnosis not present

## 2021-03-10 LAB — GLUCOSE, CAPILLARY
Glucose-Capillary: 94 mg/dL (ref 70–99)
Glucose-Capillary: 179 mg/dL — ABNORMAL HIGH (ref 70–99)
Glucose-Capillary: 135 mg/dL — ABNORMAL HIGH (ref 70–99)
Glucose-Capillary: 139 mg/dL — ABNORMAL HIGH (ref 70–99)

## 2021-03-10 NOTE — Progress Notes (Signed)
Physical Therapy Treatment Patient Details Name: [name] MRN: [id number] DOB: 1945/10/30 Today's Date: 03/10/2021    History of Present Illness Pt is a 75 y.o. male admitted 7/1 for dehydration. He presented to the ED from home after wondering around outside all day and family unable to get him to come back inside. PMH significant for COVID+ 02/07/21, hepatitis B, cirrhosis, hepatocellular carcinoma, pancytopenia, type 2 diabetes mellitus, sleep apnea with CPAP intolerance, and dementia. Pt for IVC.    PT Comments    Patient received in bed, pleasant and cooperative today. Rechecked orthostatic BPs as per MD order as follows:     03/10/21 0933  Orthostatic Lying   BP- Lying 119/54  Orthostatic Sitting  BP- Sitting 102/62  Orthostatic Standing at 0 minutes  BP- Standing at 0 minutes 107/52  Orthostatic Standing at 3 minutes  BP- Standing at 3 minutes 113/58    Otherwise able to progress mobility nicely today- much more steady with SPC, although easily distracted and did require cues for navigation in hallway. Very pleasant today. Left up in recliner with all needs met, chair alarm active. Will continue to follow.   Follow Up Recommendations  SNF;Supervision/Assistance - 24 hour     Equipment Recommendations  None recommended by PT    Recommendations for Other Services       Precautions / Restrictions Precautions Precautions: Fall Precaution Comments: hears better out of R ear Restrictions Weight Bearing Restrictions: No    Mobility  Bed Mobility Overal bed mobility: Needs Assistance Bed Mobility: Supine to Sit     Supine to sit: Min assist     General bed mobility comments: MinA from flat bed to help swing hips around and bring trunk completely upright    Transfers Overall transfer level: Needs assistance Equipment used: Straight cane Transfers: Sit to/from Stand Sit to Stand: Supervision         General transfer comment: S for safety, wide  BOS and increased time  Ambulation/Gait Ambulation/Gait assistance: Min guard Gait Distance (Feet): 300 Feet Assistive device: Straight cane Gait Pattern/deviations: Step-through pattern;Trunk flexed Gait velocity: decreased   General Gait Details: much more steady today with SPC, does tend to turn in the same direction as his head and easily distracted but no LOb today.   Stairs             Wheelchair Mobility    Modified Rankin (Stroke Patients Only)       Balance Overall balance assessment: Needs assistance Sitting-balance support: No upper extremity supported;Feet supported Sitting balance-Leahy Scale: Good     Standing balance support: Single extremity supported;During functional activity Standing balance-Leahy Scale: Fair                              Cognition Arousal/Alertness: Awake/alert Behavior During Therapy: Flat affect;WFL for tasks assessed/performed Overall Cognitive Status: No family/caregiver present to determine baseline cognitive functioning                                 General Comments: easily distracted but very pleasant. Did become a bit disoriented to where he was on the unit while performing multiple laps in hallway and needed cues for navigation.      Exercises      General Comments        Pertinent Vitals/Pain Pain Assessment: No/denies pain Faces Pain Scale: No hurt Pain  Intervention(s): Limited activity within patient's tolerance;Monitored during session    Home Living                      Prior Function            PT Goals (current goals can now be found in the care plan section) Acute Rehab PT Goals Patient Stated Goal: not stated PT Goal Formulation: Patient unable to participate in goal setting Time For Goal Achievement: 03/21/21 Potential to Achieve Goals: Fair Progress towards PT goals: Progressing toward goals    Frequency    Min 2X/week      PT Plan Current plan  remains appropriate    Co-evaluation              AM-PAC PT "6 Clicks" Mobility   Outcome Measure  Help needed turning from your back to your side while in a flat bed without using bedrails?: None Help needed moving from lying on your back to sitting on the side of a flat bed without using bedrails?: A Little Help needed moving to and from a bed to a chair (including a wheelchair)?: None Help needed standing up from a chair using your arms (e.g., wheelchair or bedside chair)?: None Help needed to walk in hospital room?: A Little Help needed climbing 3-5 steps with a railing? : A Little 6 Click Score: 21    End of Session Equipment Utilized During Treatment: Gait belt Activity Tolerance: Patient tolerated treatment well Patient left: in chair;with call bell/phone within reach;with chair alarm set Nurse Communication: Mobility status PT Visit Diagnosis: Unsteadiness on feet (R26.81)     Time: 1657-9038 PT Time Calculation (min) (ACUTE ONLY): 31 min  Charges:  $Gait Training: 8-22 mins $Therapeutic Activity: 8-22 mins                    Windell Norfolk, DPT, PN1   Supplemental Physical Therapist [place]    Pager [phone] Acute Rehab Office [phone]

## 2021-03-10 NOTE — Progress Notes (Signed)
TOC CM referral to assist with getting information on Medicaid application. Spoke Ms Gloriann Loan at [facility] and states pt did not have a pending application for Medicaid in [place]. Information given to [facility] CSW to follow up with financial counselor and [employer]. [place], [place] [facility] TOC CM [phone]

## 2021-03-10 NOTE — Progress Notes (Addendum)
[facility] PROGRESS NOTE  [name] [id number] DOB: 06/22/1946 DOA: 02/17/2021 PCP: Wendie Agreste, MD  Status: The patient remains OBS appropriate and will d/c before 2 midnights. Admitted with preadmission IVC in place which has expired.  Dispo: The patient is from: Home              Anticipated d/c is to: ALF vs SNF.  Psych testing patient have capacity              Patient currently is medically stable to d/c.  Difficult to place patient Yes   Level of care: Med-Surg  Code Status: Full Family Communication:  DVT prophylaxis: SCD COVID vaccination status: Pfizer 10/03/2019, 10/28/2019, and booster 06/28/2020  HPI: 75 year old male with history of hepatitis B, cirrhosis, hepatocellular carcinoma, pancytopenia, diabetes, sleep apnea with CPAP intolerance, dementia who presents from home after he was found to be wandering outside and refusing to go back to home.  Patient was unable to contribute any history due to his dementia, disorientation.  On presentation he was tachycardic in the range of 130s, normalized with IV fluids.  As per the family, he he has been recently combative, wandering here and there and they have been working with a Education officer, museum with plan for nursing facility placement.  On presentation, lab work showed chronic pancytopenia, hemoglobin A1c of 8.6.  Patient was admitted for further management and unsafe discharge plan. Patient was started on IV fluids for mild AKI.  Lab work showed pancytopenia, we have ordered vitamin 123456 and folic acid level.  Hemoglobin is currently stable.  His ammonia level was found to be high which could be associated with his history of cirrhosis, hepatitis B/HCC,started on lactulose.  He was IVCd by his son.  APS involved prior to admission and are assisting with discharge disposition.  He does not have any skilled needs therefore we are looking to place patient in an ALF memory care capabilities.  Disposition complicated by  lack of funding to pay for ALF and eligibility for Medicaid given patient continues to own his own home  Subjective: Had questions regarding process for admission to SNF and expectations after arrival.  Deferred to SW   Objective: Vitals:   03/10/21 0408 03/10/21 0735  BP: (!) 113/52 108/60  Pulse:  67  Resp:  19  Temp:  98.5 F (36.9 C)  SpO2:  100%    Intake/Output Summary (Last 24 hours) at 03/10/2021 0759 Last data filed at 03/09/2021 1652 Gross per 24 hour  Intake 540 ml  Output --  Net 540 ml   Filed Weights   02/22/21 0355 02/25/21 0705 03/06/21 0346  Weight: 86 kg 81.3 kg 76.4 kg    Exam:  Constitutional: No acute distress, calm Respiratory: Lungs are clear, stable on room air with normal pulse oximetry readings when checked Cardiovascular: S1-S2, no tachycardia, normotensive and previous orthostasis has resolved and initially had lower blood pressures but then recovered-no further blood pressures dipping into the 80s with standing Abdomen:  LBM 7/17, soft nontender nondistended with normoactive bowel sounds.  Eating well. Neurological: Cranial nerves intact except for he is hearing loss.  Ambulates without difficulty or gait disturbance. Psychiatric: Oriented x3, pleasant affect.  Assessment/Plan: Acute problems:  Dehydration/AKI:  Resolved after treatment with IV fluids Creatinine has decreased from 1.38 to 0.96   Dementia with behavioral disturbance:  Improved with the addition of Seroquel and citalopram this admission Plan is to discharge to ALF/SNF with memory care capabilities APS involved  prior to admission requested psychiatric consultation for capacity. Per psychiatry note on 7/19 supervising psychiatrist felt patient had capacity to make decisions.  He also reported to the same physician that his power of attorney is not providing him food.  Last cognitive evaluation revealed only mild cognitive impairment.  Hearing loss also contributing to inability  to obtain accurate exam per psych provider.  Psychiatry team recommends SW speak with patient's niece to clarify history in regards to patient's concerns over family not providing adequate care and inappropriate use of patient's finances.  As of 7/22 SW has had difficulty obtaining contact information/getting in touch with APS worker/case worker Patient is agreeable to discharge to a facility since he agrees he is not receiving adequate care in the private home environment.  Orthostatic hypotension OVS positive 7/19 -Aricept was continued on 7/21 and rpt OVS  WNL on 7/22   Elevated ammonia level:  Likely secondary to dehydration +/- Contributed to recent behavioral changes   History of hepatitis B/cirrhosis/hepatocellular carcinoma:  Follows with Dr. Learta Codding.   Status post partial left hepatectomy on 02/2013.  Has chronic pancytopenia suboptimal blood pressure secondary to cirrhosis Vitamin B12 and folate levels are normal with hemoglobin stable at 8.1   Type 2 diabetes mellitus:  HgbA1c of 7.3 as of 08/2020.   Continue sliding scale insulin.   CBGs well controlled and less than 200  Moderate protein calorie malnutrition Nutrition Problem: Moderate Malnutrition Etiology: chronic illness (dementia) Signs/Symptoms: mild fat depletion, moderate fat depletion, mild muscle depletion, moderate muscle depletion Interventions: Liberalize Diet, MVI, Glucerna shake, Magic cup Body mass index is 24.87 kg/m.      Data Reviewed: Basic Metabolic Panel: Recent Labs  Lab 03/04/21 0035 03/08/21 0044  NA 136 138  K 3.8 3.7  CL 111 110  CO2 21* 22  GLUCOSE 143* 140*  BUN 8 8  CREATININE 1.01 0.97  CALCIUM 8.5* 8.4*   Liver Function Tests: Recent Labs  Lab 03/04/21 0035 03/08/21 0044  AST 87* 58*  ALT 54* 40  ALKPHOS 117 114  BILITOT 1.1 1.1  PROT 5.7* 5.6*  ALBUMIN 2.3* 2.2*   No results for input(s): LIPASE, AMYLASE in the last 168 hours. No results for input(s): AMMONIA in  the last 168 hours.  CBC: Recent Labs  Lab 03/04/21 0035 03/08/21 0044  WBC 2.7* 2.9*  HGB 8.6* 8.4*  HCT 26.5* 26.5*  MCV 101.5* 103.9*  PLT 55* 54*   Cardiac Enzymes: No results for input(s): CKTOTAL, CKMB, CKMBINDEX, TROPONINI in the last 168 hours. BNP (last 3 results) Recent Labs    02/25/21 0749  BNP 89.1    ProBNP (last 3 results) No results for input(s): PROBNP in the last 8760 hours.  CBG: Recent Labs  Lab 03/09/21 0816 03/09/21 1202 03/09/21 1649 03/09/21 2046 03/10/21 0734  GLUCAP 148* 155* 112* 160* 94    No results found for this or any previous visit (from the past 240 hour(s)).   Studies: No results found.  Scheduled Meds:  sodium chloride   Intravenous Once   citalopram  10 mg Oral Daily   feeding supplement (GLUCERNA SHAKE)  237 mL Oral TID BM   ferrous sulfate  325 mg Oral TID WC   insulin aspart  0-6 Units Subcutaneous TID WC   lactulose  30 g Oral TID   memantine  5 mg Oral BID   multivitamin with minerals  1 tablet Oral Daily   pravastatin  20 mg Oral QHS   QUEtiapine  50 mg Oral QHS   Continuous Infusions:  Principal Problem:   Dementia without behavioral disturbance (HCC) Active Problems:   OSA (obstructive sleep apnea)   Cancer, hepatocellular (HCC)   Memory impairment of gradual onset   Pancytopenia (HCC)   Psychosis (HCC)   Dehydration   Renal insufficiency   Malnutrition of moderate degree   Consultants: Psychiatry while in the ER  Procedures: None  Antibiotics: Anti-infectives (From admission, onward)    Start     Dose/Rate Route Frequency Ordered Stop   03/04/21 1330  doxycycline (VIBRA-TABS) tablet 200 mg        200 mg Oral  Once 03/04/21 1244 03/04/21 1332          Time spent: 15 minutes    Erin Hearing ANP  [facility] 7 am - 330 pm/M-F for direct patient care and secure chat Please refer to Amion for contact info 0  days

## 2021-03-10 NOTE — Progress Notes (Signed)
CSW attempted to obtain contact information for patient's Medicaid worker from his [place] worker Ms. Williams without success.   CSW attempted to obtain Medicaid worker's contact information from Network engineer at [place] - according to Network engineer, the worker is Doctor, hospital but she does not have direct phone number. DSS secretary placed CSW in queue for a return call from worker.  CSW attempted to reach [place] in the business office at [facility] without success.  Madilyn Fireman, MSW, LCSW Transitions of Care  Clinical Social Worker II [phone]

## 2021-03-11 DIAGNOSIS — E860 Dehydration: Secondary | ICD-10-CM | POA: Diagnosis not present

## 2021-03-11 LAB — GLUCOSE, CAPILLARY
Glucose-Capillary: 108 mg/dL — ABNORMAL HIGH (ref 70–99)
Glucose-Capillary: 153 mg/dL — ABNORMAL HIGH (ref 70–99)
Glucose-Capillary: 185 mg/dL — ABNORMAL HIGH (ref 70–99)
Glucose-Capillary: 141 mg/dL — ABNORMAL HIGH (ref 70–99)

## 2021-03-11 NOTE — Progress Notes (Signed)
[facility] PROGRESS NOTE  [name] [id number] DOB: 1946-07-13 DOA: 02/17/2021 PCP: Edward Agreste, MD  Status: The patient remains OBS appropriate and will d/c before 2 midnights. Admitted with preadmission IVC in place which has expired.  Dispo: The patient is from: Home              Anticipated d/c is to: ALF vs SNF.  Psych testing patient have capacity              Patient currently is medically stable to d/c.  Difficult to place patient Yes   Level of care: Med-Surg  Code Status: Full Family Communication:  DVT prophylaxis: SCD COVID vaccination status: Pfizer 10/03/2019, 10/28/2019, and booster 06/28/2020  HPI: 75 year old male with history of hepatitis B, cirrhosis, hepatocellular carcinoma, pancytopenia, diabetes, sleep apnea with CPAP intolerance, dementia who presents from home after he was found to be wandering outside and refusing to go back to home.  Patient was unable to contribute any history due to his dementia, disorientation.  On presentation he was tachycardic in the range of 130s, normalized with IV fluids.  As per the family, he he has been recently combative, wandering here and there and they have been working with a Education officer, museum with plan for nursing facility placement.  On presentation, lab work showed chronic pancytopenia, hemoglobin A1c of 8.6.  Patient was admitted for further management and unsafe discharge plan. Patient was started on IV fluids for mild AKI.  Lab work showed pancytopenia, we have ordered vitamin 123456 and folic acid level.  Hemoglobin is currently stable.  His ammonia level was found to be high which could be associated with his history of cirrhosis, hepatitis B/HCC,started on lactulose.  He was IVCd by his son.  APS involved prior to admission and are assisting with discharge disposition.  He does not have any skilled needs therefore we are looking to place patient in an ALF memory care capabilities.  Disposition complicated by  lack of funding to pay for ALF and eligibility for Medicaid given patient continues to own his own home  Subjective: Seen in his room.  Sitting up eating his breakfast.  He is hard of hearing but has no new complaints.   Objective: Vitals:   03/11/21 0740 03/11/21 1210  BP: 108/60 (!) 90/48  Pulse: 70 71  Resp: 18 18  Temp: 97.7 F (36.5 C) 98.2 F (36.8 C)  SpO2: 100% 96%    Intake/Output Summary (Last 24 hours) at 03/11/2021 1501 Last data filed at 03/10/2021 2300 Gross per 24 hour  Intake 360 ml  Output --  Net 360 ml   Filed Weights   02/22/21 0355 02/25/21 0705 03/06/21 0346  Weight: 86 kg 81.3 kg 76.4 kg    Exam:  Constitutional: No acute distress, calm Respiratory: Lungs are clear, stable on room air with normal pulse oximetry readings when checked Cardiovascular: S1-S2, no tachycardia, normotensive and previous orthostasis has resolved and initially had lower blood pressures but then recovered-no further blood pressures dipping into the 80s with standing Abdomen:  LBM 7/17, soft nontender nondistended with normoactive bowel sounds.  Eating well. Neurological: Cranial nerves intact except for he is hearing loss.  Ambulates without difficulty or gait disturbance. Psychiatric: Oriented x3, pleasant affect.  Assessment/Plan: Acute problems:  Dehydration/AKI:  Resolved after treatment with IV fluids Creatinine has decreased from 1.38 to 0.96   Dementia with behavioral disturbance:  Improved with the addition of Seroquel and citalopram this admission Plan is to discharge  to ALF/SNF with memory care capabilities APS involved prior to admission requested psychiatric consultation for capacity. Per psychiatry note on 7/19 supervising psychiatrist felt patient had capacity to make decisions.  He also reported to the same physician that his power of attorney is not providing him food.  Last cognitive evaluation revealed only mild cognitive impairment.  Hearing loss also  contributing to inability to obtain accurate exam per psych provider.  Psychiatry team recommends SW speak with patient's niece to clarify history in regards to patient's concerns over family not providing adequate care and inappropriate use of patient's finances.  As of 7/22 SW has had difficulty obtaining contact information/getting in touch with APS worker/case worker Patient is agreeable to discharge to a facility since he agrees he is not receiving adequate care in the private home environment.  Orthostatic hypotension OVS positive 7/19 -Aricept was continued on 7/21 and rpt OVS  WNL on 7/22   Elevated ammonia level:  Likely secondary to dehydration +/- Contributed to recent behavioral changes   History of hepatitis B/cirrhosis/hepatocellular carcinoma:  Follows with Dr. Learta [name].   Status post partial left hepatectomy on 02/2013.  Has chronic pancytopenia suboptimal blood pressure secondary to cirrhosis Vitamin B12 and folate levels are normal with hemoglobin stable at 8.1   Type 2 diabetes mellitus:  HgbA1c of 7.3 as of 08/2020.   Continue sliding scale insulin.   CBGs well controlled and less than 200  Moderate protein calorie malnutrition Nutrition Problem: Moderate Malnutrition Etiology: chronic illness (dementia) Signs/Symptoms: mild fat depletion, moderate fat depletion, mild muscle depletion, moderate muscle depletion Interventions: Liberalize Diet, MVI, Glucerna shake, Magic cup Body mass index is 24.87 kg/m.      Data Reviewed: Basic Metabolic Panel: Recent Labs  Lab 03/08/21 0044  NA 138  K 3.7  CL 110  CO2 22  GLUCOSE 140*  BUN 8  CREATININE 0.97  CALCIUM 8.4*   Liver Function Tests: Recent Labs  Lab 03/08/21 0044  AST 58*  ALT 40  ALKPHOS 114  BILITOT 1.1  PROT 5.6*  ALBUMIN 2.2*   No results for input(s): LIPASE, AMYLASE in the last 168 hours. No results for input(s): AMMONIA in the last 168 hours.  CBC: Recent Labs  Lab 03/08/21 0044   WBC 2.9*  HGB 8.4*  HCT 26.5*  MCV 103.9*  PLT 54*   Cardiac Enzymes: No results for input(s): CKTOTAL, CKMB, CKMBINDEX, TROPONINI in the last 168 hours. BNP (last 3 results) Recent Labs    02/25/21 0749  BNP 89.1    ProBNP (last 3 results) No results for input(s): PROBNP in the last 8760 hours.  CBG: Recent Labs  Lab 03/10/21 1217 03/10/21 1607 03/10/21 2052 03/11/21 0738 03/11/21 1208  GLUCAP 179* 135* 139* 108* 153*    No results found for this or any previous visit (from the past 240 hour(s)).   Studies: No results found.  Scheduled Meds:  sodium chloride   Intravenous Once   citalopram  10 mg Oral Daily   feeding supplement (GLUCERNA SHAKE)  237 mL Oral TID BM   ferrous sulfate  325 mg Oral TID WC   insulin aspart  0-6 Units Subcutaneous TID WC   lactulose  30 g Oral TID   memantine  5 mg Oral BID   multivitamin with minerals  1 tablet Oral Daily   pravastatin  20 mg Oral QHS   QUEtiapine  50 mg Oral QHS   Continuous Infusions:  Principal Problem:   Dementia without behavioral disturbance (  [place]) Active Problems:   OSA (obstructive sleep apnea)   Cancer, hepatocellular (HCC)   Memory impairment of gradual onset   Pancytopenia ([place])   Psychosis ([place])   Dehydration   Renal insufficiency   Malnutrition of moderate degree   Consultants: Psychiatry while in the ER  Procedures: None  Antibiotics: Anti-infectives (From admission, onward)    Start     Dose/Rate Route Frequency Ordered Stop   03/04/21 1330  doxycycline (VIBRA-TABS) tablet 200 mg        200 mg Oral  Once 03/04/21 1244 03/04/21 1332          Time spent: 15 minutes    Kayleen Memos MD  [facility] 7 am - 330 pm/M-F for direct patient care and secure chat Please refer to Amion for contact info 0  days

## 2021-03-12 DIAGNOSIS — E860 Dehydration: Secondary | ICD-10-CM | POA: Diagnosis not present

## 2021-03-12 LAB — GLUCOSE, CAPILLARY
Glucose-Capillary: 161 mg/dL — ABNORMAL HIGH (ref 70–99)
Glucose-Capillary: 208 mg/dL — ABNORMAL HIGH (ref 70–99)
Glucose-Capillary: 223 mg/dL — ABNORMAL HIGH (ref 70–99)
Glucose-Capillary: 198 mg/dL — ABNORMAL HIGH (ref 70–99)

## 2021-03-12 NOTE — Progress Notes (Signed)
[facility] PROGRESS NOTE  [name] [id number] DOB: [birth year] DOA: 02/17/2021 PCP: Wendie Agreste, MD  Status: The patient remains OBS appropriate and will d/c before 2 midnights. Admitted with preadmission IVC in place which has expired.  Dispo: The patient is from: Home              Anticipated d/c is to: ALF vs SNF.  Psych testing patient have capacity              Patient currently is medically stable to d/c.  Difficult to place patient Yes   Level of care: Med-Surg  Code Status: Full Family Communication:  DVT prophylaxis: SCD COVID vaccination status: Pfizer 10/03/2019, 10/28/2019, and booster 06/28/2020  HPI: 75 year old male with history of hepatitis B, cirrhosis, hepatocellular carcinoma, pancytopenia, diabetes, sleep apnea with CPAP intolerance, dementia who presents from home after he was found to be wandering outside and refusing to go back to home.  Patient was unable to contribute any history due to his dementia, disorientation.  On presentation he was tachycardic in the range of 130s, normalized with IV fluids.  As per the family, he he has been recently combative, wandering here and there and they have been working with a Education officer, museum with plan for nursing facility placement.  On presentation, lab work showed chronic pancytopenia, hemoglobin A1c of 8.6.  Patient was admitted for further management and unsafe discharge plan. Patient was started on IV fluids for mild AKI.  Lab work showed pancytopenia, we have ordered vitamin 123456 and folic acid level.  Hemoglobin is currently stable.  His ammonia level was found to be high which could be associated with his history of cirrhosis, hepatitis B/HCC,started on lactulose.  He was IVCd by his son.  APS involved prior to admission and are assisting with discharge disposition.  He does not have any skilled needs therefore we are looking to place patient in an ALF memory care capabilities.  Disposition complicated by  lack of funding to pay for ALF and eligibility for Medicaid given patient continues to own his own home  Subjective: Seen in his room.  Patient has no new complaints.   Objective: Vitals:   03/12/21 1256 03/12/21 1636  BP: (!) 101/55 (!) 97/54  Pulse: 73 75  Resp: 18 18  Temp: 98 F (36.7 C) 99.4 F (37.4 C)  SpO2: 100% 100%    Intake/Output Summary (Last 24 hours) at 03/12/2021 1823 Last data filed at 03/12/2021 0500 Gross per 24 hour  Intake 480 ml  Output --  Net 480 ml   Filed Weights   02/22/21 0355 02/25/21 0705 03/06/21 0346  Weight: 86 kg 81.3 kg 76.4 kg    Exam:  Constitutional: No acute distress, calm Respiratory: Lungs are clear, stable on room air with normal pulse oximetry readings when checked Cardiovascular: S1-S2, no tachycardia, normotensive and previous orthostasis has resolved and initially had lower blood pressures but then recovered-no further blood pressures dipping into the 80s with standing Abdomen:  LBM 7/17, soft nontender nondistended with normoactive bowel sounds.  Eating well. Neurological: Cranial nerves intact except for he is hearing loss.  Ambulates without difficulty or gait disturbance. Psychiatric: Oriented x3, pleasant affect.  Assessment/Plan: Acute problems:  Dehydration/AKI:  Resolved after treatment with IV fluids Creatinine has decreased from 1.38 to 0.96   Dementia with behavioral disturbance:  Improved with the addition of Seroquel and citalopram this admission Plan is to discharge to ALF/SNF with memory care capabilities APS involved prior to  admission requested psychiatric consultation for capacity. Per psychiatry note on 7/19 supervising psychiatrist felt patient had capacity to make decisions.  He also reported to the same physician that his power of attorney is not providing him food.  Last cognitive evaluation revealed only mild cognitive impairment.  Hearing loss also contributing to inability to obtain accurate exam  per psych provider.  Psychiatry team recommends SW speak with patient's niece to clarify history in regards to patient's concerns over family not providing adequate care and inappropriate use of patient's finances.  As of 7/22 SW has had difficulty obtaining contact information/getting in touch with APS worker/case worker Patient is agreeable to discharge to a facility since he agrees he is not receiving adequate care in the private home environment.  Orthostatic hypotension OVS positive 7/19 -Aricept was continued on 7/21 and rpt OVS  WNL on 7/22   Elevated ammonia level:  Likely secondary to dehydration +/- Contributed to recent behavioral changes   History of hepatitis B/cirrhosis/hepatocellular carcinoma:  Follows with Dr. Learta Codding.   Status post partial left hepatectomy on 02/2013.  Has chronic pancytopenia suboptimal blood pressure secondary to cirrhosis Vitamin B12 and folate levels are normal with hemoglobin stable at 8.1   Type 2 diabetes mellitus:  HgbA1c of 7.3 as of 08/2020.   Continue sliding scale insulin.   CBGs well controlled and less than 200  Moderate protein calorie malnutrition Nutrition Problem: Moderate Malnutrition Etiology: chronic illness (dementia) Signs/Symptoms: mild fat depletion, moderate fat depletion, mild muscle depletion, moderate muscle depletion Interventions: Liberalize Diet, MVI, Glucerna shake, Magic cup Body mass index is 24.87 kg/m.      Data Reviewed: Basic Metabolic Panel: Recent Labs  Lab 03/08/21 0044  NA 138  K 3.7  CL 110  CO2 22  GLUCOSE 140*  BUN 8  CREATININE 0.97  CALCIUM 8.4*   Liver Function Tests: Recent Labs  Lab 03/08/21 0044  AST 58*  ALT 40  ALKPHOS 114  BILITOT 1.1  PROT 5.6*  ALBUMIN 2.2*   No results for input(s): LIPASE, AMYLASE in the last 168 hours. No results for input(s): AMMONIA in the last 168 hours.  CBC: Recent Labs  Lab 03/08/21 0044  WBC 2.9*  HGB 8.4*  HCT 26.5*  MCV 103.9*   PLT 54*   Cardiac Enzymes: No results for input(s): CKTOTAL, CKMB, CKMBINDEX, TROPONINI in the last 168 hours. BNP (last 3 results) Recent Labs    02/25/21 0749  BNP 89.1    ProBNP (last 3 results) No results for input(s): PROBNP in the last 8760 hours.  CBG: Recent Labs  Lab 03/11/21 1642 03/11/21 2043 03/12/21 0754 03/12/21 1255 03/12/21 1635  GLUCAP 185* 141* 161* 208* 223*    No results found for this or any previous visit (from the past 240 hour(s)).   Studies: No results found.  Scheduled Meds:  sodium chloride   Intravenous Once   citalopram  10 mg Oral Daily   feeding supplement (GLUCERNA SHAKE)  237 mL Oral TID BM   ferrous sulfate  325 mg Oral TID WC   insulin aspart  0-6 Units Subcutaneous TID WC   lactulose  30 g Oral TID   memantine  5 mg Oral BID   multivitamin with minerals  1 tablet Oral Daily   pravastatin  20 mg Oral QHS   QUEtiapine  50 mg Oral QHS   Continuous Infusions:  Principal Problem:   Dementia without behavioral disturbance (HCC) Active Problems:   OSA (obstructive sleep apnea)  Cancer, hepatocellular ([place])   Memory impairment of gradual onset   Pancytopenia ([place])   Psychosis ([place])   Dehydration   Renal insufficiency   Malnutrition of moderate degree   Consultants: Psychiatry while in the ER  Procedures: None  Antibiotics: Anti-infectives (From admission, onward)    Start     Dose/Rate Route Frequency Ordered Stop   03/04/21 1330  doxycycline (VIBRA-TABS) tablet 200 mg        200 mg Oral  Once 03/04/21 1244 03/04/21 1332          Time spent: 15 minutes    Kayleen Memos MD  [facility] 7 am - 330 pm/M-F for direct patient care and secure chat Please refer to Amion for contact info 0  days

## 2021-03-13 DIAGNOSIS — F0280 Dementia in other diseases classified elsewhere without behavioral disturbance: Secondary | ICD-10-CM | POA: Diagnosis not present

## 2021-03-13 DIAGNOSIS — R413 Other amnesia: Secondary | ICD-10-CM | POA: Diagnosis not present

## 2021-03-13 LAB — GLUCOSE, CAPILLARY
Glucose-Capillary: 114 mg/dL — ABNORMAL HIGH (ref 70–99)
Glucose-Capillary: 178 mg/dL — ABNORMAL HIGH (ref 70–99)
Glucose-Capillary: 153 mg/dL — ABNORMAL HIGH (ref 70–99)
Glucose-Capillary: 109 mg/dL — ABNORMAL HIGH (ref 70–99)
Glucose-Capillary: 181 mg/dL — ABNORMAL HIGH (ref 70–99)

## 2021-03-13 NOTE — Progress Notes (Signed)
[facility] PROGRESS NOTE  [name] [id number] DOB: 21-Feb-1946 DOA: 02/17/2021 PCP: Wendie Agreste, MD  Status: The patient remains OBS appropriate and will d/c before 2 midnights. Admitted with preadmission IVC in place which has expired.  Dispo: The patient is from: Home              Anticipated d/c is to: ALF vs SNF.  Psych testing patient have capacity              Patient currently is medically stable to d/c.  Difficult to place patient Yes   Level of care: Med-Surg  Code Status: Full Family Communication:  DVT prophylaxis: SCD COVID vaccination status: Pfizer 10/03/2019, 10/28/2019, and booster 06/28/2020  HPI: 75 year old male with history of hepatitis B, cirrhosis, hepatocellular carcinoma, pancytopenia, diabetes, sleep apnea with CPAP intolerance, dementia who presents from home after he was found to be wandering outside and refusing to go back to home.  Patient was unable to contribute any history due to his dementia, disorientation.  On presentation he was tachycardic in the range of 130s, normalized with IV fluids.  As per the family, he he has been recently combative, wandering here and there and they have been working with a Education officer, museum with plan for nursing facility placement.  On presentation, lab work showed chronic pancytopenia, hemoglobin A1c of 8.6.  Patient was admitted for further management and unsafe discharge plan. Patient was started on IV fluids for mild AKI.  Lab work showed pancytopenia, we have ordered vitamin [id number] and folic acid level.  Hemoglobin is currently stable.  His ammonia level was found to be high which could be associated with his history of cirrhosis, hepatitis B/HCC,started on lactulose.  He was IVCd by his son.  APS involved prior to admission and are assisting with discharge disposition.  He does not have any skilled needs therefore we are looking to place patient in an ALF memory care capabilities.  Disposition complicated by  lack of funding to pay for ALF and eligibility for Medicaid given patient continues to own his own home  As of 7/25 APS social worker Bubba Hales confirmed that she has clarified preadmission history as follows: Although patient does have capacity and was clarified by psych he likely lacks competence noting that while at home with his son he persistently refused medications.  He became difficult to manage and requires 24/7 supervision which family is unable to provide.  At times he has been combative with the son although has not really demonstrated this behavior here while on different medications.  Subjective: Leaping soundly this morning so I did not awaken.   Objective: Vitals:   03/13/21 0334 03/13/21 0727  BP: (!) 104/51 (!) 108/50  Pulse: 73 70  Resp: 17 16  Temp: 98.9 F (37.2 C) 98.1 F (36.7 C)  SpO2: 99%    No intake or output data in the 24 hours ending 03/13/21 0834  Filed Weights   02/22/21 0355 02/25/21 0705 03/06/21 0346  Weight: 86 kg 81.3 kg 76.4 kg    Exam:  Constitutional: Sleeping soundly and appears calm and in no acute physical distress. Respiratory: Lung sounds are clear on room air on posterior exam. Cardiovascular: Heart sounds, normotensive, pulse not tachycardic Abdomen:  LBM 7/22, soft nontender nondistended.  Eating well Neurological: Baseline cranial nerves intact except for he is hearing loss.  Ambulates without difficulty or gait disturbance. Psychiatric: Sleeping soundly so did not assess today  Assessment/Plan: Acute problems:  Dehydration/AKI:  Resolved  after treatment with IV fluids Creatinine has decreased from 1.38 to 0.96   Dementia with behavioral disturbance:  Improved with the addition of Seroquel and citalopram this admission Plan is to discharge to ALF/SNF with memory care capabilities Aricept discontinued this admission due to orthostatic hypotension.  Continue Namenda for now. APS involved prior to admission requested  psychiatric consultation for capacity. Per psychiatry note on 7/19 supervising psychiatrist felt patient had capacity to make decisions.  He also reported to the same physician that his power of attorney is not providing him food.  Last cognitive evaluation revealed only mild cognitive impairment.   7/25 APS social worker was able to clarify preadmission history and although patient does have capacity as outlined by psych he does apparently lack competency noting behavioral issues regarding compliance with medications prior to admission, combative behaviors with son prior to admission these behaviors have not reemerged during this hospitalization and may be much better controlled now that he is on different medications.  They agree that given lack of ability of family to provide 24/7 care at home that he will need placement. Patient is agreeable to discharge to a facility since he agrees he is not receiving adequate care in the private home environment.  Orthostatic hypotension OVS positive 7/19 -Aricept was continued on 7/21 and rpt OVS  WNL on 7/22   Elevated ammonia level:  Likely secondary to dehydration +/- Contributed to recent behavioral changes   History of hepatitis B/cirrhosis/hepatocellular carcinoma:  Follows with Dr. Learta Codding.   Status post partial left hepatectomy on 02/2013.  Has chronic pancytopenia suboptimal blood pressure secondary to cirrhosis Vitamin B12 and folate levels are normal with hemoglobin stable at 8.1   Type 2 diabetes mellitus:  HgbA1c of 7.3 as of 08/2020.   Continue sliding scale insulin.   CBGs well controlled and less than 200  Moderate protein calorie malnutrition Nutrition Problem: Moderate Malnutrition Etiology: chronic illness (dementia) Signs/Symptoms: mild fat depletion, moderate fat depletion, mild muscle depletion, moderate muscle depletion Interventions: Liberalize Diet, MVI, Glucerna shake, Magic cup Body mass index is 24.87 kg/m.       Data Reviewed: Basic Metabolic Panel: Recent Labs  Lab 03/08/21 0044  NA 138  K 3.7  CL 110  CO2 22  GLUCOSE 140*  BUN 8  CREATININE 0.97  CALCIUM 8.4*   Liver Function Tests: Recent Labs  Lab 03/08/21 0044  AST 58*  ALT 40  ALKPHOS 114  BILITOT 1.1  PROT 5.6*  ALBUMIN 2.2*   No results for input(s): LIPASE, AMYLASE in the last 168 hours. No results for input(s): AMMONIA in the last 168 hours.  CBC: Recent Labs  Lab 03/08/21 0044  WBC 2.9*  HGB 8.4*  HCT 26.5*  MCV 103.9*  PLT 54*   Cardiac Enzymes: No results for input(s): CKTOTAL, CKMB, CKMBINDEX, TROPONINI in the last 168 hours. BNP (last 3 results) Recent Labs    02/25/21 0749  BNP 89.1    ProBNP (last 3 results) No results for input(s): PROBNP in the last 8760 hours.  CBG: Recent Labs  Lab 03/12/21 0754 03/12/21 1255 03/12/21 1635 03/12/21 2108 03/13/21 0735  GLUCAP 161* 208* 223* 198* 114*    No results found for this or any previous visit (from the past 240 hour(s)).   Studies: No results found.  Scheduled Meds:  sodium chloride   Intravenous Once   citalopram  10 mg Oral Daily   feeding supplement (GLUCERNA SHAKE)  237 mL Oral TID BM   ferrous  sulfate  325 mg Oral TID WC   insulin aspart  0-6 Units Subcutaneous TID WC   lactulose  30 g Oral TID   memantine  5 mg Oral BID   multivitamin with minerals  1 tablet Oral Daily   pravastatin  20 mg Oral QHS   QUEtiapine  50 mg Oral QHS   Continuous Infusions:  Principal Problem:   Dementia without behavioral disturbance (HCC) Active Problems:   OSA (obstructive sleep apnea)   Cancer, hepatocellular (HCC)   Memory impairment of gradual onset   Pancytopenia (HCC)   Psychosis ([place])   Dehydration   Renal insufficiency   Malnutrition of moderate degree   Consultants: Psychiatry while in the ER  Procedures: None  Antibiotics: Anti-infectives (From admission, onward)    Start     Dose/Rate Route Frequency Ordered  Stop   03/04/21 1330  doxycycline (VIBRA-TABS) tablet 200 mg        200 mg Oral  Once 03/04/21 1244 03/04/21 1332          Time spent: 15 minutes    Erin Hearing ANP  [facility] 7 am - 330 pm/M-F for direct patient care and secure chat Please refer to Amion for contact info 0  days

## 2021-03-13 NOTE — TOC Progression Note (Addendum)
Transition of Care [facility]) - Progression Note    Patient Details  Name: [name] MRN: [id number] Date of Birth: [birth year]  Transition of [facility]) CM/SW [place], RN Phone Number: 03/13/2021, 12:04 PM  Clinical Narrative:    Case management spoke with Berdie Ogren, CM at [facility] and the business office is discussing financials with Lesly Rubenstein, MSW with DSS APS at this time but has not determined a bed offer.  I called and left a message with Doreene Adas, DSS SW and asked for update regarding patient's pending placement at [facility].  I also called and left a message with Gwynneth Munson, business offer manager at [facility] to determine financials over patient's placement at the facility.  CM spoke with hospital leadership and [facility] and [facility] are going to review the patient's Clinicals for possible placement.  03/13/2021 1305 - I spoke with Talbert Forest, SW with APS DSS and the patient has pending Medicaid application and needs to find placement in an available SNF facility this week.  She has been unable to reach [employer], Scientist, physiological at [facility] to confirm bed offer at the facility.  I explained to her that I have reached out to [facility] and [facility] and have left messages at the facilities for possible bed offer.  The patient was placed in the hub for both [place] and [facility] to view at this time.  Expected Discharge Plan: Memory Care Barriers to Discharge: Inadequate or no insurance  Expected Discharge Plan and Services Expected Discharge Plan: Memory Care In-house Referral: Clinical Social Work Discharge Planning Services: CM Consult Post Acute Care Choice: Nursing Home Living arrangements for the past 2 months: Apartment                                       Social Determinants of Health (SDOH) Interventions    Readmission Risk Interventions No flowsheet data found.

## 2021-03-14 ENCOUNTER — Inpatient Hospital Stay: Payer: Medicare Other

## 2021-03-14 ENCOUNTER — Inpatient Hospital Stay: Payer: Medicare Other | Admitting: Oncology

## 2021-03-14 DIAGNOSIS — R413 Other amnesia: Secondary | ICD-10-CM | POA: Diagnosis not present

## 2021-03-14 LAB — GLUCOSE, CAPILLARY
Glucose-Capillary: 131 mg/dL — ABNORMAL HIGH (ref 70–99)
Glucose-Capillary: 181 mg/dL — ABNORMAL HIGH (ref 70–99)
Glucose-Capillary: 177 mg/dL — ABNORMAL HIGH (ref 70–99)
Glucose-Capillary: 109 mg/dL — ABNORMAL HIGH (ref 70–99)

## 2021-03-14 NOTE — Progress Notes (Signed)
Physical Therapy Treatment Patient Details Name: [name] MRN: [id number] DOB: 1945/12/11 Today's Date: 03/14/2021    History of Present Illness Pt is a 75 y.o. male admitted 7/1 for dehydration. He presented to the ED from home after wondering around outside all day and family unable to get him to come back inside. PMH significant for COVID+ 02/07/21, hepatitis B, cirrhosis, hepatocellular carcinoma, pancytopenia, type 2 diabetes mellitus, sleep apnea with CPAP intolerance, and dementia. Pt for IVC.    PT Comments    Patient received in bed, sleeping but easily woken and pleasant. Does tell me he is getting a little frustrated with being here in the hospital so long. Able to mobilize on a min guard level with SPC, but he chose to end gait early today as lunch arrived. Easily distracted, does need cues for safety on and off during session. Was confused as to what gait belt was while walking in the hallway. Left up in recliner with all needs met, chair alarm active. Will continue efforts.    Follow Up Recommendations  SNF;Supervision/Assistance - 24 hour     Equipment Recommendations  None recommended by PT    Recommendations for Other Services       Precautions / Restrictions Precautions Precautions: Fall Precaution Comments: hears better out of R ear Restrictions Weight Bearing Restrictions: No    Mobility  Bed Mobility Overal bed mobility: Needs Assistance Bed Mobility: Supine to Sit     Supine to sit: Mod assist     General bed mobility comments: ModA to come fully upright at EOB from flat bed due to poor core strength and sequencing    Transfers Overall transfer level: Needs assistance Equipment used: Straight cane Transfers: Sit to/from Stand Sit to Stand: Supervision         General transfer comment: S for safety, wide BOS and increased time  Ambulation/Gait Ambulation/Gait assistance: Min guard Gait Distance (Feet): 200 Feet Assistive device:  Straight cane Gait Pattern/deviations: Step-through pattern;Trunk flexed Gait velocity: decreased   General Gait Details: much more steady today with SPC, does tend to turn in the same direction as his head and easily distracted but no LOb today; pt chose to self-select gait distance due to lunch arriving   Stairs             Wheelchair Mobility    Modified Rankin (Stroke Patients Only)       Balance Overall balance assessment: Needs assistance Sitting-balance support: No upper extremity supported;Feet supported Sitting balance-Leahy Scale: Good     Standing balance support: Single extremity supported;During functional activity Standing balance-Leahy Scale: Fair                              Cognition Arousal/Alertness: Awake/alert Behavior During Therapy: Flat affect;WFL for tasks assessed/performed Overall Cognitive Status: No family/caregiver present to determine baseline cognitive functioning                                 General Comments: easily distracted, but pleasant      Exercises      General Comments        Pertinent Vitals/Pain Pain Assessment: No/denies pain Faces Pain Scale: No hurt Pain Intervention(s): Limited activity within patient's tolerance;Monitored during session    Home Living  Prior Function            PT Goals (current goals can now be found in the care plan section) Acute Rehab PT Goals Patient Stated Goal: not stated PT Goal Formulation: Patient unable to participate in goal setting Time For Goal Achievement: 03/21/21 Potential to Achieve Goals: Fair Progress towards PT goals: Progressing toward goals    Frequency    Min 2X/week      PT Plan Current plan remains appropriate    Co-evaluation              AM-PAC PT "6 Clicks" Mobility   Outcome Measure  Help needed turning from your back to your side while in a flat bed without using bedrails?:  None Help needed moving from lying on your back to sitting on the side of a flat bed without using bedrails?: A Lot Help needed moving to and from a bed to a chair (including a wheelchair)?: None Help needed standing up from a chair using your arms (e.g., wheelchair or bedside chair)?: None Help needed to walk in hospital room?: A Little Help needed climbing 3-5 steps with a railing? : A Little 6 Click Score: 20    End of Session Equipment Utilized During Treatment: Gait belt Activity Tolerance: Patient tolerated treatment well Patient left: in chair;with call bell/phone within reach;with chair alarm set Nurse Communication: Mobility status PT Visit Diagnosis: Unsteadiness on feet (R26.81)     Time: 4917-9150 PT Time Calculation (min) (ACUTE ONLY): 17 min  Charges:  $Gait Training: 8-22 mins                    Windell Norfolk, DPT, PN1   Supplemental Physical Therapist [place]    Pager [phone] Acute Rehab Office [phone]

## 2021-03-14 NOTE — Progress Notes (Signed)
Nutrition Follow-up  DOCUMENTATION CODES:   Non-severe (moderate) malnutrition in context of chronic illness  INTERVENTION:   -Continue Glucerna Shake po TID, each supplement provides 220 kcal and 10 grams of protein  -Continue Magic cup TID with meals, each supplement provides 290 kcal and 9 grams of protein  -Continue MVI with minerals daily -Continue regular diet -Continue feeding assistance with meals -RD will sign off due to continued medical stability; if further nutrition-related needs arise, please re-consult RD  NUTRITION DIAGNOSIS:   Moderate Malnutrition related to chronic illness (dementia) as evidenced by mild fat depletion, moderate fat depletion, mild muscle depletion, moderate muscle depletion.  Ongoing  GOAL:   Patient will meet greater than or equal to 90% of their needs  Progressing   MONITOR:   PO intake, Supplement acceptance, Labs, Weight trends, Skin, I & O's  REASON FOR ASSESSMENT:   Malnutrition Screening Tool    ASSESSMENT:   [name] is a 75 y.o. male with medical history significant for hepatitis B, cirrhosis, hepatocellular carcinoma, pancytopenia, type 2 diabetes mellitus, sleep apnea with CPAP intolerance, and dementia, now presenting from home where he had been wandering outside all day and refusing to go back inside.  Patient is unable to contribute to the history due to his dementia and disorientation.  Heart rate was reportedly in the 130s with EMS initially and normalized with IV fluids prior to arrival in the ED.  Family reported that the patient has been wandering more recently, occasionally combative, and family has been working with Education officer, museum with plan for likely nursing facility placement.  Reviewed I/O's: +477 ml x 24 hours and +4.8 L since 02/28/21  Pt with improved intake. Noted meal completion 20-100%. Pt is consuming supplements per MAR.     Per MD notes, pt is medically stable for discharge and is awaiting  placement. Due to ongoing nutritional and medical stability, RD will sign off. If further nutrition-related concerns arise, please re-consult RD.    Medications reviewed and include ferrous sulfate and lactulose.   Labs reviewed: CBGS: CBGS: 109-181 (inpatient orders for glycemic control are 0-6 units insulin aspart TID).    Diet Order:   Diet Order             Diet regular Room service appropriate? Yes with Assist; Fluid consistency: Thin  Diet effective now                   EDUCATION NEEDS:   Not appropriate for education at this time  Skin:  Skin Assessment: Reviewed RN Assessment  Last BM:  03/13/21  Height:   Ht Readings from Last 1 Encounters:  02/25/21 '5\' 9"'$  (1.753 m)    Weight:   Wt Readings from Last 1 Encounters:  03/06/21 76.4 kg    Ideal Body Weight:  72.7 kg  BMI:  Body mass index is 24.87 kg/m.  Estimated Nutritional Needs:   Kcal:  2200-2400  Protein:  120-135 grams  Fluid:  > 2 L    Loistine Chance, RD, LDN, [place] Registered Dietitian II Certified Diabetes Care and Education Specialist Please refer to [facility] for RD and/or RD on-call/weekend/after hours pager

## 2021-03-14 NOTE — Progress Notes (Signed)
Brief note: 75 year old male with history hepatitis B cirrhosis/hyper cell carcinoma, pancytopenia diabetes, sleep apnea with CPAP intolerance, dementia who was found wandering outside and refusing to go back to home brought to the ED.  Per family he has been combative recently and wandering her anger and they are working with Education officer, museum for nursing facility placement. Initially tachycardic in 130s improved with IV fluids.  Labs showed pancytopenia normal ammonia.  He was IVC by his son APS was involved prior to admission. On exam is alert awake oriented reports he has no place to go and waiting for facility.  He has no complaints S1-S2 present lungs clear bilaterally, no leg edema, able to move both lower extremities nonfocal.  Patient is managed for Orthostatic hypotension +7/19, Elevated ammonia level in the setting of cirrhosis History hepatitis B/cirrhosis /HCCfollow-up with Dr. Ammie Dalton T2DM A1c 7.3 Moderate protein calorie malnutrition Disposition - awaiting for skilled nursing facility at this time Discussed during multidisciplinary rounds reviewed LLOS note by nurse practitioner

## 2021-03-14 NOTE — Progress Notes (Signed)
[facility] PROGRESS NOTE  [name] [id number] DOB: [birth year] DOA: 02/17/2021 PCP: Wendie Agreste, MD  Status: The patient remains OBS appropriate and will d/c before 2 midnights. Admitted with preadmission IVC in place which has expired.  Dispo: The patient is from: Home              Anticipated d/c is to: ALF vs SNF.  Psych testing patient have capacity              Patient currently is medically stable to d/c.  Difficult to place patient Yes   Level of care: Med-Surg  Code Status: Full Family Communication:  DVT prophylaxis: SCD COVID vaccination status: Pfizer 10/03/2019, 10/28/2019, and booster 06/28/2020  HPI: 75 year old male with history of hepatitis B, cirrhosis, hepatocellular carcinoma, pancytopenia, diabetes, sleep apnea with CPAP intolerance, dementia who presents from home after he was found to be wandering outside and refusing to go back to home.  Patient was unable to contribute any history due to his dementia, disorientation.  On presentation he was tachycardic in the range of 130s, normalized with IV fluids.  As per the family, he he has been recently combative, wandering here and there and they have been working with a Education officer, museum with plan for nursing facility placement.  On presentation, lab work showed chronic pancytopenia, hemoglobin A1c of 8.6.  Patient was admitted for further management and unsafe discharge plan. Patient was started on IV fluids for mild AKI.  Lab work showed pancytopenia, we have ordered vitamin [id number] and folic acid level.  Hemoglobin is currently stable.  His ammonia level was found to be high which could be associated with his history of cirrhosis, hepatitis B/HCC,started on lactulose.  He was IVCd by his son.  APS involved prior to admission and are assisting with discharge disposition.  He does not have any skilled needs therefore we are looking to place patient in an ALF memory care capabilities.  Disposition complicated by  lack of funding to pay for ALF and eligibility for Medicaid given patient continues to own his own home  As of 7/25 APS social worker Bubba Hales confirmed that she has clarified preadmission history as follows: Although patient does have capacity and was clarified by psych he likely lacks competence noting that while at home with his son he persistently refused medications.  He became difficult to manage and requires 24/7 supervision which family is unable to provide.  At times he has been combative with the son although has not really demonstrated this behavior here while on different medications.  Subjective: Awaken from sleep.  No specific complaints verbalized.  Aware we are still awaiting bed offer for SNF or ALF.  Objective: Vitals:   03/13/21 2312 03/14/21 0434  BP: (!) 123/59 (!) 110/58  Pulse: 80 75  Resp: 16 16  Temp: 98.1 F (36.7 C) 98 F (36.7 C)  SpO2: 99% 99%    Intake/Output Summary (Last 24 hours) at 03/14/2021 0821 Last data filed at 03/13/2021 2300 Gross per 24 hour  Intake 477 ml  Output --  Net 477 ml    Filed Weights   02/22/21 0355 02/25/21 0705 03/06/21 0346  Weight: 86 kg 81.3 kg 76.4 kg    Exam:  Constitutional: No acute distress, pleasant, appears to be comfortable while supine in bed Respiratory: Anterior lung sounds remain clear to auscultation, he remained stable on room air with normal O2 saturations at rest Cardiovascular: S1-S2, normotensive, no peripheral edema, no tachycardia Abdomen:  LBM  7/25, soft nontender nondistended.  Eating well.  Normoactive bowel sounds. Neurological: Baseline cranial nerves intact except for he is hearing loss.  Ambulates without difficulty or gait disturbance. Psychiatric: Alert and oriented x3.  Pleasant affect.  Assessment/Plan: Acute problems:  Dehydration/AKI:  Resolved after treatment with IV fluids Creatinine decreased from 1.38 to 0.96   Dementia with behavioral disturbance:  Improved with the addition  of Seroquel and citalopram this admission Plan is to discharge to ALF/SNF with memory care capabilities Aricept discontinued this admission due to orthostatic hypotension.  Continue Namenda for now. APS involved prior to admission  Per psychiatry note on 7/19 supervising psychiatrist felt patient had capacity to make decisions.   Last cognitive evaluation revealed only mild cognitive impairment.   7/25 APS social worker was able to clarify preadmission history and although patient does have capacity as outlined by psych he does apparently lack behavioral competency ie noncompliant with medications prior to admission, combative behaviors with son prior to admission. These behaviors have not reemerged during this hospitalization and may be much better controlled now that he is on different medications.  APS agreed that given lack of ability of family to provide 24/7 care at home that he will need placement. Patient is agreeable to discharge to a facility   Orthostatic hypotension OVS positive 7/19 -Aricept was discontinued on 7/21 and rpt OVS  WNL on 7/22   History of hepatitis B/cirrhosis/hepatocellular carcinoma:  Follows with Dr. Learta Codding.   Status post partial left hepatectomy on 02/2013.  Has chronic pancytopenia suboptimal blood pressure secondary to cirrhosis Vitamin B12 and folate levels are normal with hemoglobin stable at 8.1   Type 2 diabetes mellitus:  HgbA1c of 7.3 as of 08/2020.   Continue sliding scale insulin.   CBGs well controlled and less than 200  Moderate protein calorie malnutrition Nutrition Problem: Moderate Malnutrition Etiology: chronic illness (dementia) Signs/Symptoms: mild fat depletion, moderate fat depletion, mild muscle depletion, moderate muscle depletion Interventions: Liberalize Diet, MVI, Glucerna shake, Magic cup Body mass index is 24.87 kg/m.      Data Reviewed: Basic Metabolic Panel: Recent Labs  Lab 03/08/21 0044  NA 138  K 3.7  CL 110   CO2 22  GLUCOSE 140*  BUN 8  CREATININE 0.97  CALCIUM 8.4*   Liver Function Tests: Recent Labs  Lab 03/08/21 0044  AST 58*  ALT 40  ALKPHOS 114  BILITOT 1.1  PROT 5.6*  ALBUMIN 2.2*   No results for input(s): LIPASE, AMYLASE in the last 168 hours. No results for input(s): AMMONIA in the last 168 hours.  CBC: Recent Labs  Lab 03/08/21 0044  WBC 2.9*  HGB 8.4*  HCT 26.5*  MCV 103.9*  PLT 54*   Cardiac Enzymes: No results for input(s): CKTOTAL, CKMB, CKMBINDEX, TROPONINI in the last 168 hours. BNP (last 3 results) Recent Labs    02/25/21 0749  BNP 89.1    ProBNP (last 3 results) No results for input(s): PROBNP in the last 8760 hours.  CBG: Recent Labs  Lab 03/13/21 1153 03/13/21 1533 03/13/21 1729 03/13/21 2015 03/14/21 0753  GLUCAP 178* 153* 109* 181* 131*    No results found for this or any previous visit (from the past 240 hour(s)).   Studies: No results found.  Scheduled Meds:  sodium chloride   Intravenous Once   citalopram  10 mg Oral Daily   feeding supplement (GLUCERNA SHAKE)  237 mL Oral TID BM   ferrous sulfate  325 mg Oral TID WC  insulin aspart  0-6 Units Subcutaneous TID WC   lactulose  30 g Oral TID   memantine  5 mg Oral BID   multivitamin with minerals  1 tablet Oral Daily   pravastatin  20 mg Oral QHS   QUEtiapine  50 mg Oral QHS   Continuous Infusions:  Principal Problem:   Dementia without behavioral disturbance (HCC) Active Problems:   OSA (obstructive sleep apnea)   Cancer, hepatocellular (HCC)   Memory impairment of gradual onset   Pancytopenia (HCC)   Psychosis ([place])   Dehydration   Renal insufficiency   Malnutrition of moderate degree   Consultants: Psychiatry while in the ER  Procedures: None  Antibiotics: Anti-infectives (From admission, onward)    Start     Dose/Rate Route Frequency Ordered Stop   03/04/21 1330  doxycycline (VIBRA-TABS) tablet 200 mg        200 mg Oral  Once 03/04/21 1244  03/04/21 1332          Time spent: 15 minutes    Erin Hearing ANP  [facility] 7 am - 330 pm/M-F for direct patient care and secure chat Please refer to Amion for contact info 0  days

## 2021-03-14 NOTE — TOC Progression Note (Addendum)
Transition of Care [facility]) - Progression Note    Patient Details  Name: AMIIR WOODING MRN: [id number] Date of Birth: 27-Jan-1946  Transition of Care [facility]) CM/SW [place], RN Phone Number: 03/14/2021, 9:31 AM  Clinical Narrative:    Case management called and left a message with [facility] [place], and [facility] skilled nursing facilities for exploration of bed offers and placement under the patient's pending Medicaid application.  CM and MSW continue to follow the patient for [facility] placement.  Talbert Forest, APS SW is following the patient for SNF placement assistance.  03/14/2021 0944 - CM called and left a message with Douglass Rivers, business office at [facility] to return my call regarding the patient's SNF placement.  03/14/2021 1342 - CM spoke with Talbert Forest APS SW and the patient's Medicaid application is active at this time and the patient is eligible to admit to a skilled nursing facility under his pending Medicaid application.  I asked that Talbert Forest call [place], [place] at [facility], [place], [place] with [place], [place] with [place] and [place] with [employer].  I have reached out to each of the SNF facilities but the patient does not have available bed offers for admission at this time.  Williams plans to call the facilities and speak with the CM at the facilities to answer questions regarding patient's qualifications and assets for admission.  03/14/2021 1429 - I spoke with Rushie Chestnut, APS on the phone and [place], [place] and [facility] are currently reviewing the patient for admission and will review assets as well.  Talbert Forest is calling the facility admission contacts to verify patient's qualifications for Medicaid and present assets.   Expected Discharge Plan: Memory Care Barriers to Discharge: Inadequate or no insurance  Expected Discharge Plan and Services Expected Discharge Plan: Memory  Care In-house Referral: Clinical Social Work Discharge Planning Services: CM Consult Post Acute Care Choice: Nursing Home Living arrangements for the past 2 months: Apartment                                       Social Determinants of Health (SDOH) Interventions    Readmission Risk Interventions No flowsheet data found.

## 2021-03-14 NOTE — Plan of Care (Signed)
  Problem: Clinical Measurements: Goal: Ability to maintain clinical measurements within normal limits will improve Outcome: Progressing Goal: Will remain free from infection Outcome: Progressing   Problem: Activity: Goal: Risk for activity intolerance will decrease Outcome: Progressing   Problem: Nutrition: Goal: Adequate nutrition will be maintained Outcome: Progressing   Problem: Safety: Goal: Ability to remain free from injury will improve Outcome: Progressing

## 2021-03-15 DIAGNOSIS — E119 Type 2 diabetes mellitus without complications: Secondary | ICD-10-CM

## 2021-03-15 DIAGNOSIS — F0280 Dementia in other diseases classified elsewhere without behavioral disturbance: Secondary | ICD-10-CM | POA: Diagnosis not present

## 2021-03-15 LAB — GLUCOSE, CAPILLARY
Glucose-Capillary: 135 mg/dL — ABNORMAL HIGH (ref 70–99)
Glucose-Capillary: 234 mg/dL — ABNORMAL HIGH (ref 70–99)
Glucose-Capillary: 133 mg/dL — ABNORMAL HIGH (ref 70–99)
Glucose-Capillary: 147 mg/dL — ABNORMAL HIGH (ref 70–99)

## 2021-03-15 MED ORDER — METFORMIN HCL 500 MG PO TABS
500.0000 mg | ORAL_TABLET | Freq: Two times a day (BID) | ORAL | Status: DC
  Administered 2021-03-15 – 2021-03-16 (×3): 500 mg via ORAL
  Filled 2021-03-15 (×3): qty 1

## 2021-03-15 NOTE — Therapy (Signed)
Occupational Therapy Treatment Patient Details Name: [name] MRN: [id number] DOB: 1946/07/12 Today's Date: 03/15/2021    History of present illness Pt is a 75 y.o. male admitted 7/1 for dehydration. He presented to the ED from home after wondering around outside all day and family unable to get him to come back inside. PMH significant for COVID+ 02/07/21, hepatitis B, cirrhosis, hepatocellular carcinoma, pancytopenia, type 2 diabetes mellitus, sleep apnea with CPAP intolerance, and dementia. Pt for IVC.   OT comments  Pt seen for OT ADL retraining session and assessment of acute OT goals, with focus on grooming including washing hair with shower cap, shaving; bathing/dressing UB, Bathing/dressing LB, functional mobility and transfers related to ADL's and self care. Pt is making slow gains toward acute OT goals, they remain appropriate and will be extended for this reason. Pt benefits from quiet environment and presentation of tasks 1 at a time as he can become easily distracted.   Follow Up Recommendations  SNF    Equipment Recommendations  Other (comment) (Defer to next venue)    Recommendations for Other Services      Precautions / Restrictions Precautions Precautions: Fall Precaution Comments: hears better out of R ear       Mobility Bed Mobility Overal bed mobility: Needs Assistance Bed Mobility: Supine to Sit     Supine to sit: Supervision;Min guard     General bed mobility comments: Min guard assist when coming fully upright to sit at EOB    Transfers Overall transfer level: Needs assistance Equipment used: Straight cane Transfers: Sit to/from Stand;Stand Pivot Transfers Sit to Stand: Supervision Stand pivot transfers: Min guard       General transfer comment: S for safety, wide BOS and increased time    Balance Overall balance assessment: Needs assistance Sitting-balance support: No upper extremity supported;Feet supported Sitting balance-Leahy  Scale: Good     Standing balance support: Single extremity supported;During functional activity Standing balance-Leahy Scale: Fair       ADL either performed or assessed with clinical judgement   ADL Overall ADL's : Needs assistance/impaired     Grooming: Wash/dry hands;Wash/dry face;Oral care;Applying deodorant;Brushing hair;Set up;Sitting (Sitting at sink) Grooming Details (indicate cue type and reason): Pt appeared to do well given increased time for tasks and VC here and there to initiate next task during ADL's (ie: Pt finished brushing teeth and then combed hair after comb was placed within his sight at sink). Upper Body Bathing: Set up;Sitting;Supervision/ safety (Pt bathed chest, arms w/ supervision/set-up, min A only for washing/drying his back.) Upper Body Bathing Details (indicate cue type and reason): see above Lower Body Bathing: Supervison/ safety;Set up;Cueing for sequencing;Sitting/lateral leans Lower Body Bathing Details (indicate cue type and reason): Pt was able to bathe LB, peri area in sitting today. Pt initiated this task after his gown was removed so OT could assist in washing his back. Upper Body Dressing : Set up;Min guard;Sitting   Lower Body Dressing: Min guard;Sit to/from Archivist:  (Pt declined tolieting tasks)     Functional mobility during ADLs: Min guard;Cane General ADL Comments: Pt asleep upon in bed upon OT arrival, agreeable to OOB ADL's at sink level. Pt participated in grooming, washing hair with shower cap, combing hair, shaving, UB/LB dressing given increased time. Pt was noted to initiate3 task when various items were placed on counter in his line of sight (ie: comb, razor, toothpaste, toothbrush etc, one at a time. Pt was noted to be easily distracted  with too much stimuli at once).     Cognition Arousal/Alertness: Awake/alert Behavior During Therapy: Flat affect;WFL for tasks assessed/performed Overall Cognitive Status: No  family/caregiver present to determine baseline cognitive functioning     General Comments: easily distracted, but pleasant              General Comments      Pertinent Vitals/ Pain       Pain Assessment: No/denies pain Pain Score: 0-No pain Faces Pain Scale: No hurt   Frequency  Min 2X/week        Progress Toward Goals  OT Goals(current goals can now be found in the care plan section)  Progress towards OT goals: Progressing toward goals (Goals assessed, pt making slow gains. Acute goals remain appropriate, will extend as pt is awaiting SNF placement.)  Acute Rehab OT Goals Patient Stated Goal: not stated OT Goal Formulation: Patient unable to participate in goal setting Time For Goal Achievement: 03/29/21 Potential to Achieve Goals: Good ADL Goals Additional ADL Goal #1: Pt will increase to modified independence of OOB ADL with <2 cues for sequencing. Additional ADL Goal #2: Pt will perform sustained attention task i.e. coloring, drawing, word find with minimal cues to attend to task x5 mins to increase activity tolerance.  Plan Discharge plan remains appropriate;Frequency remains appropriate;Other (comment) (Goals assessed)       AM-PAC OT "6 Clicks" Daily Activity     Outcome Measure   Help from another person eating meals?: None Help from another person taking care of personal grooming?: A Little Help from another person toileting, which includes using toliet, bedpan, or urinal?: A Little Help from another person bathing (including washing, rinsing, drying)?: A Little Help from another person to put on and taking off regular upper body clothing?: None Help from another person to put on and taking off regular lower body clothing?: A Little 6 Click Score: 20    End of Session Equipment Utilized During Treatment: Other (comment) (SPC)  OT Visit Diagnosis: Unsteadiness on feet (R26.81);Muscle weakness (generalized) (M62.81)   Activity Tolerance Patient tolerated  treatment well   Patient Left in chair;with call bell/phone within reach;with chair alarm set   Nurse Communication Mobility status;Other (comment) (Pt up in chair, ADL/bathe/dress complete, pt declined toileting. Urinal placed within reach.)        Time: 0825-0911 OT Time Calculation (min): 46 min  Charges: OT General Charges $OT Visit: 1 Visit OT Treatments $Self Care/Home Management : 38-52 mins   Gordan Grell Beth Dixon, OTR/L 03/15/2021, 9:35 AM

## 2021-03-15 NOTE — TOC Progression Note (Signed)
Transition of Care [facility]) - Progression Note    Patient Details  Name: HENSLEY EBERL MRN: [id number] Date of Birth: March 13, 1946  Transition of Care [facility]) CM/SW [place], RN Phone Number: 03/15/2021, 10:16 AM  Clinical Narrative:    Case management spoke with Shirlee Limerick, CM with [facility] and [place], [place] with [facility] this morning and both facilities have made bed offers in the hub for probable placement for LTC for the patient.  I spoke with Rushie Chestnut, SW with APS and she will reach out to [place], [facility] with [facility] this morning who has an available Memory Care bed once asset check details and Medicaid application is discussed.    I spoke with Ebony Hail, CM with [facility] and she is going to speak with D[place] Ar, SW this morning and will assist with financials for placement.  CM and MSW with DTP Team will continue to follow for LTC placement.   Expected Discharge Plan: Memory Care Barriers to Discharge: SNF Pending Medicaid (Patient has pending Medicaid application started through [place], MSW and need placement for Memory Care)  Expected Discharge Plan and Services Expected Discharge Plan: Memory Care In-house Referral: Clinical Social Work Discharge Planning Services: CM Consult Post Acute Care Choice: Nursing Home Living arrangements for the past 2 months: Apartment                                       Social Determinants of Health (SDOH) Interventions    Readmission Risk Interventions No flowsheet data found.

## 2021-03-15 NOTE — Progress Notes (Signed)
[facility] PROGRESS NOTE  [name] [id number] DOB: [birth year] DOA: 02/17/2021 PCP: Wendie Agreste, MD  Status: The patient remains OBS appropriate and will d/c before 2 midnights. Admitted with preadmission IVC in place which has expired.  Dispo: The patient is from: Home              Anticipated d/c is to: ALF vs SNF.  Psych testing patient have capacity              Patient currently is medically stable to d/c.  Difficult to place patient Yes   Level of care: Med-Surg  Code Status: Full Family Communication:  DVT prophylaxis: SCD COVID vaccination status: Pfizer 10/03/2019, 10/28/2019, and booster 06/28/2020  HPI: 75 year old male with history of hepatitis B, cirrhosis, hepatocellular carcinoma, pancytopenia, diabetes, sleep apnea with CPAP intolerance, dementia who presents from home after he was found to be wandering outside and refusing to go back to home.  Patient was unable to contribute any history due to his dementia, disorientation.  On presentation he was tachycardic in the range of 130s, normalized with IV fluids.  As per the family, he he has been recently combative, wandering here and there and they have been working with a Education officer, museum with plan for nursing facility placement.  On presentation, lab work showed chronic pancytopenia, hemoglobin A1c of 8.6.  Patient was admitted for further management and unsafe discharge plan. Patient was started on IV fluids for mild AKI.  Lab work showed pancytopenia, we have ordered vitamin 123456 and folic acid level.  Hemoglobin is currently stable.  His ammonia level was found to be high which could be associated with his history of cirrhosis, hepatitis B/HCC,started on lactulose.  He was IVCd by his son.  APS involved prior to admission and are assisting with discharge disposition.  He does not have any skilled needs therefore we are looking to place patient in an ALF memory care capabilities.  Disposition complicated by  lack of funding to pay for ALF and eligibility for Medicaid given patient continues to own his own home  As of 7/25 APS social worker Bubba Hales confirmed that she has clarified preadmission history as follows: Although patient does have capacity and was clarified by psych he likely lacks competence noting that while at home with his son he persistently refused medications.  He became difficult to manage and requires 24/7 supervision which family is unable to provide.  At times he has been combative with the son although has not really demonstrated this behavior here while on different medications.  Subjective: Awaken from sleep.  No specific complaints.  Hopeful that facility bed will become available soon.  Objective: Vitals:   03/14/21 2328 03/15/21 0520  BP: (!) 101/55 (!) 103/54  Pulse: 70 65  Resp: 18 17  Temp: 98.4 F (36.9 C) 98.4 F (36.9 C)  SpO2: 95% 99%    Intake/Output Summary (Last 24 hours) at 03/15/2021 0826 Last data filed at 03/15/2021 0523 Gross per 24 hour  Intake 360 ml  Output --  Net 360 ml    Filed Weights   02/22/21 0355 02/25/21 0705 03/06/21 0346  Weight: 86 kg 81.3 kg 76.4 kg    Exam:  Constitutional: Calm, no acute distress, appears to be comfortable Respiratory: Lung sounds remain clear and he is stable on room air Cardiovascular: Normal heart sounds, regular pulse, no further orthostatic hypotension, no peripheral edema Abdomen:  LBM 7/26, nontender nondistended with normoactive bowel sounds.  Eating well. Neurological:  Baseline cranial nerves intact except for he is hearing loss.  Ambulates without difficulty or gait disturbance. Psychiatric: Alert and oriented x3.  Pleasant affect.  No behavioral issues since arrival to the hospital.  Assessment/Plan: Acute problems: Dehydration/AKI:  Resolved  Creatinine: 1.38 >> 0.96   Dementia with behavioral disturbance:  Improved with the addition of Seroquel and citalopram  Plan is to discharge to  ALF/SNF with memory care capabilities Aricept dc'd 2/2 orthostasis.  Continue Namenda for now. APS involved prior to admission  7/19 supervising psychiatrist felt patient had capacity to make decisions.   Last cognitive evaluation revealed only mild cognitive impairment.   7/25 APS social worker was able to clarify preadmission history and although patient does have capacity as outlined by psych he does apparently lack behavioral competency ie noncompliant with medications prior to admission, combative behaviors with son prior to admission. These behaviors have not reemerged during this hospitalization and are much better controlled now that he is on different medications.   APS agreed that its since family unable to provide 24/7 care facility placement indicated.  Orthostatic hypotension OVS positive 7/19 -Aricept was discontinued on 7/21 and rpt OVS  WNL on 7/22   History of hepatitis B/cirrhosis/hepatocellular carcinoma:  Follows with Dr. Learta Codding.   Status post partial left hepatectomy on 02/2013.  Has chronic pancytopenia suboptimal blood pressure secondary to cirrhosis Vitamin B12 and folate levels are normal with hemoglobin stable at 8.1   Type 2 diabetes mellitus:  HgbA1c of 7.3 as of 08/2020.   Continue sliding scale insulin.   Patient eating better therefore will resume preadmission metformin (7/27) CBGs well controlled and less than 200  Moderate protein calorie malnutrition Nutrition Problem: Moderate Malnutrition Etiology: chronic illness (dementia) Signs/Symptoms: mild fat depletion, moderate fat depletion, mild muscle depletion, moderate muscle depletion Interventions: Liberalize Diet, MVI, Glucerna shake, Magic cup Body mass index is 24.87 kg/m.      Data Reviewed: Basic Metabolic Panel: No results for input(s): NA, K, CL, CO2, GLUCOSE, BUN, CREATININE, CALCIUM, MG, PHOS in the last 168 hours.  Liver Function Tests: No results for input(s): AST, ALT, ALKPHOS,  BILITOT, PROT, ALBUMIN in the last 168 hours.  No results for input(s): LIPASE, AMYLASE in the last 168 hours. No results for input(s): AMMONIA in the last 168 hours.  CBC: No results for input(s): WBC, NEUTROABS, HGB, HCT, MCV, PLT in the last 168 hours.  Cardiac Enzymes: No results for input(s): CKTOTAL, CKMB, CKMBINDEX, TROPONINI in the last 168 hours. BNP (last 3 results) Recent Labs    02/25/21 0749  BNP 89.1    ProBNP (last 3 results) No results for input(s): PROBNP in the last 8760 hours.  CBG: Recent Labs  Lab 03/13/21 2015 03/14/21 0753 03/14/21 1139 03/14/21 1627 03/14/21 2052  GLUCAP 181* 131* 181* 177* 109*    No results found for this or any previous visit (from the past 240 hour(s)).   Studies: No results found.  Scheduled Meds:  citalopram  10 mg Oral Daily   feeding supplement (GLUCERNA SHAKE)  237 mL Oral TID BM   ferrous sulfate  325 mg Oral TID WC   insulin aspart  0-6 Units Subcutaneous TID WC   lactulose  30 g Oral TID   memantine  5 mg Oral BID   multivitamin with minerals  1 tablet Oral Daily   pravastatin  20 mg Oral QHS   QUEtiapine  50 mg Oral QHS   Continuous Infusions:  Principal Problem:   Dementia without  behavioral disturbance (HCC) Active Problems:   OSA (obstructive sleep apnea)   Cancer, hepatocellular (HCC)   Memory impairment of gradual onset   Pancytopenia ([place])   Psychosis ([place])   Dehydration   Renal insufficiency   Malnutrition of moderate degree   Consultants: Psychiatry while in the ER  Procedures: None  Antibiotics: Anti-infectives (From admission, onward)    Start     Dose/Rate Route Frequency Ordered Stop   03/04/21 1330  doxycycline (VIBRA-TABS) tablet 200 mg        200 mg Oral  Once 03/04/21 1244 03/04/21 1332          Time spent: 15 minutes    Erin Hearing ANP  [facility] 7 am - 330 pm/M-F for direct patient care and secure chat Please refer to Amion for contact info 0   days

## 2021-03-16 DIAGNOSIS — E119 Type 2 diabetes mellitus without complications: Secondary | ICD-10-CM | POA: Diagnosis not present

## 2021-03-16 DIAGNOSIS — F0280 Dementia in other diseases classified elsewhere without behavioral disturbance: Secondary | ICD-10-CM | POA: Diagnosis not present

## 2021-03-16 LAB — GLUCOSE, CAPILLARY
Glucose-Capillary: 180 mg/dL — ABNORMAL HIGH (ref 70–99)
Glucose-Capillary: 192 mg/dL — ABNORMAL HIGH (ref 70–99)
Glucose-Capillary: 155 mg/dL — ABNORMAL HIGH (ref 70–99)

## 2021-03-16 LAB — RESP PANEL BY RT-PCR (FLU A&B, COVID) ARPGX2
Influenza A by PCR: NEGATIVE
Influenza B by PCR: NEGATIVE
SARS Coronavirus 2 by RT PCR: NEGATIVE

## 2021-03-16 MED ORDER — COVID-19 MRNA VAC-TRIS(PFIZER) 30 MCG/0.3ML IM SUSP
0.3000 mL | Freq: Once | INTRAMUSCULAR | Status: AC
  Administered 2021-03-16: 0.3 mL via INTRAMUSCULAR
  Filled 2021-03-16: qty 0.3

## 2021-03-16 MED ORDER — MIDODRINE HCL 5 MG PO TABS
2.5000 mg | ORAL_TABLET | Freq: Two times a day (BID) | ORAL | Status: DC
  Administered 2021-03-16: 2.5 mg via ORAL
  Filled 2021-03-16: qty 1

## 2021-03-16 MED ORDER — INSULIN ASPART 100 UNIT/ML IJ SOLN: 0.0000 [IU] | Freq: Three times a day (TID) | INTRAMUSCULAR | 11 refills | Status: DC

## 2021-03-16 MED ORDER — GLUCERNA SHAKE PO LIQD: 237.0000 mL | Freq: Three times a day (TID) | ORAL | 0 refills | Status: DC

## 2021-03-16 MED ORDER — LACTULOSE 10 GM/15ML PO SOLN: 30.0000 g | Freq: Three times a day (TID) | ORAL | 0 refills | Status: DC

## 2021-03-16 MED ORDER — SENNOSIDES-DOCUSATE SODIUM 8.6-50 MG PO TABS: 1.0000 | ORAL_TABLET | Freq: Every evening | ORAL | Status: DC | PRN

## 2021-03-16 MED ORDER — CITALOPRAM HYDROBROMIDE 10 MG PO TABS: 10.0000 mg | ORAL_TABLET | Freq: Every day | ORAL | Status: DC

## 2021-03-16 MED ORDER — MIDODRINE HCL 2.5 MG PO TABS: 2.5000 mg | ORAL_TABLET | Freq: Two times a day (BID) | ORAL | Status: DC

## 2021-03-16 MED ORDER — ACETAMINOPHEN 325 MG PO TABS: 650.0000 mg | ORAL_TABLET | Freq: Four times a day (QID) | ORAL | Status: DC | PRN

## 2021-03-16 MED ORDER — ADULT MULTIVITAMIN W/MINERALS CH: 1.0000 | ORAL_TABLET | Freq: Every day | ORAL | Status: DC

## 2021-03-16 MED ORDER — FERROUS SULFATE 325 (65 FE) MG PO TABS: 325.0000 mg | ORAL_TABLET | Freq: Three times a day (TID) | ORAL | 3 refills | Status: DC

## 2021-03-16 MED ORDER — QUETIAPINE FUMARATE 50 MG PO TABS: 50.0000 mg | ORAL_TABLET | Freq: Every day | ORAL | Status: DC

## 2021-03-16 NOTE — Progress Notes (Signed)
Palliative Medicine RN Note: Our team has been shadowing this patient. There is no further role for PMT, and pt appears to be stable for d/c per [facility] note.  Our team will sign off. Please reconsult and call our office if new needs arise.  Marjie Skiff Sheelah Ritacco, RN, BSN, [facility] Palliative Medicine Team 03/16/2021 1:37 PM Office [phone]

## 2021-03-16 NOTE — Progress Notes (Signed)
Patient again seen and examined.  Resting comfortably in the chair. He has a facility to go today and is medically stable for discharge, discharge summary reviewed and completed. Patient will be going on Celexa, Namenda and bedtime Seroquel.  He will continue his pravastatin, metformin lactulose Metamucil iron supplementation and nutritional supplementation

## 2021-03-16 NOTE — Discharge Summary (Addendum)
Physician Discharge Summary  [name] [id number] DOB: 27-Jan-1946 DOA: 02/17/2021  PCP: Wendie Agreste, MD  Admit date: 02/17/2021 Discharge date: 03/16/2021  Time spent: 35 minutes  Recommendations for Outpatient Follow-up:  Recommend PT and OT reevaluation upon arrival to facility He will discharge to [place] Please involve patient in facility activities as much as possible As of orthostatic hypotension preadmission Aricept was discontinued on 7/20.  Has had remote emergence of orthostatic hypotension as of 7/28 so Namenda discontinued as well.  He may also have a degree of neurogenic/vasogenic orthostasis so I will start low-dose midodrine 2.5 mg twice daily Please mobilize with assistance.  Patient has preferentially stayed in the bed and slept most days and this is likely contributing to his orthostasis. Repeat BP after 1st dose Midodrine was 94/58. Await washout of Namenda. Encourage PO fluids for 1 week.   Discharge Diagnoses:  Principal Problem:   Dementia without behavioral disturbance ([place]) Active Problems:   Controlled type 2 diabetes mellitus without complication, without long-term current use of insulin (HCC)   OSA (obstructive sleep apnea)   Cancer, hepatocellular (HCC)   Memory impairment of gradual onset   Pancytopenia (HCC)   Psychosis (HCC)   Dehydration   Renal insufficiency   Malnutrition of moderate degree  COVID vaccination status: Pfizer 10/03/2019, 10/28/2019, first booster 06/28/2020 with second booster ordered to be given on discharge date 7/28  Discharge Condition: Stable  Diet recommendation: Carbohydrate modified  Filed Weights   02/22/21 0355 02/25/21 0705 03/06/21 0346  Weight: 86 kg 81.3 kg 76.4 kg    History of present illness:  75 year old male with history of hepatitis B, cirrhosis, hepatocellular carcinoma, pancytopenia, diabetes, sleep apnea with CPAP intolerance, dementia who presents from home after he was  found to be wandering outside and refusing to go back to home.  Patient was unable to contribute any history due to his dementia, disorientation.  On presentation he was tachycardic in the range of 130s, normalized with IV fluids.  As per the family, he he has been recently combative, wandering here and there and they have been working with a Education officer, museum with plan for nursing facility placement.  On presentation, lab work showed chronic pancytopenia, hemoglobin A1c of 8.6.  Patient was admitted for further management and unsafe discharge plan. Patient was started on IV fluids for mild AKI.  Lab work showed pancytopenia, we have ordered vitamin 123456 and folic acid level.  Hemoglobin is currently stable.  His ammonia level was found to be high which could be associated with his history of cirrhosis, hepatitis B/HCC,started on lactulose.  He was IVCd by his son.  APS involved prior to admission and are assisting with discharge disposition.  He does not have any skilled needs therefore we are looking to place patient in an ALF memory care capabilities.  Disposition complicated by lack of funding to pay for ALF and eligibility for Medicaid given patient continues to own his own home   As of 7/25 APS social worker Bubba Hales confirmed that she has clarified preadmission history as follows: Although patient does have capacity and was clarified by psych he likely lacks competence noting that while at home with his son he persistently refused medications.  He became difficult to manage and requires 24/7 supervision which family is unable to provide.  At times he has been combative with the son although has not really demonstrated this behavior here while on different medications.  Hospital Course:  Dehydration/AKI: Resolved Creatinine:  1.38 >> 0.96   Dementia with behavioral disturbance: Improved with the addition of Seroquel and citalopram Plan is to discharge to ALF/SNF with memory care capabilities Aricept dc'd  2/2 orthostasis.  Continue Namenda for now. APS involved prior to admission 7/19 supervising psychiatrist felt patient had capacity to make decisions.   Last cognitive evaluation revealed only mild cognitive impairment.   7/25 APS social worker was able to clarify preadmission history and although patient does have capacity as outlined by psych he does apparently lack behavioral competency ie noncompliant with medications prior to admission, combative behaviors with son prior to admission. These behaviors have not reemerged during this hospitalization and are much better controlled now that he is on different medications.   APS agreed that its since family unable to provide 24/7 care facility placement indicated.   Orthostatic hypotension OVS positive 7/19 -Aricept was discontinued on 7/21 and rpt OVS  WNL on 7/22 -Appears to have had some recurrence of orthostatic hypotension today so we will discontinue Namenda as well.   History of hepatitis B/cirrhosis/hepatocellular carcinoma:  Follows with Dr. Learta Codding.   Status post partial left hepatectomy on 02/2013.  Has chronic pancytopenia suboptimal blood pressure secondary to cirrhosis Vitamin B12 and folate levels are normal with hemoglobin stable at 8.1   Type 2 diabetes mellitus: HgbA1c of 7.3 as of 08/2020.   Continue sliding scale insulin.   Patient eating better therefore will resume preadmission metformin (7/27) CBGs well controlled and less than 200   Moderate protein calorie malnutrition Nutrition Problem: Moderate Malnutrition Etiology: chronic illness (dementia) Signs/Symptoms: mild fat depletion, moderate fat depletion, mild muscle depletion, moderate muscle depletion Interventions: Liberalize Diet, MVI, Glucerna shake, Magic cup Body mass index is 24.87 kg/m.     Procedures: None  Consultations: Psychiatry  Discharge Exam: Vitals:   03/16/21 1150 03/16/21 1602  BP: (!) 88/30 (!) 96/49  Pulse: 71 72  Resp: 18 17   Temp: 98.3 F (36.8 C) 98.1 F (36.7 C)  SpO2: 100% 100%   Constitutional: Awakened, calm, no acute distress Respiratory: Stable on room air, normal lung sounds with normal pulse oximetry reading Cardiovascular: S1-S2, normotensive, no peripheral edema Abdomen:  LBM 7/27, soft and nontender with normoactive bowel sounds.  Eating well. Neurological: Baseline cranial nerves intact except for he is hearing loss.  Ambulates without difficulty or gait disturbance. Psychiatric: Awake and oriented x3 with pleasant affect.  Discharge Instructions   Discharge Instructions     Diet Carb Modified   Complete by: As directed    Discharge instructions   Complete by: As directed    PT/OT evaluation upon presentation to facility   Increase activity slowly   Complete by: As directed       Allergies as of 03/16/2021       Reactions   Ace Inhibitors Swelling, Other (See Comments)   Angioedema - face.         Medication List     STOP taking these medications    donepezil 10 MG tablet Commonly known as: ARICEPT   memantine 5 MG tablet Commonly known as: Namenda       TAKE these medications    acetaminophen 325 MG tablet Commonly known as: TYLENOL Take 2 tablets (650 mg total) by mouth every 6 (six) hours as needed for mild pain (or Fever >/= 101).   citalopram 10 MG tablet Commonly known as: CELEXA Take 1 tablet (10 mg total) by mouth daily. Start taking on: March 17, 2021   feeding  supplement (GLUCERNA SHAKE) Liqd Take 237 mLs by mouth 3 (three) times daily between meals.   ferrous sulfate 325 (65 FE) MG tablet Take 1 tablet (325 mg total) by mouth 3 (three) times daily with meals. What changed: when to take this   insulin aspart 100 UNIT/ML injection Commonly known as: novoLOG Inject 0-6 Units into the skin 3 (three) times daily with meals. Do NOT hold if patient is NPO.   lactulose 10 GM/15ML solution Commonly known as: CHRONULAC Take 45 mLs (30 g total) by  mouth 3 (three) times daily.   METAMUCIL PO Take 1 Scoop by mouth daily as needed (constipation).   metFORMIN 500 MG tablet Commonly known as: GLUCOPHAGE TAKE 1 TABLET BY MOUTH TWICE DAILY WITH A MEAL   midodrine 2.5 MG tablet Commonly known as: PROAMATINE Take 1 tablet (2.5 mg total) by mouth 2 (two) times daily with a meal.   multivitamin with minerals Tabs tablet Take 1 tablet by mouth daily. Start taking on: March 17, 2021   pravastatin 20 MG tablet Commonly known as: PRAVACHOL Take 1 tablet (20 mg total) by mouth at bedtime.   QUEtiapine 50 MG tablet Commonly known as: SEROQUEL Take 1 tablet (50 mg total) by mouth at bedtime. What changed:  medication strength how much to take   senna-docusate 8.6-50 MG tablet Commonly known as: Senokot-S Take 1 tablet by mouth at bedtime as needed for mild constipation.       Allergies  Allergen Reactions   Ace Inhibitors Swelling and Other (See Comments)    Angioedema - face.     Contact information for after-discharge care     [place] .   Service: Skilled Nursing Contact information: [address] [place] [phone]                      The results of significant diagnostics from this hospitalization (including imaging, microbiology, ancillary and laboratory) are listed below for reference.    Significant Diagnostic Studies: No results found.  Microbiology: Recent Results (from the past 240 hour(s))  Resp Panel by RT-PCR (Flu A&B, Covid) Nasopharyngeal Swab     Status: None   Collection Time: 03/16/21  9:51 AM   Specimen: Nasopharyngeal Swab; Nasopharyngeal(NP) swabs in vial transport medium  Result Value Ref Range Status   SARS Coronavirus 2 by RT PCR NEGATIVE NEGATIVE Final    Comment: (NOTE) SARS-CoV-2 target nucleic acids are NOT DETECTED.  The SARS-CoV-2 RNA is generally detectable in upper  respiratory specimens during the acute phase of infection. The lowest concentration of SARS-CoV-2 viral copies this assay can detect is 138 copies/mL. A negative result does not preclude SARS-Cov-2 infection and should not be used as the sole basis for treatment or other patient management decisions. A negative result may occur with  improper specimen collection/handling, submission of specimen other than nasopharyngeal swab, presence of viral mutation(s) within the areas targeted by this assay, and inadequate number of viral copies(<138 copies/mL). A negative result must be combined with clinical observations, patient history, and epidemiological information. The expected result is Negative.  Fact Sheet for Patients:  EntrepreneurPulse.com.au  Fact Sheet for Healthcare Providers:  IncredibleEmployment.be  This test is no t yet approved or cleared by the [place] FDA and  has been authorized for detection and/or diagnosis of SARS-CoV-2 by FDA under an Emergency Use Authorization (EUA). This EUA will remain  in effect (meaning this test  can be used) for the duration of the COVID-19 declaration under Section 564(b)(1) of the Act, 21 U.S.C.section 360bbb-3(b)(1), unless the authorization is terminated  or revoked sooner.       Influenza A by PCR NEGATIVE NEGATIVE Final   Influenza B by PCR NEGATIVE NEGATIVE Final    Comment: (NOTE) The Xpert Xpress SARS-CoV-2/FLU/RSV plus assay is intended as an aid in the diagnosis of influenza from Nasopharyngeal swab specimens and should not be used as a sole basis for treatment. Nasal washings and aspirates are unacceptable for Xpert Xpress SARS-CoV-2/FLU/RSV testing.  Fact Sheet for Patients: EntrepreneurPulse.com.au  Fact Sheet for Healthcare Providers: IncredibleEmployment.be  This test is not yet approved or cleared by the [place] FDA and has been  authorized for detection and/or diagnosis of SARS-CoV-2 by FDA under an Emergency Use Authorization (EUA). This EUA will remain in effect (meaning this test can be used) for the duration of the COVID-19 declaration under Section 564(b)(1) of the Act, 21 U.S.C. section 360bbb-3(b)(1), unless the authorization is terminated or revoked.  Performed at [facility], [place] [address], [place], [place] [address]      Labs: Basic Metabolic Panel: No results for input(s): NA, K, CL, CO2, GLUCOSE, BUN, CREATININE, CALCIUM, MG, PHOS in the last 168 hours. Liver Function Tests: No results for input(s): AST, ALT, ALKPHOS, BILITOT, PROT, ALBUMIN in the last 168 hours. No results for input(s): LIPASE, AMYLASE in the last 168 hours. No results for input(s): AMMONIA in the last 168 hours. CBC: No results for input(s): WBC, NEUTROABS, HGB, HCT, MCV, PLT in the last 168 hours. Cardiac Enzymes: No results for input(s): CKTOTAL, CKMB, CKMBINDEX, TROPONINI in the last 168 hours. BNP: BNP (last 3 results) Recent Labs    02/25/21 0749  BNP 89.1    ProBNP (last 3 results) No results for input(s): PROBNP in the last 8760 hours.  CBG: Recent Labs  Lab 03/15/21 1616 03/15/21 2030 03/16/21 0806 03/16/21 1149 03/16/21 1605  GLUCAP 133* 147* 180* 192* 155*       Signed:  Eboni Coval ANP [facility] 03/16/2021, 4:18 PM

## 2021-03-16 NOTE — Progress Notes (Signed)
Pt prepared for d/c to SNF. IV d/c'd. Skin intact except as charted in most recent assessments. Vitals are stable. Attempted report called to receiving facility x3 w/ no answer. 4th call was placed on hold and waited 12 min. Pt transported by ambulance service.

## 2021-03-16 NOTE — Progress Notes (Addendum)
12pm: Patient will go to [facility] via [facility] has been scheduled for pick up at 3pm. The number to call report is [phone].  RN aware of discharge plan.  9:40am: CSW spoke with Ebony Hail of [facility] who states the patient can admit to the facility today, pending results from an updated COVID test.  CSW notified NP of request for updated COVID test.  CSW spoke with Talbert Forest of [facility] APS to inform her of discharge plan.  Madilyn Fireman, MSW, LCSW Transitions of Care  Clinical Social Worker II [phone]

## 2021-03-17 ENCOUNTER — Other Ambulatory Visit: Payer: Self-pay

## 2021-03-17 NOTE — Patient Outreach (Signed)
[facility]) Care Management  03/17/2021  FORD MATKOWSKI 1945-08-30 [id number]   [place] Organization [ACO] Patient:  [employer] Medicare  Primary Care Provider: Wendie Agreste, MD, [place]   Notified Embedded LCSW of disposition to a skilled nursing facility on 03/16/2021 to inform of patient's transition from [facility] hospital.  Natividad Brood, RN BSN [facility] Liaison  [phone] business mobile phone Toll free office [phone]  Fax number: [phone] [email or web address]

## 2021-03-20 ENCOUNTER — Ambulatory Visit (INDEPENDENT_AMBULATORY_CARE_PROVIDER_SITE_OTHER): Payer: Medicare Other | Admitting: *Deleted

## 2021-03-20 DIAGNOSIS — E119 Type 2 diabetes mellitus without complications: Secondary | ICD-10-CM | POA: Diagnosis not present

## 2021-03-20 DIAGNOSIS — R413 Other amnesia: Secondary | ICD-10-CM

## 2021-03-20 DIAGNOSIS — I10 Essential (primary) hypertension: Secondary | ICD-10-CM | POA: Diagnosis not present

## 2021-03-20 NOTE — Chronic Care Management (AMB) (Signed)
Chronic Care Management    Clinical Social Work Note  03/20/2021 Name: [name] MRN: [id number] DOB: 1946/08/20  [name] is a 75 y.o. year old male who is a primary care patient of Carlota Raspberry, Ranell Patrick, MD. The CCM team was consulted to assist the patient with chronic disease management and/or care coordination needs related to: Level of Care Concerns.   Engaged with patient by telephone for follow up visit in response to provider referral for social work chronic care management and care coordination services.   Consent to Services:  The patient was given information about Chronic Care Management services, agreed to services, and gave verbal consent prior to initiation of services.  Please see initial visit note for detailed documentation.   Patient agreed to services and consent obtained.   Assessment: Review of patient past medical history, allergies, medications, and health status, including review of relevant consultants reports was performed today as part of a comprehensive evaluation and provision of chronic care management and care coordination services.     SDOH (Social Determinants of Health) assessments and interventions performed:    Advanced Directives Status: See Care Plan for related entries.  CCM Care Plan  Allergies  Allergen Reactions   Ace Inhibitors Swelling and Other (See Comments)    Angioedema - face.     Outpatient Encounter Medications as of 03/20/2021  Medication Sig   acetaminophen (TYLENOL) 325 MG tablet Take 2 tablets (650 mg total) by mouth every 6 (six) hours as needed for mild pain (or Fever >/= 101).   citalopram (CELEXA) 10 MG tablet Take 1 tablet (10 mg total) by mouth daily.   feeding supplement, GLUCERNA SHAKE, (GLUCERNA SHAKE) LIQD Take 237 mLs by mouth 3 (three) times daily between meals.   ferrous sulfate 325 (65 FE) MG tablet Take 1 tablet (325 mg total) by mouth 3 (three) times daily with meals.   insulin aspart (NOVOLOG) 100  UNIT/ML injection Inject 0-6 Units into the skin 3 (three) times daily with meals. Do NOT hold if patient is NPO.   lactulose (CHRONULAC) 10 GM/15ML solution Take 45 mLs (30 g total) by mouth 3 (three) times daily.   metFORMIN (GLUCOPHAGE) 500 MG tablet TAKE 1 TABLET BY MOUTH TWICE DAILY WITH A MEAL   midodrine (PROAMATINE) 2.5 MG tablet Take 1 tablet (2.5 mg total) by mouth 2 (two) times daily with a meal.   Multiple Vitamin (MULTIVITAMIN WITH MINERALS) TABS tablet Take 1 tablet by mouth daily.   pravastatin (PRAVACHOL) 20 MG tablet Take 1 tablet (20 mg total) by mouth at bedtime.   Psyllium (METAMUCIL PO) Take 1 Scoop by mouth daily as needed (constipation).   QUEtiapine (SEROQUEL) 50 MG tablet Take 1 tablet (50 mg total) by mouth at bedtime.   senna-docusate (SENOKOT-S) 8.6-50 MG tablet Take 1 tablet by mouth at bedtime as needed for mild constipation.   No facility-administered encounter medications on file as of 03/20/2021.    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  Irregular heart beat 01/28/2018   Noncompliance with CPAP treatment 08/14/2017   Excessive daytime sleepiness 04/09/2017   Snoring 04/09/2017   Memory impairment of gradual onset 04/09/2017   Aortic atherosclerosis ([place]) 11/27/2016   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 11/11/2013   Altered mental status 03/05/2013   Respiratory failure, post-operative ([place])  03/02/2013   Hepatitis B 02/24/2013   Cancer, hepatocellular ([place]) 02/10/2013   Liver tumor-bleeding 01/15/2013   Epidermal cyst 06/18/2012   OSA (obstructive sleep apnea) 03/13/2012   Abnormal leg movement 12/12/2011   Blood pressure elevated 09/06/2011   Controlled type 2 diabetes mellitus without complication, without long-term current use of insulin ([place]) 09/06/2011   Narcolepsy 09/06/2011   BPH (benign prostatic hyperplasia) 09/06/2011   Diverticula of colon 09/06/2011    Conditions to be addressed/monitored: Dementia; Level of care concerns  Care Plan : LCSW Plan of Care  Updates made by Deirdre Peer, LCSW since 03/20/2021 12:00 AM     Problem: Lack of knowledge for self management of long term care plans with cognitive impairment and inabilty to live independently   Priority: High     Long-Range Goal: Devlopment of long term care options/planning for patient with progressing cognitive impairment and inability to live independently Completed 03/20/2021  Start Date: 01/31/2021  Expected End Date: 04/19/2021  This Visit's Progress: On track  Recent Progress: On track  Priority: High  Note:   Current barriers:   Patient in need of assistance with connecting to community resources for Financial constraints related to obtaining Medicaid and/or placment at this time, Level of care concerns, Limited access to caregiver, Cognitive Deficits, and Lacks knowledge of community resources Acknowledges deficits with meeting this unmet need Patient is unable to independently navigate community resource options without care coordination support Clinical Goals:  patient will work with SW to address concerns related to probable ALF/memory care placement Patient/son will follow up with completion of selling pt's home and applying for Medicaid as directed by SW Clinical Interventions:  CSW made contact with pt's niece/HCPOA-Rhonda, who confirms pt has been placed at [facility]  SNF.  CSW advised her of plans to sign off as he has met placement goals and will be long term placement. Per Suanne Marker, she has not been able to locate his hearing aides and inquiring if they may be at hospital.  CSW provided niece with # to call at [facility] to see if they are possibly there.  CSW also spoke with son, Etta Grandchild, who also reports his father has been placed in SNF.  Both are aware of plans for CSW to sign off at this time.  Collaboration with Wendie Agreste, MD regarding development and update of comprehensive plan of care as evidenced by provider attestation and co-signature Inter-disciplinary care team collaboration (see longitudinal plan of care) Assessment of needs, barriers , agencies contacted, as well as how impacting  Review various resources, discussed options and provided patient information about Financial constraints related to placement, Level of care concerns, Limited access to caregiver, and Cognitive Deficits Collaborated with appropriate clinical care team members regarding patient needs Financial constraints related to placement, Level of care concerns, Limited access to caregiver, and Lacks knowledge of community resources to aide in providing support and care to pt, HTN and Dementia Referral placed via [employer] Patient interviewed and appropriate assessments performed Assisted patient/caregiver with obtaining information about health plan benefits Provided education to patient/caregiver regarding level of care options. Other interventions provided: Solution-Focused Strategies, Active  listening / Reflection utilized , Problem Solving [place] , Motivational Interviewing, Caregiver stress acknowledged , Consideration of in-home help encouraged , and will assist with continued work towards placement (with financial and medical steps)  Patient Goals:  -resolve financial barriers and apply for Medicaid -review list of Assisted Living facilities to be mailed to  you -research and tour facilities of interest -consider in-home support (private duty care)  - begin a notebook of services in my neighborhood or community - call 211 when I need some help - follow-up on any referrals for help I am given - think ahead to make sure my need does not become an emergency - make a note about what I need to have by the phone or take with me, like an identification card or social security number have a back-up plan - have a back-up plan - make a list of family or friends that I can call  -  Follow Up Plan: N/A      Follow Up Plan:  Plans for CSW to sign off.      [name] MSW, LCSW Licensed Clinical Social Worker [employer] [phone]

## 2021-03-20 NOTE — Patient Instructions (Signed)
Visit Information  PATIENT GOALS:  Goals Addressed             This Visit's Progress    COMPLETED: To develop long term care plan for pt       Timeframe:  Long-Range Goal Priority:  High Start Date:      01/31/2021                       Expected End Date:   04/19/2021                    Follow Up Date 03/20/2021   -resolve financial barriers and apply for Medicaid -review list of Assisted Living facilities to be mailed to you -research and tour facilities of interest -consider in-home support (private duty care)  - begin a notebook of services in my neighborhood or community - call 211 when I need some help - follow-up on any referrals for help I am given - think ahead to make sure my need does not become an emergency - make a note about what I need to have by the phone or take with me, like an identification card or social security number have a back-up plan - have a back-up plan - make a list of family or friends that I can call    Why is this important?   Knowing how and where to find help for yourself or family in your neighborhood and community is an important skill.  You will want to take some steps to learn how.    Notes:         The patient verbalized understanding of instructions, educational materials, and care plan provided today and declined offer to receive copy of patient instructions, educational materials, and care plan.   No further follow up required: SNF placement at [facility] [place] MSW, LCSW Licensed Clinical Social Worker [employer] [phone]

## 2021-03-27 ENCOUNTER — Ambulatory Visit: Payer: Medicare Other | Admitting: Adult Health

## 2021-03-27 ENCOUNTER — Encounter: Payer: Self-pay | Admitting: Adult Health

## 2021-04-04 ENCOUNTER — Other Ambulatory Visit: Payer: Self-pay | Admitting: *Deleted

## 2021-04-04 DIAGNOSIS — C220 Liver cell carcinoma: Secondary | ICD-10-CM

## 2021-04-04 NOTE — Progress Notes (Signed)
Noted patient discharged from hospital 03/16/21 to [facility] in [place], [place]. Per Dr. Benay Spice, needs lab/OV in 2-3 weeks. Scheduling message sent.

## 2021-04-05 ENCOUNTER — Telehealth: Payer: Self-pay | Admitting: Oncology

## 2021-04-05 NOTE — Telephone Encounter (Signed)
Scheduled appt per 8/16 sch msg - called son - 2nd attempt . First attempt made by [facility] yesterday.   Left message for son to call back to confirm appt. Once the appt is confirmed we will call the living facility.

## 2021-04-17 ENCOUNTER — Inpatient Hospital Stay: Payer: Medicare Other | Admitting: Oncology

## 2021-04-17 ENCOUNTER — Inpatient Hospital Stay: Payer: Medicare Other | Attending: Oncology

## 2021-07-18 ENCOUNTER — Emergency Department (HOSPITAL_COMMUNITY)
Admission: EM | Admit: 2021-07-18 | Discharge: 2021-07-18 | Disposition: A | Discharge status: Home / Self Care | Payer: Medicare Other | Attending: Emergency Medicine | Admitting: Emergency Medicine

## 2021-07-18 ENCOUNTER — Other Ambulatory Visit: Payer: Self-pay

## 2021-07-18 ENCOUNTER — Encounter: Payer: Self-pay | Admitting: Oncology

## 2021-07-18 ENCOUNTER — Encounter (HOSPITAL_COMMUNITY): Payer: Self-pay | Admitting: Emergency Medicine

## 2021-07-18 DIAGNOSIS — E119 Type 2 diabetes mellitus without complications: Secondary | ICD-10-CM | POA: Insufficient documentation

## 2021-07-18 DIAGNOSIS — Z8505 Personal history of malignant neoplasm of liver: Secondary | ICD-10-CM | POA: Diagnosis not present

## 2021-07-18 DIAGNOSIS — Z87891 Personal history of nicotine dependence: Secondary | ICD-10-CM | POA: Insufficient documentation

## 2021-07-18 DIAGNOSIS — Z7984 Long term (current) use of oral hypoglycemic drugs: Secondary | ICD-10-CM | POA: Insufficient documentation

## 2021-07-18 DIAGNOSIS — F0390 Unspecified dementia without behavioral disturbance: Secondary | ICD-10-CM | POA: Insufficient documentation

## 2021-07-18 DIAGNOSIS — D649 Anemia, unspecified: Secondary | ICD-10-CM | POA: Diagnosis present

## 2021-07-18 DIAGNOSIS — I10 Essential (primary) hypertension: Secondary | ICD-10-CM | POA: Diagnosis not present

## 2021-07-18 DIAGNOSIS — Z794 Long term (current) use of insulin: Secondary | ICD-10-CM | POA: Insufficient documentation

## 2021-07-18 DIAGNOSIS — R791 Abnormal coagulation profile: Secondary | ICD-10-CM | POA: Insufficient documentation

## 2021-07-18 LAB — CBC WITH DIFFERENTIAL/PLATELET
Abs Immature Granulocytes: 0.01 10*3/uL (ref 0.00–0.07)
Basophils Absolute: 0 10*3/uL (ref 0.0–0.1)
Basophils Relative: 1 %
Eosinophils Absolute: 0.2 10*3/uL (ref 0.0–0.5)
Eosinophils Relative: 6 %
HCT: 25.2 % — ABNORMAL LOW (ref 39.0–52.0)
Hemoglobin: 7.3 g/dL — ABNORMAL LOW (ref 13.0–17.0)
Immature Granulocytes: 0 %
Lymphocytes Relative: 17 %
Lymphs Abs: 0.5 10*3/uL — ABNORMAL LOW (ref 0.7–4.0)
MCH: 26 pg (ref 26.0–34.0)
MCHC: 29 g/dL — ABNORMAL LOW (ref 30.0–36.0)
MCV: 89.7 fL (ref 80.0–100.0)
Monocytes Absolute: 0.3 10*3/uL (ref 0.1–1.0)
Monocytes Relative: 10 %
Neutro Abs: 1.8 10*3/uL (ref 1.7–7.7)
Neutrophils Relative %: 66 %
Platelets: 60 10*3/uL — ABNORMAL LOW (ref 150–400)
RBC: 2.81 MIL/uL — ABNORMAL LOW (ref 4.22–5.81)
RDW: 17.6 % — ABNORMAL HIGH (ref 11.5–15.5)
WBC: 2.8 10*3/uL — ABNORMAL LOW (ref 4.0–10.5)
nRBC: 0 % (ref 0.0–0.2)

## 2021-07-18 LAB — BASIC METABOLIC PANEL
Anion gap: 5 (ref 5–15)
BUN: 16 mg/dL (ref 8–23)
CO2: 23 mmol/L (ref 22–32)
Calcium: 8.5 mg/dL — ABNORMAL LOW (ref 8.9–10.3)
Chloride: 111 mmol/L (ref 98–111)
Creatinine, Ser: 1.03 mg/dL (ref 0.61–1.24)
GFR, Estimated: >60 mL/min (ref >60)
Glucose, Bld: 154 mg/dL — ABNORMAL HIGH (ref 70–99)
Potassium: 4.1 mmol/L (ref 3.5–5.1)
Sodium: 139 mmol/L (ref 135–145)

## 2021-07-18 LAB — PROTIME-INR
INR: 1.4 — ABNORMAL HIGH (ref 0.8–1.2)
Prothrombin Time: 16.6 seconds — ABNORMAL HIGH (ref 11.4–15.2)

## 2021-07-18 LAB — POC OCCULT BLOOD, ED: Fecal Occult Bld: POSITIVE — AB

## 2021-07-18 NOTE — ED Triage Notes (Signed)
Pt sent to the ED for possible blood transfusion for a low hemoglobin , unknown level , no paper with results brought with pt , pt is from [facility] , pt has dementia alert tot self

## 2021-07-18 NOTE — Discharge Instructions (Addendum)
Please contact Dr. Carin Hock office tomorrow morning to request a follow-up appointment.  At this time, we do not feel he needs a blood transfusion however he should have his hemoglobin levels monitored closely.  If he has any blood in his stool, episodes of passing out or lightheadedness or difficulty breathing or other new concerning symptom, come back to ER for reassessment.

## 2021-07-18 NOTE — ED Provider Notes (Signed)
[facility] EMERGENCY DEPARTMENT Provider Note   CSN: [id number] Arrival date & time: 07/18/21  1729     History Chief Complaint  Patient presents with   Abnormal Lab    [name] is a 75 y.o. male.  Presents to ER with concern for low blood count.  From [place], patient has dementia.  Oriented to self.  He denies any medical complaints at present.  History is limited due to patient's dementia.  Additional history obtained from chart review, discussion with patient's niece, POA.  Niece reports patient has long history of anemia, she was told that his blood counts were low which prompted the ER visit today.  She was not aware of any other issues.  Per review of chart -patient has long history of pancytopenia, cirrhosis, prior history of hepatocellular carcinoma.  Followed by Dr. Benay Spice with oncology.  HPI     Past Medical History:  Diagnosis Date   Cancer ([place])    hepatocellular cancer    Diabetes mellitus without complication ([place])    Dyspnea    Hepatitis B    Hyperlipidemia    Hypertension    Irregular heart beat 01/28/2018   Narcolepsy    per office visit note of 08/2011    Sleep apnea    cpap    Patient Active Problem List   Diagnosis Date Noted   Malnutrition of moderate degree 02/22/2021   Dehydration 02/18/2021   Renal insufficiency 02/18/2021   Dementia without behavioral disturbance (HCC)    Psychosis ([place]) 10/06/2020   Iron deficiency anemia 11/04/2018   Orthostatic hypotension 08/06/2018   Hypertension 08/06/2018   Hyperlipidemia 08/06/2018   Pancytopenia ([place]) 08/06/2018   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   Insufficient treatment with nasal CPAP 05/05/2018   MCI (mild cognitive impairment) with memory loss 05/05/2018   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   Irregular heart beat 01/28/2018   Noncompliance with CPAP treatment 08/14/2017   Excessive daytime sleepiness 04/09/2017   Snoring 04/09/2017   Memory  impairment of gradual onset 04/09/2017   Aortic atherosclerosis ([place]) 11/27/2016   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 11/11/2013   Altered mental status 03/05/2013   Respiratory failure, post-operative ([place]) 03/02/2013   Hepatitis B 02/24/2013   Cancer, hepatocellular ([place]) 02/10/2013   Liver tumor-bleeding 01/15/2013   Epidermal cyst 06/18/2012   OSA (obstructive sleep apnea) 03/13/2012   Abnormal leg movement 12/12/2011   Blood pressure elevated 09/06/2011   Controlled type 2 diabetes mellitus without complication, without long-term current use of insulin ([place]) 09/06/2011   Narcolepsy 09/06/2011   BPH (benign prostatic hyperplasia) 09/06/2011   Diverticula of colon 09/06/2011    Past Surgical History:  Procedure Laterality Date   CHOLECYSTECTOMY N/A 01/28/2018   Procedure: LAPAROSCOPIC CHOLECYSTECTOMY;  Surgeon: [name], MD;  Location: [facility];  Service: General;  Laterality: N/A;   COLONOSCOPY WITH PROPOFOL N/A 08/27/2017   Procedure: COLONOSCOPY WITH PROPOFOL;  Surgeon: Clarene Essex, MD;  Location: [facility];  Service: Endoscopy;  Laterality: N/A;   ESOPHAGOGASTRODUODENOSCOPY (EGD) WITH PROPOFOL N/A 02/23/2020   Procedure: ESOPHAGOGASTRODUODENOSCOPY (EGD) WITH PROPOFOL;  Surgeon: Clarene Essex, MD;  Location: [facility];  Service: Endoscopy;  Laterality: N/A;   HOT HEMOSTASIS N/A 08/27/2017   Procedure: HOT HEMOSTASIS (ARGON PLASMA COAGULATION/BICAP);  Surgeon: Clarene Essex, MD;  Location: [facility];  Service: Endoscopy;  Laterality: N/A;   LAPAROSCOPIC CHOLECYSTECTOMY  01/28/2018   LAPAROSCOPY  03/02/2013   Procedure: LAPAROSCOPY DIAGNOSTIC;  Surgeon: [name], MD;  Location:  [facility];  Service: General;;   LIVER ULTRASOUND  03/02/2013   Procedure: LIVER ULTRASOUND;  Surgeon: [name], MD;  Location: [facility];  Service: General;;   OPEN PARTIAL HEPATECTOMY   03/02/2013   Procedure: OPEN PARTIAL HEPATECTOMY [83];  Surgeon: [name], MD;  Location: [facility];  Service: General;;  DX LAPAROSCOPY, INTRAOPERATIVE LIVER ULTRASOUND, OPEN PARTIAL HEPATECTOMY   pinched nerve in back         Family History  Problem Relation Age of Onset   Dementia Mother    Cancer Father    Diabetes Brother    Hypertension Brother    Cancer Brother     Social History   Tobacco Use   Smoking status: Former    Years: 5.00    Types: Cigarettes   Smokeless tobacco: Never  Vaping Use   Vaping Use: Never used  Substance Use Topics   Alcohol use: No   Drug use: No    Home Medications Prior to Admission medications   Medication Sig Start Date End Date Taking? Authorizing Provider  acetaminophen (TYLENOL) 325 MG tablet Take 2 tablets (650 mg total) by mouth every 6 (six) hours as needed for mild pain (or Fever >/= 101). 03/16/21  Yes Samella Parr, NP  feeding supplement, GLUCERNA SHAKE, (GLUCERNA SHAKE) LIQD Take 237 mLs by mouth 3 (three) times daily between meals. 03/16/21  Yes Samella Parr, NP  lactulose (CHRONULAC) 10 GM/15ML solution Take 45 mLs (30 g total) by mouth 3 (three) times daily. Patient taking differently: Take 15 g by mouth 3 (three) times daily. 03/16/21  Yes Samella Parr, NP  rifaximin (XIFAXAN) 550 MG TABS tablet Take 550 mg by mouth 2 (two) times daily.   Yes [provider]  citalopram (CELEXA) 10 MG tablet Take 1 tablet (10 mg total) by mouth daily. 03/17/21   Samella Parr, NP  ferrous sulfate 325 (65 FE) MG tablet Take 1 tablet (325 mg total) by mouth 3 (three) times daily with meals. 03/16/21   Samella Parr, NP  insulin aspart (NOVOLOG) 100 UNIT/ML injection Inject 0-6 Units into the skin 3 (three) times daily with meals. Do NOT hold if patient is NPO. Patient not taking: Reported on 07/18/2021 03/16/21   Samella Parr, NP  metFORMIN (GLUCOPHAGE) 500 MG tablet TAKE 1 TABLET BY MOUTH TWICE DAILY WITH A MEAL Patient not taking: Reported on 07/18/2021 01/09/21   Wendie Agreste, MD  midodrine (PROAMATINE) 2.5  MG tablet Take 1 tablet (2.5 mg total) by mouth 2 (two) times daily with a meal. 03/16/21   Samella Parr, NP  Multiple Vitamin (MULTIVITAMIN WITH MINERALS) TABS tablet Take 1 tablet by mouth daily. Patient not taking: Reported on 07/18/2021 03/17/21   Samella Parr, NP  paliperidone (INVEGA) 1.5 MG 24 hr tablet Take 1.5 mg by mouth daily.    [provider]  pravastatin (PRAVACHOL) 20 MG tablet Take 1 tablet (20 mg total) by mouth at bedtime. Patient not taking: Reported on 07/18/2021 01/08/20   Wendie Agreste, MD  Psyllium (METAMUCIL PO) Take 1 Scoop by mouth daily as needed (constipation). Patient not taking: Reported on 07/18/2021    [provider]  QUEtiapine (SEROQUEL) 50 MG tablet Take 1 tablet (50 mg total) by mouth at bedtime. Patient not taking: Reported on 07/18/2021 03/16/21   Samella Parr, NP  rivastigmine (EXELON) 4.6 mg/24hr Place 4.6 mg onto the skin daily.    [provider]  rosuvastatin (CRESTOR) 5 MG tablet Take 5 mg by mouth daily. 03/17/21   [provider]  senna-docusate (SENOKOT-S) 8.6-50 MG tablet Take 1 tablet by mouth at bedtime as needed for mild constipation. Patient not taking: Reported on 07/18/2021 03/16/21   Samella Parr, NP    Allergies    Ace inhibitors  Review of Systems   Review of Systems  Unable to perform ROS: Dementia   Physical Exam Updated Vital Signs BP (!) 149/68   Pulse 68   Temp 98.4 F (36.9 C)   Resp 12   Ht 5\' 9"  (1.753 m)   Wt 79.4 kg   SpO2 100%   BMI 25.84 kg/m   Physical Exam Vitals and nursing note reviewed.  Constitutional:      General: He is not in acute distress.    Appearance: He is well-developed.  HENT:     Head: Normocephalic and atraumatic.  Eyes:     Conjunctiva/sclera: Conjunctivae normal.  Cardiovascular:     Rate and Rhythm: Normal rate and regular rhythm.     Heart sounds: No murmur heard. Pulmonary:     Effort: Pulmonary effort is normal. No  respiratory distress.     Breath sounds: Normal breath sounds.  Abdominal:     Palpations: Abdomen is soft.     Tenderness: There is no abdominal tenderness.  Genitourinary:    Comments: Tech chaperone Stool brown Musculoskeletal:        General: No swelling, deformity or signs of injury.     Cervical back: Neck supple.  Skin:    General: Skin is warm and dry.     Capillary Refill: Capillary refill takes less than 2 seconds.  Neurological:     Mental Status: He is alert.     Comments: Alert, oriented to person but not time  Psychiatric:        Mood and Affect: Mood normal.     Comments: Pleasant    ED Results / Procedures / Treatments   Labs (all labs ordered are listed, but only abnormal results are displayed) Labs Reviewed  CBC WITH DIFFERENTIAL/PLATELET - Abnormal; Notable for the following components:      Result Value   WBC 2.8 (*)    RBC 2.81 (*)    Hemoglobin 7.3 (*)    HCT 25.2 (*)    MCHC 29.0 (*)    RDW 17.6 (*)    Platelets 60 (*)    Lymphs Abs 0.5 (*)    All other components within normal limits  BASIC METABOLIC PANEL - Abnormal; Notable for the following components:   Glucose, Bld 154 (*)    Calcium 8.5 (*)    All other components within normal limits  PROTIME-INR - Abnormal; Notable for the following components:   Prothrombin Time 16.6 (*)    INR 1.4 (*)    All other components within normal limits  POC OCCULT BLOOD, ED - Abnormal; Notable for the following components:   Fecal Occult Bld POSITIVE (*)    All other components within normal limits  TYPE AND SCREEN    EKG None  Radiology No results found.  Procedures Procedures   Medications Ordered in ED Medications - No data to display  ED Course  I have reviewed the triage vital signs and the nursing notes.  Pertinent labs & imaging results that were available during my care of the patient were reviewed by me and considered in my medical decision making (see chart for details).  MDM  Rules/Calculators/A&P                          75 year old male with prior history hepatocellular carcinoma, pancytopenia, followed by heme-onc for his chronic anemia presenting to the ER with concern for low hemoglobin.  From nursing facility.  Patient denies any other medical complaints.  No other medical complaints reported except for low hemoglobin.  Patient appears well at present, has normal vital signs.  Repeat hemoglobin today is 7.3.  Did Hemoccult test, stool was brown and very faintly Hemoccult positive.  Notably creatinine and BUN normal.  I reviewed the case in detail with Dr. Delton Coombes.  Given overall clinical picture, likely chronic nature of patient's anemia, Dr. Raliegh Ip recommends outpatient management, follow-up in the outpatient setting with oncology.  Discussed these recommendations with patient and patient's POA over the phone.  Instructed that they should contact his heme-onc doctor to schedule a follow-up appointment for further monitoring of the hemoglobin levels.  Final Clinical Impression(s) / ED Diagnoses Final diagnoses:  Anemia, unspecified type    Rx / DC Orders ED Discharge Orders     None        Lucrezia Starch, MD 07/20/21 [phone]

## 2021-07-19 LAB — BPAM RBC
Blood Product Expiration Date: 202212202359
Blood Product Expiration Date: 202212202359
Unit Type and Rh: 7300
Unit Type and Rh: 7300

## 2021-07-19 LAB — TYPE AND SCREEN
ABO/RH(D): B POS
Antibody Screen: POSITIVE
Donor AG Type: NEGATIVE
Donor AG Type: NEGATIVE
Unit division: 0
Unit division: 0

## 2021-07-27 ENCOUNTER — Telehealth: Payer: Self-pay | Admitting: Oncology

## 2021-07-27 ENCOUNTER — Telehealth: Payer: Self-pay | Admitting: *Deleted

## 2021-07-27 NOTE — Telephone Encounter (Signed)
Call from facility that patient was in ER at [facility] on 07/18/21 for low blood count. Was transfused and sent back to facility. He has missed last appointment at [facility] and he was calling for an appointment. Scheduling message sent to have lab/OV in next 1-2 weeks.

## 2021-08-04 ENCOUNTER — Telehealth: Payer: Self-pay | Admitting: Oncology

## 2021-08-04 NOTE — Telephone Encounter (Signed)
Rescheduled 08/11/21 appt due to provider request. Left message for patient with appt date and time.

## 2021-08-11 ENCOUNTER — Inpatient Hospital Stay: Payer: Medicare Other

## 2021-08-11 ENCOUNTER — Telehealth: Payer: Self-pay | Admitting: Nurse Practitioner

## 2021-08-11 ENCOUNTER — Telehealth: Payer: Self-pay | Admitting: *Deleted

## 2021-08-11 ENCOUNTER — Inpatient Hospital Stay: Payer: Medicare Other | Admitting: Nurse Practitioner

## 2021-08-11 NOTE — Telephone Encounter (Signed)
Called patient , no answer . Left message for patient to call back to reschedule for todays appointment if unable to come in.

## 2021-08-11 NOTE — Telephone Encounter (Signed)
[facility] to confirm patient still resides there. Scheduled lab/OV for 09/05/21 at 12:45. Staff member, Vida Roller took down appointment information and they will transport.

## 2021-09-05 ENCOUNTER — Inpatient Hospital Stay: Payer: Medicare Other

## 2021-09-05 ENCOUNTER — Inpatient Hospital Stay: Payer: Medicare Other | Admitting: Nurse Practitioner

## 2021-09-18 ENCOUNTER — Inpatient Hospital Stay: Payer: Medicare Other

## 2021-09-18 ENCOUNTER — Inpatient Hospital Stay: Payer: Medicare Other | Admitting: Nurse Practitioner

## 2021-10-09 ENCOUNTER — Inpatient Hospital Stay (HOSPITAL_BASED_OUTPATIENT_CLINIC_OR_DEPARTMENT_OTHER): Payer: Medicare Other | Admitting: Nurse Practitioner

## 2021-10-09 ENCOUNTER — Telehealth: Payer: Self-pay

## 2021-10-09 ENCOUNTER — Other Ambulatory Visit: Payer: Self-pay

## 2021-10-09 ENCOUNTER — Inpatient Hospital Stay: Payer: Medicare Other | Attending: Oncology

## 2021-10-09 ENCOUNTER — Encounter: Payer: Self-pay | Admitting: Nurse Practitioner

## 2021-10-09 VITALS — BP 128/59 | HR 81 | Temp 98.7°F | Resp 20 | Ht 69.0 in | Wt 169.2 lb

## 2021-10-09 DIAGNOSIS — E119 Type 2 diabetes mellitus without complications: Secondary | ICD-10-CM | POA: Insufficient documentation

## 2021-10-09 DIAGNOSIS — C220 Liver cell carcinoma: Secondary | ICD-10-CM | POA: Insufficient documentation

## 2021-10-09 DIAGNOSIS — K7460 Unspecified cirrhosis of liver: Secondary | ICD-10-CM | POA: Insufficient documentation

## 2021-10-09 DIAGNOSIS — Z79899 Other long term (current) drug therapy: Secondary | ICD-10-CM | POA: Diagnosis not present

## 2021-10-09 DIAGNOSIS — D500 Iron deficiency anemia secondary to blood loss (chronic): Secondary | ICD-10-CM

## 2021-10-09 DIAGNOSIS — B1910 Unspecified viral hepatitis B without hepatic coma: Secondary | ICD-10-CM | POA: Diagnosis not present

## 2021-10-09 DIAGNOSIS — D61818 Other pancytopenia: Secondary | ICD-10-CM | POA: Insufficient documentation

## 2021-10-09 LAB — CBC WITH DIFFERENTIAL (CANCER CENTER ONLY)
Abs Immature Granulocytes: 0 10*3/uL (ref 0.00–0.07)
Basophils Absolute: 0 10*3/uL (ref 0.0–0.1)
Basophils Relative: 1 %
Eosinophils Absolute: 0.1 10*3/uL (ref 0.0–0.5)
Eosinophils Relative: 4 %
HCT: 22.1 % — ABNORMAL LOW (ref 39.0–52.0)
Hemoglobin: 6.5 g/dL — CL (ref 13.0–17.0)
Immature Granulocytes: 0 %
Lymphocytes Relative: 23 %
Lymphs Abs: 0.5 10*3/uL — ABNORMAL LOW (ref 0.7–4.0)
MCH: 26.2 pg (ref 26.0–34.0)
MCHC: 29.4 g/dL — ABNORMAL LOW (ref 30.0–36.0)
MCV: 89.1 fL (ref 80.0–100.0)
Monocytes Absolute: 0.3 10*3/uL (ref 0.1–1.0)
Monocytes Relative: 12 %
Neutro Abs: 1.4 10*3/uL — ABNORMAL LOW (ref 1.7–7.7)
Neutrophils Relative %: 60 %
Platelet Count: 59 10*3/uL — ABNORMAL LOW (ref 150–400)
RBC: 2.48 MIL/uL — ABNORMAL LOW (ref 4.22–5.81)
RDW: 16.1 % — ABNORMAL HIGH (ref 11.5–15.5)
WBC Count: 2.3 10*3/uL — ABNORMAL LOW (ref 4.0–10.5)
nRBC: 0 % (ref 0.0–0.2)

## 2021-10-09 LAB — SAMPLE TO BLOOD BANK

## 2021-10-09 LAB — FERRITIN: Ferritin: 6 ng/mL — ABNORMAL LOW (ref 24–336)

## 2021-10-09 LAB — PREPARE RBC (CROSSMATCH)

## 2021-10-09 NOTE — Telephone Encounter (Signed)
CRITICAL VALUE STICKER  CRITICAL VALUE: HGB 6.5  RECEIVER (on-site recipient of call): Lenox Ponds LPN  DATE & TIME NOTIFIED: 10/09/21 1052  MESSENGER (representative from lab): Steward Drone   MD NOTIFIED: Leander Rams NP   TIME OF NOTIFICATION: 7919   RESPONSE:  Provider Notified

## 2021-10-09 NOTE — Progress Notes (Signed)
[place] OFFICE PROGRESS NOTE   Diagnosis:  Cirrhosis, pancytopenia, hepatocellular carcinoma  INTERVAL HISTORY:   [name] returns for follow-up.  He was last seen at the [facility] June 2022.  He is now residing at a nursing facility.  Energy level is poor.  He denies bleeding.  Objective:  Vital signs in last 24 hours:  Blood pressure (!) 128/59, pulse 81, temperature 98.7 F (37.1 C), temperature source Oral, resp. rate 20, height 5\' 9"  (1.753 m), weight 169 lb 3.2 oz (76.7 kg), SpO2 100 %.    Resp: Lungs clear bilaterally. Cardio: Regular rate and rhythm. GI: Abdomen soft and nontender.  No hepatosplenomegaly. Vascular: No leg edema. Neuro: Alert.  Follows commands. Skin: Conjunctival pallor.   Lab Results:  Lab Results  Component Value Date   WBC 2.3 (L) 10/09/2021   HGB 6.5 (LL) 10/09/2021   HCT 22.1 (L) 10/09/2021   MCV 89.1 10/09/2021   PLT 59 (L) 10/09/2021   NEUTROABS 1.4 (L) 10/09/2021    Imaging:  No results found.  Medications: I have reviewed the patient's current medications.  Assessment/Plan: Hepatocellular carcinoma, stage I (T1 NX) status post partial left hepatectomy 03/02/2013. 08/31/2013 AFP 2.4.   CT abdomen 08/31/2013 with postoperative changes of partial hepatectomy with small postoperative fluid collection along the resection margin. No definite signs to suggest residual or locally recurrent disease. Several borderline enlarged and minimally enlarged lymph nodes superior to the liver in the juxtapericardiac fat of the lower anterior mediastinum with the largest measuring 11 mm slightly increased compared to the prior study 01/15/2013. Several prominent non-pathologically enlarged upper abdominal ligament lymph nodes similar to the prior study. Small esophageal varices.   CT abdomen 03/03/2012 without evidence of recurrent hepatocellular carcinoma, stable right lung base nodule CT the abdomen and pelvis 09/06/2014  without evidence of recurrent hepatocellular carcinoma, slight enlargement of pre-cardiac lymph nodes CT abdomen/pelvis 09/07/2015 with no findings for recurrent tumor or metastatic disease. Stable small 3 mm right middle lobe pulmonary nodule since 2014. Epicardial lymph node measured 11 mm, previously 13 mm. Stable cirrhotic changes. Fairly significant esophageal varices. 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 Hepatitis B core antibody positive. Diabetes. Severe microcytic anemia-likely iron deficiency anemia-progressive 06/03/2017. Stool Hemoccult positive 3 06/05/2017. Colonoscopy 12/27/2016-external and internal hemorrhoids. Diverticulosis in the sigmoid, descending and transverse colon. One medium polyp in the distal descending colon. 3 small polyps in the transverse colon. 2 diminutive polyps in the rectum and the proximal ascending colon. Medium-sized lipoma transverse colon. Small lipoma mid ascending colon. Multiple nonbleeding colonic angiodysplastic lesions. Pathology-tubular adenomas, hyperplastic polyps. Pancytopenia secondary to cirrhosis and GI bleeding Laparoscopic cholecystectomy 01/28/2018 Decreased iron stores 10/15/2018; transfused 2 units of blood 10/18/2018 and 06/01/2020; Feraheme 11/04/2018 and 11/11/2018; Feraheme 02/18/2019 and 02/25/2019, 02/15/2020, 02/29/2020, 06/10/2020, 06/17/2020, 09/21/2020, 09/28/2020, 01/02/2021    Disposition: [name] appears  unchanged.  He is now residing at a skilled nursing facility.  We reviewed the CBC from today.  He has progressive anemia.  This is likely due to chronic GI blood loss as well as cirrhosis.  He is symptomatic with weakness.  He agrees to a blood transfusion.  I contacted his niece [name], healthcare power of attorney, by phone.  We discussed the potential for an allergic reaction, infection.  She consents to the blood transfusion.  Instructions to facility including increase oral iron to 1 tablet daily, check CBC in 6 weeks and fax to our office, arrange for follow-up with Dr. Watt Climes, gastroenterology.  He will return for lab and an office visit here in 3 months.  We are available to see him sooner if needed.    Ned Card ANP/GNP-BC   10/09/2021  11:33 AM

## 2021-10-10 ENCOUNTER — Telehealth: Payer: Self-pay

## 2021-10-10 ENCOUNTER — Telehealth: Payer: Self-pay | Admitting: Nurse Practitioner

## 2021-10-10 NOTE — Telephone Encounter (Signed)
Patient is schedule to receive Feraheme 30 minute before his blood transfusion

## 2021-10-10 NOTE — Telephone Encounter (Signed)
I spoke with Mr. Seeman's healthcare power of attorney, niece Hebert Dooling, regarding the low ferritin level.  We discussed IV iron which he has had in the past.  She agrees with our plan to give IV iron tomorrow when he is here for blood.

## 2021-10-10 NOTE — Telephone Encounter (Signed)
-----   Message from [name], NP sent at 10/10/2021  8:23 AM EST ----- Please see if we can give him IV iron when he has here for the blood transfusion.  We have given him Feraheme in the past.

## 2021-10-11 ENCOUNTER — Encounter: Payer: Self-pay | Admitting: *Deleted

## 2021-10-11 ENCOUNTER — Other Ambulatory Visit: Payer: Self-pay

## 2021-10-11 ENCOUNTER — Inpatient Hospital Stay: Payer: Medicare Other

## 2021-10-11 VITALS — BP 124/60 | HR 74 | Temp 98.1°F | Resp 18

## 2021-10-11 DIAGNOSIS — C220 Liver cell carcinoma: Secondary | ICD-10-CM | POA: Diagnosis not present

## 2021-10-11 DIAGNOSIS — D500 Iron deficiency anemia secondary to blood loss (chronic): Secondary | ICD-10-CM

## 2021-10-11 LAB — CMP (CANCER CENTER ONLY)
ALT: 22 U/L (ref 0–44)
AST: 27 U/L (ref 15–41)
Albumin: 2.9 g/dL — ABNORMAL LOW (ref 3.5–5.0)
Alkaline Phosphatase: 101 U/L (ref 38–126)
Anion gap: 6 (ref 5–15)
BUN: 16 mg/dL (ref 8–23)
CO2: 23 mmol/L (ref 22–32)
Calcium: 8.7 mg/dL — ABNORMAL LOW (ref 8.9–10.3)
Chloride: 109 mmol/L (ref 98–111)
Creatinine: 1.1 mg/dL (ref 0.61–1.24)
GFR, Estimated: >60 mL/min (ref >60)
Glucose, Bld: 208 mg/dL — ABNORMAL HIGH (ref 70–99)
Potassium: 4.5 mmol/L (ref 3.5–5.1)
Sodium: 138 mmol/L (ref 135–145)
Total Bilirubin: 1.1 mg/dL (ref 0.3–1.2)
Total Protein: 6.1 g/dL — ABNORMAL LOW (ref 6.5–8.1)

## 2021-10-11 LAB — CBC WITH DIFFERENTIAL (CANCER CENTER ONLY)
Abs Immature Granulocytes: 0.01 10*3/uL (ref 0.00–0.07)
Basophils Absolute: 0 10*3/uL (ref 0.0–0.1)
Basophils Relative: 1 %
Eosinophils Absolute: 0.1 10*3/uL (ref 0.0–0.5)
Eosinophils Relative: 4 %
HCT: 21.8 % — ABNORMAL LOW (ref 39.0–52.0)
Hemoglobin: 6.5 g/dL — CL (ref 13.0–17.0)
Immature Granulocytes: 1 %
Lymphocytes Relative: 26 %
Lymphs Abs: 0.6 10*3/uL — ABNORMAL LOW (ref 0.7–4.0)
MCH: 26.4 pg (ref 26.0–34.0)
MCHC: 29.8 g/dL — ABNORMAL LOW (ref 30.0–36.0)
MCV: 88.6 fL (ref 80.0–100.0)
Monocytes Absolute: 0.2 10*3/uL (ref 0.1–1.0)
Monocytes Relative: 11 %
Neutro Abs: 1.3 10*3/uL — ABNORMAL LOW (ref 1.7–7.7)
Neutrophils Relative %: 57 %
Platelet Count: 54 10*3/uL — ABNORMAL LOW (ref 150–400)
RBC: 2.46 MIL/uL — ABNORMAL LOW (ref 4.22–5.81)
RDW: 16.1 % — ABNORMAL HIGH (ref 11.5–15.5)
WBC Count: 2.2 10*3/uL — ABNORMAL LOW (ref 4.0–10.5)
nRBC: 0 % (ref 0.0–0.2)

## 2021-10-11 MED ORDER — SODIUM CHLORIDE 0.9 % IV SOLN
Freq: Once | INTRAVENOUS | Status: AC
Start: 2021-10-11 — End: 2021-10-11

## 2021-10-11 MED ORDER — SODIUM CHLORIDE 0.9 % IV SOLN
510.0000 mg | Freq: Once | INTRAVENOUS | Status: AC
  Administered 2021-10-11: 510 mg via INTRAVENOUS
  Filled 2021-10-11: qty 17

## 2021-10-11 MED ORDER — SODIUM CHLORIDE 0.9% IV SOLUTION
250.0000 mL | Freq: Once | INTRAVENOUS | Status: AC
  Administered 2021-10-11: 250 mL via INTRAVENOUS

## 2021-10-11 NOTE — Patient Instructions (Signed)
Ferumoxytol Injection What is this medication? FERUMOXYTOL (FER ue MOX i tol) treats low levels of iron in your body (iron deficiency anemia). Iron is a mineral that plays an important role in making red blood cells, which carry oxygen from your lungs to the rest of your body. This medicine may be used for other purposes; ask your health care provider or pharmacist if you have questions. COMMON BRAND NAME(S): Feraheme What should I tell my care team before I take this medication? They need to know if you have any of these conditions: Anemia not caused by low iron levels High levels of iron in the blood Magnetic resonance imaging (MRI) test scheduled An unusual or allergic reaction to iron, other medications, foods, dyes, or preservatives Pregnant or trying to get pregnant Breast-feeding How should I use this medication? This medication is for injection into a vein. It is given in a hospital or clinic setting. Talk to your care team the use of this medication in children. Special care may be needed. Overdosage: If you think you have taken too much of this medicine contact a poison control center or emergency room at once. NOTE: This medicine is only for you. Do not share this medicine with others. What if I miss a dose? It is important not to miss your dose. Call your care team if you are unable to keep an appointment. What may interact with this medication? Other iron products This list may not describe all possible interactions. Give your health care provider a list of all the medicines, herbs, non-prescription drugs, or dietary supplements you use. Also tell them if you smoke, drink alcohol, or use illegal drugs. Some items may interact with your medicine. What should I watch for while using this medication? Visit your care team regularly. Tell your care team if your symptoms do not start to get better or if they get worse. You may need blood work done while you are taking this  medication. You may need to follow a special diet. Talk to your care team. Foods that contain iron include: whole grains/cereals, dried fruits, beans, or peas, leafy green vegetables, and organ meats (liver, kidney). What side effects may I notice from receiving this medication? Side effects that you should report to your care team as soon as possible: Allergic reactions--skin rash, itching, hives, swelling of the face, lips, tongue, or throat Low blood pressure--dizziness, feeling faint or lightheaded, blurry vision Shortness of breath Side effects that usually do not require medical attention (report to your care team if they continue or are bothersome): Flushing Headache Joint pain Muscle pain Nausea Pain, redness, or irritation at injection site This list may not describe all possible side effects. Call your doctor for medical advice about side effects. You may report side effects to FDA at 1-800-FDA-1088. Where should I keep my medication? This medication is given in a hospital or clinic and will not be stored at home. NOTE: This sheet is a summary. It may not cover all possible information. If you have questions about this medicine, talk to your doctor, pharmacist, or health care provider.  2022 Elsevier/Gold Standard (2020-12-30 00:00:00)  Blood Transfusion, Adult, Care After This sheet gives you information about how to care for yourself after your procedure. Your doctor may also give you more specific instructions. If you have problems or questions, contact your doctor. What can I expect after the procedure? After the procedure, it is common to have: Bruising and soreness at the IV site. A headache. Follow  these instructions at home: Insertion site care   Follow instructions from your doctor about how to take care of your insertion site. This is where an IV tube was put into your vein. Make sure you: Wash your hands with soap and water before and after you change your bandage  (dressing). If you cannot use soap and water, use hand sanitizer. Change your bandage as told by your doctor. Check your insertion site every day for signs of infection. Check for: Redness, swelling, or pain. Bleeding from the site. Warmth. Pus or a bad smell. General instructions Take over-the-counter and prescription medicines only as told by your doctor. Rest as told by your doctor. Go back to your normal activities as told by your doctor. Keep all follow-up visits as told by your doctor. This is important. Contact a doctor if: You have itching or red, swollen areas of skin (hives). You feel worried or nervous (anxious). You feel weak after doing your normal activities. You have redness, swelling, warmth, or pain around the insertion site. You have blood coming from the insertion site, and the blood does not stop with pressure. You have pus or a bad smell coming from the insertion site. Get help right away if: You have signs of a serious reaction. This may be coming from an allergy or the body's defense system (immune system). Signs include: Trouble breathing or shortness of breath. Swelling of the face or feeling warm (flushed). Fever or chills. Head, chest, or back pain. Dark pee (urine) or blood in the pee. Widespread rash. Fast heartbeat. Feeling dizzy or light-headed. You may receive your blood transfusion in an outpatient setting. If so, you will be told whom to contact to report any reactions. These symptoms may be an emergency. Do not wait to see if the symptoms will go away. Get medical help right away. Call your local emergency services (911 in the [place] Do not drive yourself to the hospital. Summary Bruising and soreness at the IV site are common. Check your insertion site every day for signs of infection. Rest as told by your doctor. Go back to your normal activities as told by your doctor. Get help right away if you have signs of a serious reaction. This  information is not intended to replace advice given to you by your health care provider. Make sure you discuss any questions you have with your health care provider. Document Revised: 12/01/2020 Document Reviewed: 01/29/2019 Elsevier Patient Education  [place].

## 2021-10-11 NOTE — Progress Notes (Signed)
CRITICAL VALUE STICKER  CRITICAL VALUE: Hgb 6.5  RECEIVER (on-site recipient of call):Winola Drum,RN  DATE & TIME NOTIFIED: 10/11/21 @ 0904  MESSENGER (representative from lab):Otila Kluver  MD NOTIFIED: Dr. Benay Spice  TIME OF NOTIFICATION: 2244  RESPONSE: Receiving 2 units blood today and feraheme. Will repeat feraheme next week. SNF to re-check Hgb in 6 weeks and fax results

## 2021-10-12 LAB — BPAM RBC
Blood Product Expiration Date: 202303222359
Blood Product Expiration Date: 202303232359
ISSUE DATE / TIME: 202302220730
ISSUE DATE / TIME: 202302220730
Unit Type and Rh: 5100
Unit Type and Rh: 5100

## 2021-10-12 LAB — TYPE AND SCREEN
ABO/RH(D): B POS
Antibody Screen: POSITIVE
Donor AG Type: NEGATIVE
Donor AG Type: NEGATIVE
Unit division: 0
Unit division: 0

## 2021-10-18 ENCOUNTER — Other Ambulatory Visit: Payer: Self-pay

## 2021-10-18 ENCOUNTER — Inpatient Hospital Stay: Payer: Medicare Other | Attending: Oncology

## 2021-10-18 VITALS — BP 150/67 | HR 73 | Temp 97.7°F | Resp 18

## 2021-10-18 DIAGNOSIS — K7460 Unspecified cirrhosis of liver: Secondary | ICD-10-CM | POA: Diagnosis present

## 2021-10-18 DIAGNOSIS — C220 Liver cell carcinoma: Secondary | ICD-10-CM | POA: Insufficient documentation

## 2021-10-18 DIAGNOSIS — D500 Iron deficiency anemia secondary to blood loss (chronic): Secondary | ICD-10-CM

## 2021-10-18 DIAGNOSIS — Z79899 Other long term (current) drug therapy: Secondary | ICD-10-CM | POA: Diagnosis not present

## 2021-10-18 DIAGNOSIS — D61818 Other pancytopenia: Secondary | ICD-10-CM | POA: Diagnosis present

## 2021-10-18 DIAGNOSIS — D509 Iron deficiency anemia, unspecified: Secondary | ICD-10-CM | POA: Insufficient documentation

## 2021-10-18 MED ORDER — SODIUM CHLORIDE 0.9 % IV SOLN
510.0000 mg | Freq: Once | INTRAVENOUS | Status: AC
  Administered 2021-10-18: 510 mg via INTRAVENOUS
  Filled 2021-10-18: qty 17

## 2021-10-18 MED ORDER — SODIUM CHLORIDE 0.9 % IV SOLN: Freq: Once | INTRAVENOUS | Status: AC

## 2021-10-18 NOTE — Patient Instructions (Signed)

## 2021-11-22 ENCOUNTER — Telehealth: Payer: Self-pay | Admitting: *Deleted

## 2021-11-22 NOTE — Telephone Encounter (Signed)
Spoke w/nurse at [facility] and was informed that CBC was drawn on 4/04. Requested it be faxed to [phone] and nurse agrees to do so. ?

## 2021-11-23 NOTE — Telephone Encounter (Addendum)
Faxed request to [facility] to send his 11/21/21 CBC results to [phone] ?11/24/21: received results from [facility] ([facility] and forwarded to MD. Stable. ?

## 2021-11-24 ENCOUNTER — Encounter: Payer: Self-pay | Admitting: Nurse Practitioner

## 2022-01-01 ENCOUNTER — Other Ambulatory Visit: Payer: Self-pay

## 2022-01-01 ENCOUNTER — Ambulatory Visit: Payer: Self-pay | Admitting: Oncology

## 2022-01-05 ENCOUNTER — Inpatient Hospital Stay: Payer: Medicare Other | Attending: Oncology

## 2022-01-05 ENCOUNTER — Inpatient Hospital Stay (HOSPITAL_BASED_OUTPATIENT_CLINIC_OR_DEPARTMENT_OTHER): Payer: Medicare Other | Admitting: Oncology

## 2022-01-05 ENCOUNTER — Other Ambulatory Visit: Payer: Self-pay | Admitting: *Deleted

## 2022-01-05 VITALS — BP 163/69 | HR 87 | Temp 98.2°F | Resp 18 | Ht 69.0 in | Wt 177.8 lb

## 2022-01-05 DIAGNOSIS — D500 Iron deficiency anemia secondary to blood loss (chronic): Secondary | ICD-10-CM | POA: Diagnosis not present

## 2022-01-05 DIAGNOSIS — K648 Other hemorrhoids: Secondary | ICD-10-CM | POA: Diagnosis not present

## 2022-01-05 DIAGNOSIS — K644 Residual hemorrhoidal skin tags: Secondary | ICD-10-CM | POA: Diagnosis not present

## 2022-01-05 DIAGNOSIS — C220 Liver cell carcinoma: Secondary | ICD-10-CM | POA: Insufficient documentation

## 2022-01-05 DIAGNOSIS — I8510 Secondary esophageal varices without bleeding: Secondary | ICD-10-CM | POA: Insufficient documentation

## 2022-01-05 DIAGNOSIS — D61818 Other pancytopenia: Secondary | ICD-10-CM | POA: Insufficient documentation

## 2022-01-05 DIAGNOSIS — K5521 Angiodysplasia of colon with hemorrhage: Secondary | ICD-10-CM | POA: Diagnosis not present

## 2022-01-05 DIAGNOSIS — K7460 Unspecified cirrhosis of liver: Secondary | ICD-10-CM | POA: Insufficient documentation

## 2022-01-05 DIAGNOSIS — L738 Other specified follicular disorders: Secondary | ICD-10-CM

## 2022-01-05 DIAGNOSIS — E119 Type 2 diabetes mellitus without complications: Secondary | ICD-10-CM | POA: Diagnosis not present

## 2022-01-05 DIAGNOSIS — Z79899 Other long term (current) drug therapy: Secondary | ICD-10-CM | POA: Diagnosis not present

## 2022-01-05 DIAGNOSIS — D649 Anemia, unspecified: Secondary | ICD-10-CM | POA: Diagnosis not present

## 2022-01-05 LAB — SAMPLE TO BLOOD BANK

## 2022-01-05 LAB — CBC WITH DIFFERENTIAL (CANCER CENTER ONLY)
Abs Immature Granulocytes: 0 10*3/uL (ref 0.00–0.07)
Basophils Absolute: 0 10*3/uL (ref 0.0–0.1)
Basophils Relative: 1 %
Eosinophils Absolute: 0.1 10*3/uL (ref 0.0–0.5)
Eosinophils Relative: 3 %
HCT: 27.5 % — ABNORMAL LOW (ref 39.0–52.0)
Hemoglobin: 8.4 g/dL — ABNORMAL LOW (ref 13.0–17.0)
Immature Granulocytes: 0 %
Lymphocytes Relative: 20 %
Lymphs Abs: 0.5 10*3/uL — ABNORMAL LOW (ref 0.7–4.0)
MCH: 29.2 pg (ref 26.0–34.0)
MCHC: 30.5 g/dL (ref 30.0–36.0)
MCV: 95.5 fL (ref 80.0–100.0)
Monocytes Absolute: 0.3 10*3/uL (ref 0.1–1.0)
Monocytes Relative: 11 %
Neutro Abs: 1.7 10*3/uL (ref 1.7–7.7)
Neutrophils Relative %: 65 %
Platelet Count: 57 10*3/uL — ABNORMAL LOW (ref 150–400)
RBC: 2.88 MIL/uL — ABNORMAL LOW (ref 4.22–5.81)
RDW: 14.8 % (ref 11.5–15.5)
WBC Count: 2.6 10*3/uL — ABNORMAL LOW (ref 4.0–10.5)
nRBC: 0 % (ref 0.0–0.2)

## 2022-01-05 LAB — FERRITIN: Ferritin: 12 ng/mL — ABNORMAL LOW (ref 24–336)

## 2022-01-05 NOTE — Progress Notes (Signed)
[place] OFFICE PROGRESS NOTE   Diagnosis: Hepatocellular carcinoma, cirrhosis, anemia  INTERVAL HISTORY:   [name] returns as scheduled.  He received IV iron and a Red cell transfusion in February.  He reports black stool.  He is taking lactulose and iron.  He resides in a nursing facility. [name] complains of intermittent "stinging "discomfort at the right upper back.  He has noted a "knot "in this area.  Objective:  Vital signs in last 24 hours:  Blood pressure (!) 163/69, pulse 87, temperature 98.2 F (36.8 C), temperature source Oral, resp. rate 18, height '5\' 9"'$  (1.753 m), weight 177 lb 12.8 oz (80.6 kg), SpO2 98 %.    Lymphatics: No cervical, supraclavicular, or axillary nodes Resp: Decreased breath sounds with end inspiratory rhonchi at the right lower posterior chest, no respiratory distress Cardio: Regular rate and rhythm GI: No hepatosplenomegaly, no apparent ascites, nontender Vascular: No leg edema  Skin: 3-4 cm firm mobile cutaneous lesion at the right upper back with mild overlying erythema, central fluctuance Neurologic: Alert, not oriented to date, follows commands  Lab Results:  Lab Results  Component Value Date   WBC 2.6 (L) 01/05/2022   HGB 8.4 (L) 01/05/2022   HCT 27.5 (L) 01/05/2022   MCV 95.5 01/05/2022   PLT 57 (L) 01/05/2022   NEUTROABS 1.7 01/05/2022    CMP  Lab Results  Component Value Date   NA 138 10/11/2021   K 4.5 10/11/2021   CL 109 10/11/2021   CO2 23 10/11/2021   GLUCOSE 208 (H) 10/11/2021   BUN 16 10/11/2021   CREATININE 1.10 10/11/2021   CALCIUM 8.7 (L) 10/11/2021   PROT 6.1 (L) 10/11/2021   ALBUMIN 2.9 (L) 10/11/2021   AST 27 10/11/2021   ALT 22 10/11/2021   ALKPHOS 101 10/11/2021   BILITOT 1.1 10/11/2021   GFRNONAA >60 10/11/2021   GFRAA 75 09/01/2020  Ferritin-12  Lab Results  Component Value Date   CEA 2.7 01/16/2013    Medications: I have reviewed the patient's current  medications.   Assessment/Plan:  Hepatocellular carcinoma, stage I (T1 NX) status post partial left hepatectomy 03/02/2013. 08/31/2013 AFP 2.4.   CT abdomen 08/31/2013 with postoperative changes of partial hepatectomy with small postoperative fluid collection along the resection margin. No definite signs to suggest residual or locally recurrent disease. Several borderline enlarged and minimally enlarged lymph nodes superior to the liver in the juxtapericardiac fat of the lower anterior mediastinum with the largest measuring 11 mm slightly increased compared to the prior study 01/15/2013. Several prominent non-pathologically enlarged upper abdominal ligament lymph nodes similar to the prior study. Small esophageal varices.   CT abdomen 03/03/2012 without evidence of recurrent hepatocellular carcinoma, stable right lung base nodule CT the abdomen and pelvis 09/06/2014 without evidence of recurrent hepatocellular carcinoma, slight enlargement of pre-cardiac lymph nodes CT abdomen/pelvis 09/07/2015 with no findings for recurrent tumor or metastatic disease. Stable small 3 mm right middle lobe pulmonary nodule since 2014. Epicardial lymph node measured 11 mm, previously 13 mm. Stable cirrhotic changes. Fairly significant esophageal varices. 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 Hepatitis B core antibody positive. Diabetes. Severe microcytic  anemia-likely iron deficiency  anemia-progressive 06/03/2017. Stool Hemoccult positive 3 06/05/2017. Colonoscopy 12/27/2016-external and internal hemorrhoids. Diverticulosis in the sigmoid, descending and transverse colon. One medium polyp in the distal descending colon. 3 small polyps in the transverse colon. 2 diminutive polyps in the rectum and the proximal ascending colon. Medium-sized lipoma transverse colon. Small lipoma mid ascending colon. Multiple nonbleeding colonic angiodysplastic lesions. Pathology-tubular adenomas, hyperplastic polyps. 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  The ferritin is low.  He is not symptomatic from the anemia at present.  He will be scheduled for IV iron next week and the following week.  He will return for an office visit and CBC in 2 months.  He appears to have an inflamed sebaceous cyst of the right upper back.  We will refer him to the surgery office to consider incision and drainage.  [name] has significant dementia.  I do not recommend surveillance imaging for the hepatocellular carcinoma.  Edward Coder, MD  01/05/2022  12:15 PM

## 2022-01-08 ENCOUNTER — Inpatient Hospital Stay: Payer: Medicare Other

## 2022-01-09 ENCOUNTER — Encounter: Payer: Self-pay | Admitting: *Deleted

## 2022-01-09 NOTE — Progress Notes (Signed)
Faxed referral order, demographics and chart information to [facility] for Dr. Rush Farmer for infected sebaceous gland.

## 2022-01-12 ENCOUNTER — Inpatient Hospital Stay: Payer: Medicare Other

## 2022-01-12 VITALS — BP 137/62 | HR 74 | Temp 98.0°F | Resp 18

## 2022-01-12 DIAGNOSIS — C220 Liver cell carcinoma: Secondary | ICD-10-CM | POA: Diagnosis not present

## 2022-01-12 DIAGNOSIS — D500 Iron deficiency anemia secondary to blood loss (chronic): Secondary | ICD-10-CM

## 2022-01-12 MED ORDER — SODIUM CHLORIDE 0.9 % IV SOLN: Freq: Once | INTRAVENOUS | Status: AC

## 2022-01-12 MED ORDER — SODIUM CHLORIDE 0.9 % IV SOLN
510.0000 mg | Freq: Once | INTRAVENOUS | Status: AC
  Administered 2022-01-12: 510 mg via INTRAVENOUS
  Filled 2022-01-12: qty 17

## 2022-01-12 NOTE — Patient Instructions (Signed)

## 2022-01-18 ENCOUNTER — Inpatient Hospital Stay: Payer: Medicare Other | Attending: Oncology

## 2022-01-18 ENCOUNTER — Inpatient Hospital Stay: Payer: Medicare Other

## 2022-01-18 VITALS — BP 105/49 | HR 73 | Temp 96.7°F | Resp 16 | Wt 176.6 lb

## 2022-01-18 DIAGNOSIS — Z79899 Other long term (current) drug therapy: Secondary | ICD-10-CM | POA: Insufficient documentation

## 2022-01-18 DIAGNOSIS — K7460 Unspecified cirrhosis of liver: Secondary | ICD-10-CM | POA: Diagnosis present

## 2022-01-18 DIAGNOSIS — D500 Iron deficiency anemia secondary to blood loss (chronic): Secondary | ICD-10-CM | POA: Insufficient documentation

## 2022-01-18 DIAGNOSIS — C220 Liver cell carcinoma: Secondary | ICD-10-CM | POA: Diagnosis present

## 2022-01-18 DIAGNOSIS — D61818 Other pancytopenia: Secondary | ICD-10-CM | POA: Insufficient documentation

## 2022-01-18 DIAGNOSIS — K921 Melena: Secondary | ICD-10-CM | POA: Insufficient documentation

## 2022-01-18 LAB — CBC WITH DIFFERENTIAL (CANCER CENTER ONLY)
Abs Immature Granulocytes: 0.01 10*3/uL (ref 0.00–0.07)
Basophils Absolute: 0 10*3/uL (ref 0.0–0.1)
Basophils Relative: 1 %
Eosinophils Absolute: 0.1 10*3/uL (ref 0.0–0.5)
Eosinophils Relative: 2 %
HCT: 28.1 % — ABNORMAL LOW (ref 39.0–52.0)
Hemoglobin: 8.6 g/dL — ABNORMAL LOW (ref 13.0–17.0)
Immature Granulocytes: 0 %
Lymphocytes Relative: 15 %
Lymphs Abs: 0.4 10*3/uL — ABNORMAL LOW (ref 0.7–4.0)
MCH: 29.8 pg (ref 26.0–34.0)
MCHC: 30.6 g/dL (ref 30.0–36.0)
MCV: 97.2 fL (ref 80.0–100.0)
Monocytes Absolute: 0.3 10*3/uL (ref 0.1–1.0)
Monocytes Relative: 12 %
Neutro Abs: 2 10*3/uL (ref 1.7–7.7)
Neutrophils Relative %: 70 %
Platelet Count: 65 10*3/uL — ABNORMAL LOW (ref 150–400)
RBC: 2.89 MIL/uL — ABNORMAL LOW (ref 4.22–5.81)
RDW: 16.6 % — ABNORMAL HIGH (ref 11.5–15.5)
WBC Count: 2.9 10*3/uL — ABNORMAL LOW (ref 4.0–10.5)
nRBC: 0 % (ref 0.0–0.2)

## 2022-01-18 LAB — SAMPLE TO BLOOD BANK

## 2022-01-18 MED ORDER — SODIUM CHLORIDE 0.9 % IV SOLN
510.0000 mg | Freq: Once | INTRAVENOUS | Status: AC
  Administered 2022-01-18: 510 mg via INTRAVENOUS
  Filled 2022-01-18: qty 17

## 2022-01-18 MED ORDER — SODIUM CHLORIDE 0.9 % IV SOLN: Freq: Once | INTRAVENOUS | Status: AC

## 2022-01-18 NOTE — Patient Instructions (Addendum)
Please make appointment with Dr. Rush Farmer at [facility] [phone]) for infected sebaceous gland as referred at last visit with Dr Benay Spice on 01/05/2022 Ferumoxytol Injection What is this medication? FERUMOXYTOL (FER ue MOX i tol) treats low levels of iron in your body (iron deficiency anemia). Iron is a mineral that plays an important role in making red blood cells, which carry oxygen from your lungs to the rest of your body. This medicine may be used for other purposes; ask your health care provider or pharmacist if you have questions. COMMON BRAND NAME(S): Feraheme What should I tell my care team before I take this medication? They need to know if you have any of these conditions: Anemia not caused by low iron levels High levels of iron in the blood Magnetic resonance imaging (MRI) test scheduled An unusual or allergic reaction to iron, other medications, foods, dyes, or preservatives Pregnant or trying to get pregnant Breast-feeding How should I use this medication? This medication is for injection into a vein. It is given in a hospital or clinic setting. Talk to your care team the use of this medication in children. Special care may be needed. Overdosage: If you think you have taken too much of this medicine contact a poison control center or emergency room at once. NOTE: This medicine is only for you. Do not share this medicine with others. What if I miss a dose? It is important not to miss your dose. Call your care team if you are unable to keep an appointment. What may interact with this medication? Other iron products This list may not describe all possible interactions. Give your health care provider a list of all the medicines, herbs, non-prescription drugs, or dietary supplements you use. Also tell them if you smoke, drink alcohol, or use illegal drugs. Some items may interact with your medicine. What should I watch for while using this medication? Visit your  care team regularly. Tell your care team if your symptoms do not start to get better or if they get worse. You may need blood work done while you are taking this medication. You may need to follow a special diet. Talk to your care team. Foods that contain iron include: whole grains/cereals, dried fruits, beans, or peas, leafy green vegetables, and organ meats (liver, kidney). What side effects may I notice from receiving this medication? Side effects that you should report to your care team as soon as possible: Allergic reactions--skin rash, itching, hives, swelling of the face, lips, tongue, or throat Low blood pressure--dizziness, feeling faint or lightheaded, blurry vision Shortness of breath Side effects that usually do not require medical attention (report to your care team if they continue or are bothersome): Flushing Headache Joint pain Muscle pain Nausea Pain, redness, or irritation at injection site This list may not describe all possible side effects. Call your doctor for medical advice about side effects. You may report side effects to FDA at 1-800-FDA-1088. Where should I keep my medication? This medication is given in a hospital or clinic and will not be stored at home. NOTE: This sheet is a summary. It may not cover all possible information. If you have questions about this medicine, talk to your doctor, pharmacist, or health care provider.  2022 Elsevier/Gold Standard (2020-12-30 00:00:00)

## 2022-01-18 NOTE — Progress Notes (Signed)
Information written on SNF return form to make appointment for surgeon regarding cyst on back.

## 2022-01-25 ENCOUNTER — Telehealth: Payer: Self-pay | Admitting: *Deleted

## 2022-01-25 NOTE — Telephone Encounter (Signed)
Left VM for patient to inquire if he has appointment yet with [facility]. [facility] and confirmed he is still a resident there. Called [facility] referral line and spoke with Mya and provided her phone # for SNF to call for the appointment. Also informed her that his niece is helpful.

## 2022-02-13 ENCOUNTER — Other Ambulatory Visit: Payer: Self-pay | Admitting: Surgery

## 2022-03-06 ENCOUNTER — Encounter: Payer: Self-pay | Admitting: Nurse Practitioner

## 2022-03-06 ENCOUNTER — Inpatient Hospital Stay: Payer: Medicare Other | Attending: Oncology | Admitting: Nurse Practitioner

## 2022-03-06 ENCOUNTER — Inpatient Hospital Stay: Payer: Medicare Other

## 2022-03-06 VITALS — BP 142/85 | HR 73 | Temp 98.1°F | Resp 20 | Ht 69.0 in | Wt 171.4 lb

## 2022-03-06 DIAGNOSIS — D500 Iron deficiency anemia secondary to blood loss (chronic): Secondary | ICD-10-CM

## 2022-03-06 DIAGNOSIS — Z79899 Other long term (current) drug therapy: Secondary | ICD-10-CM | POA: Diagnosis not present

## 2022-03-06 DIAGNOSIS — R161 Splenomegaly, not elsewhere classified: Secondary | ICD-10-CM | POA: Diagnosis not present

## 2022-03-06 DIAGNOSIS — K7460 Unspecified cirrhosis of liver: Secondary | ICD-10-CM | POA: Insufficient documentation

## 2022-03-06 DIAGNOSIS — K644 Residual hemorrhoidal skin tags: Secondary | ICD-10-CM | POA: Diagnosis not present

## 2022-03-06 DIAGNOSIS — D1779 Benign lipomatous neoplasm of other sites: Secondary | ICD-10-CM | POA: Insufficient documentation

## 2022-03-06 DIAGNOSIS — K5730 Diverticulosis of large intestine without perforation or abscess without bleeding: Secondary | ICD-10-CM | POA: Insufficient documentation

## 2022-03-06 DIAGNOSIS — I81 Portal vein thrombosis: Secondary | ICD-10-CM | POA: Insufficient documentation

## 2022-03-06 DIAGNOSIS — K648 Other hemorrhoids: Secondary | ICD-10-CM | POA: Insufficient documentation

## 2022-03-06 DIAGNOSIS — I8510 Secondary esophageal varices without bleeding: Secondary | ICD-10-CM | POA: Insufficient documentation

## 2022-03-06 DIAGNOSIS — D61818 Other pancytopenia: Secondary | ICD-10-CM | POA: Insufficient documentation

## 2022-03-06 DIAGNOSIS — C220 Liver cell carcinoma: Secondary | ICD-10-CM | POA: Insufficient documentation

## 2022-03-06 DIAGNOSIS — D649 Anemia, unspecified: Secondary | ICD-10-CM | POA: Diagnosis not present

## 2022-03-06 DIAGNOSIS — E119 Type 2 diabetes mellitus without complications: Secondary | ICD-10-CM | POA: Diagnosis not present

## 2022-03-06 DIAGNOSIS — K621 Rectal polyp: Secondary | ICD-10-CM | POA: Diagnosis not present

## 2022-03-06 DIAGNOSIS — K5520 Angiodysplasia of colon without hemorrhage: Secondary | ICD-10-CM | POA: Insufficient documentation

## 2022-03-06 LAB — CBC WITH DIFFERENTIAL (CANCER CENTER ONLY)
Abs Immature Granulocytes: 0 10*3/uL (ref 0.00–0.07)
Basophils Absolute: 0 10*3/uL (ref 0.0–0.1)
Basophils Relative: 1 %
Eosinophils Absolute: 0.1 10*3/uL (ref 0.0–0.5)
Eosinophils Relative: 4 %
HCT: 31 % — ABNORMAL LOW (ref 39.0–52.0)
Hemoglobin: 10 g/dL — ABNORMAL LOW (ref 13.0–17.0)
Immature Granulocytes: 0 %
Lymphocytes Relative: 23 %
Lymphs Abs: 0.6 10*3/uL — ABNORMAL LOW (ref 0.7–4.0)
MCH: 31.5 pg (ref 26.0–34.0)
MCHC: 32.3 g/dL (ref 30.0–36.0)
MCV: 97.8 fL (ref 80.0–100.0)
Monocytes Absolute: 0.3 10*3/uL (ref 0.1–1.0)
Monocytes Relative: 12 %
Neutro Abs: 1.5 10*3/uL — ABNORMAL LOW (ref 1.7–7.7)
Neutrophils Relative %: 60 %
Platelet Count: 54 10*3/uL — ABNORMAL LOW (ref 150–400)
RBC: 3.17 MIL/uL — ABNORMAL LOW (ref 4.22–5.81)
RDW: 15.1 % (ref 11.5–15.5)
WBC Count: 2.5 10*3/uL — ABNORMAL LOW (ref 4.0–10.5)
nRBC: 0 % (ref 0.0–0.2)

## 2022-03-06 LAB — FERRITIN: Ferritin: 21 ng/mL — ABNORMAL LOW (ref 24–336)

## 2022-03-06 NOTE — Progress Notes (Signed)
[name] OFFICE PROGRESS NOTE   Diagnosis: Hepatocellular carcinoma, cirrhosis, anemia  INTERVAL HISTORY:   [name] returns as scheduled.  He is by himself.  He received Feraheme 01/12/2022 and 01/18/2022.  He reports having a nosebleed about 3 weeks ago.  No other bleeding.  He denies shortness of breath.  Objective:  Vital signs in last 24 hours:  Blood pressure (!) 142/85, pulse 73, temperature 98.1 F (36.7 C), temperature source Oral, resp. rate 20, height '5\' 9"'$  (1.753 m), weight 171 lb 6.4 oz (77.7 kg), SpO2 100 %.    Resp: Lungs clear bilaterally. Cardio: Regular rate and rhythm. GI: No hepatosplenomegaly. Vascular: No leg edema. Neuro: Alert, not oriented.  Follows some commands.    Lab Results:  Lab Results  Component Value Date   WBC 2.5 (L) 03/06/2022   HGB 10.0 (L) 03/06/2022   HCT 31.0 (L) 03/06/2022   MCV 97.8 03/06/2022   PLT 54 (L) 03/06/2022   NEUTROABS 1.5 (L) 03/06/2022    Imaging:  No results found.  Medications: I have reviewed the patient's current medications.  Assessment/Plan: Hepatocellular carcinoma, stage I (T1 NX) status post partial left hepatectomy 03/02/2013. 08/31/2013 AFP 2.4.   CT abdomen 08/31/2013 with postoperative changes of partial hepatectomy with small postoperative fluid collection along the resection margin. No definite signs to suggest residual or locally recurrent disease. Several borderline enlarged and minimally enlarged lymph nodes superior to the liver in the juxtapericardiac fat of the lower anterior mediastinum with the largest measuring 11 mm slightly increased compared to the prior study 01/15/2013. Several prominent non-pathologically enlarged upper abdominal ligament lymph nodes similar to the prior study. Small esophageal varices.   CT abdomen 03/03/2012 without evidence of recurrent hepatocellular carcinoma, stable right lung base nodule CT the abdomen and pelvis 09/06/2014 without  evidence of recurrent hepatocellular carcinoma, slight enlargement of pre-cardiac lymph nodes CT abdomen/pelvis 09/07/2015 with no findings for recurrent tumor or metastatic disease. Stable small 3 mm right middle lobe pulmonary nodule since 2014. Epicardial lymph node measured 11 mm, previously 13 mm. Stable cirrhotic changes. Fairly significant esophageal varices. 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 Hepatitis B core antibody positive. Diabetes. Severe microcytic anemia-likely iron deficiency anemia-progressive 06/03/2017. Stool Hemoccult positive 3 06/05/2017. Colonoscopy 12/27/2016-external and internal hemorrhoids. Diverticulosis in the sigmoid, descending and transverse colon. One medium polyp in the distal descending colon. 3 small polyps in the transverse colon. 2 diminutive polyps in the rectum and the proximal ascending colon. Medium-sized lipoma transverse colon. Small lipoma mid ascending colon. Multiple nonbleeding colonic angiodysplastic lesions. Pathology-tubular adenomas, hyperplastic polyps. Pancytopenia secondary to cirrhosis and GI bleeding Laparoscopic cholecystectomy 01/28/2018 Decreased iron stores 10/15/2018; transfused 2 units of blood 10/18/2018 and 06/01/2020; Feraheme 11/04/2018 and 11/11/2018; Feraheme 02/18/2019 and 02/25/2019, 02/15/2020, 02/29/2020, 06/10/2020, 06/17/2020, 09/21/2020, 09/28/2020, 01/02/2021, 10/11/2021, 10/18/2021, 01/12/2022 and 01/18/2022     Disposition: [name] appears unchanged.  He received IV iron about 6 weeks ago.  Hemoglobin is improved at 10.  We will follow-up on the ferritin.  He will return for a CBC and ferritin level in 4 weeks.  Continue IV iron as needed.  We will see him in  follow-up in 2 months.    Ned Card ANP/GNP-BC   03/06/2022  11:14 AM

## 2022-03-07 ENCOUNTER — Other Ambulatory Visit: Payer: Self-pay | Admitting: *Deleted

## 2022-03-07 DIAGNOSIS — D500 Iron deficiency anemia secondary to blood loss (chronic): Secondary | ICD-10-CM

## 2022-04-02 ENCOUNTER — Inpatient Hospital Stay: Payer: Medicare Other

## 2022-04-03 ENCOUNTER — Inpatient Hospital Stay: Payer: Medicare Other

## 2022-04-09 ENCOUNTER — Inpatient Hospital Stay: Payer: Medicare Other | Attending: Oncology

## 2022-04-09 DIAGNOSIS — Z79899 Other long term (current) drug therapy: Secondary | ICD-10-CM | POA: Diagnosis not present

## 2022-04-09 DIAGNOSIS — D500 Iron deficiency anemia secondary to blood loss (chronic): Secondary | ICD-10-CM

## 2022-04-09 DIAGNOSIS — C220 Liver cell carcinoma: Secondary | ICD-10-CM | POA: Diagnosis present

## 2022-04-09 DIAGNOSIS — D509 Iron deficiency anemia, unspecified: Secondary | ICD-10-CM | POA: Diagnosis not present

## 2022-04-09 DIAGNOSIS — D61818 Other pancytopenia: Secondary | ICD-10-CM | POA: Insufficient documentation

## 2022-04-09 DIAGNOSIS — K7460 Unspecified cirrhosis of liver: Secondary | ICD-10-CM | POA: Insufficient documentation

## 2022-04-09 LAB — CBC WITH DIFFERENTIAL (CANCER CENTER ONLY)
Abs Immature Granulocytes: 0.01 10*3/uL (ref 0.00–0.07)
Basophils Absolute: 0 10*3/uL (ref 0.0–0.1)
Basophils Relative: 0 %
Eosinophils Absolute: 0.1 10*3/uL (ref 0.0–0.5)
Eosinophils Relative: 3 %
HCT: 28.5 % — ABNORMAL LOW (ref 39.0–52.0)
Hemoglobin: 9.2 g/dL — ABNORMAL LOW (ref 13.0–17.0)
Immature Granulocytes: 0 %
Lymphocytes Relative: 21 %
Lymphs Abs: 0.6 10*3/uL — ABNORMAL LOW (ref 0.7–4.0)
MCH: 31 pg (ref 26.0–34.0)
MCHC: 32.3 g/dL (ref 30.0–36.0)
MCV: 96 fL (ref 80.0–100.0)
Monocytes Absolute: 0.3 10*3/uL (ref 0.1–1.0)
Monocytes Relative: 11 %
Neutro Abs: 1.7 10*3/uL (ref 1.7–7.7)
Neutrophils Relative %: 65 %
Platelet Count: 53 10*3/uL — ABNORMAL LOW (ref 150–400)
RBC: 2.97 MIL/uL — ABNORMAL LOW (ref 4.22–5.81)
RDW: 14.8 % (ref 11.5–15.5)
WBC Count: 2.7 10*3/uL — ABNORMAL LOW (ref 4.0–10.5)
nRBC: 0 % (ref 0.0–0.2)

## 2022-04-09 LAB — SAMPLE TO BLOOD BANK

## 2022-04-09 LAB — FERRITIN: Ferritin: 29 ng/mL (ref 24–336)

## 2022-05-10 ENCOUNTER — Inpatient Hospital Stay: Payer: Medicare Other | Attending: Oncology | Admitting: Oncology

## 2022-05-10 ENCOUNTER — Inpatient Hospital Stay: Payer: Medicare Other

## 2022-05-10 VITALS — BP 137/76 | HR 78 | Temp 98.2°F | Resp 18 | Ht 69.0 in | Wt 176.0 lb

## 2022-05-10 DIAGNOSIS — C220 Liver cell carcinoma: Secondary | ICD-10-CM | POA: Insufficient documentation

## 2022-05-10 DIAGNOSIS — D500 Iron deficiency anemia secondary to blood loss (chronic): Secondary | ICD-10-CM

## 2022-05-10 DIAGNOSIS — D61818 Other pancytopenia: Secondary | ICD-10-CM | POA: Diagnosis not present

## 2022-05-10 DIAGNOSIS — E119 Type 2 diabetes mellitus without complications: Secondary | ICD-10-CM | POA: Insufficient documentation

## 2022-05-10 LAB — CBC WITH DIFFERENTIAL (CANCER CENTER ONLY)
Abs Immature Granulocytes: 0 10*3/uL (ref 0.00–0.07)
Basophils Absolute: 0 10*3/uL (ref 0.0–0.1)
Basophils Relative: 1 %
Eosinophils Absolute: 0.1 10*3/uL (ref 0.0–0.5)
Eosinophils Relative: 3 %
HCT: 28.5 % — ABNORMAL LOW (ref 39.0–52.0)
Hemoglobin: 9.1 g/dL — ABNORMAL LOW (ref 13.0–17.0)
Immature Granulocytes: 0 %
Lymphocytes Relative: 24 %
Lymphs Abs: 0.6 10*3/uL — ABNORMAL LOW (ref 0.7–4.0)
MCH: 31.1 pg (ref 26.0–34.0)
MCHC: 31.9 g/dL (ref 30.0–36.0)
MCV: 97.3 fL (ref 80.0–100.0)
Monocytes Absolute: 0.3 10*3/uL (ref 0.1–1.0)
Monocytes Relative: 11 %
Neutro Abs: 1.6 10*3/uL — ABNORMAL LOW (ref 1.7–7.7)
Neutrophils Relative %: 61 %
Platelet Count: 52 10*3/uL — ABNORMAL LOW (ref 150–400)
RBC: 2.93 MIL/uL — ABNORMAL LOW (ref 4.22–5.81)
RDW: 15.6 % — ABNORMAL HIGH (ref 11.5–15.5)
WBC Count: 2.5 10*3/uL — ABNORMAL LOW (ref 4.0–10.5)
nRBC: 0 % (ref 0.0–0.2)

## 2022-05-10 LAB — FERRITIN: Ferritin: 22 ng/mL — ABNORMAL LOW (ref 24–336)

## 2022-05-10 NOTE — Addendum Note (Signed)
Addended by: Tania Ade on: 05/10/2022 01:18 PM   Modules accepted: Orders

## 2022-05-10 NOTE — Progress Notes (Signed)
[facility] OFFICE PROGRESS NOTE   Diagnosis: Cirrhosis, anemia, dementia  INTERVAL HISTORY:   [name] returns as scheduled.  He continues to reside in an assisted living facility.  He reports falling 4 to 5 weeks ago.  He has developed pain at the left posterior lateral head since the fall.  He complains of a lack of food at the facility.  He last received iron in June.   Objective:  Vital signs in last 24 hours:  Blood pressure 137/76, pulse 78, temperature 98.2 F (36.8 C), temperature source Oral, resp. rate 18, height '5\' 9"'$  (1.753 m), weight 176 lb (79.8 kg), SpO2 100 %.    Resp: Clear bilaterally Cardio: Regular rate and rhythm GI: Mildly distended, no hepatosplenomegaly, no apparent ascites Vascular: No leg edema Musculoskeletal: No tenderness at the posterior left scalp/skull, no apparent mass or hematoma   Lab Results:  Lab Results  Component Value Date   WBC 2.5 (L) 05/10/2022   HGB 9.1 (L) 05/10/2022   HCT 28.5 (L) 05/10/2022   MCV 97.3 05/10/2022   PLT 52 (L) 05/10/2022   NEUTROABS 1.6 (L) 05/10/2022    CMP  Lab Results  Component Value Date   NA 138 10/11/2021   K 4.5 10/11/2021   CL 109 10/11/2021   CO2 23 10/11/2021   GLUCOSE 208 (H) 10/11/2021   BUN 16 10/11/2021   CREATININE 1.10 10/11/2021   CALCIUM 8.7 (L) 10/11/2021   PROT 6.1 (L) 10/11/2021   ALBUMIN 2.9 (L) 10/11/2021   AST 27 10/11/2021   ALT 22 10/11/2021   ALKPHOS 101 10/11/2021   BILITOT 1.1 10/11/2021   GFRNONAA >60 10/11/2021   GFRAA 75 09/01/2020    Lab Results  Component Value Date   CEA 2.7 01/16/2013    Medications: I have reviewed the patient's current medications.   Assessment/Plan: Hepatocellular carcinoma, stage I (T1 NX) status post partial left hepatectomy 03/02/2013. 08/31/2013 AFP 2.4.   CT abdomen 08/31/2013 with postoperative changes of partial hepatectomy with small postoperative fluid collection along the resection margin. No  definite signs to suggest residual or locally recurrent disease. Several borderline enlarged and minimally enlarged lymph nodes superior to the liver in the juxtapericardiac fat of the lower anterior mediastinum with the largest measuring 11 mm slightly increased compared to the prior study 01/15/2013. Several prominent non-pathologically enlarged upper abdominal ligament lymph nodes similar to the prior study. Small esophageal varices.   CT abdomen 03/03/2012 without evidence of recurrent hepatocellular carcinoma, stable right lung base nodule CT the abdomen and pelvis 09/06/2014 without evidence of recurrent hepatocellular carcinoma, slight enlargement of pre-cardiac lymph nodes CT abdomen/pelvis 09/07/2015 with no findings for recurrent tumor or metastatic disease. Stable small 3 mm right middle lobe pulmonary nodule since 2014. Epicardial lymph node measured 11 mm, previously 13 mm. Stable cirrhotic changes. Fairly significant esophageal varices. 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 Hepatitis B core antibody positive. Diabetes. Severe microcytic anemia-likely iron deficiency anemia-progressive 06/03/2017. Stool Hemoccult positive 3 06/05/2017. Colonoscopy 12/27/2016-external and internal hemorrhoids. Diverticulosis in the sigmoid, descending and transverse colon. One medium polyp in the distal descending colon. 3 small polyps in the transverse  colon. 2 diminutive polyps in  the rectum and the proximal ascending colon. Medium-sized lipoma transverse colon. Small lipoma mid ascending colon. Multiple nonbleeding colonic angiodysplastic lesions. Pathology-tubular adenomas, hyperplastic polyps. Pancytopenia secondary to cirrhosis and GI bleeding Laparoscopic cholecystectomy 01/28/2018 Decreased iron stores 10/15/2018; transfused 2 units of blood 10/18/2018 and 06/01/2020; Feraheme 11/04/2018 and 11/11/2018; Feraheme 02/18/2019 and 02/25/2019, 02/15/2020, 02/29/2020, 06/10/2020, 06/17/2020, 09/21/2020, 09/28/2020, 01/02/2021, 10/11/2021, 10/18/2021, 01/12/2022 and 01/18/2022 Dementia      Disposition: [name] appears unchanged.  He has stable pancytopenia.  He will not require Red cell transfusion with a hemoglobin at the current level.  We will follow-up on the ferritin level from today.  He last received IV iron 01/18/2022.  He will return for a lab visit in 1 month and an office visit in 2 months.  We will ask the living facility provider to assess the head pain.  Betsy Coder, MD  05/10/2022  11:45 AM

## 2022-06-13 ENCOUNTER — Other Ambulatory Visit: Payer: Self-pay | Admitting: Nurse Practitioner

## 2022-06-14 ENCOUNTER — Ambulatory Visit: Payer: Medicare Other

## 2022-06-14 ENCOUNTER — Other Ambulatory Visit: Payer: Medicare Other

## 2022-06-15 ENCOUNTER — Inpatient Hospital Stay: Payer: Medicare Other

## 2022-06-15 ENCOUNTER — Inpatient Hospital Stay: Payer: Medicare Other | Attending: Oncology

## 2022-06-15 VITALS — BP 126/64 | HR 75 | Temp 97.7°F | Resp 17 | Ht 69.0 in | Wt 177.0 lb

## 2022-06-15 DIAGNOSIS — D500 Iron deficiency anemia secondary to blood loss (chronic): Secondary | ICD-10-CM

## 2022-06-15 DIAGNOSIS — E119 Type 2 diabetes mellitus without complications: Secondary | ICD-10-CM | POA: Diagnosis not present

## 2022-06-15 DIAGNOSIS — K922 Gastrointestinal hemorrhage, unspecified: Secondary | ICD-10-CM | POA: Insufficient documentation

## 2022-06-15 DIAGNOSIS — D61818 Other pancytopenia: Secondary | ICD-10-CM | POA: Diagnosis not present

## 2022-06-15 DIAGNOSIS — K7460 Unspecified cirrhosis of liver: Secondary | ICD-10-CM | POA: Diagnosis not present

## 2022-06-15 DIAGNOSIS — Z8505 Personal history of malignant neoplasm of liver: Secondary | ICD-10-CM | POA: Insufficient documentation

## 2022-06-15 DIAGNOSIS — D649 Anemia, unspecified: Secondary | ICD-10-CM | POA: Insufficient documentation

## 2022-06-15 DIAGNOSIS — Z79899 Other long term (current) drug therapy: Secondary | ICD-10-CM | POA: Diagnosis not present

## 2022-06-15 LAB — SAMPLE TO BLOOD BANK

## 2022-06-15 LAB — CBC WITH DIFFERENTIAL (CANCER CENTER ONLY)
Abs Immature Granulocytes: 0.01 10*3/uL (ref 0.00–0.07)
Basophils Absolute: 0 10*3/uL (ref 0.0–0.1)
Basophils Relative: 1 %
Eosinophils Absolute: 0.1 10*3/uL (ref 0.0–0.5)
Eosinophils Relative: 2 %
HCT: 32.1 % — ABNORMAL LOW (ref 39.0–52.0)
Hemoglobin: 10.3 g/dL — ABNORMAL LOW (ref 13.0–17.0)
Immature Granulocytes: 0 %
Lymphocytes Relative: 19 %
Lymphs Abs: 0.5 10*3/uL — ABNORMAL LOW (ref 0.7–4.0)
MCH: 32.1 pg (ref 26.0–34.0)
MCHC: 32.1 g/dL (ref 30.0–36.0)
MCV: 100 fL (ref 80.0–100.0)
Monocytes Absolute: 0.3 10*3/uL (ref 0.1–1.0)
Monocytes Relative: 11 %
Neutro Abs: 1.8 10*3/uL (ref 1.7–7.7)
Neutrophils Relative %: 67 %
Platelet Count: 52 10*3/uL — ABNORMAL LOW (ref 150–400)
RBC: 3.21 MIL/uL — ABNORMAL LOW (ref 4.22–5.81)
RDW: 14.8 % (ref 11.5–15.5)
WBC Count: 2.7 10*3/uL — ABNORMAL LOW (ref 4.0–10.5)
nRBC: 0 % (ref 0.0–0.2)

## 2022-06-15 LAB — FERRITIN: Ferritin: 36 ng/mL (ref 24–336)

## 2022-06-15 MED ORDER — SODIUM CHLORIDE 0.9 % IV SOLN
510.0000 mg | Freq: Once | INTRAVENOUS | Status: AC
  Administered 2022-06-15: 510 mg via INTRAVENOUS
  Filled 2022-06-15: qty 510

## 2022-06-15 MED ORDER — SODIUM CHLORIDE 0.9 % IV SOLN: Freq: Once | INTRAVENOUS | Status: AC

## 2022-06-15 NOTE — Patient Instructions (Signed)

## 2022-06-15 NOTE — Progress Notes (Signed)
Terri from [facility] for visit

## 2022-06-22 ENCOUNTER — Inpatient Hospital Stay: Payer: Medicare Other | Attending: Oncology

## 2022-06-22 VITALS — BP 143/60 | HR 68 | Temp 98.5°F | Resp 18 | Ht 69.0 in | Wt 181.0 lb

## 2022-06-22 DIAGNOSIS — D500 Iron deficiency anemia secondary to blood loss (chronic): Secondary | ICD-10-CM

## 2022-06-22 DIAGNOSIS — D509 Iron deficiency anemia, unspecified: Secondary | ICD-10-CM | POA: Insufficient documentation

## 2022-06-22 DIAGNOSIS — Z8505 Personal history of malignant neoplasm of liver: Secondary | ICD-10-CM | POA: Diagnosis not present

## 2022-06-22 DIAGNOSIS — Z79899 Other long term (current) drug therapy: Secondary | ICD-10-CM | POA: Diagnosis not present

## 2022-06-22 MED ORDER — SODIUM CHLORIDE 0.9 % IV SOLN
510.0000 mg | Freq: Once | INTRAVENOUS | Status: AC
  Administered 2022-06-22: 510 mg via INTRAVENOUS
  Filled 2022-06-22: qty 17

## 2022-06-22 MED ORDER — SODIUM CHLORIDE 0.9 % IV SOLN: Freq: Once | INTRAVENOUS | Status: AC

## 2022-06-22 NOTE — Patient Instructions (Signed)

## 2022-07-11 ENCOUNTER — Inpatient Hospital Stay: Payer: Medicare Other | Admitting: Nurse Practitioner

## 2022-07-11 ENCOUNTER — Inpatient Hospital Stay: Payer: Medicare Other

## 2022-07-20 ENCOUNTER — Encounter: Payer: Self-pay | Admitting: Nurse Practitioner

## 2022-07-20 ENCOUNTER — Inpatient Hospital Stay: Payer: Medicare Other | Attending: Oncology

## 2022-07-20 ENCOUNTER — Inpatient Hospital Stay (HOSPITAL_BASED_OUTPATIENT_CLINIC_OR_DEPARTMENT_OTHER): Payer: Medicare Other | Admitting: Nurse Practitioner

## 2022-07-20 VITALS — BP 138/63 | HR 78 | Temp 98.1°F | Resp 18 | Ht 69.0 in | Wt 179.0 lb

## 2022-07-20 DIAGNOSIS — D61818 Other pancytopenia: Secondary | ICD-10-CM | POA: Insufficient documentation

## 2022-07-20 DIAGNOSIS — I8510 Secondary esophageal varices without bleeding: Secondary | ICD-10-CM | POA: Diagnosis not present

## 2022-07-20 DIAGNOSIS — K7460 Unspecified cirrhosis of liver: Secondary | ICD-10-CM | POA: Insufficient documentation

## 2022-07-20 DIAGNOSIS — Z79899 Other long term (current) drug therapy: Secondary | ICD-10-CM | POA: Diagnosis not present

## 2022-07-20 DIAGNOSIS — K5520 Angiodysplasia of colon without hemorrhage: Secondary | ICD-10-CM | POA: Insufficient documentation

## 2022-07-20 DIAGNOSIS — K621 Rectal polyp: Secondary | ICD-10-CM | POA: Insufficient documentation

## 2022-07-20 DIAGNOSIS — D500 Iron deficiency anemia secondary to blood loss (chronic): Secondary | ICD-10-CM

## 2022-07-20 DIAGNOSIS — D1779 Benign lipomatous neoplasm of other sites: Secondary | ICD-10-CM | POA: Insufficient documentation

## 2022-07-20 DIAGNOSIS — K644 Residual hemorrhoidal skin tags: Secondary | ICD-10-CM | POA: Insufficient documentation

## 2022-07-20 DIAGNOSIS — Z8505 Personal history of malignant neoplasm of liver: Secondary | ICD-10-CM | POA: Diagnosis present

## 2022-07-20 DIAGNOSIS — K648 Other hemorrhoids: Secondary | ICD-10-CM | POA: Diagnosis not present

## 2022-07-20 DIAGNOSIS — F0390 Unspecified dementia without behavioral disturbance: Secondary | ICD-10-CM | POA: Diagnosis not present

## 2022-07-20 DIAGNOSIS — E119 Type 2 diabetes mellitus without complications: Secondary | ICD-10-CM | POA: Insufficient documentation

## 2022-07-20 DIAGNOSIS — D649 Anemia, unspecified: Secondary | ICD-10-CM | POA: Diagnosis not present

## 2022-07-20 LAB — CBC WITH DIFFERENTIAL (CANCER CENTER ONLY)
Abs Immature Granulocytes: 0.01 10*3/uL (ref 0.00–0.07)
Basophils Absolute: 0 10*3/uL (ref 0.0–0.1)
Basophils Relative: 1 %
Eosinophils Absolute: 0.1 10*3/uL (ref 0.0–0.5)
Eosinophils Relative: 2 %
HCT: 32.2 % — ABNORMAL LOW (ref 39.0–52.0)
Hemoglobin: 10.7 g/dL — ABNORMAL LOW (ref 13.0–17.0)
Immature Granulocytes: 0 %
Lymphocytes Relative: 15 %
Lymphs Abs: 0.4 10*3/uL — ABNORMAL LOW (ref 0.7–4.0)
MCH: 33.8 pg (ref 26.0–34.0)
MCHC: 33.2 g/dL (ref 30.0–36.0)
MCV: 101.6 fL — ABNORMAL HIGH (ref 80.0–100.0)
Monocytes Absolute: 0.2 10*3/uL (ref 0.1–1.0)
Monocytes Relative: 7 %
Neutro Abs: 2.3 10*3/uL (ref 1.7–7.7)
Neutrophils Relative %: 75 %
Platelet Count: 59 10*3/uL — ABNORMAL LOW (ref 150–400)
RBC: 3.17 MIL/uL — ABNORMAL LOW (ref 4.22–5.81)
RDW: 14.7 % (ref 11.5–15.5)
WBC Count: 3 10*3/uL — ABNORMAL LOW (ref 4.0–10.5)
nRBC: 0 % (ref 0.0–0.2)

## 2022-07-20 LAB — FERRITIN: Ferritin: 107 ng/mL (ref 24–336)

## 2022-07-20 NOTE — Progress Notes (Signed)
[facility] OFFICE PROGRESS NOTE   Diagnosis: Cirrhosis, anemia, dementia  INTERVAL HISTORY:   [name] returns as scheduled.  He resides in an assisted living facility.  He received Feraheme 510 mg IV 06/15/2022 and 06/22/2022.  [name] is unable to provide an accurate review of symptoms due to his mental status and hearing deficit.  He is accompanied by a caregiver from the facility.  She reports overall he is doing well.  He has a good appetite.  Objective:  Vital signs in last 24 hours:  Blood pressure 138/63, pulse 78, temperature 98.1 F (36.7 C), temperature source Oral, resp. rate 18, height '5\' 9"'$  (1.753 m), weight 179 lb (81.2 kg), SpO2 100 %.    Resp: Lungs clear bilaterally. Cardio: Regular rate and rhythm. GI: No hepatosplenomegaly. Vascular: No leg edema. Neuro: Alert.  Follows some commands.   Lab Results:  Lab Results  Component Value Date   WBC 3.0 (L) 07/20/2022   HGB 10.7 (L) 07/20/2022   HCT 32.2 (L) 07/20/2022   MCV 101.6 (H) 07/20/2022   PLT 59 (L) 07/20/2022   NEUTROABS 2.3 07/20/2022    Imaging:  No results found.  Medications: I have reviewed the patient's current medications.  Assessment/Plan: Hepatocellular carcinoma, stage I (T1 NX) status post partial left hepatectomy 03/02/2013. 08/31/2013 AFP 2.4.   CT abdomen 08/31/2013 with postoperative changes of partial hepatectomy with small postoperative fluid collection along the resection margin. No definite signs to suggest residual or locally recurrent disease. Several borderline enlarged and minimally enlarged lymph nodes superior to the liver in the juxtapericardiac fat of the lower anterior mediastinum with the largest measuring 11 mm slightly increased compared to the prior study 01/15/2013. Several prominent non-pathologically enlarged upper abdominal ligament lymph nodes similar to the prior study. Small esophageal varices.   CT abdomen 03/03/2012 without  evidence of recurrent hepatocellular carcinoma, stable right lung base nodule CT the abdomen and pelvis 09/06/2014 without evidence of recurrent hepatocellular carcinoma, slight enlargement of pre-cardiac lymph nodes CT abdomen/pelvis 09/07/2015 with no findings for recurrent tumor or metastatic disease. Stable small 3 mm right middle lobe pulmonary nodule since 2014. Epicardial lymph node measured 11 mm, previously 13 mm. Stable cirrhotic changes. Fairly significant esophageal varices. 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 Hepatitis B core antibody positive. Diabetes. Severe microcytic anemia-likely iron deficiency anemia-progressive 06/03/2017. Stool Hemoccult positive 3 06/05/2017. Colonoscopy 12/27/2016-external and internal hemorrhoids. Diverticulosis in the sigmoid, descending and transverse colon. One medium polyp in the distal descending colon. 3 small polyps in the transverse colon. 2 diminutive polyps in the rectum and the proximal ascending colon. Medium-sized lipoma transverse colon. Small lipoma mid ascending colon. Multiple nonbleeding colonic angiodysplastic lesions. Pathology-tubular adenomas, hyperplastic polyps. Pancytopenia secondary to cirrhosis and GI bleeding Laparoscopic cholecystectomy 01/28/2018 Decreased iron stores 10/15/2018; transfused 2 units of blood 10/18/2018 and 06/01/2020; Feraheme 11/04/2018 and 11/11/2018; Feraheme 02/18/2019 and 02/25/2019,  02/15/2020, 02/29/2020, 06/10/2020, 06/17/2020, 09/21/2020, 09/28/2020, 01/02/2021, 10/11/2021, 10/18/2021, 01/12/2022 and 01/18/2022 Dementia      Disposition: [name] appears unchanged.  Review of the CBC from today shows stable pancytopenia.  No red cell transfusion support required at present.  We  will follow-up on the ferritin from today.  He will return for CBC and ferritin in 4 weeks and 8 weeks.  We are scheduling an office visit in 12 weeks.    Ned Card ANP/GNP-BC   07/20/2022  1:32 PM

## 2022-07-21 LAB — SAMPLE TO BLOOD BANK

## 2022-08-17 ENCOUNTER — Inpatient Hospital Stay: Payer: Medicare Other

## 2022-08-17 DIAGNOSIS — D500 Iron deficiency anemia secondary to blood loss (chronic): Secondary | ICD-10-CM

## 2022-08-17 DIAGNOSIS — Z8505 Personal history of malignant neoplasm of liver: Secondary | ICD-10-CM | POA: Diagnosis not present

## 2022-08-17 LAB — CBC WITH DIFFERENTIAL (CANCER CENTER ONLY)
Abs Immature Granulocytes: 0.01 10*3/uL (ref 0.00–0.07)
Basophils Absolute: 0 10*3/uL (ref 0.0–0.1)
Basophils Relative: 1 %
Eosinophils Absolute: 0 10*3/uL (ref 0.0–0.5)
Eosinophils Relative: 2 %
HCT: 34.5 % — ABNORMAL LOW (ref 39.0–52.0)
Hemoglobin: 11.3 g/dL — ABNORMAL LOW (ref 13.0–17.0)
Immature Granulocytes: 0 %
Lymphocytes Relative: 20 %
Lymphs Abs: 0.5 10*3/uL — ABNORMAL LOW (ref 0.7–4.0)
MCH: 33.5 pg (ref 26.0–34.0)
MCHC: 32.8 g/dL (ref 30.0–36.0)
MCV: 102.4 fL — ABNORMAL HIGH (ref 80.0–100.0)
Monocytes Absolute: 0.3 10*3/uL (ref 0.1–1.0)
Monocytes Relative: 10 %
Neutro Abs: 1.8 10*3/uL (ref 1.7–7.7)
Neutrophils Relative %: 67 %
Platelet Count: 54 10*3/uL — ABNORMAL LOW (ref 150–400)
RBC: 3.37 MIL/uL — ABNORMAL LOW (ref 4.22–5.81)
RDW: 13.7 % (ref 11.5–15.5)
WBC Count: 2.7 10*3/uL — ABNORMAL LOW (ref 4.0–10.5)
nRBC: 0 % (ref 0.0–0.2)

## 2022-08-17 LAB — SAMPLE TO BLOOD BANK

## 2022-08-17 LAB — FERRITIN: Ferritin: 84 ng/mL (ref 24–336)

## 2022-09-14 ENCOUNTER — Inpatient Hospital Stay: Payer: Medicare Other | Attending: Oncology

## 2022-09-14 DIAGNOSIS — D500 Iron deficiency anemia secondary to blood loss (chronic): Secondary | ICD-10-CM

## 2022-09-14 DIAGNOSIS — D509 Iron deficiency anemia, unspecified: Secondary | ICD-10-CM | POA: Diagnosis not present

## 2022-09-14 DIAGNOSIS — Z8505 Personal history of malignant neoplasm of liver: Secondary | ICD-10-CM | POA: Diagnosis present

## 2022-09-14 LAB — CBC WITH DIFFERENTIAL (CANCER CENTER ONLY)
Abs Immature Granulocytes: 0.01 10*3/uL (ref 0.00–0.07)
Basophils Absolute: 0 10*3/uL (ref 0.0–0.1)
Basophils Relative: 0 %
Eosinophils Absolute: 0.1 10*3/uL (ref 0.0–0.5)
Eosinophils Relative: 2 %
HCT: 31.9 % — ABNORMAL LOW (ref 39.0–52.0)
Hemoglobin: 10.6 g/dL — ABNORMAL LOW (ref 13.0–17.0)
Immature Granulocytes: 0 %
Lymphocytes Relative: 14 %
Lymphs Abs: 0.5 10*3/uL — ABNORMAL LOW (ref 0.7–4.0)
MCH: 33.7 pg (ref 26.0–34.0)
MCHC: 33.2 g/dL (ref 30.0–36.0)
MCV: 101.3 fL — ABNORMAL HIGH (ref 80.0–100.0)
Monocytes Absolute: 0.3 10*3/uL (ref 0.1–1.0)
Monocytes Relative: 10 %
Neutro Abs: 2.6 10*3/uL (ref 1.7–7.7)
Neutrophils Relative %: 74 %
Platelet Count: 56 10*3/uL — ABNORMAL LOW (ref 150–400)
RBC: 3.15 MIL/uL — ABNORMAL LOW (ref 4.22–5.81)
RDW: 13.9 % (ref 11.5–15.5)
WBC Count: 3.4 10*3/uL — ABNORMAL LOW (ref 4.0–10.5)
nRBC: 0 % (ref 0.0–0.2)

## 2022-09-14 LAB — FERRITIN: Ferritin: 65 ng/mL (ref 24–336)

## 2022-09-15 LAB — SAMPLE TO BLOOD BANK

## 2022-09-20 NOTE — Progress Notes (Signed)
This encounter was created in error - please disregard.

## 2022-10-11 ENCOUNTER — Inpatient Hospital Stay: Payer: Medicare Other

## 2022-10-11 ENCOUNTER — Inpatient Hospital Stay: Payer: Medicare Other | Attending: Oncology | Admitting: Oncology

## 2022-10-11 VITALS — BP 129/66 | HR 88 | Temp 98.1°F | Resp 18 | Wt 175.2 lb

## 2022-10-11 DIAGNOSIS — F0390 Unspecified dementia without behavioral disturbance: Secondary | ICD-10-CM | POA: Diagnosis not present

## 2022-10-11 DIAGNOSIS — K7460 Unspecified cirrhosis of liver: Secondary | ICD-10-CM | POA: Insufficient documentation

## 2022-10-11 DIAGNOSIS — Z8505 Personal history of malignant neoplasm of liver: Secondary | ICD-10-CM | POA: Diagnosis present

## 2022-10-11 DIAGNOSIS — I8510 Secondary esophageal varices without bleeding: Secondary | ICD-10-CM | POA: Insufficient documentation

## 2022-10-11 DIAGNOSIS — D1779 Benign lipomatous neoplasm of other sites: Secondary | ICD-10-CM | POA: Insufficient documentation

## 2022-10-11 DIAGNOSIS — B1910 Unspecified viral hepatitis B without hepatic coma: Secondary | ICD-10-CM | POA: Insufficient documentation

## 2022-10-11 DIAGNOSIS — D500 Iron deficiency anemia secondary to blood loss (chronic): Secondary | ICD-10-CM

## 2022-10-11 DIAGNOSIS — Z79899 Other long term (current) drug therapy: Secondary | ICD-10-CM | POA: Insufficient documentation

## 2022-10-11 DIAGNOSIS — D61818 Other pancytopenia: Secondary | ICD-10-CM | POA: Insufficient documentation

## 2022-10-11 DIAGNOSIS — E119 Type 2 diabetes mellitus without complications: Secondary | ICD-10-CM | POA: Insufficient documentation

## 2022-10-11 LAB — CBC WITH DIFFERENTIAL (CANCER CENTER ONLY)
Abs Immature Granulocytes: 0.01 10*3/uL (ref 0.00–0.07)
Basophils Absolute: 0 10*3/uL (ref 0.0–0.1)
Basophils Relative: 1 %
Eosinophils Absolute: 0 10*3/uL (ref 0.0–0.5)
Eosinophils Relative: 1 %
HCT: 31.9 % — ABNORMAL LOW (ref 39.0–52.0)
Hemoglobin: 10.3 g/dL — ABNORMAL LOW (ref 13.0–17.0)
Immature Granulocytes: 0 %
Lymphocytes Relative: 18 %
Lymphs Abs: 0.5 10*3/uL — ABNORMAL LOW (ref 0.7–4.0)
MCH: 32.8 pg (ref 26.0–34.0)
MCHC: 32.3 g/dL (ref 30.0–36.0)
MCV: 101.6 fL — ABNORMAL HIGH (ref 80.0–100.0)
Monocytes Absolute: 0.3 10*3/uL (ref 0.1–1.0)
Monocytes Relative: 9 %
Neutro Abs: 2 10*3/uL (ref 1.7–7.7)
Neutrophils Relative %: 71 %
Platelet Count: 48 10*3/uL — ABNORMAL LOW (ref 150–400)
RBC: 3.14 MIL/uL — ABNORMAL LOW (ref 4.22–5.81)
RDW: 14.6 % (ref 11.5–15.5)
WBC Count: 2.8 10*3/uL — ABNORMAL LOW (ref 4.0–10.5)
nRBC: 0 % (ref 0.0–0.2)

## 2022-10-11 LAB — SAMPLE TO BLOOD BANK

## 2022-10-11 LAB — FERRITIN: Ferritin: 90 ng/mL (ref 24–336)

## 2022-10-11 NOTE — Progress Notes (Signed)
[facility] OFFICE PROGRESS NOTE   Diagnosis: Cirrhosis, anemia, dementia, history of HCC  INTERVAL HISTORY:   [name] returns as scheduled.  He is here with a caretaker.  He has no specific complaint.  Objective:  Vital signs in last 24 hours:  Blood pressure 129/66, pulse 88, temperature 98.1 F (36.7 C), temperature source Tympanic, resp. rate 18, weight 175 lb 3.2 oz (79.5 kg), SpO2 100 %.     Resp: Difficulty following instructions to take a deep breath, slight decrease in breath sounds at the right compared to the left chest, no respiratory distress Cardio: Regular rate and rhythm GI: No apparent ascites, no hepatosplenomegaly, nontender Vascular: No leg edema   Lab Results:  Lab Results  Component Value Date   WBC 2.8 (L) 10/11/2022   HGB 10.3 (L) 10/11/2022   HCT 31.9 (L) 10/11/2022   MCV 101.6 (H) 10/11/2022   PLT 48 (L) 10/11/2022   NEUTROABS 2.0 10/11/2022    CMP  Lab Results  Component Value Date   NA 138 10/11/2021   K 4.5 10/11/2021   CL 109 10/11/2021   CO2 23 10/11/2021   GLUCOSE 208 (H) 10/11/2021   BUN 16 10/11/2021   CREATININE 1.10 10/11/2021   CALCIUM 8.7 (L) 10/11/2021   PROT 6.1 (L) 10/11/2021   ALBUMIN 2.9 (L) 10/11/2021   AST 27 10/11/2021   ALT 22 10/11/2021   ALKPHOS 101 10/11/2021   BILITOT 1.1 10/11/2021   GFRNONAA >60 10/11/2021   GFRAA 75 09/01/2020    Lab Results  Component Value Date   CEA 2.7 01/16/2013    Medications: I have reviewed the patient's current medications.   Assessment/Plan:  Hepatocellular carcinoma, stage I (T1 NX) status post partial left hepatectomy 03/02/2013. 08/31/2013 AFP 2.4.   CT abdomen 08/31/2013 with postoperative changes of partial hepatectomy with small postoperative fluid collection along the resection margin. No definite signs to suggest residual or locally recurrent disease. Several borderline enlarged and minimally enlarged lymph nodes superior to the liver in  the juxtapericardiac fat of the lower anterior mediastinum with the largest measuring 11 mm slightly increased compared to the prior study 01/15/2013. Several prominent non-pathologically enlarged upper abdominal ligament lymph nodes similar to the prior study. Small esophageal varices.   CT abdomen 03/03/2012 without evidence of recurrent hepatocellular carcinoma, stable right lung base nodule CT the abdomen and pelvis 09/06/2014 without evidence of recurrent hepatocellular carcinoma, slight enlargement of pre-cardiac lymph nodes CT abdomen/pelvis 09/07/2015 with no findings for recurrent tumor or metastatic disease. Stable small 3 mm right middle lobe pulmonary nodule since 2014. Epicardial lymph node measured 11 mm, previously 13 mm. Stable cirrhotic changes. Fairly significant esophageal varices. 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 Hepatitis B core antibody positive. Diabetes. Severe microcytic anemia-likely iron deficiency anemia-progressive 06/03/2017. Stool Hemoccult positive 3 06/05/2017. Colonoscopy 12/27/2016-external and internal hemorrhoids. Diverticulosis in the sigmoid, descending and transverse colon. One medium polyp in the distal descending colon. 3 small polyps in the transverse colon. 2 diminutive polyps in the rectum and the proximal ascending colon. Medium-sized lipoma transverse colon. Small lipoma mid ascending colon. Multiple nonbleeding colonic  angiodysplastic lesions. Pathology-tubular adenomas,  hyperplastic polyps. Pancytopenia secondary to cirrhosis and GI bleeding Laparoscopic cholecystectomy 01/28/2018 Decreased iron stores 10/15/2018; transfused 2 units of blood 10/18/2018 and 06/01/2020; Feraheme 11/04/2018 and 11/11/2018; Feraheme 02/18/2019 and 02/25/2019, 02/15/2020, 02/29/2020, 06/10/2020, 06/17/2020, 09/21/2020, 09/28/2020, 01/02/2021, 10/11/2021, 10/18/2021, 01/12/2022 and 01/18/2022 Dementia      Disposition: Mr [name] has a history of cirrhosis and HCC.  He is in clinical remission from the [facility].  He has pancytopenia secondary to cirrhosis.  He has anemia secondary to chronic GI bleeding.  The hemoglobin is stable today.  Will follow-up on the ferritin level.  He return for an office and lab visit in 3 months.  Mr [name] has advanced dementia.  He now resides in a memory care facility.  Betsy Coder, MD  10/11/2022  3:00 PM

## 2023-01-09 ENCOUNTER — Inpatient Hospital Stay: Payer: Medicare Other

## 2023-01-09 ENCOUNTER — Inpatient Hospital Stay: Payer: Medicare Other | Admitting: Oncology

## 2023-01-18 ENCOUNTER — Encounter: Payer: Self-pay | Admitting: Nurse Practitioner

## 2023-01-18 ENCOUNTER — Inpatient Hospital Stay (HOSPITAL_BASED_OUTPATIENT_CLINIC_OR_DEPARTMENT_OTHER): Payer: Medicare Other | Admitting: Nurse Practitioner

## 2023-01-18 ENCOUNTER — Inpatient Hospital Stay: Payer: Medicare Other | Attending: Oncology

## 2023-01-18 VITALS — BP 136/98 | HR 75 | Temp 98.1°F | Resp 18 | Wt 178.7 lb

## 2023-01-18 DIAGNOSIS — I8510 Secondary esophageal varices without bleeding: Secondary | ICD-10-CM | POA: Insufficient documentation

## 2023-01-18 DIAGNOSIS — D500 Iron deficiency anemia secondary to blood loss (chronic): Secondary | ICD-10-CM

## 2023-01-18 DIAGNOSIS — Z79899 Other long term (current) drug therapy: Secondary | ICD-10-CM | POA: Insufficient documentation

## 2023-01-18 DIAGNOSIS — F0390 Unspecified dementia without behavioral disturbance: Secondary | ICD-10-CM | POA: Insufficient documentation

## 2023-01-18 DIAGNOSIS — D61818 Other pancytopenia: Secondary | ICD-10-CM | POA: Insufficient documentation

## 2023-01-18 DIAGNOSIS — K5730 Diverticulosis of large intestine without perforation or abscess without bleeding: Secondary | ICD-10-CM | POA: Insufficient documentation

## 2023-01-18 DIAGNOSIS — D649 Anemia, unspecified: Secondary | ICD-10-CM | POA: Diagnosis not present

## 2023-01-18 DIAGNOSIS — K648 Other hemorrhoids: Secondary | ICD-10-CM | POA: Insufficient documentation

## 2023-01-18 DIAGNOSIS — K644 Residual hemorrhoidal skin tags: Secondary | ICD-10-CM | POA: Insufficient documentation

## 2023-01-18 DIAGNOSIS — K5520 Angiodysplasia of colon without hemorrhage: Secondary | ICD-10-CM | POA: Insufficient documentation

## 2023-01-18 DIAGNOSIS — Z8505 Personal history of malignant neoplasm of liver: Secondary | ICD-10-CM | POA: Insufficient documentation

## 2023-01-18 DIAGNOSIS — C220 Liver cell carcinoma: Secondary | ICD-10-CM

## 2023-01-18 DIAGNOSIS — D1779 Benign lipomatous neoplasm of other sites: Secondary | ICD-10-CM | POA: Diagnosis not present

## 2023-01-18 DIAGNOSIS — E119 Type 2 diabetes mellitus without complications: Secondary | ICD-10-CM | POA: Insufficient documentation

## 2023-01-18 DIAGNOSIS — K7460 Unspecified cirrhosis of liver: Secondary | ICD-10-CM | POA: Diagnosis not present

## 2023-01-18 DIAGNOSIS — K621 Rectal polyp: Secondary | ICD-10-CM | POA: Insufficient documentation

## 2023-01-18 LAB — CBC WITH DIFFERENTIAL (CANCER CENTER ONLY)
Abs Immature Granulocytes: 0 10*3/uL (ref 0.00–0.07)
Basophils Absolute: 0 10*3/uL (ref 0.0–0.1)
Basophils Relative: 0 %
Eosinophils Absolute: 0 10*3/uL (ref 0.0–0.5)
Eosinophils Relative: 2 %
HCT: 32.5 % — ABNORMAL LOW (ref 39.0–52.0)
Hemoglobin: 10.4 g/dL — ABNORMAL LOW (ref 13.0–17.0)
Immature Granulocytes: 0 %
Lymphocytes Relative: 21 %
Lymphs Abs: 0.5 10*3/uL — ABNORMAL LOW (ref 0.7–4.0)
MCH: 32.6 pg (ref 26.0–34.0)
MCHC: 32 g/dL (ref 30.0–36.0)
MCV: 101.9 fL — ABNORMAL HIGH (ref 80.0–100.0)
Monocytes Absolute: 0.2 10*3/uL (ref 0.1–1.0)
Monocytes Relative: 10 %
Neutro Abs: 1.6 10*3/uL — ABNORMAL LOW (ref 1.7–7.7)
Neutrophils Relative %: 67 %
Platelet Count: 55 10*3/uL — ABNORMAL LOW (ref 150–400)
RBC: 3.19 MIL/uL — ABNORMAL LOW (ref 4.22–5.81)
RDW: 14.9 % (ref 11.5–15.5)
WBC Count: 2.3 10*3/uL — ABNORMAL LOW (ref 4.0–10.5)
nRBC: 0 % (ref 0.0–0.2)

## 2023-01-18 LAB — SAMPLE TO BLOOD BANK

## 2023-01-18 LAB — FERRITIN: Ferritin: 62 ng/mL (ref 24–336)

## 2023-01-18 NOTE — Progress Notes (Signed)
[facility] OFFICE PROGRESS NOTE   Diagnosis: Cirrhosis, anemia, history of HCC, dementia  INTERVAL HISTORY:   [name] returns as scheduled.  He is accompanied by a facility employee.  He denies bleeding.  No fever.  He denies pain.  Appetite is good.  Objective:  Vital signs in last 24 hours:  Blood pressure (!) 136/98, pulse 75, temperature 98.1 F (36.7 C), temperature source Oral, resp. rate 18, weight 178 lb 11.2 oz (81.1 kg), SpO2 100 %.    Lymphatics: No palpable cervical or supraclavicular lymph nodes. Resp: Lungs clear bilaterally. Cardio: Regular rate and rhythm. GI: No hepatosplenomegaly. Vascular: No leg edema.   Lab Results:  Lab Results  Component Value Date   WBC 2.3 (L) 01/18/2023   HGB 10.4 (L) 01/18/2023   HCT 32.5 (L) 01/18/2023   MCV 101.9 (H) 01/18/2023   PLT 55 (L) 01/18/2023   NEUTROABS 1.6 (L) 01/18/2023    Imaging:  No results found.  Medications: I have reviewed the patient's current medications.  Assessment/Plan: Hepatocellular carcinoma, stage I (T1 NX) status post partial left hepatectomy 03/02/2013. 08/31/2013 AFP 2.4.   CT abdomen 08/31/2013 with postoperative changes of partial hepatectomy with small postoperative fluid collection along the resection margin. No definite signs to suggest residual or locally recurrent disease. Several borderline enlarged and minimally enlarged lymph nodes superior to the liver in the juxtapericardiac fat of the lower anterior mediastinum with the largest measuring 11 mm slightly increased compared to the prior study 01/15/2013. Several prominent non-pathologically enlarged upper abdominal ligament lymph nodes similar to the prior study. Small esophageal varices.   CT abdomen 03/03/2012 without evidence of recurrent hepatocellular carcinoma, stable right lung base nodule CT the abdomen and pelvis 09/06/2014 without evidence of recurrent hepatocellular carcinoma, slight enlargement of  pre-cardiac lymph nodes CT abdomen/pelvis 09/07/2015 with no findings for recurrent tumor or metastatic disease. Stable small 3 mm right middle lobe pulmonary nodule since 2014. Epicardial lymph node measured 11 mm, previously 13 mm. Stable cirrhotic changes. Fairly significant esophageal varices. 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 Hepatitis B core antibody positive. Diabetes. Severe microcytic anemia-likely iron deficiency anemia-progressive 06/03/2017. Stool Hemoccult positive 3 06/05/2017. Colonoscopy 12/27/2016-external and internal hemorrhoids. Diverticulosis in the sigmoid, descending and transverse colon. One medium polyp in the distal descending colon. 3 small polyps in the transverse colon. 2 diminutive polyps in the rectum and the proximal ascending colon. Medium-sized lipoma transverse colon. Small lipoma mid ascending colon. Multiple nonbleeding colonic angiodysplastic lesions. Pathology-tubular adenomas, hyperplastic polyps. Pancytopenia secondary to cirrhosis and GI bleeding Laparoscopic cholecystectomy 01/28/2018 Decreased iron stores 10/15/2018; transfused 2 units of blood 10/18/2018 and 06/01/2020; Feraheme 11/04/2018 and 11/11/2018; Feraheme 02/18/2019 and 02/25/2019, 02/15/2020, 02/29/2020, 06/10/2020, 06/17/2020, 09/21/2020, 09/28/2020, 01/02/2021, 10/11/2021, 10/18/2021, 01/12/2022 and 01/18/2022 Dementia    Disposition: [name] appears unchanged.  He remains in  clinical remission from [facility].  He has persistent stable pancytopenia secondary to cirrhosis.  He has anemia secondary to chronic GI bleeding.  Hemoglobin remains stable.  He will return for lab and follow-up in 3 months.    Lonna Cobb ANP/GNP-BC   01/18/2023  2:38 PM

## 2023-04-19 ENCOUNTER — Inpatient Hospital Stay: Payer: Medicare Other | Admitting: Nurse Practitioner

## 2023-04-19 ENCOUNTER — Inpatient Hospital Stay: Payer: Medicare Other

## 2023-04-21 DEATH — deceased
